# Patient Record
Sex: Male | Born: 1955
Health system: Southern US, Community
[De-identification: ages and names within clinical notes are randomized; demographics above are authoritative.]

## PROBLEM LIST (undated history)

## (undated) DIAGNOSIS — T792XXA Traumatic secondary and recurrent hemorrhage and seroma, initial encounter: Secondary | ICD-10-CM

## (undated) DIAGNOSIS — I714 Abdominal aortic aneurysm, without rupture, unspecified: Secondary | ICD-10-CM

## (undated) DIAGNOSIS — Z9889 Other specified postprocedural states: Secondary | ICD-10-CM

## (undated) DIAGNOSIS — I10 Essential (primary) hypertension: Secondary | ICD-10-CM

## (undated) HISTORY — DX: Essential (primary) hypertension: I10

## (undated) HISTORY — PX: CORONARY ARTERY BYPASS GRAFT: SHX141

## (undated) HISTORY — DX: Abdominal aortic aneurysm, without rupture: I71.4

## (undated) HISTORY — DX: Abdominal aortic aneurysm, without rupture, unspecified: I71.40

## (undated) HISTORY — PX: ABDOMINAL AORTIC ANEURYSM REPAIR: SUR1152

---

## 2019-06-18 ENCOUNTER — Emergency Department (HOSPITAL_COMMUNITY): Payer: Medicaid Other

## 2019-06-18 ENCOUNTER — Emergency Department (HOSPITAL_COMMUNITY)
Admission: EM | Admit: 2019-06-18 | Discharge: 2019-06-18 | Disposition: A | Discharge status: Home / Self Care | Payer: Medicaid Other | Attending: Emergency Medicine | Admitting: Emergency Medicine

## 2019-06-18 ENCOUNTER — Encounter (HOSPITAL_COMMUNITY): Payer: Self-pay

## 2019-06-18 ENCOUNTER — Other Ambulatory Visit: Payer: Self-pay

## 2019-06-18 DIAGNOSIS — R0789 Other chest pain: Secondary | ICD-10-CM | POA: Diagnosis present

## 2019-06-18 DIAGNOSIS — Z5321 Procedure and treatment not carried out due to patient leaving prior to being seen by health care provider: Secondary | ICD-10-CM | POA: Diagnosis not present

## 2019-06-18 HISTORY — DX: Other specified postprocedural states: Z98.890

## 2019-06-18 HISTORY — DX: Traumatic secondary and recurrent hemorrhage and seroma, initial encounter: T79.2XXA

## 2019-06-18 MED ORDER — SODIUM CHLORIDE 0.9% FLUSH: 3.0000 mL | Freq: Once | INTRAVENOUS | Status: DC

## 2019-06-18 NOTE — ED Triage Notes (Signed)
Pt here for chest pain and SOB that has been happening for the last few weeks. Pt is from [place] and recently moved here. Pt had an open heart surgery 10/10/2018

## 2019-06-18 NOTE — ED Notes (Signed)
Have advised pt that he is advised to stay and get checked out. States he must go take care of his family. Notified him he was next to go back and that we were cleaning his room. States he will come back later

## 2019-06-19 ENCOUNTER — Other Ambulatory Visit: Payer: Self-pay

## 2019-06-19 ENCOUNTER — Encounter (HOSPITAL_COMMUNITY): Payer: Self-pay

## 2019-06-19 ENCOUNTER — Emergency Department (HOSPITAL_COMMUNITY)
Admission: EM | Admit: 2019-06-19 | Discharge: 2019-06-19 | Disposition: A | Discharge status: Home / Self Care | Payer: Medicaid Other | Attending: Emergency Medicine | Admitting: Emergency Medicine

## 2019-06-19 ENCOUNTER — Emergency Department (HOSPITAL_COMMUNITY): Payer: Medicaid Other

## 2019-06-19 DIAGNOSIS — F17210 Nicotine dependence, cigarettes, uncomplicated: Secondary | ICD-10-CM | POA: Diagnosis not present

## 2019-06-19 DIAGNOSIS — R0602 Shortness of breath: Secondary | ICD-10-CM | POA: Insufficient documentation

## 2019-06-19 DIAGNOSIS — E876 Hypokalemia: Secondary | ICD-10-CM

## 2019-06-19 DIAGNOSIS — R079 Chest pain, unspecified: Secondary | ICD-10-CM | POA: Diagnosis not present

## 2019-06-19 LAB — CBC
HCT: 35.7 % — ABNORMAL LOW (ref 39.0–52.0)
Hemoglobin: 10.8 g/dL — ABNORMAL LOW (ref 13.0–17.0)
MCH: 29.3 pg (ref 26.0–34.0)
MCHC: 30.3 g/dL (ref 30.0–36.0)
MCV: 97 fL (ref 80.0–100.0)
Platelets: 202 10*3/uL (ref 150–400)
RBC: 3.68 MIL/uL — ABNORMAL LOW (ref 4.22–5.81)
RDW: 14.5 % (ref 11.5–15.5)
WBC: 6 10*3/uL (ref 4.0–10.5)
nRBC: 0 % (ref 0.0–0.2)

## 2019-06-19 LAB — BASIC METABOLIC PANEL
Anion gap: 9 (ref 5–15)
BUN: 16 mg/dL (ref 8–23)
CO2: 26 mmol/L (ref 22–32)
Calcium: 8.6 mg/dL — ABNORMAL LOW (ref 8.9–10.3)
Chloride: 105 mmol/L (ref 98–111)
Creatinine, Ser: 1.13 mg/dL (ref 0.61–1.24)
GFR calc Af Amer: >60 mL/min (ref >60)
GFR calc non Af Amer: >60 mL/min (ref >60)
Glucose, Bld: 101 mg/dL — ABNORMAL HIGH (ref 70–99)
Potassium: 3.1 mmol/L — ABNORMAL LOW (ref 3.5–5.1)
Sodium: 140 mmol/L (ref 135–145)

## 2019-06-19 LAB — TROPONIN I (HIGH SENSITIVITY)
Troponin I (High Sensitivity): 11 ng/L (ref <18)
Troponin I (High Sensitivity): 11 ng/L (ref <18)

## 2019-06-19 LAB — BRAIN NATRIURETIC PEPTIDE: B Natriuretic Peptide: 93 pg/mL (ref 0.0–100.0)

## 2019-06-19 LAB — MAGNESIUM: Magnesium: 2 mg/dL (ref 1.7–2.4)

## 2019-06-19 MED ORDER — POTASSIUM CHLORIDE CRYS ER 20 MEQ PO TBCR: 20.0000 meq | EXTENDED_RELEASE_TABLET | Freq: Every day | ORAL | 0 refills | Status: DC

## 2019-06-19 MED ORDER — ASPIRIN 81 MG PO CHEW
324.0000 mg | CHEWABLE_TABLET | Freq: Once | ORAL | Status: AC
  Administered 2019-06-19: 324 mg via ORAL
  Filled 2019-06-19: qty 4

## 2019-06-19 MED ORDER — SODIUM CHLORIDE 0.9% FLUSH: 3.0000 mL | Freq: Once | INTRAVENOUS | Status: DC

## 2019-06-19 MED ORDER — IOHEXOL 350 MG/ML SOLN
100.0000 mL | Freq: Once | INTRAVENOUS | Status: AC | PRN
  Administered 2019-06-19: 100 mL via INTRAVENOUS

## 2019-06-19 MED ORDER — POTASSIUM CHLORIDE CRYS ER 20 MEQ PO TBCR
40.0000 meq | EXTENDED_RELEASE_TABLET | Freq: Once | ORAL | Status: AC
  Administered 2019-06-19: 40 meq via ORAL
  Filled 2019-06-19: qty 2

## 2019-06-19 NOTE — ED Notes (Signed)
At  [facility] with someone in the Medical Records at [facility] in [place] an requested records from June/July 2020.

## 2019-06-19 NOTE — Discharge Instructions (Addendum)
Follow-up closely with cardiologist.  Return for worsening or persistent symptoms.  Take your medications as directed including potassium.

## 2019-06-19 NOTE — ED Notes (Signed)
Patient refuses COVID/SARS testing nasal swab.

## 2019-06-19 NOTE — ED Provider Notes (Signed)
[facility] EMERGENCY DEPARTMENT Provider Note   CSN: [id number] Arrival date & time: 06/19/19  8295     History   Chief Complaint Chief Complaint  Patient presents with   Chest Pain    HPI Connor [name] is a 63 y.o. male.     Patient with history of high blood pressure, aortic dissection last surgery in February at [facility], cigarette smoker presents with intermittent chest tightness and shortness of breath past 3 to 4 weeks.  Last episode was yesterday morning lasting approximately 2 hours.  Not specifically exertional.  At times worse with movement.  Not as significant as previous when he had a dissection.  Currently no significant symptoms.  Patient has history of renal disease when he was admitted/surgery.  Patient concerned potassium may be low as he ran out of his potassium medications.  Patient says has been taking his blood pressure medications.  No fever chills or cough.  No blood clot history no recent surgery, no unilateral leg swelling.  Patient has mild swelling both feet when he stands prolonged time.     Past Medical History:  Diagnosis Date   History of open heart surgery    Seroma due to trauma [facility])     There are no active problems to display for this patient.   History reviewed. No pertinent surgical history.      Home Medications    Prior to Admission medications   Medication Sig Start Date End Date Taking? Authorizing Provider  potassium chloride SA (KLOR-CON) 20 MEQ tablet Take 1 tablet (20 mEq total) by mouth daily. 06/19/19   Blane Ohara, MD    Family History No family history on file.  Social History Social History   Tobacco Use   Smoking status: Current Some Day Smoker   Smokeless tobacco: Never Used  Substance Use Topics   Alcohol use: Never    Frequency: Never   Drug use: Never     Allergies   Patient has no known allergies.   Review of Systems Review of Systems  Constitutional:  Negative for chills and fever.  HENT: Negative for congestion.   Eyes: Negative for visual disturbance.  Respiratory: Positive for shortness of breath. Negative for cough.   Cardiovascular: Positive for chest pain.  Gastrointestinal: Negative for abdominal pain and vomiting.  Genitourinary: Negative for dysuria and flank pain.  Musculoskeletal: Negative for back pain, neck pain and neck stiffness.  Skin: Negative for rash.  Neurological: Negative for light-headedness and headaches.     Physical Exam Updated Vital Signs BP (!) 152/91    Pulse 70    Temp 98.1 F (36.7 C) (Oral)    Resp 18    Ht  (1.803 m)    Wt 106 kg    SpO2 95%    BMI 32.59 kg/m   Physical Exam Vitals signs and nursing note reviewed.  Constitutional:      Appearance: He is well-developed.  HENT:     Head: Normocephalic and atraumatic.  Eyes:     General:        Right eye: No discharge.        Left eye: No discharge.     Conjunctiva/sclera: Conjunctivae normal.  Neck:     Musculoskeletal: Normal range of motion and neck supple.     Trachea: No tracheal deviation.  Cardiovascular:     Rate and Rhythm: Normal rate and regular rhythm.     Pulses:  Radial pulses are 2+ on the right side and 2+ on the left side.     Heart sounds: Murmur present. Systolic (aortic region) murmur present with a grade of 2/6.  Pulmonary:     Effort: Pulmonary effort is normal. No tachypnea.     Breath sounds: Normal breath sounds. No decreased breath sounds.  Abdominal:     General: There is no distension.     Palpations: Abdomen is soft.     Tenderness: There is no abdominal tenderness. There is no guarding.  Musculoskeletal:     Right lower leg: No edema.     Left lower leg: No edema.  Skin:    General: Skin is warm.     Findings: No rash.  Neurological:     Mental Status: He is alert and oriented to person, place, and time.      ED Treatments / Results  Labs (all labs ordered are listed, but only  abnormal results are displayed) Labs Reviewed  BASIC METABOLIC PANEL - Abnormal; Notable for the following components:      Result Value   Potassium 3.1 (*)    Glucose, Bld 101 (*)    Calcium 8.6 (*)    All other components within normal limits  CBC - Abnormal; Notable for the following components:   RBC 3.68 (*)    Hemoglobin 10.8 (*)    HCT 35.7 (*)    All other components within normal limits  SARS CORONAVIRUS 2 (TAT 6-24 HRS)  MAGNESIUM  BRAIN NATRIURETIC PEPTIDE  TROPONIN I (HIGH SENSITIVITY)  TROPONIN I (HIGH SENSITIVITY)    EKG None EKG reviewed heart rate 80, incomplete right bundle branch block, normal QT, sinus.  No acute ST elevation. Radiology Dg Chest 2 View  Result Date: 06/18/2019 CLINICAL DATA:  Chest pain EXAM: CHEST - 2 VIEW COMPARISON:  None. FINDINGS: No prior studies are available for comparison. Post sternotomy changes. Vascular stent in the region of aortic arch. 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 Clips in the right axillary region. Mild cardiomegaly. No pleural effusion, pneumothorax or focal airspace disease IMPRESSION: 1. Postsurgical changes of the mediastinum. 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 Given history of chest pain and absence of prior exams for comparison, recommend CT angiography for further evaluation. 2. Cardiomegaly with vascular congestion. Electronically Signed   By: Jasmine PangKim  Fujinaga M.D.   On: 06/18/2019 17:26   Dg Chest Portable 1 View  Result Date: 06/19/2019 CLINICAL DATA:  Chest pain. EXAM: PORTABLE CHEST 1 VIEW COMPARISON:  June 18, 2019. FINDINGS: Stable cardiomegaly. Sternotomy wires are noted. Vascular stent is again noted in region of aortic arch. No pneumothorax or pleural effusion is noted. No acute pulmonary disease is noted. Bony thorax is unremarkable. IMPRESSION: Postsurgical changes as described above. Stable cardiomegaly. No acute cardiopulmonary  abnormality seen. Electronically Signed   By: Lupita RaiderJames  Green Jr M.D.   On: 06/19/2019 08:13   Ct Angio Chest/abd/pel For Dissection W And/or Wo Contrast  Result Date: 06/19/2019 CLINICAL DATA:  History of aortic dissection surgical repair. Chest pain and shortness of breath intermittently last month. EXAM: CT ANGIOGRAPHY CHEST, ABDOMEN AND PELVIS TECHNIQUE: Multidetector CT imaging through the chest, abdomen and pelvis was performed using the standard protocol during bolus administration of intravenous contrast. Multiplanar reconstructed images and MIPs were obtained and reviewed to evaluate the vascular anatomy. CONTRAST:  100mL OMNIPAQUE IOHEXOL 350 MG/ML SOLN COMPARISON:  None. FINDINGS: CTA CHEST FINDINGS Cardiovascular:  Patient is status post complex aortic dissection repair. Specifically, the patient has undergone straight graft repair of the ascending aorta. Postop changes noted about the graft along the ascending aorta without evidence of acute mediastinal hemorrhage, hematoma, or pericardial effusion. Normal heart size. Three-vessel arch anatomy is tortuous but appears patent. Proximal descending thoracic aorta distal to the left subclavian artery has a stent graft extending over a short segment. The stent graft appears patent with a trace amount linear hypodense filling defect along the proximal stent tines, images 27 through 31. This is indeterminate for a small residual intimal dissection versus minor amount of strandy linear thrombus. The adjacent false lumen along the stent graft is largely thrombosed. Inferior to the stent graft segment, the residual dissection extends from the mid descending thoracic aorta into the abdomen. Both the true and false lumens have patency. The true lumen is smaller and medial. This extends through the diaphragmatic hiatus into the abdominal aorta. Mediastinum/Nodes: No supraclavicular or axillary adenopathy. Mildly enlarged pretracheal lymph nodes, nonspecific. No  subcarinal or bulky hilar adenopathy. Thyroid, trachea and esophagus unremarkable. Esophagus is nondilated. No hiatal hernia. Lungs/Pleura: Minor dependent basilar atelectasis. No focal pneumonia, collapse or consolidation. No interstitial process or edema. No other pleural abnormality, pleural effusion or pneumothorax. Trachea central airways remain patent. Musculoskeletal: Postop changes of the right anterior chest with asymmetry/atrophy of the right pectoralis musculature. Median sternotomy changes noted. Symmetric gynecomastia evident. Minor degenerative changes of the spine. No acute thoracic spine finding. Review of the MIP images confirms the above findings. CTA ABDOMEN AND PELVIS FINDINGS VASCULAR Aorta: Residual type B dissection involves the entire abdominal aorta to the bifurcation. True and false lumens remain patent. Smaller lumen is the true lumen. No acute occlusive process, thrombus, retroperitoneal hemorrhage or hematoma. No evidence of rupture. Celiac: Dissection extends into the celiac origin. Celiac remains patent including its branches. SMA: Dissection extends into the SMA origin. SMA remains patent including its branches. Renals: Patent right renal artery off of the true lumen. Left renal artery elongated stent extends from the smaller true lumen through the false lumen into the left renal artery origin and appears patent. No perfusion abnormality or renal hypoperfusion demonstrated. IMA: IMA origin is patent off of the false lumen. IMA branches are not well visualized distally. Inflow: Patent stents of the left common iliac artery extending off the true lumen of the aorta. There is a patent left external iliac stent as well. Dissection extends into the left internal iliac artery which remains patent. No iliac inflow disease, occlusion, thrombus or acute vascular finding. Veins: Dedicated venous imaging not performed. Review of the MIP images confirms the above findings. NON-VASCULAR  Hepatobiliary: No large focal hepatic abnormality. Gallbladder is collapsed but contains small calcified stones. No biliary dilatation or obstruction. Common bile duct nondilated. Pancreas: Unremarkable. No pancreatic ductal dilatation or surrounding inflammatory changes. Spleen: Normal in size without focal abnormality. Adrenals/Urinary Tract: Symmetric adrenal glands. No hydronephrosis or acute obstructive uropathy. No hydroureter or ureteral calculus. Bladder unremarkable. Stomach/Bowel: Negative for bowel obstruction, significant dilatation, ileus, or free air. Scattered minor colonic diverticulosis. Appendix. No free fluid, fluid collection, hemorrhage, hematoma, or abscess. Lymphatic: No bulky adenopathy. Reproductive: Mild prostate enlargement. Other: Intact abdominal wall. Postop changes of the inguinal regions bilaterally extensive scarring left inguinal region and prominent mildly enlarged lymph nodes. Musculoskeletal: Minor degenerative changes. No acute osseous finding. No compression fracture. Review of the MIP images confirms the above findings. IMPRESSION: Complex operative aortic dissection repair with a patent straight  graft of the ascending aortic component. Patient also has a patent stent graft of the proximal descending thoracic aorta with a small linear intra stent graft lumen strandy soft tissue density compatible with minor residual intimal flap versus strandy nonocclusive thrombus. There is thrombosis of the proximal descending thoracic aortic dissection false lumen along the stent graft. There is a patent residual type B aortic dissection of the mid descending thoracic aorta extending to the abdominal aortic bifurcation. No definitive evidence of acute vascular process or injury by CTA. Preserved patency of the mesenteric and renal vasculature despite being involved by the residual type B dissection. No evidence of aortoiliac acute vascular occlusive process. No other acute intrathoracic or  abdominopelvic finding by CTA. These results were called by telephone at the time of interpretation on 06/19/2019 at 11 a.m. to provider Elnora Morrison , who verbally acknowledged these results. Electronically Signed   By: Jerilynn Mages.  Shick M.D.   On: 06/19/2019 12:07    Procedures Procedures (including critical care time)  Medications Ordered in ED Medications  sodium chloride flush (NS) 0.9 % injection 3 mL (has no administration in time range)  iohexol (OMNIPAQUE) 350 MG/ML injection 100 mL (100 mLs Intravenous Contrast Given 06/19/19 1030)  potassium chloride SA (KLOR-CON) CR tablet 40 mEq (40 mEq Oral Given 06/19/19 1139)  aspirin chewable tablet 324 mg (324 mg Oral Given 06/19/19 1139)     Initial Impression / Assessment and Plan / ED Course  I have reviewed the triage vital signs and the nursing notes.  Pertinent labs & imaging results that were available during my care of the patient were reviewed by me and considered in my medical decision making (see chart for details).       Patient presents with intermittent chest tightness and shortness of breath with aortic dissection history.  Patient does have risk for triggers for ACS and also known dissection.  Plan for blood work to check potassium and general blood work especially with patient being on multiple blood pressure medications and missing potassium recently.  Plan check troponin with intermittent chest tightness given his age, high blood pressure history and smoking history.  Currently no significant symptoms.  Shortness of breath corona test added however patient refused.  Patient has history of renal disease we will see what his kidney function is to determine if we can do a CT angiogram today.  Troponin test negative.  Potassium mild low 3.1, oral replacement given.  Patient well-appearing on reassessment no chest pain or shortness of breath.  Blood pressure mild elevated.  Discussed CT results with radiology and no acute findings of  his old dissection repair.  Discussed with cardiology who will see the patient on Wednesday for appointment.  Patient comfortable this plan and reasons to return given.  Patient is on aspirin daily.   Final Clinical Impressions(s) / ED Diagnoses   Final diagnoses:  Acute chest pain  Hypokalemia    ED Discharge Orders         Ordered    potassium chloride SA (KLOR-CON) 20 MEQ tablet  Daily     06/19/19 1230           Elnora Morrison, MD 06/19/19 1233

## 2019-06-19 NOTE — ED Notes (Signed)
Jarrett Soho with Cardiology called with appt. For Wed. Oct. 28th at 0940 with Dr. Harl Bowie. Info given to Dr. Reather Converse.

## 2019-06-19 NOTE — ED Triage Notes (Signed)
Pt reports intermittent chest pain and sob x 1 month.  Denies symptoms at this time.

## 2019-06-24 ENCOUNTER — Ambulatory Visit: Payer: Medicaid Other | Admitting: Cardiology

## 2019-06-24 NOTE — Progress Notes (Deleted)
Clinical Summary Mr. Dieujuste is a 63 y.o.male seen as new patient today for chest pain  1. Chest pain - ER visit 05/2019 with chest pain - trop neg x 2 with no delta. EKG SR no specific ischemic changes - CTA shows prior complex repair as reported below but no acute pathology. - 10/2018 echo LVEF 60-65%   2. History of aortic dissection - records indicated he had a type A dissection wit repair in 2016, admitted 07/2018 with recurrent dissection at the anastamosis suture line later requiring total arch repair.  - prior surgery at [facility] in [facility]  3. PAD - history of left common and external iliac stents   4. HTN Past Medical History:  Diagnosis Date   History of open heart surgery    Seroma due to trauma ([place])      No Known Allergies   Current Outpatient Medications  Medication Sig Dispense Refill   potassium chloride SA (KLOR-CON) 20 MEQ tablet Take 1 tablet (20 mEq total) by mouth daily. 7 tablet 0   No current facility-administered medications for this visit.      No past surgical history on file.   No Known Allergies    No family history on file.   Social History Mr. Iglesia reports that he has been smoking. He has never used smokeless tobacco. Mr. Troung reports no history of alcohol use.   Review of Systems CONSTITUTIONAL: No weight loss, fever, chills, weakness or fatigue.  HEENT: Eyes: No visual loss, blurred vision, double vision or yellow sclerae.No hearing loss, sneezing, congestion, runny nose or sore throat.  SKIN: No rash or itching.  CARDIOVASCULAR:  RESPIRATORY: No shortness of breath, cough or sputum.  GASTROINTESTINAL: No anorexia, nausea, vomiting or diarrhea. No abdominal pain or blood.  GENITOURINARY: No burning on urination, no polyuria NEUROLOGICAL: No headache, dizziness, syncope, paralysis, ataxia, numbness or tingling in the extremities. No change in bowel or bladder control.  MUSCULOSKELETAL: No muscle,  back pain, joint pain or stiffness.  LYMPHATICS: No enlarged nodes. No history of splenectomy.  PSYCHIATRIC: No history of depression or anxiety.  ENDOCRINOLOGIC: No reports of sweating, cold or heat intolerance. No polyuria or polydipsia.  Marland Kitchen   Physical Examination There were no vitals filed for this visit. There were no vitals filed for this visit.  Gen: resting comfortably, no acute distress HEENT: no scleral icterus, pupils equal round and reactive, no palptable cervical adenopathy,  CV Resp: Clear to auscultation bilaterally GI: abdomen is soft, non-tender, non-distended, normal bowel sounds, no hepatosplenomegaly MSK: extremities are warm, no edema.  Skin: warm, no rash Neuro:  no focal deficits Psych: appropriate affect   Diagnostic Studies   05/2019 CTA chest  IMPRESSION: Complex operative aortic dissection repair with a patent straight graft of the ascending aortic component. Patient also has a patent stent graft of the proximal descending thoracic aorta with a small linear intra stent graft lumen strandy soft tissue density compatible with minor residual intimal flap versus strandy nonocclusive thrombus. There is thrombosis of the proximal descending thoracic aortic dissection false lumen along the stent graft. There is a patent residual type B aortic dissection of the mid descending thoracic aorta extending to the abdominal aortic bifurcation.  No definitive evidence of acute vascular process or injury by CTA.  Preserved patency of the mesenteric and renal vasculature despite being involved by the residual type B dissection. No evidence of aortoiliac acute vascular occlusive process.  No other acute intrathoracic or abdominopelvic  finding by CTA.  These results were called by telephone at the time of interpretation on 06/19/2019 at 11 a.m. to provider Blane Ohara , who verbally  Assessment and Plan        Antoine Poche, M.D., F.A.C.C.

## 2019-07-10 ENCOUNTER — Encounter: Payer: Self-pay | Admitting: Cardiology

## 2019-07-10 NOTE — Telephone Encounter (Signed)
error 

## 2019-07-27 ENCOUNTER — Ambulatory Visit: Payer: Medicaid Other | Admitting: Cardiology

## 2019-08-07 ENCOUNTER — Encounter (INDEPENDENT_AMBULATORY_CARE_PROVIDER_SITE_OTHER): Payer: Self-pay

## 2019-08-07 ENCOUNTER — Other Ambulatory Visit: Payer: Self-pay

## 2019-08-07 ENCOUNTER — Ambulatory Visit: Payer: Medicaid Other | Admitting: Cardiology

## 2019-08-07 VITALS — BP 124/74 | HR 63 | Ht 71.0 in | Wt 236.2 lb

## 2019-08-07 DIAGNOSIS — I480 Paroxysmal atrial fibrillation: Secondary | ICD-10-CM | POA: Diagnosis not present

## 2019-08-07 DIAGNOSIS — I82622 Acute embolism and thrombosis of deep veins of left upper extremity: Secondary | ICD-10-CM

## 2019-08-07 DIAGNOSIS — I10 Essential (primary) hypertension: Secondary | ICD-10-CM

## 2019-08-07 DIAGNOSIS — K0889 Other specified disorders of teeth and supporting structures: Secondary | ICD-10-CM

## 2019-08-07 DIAGNOSIS — I7100 Dissection of unspecified site of aorta: Secondary | ICD-10-CM

## 2019-08-07 MED ORDER — LABETALOL HCL 300 MG PO TABS: 300.0000 mg | ORAL_TABLET | Freq: Two times a day (BID) | ORAL | 3 refills | Status: DC

## 2019-08-07 NOTE — Progress Notes (Signed)
Cardiology Office Note:    Date:  08/07/2019   ID:  Connor [name], DOB [birth year], MRN [id number]  PCP:  Patient, No Pcp Per  Cardiologist:  No primary care provider on file.  Electrophysiologist:  None   Referring MD: No ref. provider found   Chief Complaint  Patient presents with  . Thoracic Aortic Dissection    History of Present Illness:    Connor SequinRobert Sircy is a 63 y.o. 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  He underwent left common and external iliac artery and left renal vein stents on 09/19/2018 in preparation for total aortic arch repair.  Course was complicated by cardiogenic shock (LVEF down to 15%, which has since recovered), AKI requiring CVVH (with subsequent recovery of renal function), extensive left upper extremity DVT.  On 10/03/2018, he underwent total arch replacement, frozen elephant trunk, and head vessel reconstruction with Dr.Takayama and Dr. Allena KatzPatel.  Course was complicated by sternal wound infection, with cultures growing Klebsiella.  He completed a 14-day course of cefadroxil.  He was following closely with cardiology and nephrology in [place] for blood pressure control.  Moved to [place] in July to help with a family issue.   He was in the ED on 06/19/2019 with chest tightness and shortness of breath.  Troponins negative x2.  CTA chest showed patent graft of the ascending aortic component, patent graft of the proximal descending aorta with, thrombosis of the proximal descending thoracic aortic dissection false lumen along the stent graft, patent type B aortic dissection of the mid descending thoracic aorta extending to the abdominal aortic bifurcation.  Reports that he feels chest pain has been related to muscle cramps from low potassium.  Now taking potassium supplements.  States that he also has chest pain across the incision in the right upper chest when he  does stretches.  He reports that he saw his dentist about having a tooth extraction done, but was canceled because he was on Eliquis.     Past Medical History:  Diagnosis Date  . History of open heart surgery   . Seroma due to trauma [facility](HCC)     No past surgical history on file.  Current Medications: Current Meds  Medication Sig  . ASPIRIN LOW DOSE 81 MG EC tablet Take 81 mg by mouth daily.  . cloNIDine (CATAPRES - DOSED IN MG/24 HR) 0.2 mg/24hr patch Place 0.2 mg onto the skin once a week.  . doxazosin (CARDURA) 2 MG tablet Take 2 mg by mouth daily.  Marland Kitchen. ELIQUIS 5 MG TABS tablet Take 5 mg by mouth daily.   Marland Kitchen. NIFEdipine (ADALAT CC) 90 MG 24 hr tablet Take 90 mg by mouth daily.  . potassium chloride SA (KLOR-CON) 20 MEQ tablet Take 40 mEq by mouth daily.  Marland Kitchen. spironolactone (ALDACTONE) 25 MG tablet Take 25 mg by mouth daily.     Allergies:   Patient has no known allergies.   Social History   Socioeconomic History  . Marital status: Single    Spouse name: Not on file  . Number of children: Not on file  . Years of education: Not on file  . Highest education level: Not on file  Occupational History  . Not on file  Tobacco Use  . Smoking status: Current Some Day Smoker  . Smokeless tobacco: Never Used  Substance and Sexual Activity  . Alcohol use: Never  .  Drug use: Never  . Sexual activity: Not on file  Other Topics Concern  . Not on file  Social History Narrative  . Not on file   Social Determinants of Health   Financial Resource Strain:   . Difficulty of Paying Living Expenses: Not on file  Food Insecurity:   . Worried About Charity fundraiser in the Last Year: Not on file  . Ran Out of Food in the Last Year: Not on file  Transportation Needs:   . Lack of Transportation (Medical): Not on file  . Lack of Transportation (Non-Medical): Not on file  Physical Activity:   . Days of Exercise per Week: Not on file  . Minutes of Exercise per Session: Not on file  Stress:    . Feeling of Stress : Not on file  Social Connections:   . Frequency of Communication with Friends and Family: Not on file  . Frequency of Social Gatherings with Friends and Family: Not on file  . Attends Religious Services: Not on file  . Active Member of Clubs or Organizations: Not on file  . Attends Archivist Meetings: Not on file  . Marital Status: Not on file     Family History: Mother died of MI at age 26.  Father had stroke at age 43  ROS:   Please see the history of present illness.     All other systems reviewed and are negative.  EKGs/Labs/Other Studies Reviewed:    The following studies were reviewed today:   EKG:  EKG is  ordered today.  The ekg ordered today demonstrates normal sinus rhythm, first-degree AV block, rate 73, left axis deviation, no ST/T abnormalities  Recent Labs: 06/19/2019: B Natriuretic Peptide 93.0; BUN 16; Creatinine, Ser 1.13; Hemoglobin 10.8; Magnesium 2.0; Platelets 202; Potassium 3.1; Sodium 140  Recent Lipid Panel No results found for: CHOL, TRIG, HDL, CHOLHDL, VLDL, LDLCALC, LDLDIRECT  Physical Exam:    VS:  BP 124/74   Pulse 63   Ht 5\' 11"  (1.803 m)   Wt 236 lb 3.2 oz (107.1 kg)   SpO2 99%   BMI 32.94 kg/m     Wt Readings from Last 3 Encounters:  08/07/19 236 lb 3.2 oz (107.1 kg)  06/19/19 233 lb 11 oz (106 kg)     GEN: Well nourished, well developed in no acute distress HEENT: Normal NECK: No JVD; No carotid bruits LYMPHATICS: No lymphadenopathy CARDIAC: RRR, no murmurs, rubs, gallops.  Chest incisions appear clean, dry, and intact RESPIRATORY:  Clear to auscultation without rales, wheezing or rhonchi  ABDOMEN: Soft, non-tender, non-distended MUSCULOSKELETAL:  No edema; No deformity  SKIN: Warm and dry NEUROLOGIC:  Alert and oriented x 3 PSYCHIATRIC:  Normal affect   ASSESSMENT:    1. Dissection of aorta, unspecified portion of aorta (HCC)   2. Pain, dental   3. PAF (paroxysmal atrial fibrillation) ([place])    4. Deep vein thrombosis (DVT) of left upper extremity, unspecified chronicity, unspecified vein (HCC)   5. Essential hypertension    PLAN:    Aortic dissection: Type A aortic arch dissection status post repair in 2016 with dissection at anastomosis suture lines in 07/2018 status post left common and external iliac artery and left renal vein stents on 09/19/2018 and then total arch replacement, frozen elephant trunk, head vessel reconstruction on 10/03/2018 in [place] multi-staged reconstruction of entire aorta in [place].  Patient requesting a second opinion on surgery, will refer to cardiothoracic surgery -Increase labetalol  to 300 mg twice daily.  Goal SBP less than 120, heart rate less than 60.  Asked patient to monitor his blood pressure and heart rate daily for next 2 weeks and call with results  Hypertension: Currently on doxazosin 2 mg daily, nifedipine 90 mg daily, spironolactone 25 mg daily, and labetalol 200 mg twice daily.  BP above goal less than SBP 120.  Will increase labetalol to 300 mg twice daily as above  PAF: Currently in sinus rhythm.  CHADS-VASc score 2 given HTN, vascular disease.  Continue Eliquis.  He has only been taking 5 mg once a day, advised dosing should be twice daily  LUE DVT: Occurred following surgery in 08/2018.  Has been on Eliquis 5 mg, advised to take twice daily as above  Dental pain: Planning tooth extraction with his dentist, requesting Eliquis to be held.  Tooth extraction can usually be done on anticoagulation, but can be held if needed given has been nearly 1 year since DVT and low CHADS-VASc score for PAF.  Would hold for 24 hours prior to procedure and resume as soon as OK from bleeding standpoint post procedure.  RTC in 1 month  Medication Adjustments/Labs and Tests Ordered: Current medicines are reviewed at length with the patient today.  Concerns regarding medicines are outlined above.  Orders Placed This Encounter  Procedures  .  Ambulatory referral to Cardiothoracic Surgery  . EKG 12-Lead   Meds ordered this encounter  Medications  . labetalol (NORMODYNE) 300 MG tablet    Sig: Take 1 tablet (300 mg total) by mouth 2 (two) times daily.    Dispense:  180 tablet    Refill:  3    Patient Instructions  Medication Instructions:  Your physician has recommended you make the following change in your medication:  1. Increase labetalol one tablet (300 mg ) twice daily, sent in # 90 to requested pharmacy.   *If you need a refill on your cardiac medications before your next appointment, please call your pharmacy*  Lab Work: -None If you have labs (blood work) drawn today and your tests are completely normal, you will receive your results only by: Marland Kitchen MyChart Message (if you have MyChart) OR . A paper copy in the mail If you have any lab test that is abnormal or we need to change your treatment, we will call you to review the results.  Testing/Procedures: -None    Follow-Up:  You have been referred to [facility].   At [facility], you and your health needs are our priority.  As part of our continuing mission to provide you with exceptional heart care, we have created designated Provider Care Teams.  These Care Teams include your primary Cardiologist (physician) and Advanced Practice Providers (APPs -  Physician Assistants and Nurse Practitioners) who all work together to provide you with the care you need, when you need it.  Your next appointment:   1 month(s)  The format for your next appointment:   In Person  Provider:   Epifanio Lesches, MD  Other Instructions Requested medical records from Dr. Dianna Rossetti and [facility].       Signed, Little Ishikawa, MD  08/07/2019 5:24 PM    [facility]

## 2019-08-07 NOTE — Patient Instructions (Signed)
Medication Instructions:  Your physician has recommended you make the following change in your medication:  1. Increase labetalol one tablet (300 mg ) twice daily, sent in # 90 to requested pharmacy.   *If you need a refill on your cardiac medications before your next appointment, please call your pharmacy*  Lab Work: -None If you have labs (blood work) drawn today and your tests are completely normal, you will receive your results only by: Marland Kitchen MyChart Message (if you have MyChart) OR . A paper copy in the mail If you have any lab test that is abnormal or we need to change your treatment, we will call you to review the results.  Testing/Procedures: -None    Follow-Up:  You have been referred to [facility].   At [facility], you and your health needs are our priority.  As part of our continuing mission to provide you with exceptional heart care, we have created designated Provider Care Teams.  These Care Teams include your primary Cardiologist (physician) and Advanced Practice Providers (APPs -  Physician Assistants and Nurse Practitioners) who all work together to provide you with the care you need, when you need it.  Your next appointment:   1 month(s)  The format for your next appointment:   In Person  Provider:   Oswaldo Milian, MD  Other Instructions Requested medical records from Dr. Meryl Crutch and [facility].

## 2019-08-10 ENCOUNTER — Telehealth: Payer: Self-pay

## 2019-08-10 NOTE — Telephone Encounter (Signed)
Attempted to contact pt to inform of MD's recommendations. Left message to call back.

## 2019-08-10 NOTE — Telephone Encounter (Signed)
Opened in error

## 2019-08-10 NOTE — Telephone Encounter (Signed)
-----   Message from Donato Heinz, MD sent at 08/07/2019  5:26 PM EST ----- We received records after patient left.  I tried calling him but unable to reach.  Could we let him know that his Eliquis dose should be 5 mg twice daily (he is only taking once daily).  Also, he is OK to hold his Eliquis for teeth extraction (can hold for 24 hours prior to procedure and resume as soon as OK from bleeding standpoint post-procedure)

## 2019-08-11 MED ORDER — ELIQUIS 5 MG PO TABS: 5.0000 mg | ORAL_TABLET | Freq: Two times a day (BID) | ORAL | 3 refills | Status: DC

## 2019-08-11 NOTE — Telephone Encounter (Signed)
Pt updated and verbalized understanding.  

## 2019-08-17 ENCOUNTER — Other Ambulatory Visit: Payer: Self-pay

## 2019-08-17 ENCOUNTER — Emergency Department (HOSPITAL_COMMUNITY)
Admission: EM | Admit: 2019-08-17 | Discharge: 2019-08-17 | Discharge status: Against Medical Advice | Payer: Medicaid Other

## 2019-08-18 ENCOUNTER — Telehealth: Payer: Self-pay | Admitting: Cardiology

## 2019-08-18 NOTE — Telephone Encounter (Signed)
*  STAT* If patient is at the pharmacy, call can be transferred to refill team.   1. Which medications need to be refilled? (please list name of each medication and dose if known) ASPIRIN LOW DOSE 81 MG EC tablet  cloNIDine (CATAPRES - DOSED IN MG/24 HR) 0.2 mg/24hr patch doxazosin (CARDURA) 2 MG tablet  NIFEdipine (ADALAT CC) 90 MG 24 hr tablet  spironolactone (ALDACTONE) 25 MG tablet  2. Which pharmacy/location (including street and city if local pharmacy) is medication to be sent to? [id number] - [place], [address]  3. Do they need a 30 day or 90 day supply? 90 day

## 2019-08-24 ENCOUNTER — Other Ambulatory Visit: Payer: Self-pay

## 2019-08-24 MED ORDER — DOXAZOSIN MESYLATE 2 MG PO TABS: 2.0000 mg | ORAL_TABLET | Freq: Every day | ORAL | 2 refills | Status: DC

## 2019-08-24 MED ORDER — NIFEDIPINE ER 90 MG PO TB24: 90.0000 mg | ORAL_TABLET | Freq: Every day | ORAL | 2 refills | Status: DC

## 2019-09-07 ENCOUNTER — Other Ambulatory Visit: Payer: Self-pay | Admitting: *Deleted

## 2019-09-07 MED ORDER — POTASSIUM CHLORIDE CRYS ER 20 MEQ PO TBCR: 40.0000 meq | EXTENDED_RELEASE_TABLET | Freq: Every day | ORAL | 2 refills | Status: DC

## 2019-09-07 NOTE — Progress Notes (Signed)
Cardiology Office Note:    Date:  09/09/2019   ID:  [name], DOB 1956-07-05, MRN [id number]  PCP:  Patient, No Pcp Per  Cardiologist:  No primary care provider on file.  Electrophysiologist:  None   Referring MD: No ref. provider found   Chief Complaint  Patient presents with  . Hypertension    History of Present Illness:    Connor [name] is a 64 y.o. male with a hx of aortic dissection who presents for follow-up.  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  He underwent left common and external iliac artery and left renal vein stents on 09/19/2018 in preparation for total aortic arch repair.  Course was complicated by cardiogenic shock (LVEF down to 15%, which has since recovered), AKI requiring CVVH (with subsequent recovery of renal function), extensive left upper extremity DVT.  On 10/03/2018, he underwent total arch replacement, frozen elephant trunk, and head vessel reconstruction with Dr.Takayama and Dr. Allena Katz.  Course was complicated by sternal wound infection, with cultures growing Klebsiella.  He completed a 14-day course of cefadroxil.  He was following closely with cardiology and nephrology in [place] for blood pressure control.  Moved to [place] in July to help with a family issue.   He was in the ED on 06/19/2019 with chest tightness and shortness of breath.  Troponins negative x2.  CTA chest showed patent graft of the ascending aortic component, patent graft of the proximal descending aorta with, thrombosis of the proximal descending thoracic aortic dissection false lumen along the stent graft, patent type B aortic dissection of the mid descending thoracic aorta extending to the abdominal aortic bifurcation.  Reports that he feels chest pain has been related to muscle cramps from low potassium.  Now taking potassium supplements.  States that he also has chest pain across the  incision in the right upper chest when he does stretches.  He reports that he saw his dentist about having a tooth extraction done, but was canceled because he was on Eliquis.    Since his last clinic visit, he has not had his tooth extraction done yet.  Reports he intermittently checks blood pressure at home but has not brought his log with him.  Reports BP has been 130s to 140s at home.  Reports he has been off his potassium for 2 days.   Past Medical History:  Diagnosis Date  . History of open heart surgery   . Seroma due to trauma [facility])     No past surgical history on file.  Current Medications: Current Meds  Medication Sig  . ASPIRIN LOW DOSE 81 MG EC tablet Take 81 mg by mouth daily.  Marland Kitchen doxazosin (CARDURA) 2 MG tablet Take 1 tablet (2 mg total) by mouth daily.  Marland Kitchen ELIQUIS 5 MG TABS tablet Take 1 tablet (5 mg total) by mouth 2 (two) times daily.  Marland Kitchen labetalol (NORMODYNE) 200 MG tablet Take 2 tablets (400 mg total) by mouth 2 (two) times daily.  Marland Kitchen NIFEdipine (ADALAT CC) 90 MG 24 hr tablet Take 1 tablet (90 mg total) by mouth daily.  . potassium chloride SA (KLOR-CON) 20 MEQ tablet Take 2 tablets (40 mEq total) by mouth daily.  Marland Kitchen spironolactone (ALDACTONE) 25 MG tablet Take 25 mg by mouth daily.  . [DISCONTINUED] cloNIDine (CATAPRES - DOSED IN MG/24 HR) 0.2 mg/24hr patch Place 0.2 mg onto the skin once a  week.  . [DISCONTINUED] cloNIDine (CATAPRES - DOSED IN MG/24 HR) 0.3 mg/24hr patch Place 1 patch onto the skin once a week.  . [DISCONTINUED] labetalol (NORMODYNE) 300 MG tablet Take 1 tablet (300 mg total) by mouth 2 (two) times daily.     Allergies:   Patient has no known allergies.   Social History   Socioeconomic History  . Marital status: Single    Spouse name: Not on file  . Number of children: Not on file  . Years of education: Not on file  . Highest education level: Not on file  Occupational History  . Not on file  Tobacco Use  . Smoking status: Current Some Day  Smoker  . Smokeless tobacco: Never Used  Substance and Sexual Activity  . Alcohol use: Never  . Drug use: Never  . Sexual activity: Not on file  Other Topics Concern  . Not on file  Social History Narrative  . Not on file   Social Determinants of Health   Financial Resource Strain:   . Difficulty of Paying Living Expenses: Not on file  Food Insecurity:   . Worried About Charity fundraiser in the Last Year: Not on file  . Ran Out of Food in the Last Year: Not on file  Transportation Needs:   . Lack of Transportation (Medical): Not on file  . Lack of Transportation (Non-Medical): Not on file  Physical Activity:   . Days of Exercise per Week: Not on file  . Minutes of Exercise per Session: Not on file  Stress:   . Feeling of Stress : Not on file  Social Connections:   . Frequency of Communication with Friends and Family: Not on file  . Frequency of Social Gatherings with Friends and Family: Not on file  . Attends Religious Services: Not on file  . Active Member of Clubs or Organizations: Not on file  . Attends Archivist Meetings: Not on file  . Marital Status: Not on file     Family History: Mother died of MI at age 2.  Father had stroke at age 74  ROS:   Please see the history of present illness.     All other systems reviewed and are negative.  EKGs/Labs/Other Studies Reviewed:    The following studies were reviewed today:   EKG:  EKG is  ordered today.  The ekg ordered today demonstrates normal sinus rhythm, first-degree AV block, rate 73, left axis deviation, no ST/T abnormalities  Recent Labs: 06/19/2019: B Natriuretic Peptide 93.0; Hemoglobin 10.8; Magnesium 2.0; Platelets 202 09/08/2019: ALT 8; BUN 11; Creatinine, Ser 1.29; NT-Pro BNP 719; Potassium 3.7; Sodium 144  Recent Lipid Panel No results found for: CHOL, TRIG, HDL, CHOLHDL, VLDL, LDLCALC, LDLDIRECT  Physical Exam:    VS:  BP (!) 145/82   Pulse 82   Ht 5\' 11"  (1.803 m)   Wt 243 lb 3.2  oz (110.3 kg)   SpO2 99%   BMI 33.92 kg/m     Wt Readings from Last 3 Encounters:  09/08/19 243 lb 3.2 oz (110.3 kg)  08/07/19 236 lb 3.2 oz (107.1 kg)  06/19/19 233 lb 11 oz (106 kg)     GEN: Well nourished, well developed in no acute distress HEENT: Normal NECK: No JVD; No carotid bruits LYMPHATICS: No lymphadenopathy CARDIAC: RRR, 3/6 systolic murmur RESPIRATORY:  Clear to auscultation without rales, wheezing or rhonchi  ABDOMEN: Soft, non-tender, non-distended MUSCULOSKELETAL:  No edema; No deformity  SKIN: Warm and  dry NEUROLOGIC:  Alert and oriented x 3 PSYCHIATRIC:  Normal affect   ASSESSMENT:    1. Resistant hypertension   2. Edema, unspecified type   3. Medication management   4. PAF (paroxysmal atrial fibrillation) (HCC)   5. Pain, dental   6. Dissection of aorta, unspecified portion of aorta (HCC)    PLAN:    Aortic dissection: Type A aortic arch dissection status post repair in 2016 with dissection at anastomosis suture lines in 07/2018 status post left common and external iliac artery and left renal vein stents on 09/19/2018 and then total arch replacement, frozen elephant trunk, head vessel reconstruction on 10/03/2018 in [place] -Planning multi-staged reconstruction of entire aorta in [place].  Patient requesting a second opinion on surgery, have referred to surgery here -Increase labetalol to 400 mg twice daily.  Goal SBP less than 120, heart rate less than 60.  Asked patient to monitor his blood pressure and heart rate daily for next 2 weeks and call with results  Hypertension: Currently on doxazosin 2 mg daily, nifedipine 90 mg daily, spironolactone 25 mg daily, and labetalol 300 mg twice daily.  BP above goal less than SBP 120.   -Will increase labetalol to 400 mg twice daily as above -Follow-up in pharmacy clinic for further titration of hypertension meds -Given resistant hypertension and also issues with persistent hypokalemia, concern for  hyperaldosteronism.  Will check renin/aldosterone, though may be affected by spironolactone use  Lower extremity edema:  will check CMET, BNP  PAF: Currently in sinus rhythm.  CHADS-VASc score 2 given HTN, vascular disease.  Continue Eliquis 5 mg twice daily  LUE DVT: Occurred following surgery in 08/2018.  Has been on Eliquis 5 mg, advised to take twice daily as above  Dental pain: Planning tooth extraction with his dentist, requesting Eliquis to be held.  Tooth extraction can usually be done on anticoagulation, but can be held if needed given has been 1 year since DVT and low CHADS-VASc score for PAF.  Would hold for 24 hours prior to procedure and resume as soon as OK from bleeding standpoint post procedure.  RTC in 2 months  Medication Adjustments/Labs and Tests Ordered: Current medicines are reviewed at length with the patient today.  Concerns regarding medicines are outlined above.  Orders Placed This Encounter  Procedures  . Comprehensive Metabolic Panel (CMET)  . Pro b natriuretic peptide (BNP)[facility])  . Aldosterone + renin activity w/ ratio   Meds ordered this encounter  Medications  . labetalol (NORMODYNE) 200 MG tablet    Sig: Take 2 tablets (400 mg total) by mouth 2 (two) times daily.    Dispense:  360 tablet    Refill:  3    Patient Instructions  Medication Instructions:  INCREASE- Labetalol 400 mg by mouth twice a day  *If you need a refill on your cardiac medications before your next appointment, please call your pharmacy*  Lab Work: BNP, CMP and Aldosterone- Renin  If you have labs (blood work) drawn today and your tests are completely normal, you will receive your results only by: Marland Kitchen MyChart Message (if you have MyChart) OR . A paper copy in the mail If you have any lab test that is abnormal or we need to change your treatment, we will call you to review the results.  Testing/Procedures: None Ordered  Follow-Up: At [facility], you and your health needs are our priority.  As part of our continuing mission to provide you  with exceptional heart care, we have created designated Provider Care Teams.  These Care Teams include your primary Cardiologist (physician) and Advanced Practice Providers (APPs -  Physician Assistants and Nurse Practitioners) who all work together to provide you with the care you need, when you need it.  Your next appointment:   Next Available in HTN Clinic with Lawanna Kobus, Little Ishikawa, MD  09/09/2019 6:58 AM    [facility]

## 2019-09-07 NOTE — Telephone Encounter (Signed)
Rx has been sent to the pharmacy electronically. ° °

## 2019-09-08 ENCOUNTER — Ambulatory Visit: Payer: Medicaid Other | Admitting: Cardiology

## 2019-09-08 ENCOUNTER — Other Ambulatory Visit: Payer: Self-pay

## 2019-09-08 ENCOUNTER — Encounter: Payer: Self-pay | Admitting: Cardiology

## 2019-09-08 ENCOUNTER — Telehealth: Payer: Self-pay | Admitting: Cardiology

## 2019-09-08 VITALS — BP 145/82 | HR 82 | Ht 71.0 in | Wt 243.2 lb

## 2019-09-08 DIAGNOSIS — Z79899 Other long term (current) drug therapy: Secondary | ICD-10-CM

## 2019-09-08 DIAGNOSIS — R609 Edema, unspecified: Secondary | ICD-10-CM

## 2019-09-08 DIAGNOSIS — I480 Paroxysmal atrial fibrillation: Secondary | ICD-10-CM | POA: Diagnosis not present

## 2019-09-08 DIAGNOSIS — I10 Essential (primary) hypertension: Secondary | ICD-10-CM

## 2019-09-08 DIAGNOSIS — K0889 Other specified disorders of teeth and supporting structures: Secondary | ICD-10-CM

## 2019-09-08 DIAGNOSIS — I7100 Dissection of unspecified site of aorta: Secondary | ICD-10-CM

## 2019-09-08 MED ORDER — LABETALOL HCL 200 MG PO TABS: 400.0000 mg | ORAL_TABLET | Freq: Two times a day (BID) | ORAL | 3 refills | Status: DC

## 2019-09-08 NOTE — Patient Instructions (Signed)
Medication Instructions:  INCREASE- Labetalol 400 mg by mouth twice a day  *If you need a refill on your cardiac medications before your next appointment, please call your pharmacy*  Lab Work: BNP, CMP and Aldosterone- Renin  If you have labs (blood work) drawn today and your tests are completely normal, you will receive your results only by: Marland Kitchen MyChart Message (if you have MyChart) OR . A paper copy in the mail If you have any lab test that is abnormal or we need to change your treatment, we will call you to review the results.  Testing/Procedures: None Ordered  Follow-Up: At [facility], you and your health needs are our priority.  As part of our continuing mission to provide you with exceptional heart care, we have created designated Provider Care Teams.  These Care Teams include your primary Cardiologist (physician) and Advanced Practice Providers (APPs -  Physician Assistants and Nurse Practitioners) who all work together to provide you with the care you need, when you need it.  Your next appointment:   Next Available in [facility] with Belenda Cruise

## 2019-09-08 NOTE — Telephone Encounter (Signed)
New message   Patient needs a new prescription for potassium chloride SA (KLOR-CON) 20 MEQ tablet sent to [id number] - [place], [place] - [address] 90 day supply.

## 2019-09-09 ENCOUNTER — Other Ambulatory Visit: Payer: Self-pay

## 2019-09-09 DIAGNOSIS — Z79899 Other long term (current) drug therapy: Secondary | ICD-10-CM

## 2019-09-09 DIAGNOSIS — R7989 Other specified abnormal findings of blood chemistry: Secondary | ICD-10-CM

## 2019-09-09 MED ORDER — POTASSIUM CHLORIDE CRYS ER 20 MEQ PO TBCR: 40.0000 meq | EXTENDED_RELEASE_TABLET | Freq: Every day | ORAL | 3 refills | Status: DC

## 2019-09-09 NOTE — Telephone Encounter (Signed)
New Rx sent to requested pharmacy. Pt made aware.

## 2019-09-09 NOTE — Telephone Encounter (Signed)
Yes we can refill 

## 2019-09-10 ENCOUNTER — Other Ambulatory Visit: Payer: Self-pay

## 2019-09-10 ENCOUNTER — Encounter: Payer: Medicaid Other | Admitting: Vascular Surgery

## 2019-09-16 LAB — COMPREHENSIVE METABOLIC PANEL
ALT: 8 IU/L (ref 0–44)
AST: 21 IU/L (ref 0–40)
Albumin/Globulin Ratio: 1.3 (ref 1.2–2.2)
Albumin: 3.9 g/dL (ref 3.8–4.8)
Alkaline Phosphatase: 86 IU/L (ref 39–117)
BUN/Creatinine Ratio: 9 — ABNORMAL LOW (ref 10–24)
BUN: 11 mg/dL (ref 8–27)
Bilirubin Total: 0.3 mg/dL (ref 0.0–1.2)
CO2: 24 mmol/L (ref 20–29)
Calcium: 8.8 mg/dL (ref 8.6–10.2)
Chloride: 105 mmol/L (ref 96–106)
Creatinine, Ser: 1.29 mg/dL — ABNORMAL HIGH (ref 0.76–1.27)
GFR calc Af Amer: 68 mL/min/{1.73_m2} (ref >59)
GFR calc non Af Amer: 59 mL/min/{1.73_m2} — ABNORMAL LOW (ref >59)
Globulin, Total: 3.1 g/dL (ref 1.5–4.5)
Glucose: 116 mg/dL — ABNORMAL HIGH (ref 65–99)
Potassium: 3.7 mmol/L (ref 3.5–5.2)
Sodium: 144 mmol/L (ref 134–144)
Total Protein: 7 g/dL (ref 6.0–8.5)

## 2019-09-16 LAB — PRO B NATRIURETIC PEPTIDE: NT-Pro BNP: 719 pg/mL — ABNORMAL HIGH (ref 0–210)

## 2019-09-16 LAB — ALDOSTERONE + RENIN ACTIVITY W/ RATIO
ALDOS/RENIN RATIO: >60.5 — ABNORMAL HIGH (ref 0.0–30.0)
ALDOSTERONE: 10.1 ng/dL (ref 0.0–30.0)
Renin: <0.167 ng/mL/hr — ABNORMAL LOW (ref 0.167–5.380)

## 2019-09-18 ENCOUNTER — Other Ambulatory Visit: Payer: Self-pay

## 2019-09-18 DIAGNOSIS — I10 Essential (primary) hypertension: Secondary | ICD-10-CM

## 2019-09-21 ENCOUNTER — Other Ambulatory Visit (HOSPITAL_COMMUNITY): Payer: Medicaid Other

## 2019-09-22 ENCOUNTER — Ambulatory Visit: Payer: Medicaid Other

## 2019-09-22 NOTE — Progress Notes (Deleted)
     09/22/2019 Connor [name] 1955-09-29 [id number]   HPI:  Connor [name] is a 64 y.o. male patient of Dr Bjorn Pippin, with a PMH below who presents today for hypertension clinic evaluation.  Aldosterone/renin ratio 60.5  Past Medical History:                   Blood Pressure Goal:  130/80  Current Medications: doxazosin 2 mg, labetolol 400 mg bid, nifedipine CC 90 mg qd, spironolactone 25 mg qd  Family Hx:  Social Hx:  Diet:  Exercise:  Home BP readings:  Intolerances:   Labs: 1/21:  Na 144, K 3.7, Glu 116, BUN 11, SCr 1.29   Aldosterone/Renin ratio: 60.5 (normal range 0-30)   NT pro BNP: 719  Wt Readings from Last 3 Encounters:  09/08/19 243 lb 3.2 oz (110.3 kg)  08/07/19 236 lb 3.2 oz (107.1 kg)  06/19/19 233 lb 11 oz (106 kg)   BP Readings from Last 3 Encounters:  09/08/19 (!) 145/82  08/07/19 124/74  06/19/19 (!) 153/92   Pulse Readings from Last 3 Encounters:  09/08/19 82  08/07/19 63  06/19/19 66    Current Outpatient Medications  Medication Sig Dispense Refill  . ASPIRIN LOW DOSE 81 MG EC tablet Take 81 mg by mouth daily.    Marland Kitchen doxazosin (CARDURA) 2 MG tablet Take 1 tablet (2 mg total) by mouth daily. 90 tablet 2  . ELIQUIS 5 MG TABS tablet Take 1 tablet (5 mg total) by mouth 2 (two) times daily. 180 tablet 3  . labetalol (NORMODYNE) 200 MG tablet Take 2 tablets (400 mg total) by mouth 2 (two) times daily. 360 tablet 3  . NIFEdipine (ADALAT CC) 90 MG 24 hr tablet Take 1 tablet (90 mg total) by mouth daily. 90 tablet 2  . potassium chloride SA (KLOR-CON) 20 MEQ tablet Take 2 tablets (40 mEq total) by mouth daily. 180 tablet 3  . spironolactone (ALDACTONE) 25 MG tablet Take 25 mg by mouth daily.     No current facility-administered medications for this visit.    No Known Allergies  Past Medical History:  Diagnosis Date  . History of open heart surgery   . Seroma due to trauma [facility])     There were no vitals taken for this visit.  No  problem-specific Assessment & Plan notes found for this encounter.   Phillips Hay PharmD CPP [facility] [address] [place], [place] [address] [phone]

## 2019-09-28 ENCOUNTER — Encounter: Payer: Medicaid Other | Admitting: Surgery

## 2019-10-02 ENCOUNTER — Other Ambulatory Visit: Payer: Self-pay

## 2019-10-02 ENCOUNTER — Ambulatory Visit (HOSPITAL_COMMUNITY): Payer: Medicaid Other | Attending: Cardiovascular Disease

## 2019-10-02 DIAGNOSIS — R7989 Other specified abnormal findings of blood chemistry: Secondary | ICD-10-CM | POA: Insufficient documentation

## 2019-10-14 ENCOUNTER — Encounter: Payer: Medicaid Other | Admitting: Vascular Surgery

## 2019-10-20 ENCOUNTER — Other Ambulatory Visit: Payer: Self-pay

## 2019-10-20 DIAGNOSIS — Z79899 Other long term (current) drug therapy: Secondary | ICD-10-CM

## 2019-10-21 ENCOUNTER — Other Ambulatory Visit: Payer: Self-pay | Admitting: *Deleted

## 2019-10-21 DIAGNOSIS — I10 Essential (primary) hypertension: Secondary | ICD-10-CM

## 2019-10-21 DIAGNOSIS — R7989 Other specified abnormal findings of blood chemistry: Secondary | ICD-10-CM

## 2019-10-21 DIAGNOSIS — Z79899 Other long term (current) drug therapy: Secondary | ICD-10-CM

## 2019-10-21 LAB — BASIC METABOLIC PANEL
BUN/Creatinine Ratio: 11 (ref 10–24)
BUN: 16 mg/dL (ref 8–27)
CO2: 25 mmol/L (ref 20–29)
Calcium: 9 mg/dL (ref 8.6–10.2)
Chloride: 105 mmol/L (ref 96–106)
Creatinine, Ser: 1.45 mg/dL — ABNORMAL HIGH (ref 0.76–1.27)
GFR calc Af Amer: 59 mL/min/{1.73_m2} — ABNORMAL LOW (ref >59)
GFR calc non Af Amer: 51 mL/min/{1.73_m2} — ABNORMAL LOW (ref >59)
Glucose: 101 mg/dL — ABNORMAL HIGH (ref 65–99)
Potassium: 5.1 mmol/L (ref 3.5–5.2)
Sodium: 143 mmol/L (ref 134–144)

## 2019-10-27 ENCOUNTER — Other Ambulatory Visit: Payer: Self-pay

## 2019-10-27 DIAGNOSIS — Z79899 Other long term (current) drug therapy: Secondary | ICD-10-CM

## 2019-10-27 DIAGNOSIS — R7989 Other specified abnormal findings of blood chemistry: Secondary | ICD-10-CM

## 2019-10-27 DIAGNOSIS — I10 Essential (primary) hypertension: Secondary | ICD-10-CM

## 2019-10-27 LAB — BASIC METABOLIC PANEL
BUN/Creatinine Ratio: 10 (ref 10–24)
BUN: 11 mg/dL (ref 8–27)
CO2: 25 mmol/L (ref 20–29)
Calcium: 8.9 mg/dL (ref 8.6–10.2)
Chloride: 102 mmol/L (ref 96–106)
Creatinine, Ser: 1.14 mg/dL (ref 0.76–1.27)
GFR calc Af Amer: 79 mL/min/{1.73_m2} (ref >59)
GFR calc non Af Amer: 69 mL/min/{1.73_m2} (ref >59)
Glucose: 81 mg/dL (ref 65–99)
Potassium: 3.7 mmol/L (ref 3.5–5.2)
Sodium: 142 mmol/L (ref 134–144)

## 2019-10-28 ENCOUNTER — Other Ambulatory Visit: Payer: Self-pay

## 2019-10-28 MED ORDER — SPIRONOLACTONE 25 MG PO TABS: 12.5000 mg | ORAL_TABLET | Freq: Every day | ORAL | 3 refills | Status: DC

## 2019-11-07 NOTE — Progress Notes (Deleted)
Cardiology Office Note:    Date:  11/07/2019   ID:  Connor [name], DOB July 22, 1956, MRN [id number]  PCP:  Patient, No Pcp Per  Cardiologist:  No primary care provider on file.  Electrophysiologist:  None   Referring MD: No ref. provider found   No chief complaint on file.   History of Present Illness:    Connor [name] is a 64 y.o. male with a hx of aortic dissection who presents for follow-up.  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  He underwent left common and external iliac artery and left renal vein stents on 09/19/2018 in preparation for total aortic arch repair.  Course was complicated by cardiogenic shock (LVEF down to 15%, which has since recovered), AKI requiring CVVH (with subsequent recovery of renal function), extensive left upper extremity DVT.  On 10/03/2018, he underwent total arch replacement, frozen elephant trunk, and head vessel reconstruction with Dr.Takayama and Dr. Allena Katz.  Course was complicated by sternal wound infection, with cultures growing Klebsiella.  He completed a 14-day course of cefadroxil.  He was following closely with cardiology and nephrology in [place] for blood pressure control.  Moved to [place] in July to help with a family issue.   He was in the ED on 06/19/2019 with chest tightness and shortness of breath.  Troponins negative x2.  CTA chest showed patent graft of the ascending aortic component, patent graft of the proximal descending aorta with, thrombosis of the proximal descending thoracic aortic dissection false lumen along the stent graft, patent type B aortic dissection of the mid descending thoracic aorta extending to the abdominal aortic bifurcation.  Reports that he feels chest pain has been related to muscle cramps from low potassium.  Now taking potassium supplements.  States that he also has chest pain across the incision in the right upper chest  when he does stretches.  He reports that he saw his dentist about having a tooth extraction done, but was canceled because he was on Eliquis.  TTE on 10/02/2019 showed EF 6065%, severe LVH, normal RV function, no significant valvular disease.  Since his last clinic visit, he has not had his tooth extraction done yet.  Reports he intermittently checks blood pressure at home but has not brought his log with him.  Reports BP has been 130s to 140s at home.  Reports he has been off his potassium for 2 days.   Past Medical History:  Diagnosis Date  . History of open heart surgery   . Seroma due to trauma [facility])     No past surgical history on file.  Current Medications: No outpatient medications have been marked as taking for the 11/09/19 encounter (Appointment) with Little Ishikawa, MD.     Allergies:   Patient has no known allergies.   Social History   Socioeconomic History  . Marital status: Single    Spouse name: Not on file  . Number of children: Not on file  . Years of education: Not on file  . Highest education level: Not on file  Occupational History  . Not on file  Tobacco Use  . Smoking status: Current Some Day Smoker  . Smokeless tobacco: Never Used  Substance and Sexual Activity  . Alcohol use: Never  . Drug use: Never  . Sexual activity: Not on file  Other Topics Concern  . Not on file  Social History Narrative  .  Not on file   Social Determinants of Health   Financial Resource Strain:   . Difficulty of Paying Living Expenses:   Food Insecurity:   . Worried About Charity fundraiser in the Last Year:   . Arboriculturist in the Last Year:   Transportation Needs:   . Film/video editor (Medical):   Marland Kitchen Lack of Transportation (Non-Medical):   Physical Activity:   . Days of Exercise per Week:   . Minutes of Exercise per Session:   Stress:   . Feeling of Stress :   Social Connections:   . Frequency of Communication with Friends and Family:   .  Frequency of Social Gatherings with Friends and Family:   . Attends Religious Services:   . Active Member of Clubs or Organizations:   . Attends Archivist Meetings:   Marland Kitchen Marital Status:      Family History: Mother died of MI at age 32.  Father had stroke at age 8  ROS:   Please see the history of present illness.     All other systems reviewed and are negative.  EKGs/Labs/Other Studies Reviewed:    The following studies were reviewed today:   EKG:  EKG is  ordered today.  The ekg ordered today demonstrates normal sinus rhythm, first-degree AV block, rate 73, left axis deviation, no ST/T abnormalities  TTE 10/02/19: 1. Left ventricular ejection fraction, by visual estimation, is 60 to  65%. The left ventricle has normal function. Left ventricular septal wall  thickness was severely increased. Severely increased left ventricular  posterior wall thickness. There is  severely increased left ventricular hypertrophy.  2. Left ventricular diastolic function could not be evaluated.  3. The left ventricle has no regional wall motion abnormalities.  4. Global right ventricle has normal systolic function.The right  ventricular size is normal. No increase in right ventricular wall  thickness.  5. Left atrial size was moderately dilated.  6. Right atrial size was normal.  7. The mitral valve is normal in structure. Trivial mitral valve  regurgitation. No evidence of mitral stenosis.  8. The tricuspid valve is normal in structure.  9. The tricuspid valve is normal in structure. Tricuspid valve  regurgitation is not demonstrated.  10. The aortic valve is tricuspid. Aortic valve regurgitation is not  visualized. No evidence of aortic valve sclerosis or stenosis.  11. The pulmonic valve was normal in structure. Pulmonic valve  regurgitation is not visualized.  12. The inferior vena cava is normal in size with greater than 50%  respiratory variability, suggesting right  atrial pressure of 3 mmHg.   Recent Labs: 06/19/2019: B Natriuretic Peptide 93.0; Hemoglobin 10.8; Magnesium 2.0; Platelets 202 09/08/2019: ALT 8; NT-Pro BNP 719 10/27/2019: BUN 11; Creatinine, Ser 1.14; Potassium 3.7; Sodium 142  Recent Lipid Panel No results found for: CHOL, TRIG, HDL, CHOLHDL, VLDL, LDLCALC, LDLDIRECT  Physical Exam:    VS:  There were no vitals taken for this visit.    Wt Readings from Last 3 Encounters:  09/08/19 243 lb 3.2 oz (110.3 kg)  08/07/19 236 lb 3.2 oz (107.1 kg)  06/19/19 233 lb 11 oz (106 kg)     GEN: Well nourished, well developed in no acute distress HEENT: Normal NECK: No JVD; No carotid bruits LYMPHATICS: No lymphadenopathy CARDIAC: RRR, 3/6 systolic murmur RESPIRATORY:  Clear to auscultation without rales, wheezing or rhonchi  ABDOMEN: Soft, non-tender, non-distended MUSCULOSKELETAL:  No edema; No deformity  SKIN: Warm and dry  NEUROLOGIC:  Alert and oriented x 3 PSYCHIATRIC:  Normal affect   ASSESSMENT:    No diagnosis found. 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  Patient requesting a second opinion on surgery, have referred to surgery here -Increase labetalol to 400 mg twice daily.  Goal SBP less than 120, heart rate less than 60.  Asked patient to monitor his blood pressure and heart rate daily for next 2 weeks and call with results  Hypertension: Currently on doxazosin 2 mg daily, nifedipine 90 mg daily, spironolactone 12.5 mg daily, and labetalol 400 mg twice daily.  BP above goal less than SBP 120.   -Recently decreased Aldactone to 12.5 mg twice daily due to AKI -Follow-up in  pharmacy clinic for further titration of hypertension meds -Given resistant hypertension and also issues with persistent hypokalemia, concern for hyperaldosteronism.  Renin/aldosterone ratio >60, have referred to endocrinology for evaluation of hyperaldosteronism  Lower extremity edema: TTE shows normal LVEF, normal RV function, no significant valvular disease  PAF: Currently in sinus rhythm.  CHADS-VASc score 2 given HTN, vascular disease.  Continue Eliquis 5 mg twice daily  LUE DVT: Occurred following surgery in 08/2018.  Has been on Eliquis 5 mg, advised to take twice daily as above  Dental pain: Planning tooth extraction with his dentist, requesting Eliquis to be held.  Tooth extraction can usually be done on anticoagulation, but can be held if needed given has been 1 year since DVT and low CHADS-VASc score for PAF.  Would hold for 24 hours prior to procedure and resume as soon as OK from bleeding standpoint post procedure.  RTC in ***  Medication Adjustments/Labs and Tests Ordered: Current medicines are reviewed at length with the patient today.  Concerns regarding medicines are outlined above.  No orders of the defined types were placed in this encounter.  No orders of the defined types were placed in this encounter.   There are no Patient Instructions on file for this visit.   Signed, Little Ishikawa, MD  11/07/2019 2:13 PM    [facility]

## 2019-11-09 ENCOUNTER — Ambulatory Visit: Payer: Medicaid Other | Admitting: Cardiology

## 2019-11-16 ENCOUNTER — Encounter: Payer: Medicaid Other | Admitting: Surgery

## 2019-11-16 NOTE — Progress Notes (Signed)
Cardiology Office Note:    Date:  11/26/2019   ID:  Connor [name], DOB [birth year], MRN [id number]  PCP:  Patient, No Pcp Per  Cardiologist:  No primary care provider on file.  Electrophysiologist:  None   Referring MD: No ref. provider found   Chief Complaint  Patient presents with  . Hypertension    History of Present Illness:    Connor [name] is a 64 y.o. male with a hx of aortic dissection who presents for follow-up.  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  He underwent left common and external iliac artery and left renal vein stents on 09/19/2018 in preparation for total aortic arch repair.  Course was complicated by cardiogenic shock (LVEF down to 15%, which has since recovered), AKI requiring CVVH (with subsequent recovery of renal function), extensive left upper extremity DVT.  On 10/03/2018, he underwent total arch replacement, frozen elephant trunk, and head vessel reconstruction with Dr.Takayama and Dr. Allena Katz.  Course was complicated by sternal wound infection, with cultures growing Klebsiella.  He completed a 14-day course of cefadroxil.  He was following closely with cardiology and nephrology in [place] for blood pressure control.  Moved to [place] in July to help with a family issue.   He was in the ED on 06/19/2019 with chest tightness and shortness of breath.  Troponins negative x2.  CTA chest showed patent graft of the ascending aortic component, patent graft of the proximal descending aorta with, thrombosis of the proximal descending thoracic aortic dissection false lumen along the stent graft, patent type B aortic dissection of the mid descending thoracic aorta extending to the abdominal aortic bifurcation.  Reports that he feels chest pain has been related to muscle cramps from low potassium.  Now taking potassium supplements.  States that he also has chest pain across the  incision in the right upper chest when he does stretches.  He reports that he saw his dentist about having a tooth extraction done, but was canceled because he was on Eliquis.  TTE on 10/02/2019 showed EF 60-65%, severe LVH, normal RV function, no significant valvular disease.  Since his last clinic visit, he has not had his tooth extraction done yet.  Reports he intermittently checks blood pressure at home but has not brought his log with him.  Reports that he has been having dizzy spells.  States that it is after takes BP meds, has not checked BP during episodes.  Denies any chest pain or dyspnea.   Past Medical History:  Diagnosis Date  . History of open heart surgery   . Seroma due to trauma [facility])     No past surgical history on file.  Current Medications: Current Meds  Medication Sig  . ASPIRIN LOW DOSE 81 MG EC tablet Take 81 mg by mouth daily.  Marland Kitchen ELIQUIS 5 MG TABS tablet Take 1 tablet (5 mg total) by mouth 2 (two) times daily.  Marland Kitchen labetalol (NORMODYNE) 200 MG tablet Take 2 tablets (400 mg total) by mouth 2 (two) times daily.  Marland Kitchen NIFEdipine (ADALAT CC) 90 MG 24 hr tablet Take 1 tablet (90 mg total) by mouth daily.  Marland Kitchen spironolactone (ALDACTONE) 25 MG tablet Take 0.5 tablets (12.5 mg total) by mouth daily.  . [DISCONTINUED] doxazosin (CARDURA) 2 MG tablet Take 1 tablet (2 mg total) by mouth daily.     Allergies:   Patient has no known  allergies.   Social History   Socioeconomic History  . Marital status: Single    Spouse name: Not on file  . Number of children: Not on file  . Years of education: Not on file  . Highest education level: Not on file  Occupational History  . Not on file  Tobacco Use  . Smoking status: Current Some Day Smoker  . Smokeless tobacco: Never Used  Substance and Sexual Activity  . Alcohol use: Never  . Drug use: Never  . Sexual activity: Not on file  Other Topics Concern  . Not on file  Social History Narrative  . Not on file   Social  Determinants of Health   Financial Resource Strain:   . Difficulty of Paying Living Expenses:   Food Insecurity:   . Worried About Charity fundraiser in the Last Year:   . Arboriculturist in the Last Year:   Transportation Needs:   . Film/video editor (Medical):   Marland Kitchen Lack of Transportation (Non-Medical):   Physical Activity:   . Days of Exercise per Week:   . Minutes of Exercise per Session:   Stress:   . Feeling of Stress :   Social Connections:   . Frequency of Communication with Friends and Family:   . Frequency of Social Gatherings with Friends and Family:   . Attends Religious Services:   . Active Member of Clubs or Organizations:   . Attends Archivist Meetings:   Marland Kitchen Marital Status:      Family History: Mother died of MI at age 5.  Father had stroke at age 38  ROS:   Please see the history of present illness.     All other systems reviewed and are negative.  EKGs/Labs/Other Studies Reviewed:    The following studies were reviewed today:   EKG:  EKG is  ordered today.  The ekg ordered today demonstrates normal sinus rhythm, first-degree AV block, rate 73, left axis deviation, no ST/T abnormalities  TTE 10/02/19: 1. Left ventricular ejection fraction, by visual estimation, is 60 to  65%. The left ventricle has normal function. Left ventricular septal wall  thickness was severely increased. Severely increased left ventricular  posterior wall thickness. There is  severely increased left ventricular hypertrophy.  2. Left ventricular diastolic function could not be evaluated.  3. The left ventricle has no regional wall motion abnormalities.  4. Global right ventricle has normal systolic function.The right  ventricular size is normal. No increase in right ventricular wall  thickness.  5. Left atrial size was moderately dilated.  6. Right atrial size was normal.  7. The mitral valve is normal in structure. Trivial mitral valve  regurgitation. No  evidence of mitral stenosis.  8. The tricuspid valve is normal in structure.  9. The tricuspid valve is normal in structure. Tricuspid valve  regurgitation is not demonstrated.  10. The aortic valve is tricuspid. Aortic valve regurgitation is not  visualized. No evidence of aortic valve sclerosis or stenosis.  11. The pulmonic valve was normal in structure. Pulmonic valve  regurgitation is not visualized.  12. The inferior vena cava is normal in size with greater than 50%  respiratory variability, suggesting right atrial pressure of 3 mmHg.   Recent Labs: 06/19/2019: B Natriuretic Peptide 93.0; Hemoglobin 10.8; Magnesium 2.0; Platelets 202 09/08/2019: ALT 8; NT-Pro BNP 719 10/27/2019: BUN 11; Creatinine, Ser 1.14; Potassium 3.7; Sodium 142  Recent Lipid Panel No results found for: CHOL, TRIG, HDL,  CHOLHDL, VLDL, LDLCALC, LDLDIRECT  Physical Exam:    VS:  BP (!) 150/80   Pulse 88   Temp (!) 97.3 F (36.3 C)   Ht 5\' 11"  (1.803 m)   Wt 246 lb (111.6 kg)   SpO2 97%   BMI 34.31 kg/m     Wt Readings from Last 3 Encounters:  11/20/19 246 lb (111.6 kg)  09/08/19 243 lb 3.2 oz (110.3 kg)  08/07/19 236 lb 3.2 oz (107.1 kg)     GEN: Well nourished, well developed in no acute distress HEENT: Normal NECK: No JVD; No carotid bruits LYMPHATICS: No lymphadenopathy CARDIAC: RRR, 3/6 systolic murmur RESPIRATORY:  Clear to auscultation without rales, wheezing or rhonchi  ABDOMEN: Soft, non-tender, non-distended MUSCULOSKELETAL:  No edema; No deformity  SKIN: Warm and dry NEUROLOGIC:  Alert and oriented x 3 PSYCHIATRIC:  Normal affect   ASSESSMENT:    1. Essential hypertension   2. Dissection of aorta, unspecified portion of aorta (HCC)   3. Deep vein thrombosis (DVT) of left upper extremity, unspecified chronicity, unspecified vein (HCC)   4. PAF (paroxysmal atrial fibrillation) (HCC)   5. Edema, unspecified type    PLAN:    Aortic dissection: Type A aortic arch dissection  status post repair in 2016 with dissection at anastomosis suture lines in 07/2018 status post left common and external iliac artery and left renal vein stents on 09/19/2018 and then total arch replacement, frozen elephant trunk, head vessel reconstruction on 10/03/2018 in 12/02/2018 -Planning multi-staged reconstruction of entire aorta in [place].  Patient requesting a second opinion on surgery, have referred to surgery here -Goal SBP less than 120, heart rate less than 60.  Remains above goal, will add valsartan 80 mg daily and check BMP in 1 week.  Will schedule follow-up in pharmacy clinic for further titration of antihypertensives  Hypertension: Currently on doxazosin 2 mg daily, nifedipine 90 mg daily, spironolactone 12.5 mg daily, and labetalol 400 mg twice daily.  BP above goal less than SBP 120.   -Recently decreased Aldactone to 12.5 mg daily due to AKI, which resolved -Add valsartan 80 mg daily.  Check BMP in 1 week -Follow-up in pharmacy clinic for further titration of hypertension meds -Given resistant hypertension and also issues with persistent hypokalemia, concern for hyperaldosteronism.  Renin/aldosterone ratio >60, have referred to endocrinology for evaluation of hyperaldosteronism  Lower extremity edema: TTE shows normal LVEF, normal RV function, no significant valvular disease  PAF: Currently in sinus rhythm.  CHADS-VASc score 2 given HTN, vascular disease.  Continue Eliquis 5 mg twice daily  LUE DVT: Occurred following surgery in 08/2018.  Has been on Eliquis 5 mg, advised to take twice daily as above  Dental pain: Planning tooth extraction with his dentist, requesting Eliquis to be held.  Tooth extraction can usually be done on anticoagulation, but can be held if needed given has been 1 year since DVT and low CHADS-VASc score for PAF.  Would hold for 24 hours prior to procedure and resume as soon as OK from bleeding standpoint post procedure.  RTC in 2 months  Medication  Adjustments/Labs and Tests Ordered: Current medicines are reviewed at length with the patient today.  Concerns regarding medicines are outlined above.  Orders Placed This Encounter  Procedures  . Basic metabolic panel   Meds ordered this encounter  Medications  . valsartan (DIOVAN) 80 MG tablet    Sig: Take 1 tablet (80 mg total) by mouth daily.    Dispense:  90 tablet    Refill:  3    Patient Instructions  Medication Instructions:  Stop Cardura  Start Valsartan 80 mg daily (check BP and call in 1 week!)  *If you need a refill on your cardiac medications before your next appointment, please call your pharmacy*  Labs: BMET in one week.   Follow-Up: At [facility], you and your health needs are our priority.  As part of our continuing mission to provide you with exceptional heart care, we have created designated Provider Care Teams.  These Care Teams include your primary Cardiologist (physician) and Advanced Practice Providers (APPs -  Physician Assistants and Nurse Practitioners) who all work together to provide you with the care you need, when you need it.  We recommend signing up for the patient portal called "MyChart".  Sign up information is provided on this After Visit Summary.  MyChart is used to connect with patients for Virtual Visits (Telemedicine).  Patients are able to view lab/test results, encounter notes, upcoming appointments, etc.  Non-urgent messages can be sent to your provider as well.   To learn more about what you can do with MyChart, go to [email or web address].    Your next appointment:   6 week(s)  The format for your next appointment:   In Person  Provider:   Epifanio Lesches, MD  Needs appointment to see HTN clinic Pharm in 2 weeks.   Other Instructions Number to vascular Dr.Brabham [phone] Number to Endocrinology Dr Everardo All [phone] Number to receive Primary Care [phone]       Signed, Little Ishikawa, MD   11/26/2019 8:25 PM    [facility]

## 2019-11-20 ENCOUNTER — Other Ambulatory Visit: Payer: Self-pay

## 2019-11-20 ENCOUNTER — Ambulatory Visit (INDEPENDENT_AMBULATORY_CARE_PROVIDER_SITE_OTHER): Payer: Medicaid Other | Admitting: Cardiology

## 2019-11-20 ENCOUNTER — Encounter: Payer: Self-pay | Admitting: Cardiology

## 2019-11-20 VITALS — BP 150/80 | HR 88 | Temp 97.3°F | Ht 71.0 in | Wt 246.0 lb

## 2019-11-20 DIAGNOSIS — I480 Paroxysmal atrial fibrillation: Secondary | ICD-10-CM | POA: Diagnosis not present

## 2019-11-20 DIAGNOSIS — R609 Edema, unspecified: Secondary | ICD-10-CM

## 2019-11-20 DIAGNOSIS — I10 Essential (primary) hypertension: Secondary | ICD-10-CM | POA: Diagnosis not present

## 2019-11-20 DIAGNOSIS — I82622 Acute embolism and thrombosis of deep veins of left upper extremity: Secondary | ICD-10-CM

## 2019-11-20 DIAGNOSIS — I7100 Dissection of unspecified site of aorta: Secondary | ICD-10-CM | POA: Diagnosis not present

## 2019-11-20 MED ORDER — VALSARTAN 80 MG PO TABS: 80.0000 mg | ORAL_TABLET | Freq: Every day | ORAL | 3 refills | Status: DC

## 2019-11-20 NOTE — Patient Instructions (Addendum)
Medication Instructions:  Stop Cardura  Start Valsartan 80 mg daily (check BP and call in 1 week!)  *If you need a refill on your cardiac medications before your next appointment, please call your pharmacy*  Labs: BMET in one week.   Follow-Up: At [facility], you and your health needs are our priority.  As part of our continuing mission to provide you with exceptional heart care, we have created designated Provider Care Teams.  These Care Teams include your primary Cardiologist (physician) and Advanced Practice Providers (APPs -  Physician Assistants and Nurse Practitioners) who all work together to provide you with the care you need, when you need it.  We recommend signing up for the patient portal called "MyChart".  Sign up information is provided on this After Visit Summary.  MyChart is used to connect with patients for Virtual Visits (Telemedicine).  Patients are able to view lab/test results, encounter notes, upcoming appointments, etc.  Non-urgent messages can be sent to your provider as well.   To learn more about what you can do with MyChart, go to [email or web address].    Your next appointment:   6 week(s)  The format for your next appointment:   In Person  Provider:   Epifanio Lesches, MD  Needs appointment to see HTN clinic Pharm in 2 weeks.   Other Instructions Number to vascular Dr.Brabham [phone] Number to Endocrinology Dr Everardo All [phone] Number to receive Primary Care [phone]

## 2019-11-23 ENCOUNTER — Encounter: Payer: Self-pay | Admitting: Surgery

## 2019-12-03 ENCOUNTER — Ambulatory Visit: Payer: Medicaid Other

## 2019-12-03 NOTE — Progress Notes (Deleted)
     12/03/2019 Connor [name] 01-20-1956 [id number]   HPI:  Connor [name] is a 64 y.o. male patient of Dr Jerene Pitch, with a PMH below who presents today for hypertension clinic evaluation.  He was first seen by Dr. Bjorn Pippin  Past Medical History: Aortic dissection Total arch replacement, frozen elephant trunk, Feb 2020 in [place]; planning multi-staged reconstruction of entire aorta  Hyperaldosteronism ARR > 60  Atrial fibrillation Paroxysmal - CHADS2-VASc = 2 (htn, vascular disease)  DVT Post surgery in January 2020, on Eliquis         Blood Pressure Goal:  130/80  Current Medications: doxazosin 2 mg qd, nifedipine xl 90 mg qd, labetalol 400 mg bid, spironolactone 12.5 mg qd (recently cut back from 25 mg d/t AKI).  On 3/26 added valsartan 80 mg qd  Family Hx:  Social Hx:  Diet:  Exercise:  Home BP readings:  Intolerances:   Labs: 10/2019:  Na 142, K 3.7, Glu 81, BUN 11, SCr 1.14 (GFR 79)  08/2019:  Aldosterone 10.1, Renin < 0.167, Aldos/Renin ratio >60.5   Wt Readings from Last 3 Encounters:  11/20/19 246 lb (111.6 kg)  09/08/19 243 lb 3.2 oz (110.3 kg)  08/07/19 236 lb 3.2 oz (107.1 kg)   BP Readings from Last 3 Encounters:  11/20/19 (!) 150/80  09/08/19 (!) 145/82  08/07/19 124/74   Pulse Readings from Last 3 Encounters:  11/20/19 88  09/08/19 82  08/07/19 63    Current Outpatient Medications  Medication Sig Dispense Refill  . ASPIRIN LOW DOSE 81 MG EC tablet Take 81 mg by mouth daily.    Marland Kitchen ELIQUIS 5 MG TABS tablet Take 1 tablet (5 mg total) by mouth 2 (two) times daily. 180 tablet 3  . labetalol (NORMODYNE) 200 MG tablet Take 2 tablets (400 mg total) by mouth 2 (two) times daily. 360 tablet 3  . NIFEdipine (ADALAT CC) 90 MG 24 hr tablet Take 1 tablet (90 mg total) by mouth daily. 90 tablet 2  . spironolactone (ALDACTONE) 25 MG tablet Take 0.5 tablets (12.5 mg total) by mouth daily. 30 tablet 3  . valsartan (DIOVAN) 80 MG tablet Take 1 tablet (80 mg total) by mouth  daily. 90 tablet 3   No current facility-administered medications for this visit.    No Known Allergies  Past Medical History:  Diagnosis Date  . History of open heart surgery   . Seroma due to trauma [facility])     There were no vitals taken for this visit.  No problem-specific Assessment & Plan notes found for this encounter.   Phillips Hay PharmD CPP [facility] [address] [place], [place] [address] [phone]

## 2019-12-04 LAB — BASIC METABOLIC PANEL
BUN/Creatinine Ratio: 15 (ref 10–24)
BUN: 21 mg/dL (ref 8–27)
CO2: 23 mmol/L (ref 20–29)
Calcium: 9.3 mg/dL (ref 8.6–10.2)
Chloride: 105 mmol/L (ref 96–106)
Creatinine, Ser: 1.43 mg/dL — ABNORMAL HIGH (ref 0.76–1.27)
GFR calc Af Amer: 60 mL/min/{1.73_m2} (ref >59)
GFR calc non Af Amer: 52 mL/min/{1.73_m2} — ABNORMAL LOW (ref >59)
Glucose: 82 mg/dL (ref 65–99)
Potassium: 4.1 mmol/L (ref 3.5–5.2)
Sodium: 144 mmol/L (ref 134–144)

## 2019-12-08 ENCOUNTER — Other Ambulatory Visit: Payer: Self-pay | Admitting: *Deleted

## 2019-12-08 DIAGNOSIS — I10 Essential (primary) hypertension: Secondary | ICD-10-CM

## 2019-12-08 DIAGNOSIS — Z79899 Other long term (current) drug therapy: Secondary | ICD-10-CM

## 2019-12-08 DIAGNOSIS — R7989 Other specified abnormal findings of blood chemistry: Secondary | ICD-10-CM

## 2019-12-11 ENCOUNTER — Telehealth: Payer: Self-pay | Admitting: Cardiology

## 2019-12-11 DIAGNOSIS — I7100 Dissection of unspecified site of aorta: Secondary | ICD-10-CM

## 2019-12-11 NOTE — Telephone Encounter (Signed)
Patient needs a new referral sent to Vascular Surgery. He states the referral is expired.

## 2019-12-11 NOTE — Telephone Encounter (Signed)
Returned call to patient-patient aware vascular surgery referral placed and their office will call to schedule appointment.     Also made aware of lab results and recommendations to repeat lab work (lmtcb x 2).   States he will be in Tuesday to get this done.

## 2019-12-11 NOTE — Telephone Encounter (Signed)
Please advise if okay to send in for vascular surgery- looks like the last referral was to cardiothoracic, and this was the note attached to that referral?  Dr Laneta Simmers looked at everything and this needs to go to [facility] for Stent  Graph  Will make sure before resending the referral over to [facility].

## 2019-12-25 ENCOUNTER — Other Ambulatory Visit: Payer: Self-pay

## 2019-12-25 DIAGNOSIS — Z79899 Other long term (current) drug therapy: Secondary | ICD-10-CM

## 2019-12-25 DIAGNOSIS — I10 Essential (primary) hypertension: Secondary | ICD-10-CM

## 2019-12-25 DIAGNOSIS — R7989 Other specified abnormal findings of blood chemistry: Secondary | ICD-10-CM

## 2019-12-26 LAB — BASIC METABOLIC PANEL
BUN/Creatinine Ratio: 13 (ref 10–24)
BUN: 13 mg/dL (ref 8–27)
CO2: 23 mmol/L (ref 20–29)
Calcium: 8.7 mg/dL (ref 8.6–10.2)
Chloride: 104 mmol/L (ref 96–106)
Creatinine, Ser: 1.03 mg/dL (ref 0.76–1.27)
GFR calc Af Amer: 90 mL/min/{1.73_m2} (ref >59)
GFR calc non Af Amer: 77 mL/min/{1.73_m2} (ref >59)
Glucose: 78 mg/dL (ref 65–99)
Potassium: 3.6 mmol/L (ref 3.5–5.2)
Sodium: 142 mmol/L (ref 134–144)

## 2020-01-03 NOTE — Progress Notes (Signed)
Cardiology Office Note:    Date:  01/05/2020   ID:  Connor [name], DOB [birth year], MRN [id number]  PCP:  Patient, No Pcp Per  Cardiologist:  No primary care provider on file.  Electrophysiologist:  None   Referring MD: No ref. provider found   Chief Complaint  Patient presents with   Hypertension    History of Present Illness:    Connor [name] is a 64 y.o. male with a hx of aortic dissection who presents for follow-up.  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  He underwent left common and external iliac artery and left renal vein stents on 09/19/2018 in preparation for total aortic arch repair.  Course was complicated by cardiogenic shock (LVEF down to 15%, which has since recovered), AKI requiring CVVH (with subsequent recovery of renal function), extensive left upper extremity DVT.  On 10/03/2018, he underwent total arch replacement, frozen elephant trunk, and head vessel reconstruction with Dr.Takayama and Dr. Posey Pronto.  Course was complicated by sternal wound infection, with cultures growing Klebsiella.  He completed a 14-day course of cefadroxil.  He was following closely with cardiology and nephrology in [place] for blood pressure control.  Moved to [place] in July to help with a family issue.   He was in the ED on 06/19/2019 with chest tightness and shortness of breath.  Troponins negative x2.  CTA chest showed patent graft of the ascending aortic component, patent graft of the proximal descending aorta with, thrombosis of the proximal descending thoracic aortic dissection false lumen along the stent graft, patent type B aortic dissection of the mid descending thoracic aorta extending to the abdominal aortic bifurcation.  Reports that he feels chest pain has been related to muscle cramps from low potassium.  Now taking potassium supplements.  States that he also has chest pain across the  incision in the right upper chest when he does stretches.  He reports that he saw his dentist about having a tooth extraction done, but was canceled because he was on Eliquis.  TTE on 10/02/2019 showed EF 60-65%, severe LVH, normal RV function, no significant valvular disease.  Since his last clinic visit, he reports that he stopped taking labetalol.  States that he misunderstood instructions and stopped his labetalol when he started valsartan at last clinic visit.  He reports that he continues to have some chest pain around his incision site in right side of chest when he moves his right arm in certain ways.  Otherwise denies any chest pain or dyspnea.  He denies any palpitations, lightheadedness, or syncope.  He is taking Eliquis, denies any bleeding issues.   Past Medical History:  Diagnosis Date   History of open heart surgery    Seroma due to trauma [facility])     No past surgical history on file.  Current Medications: Current Meds  Medication Sig   ASPIRIN LOW DOSE 81 MG EC tablet Take 81 mg by mouth daily.   ELIQUIS 5 MG TABS tablet Take 1 tablet (5 mg total) by mouth 2 (two) times daily.   labetalol (NORMODYNE) 200 MG tablet Take 2 tablets (400 mg total) by mouth 2 (two) times daily.   NIFEdipine (ADALAT CC) 90 MG 24 hr tablet Take 1 tablet (90 mg total) by mouth daily.   spironolactone (ALDACTONE) 25 MG tablet Take 0.5 tablets (12.5 mg total) by mouth daily.   valsartan (DIOVAN) 80 MG  tablet Take 1 tablet (80 mg total) by mouth daily.     Allergies:   Patient has no known allergies.   Social History   Socioeconomic History   Marital status: Single    Spouse name: Not on file   Number of children: Not on file   Years of education: Not on file   Highest education level: Not on file  Occupational History   Not on file  Tobacco Use   Smoking status: Current Some Day Smoker   Smokeless tobacco: Never Used  Substance and Sexual Activity   Alcohol use: Never    Drug use: Never   Sexual activity: Not on file  Other Topics Concern   Not on file  Social History Narrative   Not on file   Social Determinants of Health   Financial Resource Strain:    Difficulty of Paying Living Expenses:   Food Insecurity:    Worried About Running Out of Food in the Last Year:    Barista in the Last Year:   Transportation Needs:    Freight forwarder (Medical):    Lack of Transportation (Non-Medical):   Physical Activity:    Days of Exercise per Week:    Minutes of Exercise per Session:   Stress:    Feeling of Stress :   Social Connections:    Frequency of Communication with Friends and Family:    Frequency of Social Gatherings with Friends and Family:    Attends Religious Services:    Active Member of Clubs or Organizations:    Attends Banker Meetings:    Marital Status:      Family History: Mother died of MI at age 58.  Father had stroke at age 55  ROS:   Please see the history of present illness.     All other systems reviewed and are negative.  EKGs/Labs/Other Studies Reviewed:    The following studies were reviewed today:   EKG:  EKG is  ordered today.  The ekg ordered today demonstrates atrial fibrillation, rate 100, incomplete right bundle branch block, Q waves in V1/2  TTE 10/02/19: 1. Left ventricular ejection fraction, by visual estimation, is 60 to  65%. The left ventricle has normal function. Left ventricular septal wall  thickness was severely increased. Severely increased left ventricular  posterior wall thickness. There is  severely increased left ventricular hypertrophy.  2. Left ventricular diastolic function could not be evaluated.  3. The left ventricle has no regional wall motion abnormalities.  4. Global right ventricle has normal systolic function.The right  ventricular size is normal. No increase in right ventricular wall  thickness.  5. Left atrial size was moderately  dilated.  6. Right atrial size was normal.  7. The mitral valve is normal in structure. Trivial mitral valve  regurgitation. No evidence of mitral stenosis.  8. The tricuspid valve is normal in structure.  9. The tricuspid valve is normal in structure. Tricuspid valve  regurgitation is not demonstrated.  10. The aortic valve is tricuspid. Aortic valve regurgitation is not  visualized. No evidence of aortic valve sclerosis or stenosis.  11. The pulmonic valve was normal in structure. Pulmonic valve  regurgitation is not visualized.  12. The inferior vena cava is normal in size with greater than 50%  respiratory variability, suggesting right atrial pressure of 3 mmHg.   Recent Labs: 06/19/2019: B Natriuretic Peptide 93.0; Hemoglobin 10.8; Magnesium 2.0; Platelets 202 09/08/2019: ALT 8; NT-Pro BNP 719 12/25/2019:  BUN 13; Creatinine, Ser 1.03; Potassium 3.6; Sodium 142  Recent Lipid Panel No results found for: CHOL, TRIG, HDL, CHOLHDL, VLDL, LDLCALC, LDLDIRECT  Physical Exam:    VS:  BP (!) 162/94    Pulse (!) 124    Ht 5\' 11"  (1.803 m)    Wt 244 lb 9.6 oz (110.9 kg)    SpO2 97%    BMI 34.11 kg/m     Wt Readings from Last 3 Encounters:  01/05/20 244 lb 9.6 oz (110.9 kg)  11/20/19 246 lb (111.6 kg)  09/08/19 243 lb 3.2 oz (110.3 kg)     GEN: Well nourished, well developed in no acute distress HEENT: Normal NECK: No JVD CARDIAC: irregular, 2/6 systolic murmur RESPIRATORY:  Clear to auscultation without rales, wheezing or rhonchi  ABDOMEN: Soft, non-tender, non-distended MUSCULOSKELETAL:  No edema; No deformity  SKIN: Warm and dry NEUROLOGIC:  Alert and oriented x 3 PSYCHIATRIC:  Normal affect   ASSESSMENT:    1. PAF (paroxysmal atrial fibrillation) (HCC)   2. Dissection of aorta, unspecified portion of aorta (HCC)   3. Essential hypertension   4. Hyperaldosteronism (HCC)   5. 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  Patient requesting a second opinion on surgery, have referred to surgery here, has appointment later this month -Goal SBP less than 120, heart rate less than 60.    PAF: In AF with RVR today, rates 100-120s.  CHADS-VASc score 2 given HTN, vascular disease.  He does not appear symptomatic. -Continue Eliquis 5 mg twice daily -He had stopped taking labetalol, will restart labetalol 400 mg twice daily -Follow-up in 3 weeks.  If remains in atrial fibrillation, will schedule cardioversion  Hypertension: Currently on nifedipine 90 mg daily, spironolactone 12.5 mg daily, valsartan 80 mg daily, and labetalol 400 mg twice daily.  However, he stopped taking his labetalol.  BP above goal less than SBP 120.   -Restart labetalol 400 mg twice daily -Given resistant hypertension and also issues with persistent hypokalemia, concern for hyperaldosteronism.  Renin/aldosterone ratio >60, have referred to endocrinology for evaluation of hyperaldosteronism -Suspect untreated OSA also contributing to resistant hypertension, will order sleep study  Lower extremity edema: TTE shows normal LVEF, normal RV function, no significant valvular disease  Snoring: Evaluate for OSA with sleep study as above  RTC in 3 weeks  Medication Adjustments/Labs and Tests Ordered: Current medicines are reviewed at length with the patient today.  Concerns regarding medicines are outlined above.  Orders Placed This Encounter  Procedures   Ambulatory referral to Endocrinology   EKG 12-Lead   Split night study   No orders of the defined types were placed in this encounter.   Patient Instructions  Medication  Instructions:  RESTART labetolol 400 mg two times daily  DO NOT MISS ANY DOSES OF ELIQUIS  *If you need a refill on your cardiac medications before your next appointment, please call your pharmacy*  Testing/Procedures: Your physician has recommended that you have a sleep study. This test records several body functions during sleep, including: brain activity, eye movement, oxygen and carbon dioxide blood levels, heart rate and rhythm, breathing rate and  rhythm, the flow of air through your mouth and nose, snoring, body muscle movements, and chest and belly movement.  Follow-Up: At [facility], you and your health needs are our priority.  As part of our continuing mission to provide you with exceptional heart care, we have created designated Provider Care Teams.  These Care Teams include your primary Cardiologist (physician) and Advanced Practice Providers (APPs -  Physician Assistants and Nurse Practitioners) who all work together to provide you with the care you need, when you need it.  We recommend signing up for the patient portal called "MyChart".  Sign up information is provided on this After Visit Summary.  MyChart is used to connect with patients for Virtual Visits (Telemedicine).  Patients are able to view lab/test results, encounter notes, upcoming appointments, etc.  Non-urgent messages can be sent to your provider as well.   To learn more about what you can do with MyChart, go to [email or web address].    Your next appointment:   Friday 5/28 at 3 pm  The format for your next appointment:   In Person  Provider:   Epifanio Lesches, MD   Other Instructions Number to Endocrinology Dr Everardo All [phone] Number to receive Primary Care [phone]    Signed, Little Ishikawa, MD  01/05/2020 5:56 PM    [facility]

## 2020-01-05 ENCOUNTER — Encounter: Payer: Self-pay | Admitting: Cardiology

## 2020-01-05 ENCOUNTER — Other Ambulatory Visit: Payer: Self-pay

## 2020-01-05 ENCOUNTER — Ambulatory Visit: Payer: Medicaid Other | Admitting: Cardiology

## 2020-01-05 VITALS — BP 162/94 | HR 124 | Ht 71.0 in | Wt 244.6 lb

## 2020-01-05 DIAGNOSIS — I7100 Dissection of unspecified site of aorta: Secondary | ICD-10-CM | POA: Diagnosis not present

## 2020-01-05 DIAGNOSIS — I480 Paroxysmal atrial fibrillation: Secondary | ICD-10-CM

## 2020-01-05 DIAGNOSIS — E269 Hyperaldosteronism, unspecified: Secondary | ICD-10-CM | POA: Diagnosis not present

## 2020-01-05 DIAGNOSIS — R0683 Snoring: Secondary | ICD-10-CM

## 2020-01-05 DIAGNOSIS — I10 Essential (primary) hypertension: Secondary | ICD-10-CM

## 2020-01-05 NOTE — Patient Instructions (Signed)
Medication Instructions:  RESTART labetolol 400 mg two times daily  DO NOT MISS ANY DOSES OF ELIQUIS  *If you need a refill on your cardiac medications before your next appointment, please call your pharmacy*  Testing/Procedures: Your physician has recommended that you have a sleep study. This test records several body functions during sleep, including: brain activity, eye movement, oxygen and carbon dioxide blood levels, heart rate and rhythm, breathing rate and rhythm, the flow of air through your mouth and nose, snoring, body muscle movements, and chest and belly movement.  Follow-Up: At [facility], you and your health needs are our priority.  As part of our continuing mission to provide you with exceptional heart care, we have created designated Provider Care Teams.  These Care Teams include your primary Cardiologist (physician) and Advanced Practice Providers (APPs -  Physician Assistants and Nurse Practitioners) who all work together to provide you with the care you need, when you need it.  We recommend signing up for the patient portal called "MyChart".  Sign up information is provided on this After Visit Summary.  MyChart is used to connect with patients for Virtual Visits (Telemedicine).  Patients are able to view lab/test results, encounter notes, upcoming appointments, etc.  Non-urgent messages can be sent to your provider as well.   To learn more about what you can do with MyChart, go to [email or web address].    Your next appointment:   Friday 5/28 at 3 pm  The format for your next appointment:   In Person  Provider:   Epifanio Lesches, MD   Other Instructions Number to Endocrinology Dr Everardo All [phone] Number to receive Primary Care [phone]

## 2020-01-18 ENCOUNTER — Telehealth (HOSPITAL_COMMUNITY): Payer: Self-pay

## 2020-01-18 NOTE — Telephone Encounter (Signed)

## 2020-01-19 ENCOUNTER — Other Ambulatory Visit: Payer: Self-pay

## 2020-01-19 ENCOUNTER — Ambulatory Visit (INDEPENDENT_AMBULATORY_CARE_PROVIDER_SITE_OTHER): Payer: Medicaid Other | Admitting: Vascular Surgery

## 2020-01-19 ENCOUNTER — Encounter: Payer: Self-pay | Admitting: Vascular Surgery

## 2020-01-19 VITALS — BP 160/111 | HR 79 | Temp 97.5°F | Resp 20 | Ht 71.0 in | Wt 243.0 lb

## 2020-01-19 DIAGNOSIS — I7101 Dissection of thoracic aorta: Secondary | ICD-10-CM | POA: Diagnosis not present

## 2020-01-19 DIAGNOSIS — I71019 Dissection of thoracic aorta, unspecified: Secondary | ICD-10-CM

## 2020-01-19 NOTE — Progress Notes (Signed)
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 male, who is here today for establishing follow-up of extensive prior thoracic aortic disease.  His information was taken from the chart including care everywhere from [facility].  He initially presented with a a sending arch dissection and underwent open repair in 2016.  He apparently had complication at the proximal suture line in December 2019 underwent repeat surgery.  He also underwent stenting of his left common and left external iliac artery and also a stent through the false lumen into his left renal artery in January 2020 apparently this was in preparation for further day branching and exclusion of his thoracic dissection in his descending aorta.  He had multiple complications in including sternal wound infection and also congestive heart failure and acute kidney injury he required CVVHD but eventually had renal function improvement.  He moved to our area in July 2020.  He presented to the emergency room at [facility] on 06/19/2019.  Had a CT scan at that time showing a continued type B dissection.  He is now seen for establishment of care in this area.  He has no malperfusion symptoms.  Past Medical History:  Diagnosis Date  . AAA (abdominal aortic aneurysm) ([place])   . History of open heart surgery   . Hypertension   . Seroma due to trauma [facility])     History reviewed. No pertinent family history.  SOCIAL HISTORY: Social History   Socioeconomic History  . Marital status: Single    Spouse name: Not on file  . Number of children: Not on file  . Years of education: Not on file  . Highest education level: Not on file  Occupational History  . Not on file  Tobacco Use  . Smoking status: Current Every  Day Smoker    Packs/day: 0.50  . Smokeless tobacco: Never Used  Substance and Sexual Activity  . Alcohol use: Never  . Drug use: Never  . Sexual activity: Not on file  Other Topics Concern  . Not on file  Social History Narrative  . Not on file   Social Determinants of Health   Financial Resource Strain:   . Difficulty of Paying Living Expenses:   Food Insecurity:   . Worried About Charity fundraiser in the Last Year:   . Arboriculturist in the Last Year:   Transportation Needs:   . Film/video editor (Medical):   Marland Kitchen Lack of Transportation (Non-Medical):   Physical Activity:   . Days of Exercise per Week:   . Minutes of Exercise per Session:   Stress:   . Feeling of Stress :   Social Connections:   . Frequency of Communication with Friends and Family:   . Frequency of Social Gatherings with Friends and Family:   . Attends Religious Services:   . Active Member of Clubs or Organizations:   . Attends Archivist Meetings:   Marland Kitchen Marital Status:   Intimate Partner Violence:   . Fear of Current or Ex-Partner:   . Emotionally Abused:   Marland Kitchen Physically Abused:   . Sexually Abused:     No Known Allergies  Current Outpatient Medications  Medication Sig Dispense Refill  . ASPIRIN LOW DOSE 81 MG EC tablet Take 81 mg by mouth daily.    Marland Kitchen  ELIQUIS 5 MG TABS tablet Take 1 tablet (5 mg total) by mouth 2 (two) times daily. 180 tablet 3  . labetalol (NORMODYNE) 200 MG tablet Take 2 tablets (400 mg total) by mouth 2 (two) times daily. (Patient taking differently: Take 200 mg by mouth 4 (four) times daily. ) 360 tablet 3  . NIFEdipine (ADALAT CC) 90 MG 24 hr tablet Take 1 tablet (90 mg total) by mouth daily. 90 tablet 2  . spironolactone (ALDACTONE) 25 MG tablet Take 0.5 tablets (12.5 mg total) by mouth daily. 30 tablet 3  . valsartan (DIOVAN) 80 MG tablet Take 1 tablet (80 mg total) by mouth daily. 90 tablet 3   No current facility-administered medications for this visit.     REVIEW OF SYSTEMS:  [X]  denotes positive finding, [ ]  denotes negative finding Cardiac  Comments:  Chest pain or chest pressure:    Shortness of breath upon exertion: x   Short of breath when lying flat:    Irregular heart rhythm:        Vascular    Pain in calf, thigh, or hip brought on by ambulation:    Pain in feet at night that wakes you up from your sleep:  x   Blood clot in your veins:    Leg swelling:  x       Pulmonary    Oxygen at home:    Productive cough:     Wheezing:         Neurologic    Sudden weakness in arms or legs:  x   Sudden numbness in arms or legs:     Sudden onset of difficulty speaking or slurred speech:    Temporary loss of vision in one eye:     Problems with dizziness:  x       Gastrointestinal    Blood in stool:     Vomited blood:         Genitourinary    Burning when urinating:     Blood in urine:        Psychiatric    Major depression:         Hematologic    Bleeding problems:    Problems with blood clotting too easily:        Skin    Rashes or ulcers:        Constitutional    Fever or chills:      PHYSICAL EXAM: Vitals:   01/19/20 1304  BP: (!) 160/111  Pulse: 79  Resp: 20  Temp: (!) 97.5 F (36.4 C)  SpO2: 95%  Weight: 110.2 kg  Height: 5\' 11"  (1.803 m)    GENERAL: The patient is a well-nourished male, in no acute distress. The vital signs are documented above. CARDIOVASCULAR: Carotid arteries without bruits bilaterally.  2+ radial and 2+ dorsalis pedis pulses PULMONARY: There is good air exchange  ABDOMEN: Soft and non-tender  MUSCULOSKELETAL: There are no major deformities or cyanosis. NEUROLOGIC: No focal weakness or paresthesias are detected. SKIN: There are no ulcers or rashes noted. PSYCHIATRIC: The patient has a normal affect.  DATA:  CT scan from 10/20 reviewed with the patient.  This does show hybrid repair of his a sending arch with the proximal thoracic stent and also stent in his left renal  artery and left iliac arteries.  There is no evidence of malperfusion  MEDICAL ISSUES: Treatment a complex aortic case.  Apparently there had been plan for further covering of his dissection by his  treating physicians in [place].  It is unclear as to whether he has had continued dilatation.  He certainly has enlargement of his dissected area of his thoracic aorta.  It has been over 7 months since his CT scan.  I have recommended repeat CT of his chest abdomen and pelvis to determine how stable his dissection is and potentially plan further treatment.  We will see him back in the office for follow-up discussion following the CT scan   Larina Earthly, MD [facility] Vascular and Vein Specialists of [facility] Tel [phone] Pager [phone]

## 2020-01-21 ENCOUNTER — Other Ambulatory Visit (HOSPITAL_BASED_OUTPATIENT_CLINIC_OR_DEPARTMENT_OTHER): Payer: Self-pay

## 2020-01-21 ENCOUNTER — Telehealth: Payer: Self-pay | Admitting: *Deleted

## 2020-01-21 NOTE — Telephone Encounter (Signed)
Patient notified of sleep study and COVID appointments. He agreed to having the sleep study until I told him he has to have a COVID test. He then states that he will have to think about getting a covid test. I told him that if he does not have the covid test, then he cannot have the sleep study. Patient voiced understanding. He will think about it and call back to cancel if he decides not to do it.

## 2020-01-21 NOTE — Telephone Encounter (Signed)
-----   Message from Harvel Ricks, RN sent at 01/05/2020  4:44 PM EDT ----- Regarding: Split night Split night ordered per Dr. Bjorn Pippin.  Sleep scale completed in chart.   Thanks!

## 2020-01-22 ENCOUNTER — Ambulatory Visit: Payer: Medicaid Other | Admitting: Cardiology

## 2020-01-22 NOTE — Progress Notes (Deleted)
Cardiology Office Note:    Date:  01/22/2020   ID:  [name], DOB [birth year], MRN [id number]  PCP:  Patient, No Pcp Per  Cardiologist:  No primary care provider on file.  Electrophysiologist:  None   Referring MD: No ref. provider found   No chief complaint on file.   History of Present Illness:    Connor [name] is a 64 y.o. male with a hx of aortic dissection who presents for follow-up.  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  He underwent left common and external iliac artery and left renal vein stents on 09/19/2018 in preparation for total aortic arch repair.  Course was complicated by cardiogenic shock (LVEF down to 15%, which has since recovered), AKI requiring CVVH (with subsequent recovery of renal function), extensive left upper extremity DVT.  On 10/03/2018, he underwent total arch replacement, frozen elephant trunk, and head vessel reconstruction with Dr.Takayama and Dr. Allena Katz.  Course was complicated by sternal wound infection, with cultures growing Klebsiella.  He completed a 14-day course of cefadroxil.  He was following closely with cardiology and nephrology in [place] for blood pressure control.  Moved to [place] in July to help with a family issue.   He was in the ED on 06/19/2019 with chest tightness and shortness of breath.  Troponins negative x2.  CTA chest showed patent graft of the ascending aortic component, patent graft of the proximal descending aorta with, thrombosis of the proximal descending thoracic aortic dissection false lumen along the stent graft, patent type B aortic dissection of the mid descending thoracic aorta extending to the abdominal aortic bifurcation.  Reports that he feels chest pain has been related to muscle cramps from low potassium.  Now taking potassium supplements.  States that he also has chest pain across the incision in the right upper chest  when he does stretches.  He reports that he saw his dentist about having a tooth extraction done, but was canceled because he was on Eliquis.  TTE on 10/02/2019 showed EF 60-65%, severe LVH, normal RV function, no significant valvular disease.  Since his last clinic visit, he reports that he stopped taking labetalol.  States that he misunderstood instructions and stopped his labetalol when he started valsartan at last clinic visit.  He reports that he continues to have some chest pain around his incision site in right side of chest when he moves his right arm in certain ways.  Otherwise denies any chest pain or dyspnea.  He denies any palpitations, lightheadedness, or syncope.  He is taking Eliquis, denies any bleeding issues.   Past Medical History:  Diagnosis Date  . AAA (abdominal aortic aneurysm) (HCC)   . History of open heart surgery   . Hypertension   . Seroma due to trauma [facility])     Past Surgical History:  Procedure Laterality Date  . ABDOMINAL AORTIC ANEURYSM REPAIR    . CORONARY ARTERY BYPASS GRAFT      Current Medications: No outpatient medications have been marked as taking for the 01/22/20 encounter (Appointment) with Little Ishikawa, MD.     Allergies:   Patient has no known allergies.   Social History   Socioeconomic History  . Marital status: Single    Spouse name: Not on file  . Number of children: Not on file  . Years of education: Not on file  . Highest education level: Not  on file  Occupational History  . Not on file  Tobacco Use  . Smoking status: Current Every Day Smoker    Packs/day: 0.50  . Smokeless tobacco: Never Used  Substance and Sexual Activity  . Alcohol use: Never  . Drug use: Never  . Sexual activity: Not on file  Other Topics Concern  . Not on file  Social History Narrative  . Not on file   Social Determinants of Health   Financial Resource Strain:   . Difficulty of Paying Living Expenses:   Food Insecurity:   . Worried About  Charity fundraiser in the Last Year:   . Arboriculturist in the Last Year:   Transportation Needs:   . Film/video editor (Medical):   Marland Kitchen Lack of Transportation (Non-Medical):   Physical Activity:   . Days of Exercise per Week:   . Minutes of Exercise per Session:   Stress:   . Feeling of Stress :   Social Connections:   . Frequency of Communication with Friends and Family:   . Frequency of Social Gatherings with Friends and Family:   . Attends Religious Services:   . Active Member of Clubs or Organizations:   . Attends Archivist Meetings:   Marland Kitchen Marital Status:      Family History: Mother died of MI at age 74.  Father had stroke at age 12  ROS:   Please see the history of present illness.     All other systems reviewed and are negative.  EKGs/Labs/Other Studies Reviewed:    The following studies were reviewed today:   EKG:  EKG is  ordered today.  The ekg ordered today demonstrates atrial fibrillation, rate 100, incomplete right bundle branch block, Q waves in V1/2  TTE 10/02/19: 1. Left ventricular ejection fraction, by visual estimation, is 60 to  65%. The left ventricle has normal function. Left ventricular septal wall  thickness was severely increased. Severely increased left ventricular  posterior wall thickness. There is  severely increased left ventricular hypertrophy.  2. Left ventricular diastolic function could not be evaluated.  3. The left ventricle has no regional wall motion abnormalities.  4. Global right ventricle has normal systolic function.The right  ventricular size is normal. No increase in right ventricular wall  thickness.  5. Left atrial size was moderately dilated.  6. Right atrial size was normal.  7. The mitral valve is normal in structure. Trivial mitral valve  regurgitation. No evidence of mitral stenosis.  8. The tricuspid valve is normal in structure.  9. The tricuspid valve is normal in structure. Tricuspid valve    regurgitation is not demonstrated.  10. The aortic valve is tricuspid. Aortic valve regurgitation is not  visualized. No evidence of aortic valve sclerosis or stenosis.  11. The pulmonic valve was normal in structure. Pulmonic valve  regurgitation is not visualized.  12. The inferior vena cava is normal in size with greater than 50%  respiratory variability, suggesting right atrial pressure of 3 mmHg.   Recent Labs: 06/19/2019: B Natriuretic Peptide 93.0; Hemoglobin 10.8; Magnesium 2.0; Platelets 202 09/08/2019: ALT 8; NT-Pro BNP 719 12/25/2019: BUN 13; Creatinine, Ser 1.03; Potassium 3.6; Sodium 142  Recent Lipid Panel No results found for: CHOL, TRIG, HDL, CHOLHDL, VLDL, LDLCALC, LDLDIRECT  Physical Exam:    VS:  There were no vitals taken for this visit.    Wt Readings from Last 3 Encounters:  01/19/20 243 lb (110.2 kg)  01/05/20 244 lb  9.6 oz (110.9 kg)  11/20/19 246 lb (111.6 kg)     GEN: Well nourished, well developed in no acute distress HEENT: Normal NECK: No JVD CARDIAC: irregular, 2/6 systolic murmur RESPIRATORY:  Clear to auscultation without rales, wheezing or rhonchi  ABDOMEN: Soft, non-tender, non-distended MUSCULOSKELETAL:  No edema; No deformity  SKIN: Warm and dry NEUROLOGIC:  Alert and oriented x 3 PSYCHIATRIC:  Normal affect   ASSESSMENT:    No diagnosis found. 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  Patient requesting a second opinion on surgery, have referred to surgery here, has appointment later this month -Goal SBP less than 120, heart rate less than 60.    PAF: In AF with RVR today, rates  100-120s.  CHADS-VASc score 2 given HTN, vascular disease.  He does not appear symptomatic. -Continue Eliquis 5 mg twice daily -He had stopped taking labetalol, will restart labetalol 400 mg twice daily -Follow-up in 3 weeks.  If remains in atrial fibrillation, will schedule cardioversion  Hypertension: Currently on nifedipine 90 mg daily, spironolactone 12.5 mg daily, valsartan 80 mg daily, and labetalol 400 mg twice daily.  However, he stopped taking his labetalol.  BP above goal less than SBP 120.   -Restart labetalol 400 mg twice daily -Given resistant hypertension and also issues with persistent hypokalemia, concern for hyperaldosteronism.  Renin/aldosterone ratio >60, have referred to endocrinology for evaluation of hyperaldosteronism -Suspect untreated OSA also contributing to resistant hypertension, will order sleep study  Lower extremity edema: TTE shows normal LVEF, normal RV function, no significant valvular disease  Snoring: Evaluate for OSA with sleep study as above  RTC in 3 weeks  Medication Adjustments/Labs and Tests Ordered: Current medicines are reviewed at length with the patient today.  Concerns regarding medicines are outlined above.  No orders of the defined types were placed in this encounter.  No orders of the defined types were placed in this encounter.   There are no Patient Instructions on file for this visit.   Signed, Little Ishikawa, MD  01/22/2020 3:05 PM    [facility]

## 2020-01-29 ENCOUNTER — Other Ambulatory Visit (HOSPITAL_COMMUNITY)
Admission: RE | Admit: 2020-01-29 | Discharge: 2020-01-29 | Disposition: A | Discharge status: Home / Self Care | Payer: Medicaid Other | Source: Ambulatory Visit | Attending: Cardiology | Admitting: Cardiology

## 2020-02-04 ENCOUNTER — Ambulatory Visit (INDEPENDENT_AMBULATORY_CARE_PROVIDER_SITE_OTHER): Payer: Medicaid Other | Admitting: Cardiology

## 2020-02-04 ENCOUNTER — Encounter: Payer: Self-pay | Admitting: Cardiology

## 2020-02-04 ENCOUNTER — Other Ambulatory Visit: Payer: Self-pay

## 2020-02-04 VITALS — BP 130/70 | HR 78 | Temp 97.0°F | Ht 71.0 in | Wt 242.6 lb

## 2020-02-04 DIAGNOSIS — I10 Essential (primary) hypertension: Secondary | ICD-10-CM | POA: Diagnosis not present

## 2020-02-04 DIAGNOSIS — R0683 Snoring: Secondary | ICD-10-CM

## 2020-02-04 DIAGNOSIS — I7100 Dissection of unspecified site of aorta: Secondary | ICD-10-CM | POA: Diagnosis not present

## 2020-02-04 DIAGNOSIS — I4892 Unspecified atrial flutter: Secondary | ICD-10-CM | POA: Diagnosis not present

## 2020-02-04 NOTE — Patient Instructions (Addendum)
You are scheduled for a Cardioversion on Thursday 6/17 with Dr. Bjorn Pippin.  Please arrive at the [facility] (Main Entrance A) at [facility]: [address] [place], [place] [address] at 10 AM.  DIET: Nothing to eat or drink after midnight except a sip of water with medications (see medication instructions below)  Medication Instructions: Hold spironolactone the morning of procedure  Continue your anticoagulant: Eliquis  You will need to continue your anticoagulant after your procedure until you are told by your  Provider that it is safe to stop   Labs: today in office   COVID TEST:  Monday 6/14 at 2:30 pm [address]   Bonita Quin must have a responsible person to drive you home and stay in the waiting area during your procedure. Failure to do so could result in cancellation.  Bring your insurance cards.  *Special Note: Every effort is made to have your procedure done on time. Occasionally there are emergencies that occur at the hospital that may cause delays. Please be patient if a delay does occur.    Follow up in 2 weeks with pharmacist (htn clinic) 1 month with Dr. Bjorn Pippin

## 2020-02-04 NOTE — H&P (View-Only) (Signed)
Cardiology Office Note:    Date:  02/05/2020   ID:  [name], DOB 1956-08-18, MRN [id number]  PCP:  Patient, No Pcp Per  Cardiologist:  No primary care provider on file.  Electrophysiologist:  None   Referring MD: No ref. provider found   Chief Complaint  Patient presents with  . Hypertension    History of Present Illness:    Connor [name] is a 64 y.o. male with a hx of aortic dissection who presents for follow-up.  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  He underwent left common and external iliac artery and left renal vein stents on 09/19/2018 in preparation for total aortic arch repair.  Course was complicated by cardiogenic shock (LVEF down to 15%, which has since recovered), AKI requiring CVVH (with subsequent recovery of renal function), extensive left upper extremity DVT.  On 10/03/2018, he underwent total arch replacement, frozen elephant trunk, and head vessel reconstruction with Dr.Takayama and Dr. Allena Katz.  Course was complicated by sternal wound infection, with cultures growing Klebsiella.  He completed a 14-day course of cefadroxil.  He was following closely with cardiology and nephrology in [place] for blood pressure control.  Moved to [place] in July to help with a family issue.   He was in the ED on 06/19/2019 with chest tightness and shortness of breath.  Troponins negative x2.  CTA chest showed patent graft of the ascending aortic component, patent graft of the proximal descending aorta with, thrombosis of the proximal descending thoracic aortic dissection false lumen along the stent graft, patent type B aortic dissection of the mid descending thoracic aorta extending to the abdominal aortic bifurcation.  Reports that he feels chest pain has been related to muscle cramps from low potassium.  Now taking potassium supplements.  States that he also has chest pain across the  incision in the right upper chest when he does stretches.  TTE on 10/02/2019 showed EF 60-65%, severe LVH, normal RV function, no significant valvular disease.  A clinic visit on 01/05/2020, he was found to be in atrial fibrillation.  Since his last clinic visit, he reports that he has been doing well.  Continues to have his chronic pain around his incision site in right upper chest but otherwise denies any chest pain.  Denies any dyspnea.  Reports BP at home has been 120-130s.  States that when he first restarted his labetalol his BP was down to 70s and felt lightheaded.  States that he is now tolerating well.  He denies any missed doses of Eliquis in the last 3 weeks.    Past Medical History:  Diagnosis Date  . AAA (abdominal aortic aneurysm) (HCC)   . History of open heart surgery   . Hypertension   . Seroma due to trauma [facility])     Past Surgical History:  Procedure Laterality Date  . ABDOMINAL AORTIC ANEURYSM REPAIR    . CORONARY ARTERY BYPASS GRAFT      Current Medications: Current Meds  Medication Sig  . ASPIRIN LOW DOSE 81 MG EC tablet Take 81 mg by mouth daily.  Marland Kitchen ELIQUIS 5 MG TABS tablet Take 1 tablet (5 mg total) by mouth 2 (two) times daily.  Marland Kitchen labetalol (NORMODYNE) 200 MG tablet Take 2 tablets (400 mg total) by mouth 2 (two) times daily. (Patient taking differently: Take 200 mg by mouth 4 (four) times daily. )  . NIFEdipine (  ADALAT CC) 90 MG 24 hr tablet Take 1 tablet (90 mg total) by mouth daily.  . spironolactone (ALDACTONE) 25 MG tablet Take 0.5 tablets (12.5 mg total) by mouth daily.  . valsartan (DIOVAN) 80 MG tablet Take 1 tablet (80 mg total) by mouth daily.     Allergies:   Patient has no known allergies.   Social History   Socioeconomic History  . Marital status: Single    Spouse name: Not on file  . Number of children: Not on file  . Years of education: Not on file  . Highest education level: Not on file  Occupational History  . Not on file  Tobacco Use   . Smoking status: Current Every Day Smoker    Packs/day: 0.50  . Smokeless tobacco: Never Used  Vaping Use  . Vaping Use: Never used  Substance and Sexual Activity  . Alcohol use: Never  . Drug use: Never  . Sexual activity: Not on file  Other Topics Concern  . Not on file  Social History Narrative  . Not on file   Social Determinants of Health   Financial Resource Strain:   . Difficulty of Paying Living Expenses:   Food Insecurity:   . Worried About Running Out of Food in the Last Year:   . Ran Out of Food in the Last Year:   Transportation Needs:   . Lack of Transportation (Medical):   . Lack of Transportation (Non-Medical):   Physical Activity:   . Days of Exercise per Week:   . Minutes of Exercise per Session:   Stress:   . Feeling of Stress :   Social Connections:   . Frequency of Communication with Friends and Family:   . Frequency of Social Gatherings with Friends and Family:   . Attends Religious Services:   . Active Member of Clubs or Organizations:   . Attends Club or Organization Meetings:   . Marital Status:      Family History: Mother died of MI at age 56.  Father had stroke at age 76  ROS:   Please see the history of present illness.     All other systems reviewed and are negative.  EKGs/Labs/Other Studies Reviewed:    The following studies were reviewed today:   EKG:  EKG is  ordered today.  The ekg ordered today demonstrates atrial atrial flutter with variable conduction, rate 78, PVC  TTE 10/02/19: 1. Left ventricular ejection fraction, by visual estimation, is 60 to  65%. The left ventricle has normal function. Left ventricular septal wall  thickness was severely increased. Severely increased left ventricular  posterior wall thickness. There is  severely increased left ventricular hypertrophy.  2. Left ventricular diastolic function could not be evaluated.  3. The left ventricle has no regional wall motion abnormalities.  4. Global  right ventricle has normal systolic function.The right  ventricular size is normal. No increase in right ventricular wall  thickness.  5. Left atrial size was moderately dilated.  6. Right atrial size was normal.  7. The mitral valve is normal in structure. Trivial mitral valve  regurgitation. No evidence of mitral stenosis.  8. The tricuspid valve is normal in structure.  9. The tricuspid valve is normal in structure. Tricuspid valve  regurgitation is not demonstrated.  10. The aortic valve is tricuspid. Aortic valve regurgitation is not  visualized. No evidence of aortic valve sclerosis or stenosis.  11. The pulmonic valve was normal in structure. Pulmonic valve  regurgitation is   not visualized.  12. The inferior vena cava is normal in size with greater than 50%  respiratory variability, suggesting right atrial pressure of 3 mmHg.   Recent Labs: 06/19/2019: B Natriuretic Peptide 93.0; Magnesium 2.0 09/08/2019: ALT 8; NT-Pro BNP 719 02/04/2020: BUN 16; Creatinine, Ser 1.27; Hemoglobin 11.7; Platelets 229; Potassium 4.0; Sodium 146  Recent Lipid Panel No results found for: CHOL, TRIG, HDL, CHOLHDL, VLDL, LDLCALC, LDLDIRECT  Physical Exam:    VS:  BP 130/70   Pulse 78   Temp (!) 97 F (36.1 C)   Ht 5' 11" (1.803 m)   Wt 242 lb 9.6 oz (110 kg)   SpO2 96%   BMI 33.84 kg/m     Wt Readings from Last 3 Encounters:  02/04/20 242 lb 9.6 oz (110 kg)  01/19/20 243 lb (110.2 kg)  01/05/20 244 lb 9.6 oz (110.9 kg)     GEN: Well nourished, well developed in no acute distress HEENT: Normal NECK: No JVD CARDIAC: irregular, 2/6 systolic murmur RESPIRATORY:  Clear to auscultation without rales, wheezing or rhonchi  ABDOMEN: Soft, non-tender, non-distended MUSCULOSKELETAL:  1+ edema SKIN: Warm and dry NEUROLOGIC:  Alert and oriented x 3 PSYCHIATRIC:  Normal affect   ASSESSMENT:    1. Atrial flutter, unspecified type (HCC)   2. Dissection of aorta, unspecified portion of aorta  (HCC)   3. Essential hypertension   4. Snoring    PLAN:    Aortic dissection: Type A aortic arch dissection status post repair in 2016 with dissection at anastomosis suture lines in 07/2018 status post left common and external iliac artery and left renal vein stents on 09/19/2018 and then total arch replacement, frozen elephant trunk, head vessel reconstruction on 10/03/2018 in [place] -Follows with Dr Early in vascular surgery, repeat CT ordered -Goal SBP less than 120, heart rate less than 60.    AF/AFL: In AFL with variable conduction in clinic today, rates appear controlled on labetalol.  CHADS-VASc score 2 given HTN, vascular disease.  He does not appear symptomatic.  TTE 10/02/2019 shows normal LVEF -Continue Eliquis 5 mg twice daily -Continue labetalol 400 mg twice daily -He denies any missed doses of Eliquis over the last 3 weeks.  Will schedule DCCV  Hypertension: Currently on nifedipine 90 mg daily, spironolactone 12.5 mg daily, valsartan 80 mg daily, and labetalol 400 mg twice daily.   -Continue current medications and will schedule him hypertension clinic for further evaluation -Given resistant hypertension and also issues with persistent hypokalemia, concern for hyperaldosteronism.  Renin/aldosterone ratio >60, have referred to endocrinology for evaluation of hyperaldosteronism -Suspect untreated OSA also contributing to resistant hypertension, sleep study ordered  Snoring: Evaluate for OSA with sleep study as above  RTC in 1 month  Medication Adjustments/Labs and Tests Ordered: Current medicines are reviewed at length with the patient today.  Concerns regarding medicines are outlined above.  Orders Placed This Encounter  Procedures  . Basic metabolic panel  . CBC  . EKG 12-Lead   No orders of the defined types were placed in this encounter.   Patient Instructions  You are scheduled for a Cardioversion on Thursday 6/17 with Dr. Kyler Germer.  Please arrive at the [place] [facility]  (Main Entrance A) at [facility]: [address] [place], [place] [address] at 10 AM.  DIET: Nothing to eat or drink after midnight except a sip of water with medications (see medication instructions below)  Medication Instructions: Hold spironolactone the morning of procedure  Continue your anticoagulant:   Eliquis  You will need to continue your anticoagulant after your procedure until you are told by your  Provider that it is safe to stop   Labs: today in office   COVID TEST:  Monday 6/14 at 2:30 pm [address]   You must have a responsible person to drive you home and stay in the waiting area during your procedure. Failure to do so could result in cancellation.  Bring your insurance cards.  *Special Note: Every effort is made to have your procedure done on time. Occasionally there are emergencies that occur at the hospital that may cause delays. Please be patient if a delay does occur.    Follow up in 2 weeks with pharmacist (htn clinic) 1 month with Dr. Jamoni Hewes     Signed, Alynah Schone L Aydee Mcnew, MD  02/05/2020 5:15 PM    [facility] 

## 2020-02-04 NOTE — Progress Notes (Addendum)
Cardiology Office Note:    Date:  02/05/2020   ID:  Connor [name], DOB 1956-08-18, MRN [id number]  PCP:  Patient, No Pcp Per  Cardiologist:  No primary care provider on file.  Electrophysiologist:  None   Referring MD: No ref. provider found   Chief Complaint  Patient presents with  . Hypertension    History of Present Illness:    Paulino Cork is a 64 y.o. male with a hx of aortic dissection who presents for follow-up.  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  He underwent left common and external iliac artery and left renal vein stents on 09/19/2018 in preparation for total aortic arch repair.  Course was complicated by cardiogenic shock (LVEF down to 15%, which has since recovered), AKI requiring CVVH (with subsequent recovery of renal function), extensive left upper extremity DVT.  On 10/03/2018, he underwent total arch replacement, frozen elephant trunk, and head vessel reconstruction with Dr.Takayama and Dr. Allena Katz.  Course was complicated by sternal wound infection, with cultures growing Klebsiella.  He completed a 14-day course of cefadroxil.  He was following closely with cardiology and nephrology in [place] for blood pressure control.  Moved to [place] in July to help with a family issue.   He was in the ED on 06/19/2019 with chest tightness and shortness of breath.  Troponins negative x2.  CTA chest showed patent graft of the ascending aortic component, patent graft of the proximal descending aorta with, thrombosis of the proximal descending thoracic aortic dissection false lumen along the stent graft, patent type B aortic dissection of the mid descending thoracic aorta extending to the abdominal aortic bifurcation.  Reports that he feels chest pain has been related to muscle cramps from low potassium.  Now taking potassium supplements.  States that he also has chest pain across the  incision in the right upper chest when he does stretches.  TTE on 10/02/2019 showed EF 60-65%, severe LVH, normal RV function, no significant valvular disease.  A clinic visit on 01/05/2020, he was found to be in atrial fibrillation.  Since his last clinic visit, he reports that he has been doing well.  Continues to have his chronic pain around his incision site in right upper chest but otherwise denies any chest pain.  Denies any dyspnea.  Reports BP at home has been 120-130s.  States that when he first restarted his labetalol his BP was down to 70s and felt lightheaded.  States that he is now tolerating well.  He denies any missed doses of Eliquis in the last 3 weeks.    Past Medical History:  Diagnosis Date  . AAA (abdominal aortic aneurysm) (HCC)   . History of open heart surgery   . Hypertension   . Seroma due to trauma [facility])     Past Surgical History:  Procedure Laterality Date  . ABDOMINAL AORTIC ANEURYSM REPAIR    . CORONARY ARTERY BYPASS GRAFT      Current Medications: Current Meds  Medication Sig  . ASPIRIN LOW DOSE 81 MG EC tablet Take 81 mg by mouth daily.  Marland Kitchen ELIQUIS 5 MG TABS tablet Take 1 tablet (5 mg total) by mouth 2 (two) times daily.  Marland Kitchen labetalol (NORMODYNE) 200 MG tablet Take 2 tablets (400 mg total) by mouth 2 (two) times daily. (Patient taking differently: Take 200 mg by mouth 4 (four) times daily. )  . NIFEdipine (  ADALAT CC) 90 MG 24 hr tablet Take 1 tablet (90 mg total) by mouth daily.  Marland Kitchen spironolactone (ALDACTONE) 25 MG tablet Take 0.5 tablets (12.5 mg total) by mouth daily.  . valsartan (DIOVAN) 80 MG tablet Take 1 tablet (80 mg total) by mouth daily.     Allergies:   Patient has no known allergies.   Social History   Socioeconomic History  . Marital status: Single    Spouse name: Not on file  . Number of children: Not on file  . Years of education: Not on file  . Highest education level: Not on file  Occupational History  . Not on file  Tobacco Use   . Smoking status: Current Every Day Smoker    Packs/day: 0.50  . Smokeless tobacco: Never Used  Vaping Use  . Vaping Use: Never used  Substance and Sexual Activity  . Alcohol use: Never  . Drug use: Never  . Sexual activity: Not on file  Other Topics Concern  . Not on file  Social History Narrative  . Not on file   Social Determinants of Health   Financial Resource Strain:   . Difficulty of Paying Living Expenses:   Food Insecurity:   . Worried About Programme researcher, broadcasting/film/video in the Last Year:   . Barista in the Last Year:   Transportation Needs:   . Freight forwarder (Medical):   Marland Kitchen Lack of Transportation (Non-Medical):   Physical Activity:   . Days of Exercise per Week:   . Minutes of Exercise per Session:   Stress:   . Feeling of Stress :   Social Connections:   . Frequency of Communication with Friends and Family:   . Frequency of Social Gatherings with Friends and Family:   . Attends Religious Services:   . Active Member of Clubs or Organizations:   . Attends Banker Meetings:   Marland Kitchen Marital Status:      Family History: Mother died of MI at age 10.  Father had stroke at age 27  ROS:   Please see the history of present illness.     All other systems reviewed and are negative.  EKGs/Labs/Other Studies Reviewed:    The following studies were reviewed today:   EKG:  EKG is  ordered today.  The ekg ordered today demonstrates atrial atrial flutter with variable conduction, rate 78, PVC  TTE 10/02/19: 1. Left ventricular ejection fraction, by visual estimation, is 60 to  65%. The left ventricle has normal function. Left ventricular septal wall  thickness was severely increased. Severely increased left ventricular  posterior wall thickness. There is  severely increased left ventricular hypertrophy.  2. Left ventricular diastolic function could not be evaluated.  3. The left ventricle has no regional wall motion abnormalities.  4. Global  right ventricle has normal systolic function.The right  ventricular size is normal. No increase in right ventricular wall  thickness.  5. Left atrial size was moderately dilated.  6. Right atrial size was normal.  7. The mitral valve is normal in structure. Trivial mitral valve  regurgitation. No evidence of mitral stenosis.  8. The tricuspid valve is normal in structure.  9. The tricuspid valve is normal in structure. Tricuspid valve  regurgitation is not demonstrated.  10. The aortic valve is tricuspid. Aortic valve regurgitation is not  visualized. No evidence of aortic valve sclerosis or stenosis.  11. The pulmonic valve was normal in structure. Pulmonic valve  regurgitation is  not visualized.  12. The inferior vena cava is normal in size with greater than 50%  respiratory variability, suggesting right atrial pressure of 3 mmHg.   Recent Labs: 06/19/2019: B Natriuretic Peptide 93.0; Magnesium 2.0 09/08/2019: ALT 8; NT-Pro BNP 719 02/04/2020: BUN 16; Creatinine, Ser 1.27; Hemoglobin 11.7; Platelets 229; Potassium 4.0; Sodium 146  Recent Lipid Panel No results found for: CHOL, TRIG, HDL, CHOLHDL, VLDL, LDLCALC, LDLDIRECT  Physical Exam:    VS:  BP 130/70   Pulse 78   Temp (!) 97 F (36.1 C)   Ht 5\' 11"  (1.803 m)   Wt 242 lb 9.6 oz (110 kg)   SpO2 96%   BMI 33.84 kg/m     Wt Readings from Last 3 Encounters:  02/04/20 242 lb 9.6 oz (110 kg)  01/19/20 243 lb (110.2 kg)  01/05/20 244 lb 9.6 oz (110.9 kg)     GEN: Well nourished, well developed in no acute distress HEENT: Normal NECK: No JVD CARDIAC: irregular, 2/6 systolic murmur RESPIRATORY:  Clear to auscultation without rales, wheezing or rhonchi  ABDOMEN: Soft, non-tender, non-distended MUSCULOSKELETAL:  1+ edema SKIN: Warm and dry NEUROLOGIC:  Alert and oriented x 3 PSYCHIATRIC:  Normal affect   ASSESSMENT:    1. Atrial flutter, unspecified type ([place])   2. Dissection of aorta, unspecified portion of aorta  ([place])   3. Essential hypertension   4. Snoring    PLAN:    Aortic dissection: Type A aortic arch dissection status post repair in 2016 with dissection at anastomosis suture lines in 07/2018 status post left common and external iliac artery and left renal vein stents on 09/19/2018 and then total arch replacement, frozen elephant trunk, head vessel reconstruction on 10/03/2018 in [place] with Dr Early in vascular surgery, repeat CT ordered -Goal SBP less than 120, heart rate less than 60.    AF/AFL: In AFL with variable conduction in clinic today, rates appear controlled on labetalol.  CHADS-VASc score 2 given HTN, vascular disease.  He does not appear symptomatic.  TTE 10/02/2019 shows normal LVEF -Continue Eliquis 5 mg twice daily -Continue labetalol 400 mg twice daily -He denies any missed doses of Eliquis over the last 3 weeks.  Will schedule DCCV  Hypertension: Currently on nifedipine 90 mg daily, spironolactone 12.5 mg daily, valsartan 80 mg daily, and labetalol 400 mg twice daily.   -Continue current medications and will schedule him hypertension clinic for further evaluation -Given resistant hypertension and also issues with persistent hypokalemia, concern for hyperaldosteronism.  Renin/aldosterone ratio >60, have referred to endocrinology for evaluation of hyperaldosteronism -Suspect untreated OSA also contributing to resistant hypertension, sleep study ordered  Snoring: Evaluate for OSA with sleep study as above  RTC in 1 month  Medication Adjustments/Labs and Tests Ordered: Current medicines are reviewed at length with the patient today.  Concerns regarding medicines are outlined above.  Orders Placed This Encounter  Procedures  . Basic metabolic panel  . CBC  . EKG 12-Lead   No orders of the defined types were placed in this encounter.   Patient Instructions  You are scheduled for a Cardioversion on Thursday 6/17 with Dr. Gardiner Rhyme.  Please arrive at the [facility]  (Main Entrance A) at [facility]: [address] [place], [place] [address] at 10 AM.  DIET: Nothing to eat or drink after midnight except a sip of water with medications (see medication instructions below)  Medication Instructions: Hold spironolactone the morning of procedure  Continue your anticoagulant:  Eliquis  You will need to continue your anticoagulant after your procedure until you are told by your  Provider that it is safe to stop   Labs: today in office   COVID TEST:  Monday 6/14 at 2:30 pm [address]   Bonita Quin must have a responsible person to drive you home and stay in the waiting area during your procedure. Failure to do so could result in cancellation.  Bring your insurance cards.  *Special Note: Every effort is made to have your procedure done on time. Occasionally there are emergencies that occur at the hospital that may cause delays. Please be patient if a delay does occur.    Follow up in 2 weeks with pharmacist (htn clinic) 1 month with Dr. Bjorn Pippin     Signed, Little Ishikawa, MD  02/05/2020 5:15 PM    [facility]

## 2020-02-05 LAB — BASIC METABOLIC PANEL
BUN/Creatinine Ratio: 13 (ref 10–24)
BUN: 16 mg/dL (ref 8–27)
CO2: 24 mmol/L (ref 20–29)
Calcium: 8.8 mg/dL (ref 8.6–10.2)
Chloride: 108 mmol/L — ABNORMAL HIGH (ref 96–106)
Creatinine, Ser: 1.27 mg/dL (ref 0.76–1.27)
GFR calc Af Amer: 70 mL/min/{1.73_m2} (ref >59)
GFR calc non Af Amer: 60 mL/min/{1.73_m2} (ref >59)
Glucose: 80 mg/dL (ref 65–99)
Potassium: 4 mmol/L (ref 3.5–5.2)
Sodium: 146 mmol/L — ABNORMAL HIGH (ref 134–144)

## 2020-02-05 LAB — CBC
Hematocrit: 36.5 % — ABNORMAL LOW (ref 37.5–51.0)
Hemoglobin: 11.7 g/dL — ABNORMAL LOW (ref 13.0–17.7)
MCH: 29.2 pg (ref 26.6–33.0)
MCHC: 32.1 g/dL (ref 31.5–35.7)
MCV: 91 fL (ref 79–97)
Platelets: 229 10*3/uL (ref 150–450)
RBC: 4.01 x10E6/uL — ABNORMAL LOW (ref 4.14–5.80)
RDW: 13.4 % (ref 11.6–15.4)
WBC: 6.4 10*3/uL (ref 3.4–10.8)

## 2020-02-08 ENCOUNTER — Other Ambulatory Visit (HOSPITAL_COMMUNITY)
Admission: RE | Admit: 2020-02-08 | Discharge: 2020-02-08 | Disposition: A | Discharge status: Home / Self Care | Payer: Medicaid Other | Source: Ambulatory Visit | Attending: Cardiology | Admitting: Cardiology

## 2020-02-08 ENCOUNTER — Other Ambulatory Visit: Payer: Self-pay | Admitting: *Deleted

## 2020-02-08 DIAGNOSIS — Z01812 Encounter for preprocedural laboratory examination: Secondary | ICD-10-CM | POA: Insufficient documentation

## 2020-02-08 DIAGNOSIS — Z20822 Contact with and (suspected) exposure to covid-19: Secondary | ICD-10-CM | POA: Insufficient documentation

## 2020-02-08 LAB — SARS CORONAVIRUS 2 (TAT 6-24 HRS): SARS Coronavirus 2: NEGATIVE

## 2020-02-09 ENCOUNTER — Ambulatory Visit (HOSPITAL_COMMUNITY): Admission: RE | Admit: 2020-02-09 | Payer: Medicaid Other | Source: Ambulatory Visit

## 2020-02-09 ENCOUNTER — Ambulatory Visit (HOSPITAL_COMMUNITY): Payer: Medicaid Other

## 2020-02-10 NOTE — Progress Notes (Signed)
Pre call done. Patient will be at hospital tomorrow at 10am for 11am cardioversion. Patient still taking blood thinner. Instructed patient to continue taking blood thinner.

## 2020-02-11 ENCOUNTER — Ambulatory Visit (HOSPITAL_COMMUNITY): Payer: Medicaid Other | Admitting: Certified Registered Nurse Anesthetist

## 2020-02-11 ENCOUNTER — Encounter (HOSPITAL_COMMUNITY)
Admission: RE | Disposition: A | Discharge status: Home / Self Care | Payer: Medicaid Other | Source: Home / Self Care | Attending: Cardiology

## 2020-02-11 ENCOUNTER — Ambulatory Visit (HOSPITAL_COMMUNITY)
Admission: RE | Admit: 2020-02-11 | Discharge: 2020-02-11 | Disposition: A | Discharge status: Home / Self Care | Payer: Medicaid Other | Attending: Cardiology | Admitting: Cardiology

## 2020-02-11 ENCOUNTER — Other Ambulatory Visit: Payer: Self-pay

## 2020-02-11 ENCOUNTER — Encounter (HOSPITAL_COMMUNITY): Payer: Self-pay | Admitting: Cardiology

## 2020-02-11 DIAGNOSIS — F17210 Nicotine dependence, cigarettes, uncomplicated: Secondary | ICD-10-CM | POA: Diagnosis not present

## 2020-02-11 DIAGNOSIS — I4891 Unspecified atrial fibrillation: Secondary | ICD-10-CM | POA: Insufficient documentation

## 2020-02-11 DIAGNOSIS — I10 Essential (primary) hypertension: Secondary | ICD-10-CM | POA: Insufficient documentation

## 2020-02-11 DIAGNOSIS — Z7901 Long term (current) use of anticoagulants: Secondary | ICD-10-CM | POA: Diagnosis not present

## 2020-02-11 DIAGNOSIS — Z951 Presence of aortocoronary bypass graft: Secondary | ICD-10-CM | POA: Diagnosis not present

## 2020-02-11 DIAGNOSIS — I4819 Other persistent atrial fibrillation: Secondary | ICD-10-CM | POA: Diagnosis not present

## 2020-02-11 DIAGNOSIS — Z79899 Other long term (current) drug therapy: Secondary | ICD-10-CM | POA: Insufficient documentation

## 2020-02-11 DIAGNOSIS — R0683 Snoring: Secondary | ICD-10-CM | POA: Diagnosis not present

## 2020-02-11 DIAGNOSIS — Z7982 Long term (current) use of aspirin: Secondary | ICD-10-CM | POA: Insufficient documentation

## 2020-02-11 HISTORY — PX: CARDIOVERSION: SHX1299

## 2020-02-11 SURGERY — CARDIOVERSION
Anesthesia: General

## 2020-02-11 MED ORDER — SODIUM CHLORIDE 0.9 % IV SOLN: INTRAVENOUS | Status: DC | PRN

## 2020-02-11 MED ORDER — LIDOCAINE 2% (20 MG/ML) 5 ML SYRINGE
INTRAMUSCULAR | Status: DC | PRN
  Administered 2020-02-11: 40 mg via INTRAVENOUS

## 2020-02-11 MED ORDER — PROPOFOL 10 MG/ML IV BOLUS
INTRAVENOUS | Status: DC | PRN
  Administered 2020-02-11: 90 mg via INTRAVENOUS

## 2020-02-11 NOTE — Interval H&P Note (Signed)
History and Physical Interval Note:  02/11/2020 10:37 AM  Connor [name]  has presented today for surgery, with the diagnosis of AFLUTTER.  The various methods of treatment have been discussed with the patient and family. After consideration of risks, benefits and other options for treatment, the patient has consented to  Procedure(s): CARDIOVERSION (N/A) as a surgical intervention.  The patient's history has been reviewed, patient examined, no change in status, stable for surgery.  I have reviewed the patient's chart and labs.  Questions were answered to the patient's satisfaction.     Little Ishikawa

## 2020-02-11 NOTE — Anesthesia Postprocedure Evaluation (Signed)
Anesthesia Post Note  Patient: Connor [name]  Procedure(s) Performed: CARDIOVERSION (N/A )     Patient location during evaluation: Endoscopy Anesthesia Type: General Level of consciousness: awake Pain management: pain level controlled Vital Signs Assessment: post-procedure vital signs reviewed and stable Respiratory status: spontaneous breathing Cardiovascular status: stable Anesthetic complications: no   No complications documented.  Last Vitals:  Vitals:   02/11/20 1130 02/11/20 1131  BP: (!) 162/92   Pulse: 64 68  Resp: 19 20  Temp:    SpO2: 95% 96%    Last Pain:  Vitals:   02/11/20 1131  TempSrc:   PainSc: 0-No pain                 [name]

## 2020-02-11 NOTE — Anesthesia Procedure Notes (Signed)
Procedure Name: MAC Date/Time: 02/11/2020 10:48 AM Performed by: Inda Coke, CRNA Pre-anesthesia Checklist: Patient identified, Emergency Drugs available, Suction available, Timeout performed and Patient being monitored Patient Re-evaluated:Patient Re-evaluated prior to induction Oxygen Delivery Method: Ambu bag Preoxygenation: Pre-oxygenation with 100% oxygen Induction Type: IV induction Dental Injury: Teeth and Oropharynx as per pre-operative assessment

## 2020-02-11 NOTE — Transfer of Care (Signed)
Immediate Anesthesia Transfer of Care Note  Patient: Connor [name]  Procedure(s) Performed: CARDIOVERSION (N/A )  Patient Location: PACU and Endoscopy Unit  Anesthesia Type:General  Level of Consciousness: awake and drowsy  Airway & Oxygen Therapy: Patient Spontanous Breathing  Post-op Assessment: Report given to RN and Post -op Vital signs reviewed and stable  Post vital signs: Reviewed and stable  Last Vitals:  Vitals Value Taken Time  BP 165/93 02/11/20 1102  Temp    Pulse 63 02/11/20 1103  Resp 20 02/11/20 1103  SpO2 93 % 02/11/20 1103    Last Pain:  Vitals:   02/11/20 0920  TempSrc: Oral  PainSc: 0-No pain         Complications: No complications documented.

## 2020-02-11 NOTE — Discharge Instructions (Signed)
Electrical Cardioversion Electrical cardioversion is the delivery of a jolt of electricity to restore a normal rhythm to the heart. A rhythm that is too fast or is not regular keeps the heart from pumping well. In this procedure, sticky patches or metal paddles are placed on the chest to deliver electricity to the heart from a device. This procedure may be done in an emergency if:  There is low or no blood pressure as a result of the heart rhythm.  Normal rhythm must be restored as fast as possible to protect the brain and heart from further damage.  It may save a life. This may also be a scheduled procedure for irregular or fast heart rhythms that are not immediately life-threatening. Tell a health care provider about:  Any allergies you have.  All medicines you are taking, including vitamins, herbs, eye drops, creams, and over-the-counter medicines.  Any problems you or family members have had with anesthetic medicines.  Any blood disorders you have.  Any surgeries you have had.  Any medical conditions you have.  Whether you are pregnant or may be pregnant. What are the risks? Generally, this is a safe procedure. However, problems may occur, including:  Allergic reactions to medicines.  A blood clot that breaks free and travels to other parts of your body.  The possible return of an abnormal heart rhythm within hours or days after the procedure.  Your heart stopping (cardiac arrest). This is rare. What happens before the procedure? Medicines  Your health care provider may have you start taking: ? Blood-thinning medicines (anticoagulants) so your blood does not clot as easily. ? Medicines to help stabilize your heart rate and rhythm.  Ask your health care provider about: ? Changing or stopping your regular medicines. This is especially important if you are taking diabetes medicines or blood thinners. ? Taking medicines such as aspirin and ibuprofen. These medicines can  thin your blood. Do not take these medicines unless your health care provider tells you to take them. ? Taking over-the-counter medicines, vitamins, herbs, and supplements. General instructions  Follow instructions from your health care provider about eating or drinking restrictions.  Plan to have someone take you home from the hospital or clinic.  If you will be going home right after the procedure, plan to have someone with you for 24 hours.  Ask your health care provider what steps will be taken to help prevent infection. These may include washing your skin with a germ-killing soap. What happens during the procedure?   An IV will be inserted into one of your veins.  Sticky patches (electrodes) or metal paddles may be placed on your chest.  You will be given a medicine to help you relax (sedative).  An electrical shock will be delivered. The procedure may vary among health care providers and hospitals. What can I expect after the procedure?  Your blood pressure, heart rate, breathing rate, and blood oxygen level will be monitored until you leave the hospital or clinic.  Your heart rhythm will be watched to make sure it does not change.  You may have some redness on the skin where the shocks were given. Follow these instructions at home:  Do not drive for 24 hours if you were given a sedative during your procedure.  Take over-the-counter and prescription medicines only as told by your health care provider.  Ask your health care provider how to check your pulse. Check it often.  Rest for 48 hours after the procedure or   as told by your health care provider.  Avoid or limit your caffeine use as told by your health care provider.  Keep all follow-up visits as told by your health care provider. This is important. Contact a health care provider if:  You feel like your heart is beating too quickly or your pulse is not regular.  You have a serious muscle cramp that does not go  away. Get help right away if:  You have discomfort in your chest.  You are dizzy or you feel faint.  You have trouble breathing or you are short of breath.  Your speech is slurred.  You have trouble moving an arm or leg on one side of your body.  Your fingers or toes turn cold or blue. Summary  Electrical cardioversion is the delivery of a jolt of electricity to restore a normal rhythm to the heart.  This procedure may be done right away in an emergency or may be a scheduled procedure if the condition is not an emergency.  Generally, this is a safe procedure.  After the procedure, check your pulse often as told by your health care provider. This information is not intended to replace advice given to you by your health care provider. Make sure you discuss any questions you have with your health care provider. Document Revised: 03/16/2019 Document Reviewed: 03/16/2019 Elsevier Patient Education  2020 Elsevier Inc.  

## 2020-02-11 NOTE — CV Procedure (Signed)
Procedure:   DCCV  Indication:  Symptomatic atrial fibrillation  Procedure Note:  The patient signed informed consent.  They have had had therapeutic anticoagulation with Eliquis greater than 3 weeks.  Anesthesia was administered by Dr. Chilton Si.  Adequate airway was maintained throughout and vital followed per protocol.  They were cardioverted x 1 with 200J of biphasic synchronized energy.  They converted to NSR with rate 60s.  There were no apparent complications.  The patient had normal neuro status and respiratory status post procedure with vitals stable as recorded elsewhere.    Follow up:  They will continue on current medical therapy and follow up with cardiology as scheduled.  Epifanio Lesches, MD 02/11/2020 11:05 AM

## 2020-02-11 NOTE — Anesthesia Preprocedure Evaluation (Addendum)
Anesthesia Evaluation  Patient identified by MRN, date of birth, ID band Patient awake    Reviewed: Allergy & Precautions, NPO status , Patient's Chart, lab work & pertinent test results, reviewed documented beta blocker date and time   Airway Mallampati: III  TM Distance: >3 FB Neck ROM: Full    Dental   Pulmonary Current Smoker and Patient abstained from smoking.,    breath sounds clear to auscultation       Cardiovascular hypertension, Pt. on home beta blockers and Pt. on medications + CABG   Rhythm:Irregular Rate:Normal  AAA repair   Neuro/Psych    GI/Hepatic   Endo/Other    Renal/GU      Musculoskeletal   Abdominal   Peds  Hematology   Anesthesia Other Findings   Reproductive/Obstetrics                           Anesthesia Physical Anesthesia Plan  ASA: III  Anesthesia Plan: General   Post-op Pain Management:    Induction:   PONV Risk Score and Plan: Propofol infusion  Airway Management Planned: Nasal Cannula and Simple Face Mask  Additional Equipment:   Intra-op Plan:   Post-operative Plan:   Informed Consent: I have reviewed the patients History and Physical, chart, labs and discussed the procedure including the risks, benefits and alternatives for the proposed anesthesia with the patient or authorized representative who has indicated his/her understanding and acceptance.     Dental advisory given  Plan Discussed with:   Anesthesia Plan Comments:         Anesthesia Quick Evaluation

## 2020-02-15 ENCOUNTER — Telehealth (HOSPITAL_COMMUNITY): Payer: Self-pay

## 2020-02-15 NOTE — Telephone Encounter (Signed)

## 2020-02-16 ENCOUNTER — Ambulatory Visit: Payer: Medicaid Other | Admitting: Vascular Surgery

## 2020-02-18 ENCOUNTER — Ambulatory Visit: Payer: Medicaid Other

## 2020-02-18 NOTE — Progress Notes (Deleted)
Patient ID: [name]                 DOB: 18-May-1956                      MRN: [id number]     HPI: Connor [name] is a 64 y.o. male referred by Dr. Bjorn Pippin to HTN clinic. PMH is significant for atrial flutter, atrial fibrillation aortic dissection, HTN, and suspected sleep apnea.  Sleep study pending.  Current HTN meds: nifedipine ER 90 mg daily, spironolactone 12.5mg  daily, valsartan 80 mg daily, labetalol 400 mg BID Previously tried: clonidine 0.2 mg patch, 0.3mg  patch BP goal: <120/80  Family History:   Social History:   Diet:   Exercise:   Home BP readings:   Wt Readings from Last 3 Encounters:  02/11/20 242 lb 9.6 oz (110 kg)  02/04/20 242 lb 9.6 oz (110 kg)  01/19/20 243 lb (110.2 kg)   BP Readings from Last 3 Encounters:  02/11/20 (!) 162/92  02/04/20 130/70  01/19/20 (!) 160/111   Pulse Readings from Last 3 Encounters:  02/11/20 68  02/04/20 78  01/19/20 79    Renal function: Estimated Creatinine Clearance: 76.1 mL/min (by C-G formula based on SCr of 1.27 mg/dL).  Past Medical History:  Diagnosis Date  . AAA (abdominal aortic aneurysm) (HCC)   . History of open heart surgery   . Hypertension   . Seroma due to trauma [facility])     Current Outpatient Medications on File Prior to Visit  Medication Sig Dispense Refill  . ELIQUIS 5 MG TABS tablet Take 1 tablet (5 mg total) by mouth 2 (two) times daily. 180 tablet 3  . labetalol (NORMODYNE) 200 MG tablet Take 2 tablets (400 mg total) by mouth 2 (two) times daily. (Patient taking differently: Take 200 mg by mouth 2 (two) times daily. ) 360 tablet 3  . Multiple Vitamins-Minerals (MULTIVITAMIN WITH MINERALS) tablet Take 1 tablet by mouth daily. Men    . NIFEdipine (ADALAT CC) 90 MG 24 hr tablet Take 1 tablet (90 mg total) by mouth daily. 90 tablet 2  . potassium chloride SA (KLOR-CON) 20 MEQ tablet Take 20 mEq by mouth daily as needed (Chest pain).     Marland Kitchen spironolactone (ALDACTONE) 25 MG tablet Take 0.5 tablets  (12.5 mg total) by mouth daily. 30 tablet 3  . valsartan (DIOVAN) 80 MG tablet Take 1 tablet (80 mg total) by mouth daily. 90 tablet 3   No current facility-administered medications on file prior to visit.    No Known Allergies   Assessment/Plan:  1. Hypertension -

## 2020-03-01 ENCOUNTER — Other Ambulatory Visit: Payer: Self-pay

## 2020-03-01 ENCOUNTER — Ambulatory Visit (HOSPITAL_COMMUNITY): Admission: RE | Admit: 2020-03-01 | Payer: Medicaid Other | Source: Ambulatory Visit

## 2020-03-01 ENCOUNTER — Ambulatory Visit (HOSPITAL_COMMUNITY)
Admission: RE | Admit: 2020-03-01 | Discharge: 2020-03-01 | Disposition: A | Discharge status: Home / Self Care | Payer: Medicaid Other | Source: Ambulatory Visit | Attending: Vascular Surgery | Admitting: Vascular Surgery

## 2020-03-01 DIAGNOSIS — I71019 Dissection of thoracic aorta, unspecified: Secondary | ICD-10-CM

## 2020-03-01 DIAGNOSIS — I7101 Dissection of thoracic aorta: Secondary | ICD-10-CM | POA: Diagnosis present

## 2020-03-01 MED ORDER — IOHEXOL 350 MG/ML SOLN
100.0000 mL | Freq: Once | INTRAVENOUS | Status: AC | PRN
  Administered 2020-03-01: 100 mL via INTRAVENOUS

## 2020-03-08 ENCOUNTER — Ambulatory Visit: Payer: Medicaid Other | Admitting: Cardiology

## 2020-03-08 ENCOUNTER — Ambulatory Visit: Payer: Medicaid Other | Admitting: Vascular Surgery

## 2020-04-05 ENCOUNTER — Ambulatory Visit (INDEPENDENT_AMBULATORY_CARE_PROVIDER_SITE_OTHER): Payer: Medicaid Other | Admitting: Vascular Surgery

## 2020-04-05 ENCOUNTER — Ambulatory Visit: Payer: Medicaid Other | Admitting: Vascular Surgery

## 2020-04-05 ENCOUNTER — Encounter: Payer: Self-pay | Admitting: Vascular Surgery

## 2020-04-05 ENCOUNTER — Other Ambulatory Visit: Payer: Self-pay

## 2020-04-05 VITALS — BP 168/94 | HR 85 | Temp 97.7°F | Resp 18 | Ht 71.0 in | Wt 247.9 lb

## 2020-04-05 DIAGNOSIS — I71019 Dissection of thoracic aorta, unspecified: Secondary | ICD-10-CM

## 2020-04-05 DIAGNOSIS — I7101 Dissection of thoracic aorta: Secondary | ICD-10-CM

## 2020-04-05 NOTE — Progress Notes (Signed)
Vascular and Vein Specialist of [facility]  Patient name: Connor [name] MRN: [id number] DOB: March 07, 1956 Sex: male  REASON FOR VISIT: Follow-up thoracic dissection  HPI: Connor [name] is a 64 y.o. male here today for follow-up.  I saw the patient initially in May of this year.  He did moved from [place].  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  Had developed renal failure which resolved.  Also had a sternal wound infection which resolved.  Apparently there had been plan for staged surgery.  When I saw him I did not see any indication for any other intervention.  He appeared to have a stable dissection and no malperfusion.  His most recent CT has been in October 2020 and I recommended repeat CT scan to determine if there have been any progression.  He has had no evidence of malperfusion and has no new medical problems.  Past Medical History:  Diagnosis Date  . AAA (abdominal aortic aneurysm) (HCC)   . History of open heart surgery   . Hypertension   . Seroma due to trauma [facility])     History reviewed. No pertinent family history.  SOCIAL HISTORY: Social History   Tobacco Use  . Smoking status: Current Every Day Smoker    Packs/day: 0.50  . Smokeless tobacco: Never Used  Substance Use Topics  . Alcohol use: Never    No Known Allergies  Current Outpatient Medications  Medication Sig Dispense Refill  . ELIQUIS 5 MG TABS tablet Take 1 tablet (5 mg total) by mouth 2 (two) times daily. 180 tablet 3  . labetalol (NORMODYNE) 200 MG tablet Take 2 tablets (400 mg total) by mouth 2 (two) times daily. (Patient taking differently: Take 200 mg by mouth 2 (two) times daily. ) 360 tablet 3  . Multiple Vitamins-Minerals (MULTIVITAMIN WITH MINERALS) tablet Take 1 tablet by mouth daily. Men    . NIFEdipine (ADALAT CC) 90 MG 24 hr tablet Take 1 tablet (90 mg total)  by mouth daily. 90 tablet 2  . potassium chloride SA (KLOR-CON) 20 MEQ tablet Take 20 mEq by mouth daily as needed (Chest pain).     Marland Kitchen spironolactone (ALDACTONE) 25 MG tablet Take 0.5 tablets (12.5 mg total) by mouth daily. 30 tablet 3  . valsartan (DIOVAN) 80 MG tablet Take 1 tablet (80 mg total) by mouth daily. 90 tablet 3   No current facility-administered medications for this visit.    REVIEW OF SYSTEMS:  [X]  denotes positive finding, [ ]  denotes negative finding Cardiac  Comments:  Chest pain or chest pressure:    Shortness of breath upon exertion:    Short of breath when lying flat:    Irregular heart rhythm:        Vascular    Pain in calf, thigh, or hip brought on by ambulation:    Pain in feet at night that wakes you up from your sleep:     Blood clot in your veins:    Leg swelling:           PHYSICAL EXAM: Vitals:   04/05/20 1150  BP: (!) 168/94  Pulse: 85  Resp: 18  Temp: 97.7 F (36.5 C)  TempSrc: Temporal  SpO2: 96%  Weight: 247 lb 14.4 oz (112.4 kg)  Height: 5\' 11"  (1.803 m)    GENERAL: The patient is a well-nourished male, in no acute distress. The vital signs are documented above. CARDIOVASCULAR:  2+ radial pulses bilaterally PULMONARY: There is good air exchange  MUSCULOSKELETAL: There are no major deformities or cyanosis. NEUROLOGIC: No focal weakness or paresthesias are detected. SKIN: There are no ulcers or rashes noted. PSYCHIATRIC: The patient has a normal affect.  DATA:  CT scan of his chest abdomen and pelvis was reviewed with the patient.  This was from 03/01/2020.  This shows stable dissection.  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  Maximal diameter decreased from 6.0-5.4 in the chest.  Also decreased from 4.9-4.5 at the level of the celiac artery in the abdomen  MEDICAL ISSUES: Stable overall.  No evidence of malperfusion and actual diminished size of his native  aortic wall with the dissection.  Has flow to his renal artery which is stented and iliac vessels.  Recommend continued nonoperative follow-up.  He will be seen again in 1 year with repeat CT scan of his chest abdomen and pelvis    Larina Earthly, MD [facility] Vascular and Vein Specialists of [facility] Tel [phone] Pager [phone]

## 2020-04-28 NOTE — Progress Notes (Signed)
Cardiology Office Note:    Date:  04/29/2020   ID:  Connor [name], DOB Mar 11, 1956, MRN [id number]  PCP:  Patient, No Pcp Per  Cardiologist:  No primary care provider on file.  Electrophysiologist:  None   Referring MD: No ref. provider found   Chief Complaint  Patient presents with  . Hypertension    History of Present Illness:    Fernado [name] is a 64 y.o. male with a hx of aortic dissection who presents for follow-up.  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  He underwent left common and external iliac artery and left renal vein stents on 09/19/2018 in preparation for total aortic arch repair.  Course was complicated by cardiogenic shock (LVEF down to 15%, which has since recovered), AKI requiring CVVH (with subsequent recovery of renal function), extensive left upper extremity DVT.  On 10/03/2018, he underwent total arch replacement, frozen elephant trunk, and head vessel reconstruction with Dr.Takayama and Dr. Allena Katz.  Course was complicated by sternal wound infection, with cultures growing Klebsiella.  He completed a 14-day course of cefadroxil.  He was following closely with cardiology and nephrology in [place] for blood pressure control.  Moved to [place] in July to help with a family issue.   He was in the ED on 06/19/2019 with chest tightness and shortness of breath.  Troponins negative x2.  CTA chest showed patent graft of the ascending aortic component, patent graft of the proximal descending aorta with, thrombosis of the proximal descending thoracic aortic dissection false lumen along the stent graft, patent type B aortic dissection of the mid descending thoracic aorta extending to the abdominal aortic bifurcation.  Reports that he feels chest pain has been related to muscle cramps from low potassium.  Now taking potassium supplements.  States that he also has chest pain across the  incision in the right upper chest when he does stretches.  TTE on 10/02/2019 showed EF 60-65%, severe LVH, normal RV function, no significant valvular disease.  At clinic visit on 01/05/2020, he was found to be in atrial fibrillation.  Underwent successful DCCV on 02/11/2020.  Since his last clinic visit, he reports that he has been doing okay.  Main complaint has been feeling fatigued.  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  Reports intermittent edema.   Denies any chest pain or dyspnea.  Reports BP has been up to 150s when he checks it at home but did not bring log in today.  Has been taking Eliquis, denies any bleeding issues.   Past Medical History:  Diagnosis Date  . AAA (abdominal aortic aneurysm) (HCC)   . History of open heart surgery   . Hypertension   . Seroma due to trauma [facility])     Past Surgical History:  Procedure Laterality Date  . ABDOMINAL AORTIC ANEURYSM REPAIR    . CARDIOVERSION N/A 02/11/2020   Procedure: CARDIOVERSION;  Surgeon: Little Ishikawa, MD;  Location: [facility] ENDOSCOPY;  Service: Cardiovascular;  Laterality: N/A;  . CORONARY ARTERY BYPASS GRAFT      Current Medications: Current Meds  Medication Sig  . ELIQUIS 5 MG TABS tablet Take 1 tablet (5 mg total) by mouth 2 (two) times daily.  Marland Kitchen labetalol (NORMODYNE) 200 MG tablet Take 2 tablets (400 mg total) by mouth 2 (two) times daily. (Patient taking differently: Take 200 mg  by mouth 2 (two) times daily. )  . Multiple Vitamins-Minerals (MULTIVITAMIN WITH MINERALS) tablet Take 1 tablet by mouth daily. Men  . NIFEdipine (ADALAT CC) 90 MG 24 hr tablet Take 1 tablet (90 mg total) by mouth daily.  . potassium chloride SA (KLOR-CON) 20 MEQ tablet Take 20 mEq by mouth daily as needed (Chest pain).   Marland Kitchen. spironolactone (ALDACTONE) 25 MG tablet Take 0.5 tablets (12.5 mg total) by mouth daily.  . valsartan (DIOVAN) 80 MG tablet Take 1 tablet (80 mg total) by mouth daily.      Allergies:   Patient has no known allergies.   Social History   Socioeconomic History  . Marital status: Single    Spouse name: Not on file  . Number of children: Not on file  . Years of education: Not on file  . Highest education level: Not on file  Occupational History  . Not on file  Tobacco Use  . Smoking status: Current Every Day Smoker    Packs/day: 0.50  . Smokeless tobacco: Never Used  Vaping Use  . Vaping Use: Never used  Substance and Sexual Activity  . Alcohol use: Never  . Drug use: Never  . Sexual activity: Not on file  Other Topics Concern  . Not on file  Social History Narrative  . Not on file   Social Determinants of Health   Financial Resource Strain:   . Difficulty of Paying Living Expenses: Not on file  Food Insecurity:   . Worried About Programme researcher, broadcasting/film/videounning Out of Food in the Last Year: Not on file  . Ran Out of Food in the Last Year: Not on file  Transportation Needs:   . Lack of Transportation (Medical): Not on file  . Lack of Transportation (Non-Medical): Not on file  Physical Activity:   . Days of Exercise per Week: Not on file  . Minutes of Exercise per Session: Not on file  Stress:   . Feeling of Stress : Not on file  Social Connections:   . Frequency of Communication with Friends and Family: Not on file  . Frequency of Social Gatherings with Friends and Family: Not on file  . Attends Religious Services: Not on file  . Active Member of Clubs or Organizations: Not on file  . Attends BankerClub or Organization Meetings: Not on file  . Marital Status: Not on file     Family History: Mother died of MI at age 63.  Father had stroke at age 63  ROS:   Please see the history of present illness.     All other systems reviewed and are negative.  EKGs/Labs/Other Studies Reviewed:    The following studies were reviewed today:   EKG:  EKG is  ordered today.  The ekg ordered today demonstrates atrial fibrillation, rate 74, incomplete right bundle branch  block  TTE 10/02/19: 1. Left ventricular ejection fraction, by visual estimation, is 60 to  65%. The left ventricle has normal function. Left ventricular septal wall  thickness was severely increased. Severely increased left ventricular  posterior wall thickness. There is  severely increased left ventricular hypertrophy.  2. Left ventricular diastolic function could not be evaluated.  3. The left ventricle has no regional wall motion abnormalities.  4. Global right ventricle has normal systolic function.The right  ventricular size is normal. No increase in right ventricular wall  thickness.  5. Left atrial size was moderately dilated.  6. Right atrial size was normal.  7. The mitral valve is normal  in structure. Trivial mitral valve  regurgitation. No evidence of mitral stenosis.  8. The tricuspid valve is normal in structure.  9. The tricuspid valve is normal in structure. Tricuspid valve  regurgitation is not demonstrated.  10. The aortic valve is tricuspid. Aortic valve regurgitation is not  visualized. No evidence of aortic valve sclerosis or stenosis.  11. The pulmonic valve was normal in structure. Pulmonic valve  regurgitation is not visualized.  12. The inferior vena cava is normal in size with greater than 50%  respiratory variability, suggesting right atrial pressure of 3 mmHg.   Recent Labs: 06/19/2019: B Natriuretic Peptide 93.0; Magnesium 2.0 09/08/2019: ALT 8; NT-Pro BNP 719 02/04/2020: BUN 16; Creatinine, Ser 1.27; Hemoglobin 11.7; Platelets 229; Potassium 4.0; Sodium 146  Recent Lipid Panel No results found for: CHOL, TRIG, HDL, CHOLHDL, VLDL, LDLCALC, LDLDIRECT  Physical Exam:    VS:  BP 134/75   Pulse 74   Ht 5\' 11"  (1.803 m)   Wt 244 lb 12.8 oz (111 kg)   SpO2 98%   BMI 34.14 kg/m     Wt Readings from Last 3 Encounters:  04/29/20 244 lb 12.8 oz (111 kg)  04/05/20 247 lb 14.4 oz (112.4 kg)  02/11/20 242 lb 9.6 oz (110 kg)     GEN: Well  nourished, well developed in no acute distress HEENT: Normal NECK: No JVD CARDIAC: irregular, 2/6 systolic murmur RESPIRATORY:  Clear to auscultation without rales, wheezing or rhonchi  ABDOMEN: Soft, non-tender, non-distended MUSCULOSKELETAL:  1+ edema SKIN: Warm and dry NEUROLOGIC:  Alert and oriented x 3 PSYCHIATRIC:  Normal affect   ASSESSMENT:    1. PAF (paroxysmal atrial fibrillation) (HCC)   2. Dissection of aorta, unspecified portion of aorta (HCC)   3. Essential hypertension   4. Snoring   5. 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  No indication for further surgical intervention, planning repeat CT in 1 year -Goal SBP less than 120, heart rate less than 60.    AF/AFL: At clinic visit on 01/05/2020, he was found to be in atrial fibrillation.  Underwent successful DCCV on 02/11/2020, but now back in AF.  Rates appear well controlled.  CHADS-VASc score 2 given HTN, vascular disease. TTE 10/02/2019 shows normal LVEF -Continue Eliquis 5 mg twice daily -Continue labetalol 400 mg twice daily -Refer to the atrial fibrillation clinic  Hypertension: Currently on nifedipine 90 mg daily, spironolactone 12.5 mg daily, valsartan 80 mg daily, and labetalol 400 mg twice daily.   -BP above goal, will check BMP and if stable renal function/creatinine likely increase spironolactone. -Referred to hypertension clinic for evaluation -Given resistant hypertension and also issues with persistent hypokalemia, concern for hyperaldosteronism.  Renin/aldosterone ratio >60, have referred to endocrinology for  evaluation of hyperaldosteronism -Suspect untreated OSA also contributing to resistant hypertension, sleep study ordered  Snoring: Evaluate for OSA with sleep study as above  RTC in 3 months  Medication Adjustments/Labs and Tests Ordered: Current medicines are reviewed at length with the patient today.  Concerns regarding medicines are outlined above.  Orders Placed This Encounter  Procedures  . Basic metabolic panel  .  Amb Referral to AFIB Clinic  . EKG 12-Lead  . Home sleep test   No orders of the defined types were placed in this encounter.   Patient Instructions  Medication Instructions:  Your physician recommends that you continue on your current medications as directed. Please refer to the Current Medication list given to you today.  *If you need a refill on your cardiac medications before your next appointment, please call your pharmacy*   Lab Work: BMET today  If you have labs (blood work) drawn today and your tests are completely normal, you will receive your results only by: Marland Kitchen MyChart Message (if you have MyChart) OR . A paper copy in the mail If you have any lab test that is abnormal or we need to change your treatment, we will call you to review the results.  Follow-Up: At [facility], you and your health needs are our priority.  As part of our continuing mission to provide you with exceptional heart care, we have created designated Provider Care Teams.  These Care Teams include your primary Cardiologist (physician) and Advanced Practice Providers (APPs -  Physician Assistants and Nurse Practitioners) who all work together to provide you with the care you need, when you need it.  We recommend signing up for the patient portal called "MyChart".  Sign up information is provided on this After Visit Summary.  MyChart is used to connect with patients for Virtual Visits (Telemedicine).  Patients are able to view lab/test results, encounter notes, upcoming appointments,  etc.  Non-urgent messages can be sent to your provider as well.   To learn more about what you can do with MyChart, go to [email or web address].    Your next appointment:   3 month(s)  The format for your next appointment:   In Person  Provider:   Epifanio Lesches, MD   Reschedule appointment with pharmacist (HTN clinic)  You have been referred to: Atrial fibrillation clinic at [facility]     Signed, Little Ishikawa, MD  04/29/2020 4:42 PM    [facility]

## 2020-04-29 ENCOUNTER — Other Ambulatory Visit: Payer: Self-pay

## 2020-04-29 ENCOUNTER — Encounter: Payer: Self-pay | Admitting: Cardiology

## 2020-04-29 ENCOUNTER — Ambulatory Visit (INDEPENDENT_AMBULATORY_CARE_PROVIDER_SITE_OTHER): Payer: Medicaid Other | Admitting: Cardiology

## 2020-04-29 VITALS — BP 134/75 | HR 74 | Ht 71.0 in | Wt 244.8 lb

## 2020-04-29 DIAGNOSIS — I480 Paroxysmal atrial fibrillation: Secondary | ICD-10-CM

## 2020-04-29 DIAGNOSIS — R0683 Snoring: Secondary | ICD-10-CM

## 2020-04-29 DIAGNOSIS — E269 Hyperaldosteronism, unspecified: Secondary | ICD-10-CM

## 2020-04-29 DIAGNOSIS — I10 Essential (primary) hypertension: Secondary | ICD-10-CM

## 2020-04-29 DIAGNOSIS — I7100 Dissection of unspecified site of aorta: Secondary | ICD-10-CM

## 2020-04-29 NOTE — Patient Instructions (Addendum)
Medication Instructions:  Your physician recommends that you continue on your current medications as directed. Please refer to the Current Medication list given to you today.  *If you need a refill on your cardiac medications before your next appointment, please call your pharmacy*   Lab Work: BMET today  If you have labs (blood work) drawn today and your tests are completely normal, you will receive your results only by: Marland Kitchen MyChart Message (if you have MyChart) OR . A paper copy in the mail If you have any lab test that is abnormal or we need to change your treatment, we will call you to review the results.  Follow-Up: At [facility], you and your health needs are our priority.  As part of our continuing mission to provide you with exceptional heart care, we have created designated Provider Care Teams.  These Care Teams include your primary Cardiologist (physician) and Advanced Practice Providers (APPs -  Physician Assistants and Nurse Practitioners) who all work together to provide you with the care you need, when you need it.  We recommend signing up for the patient portal called "MyChart".  Sign up information is provided on this After Visit Summary.  MyChart is used to connect with patients for Virtual Visits (Telemedicine).  Patients are able to view lab/test results, encounter notes, upcoming appointments, etc.  Non-urgent messages can be sent to your provider as well.   To learn more about what you can do with MyChart, go to [email or web address].    Your next appointment:   3 month(s)  The format for your next appointment:   In Person  Provider:   Epifanio Lesches, MD   Reschedule appointment with pharmacist (HTN clinic)  You have been referred to: Atrial fibrillation clinic at [facility]

## 2020-04-30 LAB — BASIC METABOLIC PANEL
BUN/Creatinine Ratio: 12 (ref 10–24)
BUN: 15 mg/dL (ref 8–27)
CO2: 29 mmol/L (ref 20–29)
Calcium: 9.2 mg/dL (ref 8.6–10.2)
Chloride: 107 mmol/L — ABNORMAL HIGH (ref 96–106)
Creatinine, Ser: 1.25 mg/dL (ref 0.76–1.27)
GFR calc Af Amer: 71 mL/min/{1.73_m2} (ref >59)
GFR calc non Af Amer: 61 mL/min/{1.73_m2} (ref >59)
Glucose: 90 mg/dL (ref 65–99)
Potassium: 4 mmol/L (ref 3.5–5.2)
Sodium: 146 mmol/L — ABNORMAL HIGH (ref 134–144)

## 2020-05-03 ENCOUNTER — Telehealth: Payer: Self-pay | Admitting: *Deleted

## 2020-05-03 NOTE — Telephone Encounter (Signed)
Patient notified of appointment at [facility] to pick up HST. Patient voiced understanding that he is to go at 7:30 P.M.

## 2020-05-05 ENCOUNTER — Ambulatory Visit: Payer: Medicaid Other | Attending: Cardiology | Admitting: Cardiovascular Disease

## 2020-05-05 ENCOUNTER — Other Ambulatory Visit: Payer: Self-pay

## 2020-05-05 DIAGNOSIS — G4733 Obstructive sleep apnea (adult) (pediatric): Secondary | ICD-10-CM | POA: Diagnosis not present

## 2020-05-05 DIAGNOSIS — I10 Essential (primary) hypertension: Secondary | ICD-10-CM | POA: Insufficient documentation

## 2020-05-05 DIAGNOSIS — I7100 Dissection of unspecified site of aorta: Secondary | ICD-10-CM | POA: Insufficient documentation

## 2020-05-05 DIAGNOSIS — G4736 Sleep related hypoventilation in conditions classified elsewhere: Secondary | ICD-10-CM | POA: Insufficient documentation

## 2020-05-05 DIAGNOSIS — R0683 Snoring: Secondary | ICD-10-CM | POA: Diagnosis not present

## 2020-05-05 DIAGNOSIS — I480 Paroxysmal atrial fibrillation: Secondary | ICD-10-CM | POA: Diagnosis present

## 2020-05-05 DIAGNOSIS — Z79899 Other long term (current) drug therapy: Secondary | ICD-10-CM

## 2020-05-05 MED ORDER — SPIRONOLACTONE 25 MG PO TABS: 25.0000 mg | ORAL_TABLET | Freq: Every day | ORAL | 3 refills | Status: DC

## 2020-05-09 ENCOUNTER — Ambulatory Visit (HOSPITAL_COMMUNITY): Payer: Medicaid Other | Admitting: Physician Assistant

## 2020-05-11 ENCOUNTER — Telehealth: Payer: Self-pay

## 2020-05-11 ENCOUNTER — Other Ambulatory Visit: Payer: Self-pay | Admitting: *Deleted

## 2020-05-11 DIAGNOSIS — E269 Hyperaldosteronism, unspecified: Secondary | ICD-10-CM

## 2020-05-11 NOTE — Telephone Encounter (Signed)
Dr Fransico Him We received a referral for Hyperaldosteronism on this pt. Do you want me to schedule and if so, are the labs in there what you would need?

## 2020-05-12 NOTE — Telephone Encounter (Signed)
We can see the pt and will decide what to order.

## 2020-05-17 ENCOUNTER — Encounter: Payer: Self-pay | Admitting: Cardiovascular Disease

## 2020-05-17 NOTE — Procedures (Signed)
   [place] HOME SLEEP STUDY    Patient Name: Connor [name], Connor [name] Date: 05/05/2020 Gender: Male D.O.B: 02/20/1956 Age (years): 63 Referring Provider: Epifanio Lesches Height (inches): 71 Interpreting Physician: Nicki Guadalajara MD, ABSM Weight (lbs): 249 RPSGT: Peak, Demaryius BMI: 35 MRN: [id number] Neck Size: <br>  CLINICAL INFORMATION Sleep Study Type: HST  Indication for sleep study: snoring, resistat hypertension, PAF  Epworth Sleepiness Score: 9  SLEEP STUDY TECHNIQUE A multi-channel overnight portable sleep study was performed. The channels recorded were: nasal airflow, thoracic respiratory movement, and oxygen saturation with a pulse oximetry. Snoring was also monitored.  MEDICATIONS ELIQUIS 5 MG TABS tablet labetalol (NORMODYNE) 200 MG tablet Multiple Vitamins-Minerals (MULTIVITAMIN WITH MINERALS) tablet NIFEdipine (ADALAT CC) 90 MG 24 hr tablet potassium chloride SA (KLOR-CON) 20 MEQ tablet spironolactone (ALDACTONE) 25 MG tablet valsartan (DIOVAN) 80 MG tablet  Patient self administered medications include: N/A.  SLEEP ARCHITECTURE Patient was studied for 273 minutes. The sleep efficiency was 47.9 % and the patient was supine for 55.2%. The arousal index was 0.0 per hour.  RESPIRATORY PARAMETERS The overall AHI was 23.5 per hour, with a central apnea index of 2.2 per hour.  The oxygen nadir was 76% during sleep.Time spent < 89% was 19.4 minutes  CARDIAC DATA Mean heart rate during sleep was 78.2 bpm.  IMPRESSIONS - Moderate obstructive sleep apnea occurred during this study (AHI 23.5/h). The severity during REM sleep cannot be assessed on this home study, - No significant central sleep apnea occurred during this study (CAI = 2.2/h). - Severe oxygen desaturation was noted during this study (Min O2 = 76%). - Patient snored 39.7% during the sleep.  DIAGNOSIS - Obstructive Sleep Apnea (G47.33) - Nocturnal Hypoxemia (G47.36)  RECOMMENDATIONS -  In this patient with significant cardiovascular co-morbidities with resistant hypertension, PAF, significant oxygen desaturation recommend an in-lab CPAP titration study for optimal evaluation of his sleep disordered breathing particularly during REM sleep. - Effort should be made to optimize nasala nd oropharyngeal patency. - Avoid alcohol, sedatives and other CNS depressants that may worsen sleep apnea and disrupt normal sleep architecture. - Sleep hygiene should be reviewed to assess factors that may improve sleep quality. - Weight management and regular exercise should be initiated or continued. - Recommend a download after CPAP initiatrion and sleep clinic evaluation after 4 weeks of therapy.   [Electronically signed] 05/17/2020 04:49 PM  Nicki Guadalajara MD, [facility], ABSM Diplomate, American Board of Sleep Medicine   NPI: [id number] [facility] PH: [phone]   FX: [phone] ACCREDITED BY THE AMERICAN ACADEMY OF SLEEP MEDICINE

## 2020-05-20 ENCOUNTER — Other Ambulatory Visit: Payer: Self-pay

## 2020-05-20 ENCOUNTER — Ambulatory Visit: Payer: Medicaid Other | Admitting: Pharmacist

## 2020-05-20 DIAGNOSIS — I10 Essential (primary) hypertension: Secondary | ICD-10-CM | POA: Insufficient documentation

## 2020-05-20 DIAGNOSIS — Z79899 Other long term (current) drug therapy: Secondary | ICD-10-CM

## 2020-05-20 NOTE — Patient Instructions (Addendum)
Return for a  follow up appointment in 4 weeks  Check your blood pressure at home daily (if able) and keep record of the readings.  Take your BP meds as follows: *Decrease labetalol for 200mg  in AM and 400mg  in PM for 1 week, then stop taking labetalol in the morning and continue evening dose ONLY  *Take Eliquis 5mg  twice daily (10-12 hours apart)  Bring all of your meds, your BP cuff and your record of home blood pressures to your next appointment.  Exercise as you're able, try to walk approximately 30 minutes per day.  Keep salt intake to a minimum, especially watch canned and prepared boxed foods.  Eat more fresh fruits and vegetables and fewer canned items.  Avoid eating in fast food restaurants.    HOW TO TAKE YOUR BLOOD PRESSURE: . Rest 5 minutes before taking your blood pressure. .  Don't smoke or drink caffeinated beverages for at least 30 minutes before. . Take your blood pressure before (not after) you eat. . Sit comfortably with your back supported and both feet on the floor (don't cross your legs). . Elevate your arm to heart level on a table or a desk. . Use the proper sized cuff. It should fit smoothly and snugly around your bare upper arm. There should be enough room to slip a fingertip under the cuff. The bottom edge of the cuff should be 1 inch above the crease of the elbow. . Ideally, take 3 measurements at one sitting and record the average.

## 2020-05-20 NOTE — Progress Notes (Signed)
Patient ID: Connor [name]                 DOB: 1956/05/24                      MRN: [id number]     HPI: Connor [name] is a 64 y.o. male referred by Dr. Bjorn [name] to HTN clinic. PMH includes atrial fibrillation, AAA, hypertension, and hx of DVT. He reports blurry vision when BP goes down to 120s, but understands the need to keep BP under 130/80 for cardiovascular prevention. Reports compliance with all medication, and denies any other problems with current therapy. Sleep study results pending.  Current HTN meds:  Valsartan 80mg  in AM Spironolactone 25mg  daily Labetalol 400mg  twice daily. Nifedipine 24hr 90 mg every evening  Previously tried:  HCTZ - lack therapeutic response  BP goal: <130/80  Family History: Mother died of MI at age 75.  Father had stroke at age 70  Social History: trying to quit smoking  Diet: lots of fish and chicken, eat out a lot d/t work schedule  Exercise: heavy lifting at work, no additional exercise  Home BP readings: none provided  Wt Readings from Last 3 Encounters:  05/20/20 245 lb (111.1 kg)  04/29/20 244 lb 12.8 oz (111 kg)  04/05/20 247 lb 14.4 oz (112.4 kg)   BP Readings from Last 3 Encounters:  05/20/20 118/64  04/29/20 134/75  04/05/20 (!) 168/94   Pulse Readings from Last 3 Encounters:  05/20/20 76  04/29/20 74  04/05/20 85    Renal function: Estimated Creatinine Clearance: 70.3 mL/min (A) (by C-G formula based on SCr of 1.38 mg/dL (H)).  Past Medical History:  Diagnosis Date  . AAA (abdominal aortic aneurysm) (HCC)   . History of open heart surgery   . Hypertension   . Seroma due to trauma [facility])     Current Outpatient Medications on File Prior to Visit  Medication Sig Dispense Refill  . ELIQUIS 5 MG TABS tablet Take 1 tablet (5 mg total) by mouth 2 (two) times daily. 180 tablet 3  . labetalol (NORMODYNE) 200 MG tablet Take 2 tablets (400 mg total) by mouth 2 (two) times daily. (Patient taking differently: Take 200 mg by mouth  2 (two) times daily. ) 360 tablet 3  . Multiple Vitamins-Minerals (MULTIVITAMIN WITH MINERALS) tablet Take 1 tablet by mouth daily. Men    . NIFEdipine (ADALAT CC) 90 MG 24 hr tablet Take 1 tablet (90 mg total) by mouth daily. 90 tablet 2  . spironolactone (ALDACTONE) 25 MG tablet Take 1 tablet (25 mg total) by mouth daily. 90 tablet 3  . valsartan (DIOVAN) 80 MG tablet Take 1 tablet (80 mg total) by mouth daily. 90 tablet 3   No current facility-administered medications on file prior to visit.    No Known Allergies  Blood pressure 118/64, pulse 76, weight 245 lb (111.1 kg), SpO2 96 %.  Hypertension Blood pressure is well controlled during OV, but patient reports dizziness in the middle of his work day. Patient reports lighheadedness and dizziness when BP goes under 120s at home. He is not monitoring BP regularly and did not provide records of readings today.   Will stop his daytime labetalol, and continue all other medication as prescribed. He is to repeat BMET today as as ordered by Dr 06/29/20, and decrease take. out meals as much as possible. Should monitor BP daily or at least 2-3 times per week. Plan to follow up in  4 weeks and adjust therapy as needed.   Connor [name] PharmD, BCPS, CPP [facility] [address] [place] [address] 05/30/2020 3:47 PM

## 2020-05-21 LAB — BASIC METABOLIC PANEL
BUN/Creatinine Ratio: 8 — ABNORMAL LOW (ref 10–24)
BUN: 11 mg/dL (ref 8–27)
CO2: 27 mmol/L (ref 20–29)
Calcium: 9 mg/dL (ref 8.6–10.2)
Chloride: 103 mmol/L (ref 96–106)
Creatinine, Ser: 1.38 mg/dL — ABNORMAL HIGH (ref 0.76–1.27)
GFR calc Af Amer: 63 mL/min/{1.73_m2} (ref >59)
GFR calc non Af Amer: 54 mL/min/{1.73_m2} — ABNORMAL LOW (ref >59)
Glucose: 84 mg/dL (ref 65–99)
Potassium: 3.9 mmol/L (ref 3.5–5.2)
Sodium: 143 mmol/L (ref 134–144)

## 2020-05-23 ENCOUNTER — Telehealth: Payer: Self-pay | Admitting: *Deleted

## 2020-05-23 ENCOUNTER — Other Ambulatory Visit: Payer: Self-pay | Admitting: Cardiovascular Disease

## 2020-05-23 DIAGNOSIS — G4733 Obstructive sleep apnea (adult) (pediatric): Secondary | ICD-10-CM

## 2020-05-23 DIAGNOSIS — IMO0002 Reserved for concepts with insufficient information to code with codable children: Secondary | ICD-10-CM

## 2020-05-23 NOTE — Telephone Encounter (Signed)
Patient notified of sleep study results and recommendations. He agrees to proceed with CPAP titration scheduled for 06/25/20.

## 2020-05-30 ENCOUNTER — Encounter: Payer: Self-pay | Admitting: Pharmacist

## 2020-05-30 NOTE — Assessment & Plan Note (Addendum)
Blood pressure is well controlled during OV, but patient reports dizziness in the middle of his work day. Patient reports lighheadedness and dizziness when BP goes under 120s at home. He is not monitoring BP regularly and did not provide records of readings today.   Will stop his daytime labetalol, and continue all other medication as prescribed. He is to repeat BMET today as as ordered by Dr Bjorn Pippin, and decrease take. out meals as much as possible. Should monitor BP daily or at least 2-3 times per week. Plan to follow up in 4 weeks and adjust therapy as needed.

## 2020-06-02 ENCOUNTER — Ambulatory Visit: Payer: Medicaid Other | Admitting: "Endocrinology

## 2020-06-10 ENCOUNTER — Other Ambulatory Visit: Payer: Self-pay | Admitting: Cardiology

## 2020-06-17 ENCOUNTER — Other Ambulatory Visit: Payer: Self-pay | Admitting: Cardiology

## 2020-06-25 ENCOUNTER — Encounter (HOSPITAL_BASED_OUTPATIENT_CLINIC_OR_DEPARTMENT_OTHER): Payer: Medicaid Other | Admitting: Cardiovascular Disease

## 2020-07-04 ENCOUNTER — Ambulatory Visit: Payer: Medicaid Other

## 2020-07-04 NOTE — Progress Notes (Deleted)
Patient ID: Connor [name]                 DOB: 1955-11-02                      MRN: [id number]     HPI: Connor [name] is a 64 y.o. male referred by Dr. Bjorn Pippin to HTN clinic. PMH includes atrial fibrillation, AAA, hypertension, and hx of DVT. He reports blurry vision when BP goes down to 120s, but understands the need to keep BP under 130/80 for cardiovascular prevention. Reports compliance with all medication, and denies any other problems with current therapy. Sleep study revealed OSA and CPAP was recommended.   Current HTN meds:  Valsartan 80mg  in AM Spironolactone 25mg  daily Labetalol 400mg  every evening Nifedipine 24hr 90 mg every evening  Previously tried:  HCTZ - lack therapeutic response  BP goal: <130/80  Family History: Mother died of MI at age 44.  Father had stroke at age 32  Social History: trying to quit smoking  Diet: lots of fish and chicken, eat out a lot d/t work schedule  Exercise: heavy lifting at work, no additional exercise  Home BP readings: none provided  Wt Readings from Last 3 Encounters:  05/20/20 245 lb (111.1 kg)  04/29/20 244 lb 12.8 oz (111 kg)  04/05/20 247 lb 14.4 oz (112.4 kg)   BP Readings from Last 3 Encounters:  05/20/20 118/64  04/29/20 134/75  04/05/20 (!) 168/94   Pulse Readings from Last 3 Encounters:  05/20/20 76  04/29/20 74  04/05/20 85    Renal function: CrCl cannot be calculated (Patient's most recent lab result is older than the maximum 21 days allowed.).  Past Medical History:  Diagnosis Date  . AAA (abdominal aortic aneurysm) (HCC)   . History of open heart surgery   . Hypertension   . Seroma due to trauma [facility])     Current Outpatient Medications on File Prior to Visit  Medication Sig Dispense Refill  . ELIQUIS 5 MG TABS tablet Take 1 tablet (5 mg total) by mouth 2 (two) times daily. 180 tablet 3  . labetalol (NORMODYNE) 200 MG tablet Take 2 tablets (400 mg total) by mouth 2 (two) times daily. (Patient taking  differently: Take 200 mg by mouth 2 (two) times daily. ) 360 tablet 3  . Multiple Vitamins-Minerals (MULTIVITAMIN WITH MINERALS) tablet Take 1 tablet by mouth daily. Men    . NIFEdipine (ADALAT CC) 90 MG 24 hr tablet TAKE 1 TABLET(90 MG) BY MOUTH DAILY 90 tablet 2  . spironolactone (ALDACTONE) 25 MG tablet Take 1 tablet (25 mg total) by mouth daily. 90 tablet 3  . valsartan (DIOVAN) 80 MG tablet Take 1 tablet (80 mg total) by mouth daily. 90 tablet 3   No current facility-administered medications on file prior to visit.    No Known Allergies  There were no vitals taken for this visit.  No problem-specific Assessment & Plan notes found for this encounter.  Eather Chaires Rodriguez-Guzman PharmD, BCPS, CPP [facility] [address] [place] [facility] 07/04/2020 2:09 PM

## 2020-07-07 ENCOUNTER — Other Ambulatory Visit: Payer: Self-pay | Admitting: Cardiology

## 2020-07-07 NOTE — Telephone Encounter (Signed)
This is Dr. Schumann's pt 

## 2020-07-28 NOTE — Progress Notes (Deleted)
Cardiology Office Note:    Date:  07/28/2020   ID:  Connor [name], DOB Nov 16, 1955, MRN [id number]  PCP:  Patient, Connor Pcp Per  Cardiologist:  Connor [name].  Electrophysiologist:  None   Referring MD: Connor [name]   Connor chief complaint on [name].   History of Present Illness:    Connor [name] is a 64 y.o. male with a hx of aortic dissection who presents for follow-up.  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  He underwent left common and external iliac artery and left renal vein stents on 09/19/2018 in preparation for total aortic arch repair.  Course was complicated by cardiogenic shock (LVEF down to 15%, which has since recovered), AKI requiring CVVH (with subsequent recovery of renal function), extensive left upper extremity DVT.  On 10/03/2018, he underwent total arch replacement, frozen elephant trunk, and head vessel reconstruction with Dr.Takayama and Dr. Allena Katz.  Course was complicated by sternal wound infection, with cultures growing Klebsiella.  He completed a 14-day course of cefadroxil.  He was following closely with cardiology and nephrology in [place] for blood pressure control.  Moved to [place] in July to help with a family issue.   He was in the ED on 06/19/2019 with chest tightness and shortness of breath.  Troponins negative x2.  CTA chest showed patent graft of the ascending aortic component, patent graft of the proximal descending aorta with, thrombosis of the proximal descending thoracic aortic dissection false lumen along the stent graft, patent type B aortic dissection of the mid descending thoracic aorta extending to the abdominal aortic bifurcation.  Reports that he feels chest pain has been related to muscle cramps from low potassium.  Now taking potassium supplements.  States that he also has chest pain across the incision in the right upper chest  when he does stretches.  TTE on 10/02/2019 showed EF 60-65%, severe LVH, normal RV function, Connor significant valvular disease.  At clinic visit on 01/05/2020, he was [name] to be in atrial fibrillation.  Underwent successful DCCV on 02/11/2020.  Since his last clinic visit,   he reports that he has been doing okay.  Main complaint has been feeling fatigued.  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  Reports intermittent edema.   Denies any chest pain or dyspnea.  Reports BP has been up to 150s when he checks it at home but did not bring log in today.  Has been taking Eliquis, denies any bleeding issues.   Past Medical History:  Diagnosis Date  . AAA (abdominal aortic aneurysm) (HCC)   . History of open heart surgery   . Hypertension   . Seroma due to trauma [facility])     Past Surgical History:  Procedure Laterality Date  . ABDOMINAL AORTIC ANEURYSM REPAIR    . CARDIOVERSION N/A 02/11/2020   Procedure: CARDIOVERSION;  Surgeon: Connor Ishikawa, MD;  Location: [facility] ENDOSCOPY;  Service: Cardiovascular;  Laterality: N/A;  . CORONARY ARTERY BYPASS GRAFT      Current Medications: Connor [name] (Appointment) with Connor Ishikawa, MD.     Allergies:   Patient has Connor known allergies.   Social History   Socioeconomic History  . Marital status: Single    Spouse name: Not on [name]  .  Number of children: Not on [name]  . Years of education: Not on [name]  . Highest education level: Not on [name]  Occupational History  . Not on [name]  Tobacco Use  . Smoking status: Current Every Day Smoker    Packs/day: 0.50  . Smokeless tobacco: Never Used  Vaping Use  . Vaping Use: Never used  Substance and Sexual Activity  . Alcohol use: Never  . Drug use: Never  . Sexual activity: Not on [name]  Other Topics Concern  . Not on [name]  Social History Narrative  . Not on [name]   Social  Determinants of Health   Financial Resource Strain:   . Difficulty of Paying Living Expenses: Not on [name]  Food Insecurity:   . Worried About Programme researcher, broadcasting/film/video in the Last Year: Not on [name]  . Ran Out of Food in the Last Year: Not on [name]  Transportation Needs:   . Lack of Transportation (Medical): Not on [name]  . Lack of Transportation (Non-Medical): Not on [name]  Physical Activity:   . Days of Exercise per Week: Not on [name]  . Minutes of Exercise per Session: Not on [name]  Stress:   . Feeling of Stress : Not on [name]  Social Connections:   . Frequency of Communication with Friends and Family: Not on [name]  . Frequency of Social Gatherings with Friends and Family: Not on [name]  . Attends Religious Services: Not on [name]  . Active Member of Clubs or Organizations: Not on [name]  . Attends Banker Meetings: Not on [name]  . Marital Status: Not on [name]     Family History: Mother died of MI at age 58.  Father had stroke at age 40  ROS:   Please see the history of present illness.     All other systems reviewed and are negative.  EKGs/Labs/Other Studies Reviewed:    The following studies were reviewed today:   EKG:  EKG is  ordered today.  The ekg ordered today demonstrates atrial fibrillation, rate 74, incomplete right bundle branch block  TTE 10/02/19: 1. Left ventricular ejection fraction, by visual estimation, is 60 to  65%. The left ventricle has normal function. Left ventricular septal wall  thickness was severely increased. Severely increased left ventricular  posterior wall thickness. There is  severely increased left ventricular hypertrophy.  2. Left ventricular diastolic function could not be evaluated.  3. The left ventricle has Connor regional wall motion abnormalities.  4. Global right ventricle has normal systolic function.The right  ventricular size is normal. Connor increase in right ventricular wall  thickness.  5. Left atrial size was moderately dilated.   6. Right atrial size was normal.  7. The mitral valve is normal in structure. Trivial mitral valve  regurgitation. Connor evidence of mitral stenosis.  8. The tricuspid valve is normal in structure.  9. The tricuspid valve is normal in structure. Tricuspid valve  regurgitation is not demonstrated.  10. The aortic valve is tricuspid. Aortic valve regurgitation is not  visualized. Connor evidence of aortic valve sclerosis or stenosis.  11. The pulmonic valve was normal in structure. Pulmonic valve  regurgitation is not visualized.  12. The inferior vena cava is normal in size with greater than 50%  respiratory variability, suggesting right atrial pressure of 3 mmHg.   Recent Labs: 09/08/2019: ALT 8; NT-Pro BNP 719 02/04/2020: Hemoglobin 11.7; Platelets 229 05/20/2020: BUN 11; Creatinine, Ser 1.38; Potassium 3.9; Sodium 143  Recent Lipid Panel  Connor results [name] for: CHOL, TRIG, HDL, CHOLHDL, VLDL, LDLCALC, LDLDIRECT  Physical Exam:    VS:  There were Connor vitals taken for this visit.    Wt Readings from Last 3 Encounters:  05/20/20 245 lb (111.1 kg)  04/29/20 244 lb 12.8 oz (111 kg)  04/05/20 247 lb 14.4 oz (112.4 kg)     GEN: Well nourished, well developed in Connor acute distress HEENT: Normal NECK: Connor JVD CARDIAC: irregular, 2/6 systolic murmur RESPIRATORY:  Clear to auscultation without rales, wheezing or rhonchi  ABDOMEN: Soft, non-tender, non-distended MUSCULOSKELETAL:  1+ edema SKIN: Warm and dry NEUROLOGIC:  Alert and oriented x 3 PSYCHIATRIC:  Normal affect   ASSESSMENT:    Connor diagnosis [name]. 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  Connor indication for further surgical intervention, planning repeat CT in 1 year -Goal SBP less than 120, heart rate less than 60.    AF/AFL: At clinic visit on 01/05/2020, he was [name] to be in atrial fibrillation.  Underwent successful DCCV on 02/11/2020, but now back in AF.  Rates appear well controlled.  CHADS-VASc score 2 given HTN, vascular disease. TTE 10/02/2019 shows normal LVEF -Continue Eliquis 5 mg twice daily -Continue labetalol 400 mg twice daily -Refer to the atrial fibrillation clinic  Hypertension: Currently on nifedipine 90 mg daily, spironolactone 12.5 mg daily, valsartan 80 mg daily, and labetalol 400 mg daily.  Daytime dose of labetalol was discontinued due to dizziness while at work during the day -BP above goal, will check BMP and if stable renal function/creatinine likely increase spironolactone. -Referred to hypertension clinic for evaluation -Given resistant hypertension and also issues with persistent hypokalemia, concern for hyperaldosteronism.  Renin/aldosterone ratio >60, have referred to endocrinology for evaluation of hyperaldosteronism -Suspect untreated OSA also contributing to resistant hypertension, sleep study ordered  Snoring: Evaluate for OSA with sleep study as above  RTC in 3 months  Medication Adjustments/Labs and Tests Ordered: Current medicines are reviewed at length with the patient today.  Concerns regarding medicines are outlined above.  Connor orders of the defined types were placed in this [name].  Connor orders of the defined types were placed in this [name].   There are Connor Patient Instructions on [name] for this visit.   Signed, Connor Ishikawa, MD  07/28/2020 10:54 AM    [facility]

## 2020-07-29 ENCOUNTER — Ambulatory Visit: Payer: Medicaid Other | Admitting: Cardiology

## 2020-08-30 ENCOUNTER — Emergency Department (HOSPITAL_COMMUNITY): Payer: Medicaid Other

## 2020-08-30 ENCOUNTER — Other Ambulatory Visit: Payer: Self-pay

## 2020-08-30 ENCOUNTER — Inpatient Hospital Stay (HOSPITAL_COMMUNITY)
Admission: EM | Admit: 2020-08-30 | Discharge: 2020-09-19 | DRG: 853 | Disposition: A | Discharge status: Rehabilitation Facility | Payer: Medicaid Other | Attending: Internal Medicine | Admitting: Internal Medicine

## 2020-08-30 ENCOUNTER — Encounter (HOSPITAL_COMMUNITY): Payer: Self-pay | Admitting: *Deleted

## 2020-08-30 DIAGNOSIS — I248 Other forms of acute ischemic heart disease: Secondary | ICD-10-CM | POA: Diagnosis present

## 2020-08-30 DIAGNOSIS — I2510 Atherosclerotic heart disease of native coronary artery without angina pectoris: Secondary | ICD-10-CM | POA: Diagnosis present

## 2020-08-30 DIAGNOSIS — A409 Streptococcal sepsis, unspecified: Secondary | ICD-10-CM | POA: Diagnosis not present

## 2020-08-30 DIAGNOSIS — R52 Pain, unspecified: Secondary | ICD-10-CM | POA: Diagnosis not present

## 2020-08-30 DIAGNOSIS — M726 Necrotizing fasciitis: Secondary | ICD-10-CM | POA: Diagnosis present

## 2020-08-30 DIAGNOSIS — R0789 Other chest pain: Secondary | ICD-10-CM | POA: Diagnosis not present

## 2020-08-30 DIAGNOSIS — I130 Hypertensive heart and chronic kidney disease with heart failure and stage 1 through stage 4 chronic kidney disease, or unspecified chronic kidney disease: Secondary | ICD-10-CM | POA: Diagnosis present

## 2020-08-30 DIAGNOSIS — R197 Diarrhea, unspecified: Secondary | ICD-10-CM | POA: Diagnosis present

## 2020-08-30 DIAGNOSIS — D696 Thrombocytopenia, unspecified: Secondary | ICD-10-CM | POA: Diagnosis present

## 2020-08-30 DIAGNOSIS — I4819 Other persistent atrial fibrillation: Secondary | ICD-10-CM | POA: Diagnosis not present

## 2020-08-30 DIAGNOSIS — Z79899 Other long term (current) drug therapy: Secondary | ICD-10-CM

## 2020-08-30 DIAGNOSIS — R6520 Severe sepsis without septic shock: Secondary | ICD-10-CM | POA: Diagnosis present

## 2020-08-30 DIAGNOSIS — Z6837 Body mass index (BMI) 37.0-37.9, adult: Secondary | ICD-10-CM | POA: Diagnosis not present

## 2020-08-30 DIAGNOSIS — E8809 Other disorders of plasma-protein metabolism, not elsewhere classified: Secondary | ICD-10-CM | POA: Diagnosis present

## 2020-08-30 DIAGNOSIS — N179 Acute kidney failure, unspecified: Secondary | ICD-10-CM | POA: Diagnosis present

## 2020-08-30 DIAGNOSIS — E872 Acidosis, unspecified: Secondary | ICD-10-CM | POA: Diagnosis present

## 2020-08-30 DIAGNOSIS — I5032 Chronic diastolic (congestive) heart failure: Secondary | ICD-10-CM | POA: Diagnosis present

## 2020-08-30 DIAGNOSIS — I7410 Embolism and thrombosis of unspecified parts of aorta: Secondary | ICD-10-CM | POA: Diagnosis present

## 2020-08-30 DIAGNOSIS — B954 Other streptococcus as the cause of diseases classified elsewhere: Secondary | ICD-10-CM | POA: Diagnosis not present

## 2020-08-30 DIAGNOSIS — N1831 Chronic kidney disease, stage 3a: Secondary | ICD-10-CM | POA: Diagnosis not present

## 2020-08-30 DIAGNOSIS — R112 Nausea with vomiting, unspecified: Secondary | ICD-10-CM | POA: Diagnosis present

## 2020-08-30 DIAGNOSIS — I10 Essential (primary) hypertension: Secondary | ICD-10-CM | POA: Diagnosis not present

## 2020-08-30 DIAGNOSIS — Z7901 Long term (current) use of anticoagulants: Secondary | ICD-10-CM

## 2020-08-30 DIAGNOSIS — R17 Unspecified jaundice: Secondary | ICD-10-CM

## 2020-08-30 DIAGNOSIS — D6489 Other specified anemias: Secondary | ICD-10-CM | POA: Diagnosis present

## 2020-08-30 DIAGNOSIS — D72829 Elevated white blood cell count, unspecified: Secondary | ICD-10-CM | POA: Diagnosis not present

## 2020-08-30 DIAGNOSIS — L03116 Cellulitis of left lower limb: Secondary | ICD-10-CM | POA: Diagnosis present

## 2020-08-30 DIAGNOSIS — R739 Hyperglycemia, unspecified: Secondary | ICD-10-CM | POA: Diagnosis not present

## 2020-08-30 DIAGNOSIS — Z20822 Contact with and (suspected) exposure to covid-19: Secondary | ICD-10-CM | POA: Diagnosis present

## 2020-08-30 DIAGNOSIS — G546 Phantom limb syndrome with pain: Secondary | ICD-10-CM | POA: Diagnosis not present

## 2020-08-30 DIAGNOSIS — Z89612 Acquired absence of left leg above knee: Secondary | ICD-10-CM | POA: Diagnosis not present

## 2020-08-30 DIAGNOSIS — D631 Anemia in chronic kidney disease: Secondary | ICD-10-CM | POA: Diagnosis not present

## 2020-08-30 DIAGNOSIS — M60004 Infective myositis, unspecified left leg: Secondary | ICD-10-CM | POA: Diagnosis present

## 2020-08-30 DIAGNOSIS — E876 Hypokalemia: Secondary | ICD-10-CM | POA: Diagnosis present

## 2020-08-30 DIAGNOSIS — R778 Other specified abnormalities of plasma proteins: Secondary | ICD-10-CM

## 2020-08-30 DIAGNOSIS — R079 Chest pain, unspecified: Secondary | ICD-10-CM | POA: Diagnosis not present

## 2020-08-30 DIAGNOSIS — N1832 Chronic kidney disease, stage 3b: Secondary | ICD-10-CM | POA: Diagnosis present

## 2020-08-30 DIAGNOSIS — A408 Other streptococcal sepsis: Secondary | ICD-10-CM | POA: Diagnosis present

## 2020-08-30 DIAGNOSIS — I4821 Permanent atrial fibrillation: Secondary | ICD-10-CM | POA: Diagnosis present

## 2020-08-30 DIAGNOSIS — I480 Paroxysmal atrial fibrillation: Secondary | ICD-10-CM

## 2020-08-30 DIAGNOSIS — B950 Streptococcus, group A, as the cause of diseases classified elsewhere: Secondary | ICD-10-CM | POA: Diagnosis not present

## 2020-08-30 DIAGNOSIS — I714 Abdominal aortic aneurysm, without rupture: Secondary | ICD-10-CM | POA: Diagnosis present

## 2020-08-30 DIAGNOSIS — A419 Sepsis, unspecified organism: Secondary | ICD-10-CM

## 2020-08-30 DIAGNOSIS — E440 Moderate protein-calorie malnutrition: Secondary | ICD-10-CM | POA: Diagnosis present

## 2020-08-30 DIAGNOSIS — M79604 Pain in right leg: Secondary | ICD-10-CM | POA: Diagnosis present

## 2020-08-30 DIAGNOSIS — M6282 Rhabdomyolysis: Secondary | ICD-10-CM | POA: Diagnosis present

## 2020-08-30 DIAGNOSIS — R7989 Other specified abnormal findings of blood chemistry: Secondary | ICD-10-CM | POA: Diagnosis not present

## 2020-08-30 DIAGNOSIS — N183 Chronic kidney disease, stage 3 unspecified: Secondary | ICD-10-CM | POA: Diagnosis not present

## 2020-08-30 DIAGNOSIS — E669 Obesity, unspecified: Secondary | ICD-10-CM | POA: Diagnosis present

## 2020-08-30 DIAGNOSIS — Z6838 Body mass index (BMI) 38.0-38.9, adult: Secondary | ICD-10-CM | POA: Diagnosis not present

## 2020-08-30 DIAGNOSIS — F17210 Nicotine dependence, cigarettes, uncomplicated: Secondary | ICD-10-CM | POA: Diagnosis present

## 2020-08-30 DIAGNOSIS — R7401 Elevation of levels of liver transaminase levels: Secondary | ICD-10-CM

## 2020-08-30 DIAGNOSIS — G8918 Other acute postprocedural pain: Secondary | ICD-10-CM | POA: Diagnosis not present

## 2020-08-30 DIAGNOSIS — D649 Anemia, unspecified: Secondary | ICD-10-CM

## 2020-08-30 DIAGNOSIS — N182 Chronic kidney disease, stage 2 (mild): Secondary | ICD-10-CM | POA: Diagnosis not present

## 2020-08-30 DIAGNOSIS — D62 Acute posthemorrhagic anemia: Secondary | ICD-10-CM | POA: Diagnosis not present

## 2020-08-30 DIAGNOSIS — D75839 Thrombocytosis, unspecified: Secondary | ICD-10-CM | POA: Diagnosis present

## 2020-08-30 DIAGNOSIS — I129 Hypertensive chronic kidney disease with stage 1 through stage 4 chronic kidney disease, or unspecified chronic kidney disease: Secondary | ICD-10-CM | POA: Diagnosis not present

## 2020-08-30 DIAGNOSIS — A46 Erysipelas: Secondary | ICD-10-CM | POA: Diagnosis not present

## 2020-08-30 DIAGNOSIS — N390 Urinary tract infection, site not specified: Secondary | ICD-10-CM | POA: Diagnosis present

## 2020-08-30 DIAGNOSIS — Z4781 Encounter for orthopedic aftercare following surgical amputation: Secondary | ICD-10-CM | POA: Diagnosis not present

## 2020-08-30 DIAGNOSIS — Z823 Family history of stroke: Secondary | ICD-10-CM

## 2020-08-30 DIAGNOSIS — E269 Hyperaldosteronism, unspecified: Secondary | ICD-10-CM | POA: Diagnosis present

## 2020-08-30 DIAGNOSIS — Z8249 Family history of ischemic heart disease and other diseases of the circulatory system: Secondary | ICD-10-CM

## 2020-08-30 DIAGNOSIS — Z951 Presence of aortocoronary bypass graft: Secondary | ICD-10-CM

## 2020-08-30 DIAGNOSIS — Z8679 Personal history of other diseases of the circulatory system: Secondary | ICD-10-CM

## 2020-08-30 DIAGNOSIS — R7881 Bacteremia: Secondary | ICD-10-CM | POA: Diagnosis not present

## 2020-08-30 LAB — RESP PANEL BY RT-PCR (FLU A&B, COVID) ARPGX2
Influenza A by PCR: NEGATIVE
Influenza B by PCR: NEGATIVE
SARS Coronavirus 2 by RT PCR: NEGATIVE

## 2020-08-30 LAB — ACETAMINOPHEN LEVEL: Acetaminophen (Tylenol), Serum: 14 ug/mL (ref 10–30)

## 2020-08-30 LAB — CBC WITH DIFFERENTIAL/PLATELET
Abs Immature Granulocytes: 0.54 10*3/uL — ABNORMAL HIGH (ref 0.00–0.07)
Basophils Absolute: 0 10*3/uL (ref 0.0–0.1)
Basophils Relative: 0 %
Eosinophils Absolute: 0 10*3/uL (ref 0.0–0.5)
Eosinophils Relative: 0 %
HCT: 31.7 % — ABNORMAL LOW (ref 39.0–52.0)
Hemoglobin: 10.5 g/dL — ABNORMAL LOW (ref 13.0–17.0)
Immature Granulocytes: 4 %
Lymphocytes Relative: 6 %
Lymphs Abs: 0.9 10*3/uL (ref 0.7–4.0)
MCH: 30.2 pg (ref 26.0–34.0)
MCHC: 33.1 g/dL (ref 30.0–36.0)
MCV: 91.1 fL (ref 80.0–100.0)
Monocytes Absolute: 0.8 10*3/uL (ref 0.1–1.0)
Monocytes Relative: 5 %
Neutro Abs: 13.3 10*3/uL — ABNORMAL HIGH (ref 1.7–7.7)
Neutrophils Relative %: 85 %
Platelets: 136 10*3/uL — ABNORMAL LOW (ref 150–400)
RBC: 3.48 MIL/uL — ABNORMAL LOW (ref 4.22–5.81)
RDW: 14.8 % (ref 11.5–15.5)
WBC: 15.6 10*3/uL — ABNORMAL HIGH (ref 4.0–10.5)
nRBC: 0 % (ref 0.0–0.2)

## 2020-08-30 LAB — ETHANOL: Alcohol, Ethyl (B): <10 mg/dL (ref <10)

## 2020-08-30 LAB — PROTIME-INR
INR: 1.5 — ABNORMAL HIGH (ref 0.8–1.2)
Prothrombin Time: 17.9 seconds — ABNORMAL HIGH (ref 11.4–15.2)

## 2020-08-30 LAB — COMPREHENSIVE METABOLIC PANEL
ALT: 60 U/L — ABNORMAL HIGH (ref 0–44)
AST: 279 U/L — ABNORMAL HIGH (ref 15–41)
Albumin: 2.8 g/dL — ABNORMAL LOW (ref 3.5–5.0)
Alkaline Phosphatase: 69 U/L (ref 38–126)
Anion gap: 12 (ref 5–15)
BUN: 55 mg/dL — ABNORMAL HIGH (ref 8–23)
CO2: 22 mmol/L (ref 22–32)
Calcium: 7.8 mg/dL — ABNORMAL LOW (ref 8.9–10.3)
Chloride: 103 mmol/L (ref 98–111)
Creatinine, Ser: 4.21 mg/dL — ABNORMAL HIGH (ref 0.61–1.24)
GFR, Estimated: 15 mL/min — ABNORMAL LOW (ref >60)
Glucose, Bld: 162 mg/dL — ABNORMAL HIGH (ref 70–99)
Potassium: 2.8 mmol/L — ABNORMAL LOW (ref 3.5–5.1)
Sodium: 137 mmol/L (ref 135–145)
Total Bilirubin: 3 mg/dL — ABNORMAL HIGH (ref 0.3–1.2)
Total Protein: 7.2 g/dL (ref 6.5–8.1)

## 2020-08-30 LAB — POC SARS CORONAVIRUS 2 AG -  ED: SARS Coronavirus 2 Ag: NEGATIVE

## 2020-08-30 LAB — LACTIC ACID, PLASMA
Lactic Acid, Venous: 2.1 mmol/L (ref 0.5–1.9)
Lactic Acid, Venous: 2.4 mmol/L (ref 0.5–1.9)

## 2020-08-30 LAB — CBG MONITORING, ED: Glucose-Capillary: 163 mg/dL — ABNORMAL HIGH (ref 70–99)

## 2020-08-30 LAB — TROPONIN I (HIGH SENSITIVITY): Troponin I (High Sensitivity): 425 ng/L (ref <18)

## 2020-08-30 LAB — APTT: aPTT: 53 seconds — ABNORMAL HIGH (ref 24–36)

## 2020-08-30 MED ORDER — ACETAMINOPHEN 325 MG PO TABS
650.0000 mg | ORAL_TABLET | Freq: Once | ORAL | Status: AC | PRN
  Administered 2020-08-30: 650 mg via ORAL
  Filled 2020-08-30: qty 2

## 2020-08-30 MED ORDER — ACETAMINOPHEN 325 MG PO TABS
650.0000 mg | ORAL_TABLET | Freq: Once | ORAL | Status: AC | PRN
  Administered 2020-08-31: 650 mg via ORAL
  Filled 2020-08-30: qty 2

## 2020-08-30 MED ORDER — LACTATED RINGERS IV BOLUS (SEPSIS)
500.0000 mL | Freq: Once | INTRAVENOUS | Status: AC
  Administered 2020-08-30: 500 mL via INTRAVENOUS

## 2020-08-30 MED ORDER — ONDANSETRON HCL 4 MG/2ML IJ SOLN: 4.0000 mg | Freq: Four times a day (QID) | INTRAMUSCULAR | Status: DC | PRN

## 2020-08-30 MED ORDER — SODIUM CHLORIDE 0.9 % IV SOLN
2.0000 g | INTRAVENOUS | Status: DC
  Administered 2020-08-30: 2 g via INTRAVENOUS
  Filled 2020-08-30: qty 20

## 2020-08-30 MED ORDER — ENSURE ENLIVE PO LIQD
237.0000 mL | Freq: Two times a day (BID) | ORAL | Status: DC
  Administered 2020-08-31 – 2020-09-19 (×24): 237 mL via ORAL
  Filled 2020-08-30 (×6): qty 237

## 2020-08-30 MED ORDER — VANCOMYCIN HCL 1500 MG/300ML IV SOLN
1500.0000 mg | Freq: Once | INTRAVENOUS | Status: AC
  Administered 2020-08-31: 1500 mg via INTRAVENOUS
  Filled 2020-08-30: qty 300

## 2020-08-30 MED ORDER — MORPHINE SULFATE (PF) 4 MG/ML IV SOLN
4.0000 mg | Freq: Once | INTRAVENOUS | Status: AC
  Administered 2020-08-30: 4 mg via INTRAVENOUS
  Filled 2020-08-30: qty 1

## 2020-08-30 MED ORDER — LABETALOL HCL 200 MG PO TABS: 200.0000 mg | ORAL_TABLET | Freq: Two times a day (BID) | ORAL | Status: DC

## 2020-08-30 MED ORDER — AMIODARONE HCL IN DEXTROSE 360-4.14 MG/200ML-% IV SOLN
60.0000 mg/h | INTRAVENOUS | Status: AC
  Administered 2020-08-31: 60 mg/h via INTRAVENOUS
  Filled 2020-08-30: qty 200

## 2020-08-30 MED ORDER — LACTATED RINGERS IV BOLUS (SEPSIS)
1000.0000 mL | Freq: Once | INTRAVENOUS | Status: AC
  Administered 2020-08-30: 1000 mL via INTRAVENOUS

## 2020-08-30 MED ORDER — LACTATED RINGERS IV SOLN: INTRAVENOUS | Status: DC

## 2020-08-30 MED ORDER — APIXABAN 5 MG PO TABS
5.0000 mg | ORAL_TABLET | Freq: Two times a day (BID) | ORAL | Status: DC
  Administered 2020-08-30 – 2020-08-31 (×2): 5 mg via ORAL
  Filled 2020-08-30 (×2): qty 1

## 2020-08-30 MED ORDER — ONDANSETRON HCL 4 MG/2ML IJ SOLN
4.0000 mg | Freq: Once | INTRAMUSCULAR | Status: AC
  Administered 2020-08-30: 4 mg via INTRAVENOUS
  Filled 2020-08-30: qty 2

## 2020-08-30 MED ORDER — POTASSIUM CHLORIDE 10 MEQ/100ML IV SOLN
10.0000 meq | INTRAVENOUS | Status: AC
  Administered 2020-08-30 – 2020-08-31 (×2): 10 meq via INTRAVENOUS
  Filled 2020-08-30 (×2): qty 100

## 2020-08-30 MED ORDER — AMIODARONE HCL IN DEXTROSE 360-4.14 MG/200ML-% IV SOLN
30.0000 mg/h | INTRAVENOUS | Status: DC
  Administered 2020-08-31 – 2020-09-04 (×10): 30 mg/h via INTRAVENOUS
  Filled 2020-08-30 (×12): qty 200

## 2020-08-30 NOTE — ED Notes (Signed)
Date and time results received: 08/30/20 2045 (use smartphrase ".now" to insert current time)  Test: troponin Critical Value: 425  Name of Provider Notified: Dr Charm Barges  Orders Received? Or Actions Taken?: Actions Taken: no orders received

## 2020-08-30 NOTE — ED Provider Notes (Signed)
[facility] EMERGENCY DEPARTMENT Provider Note   CSN: [id number] Arrival date & time: 08/30/20  1702     History Chief Complaint  Patient presents with  . Fever    Connor [name] is a 65 y.o. male.  He has a history of aortic dissection and A. fib on Eliquis.  He states he had " Food poisoning" with frequent vomiting and diarrhea for the last 5 days.  He is also had swelling and pain in his left leg.  He is found to be febrile in triage although did not know he was febrile.  Denies any cough chest pain abdominal pain.  He is not sure if there was any blood in the diarrhea.  He is not Covid vaccinated.  The history is provided by the patient.  Fever Max temp prior to arrival:  102.7 Temp source:  Oral Severity:  Unable to specify Onset quality:  Unable to specify Timing:  Unable to specify Progression:  Unchanged Chronicity:  New Relieved by:  None tried Worsened by:  Nothing Ineffective treatments:  None tried Associated symptoms: diarrhea, nausea and vomiting   Associated symptoms: no chest pain, no cough, no dysuria, no headaches, no rash, no rhinorrhea and no sore throat        Past Medical History:  Diagnosis Date  . AAA (abdominal aortic aneurysm) (HCC)   . History of open heart surgery   . Hypertension   . Seroma due to trauma [facility])     Patient Active Problem List   Diagnosis Date Noted  . Hypertension 05/20/2020  . Persistent atrial fibrillation [facility])     Past Surgical History:  Procedure Laterality Date  . ABDOMINAL AORTIC ANEURYSM REPAIR    . CARDIOVERSION N/A 02/11/2020   Procedure: CARDIOVERSION;  Surgeon: Little Ishikawa, MD;  Location: [facility] ENDOSCOPY;  Service: Cardiovascular;  Laterality: N/A;  . CORONARY ARTERY BYPASS GRAFT         No family history on file.  Social History   Tobacco Use  . Smoking status: Current Every Day Smoker    Packs/day: 0.50  . Smokeless tobacco: Never Used  Vaping Use  . Vaping Use: Never used  Substance  Use Topics  . Alcohol use: Never  . Drug use: Never    Home Medications Prior to Admission medications   Medication Sig Start Date End Date Taking? Authorizing Provider  ELIQUIS 5 MG TABS tablet Take 1 tablet (5 mg total) by mouth 2 (two) times daily. 08/11/19   Little Ishikawa, MD  labetalol (NORMODYNE) 200 MG tablet Take 2 tablets (400 mg total) by mouth 2 (two) times daily. Patient taking differently: Take 200 mg by mouth 2 (two) times daily.  09/08/19   Little Ishikawa, MD  Multiple Vitamins-Minerals (MULTIVITAMIN WITH MINERALS) tablet Take 1 tablet by mouth daily. Men    [provider]  NIFEdipine (ADALAT CC) 90 MG 24 hr tablet TAKE 1 TABLET(90 MG) BY MOUTH DAILY 06/17/20   Little Ishikawa, MD  spironolactone (ALDACTONE) 25 MG tablet TAKE 1/2 TABLET(12.5 MG) BY MOUTH DAILY 07/07/20   Little Ishikawa, MD  valsartan (DIOVAN) 80 MG tablet Take 1 tablet (80 mg total) by mouth daily. 11/20/19   Little Ishikawa, MD    Allergies    Patient has no known allergies.  Review of Systems   Review of Systems  Constitutional: Positive for fever.  HENT: Negative for rhinorrhea and sore throat.   Eyes: Negative for visual disturbance.  Respiratory: Negative for  cough and shortness of breath.   Cardiovascular: Positive for leg swelling. Negative for chest pain.  Gastrointestinal: Positive for diarrhea, nausea and vomiting. Negative for abdominal pain.  Genitourinary: Negative for dysuria.  Musculoskeletal: Positive for gait problem.  Skin: Negative for rash.  Neurological: Negative for headaches.    Physical Exam Updated Vital Signs BP 129/77 (BP Location: Right Arm)   Pulse (!) 112   Temp (!) 102.7 F (39.3 C) (Oral)   Resp 18   Ht 5\' 11"  (1.803 m)   Wt 112.9 kg   SpO2 97%   BMI 34.73 kg/m   Physical Exam Vitals and nursing note reviewed.  Constitutional:      Appearance: Normal appearance. He is well-developed and well-nourished.   HENT:     Head: Normocephalic and atraumatic.  Eyes:     Conjunctiva/sclera: Conjunctivae normal.  Cardiovascular:     Rate and Rhythm: Regular rhythm. Tachycardia present.     Heart sounds: No murmur heard.   Pulmonary:     Effort: Pulmonary effort is normal. No respiratory distress.     Breath sounds: Normal breath sounds.  Abdominal:     Palpations: Abdomen is soft.     Tenderness: There is no abdominal tenderness.  Musculoskeletal:        General: Swelling and tenderness present. No edema.     Cervical back: Neck supple.     Comments: He has warmth swelling and tenderness of his left lower extremity.  No open wounds.  Skin:    General: Skin is warm and dry.  Neurological:     General: No focal deficit present.     Mental Status: He is alert.     Sensory: No sensory deficit.     Motor: No weakness.  Psychiatric:        Mood and Affect: Mood and affect normal.     ED Results / Procedures / Treatments   Labs (all labs ordered are listed, but only abnormal results are displayed) Labs Reviewed  LACTIC ACID, PLASMA - Abnormal; Notable for the following components:      Result Value   Lactic Acid, Venous 2.1 (*)    All other components within normal limits  LACTIC ACID, PLASMA - Abnormal; Notable for the following components:   Lactic Acid, Venous 2.4 (*)    All other components within normal limits  COMPREHENSIVE METABOLIC PANEL - Abnormal; Notable for the following components:   Potassium 2.8 (*)    Glucose, Bld 162 (*)    BUN 55 (*)    Creatinine, Ser 4.21 (*)    Calcium 7.8 (*)    Albumin 2.8 (*)    AST 279 (*)    ALT 60 (*)    Total Bilirubin 3.0 (*)    GFR, Estimated 15 (*)    All other components within normal limits  CBC WITH DIFFERENTIAL/PLATELET - Abnormal; Notable for the following components:   WBC 15.6 (*)    RBC 3.48 (*)    Hemoglobin 10.5 (*)    HCT 31.7 (*)    Platelets 136 (*)    Neutro Abs 13.3 (*)    Abs Immature Granulocytes 0.54 (*)     All other components within normal limits  PROTIME-INR - Abnormal; Notable for the following components:   Prothrombin Time 17.9 (*)    INR 1.5 (*)    All other components within normal limits  APTT - Abnormal; Notable for the following components:   aPTT 53 (*)  All other components within normal limits  URINALYSIS, ROUTINE W REFLEX MICROSCOPIC - Abnormal; Notable for the following components:   Color, Urine AMBER (*)    APPearance CLOUDY (*)    Hgb urine dipstick LARGE (*)    Protein, ur 100 (*)    Bacteria, UA RARE (*)    All other components within normal limits  COMPREHENSIVE METABOLIC PANEL - Abnormal; Notable for the following components:   Sodium 134 (*)    Potassium 3.3 (*)    CO2 20 (*)    Glucose, Bld 143 (*)    BUN 54 (*)    Creatinine, Ser 4.56 (*)    Calcium 7.3 (*)    Albumin 2.6 (*)    AST 274 (*)    ALT 58 (*)    Total Bilirubin 4.1 (*)    GFR, Estimated 14 (*)    All other components within normal limits  CBC - Abnormal; Notable for the following components:   WBC 18.3 (*)    RBC 3.35 (*)    Hemoglobin 10.1 (*)    HCT 31.0 (*)    Platelets 137 (*)    All other components within normal limits  PROTIME-INR - Abnormal; Notable for the following components:   Prothrombin Time 21.7 (*)    INR 2.0 (*)    All other components within normal limits  APTT - Abnormal; Notable for the following components:   aPTT 53 (*)    All other components within normal limits  BILIRUBIN, DIRECT - Abnormal; Notable for the following components:   Bilirubin, Direct 2.7 (*)    All other components within normal limits  CBG MONITORING, ED - Abnormal; Notable for the following components:   Glucose-Capillary 163 (*)    All other components within normal limits  TROPONIN I (HIGH SENSITIVITY) - Abnormal; Notable for the following components:   Troponin I (High Sensitivity) 425 (*)    All other components within normal limits  TROPONIN I (HIGH SENSITIVITY) - Abnormal; Notable  for the following components:   Troponin I (High Sensitivity) 482 (*)    All other components within normal limits  CULTURE, BLOOD (ROUTINE X 2)  CULTURE, BLOOD (ROUTINE X 2)  RESP PANEL BY RT-PCR (FLU A&B, COVID) ARPGX2  URINE CULTURE  GASTROINTESTINAL PANEL BY PCR, STOOL (REPLACES STOOL CULTURE)  C DIFFICILE (CDIFF) QUICK SCRN (NO PCR REFLEX)  HEPATITIS PANEL, ACUTE  ACETAMINOPHEN LEVEL  ETHANOL  HIV ANTIBODY (ROUTINE TESTING W REFLEX)  MAGNESIUM  PHOSPHORUS  LACTIC ACID, PLASMA  LACTIC ACID, PLASMA  POC SARS CORONAVIRUS 2 AG -  ED    EKG EKG Interpretation  Date/Time:  Tuesday August 30 2020 19:13:57 EST Ventricular Rate:  158 PR Interval:    QRS Duration: 92 QT Interval:  298 QTC Calculation: 483 R Axis:   -101 Text Interpretation: Atrial fibrillation with rapid ventricular response Right superior axis deviation Pulmonary disease pattern Incomplete right bundle branch block Right ventricular hypertrophy T wave abnormality, consider lateral ischemia Abnormal ECG atrial fib replacing sinus on prior 6/21 Confirmed by Meridee Score [phone]) on 08/30/2020 7:25:29 PM   Radiology US Venous Img Lower  Left (DVT Study)  Result Date: 08/30/2020 CLINICAL DATA:  Pain and edema EXAM: LEFT LOWER EXTREMITY VENOUS DOPPLER ULTRASOUND TECHNIQUE: Gray-scale sonography with compression, as well as color and duplex ultrasound, were performed to evaluate the deep venous system(s) from the level of the common femoral vein through the popliteal and proximal calf veins. COMPARISON:  None. FINDINGS: VENOUS Normal  compressibility of the common femoral, superficial femoral, and popliteal veins. The calf veins were not well visualized. Visualized portions of profunda femoral vein and great saphenous vein unremarkable. No filling defects to suggest DVT on grayscale or color Doppler imaging. Doppler waveforms show normal direction of venous flow, normal respiratory plasticity and response to augmentation.  Limited views of the contralateral common femoral vein are unremarkable. OTHER There is an abnormally enlarged lymph node in the left inguinal region measuring 3.6 x 1.7 x 3.3 cm (previously measuring approximately 1.8 x 1.9 cm on the patient's CT from March 01, 2020). This lymph node does not appear to demonstrate a normal fatty hilum. Limitations: none IMPRESSION: 1. No DVT, however the calf veins were not well visualized on this study. 2. There is an abnormally enlarged lymph node in the left inguinal region measuring 3.6 x 1.7 x 3.3 cm (previously measuring 1.9 x 1.8 cm on the patient's prior CT). While this may represent a reactive lymph node, a normal fatty hilum is not visualized. As such, underlying malignancy cannot be excluded. Short interval follow-up ultrasound or outpatient percutaneous biopsy is recommended. Electronically Signed   By: Constance Holster M.D.   On: 08/30/2020 18:23    Procedures .Critical Care Performed by: Hayden Rasmussen, MD Authorized by: Hayden Rasmussen, MD   Critical care provider statement:    Critical care time (minutes):  45   Critical care time was exclusive of:  Separately billable procedures and treating other patients   Critical care was necessary to treat or prevent imminent or life-threatening deterioration of the following conditions:  Sepsis   Critical care was time spent personally by me on the following activities:  Discussions with consultants, evaluation of patient's response to treatment, examination of patient, ordering and performing treatments and interventions, ordering and review of laboratory studies, ordering and review of radiographic studies, pulse oximetry, re-evaluation of patient's condition, obtaining history from patient or surrogate, review of old charts and development of treatment plan with patient or surrogate   (including critical care time)  Medications Ordered in ED Medications  lactated ringers infusion ( Intravenous New  Bag/Given 08/31/20 0624)  ondansetron (ZOFRAN) injection 4 mg (has no administration in time range)  feeding supplement (ENSURE ENLIVE / ENSURE PLUS) liquid 237 mL (has no administration in time range)  apixaban (ELIQUIS) tablet 5 mg (5 mg Oral Given 08/30/20 2331)  amiodarone (NEXTERONE PREMIX) 360-4.14 MG/200ML-% (1.8 mg/mL) IV infusion (0 mg/hr Intravenous Stopped 08/31/20 0624)    Followed by  amiodarone (NEXTERONE PREMIX) 360-4.14 MG/200ML-% (1.8 mg/mL) IV infusion (30 mg/hr Intravenous New Bag/Given 08/31/20 0546)  acetaminophen (TYLENOL) tablet 650 mg (has no administration in time range)  ceFEPIme (MAXIPIME) 2 g in sodium chloride 0.9 % 100 mL IVPB (has no administration in time range)  vancomycin (VANCOREADY) IVPB 1500 mg/300 mL (has no administration in time range)  acetaminophen (TYLENOL) tablet 650 mg (650 mg Oral Given 08/30/20 1838)  lactated ringers bolus 1,000 mL (0 mLs Intravenous Stopped 08/30/20 2259)    And  lactated ringers bolus 1,000 mL (0 mLs Intravenous Stopped 08/30/20 2124)    And  lactated ringers bolus 1,000 mL (0 mLs Intravenous Stopped 08/30/20 2330)    And  lactated ringers bolus 500 mL (0 mLs Intravenous Stopped 08/30/20 2330)  ondansetron (ZOFRAN) injection 4 mg (4 mg Intravenous Given 08/30/20 1957)  morphine 4 MG/ML injection 4 mg (4 mg Intravenous Given 08/30/20 1957)  potassium chloride 10 mEq in 100 mL IVPB (0 mEq  Intravenous Stopped 08/31/20 0124)  vancomycin (VANCOREADY) IVPB 1500 mg/300 mL (0 mg Intravenous Stopped 08/31/20 0208)  ceFEPIme (MAXIPIME) 2 g in sodium chloride 0.9 % 100 mL IVPB (0 g Intravenous Stopped 08/31/20 0208)    ED Course  I have reviewed the triage vital signs and the nursing notes.  Pertinent labs & imaging results that were available during my care of the patient were reviewed by me and considered in my medical decision making (see chart for details).  Clinical Course as of 08/30/20 2145  Tue Aug 30, 2020  1933 Chest x-ray interpreted by me as no  acute infiltrates.  Does have postsurgical changes aorta. [MB]  2006 When I first examined the patient his heart rate was probably 110-120.  He was not on the monitor at that time.  They moved him into her room and he is now in rapid A. fib going on 150s.  He denies any chest pain or shortness of breath.  He started on a sepsis fluids and he looks like he is back around 100 again with some P waves. [MB]  2046 Troponin elevated at 425. Not having any chest pain. [MB]  2123 Patient denies any alcohol or IV drugs. [MB]    Clinical Course User Index [MB] Terrilee FilesButler, Harrison Zetina C, MD   MDM Rules/Calculators/A&P                         Theola SequinRobert Wintermute was evaluated in Emergency Department on 08/30/2020 for the symptoms described in the history of present illness. He was evaluated in the context of the global COVID-19 pandemic, which necessitated consideration that the patient might be at risk for infection with the SARS-CoV-2 virus that causes COVID-19. Institutional protocols and algorithms that pertain to the evaluation of patients at risk for COVID-19 are in a state of rapid change based on information released by regulatory bodies including the CDC and federal and state organizations. These policies and algorithms were followed during the patient's care in the ED.  This patient complains of nausea vomiting diarrhea, left leg pain, fever; this involves an extensive number of treatment Options and is a complaint that carries with it a high risk of complications and Morbidity. The differential includes sepsis, Sirs, cellulitis, DVT, PE, infectious enteritis, pneumonia, COVID, rapid A. fib, metabolic derangement  I ordered, reviewed and interpreted labs, which included CBC with elevated white count and stable hemoglobin, platelets mildly low, chemistries with low potassium low bicarb new AKI elevated LFTs consistent with dehydration, possible shock liver.  Added on hepatitis panel.  Lactic acid elevated and will need  to be trended.  Urinalysis with possible sign of infection already covered, troponins elevated and will need to be trended, do not think ischemia at this time but could be related to demand I ordered medication IV antibiotics and IV fluids I ordered imaging studies which included chest x-ray and duplex left lower extremity and I independently    visualized and interpreted imaging which showed no gross infiltrates no DVT Previous records obtained and reviewed in epic, no recent admissions I consulted [facility] hospitalist Dr. Thomes DinningAdefeso and discussed lab and imaging findings  Critical Interventions: Work-up and management of patient's sepsis with aggressive fluids and early antibiotics.  Patient also went into A. fib with RVR which improved with fluids.  After the interventions stated above, I reevaluated the patient and found patient to be hemodynamically improving.  Will need admission to the hospital for further management.  Patient  is agreeable to plan.  CHA2DS2/VAS Stroke Risk Points  Current as of 12 minutes ago     1 >= 2 Points: High Risk  1 - 1.99 Points: Medium Risk  0 Points: Low Risk    Last Change: N/A      Details    This score determines the patient's risk of having a stroke if the  patient has atrial fibrillation.       Points Metrics  0 Has Congestive Heart Failure:  No    Current as of 12 minutes ago  0 Has Vascular Disease:  No    Current as of 12 minutes ago  1 Has Hypertension:  Yes    Current as of 12 minutes ago  0 Age:  89    Current as of 12 minutes ago  0 Has Diabetes:  No    Current as of 12 minutes ago  0 Had Stroke:  No  Had TIA:  No  Had Thromboembolism:  No    Current as of 12 minutes ago  0 Male:  No    Current as of 12 minutes ago           Final Clinical Impression(s) / ED Diagnoses Final diagnoses:  Sepsis with acute renal failure without septic shock, due to unspecified organism, unspecified acute renal failure type (HCC)  Cellulitis of left  leg  AKI (acute kidney injury) (HCC)  Elevated troponin  PAF (paroxysmal atrial fibrillation) (HCC)  Elevated LFTs    Rx / DC Orders ED Discharge Orders    None       Terrilee Files, MD 08/31/20 1002

## 2020-08-30 NOTE — H&P (Signed)
History and Physical  Connor [name] DOB: 1956/08/14 DOA: 08/30/2020  Referring physician: Hayden Rasmussen, MD PCP: Patient, No Pcp Per  Patient coming from: Home  Chief Complaint: Fever  HPI: Connor [name] is a 65 y.o. male with medical history significant for aortic dissection, A. fib on Eliquis hypertension and obesity who presents to the emergency department due to 5-day onset of nausea, vomiting and diarrhea.  Patient states that he had a "food poisoning" after eating meatloaf on Thursday (08/25/2020), he endorsed several episodes of nonbloody, nonbilious vomiting which stopped on Saturday (1/2), diarrhea was several episodes daily and watery in nature with last episode being this morning she complained of fever that started today and he also complained of pain and swelling in his left leg.  Patient lives at home with his cousin, he ambulates with a cane at baseline and he has not had Covid vaccine.  ED Course: In the emergency department, temperature was 102.59F, he was tachycardic.  Work-up in the ED showed leukocytosis, thrombocytopenia, hypokalemia, BUN to creatinine 55/4.21 (baseline creatinine at 1.0-1.3) and hyperglycemia.  Lactic acid 2.1, troponin I-425, T bili 3.0, elevated transaminitis.  Respiratory panel for influenza A, B and SARS coronavirus was negative. Chest x-ray showed no acute intrathoracic process. Left lower extremity venous Doppler ultrasound showed no DVT, abnormally enlarged lymph node in the left inguinal region was noted. IV hydration per sepsis protocol was provided, Tylenol was given due to fever.  Patient was started on IV ceftriaxone due to sepsis secondary to left lower extremity cellulitis.  IV Zofran due to nausea and vomiting was given.  Hospitalist was asked to admit patient for further evaluation and management.  Review of Systems: Constitutional: Positive for chills and fever.  HENT: Negative for ear pain and sore throat.   Eyes: Negative  for pain and visual disturbance.  Respiratory: Negative for cough, chest tightness and shortness of breath.   Cardiovascular: Negative for chest pain and palpitations.  Gastrointestinal: Positive for nausea, vomiting and diarrhea.  Negative for abdominal pain Endocrine: Negative for polyphagia and polyuria.  Genitourinary: Negative for decreased urine volume, dysuria, enuresis Musculoskeletal: Positive for left leg pain and swelling. Skin: Negative for color change and rash.  Allergic/Immunologic: Negative for immunocompromised state.  Neurological: Negative for tremors, syncope, speech difficulty, weakness, light-headedness and headaches.  Hematological: Does not bruise/bleed easily.  All other systems reviewed and are negative   Past Medical History:  Diagnosis Date  . AAA (abdominal aortic aneurysm) ([place])   . History of open heart surgery   . Hypertension   . Seroma due to trauma [facility])    Past Surgical History:  Procedure Laterality Date  . ABDOMINAL AORTIC ANEURYSM REPAIR    . CARDIOVERSION N/A 02/11/2020   Procedure: CARDIOVERSION;  Surgeon: Donato Heinz, MD;  Location: [facility] ENDOSCOPY;  Service: Cardiovascular;  Laterality: N/A;  . CORONARY ARTERY BYPASS GRAFT      Social History:  reports that he has been smoking. He has been smoking about 0.50 packs per day. He has never used smokeless tobacco. He reports that he does not drink alcohol and does not use drugs.   No Known Allergies  No family history on file.   Prior to Admission medications   Medication Sig Start Date End Date Taking? Authorizing Provider  ELIQUIS 5 MG TABS tablet Take 1 tablet (5 mg total) by mouth 2 (two) times daily. 08/11/19   Donato Heinz, MD  labetalol (NORMODYNE) 200 MG tablet Take 2 tablets (400  mg total) by mouth 2 (two) times daily. Patient taking differently: Take 200 mg by mouth 2 (two) times daily.  09/08/19   Donato Heinz, MD  Multiple Vitamins-Minerals  (MULTIVITAMIN WITH MINERALS) tablet Take 1 tablet by mouth daily. Men    [provider]  NIFEdipine (ADALAT CC) 90 MG 24 hr tablet TAKE 1 TABLET(90 MG) BY MOUTH DAILY 06/17/20   Donato Heinz, MD  spironolactone (ALDACTONE) 25 MG tablet TAKE 1/2 TABLET(12.5 MG) BY MOUTH DAILY 07/07/20   Donato Heinz, MD  valsartan (DIOVAN) 80 MG tablet Take 1 tablet (80 mg total) by mouth daily. 11/20/19   Donato Heinz, MD    Physical Exam: BP 131/74   Pulse (!) 101   Temp 99.7 F (37.6 C) (Oral)   Resp (!) 25   Ht 5' 11"  (1.803 m)   Wt 112.9 kg   SpO2 92%   BMI 34.73 kg/m   . General: 65 y.o. year-old male well developed well nourished in no acute distress.  Alert and oriented x3. Marland Kitchen HEENT: NCAT, EOMI . Neck: Supple, trachea medial . Cardiovascular: Tachycardia.  Irregular rate and rhythm with no rubs or gallops.  No thyromegaly or JVD noted.  2/4 pulses in all 4 extremities. Marland Kitchen Respiratory: Clear to auscultation with no wheezes or rales. Good inspiratory effort. . Abdomen: Soft nontender nondistended with normal bowel sounds x4 quadrants. . Muskuloskeletal: Left leg tenderness, swelling, warm to touch.   . Neuro: CN II-XII intact, strength, sensation, reflexes . Skin: No ulcerative lesions noted or rashes . Psychiatry: Judgement and insight appear normal. Mood is appropriate for condition and setting          Labs on Admission:  Basic Metabolic Panel: Recent Labs  Lab 08/30/20 1947  NA 137  K 2.8*  CL 103  CO2 22  GLUCOSE 162*  BUN 55*  CREATININE 4.21*  CALCIUM 7.8*   Liver Function Tests: Recent Labs  Lab 08/30/20 1947  AST 279*  ALT 60*  ALKPHOS 69  BILITOT 3.0*  PROT 7.2  ALBUMIN 2.8*   No results for input(s): LIPASE, AMYLASE in the last 168 hours. No results for input(s): AMMONIA in the last 168 hours. CBC: Recent Labs  Lab 08/30/20 1947  WBC 15.6*  NEUTROABS 13.3*  HGB 10.5*  HCT 31.7*  MCV 91.1  PLT 136*   Cardiac  Enzymes: No results for input(s): CKTOTAL, CKMB, CKMBINDEX, TROPONINI in the last 168 hours.  BNP (last 3 results) No results for input(s): BNP in the last 8760 hours.  ProBNP (last 3 results) Recent Labs    09/08/19 1645  PROBNP 719*    CBG: Recent Labs  Lab 08/30/20 1940  GLUCAP 163*    Radiological Exams on Admission: US Venous Img Lower  Left (DVT Study)  Result Date: 08/30/2020 CLINICAL DATA:  Pain and edema EXAM: LEFT LOWER EXTREMITY VENOUS DOPPLER ULTRASOUND TECHNIQUE: Gray-scale sonography with compression, as well as color and duplex ultrasound, were performed to evaluate the deep venous system(s) from the level of the common femoral vein through the popliteal and proximal calf veins. COMPARISON:  None. FINDINGS: VENOUS Normal compressibility of the common femoral, superficial femoral, and popliteal veins. The calf veins were not well visualized. Visualized portions of profunda femoral vein and great saphenous vein unremarkable. No filling defects to suggest DVT on grayscale or color Doppler imaging. Doppler waveforms show normal direction of venous flow, normal respiratory plasticity and response to augmentation. Limited views of the contralateral common femoral  vein are unremarkable. OTHER There is an abnormally enlarged lymph node in the left inguinal region measuring 3.6 x 1.7 x 3.3 cm (previously measuring approximately 1.8 x 1.9 cm on the patient's CT from March 01, 2020). This lymph node does not appear to demonstrate a normal fatty hilum. Limitations: none IMPRESSION: 1. No DVT, however the calf veins were not well visualized on this study. 2. There is an abnormally enlarged lymph node in the left inguinal region measuring 3.6 x 1.7 x 3.3 cm (previously measuring 1.9 x 1.8 cm on the patient's prior CT). While this may represent a reactive lymph node, a normal fatty hilum is not visualized. As such, underlying malignancy cannot be excluded. Short interval follow-up ultrasound or  outpatient percutaneous biopsy is recommended. Electronically Signed   By: Constance Holster M.D.   On: 08/30/2020 18:23   DG Chest Port 1 View  Result Date: 08/30/2020 CLINICAL DATA:  Fever, recent food poisoning EXAM: PORTABLE CHEST 1 VIEW COMPARISON:  03/01/2020, 06/19/2019 FINDINGS: Single frontal view of the chest demonstrates stable enlarged cardiac silhouette. Stable enlargement of the aortic arch, with underlying stent, compatible with repair of prior thoracic aortic dissection. Progressive decrease in size of the aortic arch consistent with thrombosis of the false lumen. No acute airspace disease, effusion, or pneumothorax. No acute bony abnormalities. IMPRESSION: 1. 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 2. No acute intrathoracic process. Electronically Signed   By: Randa Ngo M.D.   On: 08/30/2020 19:29    EKG: I independently viewed the EKG done and my findings are as followed: A. fib with RVR  Assessment/Plan Present on Admission: . Sepsis ([place]) . Persistent atrial fibrillation ([place]) . Hypertension  Principal Problem:   Sepsis ([place]) Active Problems:   Persistent atrial fibrillation ([facility])   Hypertension   Left leg cellulitis   Lactic acidosis   Elevated troponin   Hypoalbuminemia   Leukocytosis   Thrombocytopenia ([facility])   Hypokalemia   Hyperglycemia   Transaminitis   Total bilirubin, elevated   AKI (acute kidney injury) (H[facility])   Nausea vomiting and diarrhea   Sepsis secondary to left leg cellulitis and/or infectious diarrhea Lactic acidosis in the setting of above Patient was febrile, tachypneic, tachycardic and presents with leukocytosis (met SIRS criteria), source of infection was left lower extremity Lactic acid 2.1> 2.4 Patient was started on IV vancomycin and ceftriaxone, we shall continue with vancomycin and cefepime Continue IV  hydration Continue to trend lactic acid Blood culture pending  Nausea, vomiting and diarrhea Last episode of vomiting was Saturday (1/2) per patient Last bowel movement was this morning per patient IV Zofran 4 mg every 6 hours as needed C. difficile and GI panel pending  Transaminitis and elevated total bilirubin possibly secondary to shock liver AST 279, ALT 60, total bilirubin 3.0 Patient denies use of alcohol and denies abdominal pain Direct bilirubin will be checked Hepatitis panel pending  Hypokalemia K+ 2.8; this will be replenished Continue to monitor potassium levels  Elevated troponin Troponin I- 425 > 482, this is possibly secondary to type II demand ischemia Patient denies chest pain Patient already had home Eliquis Chest x-ray showed A. fib with RVR (patient has history of A. fib with RVR) Continue to trend troponin level Continue telemetry  Hyperglycemia with no known history of type II DM Blood glucose = 162; possibly reactive Consider checking A1c if blood glucose level continues to stay  elevated  Acute kidney injury BUN to creatinine 55/4.21 (baseline creatinine at 1.0-1.3) Continue IV hydration Renally adjust medications, avoid nephrotoxic agents/dehydration/hypotension  Thrombocytopenia Platelets 136; stable; continue to monitor platelet level with morning labs  Hypoalbuminemia probably secondary to moderate protein calorie malnutrition Protein supplement will be provided  Enlarged lymph node LLE venous Doppler US ruled out DVT, but showed an abnormally enlarged lymph node in the left inguinal region measuring 3.6 x 1.7 x 3.3 cm (previously measuring 1.9 x 1.8 cm on the patient's prior CT). Short interval follow-up ultrasound or outpatient percutaneous biopsy is recommended.  Acute on chronic A. fib with RVR Continue Eliquis  Continue IV amiodarone  Essential pretension BP meds will be held at this time due to soft BP  Obesity (BMI  34.73) Patient will be counseled on diet and lifestyle modification when more stable   DVT prophylaxis: Eliquis  Code Status: Full code  Family Communication: None at bedside  Disposition Plan:  Patient is from:                        home Anticipated DC to:                   SNF or family members home Anticipated DC date:               2-3 days Anticipated DC barriers:           Patient is unstable to be discharged at this time due to sepsis secondary to left lower extremity cellulitis  Consults called: None  Admission status: Inpatient    Bernadette Hoit MD [facility]  08/30/2020, 10:30 PM

## 2020-08-30 NOTE — ED Triage Notes (Signed)
States he got sick on the 26th with symptoms of food poisoning, today has fever and is restless

## 2020-08-30 NOTE — ED Notes (Signed)
Date and time results received: 08/30/20 2030  Test: Lactic Acid Critical Value: 2.1  Name of Provider Notified: Dr. Charm Barges  Orders Received? Or Actions Taken?: n/a

## 2020-08-31 ENCOUNTER — Inpatient Hospital Stay (HOSPITAL_COMMUNITY): Payer: Medicaid Other

## 2020-08-31 DIAGNOSIS — L03116 Cellulitis of left lower limb: Secondary | ICD-10-CM

## 2020-08-31 DIAGNOSIS — A409 Streptococcal sepsis, unspecified: Secondary | ICD-10-CM | POA: Diagnosis not present

## 2020-08-31 DIAGNOSIS — R6520 Severe sepsis without septic shock: Secondary | ICD-10-CM

## 2020-08-31 LAB — COMPREHENSIVE METABOLIC PANEL
ALT: 58 U/L — ABNORMAL HIGH (ref 0–44)
AST: 274 U/L — ABNORMAL HIGH (ref 15–41)
Albumin: 2.6 g/dL — ABNORMAL LOW (ref 3.5–5.0)
Alkaline Phosphatase: 59 U/L (ref 38–126)
Anion gap: 12 (ref 5–15)
BUN: 54 mg/dL — ABNORMAL HIGH (ref 8–23)
CO2: 20 mmol/L — ABNORMAL LOW (ref 22–32)
Calcium: 7.3 mg/dL — ABNORMAL LOW (ref 8.9–10.3)
Chloride: 102 mmol/L (ref 98–111)
Creatinine, Ser: 4.56 mg/dL — ABNORMAL HIGH (ref 0.61–1.24)
GFR, Estimated: 14 mL/min — ABNORMAL LOW (ref >60)
Glucose, Bld: 143 mg/dL — ABNORMAL HIGH (ref 70–99)
Potassium: 3.3 mmol/L — ABNORMAL LOW (ref 3.5–5.1)
Sodium: 134 mmol/L — ABNORMAL LOW (ref 135–145)
Total Bilirubin: 4.1 mg/dL — ABNORMAL HIGH (ref 0.3–1.2)
Total Protein: 6.5 g/dL (ref 6.5–8.1)

## 2020-08-31 LAB — URINALYSIS, ROUTINE W REFLEX MICROSCOPIC
Bilirubin Urine: NEGATIVE
Glucose, UA: NEGATIVE mg/dL
Ketones, ur: NEGATIVE mg/dL
Leukocytes,Ua: NEGATIVE
Nitrite: NEGATIVE
Protein, ur: 100 mg/dL — AB
Specific Gravity, Urine: 1.017 (ref 1.005–1.030)
pH: 5 (ref 5.0–8.0)

## 2020-08-31 LAB — CBC
HCT: 31 % — ABNORMAL LOW (ref 39.0–52.0)
Hemoglobin: 10.1 g/dL — ABNORMAL LOW (ref 13.0–17.0)
MCH: 30.1 pg (ref 26.0–34.0)
MCHC: 32.6 g/dL (ref 30.0–36.0)
MCV: 92.5 fL (ref 80.0–100.0)
Platelets: 137 10*3/uL — ABNORMAL LOW (ref 150–400)
RBC: 3.35 MIL/uL — ABNORMAL LOW (ref 4.22–5.81)
RDW: 15 % (ref 11.5–15.5)
WBC: 18.3 10*3/uL — ABNORMAL HIGH (ref 4.0–10.5)
nRBC: 0 % (ref 0.0–0.2)

## 2020-08-31 LAB — MAGNESIUM: Magnesium: 2 mg/dL (ref 1.7–2.4)

## 2020-08-31 LAB — BLOOD CULTURE ID PANEL (REFLEXED) - BCID2

## 2020-08-31 LAB — HEPATITIS PANEL, ACUTE
HCV Ab: NONREACTIVE
Hep A IgM: NONREACTIVE
Hep B C IgM: NONREACTIVE
Hepatitis B Surface Ag: NONREACTIVE

## 2020-08-31 LAB — CREATININE, SERUM
Creatinine, Ser: 5.77 mg/dL — ABNORMAL HIGH (ref 0.61–1.24)
GFR, Estimated: 10 mL/min — ABNORMAL LOW (ref >60)

## 2020-08-31 LAB — LACTIC ACID, PLASMA
Lactic Acid, Venous: 1.5 mmol/L (ref 0.5–1.9)
Lactic Acid, Venous: 1.4 mmol/L (ref 0.5–1.9)

## 2020-08-31 LAB — PROTIME-INR
INR: 2 — ABNORMAL HIGH (ref 0.8–1.2)
Prothrombin Time: 21.7 seconds — ABNORMAL HIGH (ref 11.4–15.2)

## 2020-08-31 LAB — MRSA PCR SCREENING: MRSA by PCR: NEGATIVE

## 2020-08-31 LAB — VANCOMYCIN, RANDOM: Vancomycin Rm: 15

## 2020-08-31 LAB — CK: Total CK: 28083 U/L — ABNORMAL HIGH (ref 49–397)

## 2020-08-31 LAB — PHOSPHORUS: Phosphorus: 3.5 mg/dL (ref 2.5–4.6)

## 2020-08-31 LAB — APTT: aPTT: 53 seconds — ABNORMAL HIGH (ref 24–36)

## 2020-08-31 LAB — HIV ANTIBODY (ROUTINE TESTING W REFLEX): HIV Screen 4th Generation wRfx: NONREACTIVE

## 2020-08-31 LAB — BILIRUBIN, DIRECT: Bilirubin, Direct: 2.7 mg/dL — ABNORMAL HIGH (ref 0.0–0.2)

## 2020-08-31 LAB — TROPONIN I (HIGH SENSITIVITY): Troponin I (High Sensitivity): 482 ng/L (ref <18)

## 2020-08-31 MED ORDER — VANCOMYCIN HCL 1500 MG/300ML IV SOLN: 1500.0000 mg | INTRAVENOUS | Status: DC

## 2020-08-31 MED ORDER — HYDROMORPHONE HCL 1 MG/ML IJ SOLN
0.5000 mg | INTRAMUSCULAR | Status: DC | PRN
  Administered 2020-08-31 – 2020-09-07 (×14): 0.5 mg via INTRAVENOUS
  Filled 2020-08-31: qty 0.5
  Filled 2020-08-31 (×2): qty 1
  Filled 2020-08-31: qty 0.5
  Filled 2020-08-31: qty 1
  Filled 2020-08-31 (×2): qty 0.5
  Filled 2020-08-31: qty 1
  Filled 2020-08-31: qty 0.5
  Filled 2020-08-31: qty 1
  Filled 2020-08-31 (×2): qty 0.5
  Filled 2020-08-31 (×3): qty 1

## 2020-08-31 MED ORDER — SODIUM CHLORIDE 0.9 % IV SOLN
2.0000 g | INTRAVENOUS | Status: DC
  Administered 2020-09-01 – 2020-09-02 (×2): 2 g via INTRAVENOUS
  Filled 2020-08-31 (×2): qty 2

## 2020-08-31 MED ORDER — METRONIDAZOLE IN NACL 5-0.79 MG/ML-% IV SOLN: 500.0000 mg | Freq: Three times a day (TID) | INTRAVENOUS | Status: DC

## 2020-08-31 MED ORDER — VANCOMYCIN VARIABLE DOSE PER UNSTABLE RENAL FUNCTION (PHARMACIST DOSING): Status: DC

## 2020-08-31 MED ORDER — ASPIRIN EC 81 MG PO TBEC: 81.0000 mg | DELAYED_RELEASE_TABLET | Freq: Every day | ORAL | Status: DC

## 2020-08-31 MED ORDER — ASPIRIN EC 81 MG PO TBEC
81.0000 mg | DELAYED_RELEASE_TABLET | Freq: Every day | ORAL | Status: DC
  Administered 2020-09-01 – 2020-09-18 (×17): 81 mg via ORAL
  Filled 2020-08-31 (×18): qty 1

## 2020-08-31 MED ORDER — OXYCODONE HCL 5 MG PO TABS
5.0000 mg | ORAL_TABLET | ORAL | Status: DC | PRN
  Administered 2020-08-31 – 2020-09-08 (×13): 5 mg via ORAL
  Filled 2020-08-31 (×13): qty 1

## 2020-08-31 MED ORDER — METRONIDAZOLE IN NACL 5-0.79 MG/ML-% IV SOLN
500.0000 mg | Freq: Three times a day (TID) | INTRAVENOUS | Status: DC
  Administered 2020-08-31 (×2): 500 mg via INTRAVENOUS
  Filled 2020-08-31 (×2): qty 100

## 2020-08-31 MED ORDER — CLINDAMYCIN PHOSPHATE 900 MG/50ML IV SOLN
900.0000 mg | Freq: Three times a day (TID) | INTRAVENOUS | Status: DC
  Administered 2020-08-31 – 2020-09-01 (×2): 900 mg via INTRAVENOUS
  Filled 2020-08-31 (×2): qty 50

## 2020-08-31 MED ORDER — SODIUM CHLORIDE 0.9 % IV SOLN
2.0000 g | Freq: Once | INTRAVENOUS | Status: AC
  Administered 2020-08-31: 2 g via INTRAVENOUS
  Filled 2020-08-31: qty 2

## 2020-08-31 MED ORDER — CHLORHEXIDINE GLUCONATE CLOTH 2 % EX PADS
6.0000 | MEDICATED_PAD | Freq: Every day | CUTANEOUS | Status: DC
  Administered 2020-09-01 – 2020-09-07 (×8): 6 via TOPICAL

## 2020-08-31 MED ORDER — SODIUM CHLORIDE 0.9 % IV SOLN
2.0000 g | INTRAVENOUS | Status: DC
  Administered 2020-08-31: 2 g via INTRAVENOUS
  Filled 2020-08-31: qty 20

## 2020-08-31 MED ORDER — VANCOMYCIN HCL 1500 MG/300ML IV SOLN
1500.0000 mg | Freq: Once | INTRAVENOUS | Status: AC
  Administered 2020-08-31: 1500 mg via INTRAVENOUS
  Filled 2020-08-31: qty 300

## 2020-08-31 MED ORDER — SODIUM CHLORIDE 0.9 % IV SOLN: 2.0000 g | INTRAVENOUS | Status: DC

## 2020-08-31 NOTE — Progress Notes (Signed)
Pt is not on any oxygen at home. Pt is on 2L acute here, and when it is taken off, he desat's into the low 80's rather quickly. Pt educated to keep oxygen on at all times unless instructed by a healthcare personnel to take it off. Will continue to monitor.

## 2020-08-31 NOTE — Consult Note (Signed)
Reason for Consult: swelling left leg Referring Physician: Dr Katharine Look Connor [name] is an 65 y.o. male.  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  He did have a bout of what he calls food poisoning and he did a lot of activity but he never was lying in bed or lying on his left leg.  As the swelling increased his functional activity decreased and presented with a initial diagnosis of cellulitis and was found to have positive blood cultures and was started on appropriate antibiotics  The swelling was concerning to the medical staff and they wanted him evaluated for compartment syndrome  Past Medical History:  Diagnosis Date  . AAA (abdominal aortic aneurysm) (HCC)   . History of open heart surgery   . Hypertension   . Seroma due to trauma [facility])     Past Surgical History:  Procedure Laterality Date  . ABDOMINAL AORTIC ANEURYSM REPAIR    . CARDIOVERSION N/A 02/11/2020   Procedure: CARDIOVERSION;  Surgeon: Little Ishikawa, MD;  Location: [facility] ENDOSCOPY;  Service: Cardiovascular;  Laterality: N/A;  . CORONARY ARTERY BYPASS GRAFT      No family history on file.  Social History:  reports that he has been smoking. He has been smoking about 0.50 packs per day. He has never used smokeless tobacco. He reports that he does not drink alcohol and does not use drugs.  Allergies: No Known Allergies  Medications: I have reviewed the patient's current medications.  Results for orders placed or performed during the hospital encounter of 08/30/20 (from the past 48 hour(s))  POC SARS Coronavirus 2 Ag-ED -     Status: None   Collection Time: 08/30/20  6:47 PM  Result Value Ref Range   SARS Coronavirus 2 Ag NEGATIVE NEGATIVE    Comment: (NOTE) SARS-CoV-2 antigen NOT DETECTED.   Negative  results are presumptive.  Negative results do not preclude SARS-CoV-2 infection and should not be used as the sole basis for treatment or other patient management decisions, including infection  control decisions, particularly in the presence of clinical signs and  symptoms consistent with COVID-19, or in those who have been in contact with the virus.  Negative results must be combined with clinical observations, patient history, and epidemiological information. The expected result is Negative.  Fact Sheet for Patients: https://sanders-williams.net/  Fact Sheet for Healthcare Providers: https://martinez.com/   This test is not yet approved or cleared by the [place] FDA and  has been authorized for detection and/or diagnosis of SARS-CoV-2 by FDA under an Emergency Use Authorization (EUA).  This EUA will remain in effect (meaning this test can be used) for the duration of  the C OVID-19 declaration under Section 564(b)(1) of the Act, 21 U.S.C. section 360bbb-3(b)(1), unless the authorization is terminated or revoked sooner.    Troponin I (High Sensitivity)     Status: Abnormal   Collection Time: 08/30/20  7:26 PM  Result Value Ref Range   Troponin I (High Sensitivity) 425 (HH) <18 ng/L    Comment: CRITICAL RESULT CALLED TO, READ BACK BY AND VERIFIED WITH: BELTON,C ON 08/30/20 AT 2045 BY LOY.C (NOTE) Elevated high sensitivity troponin I (hsTnI) values and significant  changes across serial measurements may suggest ACS but many other  chronic and acute conditions are known to elevate hsTnI  results.  Refer to the Links section for chest pain algorithms and additional  guidance. Performed at [facility], [address] [place], [place] [address]   Blood Culture (routine x 2)     Status: None (Preliminary result)   Collection Time: 08/30/20  7:34 PM   Specimen: BLOOD  Result Value Ref Range   Specimen Description      BLOOD BLOOD LEFT  ARM Performed at [facility], [address] [place], [place] [address]    Special Requests      BOTTLES DRAWN AEROBIC AND ANAEROBIC Blood Culture adequate volume Performed at [facility], [address] [place], [place] [address]    Culture  Setup Time      GRAM POSITIVE COCCI IN CHAINS ANAEROBIC BOTTLE ONLY Gram Stain Report Called to,Read Back By and Verified With: MICHAEL DOSS @0905  08/31/20 BY JONES,T [facility] CORRECTED RESULTS PREVIOUSLY REPORTED AS: GRAM POSITIVE RODS CORRECTED RESULTS CALLED TO: S HURTH PHARMD @1409  08/31/20 EB Performed at [facility], [address] [place], [place] [address]    Culture GRAM POSITIVE COCCI IN CHAINS    Report Status PENDING   Blood Culture (routine x 2)     Status: None (Preliminary result)   Collection Time: 08/30/20  7:35 PM   Specimen: BLOOD RIGHT ARM  Result Value Ref Range   Specimen Description      BLOOD RIGHT ARM Performed at [facility], [address] [place], [place] [address]    Special Requests      BOTTLES DRAWN AEROBIC AND ANAEROBIC Blood Culture adequate volume Performed at [facility], [address] [place], [place] [address]    Culture  Setup Time      GRAM POSITIVE COCCI IN CHAINS IN BOTH AEROBIC AND ANAEROBIC BOTTLES Gram Stain Report Called to,Read Back By and Verified With: MICHAEL DOSS @0905  08/31/20 BY JONES,T [facility] Organism ID to follow CORRECTED RESULTS PREVIOUSLY REPORTED AS: GRAM POSITIVE RODS CORRECTED RESULTS CALLED TO: S HURTH PHARMD @1409  08/31/20 EB Performed at [facility], [address] [place], [place] [address]    Culture GRAM POSITIVE COCCI IN CHAINS    Report Status PENDING   Resp Panel by RT-PCR (Flu A&B, Covid) Nasopharyngeal Swab     Status: None   Collection Time: 08/30/20  7:35 PM   Specimen: Nasopharyngeal Swab; Nasopharyngeal(NP) swabs in vial transport medium  Result Value Ref Range   SARS Coronavirus 2 by RT PCR NEGATIVE NEGATIVE    Comment: (NOTE) SARS-CoV-2 target  nucleic acids are NOT DETECTED.  The SARS-CoV-2 RNA is generally detectable in upper respiratory specimens during the acute phase of infection. The lowest concentration of SARS-CoV-2 viral copies this assay can detect is 138 copies/mL. A negative result does not preclude SARS-Cov-2 infection and should not be used as the sole basis for treatment or other patient management decisions. A negative result may occur with  improper specimen collection/handling, submission of specimen other than nasopharyngeal swab, presence of viral mutation(s) within the areas targeted by this assay, and inadequate number of viral copies(<138 copies/mL). A negative result must be combined with clinical observations, patient history, and epidemiological information. The expected result is Negative.  Fact Sheet for Patients:  BloggerCourse.comhttps://www.fda.gov/media/152166/download  Fact Sheet for Healthcare Providers:  SeriousBroker.ithttps://www.fda.gov/media/152162/download  This test is no t yet approved or cleared by the [place] FDA and  has been authorized for detection and/or diagnosis of SARS-CoV-2 by FDA under an Emergency Use Authorization (EUA). This EUA will remain  in effect (meaning this test can be used) for the duration of the COVID-19 declaration under Section 564(b)(1) of the Act, 21 U.S.C.section 360bbb-3(b)(1), unless the authorization is terminated  or revoked sooner.       Influenza A by PCR NEGATIVE NEGATIVE   Influenza B by PCR NEGATIVE NEGATIVE    Comment: (NOTE) The Xpert Xpress SARS-CoV-2/FLU/RSV plus assay is intended as an aid in the diagnosis of influenza from Nasopharyngeal swab specimens and should not be used as a sole basis for treatment. Nasal washings and aspirates are unacceptable for Xpert Xpress SARS-CoV-2/FLU/RSV testing.  Fact Sheet for Patients: BloggerCourse.comhttps://www.fda.gov/media/152166/download  Fact Sheet for Healthcare Providers: SeriousBroker.ithttps://www.fda.gov/media/152162/download  This test  is not yet approved or cleared by the [place] FDA and has been authorized for detection and/or diagnosis of SARS-CoV-2 by FDA under an Emergency Use Authorization (EUA). This EUA will remain in effect (meaning this test can be used) for the duration of the COVID-19 declaration under Section 564(b)(1) of the Act, 21 U.S.C. section 360bbb-3(b)(1), unless the authorization is terminated or revoked.  Performed at [facility], [address] [place], [place] [address]   Blood Culture ID Panel (Reflexed)     Status: Abnormal   Collection Time: 08/30/20  7:35 PM  Result Value Ref Range   Enterococcus faecalis NOT DETECTED NOT DETECTED   Enterococcus Faecium NOT DETECTED NOT DETECTED   Listeria monocytogenes NOT DETECTED NOT DETECTED   Staphylococcus species NOT DETECTED NOT DETECTED   Staphylococcus aureus (BCID) NOT DETECTED NOT DETECTED   Staphylococcus epidermidis NOT DETECTED NOT DETECTED   Staphylococcus lugdunensis NOT DETECTED NOT DETECTED   Streptococcus species DETECTED (A) NOT DETECTED    Comment: Not Enterococcus species, Streptococcus agalactiae, Streptococcus pyogenes, or Streptococcus pneumoniae. CRITICAL RESULT CALLED TO, READ BACK BY AND VERIFIED WITH: G. Coffee PharmD 16:30 08/31/20 ([email or web address])    Streptococcus agalactiae NOT DETECTED NOT DETECTED   Streptococcus pneumoniae NOT DETECTED NOT DETECTED   Streptococcus pyogenes NOT DETECTED NOT DETECTED   A.calcoaceticus-baumannii NOT DETECTED NOT DETECTED   Bacteroides fragilis NOT DETECTED NOT DETECTED   Enterobacterales NOT DETECTED NOT DETECTED   Enterobacter cloacae complex NOT DETECTED NOT DETECTED   Escherichia coli NOT DETECTED NOT DETECTED   Klebsiella aerogenes NOT DETECTED NOT DETECTED   Klebsiella oxytoca NOT DETECTED NOT DETECTED   Klebsiella pneumoniae NOT DETECTED NOT DETECTED   Proteus species NOT DETECTED NOT DETECTED   Salmonella species NOT DETECTED NOT DETECTED   Serratia marcescens NOT DETECTED NOT  DETECTED   Haemophilus influenzae NOT DETECTED NOT DETECTED   Neisseria meningitidis NOT DETECTED NOT DETECTED   Pseudomonas aeruginosa NOT DETECTED NOT DETECTED   Stenotrophomonas maltophilia NOT DETECTED NOT DETECTED   Candida albicans NOT DETECTED NOT DETECTED   Candida auris NOT DETECTED NOT DETECTED   Candida glabrata NOT DETECTED NOT DETECTED   Candida krusei NOT DETECTED NOT DETECTED   Candida parapsilosis NOT DETECTED NOT DETECTED   Candida tropicalis NOT DETECTED NOT DETECTED   Cryptococcus neoformans/gattii NOT DETECTED NOT DETECTED    Comment: Performed at [facility], [address] [place], [place] [address]  CBG monitoring, ED     Status: Abnormal   Collection Time: 08/30/20  7:40 PM  Result Value Ref Range   Glucose-Capillary 163 (H) 70 - 99 mg/dL    Comment: Glucose reference range applies only to samples taken after fasting for at least 8 hours.   Comment 1 Notify RN    Comment 2 Document in Chart  Lactic acid, plasma     Status: Abnormal   Collection Time: 08/30/20  7:47 PM  Result Value Ref Range   Lactic Acid, Venous 2.1 (HH) 0.5 - 1.9 mmol/L    Comment: CRITICAL RESULT CALLED TO, READ BACK BY AND VERIFIED WITH: [place],K ON 08/30/20 AT 2030 BY LOY,C Performed at [facility], [address] [place], [place] [address]   Comprehensive metabolic panel     Status: Abnormal   Collection Time: 08/30/20  7:47 PM  Result Value Ref Range   Sodium 137 135 - 145 mmol/L   Potassium 2.8 (L) 3.5 - 5.1 mmol/L   Chloride 103 98 - 111 mmol/L   CO2 22 22 - 32 mmol/L   Glucose, Bld 162 (H) 70 - 99 mg/dL    Comment: Glucose reference range applies only to samples taken after fasting for at least 8 hours.   BUN 55 (H) 8 - 23 mg/dL   Creatinine, Ser 8.24 (H) 0.61 - 1.24 mg/dL   Calcium 7.8 (L) 8.9 - 10.3 mg/dL   Total Protein 7.2 6.5 - 8.1 g/dL   Albumin 2.8 (L) 3.5 - 5.0 g/dL   AST 235 (H) 15 - 41 U/L   ALT 60 (H) 0 - 44 U/L   Alkaline Phosphatase 69 38 - 126 U/L    Total Bilirubin 3.0 (H) 0.3 - 1.2 mg/dL   GFR, Estimated 15 (L) >60 mL/min    Comment: (NOTE) Calculated using the CKD-EPI Creatinine Equation (2021)    Anion gap 12 5 - 15    Comment: Performed at [facility], [address], [place], [place] [address]  CBC WITH DIFFERENTIAL     Status: Abnormal   Collection Time: 08/30/20  7:47 PM  Result Value Ref Range   WBC 15.6 (H) 4.0 - 10.5 K/uL   RBC 3.48 (L) 4.22 - 5.81 MIL/uL   Hemoglobin 10.5 (L) 13.0 - 17.0 g/dL   HCT 31.5 (L) 40.0 - 86.7 %   MCV 91.1 80.0 - 100.0 fL   MCH 30.2 26.0 - 34.0 pg   MCHC 33.1 30.0 - 36.0 g/dL   RDW 61.9 50.9 - 32.6 %   Platelets 136 (L) 150 - 400 K/uL   nRBC 0.0 0.0 - 0.2 %   Neutrophils Relative % 85 %   Neutro Abs 13.3 (H) 1.7 - 7.7 K/uL   Lymphocytes Relative 6 %   Lymphs Abs 0.9 0.7 - 4.0 K/uL   Monocytes Relative 5 %   Monocytes Absolute 0.8 0.1 - 1.0 K/uL   Eosinophils Relative 0 %   Eosinophils Absolute 0.0 0.0 - 0.5 K/uL   Basophils Relative 0 %   Basophils Absolute 0.0 0.0 - 0.1 K/uL   Immature Granulocytes 4 %   Abs Immature Granulocytes 0.54 (H) 0.00 - 0.07 K/uL    Comment: Performed at [facility], [address] [place], [place] [address]  Protime-INR     Status: Abnormal   Collection Time: 08/30/20  7:47 PM  Result Value Ref Range   Prothrombin Time 17.9 (H) 11.4 - 15.2 seconds   INR 1.5 (H) 0.8 - 1.2    Comment: (NOTE) INR goal varies based on device and disease states. Performed at [facility], [address], [place], [place] [address]   APTT     Status: Abnormal   Collection Time: 08/30/20  7:47 PM  Result Value Ref Range   aPTT 53 (H) 24 - 36 seconds    Comment:  IF BASELINE aPTT IS ELEVATED, SUGGEST PATIENT RISK ASSESSMENT BE USED TO DETERMINE APPROPRIATE ANTICOAGULANT THERAPY. Performed at [facility], [address] [place], [place] [address]   Hepatitis panel, acute     Status: None   Collection Time: 08/30/20  7:47 PM  Result Value Ref Range    Hepatitis B Surface Ag NON REACTIVE NON REACTIVE   HCV Ab NON REACTIVE NON REACTIVE    Comment: (NOTE) Nonreactive HCV antibody screen is consistent with no HCV infections,  unless recent infection is suspected or other evidence exists to indicate HCV infection.     Hep A IgM NON REACTIVE NON REACTIVE   Hep B C IgM NON REACTIVE NON REACTIVE    Comment: Performed at [facility], [address], [place], [place] [address]  Acetaminophen level     Status: None   Collection Time: 08/30/20  7:47 PM  Result Value Ref Range   Acetaminophen (Tylenol), Serum 14 10 - 30 ug/mL    Comment: (NOTE) Therapeutic concentrations vary significantly. A range of 10-30 ug/mL  may be an effective concentration for many patients. However, some  are best treated at concentrations outside of this range. Acetaminophen concentrations >150 ug/mL at 4 hours after ingestion  and >50 ug/mL at 12 hours after ingestion are often associated with  toxic reactions.  Performed at [facility], [address], [place], [place] [address]   Lactic acid, plasma     Status: Abnormal   Collection Time: 08/30/20 10:15 PM  Result Value Ref Range   Lactic Acid, Venous 2.4 (HH) 0.5 - 1.9 mmol/L    Comment: CRITICAL VALUE NOTED.  VALUE IS CONSISTENT WITH PREVIOUSLY REPORTED AND CALLED VALUE. Performed at [facility], [address], [place], [place] [address]   Troponin I (High Sensitivity)     Status: Abnormal   Collection Time: 08/30/20 10:15 PM  Result Value Ref Range   Troponin I (High Sensitivity) 482 (HH) <18 ng/L    Comment: CRITICAL VALUE NOTED.  VALUE IS CONSISTENT WITH PREVIOUSLY REPORTED AND CALLED VALUE. (NOTE) Elevated high sensitivity troponin I (hsTnI) values and significant  changes across serial measurements may suggest ACS but many other  chronic and acute conditions are known to elevate hsTnI results.  Refer to the Links section for chest pain algorithms and additional  guidance. Performed at  [facility], [address], [place], [place] [address]   Ethanol     Status: None   Collection Time: 08/30/20 10:15 PM  Result Value Ref Range   Alcohol, Ethyl (B) <10 <10 mg/dL    Comment: (NOTE) Lowest detectable limit for serum alcohol is 10 mg/dL.  For medical purposes only. Performed at [facility], [address], [place], [place] [address]   HIV Antibody (routine testing w rflx)     Status: None   Collection Time: 08/30/20 10:15 PM  Result Value Ref Range   HIV Screen 4th Generation wRfx Non Reactive Non Reactive    Comment: Performed at [facility], [address], [place], [place] [address]  Urinalysis, Routine w reflex microscopic Urine, Clean Catch     Status: Abnormal   Collection Time: 08/31/20  1:30 AM  Result Value Ref Range   Color, Urine AMBER (A) YELLOW    Comment: BIOCHEMICALS MAY BE AFFECTED BY COLOR   APPearance CLOUDY (A) CLEAR   Specific Gravity, Urine 1.017 1.005 - 1.030   pH 5.0 5.0 - 8.0   Glucose, UA NEGATIVE NEGATIVE mg/dL  Hgb urine dipstick LARGE (A) NEGATIVE   Bilirubin Urine NEGATIVE NEGATIVE   Ketones, ur NEGATIVE NEGATIVE mg/dL   Protein, ur 161 (A) NEGATIVE mg/dL   Nitrite NEGATIVE NEGATIVE   Leukocytes,Ua NEGATIVE NEGATIVE   RBC / HPF 6-10 0 - 5 RBC/hpf   WBC, UA 11-20 0 - 5 WBC/hpf   Bacteria, UA RARE (A) NONE SEEN   Squamous Epithelial / LPF 0-5 0 - 5   WBC Clumps PRESENT    Mucus PRESENT    Amorphous Crystal PRESENT     Comment: Performed at [facility], [address], [place], [place] [address]  Lactic acid, plasma     Status: None   Collection Time: 08/31/20  1:54 AM  Result Value Ref Range   Lactic Acid, Venous 1.5 0.5 - 1.9 mmol/L    Comment: Performed at [facility], [address] [place], [place] [address]  Comprehensive metabolic panel     Status: Abnormal   Collection Time: 08/31/20  4:14 AM  Result Value Ref Range   Sodium 134 (L) 135 - 145 mmol/L   Potassium 3.3 (L) 3.5 - 5.1 mmol/L   Chloride 102 98 - 111  mmol/L   CO2 20 (L) 22 - 32 mmol/L   Glucose, Bld 143 (H) 70 - 99 mg/dL    Comment: Glucose reference range applies only to samples taken after fasting for at least 8 hours.   BUN 54 (H) 8 - 23 mg/dL   Creatinine, Ser 1.19 (H) 0.61 - 1.24 mg/dL   Calcium 7.3 (L) 8.9 - 10.3 mg/dL   Total Protein 6.5 6.5 - 8.1 g/dL   Albumin 2.6 (L) 3.5 - 5.0 g/dL   AST 147 (H) 15 - 41 U/L   ALT 58 (H) 0 - 44 U/L   Alkaline Phosphatase 59 38 - 126 U/L   Total Bilirubin 4.1 (H) 0.3 - 1.2 mg/dL   GFR, Estimated 14 (L) >60 mL/min    Comment: (NOTE) Calculated using the CKD-EPI Creatinine Equation (2021)    Anion gap 12 5 - 15    Comment: Performed at [facility], [address], [place], [place] [address]  CBC     Status: Abnormal   Collection Time: 08/31/20  4:14 AM  Result Value Ref Range   WBC 18.3 (H) 4.0 - 10.5 K/uL   RBC 3.35 (L) 4.22 - 5.81 MIL/uL   Hemoglobin 10.1 (L) 13.0 - 17.0 g/dL   HCT 21.3 (L) 08.6 - 57.8 %   MCV 92.5 80.0 - 100.0 fL   MCH 30.1 26.0 - 34.0 pg   MCHC 32.6 30.0 - 36.0 g/dL   RDW 46.9 62.9 - 52.8 %   Platelets 137 (L) 150 - 400 K/uL   nRBC 0.0 0.0 - 0.2 %    Comment: Performed at [facility], [address] [place], [place] [address]  Protime-INR     Status: Abnormal   Collection Time: 08/31/20  4:14 AM  Result Value Ref Range   Prothrombin Time 21.7 (H) 11.4 - 15.2 seconds   INR 2.0 (H) 0.8 - 1.2    Comment: (NOTE) INR goal varies based on device and disease states. Performed at [facility], [address], [place], [place] [address]   APTT     Status: Abnormal   Collection Time: 08/31/20  4:14 AM  Result Value Ref Range   aPTT 53 (H) 24 - 36 seconds    Comment:        IF BASELINE aPTT IS  ELEVATED, SUGGEST PATIENT RISK ASSESSMENT BE USED TO DETERMINE APPROPRIATE ANTICOAGULANT THERAPY. Performed at [facility], [address], [place], [place] [address]   Magnesium     Status: None   Collection Time: 08/31/20  4:14 AM  Result Value Ref Range   Magnesium  2.0 1.7 - 2.4 mg/dL    Comment: Performed at [facility], [address] [place], [place] [address]  Phosphorus     Status: None   Collection Time: 08/31/20  4:14 AM  Result Value Ref Range   Phosphorus 3.5 2.5 - 4.6 mg/dL    Comment: Performed at [facility], [address], [place], [place] [address]  Bilirubin, direct     Status: Abnormal   Collection Time: 08/31/20  4:14 AM  Result Value Ref Range   Bilirubin, Direct 2.7 (H) 0.0 - 0.2 mg/dL    Comment: Performed at [facility], [address], [place], [place] [address]  Lactic acid, plasma     Status: None   Collection Time: 08/31/20  4:14 AM  Result Value Ref Range   Lactic Acid, Venous 1.4 0.5 - 1.9 mmol/L    Comment: Performed at [facility], [address] [place], [place] [address]  MRSA PCR Screening     Status: None   Collection Time: 08/31/20 11:36 AM   Specimen: Nasal Mucosa; Nasopharyngeal  Result Value Ref Range   MRSA by PCR NEGATIVE NEGATIVE    Comment:        The GeneXpert MRSA Assay (FDA approved for NASAL specimens only), is one component of a comprehensive MRSA colonization surveillance program. It is not intended to diagnose MRSA infection nor to guide or monitor treatment for MRSA infections. Performed at [facility], [address], [place], [place] [address]   CK     Status: Abnormal   Collection Time: 08/31/20  2:18 PM  Result Value Ref Range   Total CK 28,083 (H) 49 - 397 U/L    Comment: RESULTS CONFIRMED BY MANUAL DILUTION Performed at [facility], [address], [place], [place] [address]   Vancomycin, random     Status: None   Collection Time: 08/31/20  7:12 PM  Result Value Ref Range   Vancomycin Rm 15     Comment:        Random Vancomycin therapeutic range is dependent on dosage and time of specimen collection. A peak range is 20.0-40.0 ug/mL A trough range is 5.0-15.0 ug/mL        Performed at [facility], [address] [place], [place] [address]   Creatinine, serum      Status: Abnormal   Collection Time: 08/31/20  7:12 PM  Result Value Ref Range   Creatinine, Ser 5.77 (H) 0.61 - 1.24 mg/dL   GFR, Estimated 10 (L) >60 mL/min    Comment: (NOTE) Calculated using the CKD-EPI Creatinine Equation (2021) Performed at [facility], [address] [place], [place] [address]     US Venous Img Lower  Left (DVT Study)  Result Date: 08/30/2020 CLINICAL DATA:  Pain and edema EXAM: LEFT LOWER EXTREMITY VENOUS DOPPLER ULTRASOUND TECHNIQUE: Gray-scale sonography with compression, as well as color and duplex ultrasound, were performed to evaluate the deep venous system(s) from the level of the common femoral vein through the popliteal and proximal calf veins. COMPARISON:  None. FINDINGS: VENOUS Normal compressibility of the common femoral, superficial femoral, and popliteal veins. The calf veins were not well visualized. Visualized portions of profunda femoral vein and great saphenous vein unremarkable.  No filling defects to suggest DVT on grayscale or color Doppler imaging. Doppler waveforms show normal direction of venous flow, normal respiratory plasticity and response to augmentation. Limited views of the contralateral common femoral vein are unremarkable. OTHER There is an abnormally enlarged lymph node in the left inguinal region measuring 3.6 x 1.7 x 3.3 cm (previously measuring approximately 1.8 x 1.9 cm on the patient's CT from March 01, 2020). This lymph node does not appear to demonstrate a normal fatty hilum. Limitations: none IMPRESSION: 1. No DVT, however the calf veins were not well visualized on this study. 2. There is an abnormally enlarged lymph node in the left inguinal region measuring 3.6 x 1.7 x 3.3 cm (previously measuring 1.9 x 1.8 cm on the patient's prior CT). While this may represent a reactive lymph node, a normal fatty hilum is not visualized. As such, underlying malignancy cannot be excluded. Short interval follow-up ultrasound or outpatient percutaneous  biopsy is recommended. Electronically Signed   By: Constance Holster M.D.   On: 08/30/2020 18:23   DG Chest Port 1 View  Result Date: 08/30/2020 CLINICAL DATA:  Fever, recent food poisoning EXAM: PORTABLE CHEST 1 VIEW COMPARISON:  03/01/2020, 06/19/2019 FINDINGS: Single frontal view of the chest demonstrates stable enlarged cardiac silhouette. Stable enlargement of the aortic arch, with underlying stent, compatible with repair of prior thoracic aortic dissection. Progressive decrease in size of the aortic arch consistent with thrombosis of the false lumen. No acute airspace disease, effusion, or pneumothorax. No acute bony abnormalities. IMPRESSION: 1. 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 2. No acute intrathoracic process. Electronically Signed   By: Randa Ngo M.D.   On: 08/30/2020 19:29   CT EXTREMITY LOWER LEFT WO CONTRAST  Result Date: 08/31/2020 CLINICAL DATA:  Edema and swelling over left lower extremity EXAM: CT OF THE LOWER LEFT EXTREMITY WITHOUT CONTRAST TECHNIQUE: Multidetector CT imaging of the lower left extremity was performed according to the standard protocol. COMPARISON:  Ultrasound 08/30/2020, CT angiography 03/01/2020, 06/19/2019 FINDINGS: Bones/Joint/Cartilage No fracture or malalignment. No periostitis or bony destructive change. No significant hip or knee effusion is visualized. Ligaments Suboptimally assessed by CT. Muscles and Tendons No significant atrophy.  No intramuscular fluid collections. Soft tissues Mild vascular calcifications. Focal distortion within the left groin with surgical clips, unchanged and presumably due to surgical scarring. Multiple enlarged left inguinal lymph nodes, the largest measures 3.01 2.7 by 2.5 cm and corresponds to the ultrasound demonstrated abnormal lymph node. Moderate subcutaneous edema and fluid within the left lower extremity  extending from the hip to the imaged proximal lower leg. Edema most heavily concentrated at the distal thigh and knee. Skin thickening and edematous infiltration of subcutaneous fat of the anterior, medial, and lateral thigh with small amount of fluid superficial to the lateral quadriceps muscles. No gas containing fluid collections.   IMPRESSION: 1. Moderate subcutaneous edema within the left lower extremity extending from the hip to the imaged proximal lower leg, possible cellulitis or nonspecific lower extremity edema. No gas containing fluid collections are seen to suggest soft tissue abscess. Note that assessment for abscess is limited without contrast. There is no acute osseous abnormality. 2. Multiple enlarged left inguinal lymph nodes, the largest measuring up to 3 cm, corresponding to the ultrasound demonstrated abnormal lymph node on recent ultrasound. Findings are indeterminate for reactive adenopathy or malignant adenopathy. Tissue sampling was previously suggested. 3. No acute osseous  abnormality. Electronically Signed   By: Jasmine Pang M.D.   On: 08/31/2020 21:18    Review of Systems  Constitutional: Positive for fever.  Gastrointestinal: Positive for diarrhea.  Neurological: Negative for numbness.   Blood pressure 113/62, pulse 84, temperature 100 F (37.8 C), temperature source Axillary, resp. rate (!) 25, height 5\' 11"  (1.803 m), weight 114.8 kg, SpO2 96 %. Physical Exam   Patient is 114 kg otherwise normal appearance and development with no active developmental abnormalities He was alert and gave a good history he had had some pain medication which she says does control his pain Affect normal  His right leg is not swollen or tender hip and knee and ankle range of motion is normal all joints are stable muscle tone is normal  His left leg is tender and swollen swelling really starts in his foot and goes all the way up to his thigh it is warm to touch is erythematous and most  importantly all compartments are soft. Color capillary refill is normal compartment syndrome physical findings are negative he is able to move his right foot up and down he can wiggle his toes even bend his knee a little bit as well as his hip.  No joints are unstable muscle tone is normal    Assessment/Plan:  Outside records: aug 2021 HPI: Ahmet Schank is a 65 y.o. male here today for follow-up.  I saw the patient initially in May of this year.  He did moved from [place].  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  Had developed renal failure which resolved.  Also had a sternal wound infection which resolved.  Apparently there had been plan for staged surgery.  When I saw him I did not see any indication for any other intervention.  He appeared to have a stable dissection and no malperfusion.  His most recent CT has been in October 2020 and I recommended repeat CT scan to determine if there have been any progression.  He has had no evidence of malperfusion and has no new medical problems.  CT scan of his chest abdomen and pelvis was reviewed with the patient.  This was from 03/01/2020.  This shows stable dissection.  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  Maximal diameter decreased from 6.0-5.4 in the chest.  Also decreased from 4.9-4.5 at the level of the celiac artery in the abdomen    No evidence of compartment syndrome.  CAT scan was done showed soft tissue swelling in the subcutaneous fat no muscle damage.  CT scan without contrast was done secondary to elevated CK levels although no evidence of muscle damage has been found  Recommend continue blood culture specific IV antibiotics.  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  He is not a  surgical candidate at [facility].  [facility] 08/31/2020, 10:53 PM

## 2020-08-31 NOTE — Evaluation (Signed)
Physical Therapy Evaluation Patient Details Name: Connor [name] MRN: [id number] DOB: 1956/07/26 Today's Date: 08/31/2020   History of Present Illness  Connor [name] is a 65 y.o. male with medical history significant for aortic dissection, A. fib on Eliquis hypertension and obesity who presents to the emergency department due to 5-day onset of nausea, vomiting and diarrhea.  Patient states that he had a "food poisoning" after eating meatloaf on Thursday (08/25/2020), he endorsed several episodes of nonbloody, nonbilious vomiting which stopped on Saturday (1/2), diarrhea was several episodes daily and watery in nature with last episode being this morning she complained of fever that started today and he also complained of pain and swelling in his left leg.  Patient lives at home with his cousin, he ambulates with a cane at baseline and he has not had Covid vaccine.    Clinical Impression  Patient demonstrates slow labored movement for sitting up at bedside with c/o severe pain left upper thigh area requiring Mod assist to move LLE, unable to attempt sit to stands without AD, required use of RW and limited to a few side steps at bedside due to c/o fatigue, weakness and increased left upper thigh pain.  Patient put back to bed after therapy.  Patient will benefit from continued physical therapy in hospital and recommended venue below to increase strength, balance, endurance for safe ADLs and gait.     Follow Up Recommendations SNF    Equipment Recommendations  Rolling walker with 5" wheels    Recommendations for Other Services       Precautions / Restrictions Precautions Precautions: Fall Restrictions Weight Bearing Restrictions: No      Mobility  Bed Mobility Overal bed mobility: Needs Assistance Bed Mobility: Supine to Sit;Sit to Supine     Supine to sit: Min assist;Mod assist Sit to supine: Min assist;Mod assist   General bed mobility comments: requires assistance to move LLE due  to c/o severe pain with movement    Transfers Overall transfer level: Needs assistance Equipment used: Rolling walker (2 wheeled) Transfers: Sit to/from Stand Sit to Stand: Min assist;Mod assist         General transfer comment: limited weightbearing on LLE due to increased pain  Ambulation/Gait Ambulation/Gait assistance: Mod assist Gait Distance (Feet): 4 Feet Assistive device: Rolling walker (2 wheeled) Gait Pattern/deviations: Decreased step length - right;Decreased step length - left;Decreased stance time - left;Decreased stride length;Antalgic Gait velocity: decreased   General Gait Details: limited to 4-5 slow labored unsteady side steps due to increased LLE pain  Stairs            Wheelchair Mobility    Modified Rankin (Stroke Patients Only)       Balance Overall balance assessment: Needs assistance Sitting-balance support: Feet supported;No upper extremity supported Sitting balance-Leahy Scale: Fair Sitting balance - Comments: fair/good seated at EOB   Standing balance support: During functional activity;Bilateral upper extremity supported Standing balance-Leahy Scale: Fair Standing balance comment: using RW                             Pertinent Vitals/Pain Pain Assessment: Faces Faces Pain Scale: Hurts worst Pain Location: LLE upper thigh area with movement or pressure Pain Descriptors / Indicators: Grimacing;Guarding;Sore Pain Intervention(s): Limited activity within patient's tolerance;Monitored during session;Repositioned    Home Living Family/patient expects to be discharged to:: Private residence Living Arrangements: Other relatives Available Help at Discharge: Family;Available PRN/intermittently Type of Home: Mobile home Home Access:  Ramped entrance     Home Layout: One level Home Equipment: Cane - single point      Prior Function Level of Independence: Independent with assistive device(s)         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[employer], drives, 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     Hand Dominance        Extremity/Trunk Assessment   Upper Extremity Assessment Upper Extremity Assessment: Generalized weakness    Lower Extremity Assessment Lower Extremity Assessment: Generalized weakness;LLE deficits/detail LLE Deficits / Details: grossly 3+/5 LLE: Unable to fully assess due to pain LLE Sensation: WNL LLE Coordination: WNL    Cervical / Trunk Assessment Cervical / Trunk Assessment: Normal  Communication   Communication: No difficulties  Cognition Arousal/Alertness: Awake/alert Behavior During Therapy: WFL for tasks assessed/performed Overall Cognitive Status: Within Functional Limits for tasks assessed                                        General Comments      Exercises     Assessment/Plan    PT Assessment Patient needs continued PT services  PT Problem List Decreased strength;Decreased activity tolerance;Decreased balance;Decreased mobility       PT Treatment Interventions Balance training;DME instruction;Gait training;Stair training;Functional mobility training;Therapeutic activities;Therapeutic exercise;Patient/family education    PT Goals (Current goals can be found in the Care Plan section)  Acute Rehab PT Goals Patient Stated Goal: return home able to take care of self PT Goal Formulation: With patient Time For Goal Achievement: 09/14/20 Potential to Achieve Goals: Good    Frequency Min 3X/week   Barriers to discharge        Co-evaluation               AM-PAC PT "6 Clicks" Mobility  Outcome Measure Help needed turning from your back to your side while in a flat bed without using bedrails?: A Lot Help needed moving from lying on your back to sitting on the side of a flat bed without using bedrails?: A Lot Help needed moving to and from a bed to a chair (including a wheelchair)?: A Lot Help needed standing up from a chair using your  arms (e.g., wheelchair or bedside chair)?: A Lot Help needed to walk in hospital room?: A Lot Help needed climbing 3-5 steps with a railing? : Total 6 Click Score: 11    End of Session   Activity Tolerance: Patient tolerated treatment well;Patient limited by fatigue Patient left: in bed;with call bell/phone within reach Nurse Communication: Mobility status PT Visit Diagnosis: Unsteadiness on feet (R26.81);Other abnormalities of gait and mobility (R26.89);Muscle weakness (generalized) (M62.81)    Time: 1607-3710 PT Time Calculation (min) (ACUTE ONLY): 24 min   Charges:   PT Evaluation $PT Eval Moderate Complexity: 1 Mod PT Treatments $Therapeutic Activity: 23-37 mins        2:44 PM, 08/31/20 Ocie Bob, MPT Physical Therapist with [facility] 336 [phone] office [phone] mobile phone

## 2020-08-31 NOTE — Plan of Care (Signed)
  Problem: Acute Rehab PT Goals(only PT should resolve) Goal: Pt Will Go Supine/Side To Sit Outcome: Progressing Flowsheets (Taken 08/31/2020 1446) Pt will go Supine/Side to Sit:  with min guard assist  with minimal assist Goal: Patient Will Transfer Sit To/From Stand Outcome: Progressing Flowsheets (Taken 08/31/2020 1446) Patient will transfer sit to/from stand: with minimal assist Goal: Pt Will Transfer Bed To Chair/Chair To Bed Outcome: Progressing Flowsheets (Taken 08/31/2020 1446) Pt will Transfer Bed to Chair/Chair to Bed: with min assist Goal: Pt Will Ambulate Outcome: Progressing Flowsheets (Taken 08/31/2020 1446) Pt will Ambulate:  50 feet  with minimal assist  with rolling walker   2:47 PM, 08/31/20 Ocie Bob, MPT Physical Therapist with [facility] 336 [phone] office [phone] mobile phone

## 2020-08-31 NOTE — TOC Initial Note (Signed)
Transition of Care [facility]) - Initial/Assessment Note    Patient Details  Name: Connor [name] MRN: [id number] Date of Birth: 1956-06-09  [facility]) CM/SW Contact:    Karn Cassis, LCSW Phone Number: 08/31/2020, 3:03 PM  Clinical Narrative:  Pt admitted due to sepsis secondary to left leg cellulitis and/or infectious diarrhea. Pt reports he lives with his cousin. Pt is independent with ADLs at baseline and usually assists his cousin as needed. PT evaluated pt and recommend SNF. LCSW discussed placement process, including that pt would have to stay 30 days at [facility] and potentially turn check over to facility for month. He states that this is all a lot to take in at one time and he needs to process it. However, he admits he can't manage at home the way he is right now. LCSW agreed to follow up tomorrow to discuss further.                  Expected Discharge Plan: Skilled Nursing Facility Barriers to Discharge: Continued Medical Work up   Patient Goals and CMS Choice Patient states their goals for this hospitalization and ongoing recovery are:: unsure at this time   Choice offered to / list presented to : Patient  Expected Discharge Plan and Services Expected Discharge Plan: Skilled Nursing Facility In-house Referral: Clinical Social Work     Living arrangements for the past 2 months: Single Family Home                                      Prior Living Arrangements/Services Living arrangements for the past 2 months: Single Family Home Lives with:: Relatives Patient language and need for interpreter reviewed:: Yes        Need for Family Participation in Patient Care: No (Comment)     Criminal Activity/Legal Involvement Pertinent to Current Situation/Hospitalization: No - Comment as needed  Activities of Daily Living      Permission Sought/Granted                  Emotional Assessment     Affect (typically observed):  Appropriate Orientation: : Oriented to Self,Oriented to Place,Oriented to  Time,Oriented to Situation Alcohol / Substance Use: Not Applicable Psych Involvement: No (comment)  Admission diagnosis:  PAF (paroxysmal atrial fibrillation) (HCC) [I48.0] Elevated troponin [R77.8] Elevated LFTs [R79.89] Cellulitis of left leg [L03.116] AKI (acute kidney injury) (HCC) [N17.9] Sepsis (HCC) [A41.9] Sepsis with acute renal failure without septic shock, due to unspecified organism, unspecified acute renal failure type (HCC) [A41.9, R65.20, N17.9] Patient Active Problem List   Diagnosis Date Noted  . Sepsis (HCC) 08/30/2020  . Left leg cellulitis 08/30/2020  . Lactic acidosis 08/30/2020  . Elevated troponin 08/30/2020  . Hypoalbuminemia 08/30/2020  . Leukocytosis 08/30/2020  . Thrombocytopenia (HCC) 08/30/2020  . Hypokalemia 08/30/2020  . Hyperglycemia 08/30/2020  . Transaminitis 08/30/2020  . Total bilirubin, elevated 08/30/2020  . AKI (acute kidney injury) (HCC) 08/30/2020  . Nausea vomiting and diarrhea 08/30/2020  . Hypertension 05/20/2020  . Persistent atrial fibrillation (HCC)    PCP:  Patient, No Pcp Per Pharmacy:   [id number] - [place], [place] - [address] [place] [address] Phone: [phone] Fax: [phone]     Social Determinants of Health (SDOH) Interventions    Readmission Risk Interventions No  flowsheet data found.

## 2020-08-31 NOTE — Progress Notes (Signed)
Pharmacy Antibiotic Note  Connor [name] is a 65 y.o. male admitted on 08/30/2020 with sepsis.  Pharmacy has been consulted for Vancomycin/Cefepime dosing. ?source as cellulitis or intra-abdominal. WBC elevated. AKI present.   Plan: Vancomycin 1500 mg IV q48h Cefepime 2g IV q24h Trend WBC, temp, renal function  F/U infectious work-up Drug levels as indicated   Height: 5\' 11"  (180.3 cm) Weight: 112.9 kg (249 lb) IBW/kg (Calculated) : 75.3  Temp (24hrs), Avg:101 F (38.3 C), Min:99.7 F (37.6 C), Max:102.7 F (39.3 C)  Recent Labs  Lab 08/30/20 1947 08/30/20 2215 08/31/20 0154  WBC 15.6*  --   --   CREATININE 4.21*  --   --   LATICACIDVEN 2.1* 2.4* 1.5    Estimated Creatinine Clearance: 22.9 mL/min (A) (by C-G formula based on SCr of 4.21 mg/dL (H)).    No Known Allergies  10/29/20, PharmD, BCPS Clinical Pharmacist Phone: [phone]

## 2020-08-31 NOTE — Progress Notes (Signed)
PHARMACY - PHYSICIAN COMMUNICATION CRITICAL VALUE ALERT - BLOOD CULTURE IDENTIFICATION (BCID)  Connor [name] is an 65 y.o. male who presented to [facility] on 08/30/2020 with a chief complaint of NVD  Assessment:  Blood culture positive for strep species in 3/4 bottles, two sets (include suspected source if known)  Name of physician (or Provider) Contacted: Dr Maryfrances Bunnell  Current antibiotics: vancomycin and cefepime  Changes to prescribed antibiotics recommended: Ceftriaxone 2gm iv q24h Recommendations accepted by provider  Results for orders placed or performed during the hospital encounter of 08/30/20  Blood Culture ID Panel (Reflexed) (Collected: 08/30/2020  7:35 PM)  Result Value Ref Range   Enterococcus faecalis NOT DETECTED NOT DETECTED   Enterococcus Faecium NOT DETECTED NOT DETECTED   Listeria monocytogenes NOT DETECTED NOT DETECTED   Staphylococcus species NOT DETECTED NOT DETECTED   Staphylococcus aureus (BCID) NOT DETECTED NOT DETECTED   Staphylococcus epidermidis NOT DETECTED NOT DETECTED   Staphylococcus lugdunensis NOT DETECTED NOT DETECTED   Streptococcus species DETECTED (A) NOT DETECTED   Streptococcus agalactiae NOT DETECTED NOT DETECTED   Streptococcus pneumoniae NOT DETECTED NOT DETECTED   Streptococcus pyogenes NOT DETECTED NOT DETECTED   A.calcoaceticus-baumannii NOT DETECTED NOT DETECTED   Bacteroides fragilis NOT DETECTED NOT DETECTED   Enterobacterales NOT DETECTED NOT DETECTED   Enterobacter cloacae complex NOT DETECTED NOT DETECTED   Escherichia coli NOT DETECTED NOT DETECTED   Klebsiella aerogenes NOT DETECTED NOT DETECTED   Klebsiella oxytoca NOT DETECTED NOT DETECTED   Klebsiella pneumoniae NOT DETECTED NOT DETECTED   Proteus species NOT DETECTED NOT DETECTED   Salmonella species NOT DETECTED NOT DETECTED   Serratia marcescens NOT DETECTED NOT DETECTED   Haemophilus influenzae NOT DETECTED NOT DETECTED   Neisseria meningitidis NOT DETECTED NOT DETECTED    Pseudomonas aeruginosa NOT DETECTED NOT DETECTED   Stenotrophomonas maltophilia NOT DETECTED NOT DETECTED   Candida albicans NOT DETECTED NOT DETECTED   Candida auris NOT DETECTED NOT DETECTED   Candida glabrata NOT DETECTED NOT DETECTED   Candida krusei NOT DETECTED NOT DETECTED   Candida parapsilosis NOT DETECTED NOT DETECTED   Candida tropicalis NOT DETECTED NOT DETECTED   Cryptococcus neoformans/gattii NOT DETECTED NOT DETECTED    [name] 08/31/2020  5:21 PM

## 2020-08-31 NOTE — Progress Notes (Signed)
[facility] PROGRESS NOTE    Connor [name]  JKD:[id number] DOB: Dec 06, 1955 DOA: 08/30/2020 PCP: Patient, No Pcp Per      Brief Narrative:  Connor [name] is a 65 y.o. 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  Patient first thought he got a "stomach bug" 1 week ago, with malaise, chills, nausea, vomiting and diarrhea.  After a few days, he started to have left leg pain and swelling.  This pain and swelling progressed until it was severe and he came to the ER.  In the ER, patient febrile to 102F, tachycardic to 150.  WBC 15K.  Lactate 2.1.  Total bilirubin elevated.  Cr 4.2 (from recent baseline 1.3).  K 2.8.   Blood cultures obtained, patient started on antibiotics for cellulitis and admitted.           Assessment & Plan:  Severe sepsis Patient presents with elevated lactate, tachycardia, fever and liver injury and renal failure.  Blood cultures growing streptococci.  Given leg and that necrotizing fasciitis is within the differential, will keep spectrum broad for now  -Continue vancomycin -Continue cefepime -Add clindamycin  -Consult Orthopedics, apppreciate cares -Obtain CT leg LEFT without contrast to eval gas, nec fasc        Left leg swelling, concern for early compartment syndrome or necrotizing fasciitis  The left LLE [place] is negative for DVT.  The foot is warm and has good dopplerable pulses and there is sensation, do not suspect ischemia (despite advanced vascular disease, including left iliac stent)  Diagnostic considerations for his swelling include cellulitis, also hematoma resulting in compartment syndrome.  Nec fasc seems unlikely but is considered.  -Consult orthopedics,  appreciate cares  -Obtain CT left leg -Arterial duplex if stable tomorrow     Rhabdomyolysis UA showed strong heme, few RBCs.  CK measured and >28,000.  Patient denies trauma, prolonged down time on this leg.  Not on medications related to it.  Not exertional.    Strongly favor this is infectious, less likely compartment syndrome, even less likely inflammatory. -Trend CK     Acute renal failure Due to rhabdo  At this point has had 4L fluids, little urine output.  Has basline Cr 1.3, also hx of ARF requiring CVVH in 2019.    UA also notable for WBC.  -Strict I/Os -Daily BMP -Obtain US renal  -Hold ARB    Elevated bilirubin Probably just septic injury, but will image liver/GB -Obtain US abdomen   Permanent atrial fibrillation Follows with Dr. Bjorn Pippin.  Admitted and started on amiodarone -Hold Eliquis until hematoma ruled out, then restart -Continue amiodarone  Hypertension BP soft due to sepsis -Hold valsartan, spironolactone, nifedipine, labetalol  History ascending aortic dissection Patient has a complicated history of aortic dissection in 2016.  See Dr. Arbie Cookey and Dr. Campbell Lerner notes.  Evidently repaired in [place], developed recurrent dissection at the anastomosis in 2019, went for repair, had left iliac and left common femoral stenting at that time as well as renal artery stenting.    This second episode was complicated by sternal Klebsiella infection, ARF requiring CVVH, and cardiogenic shock EF 15%, now recovered. -Resume labetalol, nifedipine, spironolactone, valsartan as soon as able -Continue aspirin  Left groin nodule  Left inguinal lymph node enlarged on [place], incidental finding. -Repeat US as outpatient or send for inguinal lymph node biopsy   Hypokalemia Repleted -Trend BMP       Disposition: Status is: Inpatient  Remains inpatient appropriate because:IV treatments appropriate due to intensity of illness or inability to take  PO   Dispo: The patient is from: Home              Anticipated d/c is to: SNF              Anticipated d/c date is: > 3 days              Patient currently is not medically stable to d/c.              MDM: The patient is critically ill with multi-organ failure.  Critical care was necessary to treat or prevent imminent or life-threatening deterioration of sepsis, renal failure and was exclusive of separately billable procedures and treating other patients. Total critical care time spent by me: 70 minutes Time spent personally by me on obtaining history from patient or surrogate, evaluation of the patient, evaluation of patient's response to treatment, ordering and review of laboratory studies, ordering and review of radiographic studies, ordering and performing treatments and interventions, and re-evaluation of the patient's condition.     DVT prophylaxis: SCDs Start: 08/30/20 2133  Code Status: FULL Family Communication:            Subjective: Patient has severe left leg pain.  He has had some fever in the last 12 hours.  He has still some nausea and chills.  He can feel in the feet, and move the dose.  No confusion.  Objective: Vitals:   08/31/20 1700 08/31/20 1730 08/31/20 1800 08/31/20 1830  BP: (!) 117/59 112/60 (!) 101/54 (!) 93/47  Pulse: 80 85 83 84  Resp: (!) 26 (!) 24 (!) 23 (!) 21  Temp:      TempSrc:      SpO2: 99% 98% 96% 98%  Weight:      Height:        Intake/Output Summary (Last 24 hours) at 08/31/2020 1924 Last data filed at 08/31/2020 1827 Gross per 24 hour  Intake 3029.41 ml  Output -  Net 3029.41 ml   Filed Weights   08/30/20 1814 08/31/20 1156  Weight: 112.9 kg 114.8 kg    Examination: General appearance:  adult male, awake but subdued and in moderate distress from pain.   HEENT: Anicteric, conjunctiva pink, lids and lashes normal. No nasal deformity, discharge, epistaxis.  Lips moist, dentition normal, oropharynx moist, no oral  lesions, hearing normal.   Skin: No suspicious rashes or lesions on the face, neck, chest, abdomen, or legs with the exception of edema of the left leg, and bruising of the left thigh on the lateral side Cardiac: Irregularly irregular, nl S1-S2, no murmurs appreciated.  Pulses palpable in the left foot, dopplerable as well.  Toes warm with cap refill.  JVP not visible.  Right leg normal, left leg with nonpitting edema throughout, very tender.   Respiratory: Normal respiratory rate and rhythm.  CTAB without rales or wheezes. Abdomen: Abdomen soft.  No TTP or guarding. No ascites, distension, hepatosplenomegaly.   MSK: The left leg is swollen throughout, primarily in the calf and the thigh with some bruising on the left, edema, and tenderness, without firmness. Neuro: Awake and alert.  EOMI, moves upper extremities with normal strength and coordination. Speech fluent.  Psych: Sensorium intact and responding to questions, attention blunted by pain. Affect blunted by pain.  Judgment and insight appear normal.    Data Reviewed: I have personally reviewed following labs and imaging studies:  CBC: Recent Labs  Lab 08/30/20 1947 08/31/20 0414  WBC 15.6* 18.3*  NEUTROABS 13.3*  --   HGB 10.5* 10.1*  HCT 31.7* 31.0*  MCV 91.1 92.5  PLT 136* 137*   Basic Metabolic Panel: Recent Labs  Lab 08/30/20 1947 08/31/20 0414  NA 137 134*  K 2.8* 3.3*  CL 103 102  CO2 22 20*  GLUCOSE 162* 143*  BUN 55* 54*  CREATININE 4.21* 4.56*  CALCIUM 7.8* 7.3*  MG  --  2.0  PHOS  --  3.5   GFR: Estimated Creatinine Clearance: 21.4 mL/min (A) (by C-G formula based on SCr of 4.56 mg/dL (H)). Liver Function Tests: Recent Labs  Lab 08/30/20 1947 08/31/20 0414  AST 279* 274*  ALT 60* 58*  ALKPHOS 69 59  BILITOT 3.0* 4.1*  PROT 7.2 6.5  ALBUMIN 2.8* 2.6*   No results for input(s): LIPASE, AMYLASE in the last 168 hours. No results for input(s): AMMONIA in the last 168 hours. Coagulation  Profile: Recent Labs  Lab 08/30/20 1947 08/31/20 0414  INR 1.5* 2.0*   Cardiac Enzymes: Recent Labs  Lab 08/31/20 1418  CKTOTAL 28,083*   BNP (last 3 results) Recent Labs    09/08/19 1645  PROBNP 719*   HbA1C: No results for input(s): HGBA1C in the last 72 hours. CBG: Recent Labs  Lab 08/30/20 1940  GLUCAP 163*   Lipid Profile: No results for input(s): CHOL, HDL, LDLCALC, TRIG, CHOLHDL, LDLDIRECT in the last 72 hours. Thyroid Function Tests: No results for input(s): TSH, T4TOTAL, FREET4, T3FREE, THYROIDAB in the last 72 hours. Anemia Panel: No results for input(s): VITAMINB12, FOLATE, FERRITIN, TIBC, IRON, RETICCTPCT in the last 72 hours. Urine analysis:    Component Value Date/Time   COLORURINE AMBER (A) 08/31/2020 0130   APPEARANCEUR CLOUDY (A) 08/31/2020 0130   LABSPEC 1.017 08/31/2020 0130   PHURINE 5.0 08/31/2020 0130   GLUCOSEU NEGATIVE 08/31/2020 0130   HGBUR LARGE (A) 08/31/2020 0130   BILIRUBINUR NEGATIVE 08/31/2020 0130   KETONESUR NEGATIVE 08/31/2020 0130   PROTEINUR 100 (A) 08/31/2020 0130   NITRITE NEGATIVE 08/31/2020 0130   LEUKOCYTESUR NEGATIVE 08/31/2020 0130   Sepsis Labs: @LABRCNTIP (procalcitonin:4,lacticacidven:4)  ) Recent Results (from the past 240 hour(s))  Blood Culture (routine x 2)     Status: None (Preliminary result)   Collection Time: 08/30/20  7:34 PM   Specimen: BLOOD  Result Value Ref Range Status   Specimen Description   Final    BLOOD BLOOD LEFT ARM Performed at [name], [address] [place], [place] [address]    Special Requests   Final    BOTTLES DRAWN AEROBIC AND ANAEROBIC Blood Culture adequate volume Performed at [facility], [address] [place], [place] [address]    Culture  Setup Time   Final    GRAM POSITIVE COCCI IN CHAINS ANAEROBIC BOTTLE ONLY Gram Stain Report Called to,Read Back By and Verified With: MICHAEL DOSS @0905  08/31/20 BY JONES,T [facility] CORRECTED RESULTS PREVIOUSLY REPORTED AS: GRAM  POSITIVE RODS CORRECTED RESULTS CALLED TO: S HURTH PHARMD @1409  08/31/20 EB Performed at [facility], [address] [place], [place] [address]    Culture GRAM POSITIVE COCCI IN CHAINS  Final   Report Status PENDING  Incomplete  Blood Culture (routine x 2)  Status: None (Preliminary result)   Collection Time: 08/30/20  7:35 PM   Specimen: BLOOD RIGHT ARM  Result Value Ref Range Status   Specimen Description   Final    BLOOD RIGHT ARM Performed at [facility], [address], [place], [place] [address]    Special Requests   Final    BOTTLES DRAWN AEROBIC AND ANAEROBIC Blood Culture adequate volume Performed at [facility], [address], [place], [place] [address]    Culture  Setup Time   Final    GRAM POSITIVE COCCI IN CHAINS IN BOTH AEROBIC AND ANAEROBIC BOTTLES Gram Stain Report Called to,Read Back By and Verified With: MICHAEL DOSS @0905  08/31/20 BY JONES,T [facility] Organism ID to follow CORRECTED RESULTS PREVIOUSLY REPORTED AS: GRAM POSITIVE RODS CORRECTED RESULTS CALLED TO: S HURTH PHARMD @1409  08/31/20 EB Performed at [facility], [address], [place], [address] [place]    Culture GRAM POSITIVE COCCI IN CHAINS  Final   Report Status PENDING  Incomplete  Resp Panel by RT-PCR (Flu A&B, Covid) Nasopharyngeal Swab     Status: None   Collection Time: 08/30/20  7:35 PM   Specimen: Nasopharyngeal Swab; Nasopharyngeal(NP) swabs in vial transport medium  Result Value Ref Range Status   SARS Coronavirus 2 by RT PCR NEGATIVE NEGATIVE Final    Comment: (NOTE) SARS-CoV-2 target nucleic acids are NOT DETECTED.  The SARS-CoV-2 RNA is generally detectable in upper respiratory specimens during the acute phase of infection. The lowest concentration of SARS-CoV-2 viral copies this assay can detect is 138 copies/mL. A negative result does not preclude SARS-Cov-2 infection and should not be used as the sole basis for treatment or other patient management decisions. A negative  result may occur with  improper specimen collection/handling, submission of specimen other than nasopharyngeal swab, presence of viral mutation(s) within the areas targeted by this assay, and inadequate number of viral copies(<138 copies/mL). A negative result must be combined with clinical observations, patient history, and epidemiological information. The expected result is Negative.  Fact Sheet for Patients:  16073  Fact Sheet for Healthcare Providers:  10/28/20  This test is no t yet approved or cleared by the [employer] FDA and  has been authorized for detection and/or diagnosis of SARS-CoV-2 by FDA under an Emergency Use Authorization (EUA). This EUA will remain  in effect (meaning this test can be used) for the duration of the COVID-19 declaration under Section 564(b)(1) of the Act, 21 U.S.C.section 360bbb-3(b)(1), unless the authorization is terminated  or revoked sooner.       Influenza A by PCR NEGATIVE NEGATIVE Final   Influenza B by PCR NEGATIVE NEGATIVE Final    Comment: (NOTE) The Xpert Xpress SARS-CoV-2/FLU/RSV plus assay is intended as an aid in the diagnosis of influenza from Nasopharyngeal swab specimens and should not be used as a sole basis for treatment. Nasal washings and aspirates are unacceptable for Xpert Xpress SARS-CoV-2/FLU/RSV testing.  Fact Sheet for Patients: SeriousBroker.it  Fact Sheet for Healthcare Providers: [place]  This test is not yet approved or cleared by the [employer] FDA and has been authorized for detection and/or diagnosis of SARS-CoV-2 by FDA under an Emergency Use Authorization (EUA). This EUA will remain in effect (meaning this test can be used) for the duration of the COVID-19 declaration under Section 564(b)(1) of the Act, 21 U.S.C. section 360bbb-3(b)(1), unless the authorization is  terminated or revoked.  Performed at [facility], [address], [place], [address] [place]   Blood  Culture ID Panel (Reflexed)     Status: Abnormal   Collection Time: 08/30/20  7:35 PM  Result Value Ref Range Status   Enterococcus faecalis NOT DETECTED NOT DETECTED Final   Enterococcus Faecium NOT DETECTED NOT DETECTED Final   Listeria monocytogenes NOT DETECTED NOT DETECTED Final   Staphylococcus species NOT DETECTED NOT DETECTED Final   Staphylococcus aureus (BCID) NOT DETECTED NOT DETECTED Final   Staphylococcus epidermidis NOT DETECTED NOT DETECTED Final   Staphylococcus lugdunensis NOT DETECTED NOT DETECTED Final   Streptococcus species DETECTED (A) NOT DETECTED Final    Comment: Not Enterococcus species, Streptococcus agalactiae, Streptococcus pyogenes, or Streptococcus pneumoniae. CRITICAL RESULT CALLED TO, READ BACK BY AND VERIFIED WITH: G. Coffee PharmD 16:30 08/31/20 ([email or web address])    Streptococcus agalactiae NOT DETECTED NOT DETECTED Final   Streptococcus pneumoniae NOT DETECTED NOT DETECTED Final   Streptococcus pyogenes NOT DETECTED NOT DETECTED Final   A.calcoaceticus-baumannii NOT DETECTED NOT DETECTED Final   Bacteroides fragilis NOT DETECTED NOT DETECTED Final   Enterobacterales NOT DETECTED NOT DETECTED Final   Enterobacter cloacae complex NOT DETECTED NOT DETECTED Final   Escherichia coli NOT DETECTED NOT DETECTED Final   Klebsiella aerogenes NOT DETECTED NOT DETECTED Final   Klebsiella oxytoca NOT DETECTED NOT DETECTED Final   Klebsiella pneumoniae NOT DETECTED NOT DETECTED Final   Proteus species NOT DETECTED NOT DETECTED Final   Salmonella species NOT DETECTED NOT DETECTED Final   Serratia marcescens NOT DETECTED NOT DETECTED Final   Haemophilus influenzae NOT DETECTED NOT DETECTED Final   Neisseria meningitidis NOT DETECTED NOT DETECTED Final   Pseudomonas aeruginosa NOT DETECTED NOT DETECTED Final   Stenotrophomonas maltophilia NOT DETECTED NOT DETECTED Final    Candida albicans NOT DETECTED NOT DETECTED Final   Candida auris NOT DETECTED NOT DETECTED Final   Candida glabrata NOT DETECTED NOT DETECTED Final   Candida krusei NOT DETECTED NOT DETECTED Final   Candida parapsilosis NOT DETECTED NOT DETECTED Final   Candida tropicalis NOT DETECTED NOT DETECTED Final   Cryptococcus neoformans/gattii NOT DETECTED NOT DETECTED Final    Comment: Performed at [facility], [address], [place], [place] [address]         Radiology Studies: US Venous Img Lower  Left (DVT Study)  Result Date: 08/30/2020 CLINICAL DATA:  Pain and edema EXAM: LEFT LOWER EXTREMITY VENOUS DOPPLER ULTRASOUND TECHNIQUE: Gray-scale sonography with compression, as well as color and duplex ultrasound, were performed to evaluate the deep venous system(s) from the level of the common femoral vein through the popliteal and proximal calf veins. COMPARISON:  None. FINDINGS: VENOUS Normal compressibility of the common femoral, superficial femoral, and popliteal veins. The calf veins were not well visualized. Visualized portions of profunda femoral vein and great saphenous vein unremarkable. No filling defects to suggest DVT on grayscale or color Doppler imaging. Doppler waveforms show normal direction of venous flow, normal respiratory plasticity and response to augmentation. Limited views of the contralateral common femoral vein are unremarkable. OTHER There is an abnormally enlarged lymph node in the left inguinal region measuring 3.6 x 1.7 x 3.3 cm (previously measuring approximately 1.8 x 1.9 cm on the patient's CT from March 01, 2020). This lymph node does not appear to demonstrate a normal fatty hilum. Limitations: none IMPRESSION: 1. No DVT, however the calf veins were not well visualized on this study. 2. There is an abnormally enlarged lymph node in the left inguinal region measuring 3.6 x 1.7 x 3.3 cm (previously measuring 1.9 x 1.8  cm on the patient's prior CT). While this may  represent a reactive lymph node, a normal fatty hilum is not visualized. As such, underlying malignancy cannot be excluded. Short interval follow-up ultrasound or outpatient percutaneous biopsy is recommended. Electronically Signed   By: Katherine Mantle M.D.   On: 08/30/2020 18:23   DG Chest Port 1 View  Result Date: 08/30/2020 CLINICAL DATA:  Fever, recent food poisoning EXAM: PORTABLE CHEST 1 VIEW COMPARISON:  03/01/2020, 06/19/2019 FINDINGS: Single frontal view of the chest demonstrates stable enlarged cardiac silhouette. Stable enlargement of the aortic arch, with underlying stent, compatible with repair of prior thoracic aortic dissection. Progressive decrease in size of the aortic arch consistent with thrombosis of the false lumen. No acute airspace disease, effusion, or pneumothorax. No acute bony abnormalities. IMPRESSION: 1. 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 2. No acute intrathoracic process. Electronically Signed   By: Sharlet Salina M.D.   On: 08/30/2020 19:29        Scheduled Meds: . [START ON 09/01/2020] Chlorhexidine Gluconate Cloth  6 each Topical Q0600  . feeding supplement  237 mL Oral BID BM  . vancomycin variable dose per unstable renal function (pharmacist dosing)   Does not apply See admin instructions   Continuous Infusions: . amiodarone 30 mg/hr (08/31/20 1848)  . [START ON 09/01/2020] ceFEPime (MAXIPIME) IV    . [START ON 09/01/2020] metronidazole       LOS: 1 day    Time spent: 70 minutes    Alberteen Sam, MD [facility] 08/31/2020, 7:24 PM     Please page though AMION or Epic secure chat:  For [employer], Higher education careers adviser

## 2020-08-31 NOTE — Progress Notes (Addendum)
Pharmacy Antibiotic Note  Connor [name] is a 65 y.o. male admitted on 08/30/2020 with sepsis.  Pharmacy was consulted for vancomycin/cefepime dosing overnight last night (?source as cellulitis or intra-abdominal). Bld cx (3/4 btls) growing GPC in chains; BCID: strept species (not enterococcus, agalactiae, pyogenes or pneumoniae), so pt's antibiotics were transitioned to ceftriaxone this afternoon. Pharmacy is now consulted for vancomycin/cefepime dosing for possible necrotizing fasciitis.  WBC up to 18.3, Tmax 102.7 F; Scr 4.56, CrCl 21.4 ml/Connor (AKI, baseline appears to be 1.0-1.3 range); Scr has increased since admission (4.21>4.56), renal function unstable  Plan: Cefepime 2 gm IV Q 24 hrs (rec'd last dose at 0130 on 1/5, next dose to be scheduled at 0130 1/6) Pt rec'd vancomycin 1500 mg IV X 1 at 0008 on 1/5; renal function unstable and poor, so will dose vancomycin by levels until renal function more stable; check vancomycin random level now to evaluate vancomycin clearance in light of rising Scr; will also check Scr now to evaluate renal function trend Monitor WBC, temp, clinical improvement, cultures, renal function, vancomycin levels  Height: 5\' 11"  (180.3 cm) Weight: 114.8 kg (253 lb 1.4 oz) IBW/kg (Calculated) : 75.3  Temp (24hrs), Avg:100.5 F (38.1 C), Connor:98.7 F (37.1 C), Max:102.7 F (39.3 C)  Recent Labs  Lab 08/30/20 1947 08/30/20 2215 08/31/20 0154 08/31/20 0414 08/31/20 1912  WBC 15.6*  --   --  18.3*  --   CREATININE 4.21*  --   --  4.56* 5.77*  LATICACIDVEN 2.1* 2.4* 1.5 1.4  --   VANCORANDOM  --   --   --   --  15    Estimated Creatinine Clearance: 21.4 mL/Connor (A) (by C-G formula based on SCr of 4.56 mg/dL (H)).    No Known Allergies   Microbiology Results 1/4 Bld cx: 3/4 btls with strept species (not enterococcus, agalactiae, pyogenes or pneumoniae): ID pending 1/5 Urine cx: pending  Antimicrobial Therapy This Admission Ceftriaxone: 1/4, 1/5 Cefepime: 1/5  - Metronidazole IV: 1/5  Vancomycin: 1/5 -  Clindamycin: 1/5 -   10/29/20, PharmD, BCPS, [facility] Clinical Pharmacist 08/31/20, 18:10 PM  ADDENDUM: Vancomycin random level ~19 hrs after vancomycin 1500 mg IV X 1 dose was 15 mg/L. Scr has increased further to 5.77, CrCl is down to 16.9 ml/Connor; will dose vancomycin by levels, due to unstable/worsening renal function; give vancomycin 1500 mg IV X 1 now; check Scr with AM labs to trend renal function and determine when to check next vancomycin level (vancomycin half life with current level of renal dysfunction is estimated to be >35 hrs) Continue cefepime 2 gm IV Q 24 hrs Metronidazole has been changed to clindamycin for anaerobic and anti-toxin effect, due to suspected necrotizing fasciitis  10/29/20, PharmD, BCPS, FCCP 08/31/20, 20:06 PM

## 2020-09-01 ENCOUNTER — Inpatient Hospital Stay (HOSPITAL_COMMUNITY): Payer: Medicaid Other

## 2020-09-01 DIAGNOSIS — A409 Streptococcal sepsis, unspecified: Secondary | ICD-10-CM | POA: Diagnosis not present

## 2020-09-01 DIAGNOSIS — I480 Paroxysmal atrial fibrillation: Secondary | ICD-10-CM

## 2020-09-01 DIAGNOSIS — N183 Chronic kidney disease, stage 3 unspecified: Secondary | ICD-10-CM | POA: Diagnosis present

## 2020-09-01 DIAGNOSIS — L03116 Cellulitis of left lower limb: Secondary | ICD-10-CM | POA: Diagnosis not present

## 2020-09-01 DIAGNOSIS — R7881 Bacteremia: Secondary | ICD-10-CM | POA: Diagnosis not present

## 2020-09-01 DIAGNOSIS — N179 Acute kidney failure, unspecified: Secondary | ICD-10-CM | POA: Diagnosis not present

## 2020-09-01 LAB — COMPREHENSIVE METABOLIC PANEL
ALT: 54 U/L — ABNORMAL HIGH (ref 0–44)
AST: 193 U/L — ABNORMAL HIGH (ref 15–41)
Albumin: 2.2 g/dL — ABNORMAL LOW (ref 3.5–5.0)
Alkaline Phosphatase: 77 U/L (ref 38–126)
Anion gap: 15 (ref 5–15)
BUN: 84 mg/dL — ABNORMAL HIGH (ref 8–23)
CO2: 20 mmol/L — ABNORMAL LOW (ref 22–32)
Calcium: 7.1 mg/dL — ABNORMAL LOW (ref 8.9–10.3)
Chloride: 102 mmol/L (ref 98–111)
Creatinine, Ser: 7.17 mg/dL — ABNORMAL HIGH (ref 0.61–1.24)
GFR, Estimated: 8 mL/min — ABNORMAL LOW (ref >60)
Glucose, Bld: 101 mg/dL — ABNORMAL HIGH (ref 70–99)
Potassium: 3.3 mmol/L — ABNORMAL LOW (ref 3.5–5.1)
Sodium: 137 mmol/L (ref 135–145)
Total Bilirubin: 2.8 mg/dL — ABNORMAL HIGH (ref 0.3–1.2)
Total Protein: 6.1 g/dL — ABNORMAL LOW (ref 6.5–8.1)

## 2020-09-01 LAB — CBC
HCT: 29.2 % — ABNORMAL LOW (ref 39.0–52.0)
HCT: 25.3 % — ABNORMAL LOW (ref 39.0–52.0)
Hemoglobin: 9.1 g/dL — ABNORMAL LOW (ref 13.0–17.0)
Hemoglobin: 8.3 g/dL — ABNORMAL LOW (ref 13.0–17.0)
MCH: 30.7 pg (ref 26.0–34.0)
MCH: 30.1 pg (ref 26.0–34.0)
MCHC: 31.2 g/dL (ref 30.0–36.0)
MCHC: 32.8 g/dL (ref 30.0–36.0)
MCV: 98.6 fL (ref 80.0–100.0)
MCV: 91.7 fL (ref 80.0–100.0)
Platelets: 187 10*3/uL (ref 150–400)
Platelets: 206 10*3/uL (ref 150–400)
RBC: 2.96 MIL/uL — ABNORMAL LOW (ref 4.22–5.81)
RBC: 2.76 MIL/uL — ABNORMAL LOW (ref 4.22–5.81)
RDW: 15.3 % (ref 11.5–15.5)
RDW: 14.9 % (ref 11.5–15.5)
WBC: 21.4 10*3/uL — ABNORMAL HIGH (ref 4.0–10.5)
WBC: 22.2 10*3/uL — ABNORMAL HIGH (ref 4.0–10.5)
nRBC: 0.2 % (ref 0.0–0.2)
nRBC: 0 % (ref 0.0–0.2)

## 2020-09-01 LAB — BASIC METABOLIC PANEL
Anion gap: 17 — ABNORMAL HIGH (ref 5–15)
BUN: 78 mg/dL — ABNORMAL HIGH (ref 8–23)
CO2: 16 mmol/L — ABNORMAL LOW (ref 22–32)
Calcium: 7 mg/dL — ABNORMAL LOW (ref 8.9–10.3)
Chloride: 102 mmol/L (ref 98–111)
Creatinine, Ser: 6.51 mg/dL — ABNORMAL HIGH (ref 0.61–1.24)
GFR, Estimated: 9 mL/min — ABNORMAL LOW (ref >60)
Glucose, Bld: 104 mg/dL — ABNORMAL HIGH (ref 70–99)
Potassium: 3.9 mmol/L (ref 3.5–5.1)
Sodium: 135 mmol/L (ref 135–145)

## 2020-09-01 LAB — HEPATIC FUNCTION PANEL
ALT: 56 U/L — ABNORMAL HIGH (ref 0–44)
AST: 220 U/L — ABNORMAL HIGH (ref 15–41)
Albumin: 2.5 g/dL — ABNORMAL LOW (ref 3.5–5.0)
Alkaline Phosphatase: 75 U/L (ref 38–126)
Bilirubin, Direct: 2 mg/dL — ABNORMAL HIGH (ref 0.0–0.2)
Indirect Bilirubin: 1.2 mg/dL — ABNORMAL HIGH (ref 0.3–0.9)
Total Bilirubin: 3.2 mg/dL — ABNORMAL HIGH (ref 0.3–1.2)
Total Protein: 6.3 g/dL — ABNORMAL LOW (ref 6.5–8.1)

## 2020-09-01 LAB — ECHOCARDIOGRAM COMPLETE
Area-P 1/2: 3.05 cm2
Height: 71 in
S' Lateral: 2.9 cm
Weight: 4049.41 oz

## 2020-09-01 LAB — CK: Total CK: 24138 U/L — ABNORMAL HIGH (ref 49–397)

## 2020-09-01 LAB — LACTIC ACID, PLASMA: Lactic Acid, Venous: 0.9 mmol/L (ref 0.5–1.9)

## 2020-09-01 LAB — PROCALCITONIN: Procalcitonin: 43.41 ng/mL

## 2020-09-01 MED ORDER — LACTATED RINGERS IV SOLN
INTRAVENOUS | Status: DC
  Administered 2020-09-04: 50 mL via INTRAVENOUS

## 2020-09-01 MED ORDER — SODIUM CHLORIDE 0.9 % IV SOLN: INTRAVENOUS | Status: DC

## 2020-09-01 MED ORDER — ORAL CARE MOUTH RINSE
15.0000 mL | Freq: Two times a day (BID) | OROMUCOSAL | Status: DC
  Administered 2020-09-01 – 2020-09-19 (×27): 15 mL via OROMUCOSAL

## 2020-09-01 MED ORDER — SODIUM CHLORIDE 0.9 % IV BOLUS
500.0000 mL | Freq: Once | INTRAVENOUS | Status: AC
  Administered 2020-09-01: 500 mL via INTRAVENOUS

## 2020-09-01 NOTE — Consult Note (Addendum)
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 Will switch fluids from NS to LR -abdominal ultrasound without obstruction -would recommend having a low threshold to transfer to [facility] for possible CRRT. Fortunately, no indications of renal replacement therapy at this time -Continue to monitor daily Cr, Dose meds for GFR<15 -Monitor Daily I/Os, Daily weight  -Maintain MAP>65 for optimal renal perfusion.  -Avoid nephrotoxic medications including NSAIDs and iodinated intravenous contrast exposure unless the latter is absolutely indicated.  Preferred narcotic agents for pain control are hydromorphone, fentanyl, and methadone. Morphine should not be used. Avoid Baclofen and avoid oral sodium phosphate and magnesium citrate based laxatives / bowel preps. Continue strict Input and Output monitoring. Will monitor the patient closely with you and intervene or adjust therapy as indicated by changes in clinical status/labs   Severe sepsis -positive bcx for gpc x 2 (streptococcus) -positive ucx for e faecalis -likely related to LLE cellulitis and UTI, abx per primary service. Recommend watching vanc troughs closely  Rhabdomyolysis -no evidence of compartment synd -continue with fluids as above, trend CPK  Hypotension -likely related to sepsis -home anti-hypertensives held -keep MAP>65  Metabolic acidosis, anion gap -lactate improved, now more so from AKI -fluids changed to LR -if bicarb worsens, please check blood gas. If  pH<7.2 would recommend starting bicarb infusion  Chronic hypokalemia. 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 Was supposed to see endocrinology for this. Would be cautious with repletion if needed.  Normocytic Anemia -Transfuse for Hgb<7 g/dL. Avoid iron in the setting of bacteremia/acute infection  Recommendations conveyed to primary service.    [name] [facility] 09/01/2020 12:12 PM   _____________________________________________________________________________________   History of Present Illness: Connor [name] is a/an 65 y.o. 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 He also developed fevers, pain/swelling of his legs. Found to be septic likely from left leg cellulitis. Has relative hypotension (of note, has a history of hard to control HTN despite being on numerous agents) with systolics in the 90's. Typically has a baseline Cr around 1-1.3, however Cr on presentation was 4.2 along with hypokalemia, leukocytosis, lactic acidosis, rhabdo (elevated CPK: 28,083). Was hospitalized from 2/27-3/16/2020 for recurrent aortic root repair, his hospital course was complicated by cardiogenic shock and AKI requiring temporary CRRT, but fortunately had good renal recovery. Was seen by [facility]) back in 01/2019. Prior coming to the hospital, patient does report taking NSAIDs about every other day given his pain. Other than pain in his LLE, patient denies chest pain, worsening SOB, dizziness, n/v, dysgeusia, brain fog, hiccups, pruritis, tremors/jerks He does have diminished appetite, but he reports this is ongoing since he felt ill.  Medications:  Current Facility-Administered Medications  Medication Dose Route  Frequency Provider Last Rate Last Admin  . 0.9 %  sodium chloride infusion   Intravenous Continuous Hollice Espy, MD  125 mL/hr at 09/01/20 1130 New Bag at 09/01/20 1130  . amiodarone (NEXTERONE PREMIX) 360-4.14 MG/200ML-% (1.8 mg/mL) IV infusion  30 mg/hr Intravenous Continuous Adefeso, Oladapo, DO 16.67 mL/hr at 09/01/20 0612 30 mg/hr at 09/01/20 0612  . aspirin EC tablet 81 mg  81 mg Oral Daily Edwin Dada, MD   81 mg at 09/01/20 0930  . ceFEPIme (MAXIPIME) 2 g in sodium chloride 0.9 % 100 mL IVPB  2 g Intravenous Q24H Edwin Dada, MD   Stopped at 09/01/20 0247  . Chlorhexidine Gluconate Cloth 2 % PADS 6 each  6 each Topical Q0600 Edwin Dada, MD   6 each at 09/01/20 0547  . feeding supplement (ENSURE ENLIVE / ENSURE PLUS) liquid 237 mL  237 mL Oral BID BM Adefeso, Oladapo, DO   237 mL at 08/31/20 1400  . HYDROmorphone (DILAUDID) injection 0.5 mg  0.5 mg Intravenous Q4H PRN Edwin Dada, MD   0.5 mg at 09/01/20 0826  . MEDLINE mouth rinse  15 mL Mouth Rinse BID Danford, Suann Larry, MD   15 mL at 09/01/20 0930  . ondansetron (ZOFRAN) injection 4 mg  4 mg Intravenous Q6H PRN Adefeso, Oladapo, DO      . oxyCODONE (Oxy IR/ROXICODONE) immediate release tablet 5 mg  5 mg Oral Q4H PRN Danford, Suann Larry, MD   5 mg at 09/01/20 0930     ALLERGIES Patient has no known allergies.  MEDICAL HISTORY Past Medical History:  Diagnosis Date  . AAA (abdominal aortic aneurysm) ([place])   . History of open heart surgery   . Hypertension   . Seroma due to trauma [facility])      SOCIAL HISTORY Social History   Socioeconomic History  . Marital status: Single    Spouse name: Not on file  . Number of children: Not on file  . Years of education: Not on file  . Highest education level: Not on file  Occupational History  . Not on file  Tobacco Use  . Smoking status: Current Every Day Smoker    Packs/day: 0.50  . Smokeless tobacco: Never Used  Vaping Use   . Vaping Use: Never used  Substance and Sexual Activity  . Alcohol use: Never  . Drug use: Never  . Sexual activity: Not on file  Other Topics Concern  . Not on file  Social History Narrative  . Not on file   Social Determinants of Health   Financial Resource Strain: Not on file  Food Insecurity: Not on file  Transportation Needs: Not on file  Physical Activity: Not on file  Stress: Not on file  Social Connections: Not on file  Intimate Partner Violence: Not on file     FAMILY HISTORY No family history on file.   Review of Systems: 12 systems reviewed Otherwise as per HPI, all other systems reviewed and negative  Physical Exam: Vitals:   09/01/20 1000 09/01/20 1129  BP: 93/60   Pulse: 81   Resp: 20   Temp:  98.3 F (36.8 C)  SpO2: 97%    No intake/output data recorded.  Intake/Output Summary (Last 24 hours) at 09/01/2020 1212 Last data filed at 09/01/2020 4431 Gross per 24 hour  Intake 3402.63 ml  Output 200 ml  Net 3202.63 ml   General: well-appearing, in minimal distress due to pain HEENT: anicteric sclera, oropharynx clear without lesions CV: irreg irreg, normal rhythm, no murmurs, no gallops, no rubs Lungs: clear to auscultation bilaterally, normal work of breathing  Abd: soft, non-tender, non-distended Skin: no visible lesions or rashes Psych: alert, engaged, appropriate mood and affect Musculoskeletal: left leg nonpitting edema throughout, no significant edema RLE Neuro: normal speech, no gross focal deficits  Test Results Reviewed Lab Results  Component Value Date   NA 135 09/01/2020   K 3.9 09/01/2020   CL 102 09/01/2020   CO2 16 (L) 09/01/2020   BUN 78 (H) 09/01/2020   CREATININE 6.51 (H) 09/01/2020   CALCIUM 7.0 (L) 09/01/2020   ALBUMIN 2.5 (L) 09/01/2020   PHOS 3.5 08/31/2020     I have reviewed all relevant outside healthcare records related to the patient's kidney injury.

## 2020-09-01 NOTE — Progress Notes (Signed)
*  PRELIMINARY RESULTS* Echocardiogram 2D Echocardiogram has been performed.  Stacey Drain 09/01/2020, 1:48 PM

## 2020-09-01 NOTE — Progress Notes (Signed)
Pt returned from MRI. Amio gtt re-started. Pt c/o 9/10 leg pain. Medication given. Will continue to monitor.

## 2020-09-01 NOTE — Progress Notes (Signed)
PROGRESS NOTE  Connor SequinRobert Greeno WUJ:[id number] DOB: [birth year] DOA: 08/30/2020 PCP: Patient, No Pcp Per  HPI/Recap of past 7024 hours: 65 year old male with past medical history of hypertension, morbid obesity, stage II-3 chronic kidney disease, atrial fibrillation on Eliquis and history of ascending aortic dissection status post open repair in 2016 with redo in 2019 as well as stenting to left common and external iliac in 2020 with course complicated by cardiogenic shock and acute kidney injury requiring short-term dialysis presented to the emergency room on 1/4 with 1 week of nausea/vomiting/chills.  Initially he attributed this to a gastroenteritis.  After few days, started having left leg pain and swelling to the point where he came to the emergency room.  In the emergency room, he was found to be in severe sepsis with a white count of 15, lactate of 2 and creatinine of 4.2 with a baseline at 1.3.  His potassium was 2.8.  Patient started on IV fluids and antibiotics and admitted for cellulitis.  Ultrasound ruled out DVT.  Seen by orthopedic surgery and CT scan noted soft tissue swelling but no muscle damage and no signs of abscess although limited due to lack of contrast and CT due to renal function.  Patient also noted to have rhabdomyolysis after urinalysis noting strong heme but few RBCs and CK greater than 28,000.  Rhabdo felt to be infectious.  Patient was given 4 L of fluid with little urine output and so fluids were stopped on 1/5.  Today, patient looks worse.  White count up to 21.4 and procalcitonin level at 43.41 although lactic acid level now normalized 0.9.  CK slightly down to 24,000 but creatinine up to 6.5.  Nephrology consulted who felt that acute kidney injury likely secondary to sepsis and rhabdomyolysis.  Fluids were restarted after fluid bolus.  Afternoon labs are pending.  Urinalysis grew out only 8000 colonies of possible Enterococcus, but multiple blood cultures grew out strep G,  consistent with his cellulitis.Patient still feels weak and tired complaining of right leg pain.    Assessment/Plan: Principal Problem:   Severe Streptococcus sepsis with acute organ dysfunction due to left lower extremity cellulitis: Patient meets criteria for severe sepsis on admission secondary to lower extremity cellulitis given tachycardia, tachypnea, leukocytosis with cellulitis source and lactic acidosis.  With lactic acidosis better, his sepsis may be more stabilizing.  White count may be worsening due to declining renal function which may be more of a byproduct of rhabdomyolysis.  Nevertheless, continue aggressive IV fluids and antibiotics.  Urine culture only growing 1000 colonies seems irrelevant.  Continue cefepime and waiting sensitivities.  No signs of compartment syndrome or DVT.  MRI done 1/6 notes mild diffuse subcutaneous soft tissue swelling and edema suggesting cellulitis and changes of myofascitis involving medial compartment muscles but no evidence of pyomyositis, septic arthritis, osteomyelitis and some stable and enlarged lymph nodes. Active Problems:   Persistent atrial fibrillation (HCC): Stable and rate controlled.  On amiodarone    Hypertension: Borderline hypotension although better with fluids.    Elevated troponin: Mildly elevated, likely more in the setting of renal disease.  No signs of ACS.    Thrombocytopenia (HCC) and elevated total bilirubin: Signs of sepsis.  Transaminitis may be more from hypotension and shock liver   Hypokalemia: Replace as needed    Hyperglycemia: Mildly elevated.  No previous history of diabetes, secondary covering with sliding scale.  Will check an A1c    Nausea vomiting and diarrhea Acute kidney injury in the  setting of CKD (chronic kidney disease), stage III (HCC) secondary to rhabdomyolysis: Worsening.  Restarted IV fluids and continue to monitor.  Hopefully will improve nephrology following.    Morbid obesity (HCC): Meets criteria  in the context of BMI greater than 35+ hypertension, CAD   Code Status: Full code  Family Communication: Left message for family  Disposition Plan: Continue in stepdown.  If renal function continues to decline, [facility] or potential dialysis   Consultants:  Orthopedic surgery  Nephrology  Procedures:  Echocardiogram done 1/6: Results pending  Lower extremity Dopplers done 1/5, no evidence of DVT  Antimicrobials:  IV vancomycin 1/4-1/6  IV cefepime 1/4-present  IV clindamycin 1/4-1/6  DVT prophylaxis: SCDs  I have spent 50 minutes in the care of this critically ill patient including discussion with specialist, bedside examination, interpretation of patient's labs and radiology studies and medical decision making.  Objective: Vitals:   09/01/20 1200 09/01/20 1230  BP:    Pulse: 80 83  Resp: 18 (!) 22  Temp:    SpO2: 97% 96%    Intake/Output Summary (Last 24 hours) at 09/01/2020 1436 Last data filed at 09/01/2020 0612 Gross per 24 hour  Intake 3402.63 ml  Output 200 ml  Net 3202.63 ml   Filed Weights   08/30/20 1814 08/31/20 1156 09/01/20 0540  Weight: 112.9 kg 114.8 kg 114.8 kg   Body mass index is 35.3 kg/m.  Exam:   General: Somnolent, fatigued, oriented x2  HEENT: Normocephalic and atraumatic, neck is thick, narrow airway, mucous membranes are dry  Cardiovascular: Regular rate, and rhythm, occasional ectopic beat  Respiratory: Clear to auscultation bilaterally  Abdomen: Soft, nontender, nondistended, positive bowel sounds  Musculoskeletal: No clubbing or cyanosis.  Left lower extremity is edematous, 2+ and warm  Skin: Mild erythema of the left lower extremity below the knee  Psychiatry: Appropriate, no evidence of psychoses   Data Reviewed: CBC: Recent Labs  Lab 08/30/20 1947 08/31/20 0414 09/01/20 0532  WBC 15.6* 18.3* 21.4*  NEUTROABS 13.3*  --   --   HGB 10.5* 10.1* 9.1*  HCT 31.7* 31.0* 29.2*  MCV  91.1 92.5 98.6  PLT 136* 137* 187   Basic Metabolic Panel: Recent Labs  Lab 08/30/20 1947 08/31/20 0414 08/31/20 1912 09/01/20 0532  NA 137 134*  --  135  K 2.8* 3.3*  --  3.9  CL 103 102  --  102  CO2 22 20*  --  16*  GLUCOSE 162* 143*  --  104*  BUN 55* 54*  --  78*  CREATININE 4.21* 4.56* 5.77* 6.51*  CALCIUM 7.8* 7.3*  --  7.0*  MG  --  2.0  --   --   PHOS  --  3.5  --   --    GFR: Estimated Creatinine Clearance: 15 mL/min (A) (by C-G formula based on SCr of 6.51 mg/dL (H)). Liver Function Tests: Recent Labs  Lab 08/30/20 1947 08/31/20 0414 09/01/20 0532  AST 279* 274* 220*  ALT 60* 58* 56*  ALKPHOS 69 59 75  BILITOT 3.0* 4.1* 3.2*  PROT 7.2 6.5 6.3*  ALBUMIN 2.8* 2.6* 2.5*   No results for input(s): LIPASE, AMYLASE in the last 168 hours. No results for input(s): AMMONIA in the last 168 hours. Coagulation Profile: Recent Labs  Lab 08/30/20 1947 08/31/20 0414  INR 1.5* 2.0*   Cardiac Enzymes: Recent Labs  Lab 08/31/20 1418 09/01/20 0532  CKTOTAL 28,083* 24,138*   BNP (last 3  results) Recent Labs    09/08/19 1645  PROBNP 719*   HbA1C: No results for input(s): HGBA1C in the last 72 hours. CBG: Recent Labs  Lab 08/30/20 1940  GLUCAP 163*   Lipid Profile: No results for input(s): CHOL, HDL, LDLCALC, TRIG, CHOLHDL, LDLDIRECT in the last 72 hours. Thyroid Function Tests: No results for input(s): TSH, T4TOTAL, FREET4, T3FREE, THYROIDAB in the last 72 hours. Anemia Panel: No results for input(s): VITAMINB12, FOLATE, FERRITIN, TIBC, IRON, RETICCTPCT in the last 72 hours. Urine analysis:    Component Value Date/Time   COLORURINE AMBER (A) 08/31/2020 0130   APPEARANCEUR CLOUDY (A) 08/31/2020 0130   LABSPEC 1.017 08/31/2020 0130   PHURINE 5.0 08/31/2020 0130   GLUCOSEU NEGATIVE 08/31/2020 0130   HGBUR LARGE (A) 08/31/2020 0130   BILIRUBINUR NEGATIVE 08/31/2020 0130   KETONESUR NEGATIVE 08/31/2020 0130   PROTEINUR 100 (A) 08/31/2020 0130    NITRITE NEGATIVE 08/31/2020 0130   LEUKOCYTESUR NEGATIVE 08/31/2020 0130   Sepsis Labs: @LABRCNTIP (procalcitonin:4,lacticidven:4)  ) Recent Results (from the past 240 hour(s))  Blood Culture (routine x 2)     Status: Abnormal (Preliminary result)   Collection Time: 08/30/20  7:34 PM   Specimen: BLOOD  Result Value Ref Range Status   Specimen Description   Final    BLOOD BLOOD LEFT ARM Performed at [facility], [address] [place], [place]    Special Requests   Final    BOTTLES DRAWN AEROBIC AND ANAEROBIC Blood Culture adequate volume Performed at [facility], [address], [place], [place]    Culture  Setup Time   Final    GRAM POSITIVE COCCI IN CHAINS ANAEROBIC BOTTLE ONLY Gram Stain Report Called to,Read Back By and Verified With: MICHAEL DOSS @0905  08/31/20 BY JONES,T [facility] CORRECTED RESULTS PREVIOUSLY REPORTED AS: GRAM POSITIVE RODS CORRECTED RESULTS CALLED TO: S HURTH PHARMD @1409  08/31/20 EB    Culture (A)  Final    STREPTOCOCCUS GROUP G SUSCEPTIBILITIES TO FOLLOW Performed at [facility], [address] [place], [facility] [address]    Report Status PENDING  Incomplete  Blood Culture (routine x 2)     Status: Abnormal (Preliminary result)   Collection Time: 08/30/20  7:35 PM   Specimen: BLOOD RIGHT ARM  Result Value Ref Range Status   Specimen Description   Final    BLOOD RIGHT ARM Performed at [facility], [address] [place], [facility] [address]    Special Requests   Final    BOTTLES DRAWN AEROBIC AND ANAEROBIC Blood Culture adequate volume Performed at [facility], [address] [place], [facility] [address]    Culture  Setup Time   Final    GRAM POSITIVE COCCI IN CHAINS IN BOTH AEROBIC AND ANAEROBIC BOTTLES Gram Stain Report Called to,Read Back By and Verified With: MICHAEL DOSS @0905  08/31/20 BY JONES,T [facility] Organism ID to follow CORRECTED RESULTS PREVIOUSLY REPORTED AS: GRAM POSITIVE RODS CORRECTED RESULTS CALLED TO: S HURTH  PHARMD @1409  08/31/20 EB Performed at [facility], [address] [place], 10/29/20    Culture STREPTOCOCCUS GROUP G (A)  Final   Report Status PENDING  Incomplete  Resp Panel by RT-PCR (Flu A&B, Covid) Nasopharyngeal Swab     Status: None   Collection Time: 08/30/20  7:35 PM   Specimen: Nasopharyngeal Swab; Nasopharyngeal(NP) swabs in vial transport medium  Result Value Ref Range Status   SARS Coronavirus 2 by RT PCR NEGATIVE NEGATIVE Final    Comment: (  NOTE) SARS-CoV-2 target nucleic acids are NOT DETECTED.  The SARS-CoV-2 RNA is generally detectable in upper respiratory specimens during the acute phase of infection. The lowest concentration of SARS-CoV-2 viral copies this assay can detect is 138 copies/mL. A negative result does not preclude SARS-Cov-2 infection and should not be used as the sole basis for treatment or other patient management decisions. A negative result may occur with  improper specimen collection/handling, submission of specimen other than nasopharyngeal swab, presence of viral mutation(s) within the areas targeted by this assay, and inadequate number of viral copies(<138 copies/mL). A negative result must be combined with clinical observations, patient history, and epidemiological information. The expected result is Negative.  Fact Sheet for Patients:  BloggerCourse.com  Fact Sheet for Healthcare Providers:  SeriousBroker.it  This test is no t yet approved or cleared by the [place] FDA and  has been authorized for detection and/or diagnosis of SARS-CoV-2 by FDA under an Emergency Use Authorization (EUA). This EUA will remain  in effect (meaning this test can be used) for the duration of the COVID-19 declaration under Section 564(b)(1) of the Act, 21 U.S.C.section 360bbb-3(b)(1), unless the authorization is terminated  or revoked sooner.       Influenza A by PCR NEGATIVE NEGATIVE  Final   Influenza B by PCR NEGATIVE NEGATIVE Final    Comment: (NOTE) The Xpert Xpress SARS-CoV-2/FLU/RSV plus assay is intended as an aid in the diagnosis of influenza from Nasopharyngeal swab specimens and should not be used as a sole basis for treatment. Nasal washings and aspirates are unacceptable for Xpert Xpress SARS-CoV-2/FLU/RSV testing.  Fact Sheet for Patients: BloggerCourse.com  Fact Sheet for Healthcare Providers: SeriousBroker.it  This test is not yet approved or cleared by the [place] FDA and has been authorized for detection and/or diagnosis of SARS-CoV-2 by FDA under an Emergency Use Authorization (EUA). This EUA will remain in effect (meaning this test can be used) for the duration of the COVID-19 declaration under Section 564(b)(1) of the Act, 21 U.S.C. section 360bbb-3(b)(1), unless the authorization is terminated or revoked.  Performed at [facility], [address] [place], [place] [address]   Blood Culture ID Panel (Reflexed)     Status: Abnormal   Collection Time: 08/30/20  7:35 PM  Result Value Ref Range Status   Enterococcus faecalis NOT DETECTED NOT DETECTED Final   Enterococcus Faecium NOT DETECTED NOT DETECTED Final   Listeria monocytogenes NOT DETECTED NOT DETECTED Final   Staphylococcus species NOT DETECTED NOT DETECTED Final   Staphylococcus aureus (BCID) NOT DETECTED NOT DETECTED Final   Staphylococcus epidermidis NOT DETECTED NOT DETECTED Final   Staphylococcus lugdunensis NOT DETECTED NOT DETECTED Final   Streptococcus species DETECTED (A) NOT DETECTED Final    Comment: Not Enterococcus species, Streptococcus agalactiae, Streptococcus pyogenes, or Streptococcus pneumoniae. CRITICAL RESULT CALLED TO, READ BACK BY AND VERIFIED WITH: G. Coffee PharmD 16:30 08/31/20 ([email or web address])    Streptococcus agalactiae NOT DETECTED NOT DETECTED Final   Streptococcus pneumoniae NOT DETECTED NOT DETECTED Final    Streptococcus pyogenes NOT DETECTED NOT DETECTED Final   A.calcoaceticus-baumannii NOT DETECTED NOT DETECTED Final   Bacteroides fragilis NOT DETECTED NOT DETECTED Final   Enterobacterales NOT DETECTED NOT DETECTED Final   Enterobacter cloacae complex NOT DETECTED NOT DETECTED Final   Escherichia coli NOT DETECTED NOT DETECTED Final   Klebsiella aerogenes NOT DETECTED NOT DETECTED Final   Klebsiella oxytoca NOT DETECTED NOT DETECTED Final   Klebsiella pneumoniae NOT DETECTED NOT DETECTED Final   Proteus  species NOT DETECTED NOT DETECTED Final   Salmonella species NOT DETECTED NOT DETECTED Final   Serratia marcescens NOT DETECTED NOT DETECTED Final   Haemophilus influenzae NOT DETECTED NOT DETECTED Final   Neisseria meningitidis NOT DETECTED NOT DETECTED Final   Pseudomonas aeruginosa NOT DETECTED NOT DETECTED Final   Stenotrophomonas maltophilia NOT DETECTED NOT DETECTED Final   Candida albicans NOT DETECTED NOT DETECTED Final   Candida auris NOT DETECTED NOT DETECTED Final   Candida glabrata NOT DETECTED NOT DETECTED Final   Candida krusei NOT DETECTED NOT DETECTED Final   Candida parapsilosis NOT DETECTED NOT DETECTED Final   Candida tropicalis NOT DETECTED NOT DETECTED Final   Cryptococcus neoformans/gattii NOT DETECTED NOT DETECTED Final    Comment: Performed at [facility], [place] [address] [place], [place] [address]  Urine culture     Status: Abnormal (Preliminary result)   Collection Time: 08/31/20  1:30 AM   Specimen: In/Out Cath Urine  Result Value Ref Range Status   Specimen Description   Final    IN/OUT CATH URINE Performed at [facility], [address], [place], [place] [address]    Special Requests   Final    NONE Performed at [facility], [address] [place], [place] [address]    Culture (A)  Final    1,000 COLONIES/mL ENTEROCOCCUS FAECALIS SUSCEPTIBILITIES TO FOLLOW Performed at [facility], [place] [address], [place], [place] [address]     Report Status PENDING  Incomplete  MRSA PCR Screening     Status: None   Collection Time: 08/31/20 11:36 AM   Specimen: Nasal Mucosa; Nasopharyngeal  Result Value Ref Range Status   MRSA by PCR NEGATIVE NEGATIVE Final    Comment:        The GeneXpert MRSA Assay (FDA approved for NASAL specimens only), is one component of a comprehensive MRSA colonization surveillance program. It is not intended to diagnose MRSA infection nor to guide or monitor treatment for MRSA infections. Performed at [facility], [address] [place], [place] [address]       Studies: US Abdomen Complete  Result Date: 09/01/2020 CLINICAL DATA:  Elevated liver enzymes. History of aortic aneurysm and dissection. EXAM: ABDOMEN ULTRASOUND COMPLETE COMPARISON:  None. FINDINGS: Gallbladder: Within the gallbladder, there are echogenic foci which move and shadow consistent with cholelithiasis. Largest gallstone measures 5 mm in length. There is no appreciable gallbladder wall thickening or pericholecystic fluid. No sonographic Murphy sign noted by sonographer. Common bile duct: Diameter: 8 mm proximally, prominent. There is slight intrahepatic biliary duct dilatation. No biliary duct obstructing lesion is evident. Note that portions of the mid to distal common bile duct are obscured by gas. Liver: No focal lesion identified. Liver echogenicity overall is increased. Portal vein is patent on color Doppler imaging with normal direction of blood flow towards the liver. IVC: No abnormality visualized in portions that can be interrogated. Note that portions of the inferior vena cava obscured by gas. Pancreas: Most of the pancreas is obscured by gas. Spleen: Size and appearance within normal limits. Right Kidney: Length: 12.1 cm. Echogenicity within normal limits. No mass or hydronephrosis visualized. Left Kidney: Length: 12.8 cm. Echogenicity within normal limits. No mass or hydronephrosis visualized. Abdominal aorta: Proximal  abdominal aorta measures 5.0 cm in diameter, stable from prior CT examination. Most of the remainder of the aorta is obscured by gas. The known dissection in this area is not appreciable on current sonographic evaluation. Other findings: No demonstrable ascites.  IMPRESSION: 1. Cholelithiasis. No gallbladder wall thickening or pericholecystic fluid. 2. Prominence of the proximal common bile duct as well as mild intrahepatic biliary duct dilatation. No obstructing focus seen in the biliary ductal system. Note that portions of the mid to distal common bile duct obscured by gas. From an imaging standpoint, MRCP would be the optimum study of choice to further evaluate the biliary ductal system. 3. Proximal abdominal aortic aneurysm measuring approximately 5 cm in diameter, stable from prior CT examination. Most of the aorta is obscured by gas. The known dissection in this area of the aorta not appreciable by ultrasound. 4.  Most of pancreas and much of inferior vena cava obscured by gas. 5. Increase in liver echogenicity which may indicate hepatic steatosis with potential underlying parenchymal liver disease. No focal liver lesions are demonstrable on this study. Electronically Signed   By: Bretta Bang III M.D.   On: 09/01/2020 10:49   MR FEMUR LEFT WO CONTRAST  Result Date: 09/01/2020 CLINICAL DATA:  Left lower extremity pain and swelling. EXAM: MR OF THE LEFT FEMUR WITHOUT CONTRAST TECHNIQUE: Multiplanar, multisequence MR imaging of the left lower extremity was performed. No intravenous contrast was administered. COMPARISON:  CT scan 08/31/2020 FINDINGS: Both hips are normally located. Mild degenerative changes but no findings suspicious for septic arthritis or osteomyelitis. The left femur is intact. No evidence of osteomyelitis. Mild diffuse subcutaneous soft tissue swelling/edema/fluid suggesting cellulitis. Again demonstrated are enlarged and inflamed appearing left inguinal lymph nodes, likely a lymph  adenitis. No discrete drainable subcutaneous soft tissue abscess is identified although study somewhat limited without IV contrast. Changes of myofasciitis mainly involving the medial compartment/adductor compartment muscles. No findings suspicious for pyomyositis. Similar findings involving the right upper thigh. Stable postoperative changes involving the left inguinal area with scar tissue. The major vascular structures demonstrate patent flow voids. Stable borderline enlarged left external iliac lymph node measuring 15.5 mm. No significant intrapelvic abnormalities are identified. IMPRESSION: 1. Mild diffuse subcutaneous soft tissue swelling/edema/fluid suggesting cellulitis. 2. Changes of myofasciitis mainly involving the medial compartment/adductor compartment muscles. No evidence of pyomyositis. 3. No findings suspicious for septic arthritis or osteomyelitis. 4. Stable enlarged and inflamed appearing left inguinal and left external iliac lymph nodes. Electronically Signed   By: Rudie Meyer M.D.   On: 09/01/2020 09:38   CT EXTREMITY LOWER LEFT WO CONTRAST  Result Date: 08/31/2020 CLINICAL DATA:  Edema and swelling over left lower extremity EXAM: CT OF THE LOWER LEFT EXTREMITY WITHOUT CONTRAST TECHNIQUE: Multidetector CT imaging of the lower left extremity was performed according to the standard protocol. COMPARISON:  Ultrasound 08/30/2020, CT angiography 03/01/2020, 06/19/2019 FINDINGS: Bones/Joint/Cartilage No fracture or malalignment. No periostitis or bony destructive change. No significant hip or knee effusion is visualized. Ligaments Suboptimally assessed by CT. Muscles and Tendons No significant atrophy.  No intramuscular fluid collections. Soft tissues Mild vascular calcifications. Focal distortion within the left groin with surgical clips, unchanged and presumably due to surgical scarring. Multiple enlarged left inguinal lymph nodes, the largest measures 3.01 2.7 by 2.5 cm and corresponds to the  ultrasound demonstrated abnormal lymph node. Moderate subcutaneous edema and fluid within the left lower extremity extending from the hip to the imaged proximal lower leg. Edema most heavily concentrated at the distal thigh and knee. Skin thickening and edematous infiltration of subcutaneous fat of the anterior, medial, and lateral thigh with small amount of fluid superficial to the lateral quadriceps muscles. No gas containing fluid collections. IMPRESSION: 1. Moderate subcutaneous edema within the  left lower extremity extending from the hip to the imaged proximal lower leg, possible cellulitis or nonspecific lower extremity edema. No gas containing fluid collections are seen to suggest soft tissue abscess. Note that assessment for abscess is limited without contrast. There is no acute osseous abnormality. 2. Multiple enlarged left inguinal lymph nodes, the largest measuring up to 3 cm, corresponding to the ultrasound demonstrated abnormal lymph node on recent ultrasound. Findings are indeterminate for reactive adenopathy or malignant adenopathy. Tissue sampling was previously suggested. 3. No acute osseous abnormality. Electronically Signed   By: Jasmine Pang M.D.   On: 08/31/2020 21:18    Scheduled Meds: . aspirin EC  81 mg Oral Daily  . Chlorhexidine Gluconate Cloth  6 each Topical Q0600  . feeding supplement  237 mL Oral BID BM  . mouth rinse  15 mL Mouth Rinse BID    Continuous Infusions: . amiodarone 30 mg/hr (09/01/20 0612)  . ceFEPime (MAXIPIME) IV Stopped (09/01/20 0247)  . lactated ringers 125 mL/hr at 09/01/20 1311     LOS: 2 days     Hollice Espy, MD [facility]   09/01/2020, 2:36 PM

## 2020-09-01 NOTE — Progress Notes (Signed)
Amio paused while patient goes down for MRI of left leg. MRI staff present to transport patient. Will continue to monitor and re-start medication as soon as patient returns.

## 2020-09-02 DIAGNOSIS — N1831 Chronic kidney disease, stage 3a: Secondary | ICD-10-CM

## 2020-09-02 DIAGNOSIS — L03116 Cellulitis of left lower limb: Secondary | ICD-10-CM | POA: Diagnosis not present

## 2020-09-02 DIAGNOSIS — A409 Streptococcal sepsis, unspecified: Secondary | ICD-10-CM | POA: Diagnosis not present

## 2020-09-02 DIAGNOSIS — N179 Acute kidney failure, unspecified: Secondary | ICD-10-CM | POA: Diagnosis not present

## 2020-09-02 LAB — CULTURE, BLOOD (ROUTINE X 2)
Special Requests: ADEQUATE
Special Requests: ADEQUATE

## 2020-09-02 LAB — COMPREHENSIVE METABOLIC PANEL
ALT: 54 U/L — ABNORMAL HIGH (ref 0–44)
AST: 173 U/L — ABNORMAL HIGH (ref 15–41)
Albumin: 2.3 g/dL — ABNORMAL LOW (ref 3.5–5.0)
Alkaline Phosphatase: 84 U/L (ref 38–126)
Anion gap: 17 — ABNORMAL HIGH (ref 5–15)
BUN: 91 mg/dL — ABNORMAL HIGH (ref 8–23)
CO2: 18 mmol/L — ABNORMAL LOW (ref 22–32)
Calcium: 7 mg/dL — ABNORMAL LOW (ref 8.9–10.3)
Chloride: 103 mmol/L (ref 98–111)
Creatinine, Ser: 7.61 mg/dL — ABNORMAL HIGH (ref 0.61–1.24)
GFR, Estimated: 7 mL/min — ABNORMAL LOW (ref >60)
Glucose, Bld: 106 mg/dL — ABNORMAL HIGH (ref 70–99)
Potassium: 3.4 mmol/L — ABNORMAL LOW (ref 3.5–5.1)
Sodium: 138 mmol/L (ref 135–145)
Total Bilirubin: 2.3 mg/dL — ABNORMAL HIGH (ref 0.3–1.2)
Total Protein: 6.4 g/dL — ABNORMAL LOW (ref 6.5–8.1)

## 2020-09-02 LAB — CK: Total CK: 16946 U/L — ABNORMAL HIGH (ref 49–397)

## 2020-09-02 LAB — CBC
HCT: 24.7 % — ABNORMAL LOW (ref 39.0–52.0)
Hemoglobin: 8.2 g/dL — ABNORMAL LOW (ref 13.0–17.0)
MCH: 29.4 pg (ref 26.0–34.0)
MCHC: 33.2 g/dL (ref 30.0–36.0)
MCV: 88.5 fL (ref 80.0–100.0)
Platelets: 269 10*3/uL (ref 150–400)
RBC: 2.79 MIL/uL — ABNORMAL LOW (ref 4.22–5.81)
RDW: 14.6 % (ref 11.5–15.5)
WBC: 22.8 10*3/uL — ABNORMAL HIGH (ref 4.0–10.5)
nRBC: 0 % (ref 0.0–0.2)

## 2020-09-02 LAB — URINE CULTURE: Culture: 1000 — AB

## 2020-09-02 LAB — PROCALCITONIN: Procalcitonin: 34.97 ng/mL

## 2020-09-02 LAB — HEMOGLOBIN A1C
Hgb A1c MFr Bld: 4.6 % — ABNORMAL LOW (ref 4.8–5.6)
Mean Plasma Glucose: 85.32 mg/dL

## 2020-09-02 MED ORDER — SODIUM CHLORIDE 0.9 % IV SOLN
2.0000 g | INTRAVENOUS | Status: AC
  Administered 2020-09-02 – 2020-09-16 (×15): 2 g via INTRAVENOUS
  Filled 2020-09-02 (×3): qty 20
  Filled 2020-09-02: qty 2
  Filled 2020-09-02 (×2): qty 20
  Filled 2020-09-02: qty 0.08
  Filled 2020-09-02: qty 2
  Filled 2020-09-02: qty 20
  Filled 2020-09-02 (×2): qty 2
  Filled 2020-09-02 (×3): qty 20
  Filled 2020-09-02: qty 2
  Filled 2020-09-02: qty 0.08
  Filled 2020-09-02: qty 20
  Filled 2020-09-02: qty 2

## 2020-09-02 NOTE — Progress Notes (Signed)
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 It seems that his rate of rise is slowing down -continue with fluids -abdominal ultrasound without obstruction -would recommend transfer to [facility] for possible CRRT (or a facility with CRRT capabilities). Fortunately, no indications of renal replacement therapy at this time -Continue to monitor daily Cr, Dose meds for GFR<15 -Monitor Daily I/Os, Daily weight   -Maintain MAP>65 for optimal renal perfusion.  -Avoid nephrotoxic medications including NSAIDs and iodinated intravenous contrast exposure unless the latter is absolutely indicated.  Preferred narcotic agents for pain control are hydromorphone, fentanyl, and methadone. Morphine should not be used. Avoid Baclofen and avoid oral sodium phosphate and magnesium citrate based laxatives / bowel preps. Continue strict Input and Output monitoring. Will monitor the patient closely with you and intervene or adjust therapy as indicated by changes in clinical status/labs   Severe sepsis -positive bcx for gpc x 2 (streptococcus) -positive ucx for e faecalis -likely related to LLE cellulitis and UTI, abx per primary service  Rhabdomyolysis -no evidence of compartment synd. Nec fasc? -continue with fluids as above, trend CPK (improving slowly)  Hypotension -likely related to sepsis -home anti-hypertensives held -keep OIZ>12  Metabolic acidosis, anion gap. Stable -lactate improved, now more so from AKI -fluids changed to LR -if bicarb worsens, please check blood gas. If pH<7.2 would recommend starting bicarb infusion  Chronic hypokalemia. 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 Was supposed to see endocrinology for this. Would be cautious with repletion if needed.  Normocytic Anemia -Transfuse for Hgb<7 g/dL. Avoid iron in the setting of bacteremia/acute infection  Elevated LFT's -likely related to rhabdo, LFT's improving  Recommendations conveyed to primary service.   Subjective:   Hypotensive episodes overnight (systolic in the 45'Y w/ MAPS 39-44) but has now improved. Still having pain in his LLE. Denies any fevers, chills, chest pain, SOB, dizziness, n/v, dysgeusia, tremors, brain fog. He does report that he is now urinating less as compared to before.   Objective:   BP 126/72   Pulse 82   Temp 98.4 F (36.9 C) (Oral)   Resp 15   Ht 5\' 11"  (1.803 m)   Wt 114.8 kg   SpO2 97%   BMI 35.30 kg/m   Intake/Output Summary (Last 24 hours) at 09/02/2020 1216 Last data filed at 09/02/2020 1106 Gross per 24 hour  Intake 1102.85 ml  Output --  Net 1102.85 ml   Weight change:   Physical Exam: Gen:in slight distress 2/2 pain CVS:reg rate Resp:cta bl, no w/r/r/c heard KDX:IPJAS, nt Ext:LLE edema (painful to touch), trace RLE edema Neuro: no asterixis, speech clear and coherent, moves all ext spontaneously  Imaging: US Abdomen Complete  Result Date: 09/01/2020 CLINICAL DATA:  Elevated liver enzymes. History of aortic aneurysm and dissection. EXAM: ABDOMEN ULTRASOUND COMPLETE COMPARISON:  None. FINDINGS: Gallbladder: Within the gallbladder, there are echogenic foci which move and shadow consistent with cholelithiasis. Largest gallstone measures 5 mm in length. There is no appreciable gallbladder wall thickening or pericholecystic fluid. No sonographic Murphy sign noted by sonographer. Common bile duct: Diameter: 8 mm proximally, prominent. There is slight intrahepatic biliary duct dilatation. No biliary duct obstructing lesion is evident. Note that portions of the  mid to distal common bile duct are obscured by gas. Liver: No focal lesion  identified. Liver echogenicity overall is increased. Portal vein is patent on color Doppler imaging with normal direction of blood flow towards the liver. IVC: No abnormality visualized in portions that can be interrogated. Note that portions of the inferior vena cava obscured by gas. Pancreas: Most of the pancreas is obscured by gas. Spleen: Size and appearance within normal limits. Right Kidney: Length: 12.1 cm. Echogenicity within normal limits. No mass or hydronephrosis visualized. Left Kidney: Length: 12.8 cm. Echogenicity within normal limits. No mass or hydronephrosis visualized. Abdominal aorta: Proximal abdominal aorta measures 5.0 cm in diameter, stable from prior CT examination. Most of the remainder of the aorta is obscured by gas. The known dissection in this area is not appreciable on current sonographic evaluation. Other findings: No demonstrable ascites. IMPRESSION: 1. Cholelithiasis. No gallbladder wall thickening or pericholecystic fluid. 2. Prominence of the proximal common bile duct as well as mild intrahepatic biliary duct dilatation. No obstructing focus seen in the biliary ductal system. Note that portions of the mid to distal common bile duct obscured by gas. From an imaging standpoint, MRCP would be the optimum study of choice to further evaluate the biliary ductal system. 3. Proximal abdominal aortic aneurysm measuring approximately 5 cm in diameter, stable from prior CT examination. Most of the aorta is obscured by gas. The known dissection in this area of the aorta not appreciable by ultrasound. 4.  Most of pancreas and much of inferior vena cava obscured by gas. 5. Increase in liver echogenicity which may indicate hepatic steatosis with potential underlying parenchymal liver disease. No focal liver lesions are demonstrable on this study. Electronically Signed   By: Bretta Bang III M.D.   On: 09/01/2020 10:49   MR FEMUR LEFT WO CONTRAST  Result Date: 09/01/2020 CLINICAL DATA:   Left lower extremity pain and swelling. EXAM: MR OF THE LEFT FEMUR WITHOUT CONTRAST TECHNIQUE: Multiplanar, multisequence MR imaging of the left lower extremity was performed. No intravenous contrast was administered. COMPARISON:  CT scan 08/31/2020 FINDINGS: Both hips are normally located. Mild degenerative changes but no findings suspicious for septic arthritis or osteomyelitis. The left femur is intact. No evidence of osteomyelitis. Mild diffuse subcutaneous soft tissue swelling/edema/fluid suggesting cellulitis. Again demonstrated are enlarged and inflamed appearing left inguinal lymph nodes, likely a lymph adenitis. No discrete drainable subcutaneous soft tissue abscess is identified although study somewhat limited without IV contrast. Changes of myofasciitis mainly involving the medial compartment/adductor compartment muscles. No findings suspicious for pyomyositis. Similar findings involving the right upper thigh. Stable postoperative changes involving the left inguinal area with scar tissue. The major vascular structures demonstrate patent flow voids. Stable borderline enlarged left external iliac lymph node measuring 15.5 mm. No significant intrapelvic abnormalities are identified. IMPRESSION: 1. Mild diffuse subcutaneous soft tissue swelling/edema/fluid suggesting cellulitis. 2. Changes of myofasciitis mainly involving the medial compartment/adductor compartment muscles. No evidence of pyomyositis. 3. No findings suspicious for septic arthritis or osteomyelitis. 4. Stable enlarged and inflamed appearing left inguinal and left external iliac lymph nodes. Electronically Signed   By: Rudie Meyer M.D.   On: 09/01/2020 09:38   ECHOCARDIOGRAM COMPLETE  Result Date: 09/01/2020    ECHOCARDIOGRAM REPORT   Patient Name:   Connor [name] Date of Exam: 09/01/2020 Medical Rec #:  [id number]     Height:       71.0 in Accession #:    [id number]    Weight:  253.1 lb Date of Birth:  October 12, 1955    BSA:          2.330  m Patient Age:    64 years      BP:           93/60 mmHg Patient Gender: M             HR:           83 bpm. [facility] Procedure: 2D Echo, Cardiac Doppler and Color Doppler Indications:    Bacteremia R78.81  History:        Patient has prior history of Echocardiogram examinations, most                 recent 10/02/2019. Arrythmias:Atrial Fibrillation; Risk                 Factors:Hypertension. History of open heart surgery (From Hx),                 History of aortic dissection s/p repair in 2016.  Sonographer:    Celesta Gentile RCS Referring Phys: [id number] CHRISTOPHER P DANFORD IMPRESSIONS  1. Left ventricular ejection fraction, by estimation, is 60 to 65%. The left ventricle has normal function. The left ventricle has no regional wall motion abnormalities. There is severe left ventricular hypertrophy. Left ventricular diastolic parameters  are consistent with Grade II diastolic dysfunction (pseudonormalization).  2. Right ventricular systolic function is normal. The right ventricular size is normal. There is normal pulmonary artery systolic pressure. The estimated right ventricular systolic pressure is 29.6 mmHg.  3. Left atrial size was severely dilated.  4. Right atrial size was moderately dilated.  5. The mitral valve is grossly normal. Trivial mitral valve regurgitation.  6. The aortic valve is tricuspid. Aortic valve regurgitation is not visualized.  7. The inferior vena cava is normal in size with greater than 50% respiratory variability, suggesting right atrial pressure of 3 mmHg. FINDINGS  Left Ventricle: Left ventricular ejection fraction, by estimation, is 60 to 65%. The left ventricle has normal function. The left ventricle has no regional wall motion abnormalities. The left ventricular internal cavity size was normal in size. There is  severe left ventricular hypertrophy. Left ventricular diastolic parameters are consistent with Grade II diastolic dysfunction (pseudonormalization). Right  Ventricle: The right ventricular size is normal. No increase in right ventricular wall thickness. Right ventricular systolic function is normal. There is normal pulmonary artery systolic pressure. The tricuspid regurgitant velocity is 2.58 m/s, and  with an assumed right atrial pressure of 3 mmHg, the estimated right ventricular systolic pressure is 29.6 mmHg. Left Atrium: Left atrial size was severely dilated. Right Atrium: Right atrial size was moderately dilated. Pericardium: There is no evidence of pericardial effusion. Mitral Valve: The mitral valve is grossly normal. Trivial mitral valve regurgitation. Tricuspid Valve: The tricuspid valve is grossly normal. Tricuspid valve regurgitation is trivial. Aortic Valve: The aortic valve is tricuspid. Aortic valve regurgitation is not visualized. Pulmonic Valve: The pulmonic valve was grossly normal. Pulmonic valve regurgitation is trivial. Aorta: The aortic root is normal in size and structure. Venous: The inferior vena cava is normal in size with greater than 50% respiratory variability, suggesting right atrial pressure of 3 mmHg. IAS/Shunts: No atrial level shunt detected by color flow Doppler.  LEFT VENTRICLE PLAX 2D LVIDd:         4.60 cm  Diastology LVIDs:         2.90 cm  LV e' medial:  7.94 cm/s LV PW:         1.70 cm  LV E/e' medial:  17.9 LV IVS:        1.80 cm  LV e' lateral:   12.20 cm/s LVOT diam:     2.00 cm  LV E/e' lateral: 11.6 LV SV:         95 LV SV Index:   41 LVOT Area:     3.14 cm  RIGHT VENTRICLE RV S prime:     15.00 cm/s TAPSE (M-mode): 2.2 cm LEFT ATRIUM              Index       RIGHT ATRIUM           Index LA diam:        4.70 cm  2.02 cm/m  RA Area:     28.20 cm LA Vol (A2C):   100.0 ml 42.91 ml/m RA Volume:   95.10 ml  40.81 ml/m LA Vol (A4C):   127.0 ml 54.50 ml/m LA Biplane Vol: 115.0 ml 49.35 ml/m  AORTIC VALVE LVOT Vmax:   144.00 cm/s LVOT Vmean:  106.000 cm/s LVOT VTI:    0.301 m  AORTA Ao Root diam: 3.30 cm MITRAL VALVE                 TRICUSPID VALVE MV Area (PHT): 3.05 cm     TR Peak grad:   26.6 mmHg MV Decel Time: 249 msec     TR Vmax:        258.00 cm/s MV E velocity: 142.00 cm/s MV A velocity: 50.90 cm/s   SHUNTS MV E/A ratio:  2.79         Systemic VTI:  0.30 m                             Systemic Diam: 2.00 cm Nona Dell MD Electronically signed by Nona Dell MD Signature Date/Time: 09/01/2020/4:23:11 PM    Final    CT EXTREMITY LOWER LEFT WO CONTRAST  Result Date: 08/31/2020 CLINICAL DATA:  Edema and swelling over left lower extremity EXAM: CT OF THE LOWER LEFT EXTREMITY WITHOUT CONTRAST TECHNIQUE: Multidetector CT imaging of the lower left extremity was performed according to the standard protocol. COMPARISON:  Ultrasound 08/30/2020, CT angiography 03/01/2020, 06/19/2019 FINDINGS: Bones/Joint/Cartilage No fracture or malalignment. No periostitis or bony destructive change. No significant hip or knee effusion is visualized. Ligaments Suboptimally assessed by CT. Muscles and Tendons No significant atrophy.  No intramuscular fluid collections. Soft tissues Mild vascular calcifications. Focal distortion within the left groin with surgical clips, unchanged and presumably due to surgical scarring. Multiple enlarged left inguinal lymph nodes, the largest measures 3.01 2.7 by 2.5 cm and corresponds to the ultrasound demonstrated abnormal lymph node. Moderate subcutaneous edema and fluid within the left lower extremity extending from the hip to the imaged proximal lower leg. Edema most heavily concentrated at the distal thigh and knee. Skin thickening and edematous infiltration of subcutaneous fat of the anterior, medial, and lateral thigh with small amount of fluid superficial to the lateral quadriceps muscles. No gas containing fluid collections. IMPRESSION: 1. Moderate subcutaneous edema within the left lower extremity extending from the hip to the imaged proximal lower leg, possible cellulitis or nonspecific lower  extremity edema. No gas containing fluid collections are seen to suggest soft tissue abscess. Note that assessment for abscess is limited without contrast. There is no acute osseous  abnormality. 2. Multiple enlarged left inguinal lymph nodes, the largest measuring up to 3 cm, corresponding to the ultrasound demonstrated abnormal lymph node on recent ultrasound. Findings are indeterminate for reactive adenopathy or malignant adenopathy. Tissue sampling was previously suggested. 3. No acute osseous abnormality. Electronically Signed   By: Jasmine Pang M.D.   On: 08/31/2020 21:18    Labs: BMET Recent Labs  Lab 08/30/20 1947 08/31/20 0414 08/31/20 1912 09/01/20 0532 09/01/20 1538 09/02/20 0416  NA 137 134*  --  135 137 138  K 2.8* 3.3*  --  3.9 3.3* 3.4*  CL 103 102  --  102 102 103  CO2 22 20*  --  16* 20* 18*  GLUCOSE 162* 143*  --  104* 101* 106*  BUN 55* 54*  --  78* 84* 91*  CREATININE 4.21* 4.56* 5.77* 6.51* 7.17* 7.61*  CALCIUM 7.8* 7.3*  --  7.0* 7.1* 7.0*  PHOS  --  3.5  --   --   --   --    CBC Recent Labs  Lab 08/30/20 1947 08/31/20 0414 09/01/20 0532 09/01/20 1538 09/02/20 0416  WBC 15.6* 18.3* 21.4* 22.2* 22.8*  NEUTROABS 13.3*  --   --   --   --   HGB 10.5* 10.1* 9.1* 8.3* 8.2*  HCT 31.7* 31.0* 29.2* 25.3* 24.7*  MCV 91.1 92.5 98.6 91.7 88.5  PLT 136* 137* 187 206 269    Medications:    . aspirin EC  81 mg Oral Daily  . Chlorhexidine Gluconate Cloth  6 each Topical Q0600  . feeding supplement  237 mL Oral BID BM  . mouth rinse  15 mL Mouth Rinse BID      [name], MD [facility] 09/02/2020, 12:16 PM

## 2020-09-02 NOTE — TOC Progression Note (Signed)
Transition of Care [facility]) - Progression Note   Patient Details  Name: Connor [name] MRN: [id number] Date of Birth: 10-08-1955  [facility]) CM/SW Contact  Ewing Schlein, LCSW Phone Number: 09/02/2020, 12:41 PM  Clinical Narrative: CSW followed up with patient regarding SNF. Patient reported he is still unsure if he is willing to sign over his check to go to SNF. TOC to follow.  Expected Discharge Plan: Skilled Nursing Facility Barriers to Discharge: Continued Medical Work up  Expected Discharge Plan and Services Expected Discharge Plan: Skilled Nursing Facility In-house Referral: Clinical Social Work Living arrangements for the past 2 months: Single Family Home  Readmission Risk Interventions No flowsheet data found.

## 2020-09-02 NOTE — Progress Notes (Signed)
Patient ID: Connor [name], male   DOB: 1956-05-02, 65 y.o.   MRN: [id number]  Peripherally following this patient after consultation Wednesday  The MRI findings of myo- fasciitis often requires debridement if antibiotics do not clear his infection.  This patient has a clotted ascending thoracic aorta and has had aortic repair in the past and is not a good surgical candidate.    CT scan of his chest abdomen and pelvis was reviewed with the patient.  This was from 03/01/2020.  This shows stable dissection.  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  Maximal diameter decreased from 6.0-5.4 in the chest.  Also decreased from 4.9-4.5 at the level of the celiac artery in the abdomen   Strongly consider transferring this patient to another facility in case he needs surgery

## 2020-09-02 NOTE — Progress Notes (Signed)
Physical Therapy Treatment Patient Details Name: Connor [name] MRN: [id number] DOB: 23-Sep-1955 Today's Date: 09/02/2020    History of Present Illness Connor [name] is a 65 y.o. male with medical history significant for aortic dissection, A. fib on Eliquis hypertension and obesity who presents to the emergency department due to 5-day onset of nausea, vomiting and diarrhea.  Patient states that he had a "food poisoning" after eating meatloaf on Thursday (08/25/2020), he endorsed several episodes of nonbloody, nonbilious vomiting which stopped on Saturday (1/2), diarrhea was several episodes daily and watery in nature with last episode being this morning she complained of fever that started today and he also complained of pain and swelling in his left leg.  Patient lives at home with his cousin, he ambulates with a cane at baseline and he has not had Covid vaccine.    PT Comments    Patient demonstrates increased endurance/distance for taking steps in room using SPC with mostly 3 point gait pattern and slow labored movement, able to stand over commode to urinate and tolerated sitting up on [facility] after therapy with his daughter present in room - RN aware.  Patient had episode of bleeding from IV to left wrist during therapy session - RN notified and dressed IV insertion site.  Patient will benefit from continued physical therapy in hospital and recommended venue below to increase strength, balance, endurance for safe ADLs and gait.   Follow Up Recommendations  SNF;Supervision for mobility/OOB;Supervision - Intermittent     Equipment Recommendations  Rolling walker with 5" wheels    Recommendations for Other Services       Precautions / Restrictions Precautions Precautions: Fall Restrictions Weight Bearing Restrictions: No    Mobility  Bed Mobility Overal bed mobility: Needs Assistance Bed Mobility: Supine to Sit     Supine to sit: Min assist;Mod assist     General bed mobility  comments: demonstrates slightl improvement for moving LLE, but limited due to increased pain  Transfers Overall transfer level: Needs assistance Equipment used: Straight cane;1 person hand held assist Transfers: Sit to/from Stand;Stand Pivot Transfers Sit to Stand: Min guard;Min assist Stand pivot transfers: Min guard       General transfer comment: demonstrates increased tolerance for weightbearing on LLE during sit to stand and transferring to [facility]  Ambulation/Gait Ambulation/Gait assistance: Min guard;Min assist Gait Distance (Feet): 15 Feet Assistive device: Straight cane Gait Pattern/deviations: Step-to pattern;Decreased step length - left;Decreased stride length;Decreased stance time - left Gait velocity: decreased   General Gait Details: demonstrates increased endurance/distance for taking steps using SPC, sometimes having to support self with both hands on cane, limited secondary to fatigue   Stairs             Wheelchair Mobility    Modified Rankin (Stroke Patients Only)       Balance Overall balance assessment: Needs assistance Sitting-balance support: Feet supported;No upper extremity supported Sitting balance-Leahy Scale: Good Sitting balance - Comments: seated at EOB   Standing balance support: During functional activity;Single extremity supported Standing balance-Leahy Scale: Fair Standing balance comment: using SPC                            Cognition Arousal/Alertness: Awake/alert Behavior During Therapy: WFL for tasks assessed/performed Overall Cognitive Status: Within Functional Limits for tasks assessed  Exercises      General Comments        Pertinent Vitals/Pain Pain Assessment: Faces Faces Pain Scale: Hurts even more Pain Location: LLE with movement Pain Descriptors / Indicators: Grimacing;Guarding;Sore Pain Intervention(s): Limited activity within patient's  tolerance;Monitored during session;Premedicated before session    Home Living                      Prior Function            PT Goals (current goals can now be found in the care plan section) Acute Rehab PT Goals Patient Stated Goal: return home able to take care of self PT Goal Formulation: With patient Time For Goal Achievement: 09/14/20 Potential to Achieve Goals: Good Progress towards PT goals: Progressing toward goals    Frequency    Min 3X/week      PT Plan Current plan remains appropriate    Co-evaluation              AM-PAC PT "6 Clicks" Mobility   Outcome Measure  Help needed turning from your back to your side while in a flat bed without using bedrails?: A Lot Help needed moving from lying on your back to sitting on the side of a flat bed without using bedrails?: A Lot Help needed moving to and from a bed to a chair (including a wheelchair)?: A Little Help needed standing up from a chair using your arms (e.g., wheelchair or bedside chair)?: A Little Help needed to walk in hospital room?: A Lot Help needed climbing 3-5 steps with a railing? : A Lot 6 Click Score: 14    End of Session   Activity Tolerance: Patient tolerated treatment well;Patient limited by fatigue;Patient limited by pain Patient left: with call bell/phone within reach;with family/visitor present;Other (comment) (seated on [facility]) Nurse Communication: Mobility status PT Visit Diagnosis: Unsteadiness on feet (R26.81);Other abnormalities of gait and mobility (R26.89);Muscle weakness (generalized) (M62.81)     Time: 3235-5732 PT Time Calculation (min) (ACUTE ONLY): 23 min  Charges:  $Therapeutic Exercise: 8-22 mins $Therapeutic Activity: 8-22 mins                     2:36 PM, 09/02/20 Ocie Bob, MPT Physical Therapist with [facility] [phone] office [phone] mobile phone

## 2020-09-02 NOTE — Progress Notes (Signed)
PROGRESS NOTE  Connor [name] JXB:[id number] DOB: 1956/01/23 DOA: 08/30/2020 PCP: Patient, No Pcp Per  HPI/Recap of past 80 hours: 65 year old male with past medical history of hypertension, morbid obesity, stage II-3 chronic kidney disease, atrial fibrillation on Eliquis and history of ascending aortic dissection status post open repair in 2016 with redo in 2019 as well as stenting to left common and external iliac in 2020 with course complicated by cardiogenic shock and acute kidney injury requiring short-term dialysis presented to the emergency room on 1/4 with 1 week of nausea/vomiting/chills.  Initially he attributed this to a gastroenteritis.  After few days, started having left leg pain and swelling to the point where he came to the emergency room.  In the emergency room, he was found to be in severe sepsis with a white count of 15, lactate of 2 and creatinine of 4.2 with a baseline at 1.3.  His potassium was 2.8.  Patient started on IV fluids and antibiotics and admitted for cellulitis.  Ultrasound ruled out DVT.  Seen by orthopedic surgery and CT scan noted soft tissue swelling but no muscle damage and no signs of abscess although limited due to lack of contrast and CT due to renal function.  MRI noted signs of myofascitis.  Patient also noted to have rhabdomyolysis after urinalysis noting strong heme but few RBCs and CK greater than 28,000.  Rhabdo felt to be infectious.  Patient was given 4 L of fluid with little urine output and so fluids were stopped on 1/5, but then restarted 1/6.  Nephrology consulted who felt that acute kidney injury likely secondary to sepsis and rhabdomyolysis.  Urinalysis grew out only 8000 colonies of possible Enterococcus, but multiple blood cultures grew out strep G, consistent with his cellulitis.  From an infection standpoint, patient showing signs of improvement with procalcitonin level trending downward and clinically he feels better.  Rhabdomyolysis notes downward  trending CPK, today at 17,000.  Renal function continues to worsen although patient had large volume of urine out today (not recorded) and is not currently showing any signs of uremia including mental status changes or loss of appetite.  Patient is doing okay, feeling better with less leg pain and swelling.  Assessment/Plan: Principal Problem:   Severe Streptococcus sepsis with acute organ dysfunction due to left lower extremity cellulitis: Patient meets criteria for severe sepsis on admission secondary to lower extremity cellulitis given tachycardia, tachypnea, leukocytosis with cellulitis source and lactic acidosis.  With lactic acidosis better, his sepsis may be more stabilizing.  White count may be worsening due to declining renal function which may be more of a byproduct of rhabdomyolysis.  Nevertheless, continue aggressive IV fluids and antibiotics.  Urine culture only growing 1000 colonies seems irrelevant.  Continue cefepime and waiting sensitivities.  No signs of compartment syndrome or DVT.  MRI done 1/6 notes mild diffuse subcutaneous soft tissue swelling and edema suggesting cellulitis and changes of myofascitis involving medial compartment muscles but no evidence of pyomyositis, septic arthritis, osteomyelitis and some stable and enlarged lymph nodes.  From patient's clinical status and procalcitonin, infection looks to be improving.  I suspect his white blood cell count is worsening in regards to his renal function.  Active Problems:   Persistent atrial fibrillation (HCC): Stable and rate controlled.  On amiodarone    Hypertension: Borderline hypotension although this has since improved with fluids    Elevated troponin: Mildly elevated, likely more in the setting of renal disease.  No signs of ACS.  Thrombocytopenia (HCC) and elevated total bilirubin: Signs of sepsis.  Both have improved over the course of the past few days.  Transaminitis may be more from hypotension and shock liver,  numbers are improving as well.    Hypokalemia: Replace as needed    Hyperglycemia: Mildly elevated.  No previous history of diabetes, secondary covering with sliding scale.  A1c at 4.6.    Nausea vomiting and diarrhea: Resolved.  Likely secondary to sepsis.  Acute kidney injury in the setting of CKD (chronic kidney disease), stage III (HCC) secondary to rhabdomyolysis and hypotension: Worsening.  Restarted IV fluids and continue to monitor.  Still trending upward although no signs of uremia.  Patient is having some urine output (although not recorded in input/output).  Have placed on transfer list to [facility] when bed available.  If renal function continues to decline and no bed is available, will discuss with vascular surgery about transfer down to come in for placement of dialysis catheter and then returning back here.    Morbid obesity (HCC): Meets criteria in the context of BMI greater than 35+ hypertension, CAD   Code Status: Full code  Family Communication: Updated multiple family members by video chat and his daughter in person  Disposition Plan: Continue in stepdown.  If renal function continues to decline, will request transfer to [facility] for potential dialysis   Consultants:  Orthopedic surgery  Nephrology  Procedures:  Echocardiogram done 1/6: Results pending  Lower extremity Dopplers done 1/5, no evidence of DVT  Antimicrobials:  IV vancomycin 1/4-1/6  IV cefepime 1/4-1/7  IV Rocephin 1/7-present  IV clindamycin 1/4-1/6  DVT prophylaxis: SCDs  I have spent 45 minutes in the care of this critically ill patient including discussion with specialist, bedside examination, interpretation of patient's labs and radiology studies and medical decision making.  Objective: Vitals:   09/02/20 1000 09/02/20 1154  BP: 126/72   Pulse: 82   Resp: 15   Temp:  98.4 F (36.9 C)  SpO2: 97%     Intake/Output Summary (Last 24 hours) at 09/02/2020 1542 Last data  filed at 09/02/2020 1106 Gross per 24 hour  Intake 523.52 ml  Output --  Net 523.52 ml   Filed Weights   08/30/20 1814 08/31/20 1156 09/01/20 0540  Weight: 112.9 kg 114.8 kg 114.8 kg   Body mass index is 35.3 kg/m.  Exam:   General: Alert and oriented x3, no acute distress  HEENT: Normocephalic and atraumatic, neck is thick, narrow airway, mucous membranes are dry  Cardiovascular: Regular rate and rhythm, occasional ectopic beat  Respiratory: Clear to auscultation bilaterally  Abdomen: Soft, nontender, nondistended, positive bowel sounds  Musculoskeletal: No clubbing or cyanosis.  Left lower extremity is mildly edematous, 1+ edema and warmer.  Improved from previous day.  Skin: Mild erythema of the left lower extremity below the knee  Psychiatry: Appropriate, no evidence of psychoses   Data Reviewed: CBC: Recent Labs  Lab 08/30/20 1947 08/31/20 0414 09/01/20 0532 09/01/20 1538 09/02/20 0416  WBC 15.6* 18.3* 21.4* 22.2* 22.8*  NEUTROABS 13.3*  --   --   --   --   HGB 10.5* 10.1* 9.1* 8.3* 8.2*  HCT 31.7* 31.0* 29.2* 25.3* 24.7*  MCV 91.1 92.5 98.6 91.7 88.5  PLT 136* 137* 187 206 269   Basic Metabolic Panel: Recent Labs  Lab 08/30/20 1947 08/31/20 0414 08/31/20 1912 09/01/20 0532 09/01/20 1538 09/02/20 0416  NA 137 134*  --  135 137 138  K 2.8* 3.3*  --  3.9 3.3* 3.4*  CL 103 102  --  102 102 103  CO2 22 20*  --  16* 20* 18*  GLUCOSE 162* 143*  --  104* 101* 106*  BUN 55* 54*  --  78* 84* 91*  CREATININE 4.21* 4.56* 5.77* 6.51* 7.17* 7.61*  CALCIUM 7.8* 7.3*  --  7.0* 7.1* 7.0*  MG  --  2.0  --   --   --   --   PHOS  --  3.5  --   --   --   --    GFR: Estimated Creatinine Clearance: 12.8 mL/min (A) (by C-G formula based on SCr of 7.61 mg/dL (H)). Liver Function Tests: Recent Labs  Lab 08/30/20 1947 08/31/20 0414 09/01/20 0532 09/01/20 1538 09/02/20 0416  AST 279* 274* 220* 193* 173*  ALT 60* 58* 56* 54* 54*  ALKPHOS 69 59 75 77 84   BILITOT 3.0* 4.1* 3.2* 2.8* 2.3*  PROT 7.2 6.5 6.3* 6.1* 6.4*  ALBUMIN 2.8* 2.6* 2.5* 2.2* 2.3*   No results for input(s): LIPASE, AMYLASE in the last 168 hours. No results for input(s): AMMONIA in the last 168 hours. Coagulation Profile: Recent Labs  Lab 08/30/20 1947 08/31/20 0414  INR 1.5* 2.0*   Cardiac Enzymes: Recent Labs  Lab 08/31/20 1418 09/01/20 0532 09/02/20 0416  CKTOTAL 28,083* 24,138* 16,946*   BNP (last 3 results) Recent Labs    09/08/19 1645  PROBNP 719*   HbA1C: Recent Labs    09/02/20 0416  HGBA1C 4.6*   CBG: Recent Labs  Lab 08/30/20 1940  GLUCAP 163*   Lipid Profile: No results for input(s): CHOL, HDL, LDLCALC, TRIG, CHOLHDL, LDLDIRECT in the last 72 hours. Thyroid Function Tests: No results for input(s): TSH, T4TOTAL, FREET4, T3FREE, THYROIDAB in the last 72 hours. Anemia Panel: No results for input(s): VITAMINB12, FOLATE, FERRITIN, TIBC, IRON, RETICCTPCT in the last 72 hours. Urine analysis:    Component Value Date/Time   COLORURINE AMBER (A) 08/31/2020 0130   APPEARANCEUR CLOUDY (A) 08/31/2020 0130   LABSPEC 1.017 08/31/2020 0130   PHURINE 5.0 08/31/2020 0130   GLUCOSEU NEGATIVE 08/31/2020 0130   HGBUR LARGE (A) 08/31/2020 0130   BILIRUBINUR NEGATIVE 08/31/2020 0130   KETONESUR NEGATIVE 08/31/2020 0130   PROTEINUR 100 (A) 08/31/2020 0130   NITRITE NEGATIVE 08/31/2020 0130   LEUKOCYTESUR NEGATIVE 08/31/2020 0130   Sepsis Labs: @LABRCNTIP (procalcitonin:4,lacticidven:4)  ) Recent Results (from the past 240 hour(s))  Blood Culture (routine x 2)     Status: Abnormal   Collection Time: 08/30/20  7:34 PM   Specimen: BLOOD  Result Value Ref Range Status   Specimen Description   Final    BLOOD BLOOD LEFT ARM Performed at [facility], [address] [place], [place] [address]    Special Requests   Final    BOTTLES DRAWN AEROBIC AND ANAEROBIC Blood Culture adequate volume Performed at [facility], [address],  [place], [place] [address]    Culture  Setup Time   Final    GRAM POSITIVE COCCI IN CHAINS ANAEROBIC BOTTLE ONLY Gram Stain Report Called to,Read Back By and Verified With: MICHAEL DOSS @0905  08/31/20 BY JONES,T [facility] CORRECTED RESULTS PREVIOUSLY REPORTED AS: GRAM POSITIVE RODS CORRECTED RESULTS CALLED TO: S HURTH PHARMD @1409  08/31/20 EB Performed at [facility], [place] [address], [place],  [address]    Culture STREPTOCOCCUS GROUP G (A)  Final   Report Status 09/02/2020 FINAL  Final   Organism ID, Bacteria STREPTOCOCCUS GROUP G  Final      Susceptibility   Streptococcus group g - MIC*    CLINDAMYCIN <=0.25 SENSITIVE Sensitive     AMPICILLIN <=0.25 SENSITIVE Sensitive     ERYTHROMYCIN <=0.12 SENSITIVE Sensitive     VANCOMYCIN <=0.12 SENSITIVE Sensitive     CEFTRIAXONE <=0.12 SENSITIVE Sensitive     LEVOFLOXACIN 0.5 SENSITIVE Sensitive     * STREPTOCOCCUS GROUP G  Blood Culture (routine x 2)     Status: Abnormal   Collection Time: 08/30/20  7:35 PM   Specimen: BLOOD RIGHT ARM  Result Value Ref Range Status   Specimen Description   Final    BLOOD RIGHT ARM Performed at [facility], [address], [place], [place] [address]    Special Requests   Final    BOTTLES DRAWN AEROBIC AND ANAEROBIC Blood Culture adequate volume Performed at [facility], [address] [place], [place] [address]    Culture  Setup Time   Final    GRAM POSITIVE COCCI IN CHAINS IN BOTH AEROBIC AND ANAEROBIC BOTTLES Gram Stain Report Called to,Read Back By and Verified With: MICHAEL DOSS @0905  08/31/20 BY JONES,T [facility] Organism ID to follow CORRECTED RESULTS PREVIOUSLY REPORTED AS: GRAM POSITIVE RODS CORRECTED RESULTS CALLED TO: S HURTH PHARMD @1409  08/31/20 EB    Culture (A)  Final    STREPTOCOCCUS GROUP G SUSCEPTIBILITIES PERFORMED ON PREVIOUS CULTURE WITHIN THE LAST 5 DAYS. Performed at [facility], [address], [place], [address] [place]    Report Status 09/02/2020 FINAL  Final  Resp  Panel by RT-PCR (Flu A&B, Covid) Nasopharyngeal Swab     Status: None   Collection Time: 08/30/20  7:35 PM   Specimen: Nasopharyngeal Swab; Nasopharyngeal(NP) swabs in vial transport medium  Result Value Ref Range Status   SARS Coronavirus 2 by RT PCR NEGATIVE NEGATIVE Final    Comment: (NOTE) SARS-CoV-2 target nucleic acids are NOT DETECTED.  The SARS-CoV-2 RNA is generally detectable in upper respiratory specimens during the acute phase of infection. The lowest concentration of SARS-CoV-2 viral copies this assay can detect is 138 copies/mL. A negative result does not preclude SARS-Cov-2 infection and should not be used as the sole basis for treatment or other patient management decisions. A negative result may occur with  improper specimen collection/handling, submission of specimen other than nasopharyngeal swab, presence of viral mutation(s) within the areas targeted by this assay, and inadequate number of viral copies(<138 copies/mL). A negative result must be combined with clinical observations, patient history, and epidemiological information. The expected result is Negative.  Fact Sheet for Patients:  10/31/2020  Fact Sheet for Healthcare Providers:  10/28/20  This test is no t yet approved or cleared by the [employer] FDA and  has been authorized for detection and/or diagnosis of SARS-CoV-2 by FDA under an Emergency Use Authorization (EUA). This EUA will remain  in effect (meaning this test can be used) for the duration of the COVID-19 declaration under Section 564(b)(1) of the Act, 21 U.S.C.section 360bbb-3(b)(1), unless the authorization is terminated  or revoked sooner.       Influenza A by PCR NEGATIVE NEGATIVE Final   Influenza B by PCR NEGATIVE NEGATIVE Final    Comment: (NOTE) The Xpert Xpress SARS-CoV-2/FLU/RSV plus assay is intended as an aid in the diagnosis of influenza from Nasopharyngeal  swab specimens and should not be used as a sole basis for treatment. Nasal washings and aspirates are unacceptable for Xpert Xpress SARS-CoV-2/FLU/RSV testing.  Fact Sheet for Patients:  [employer]loggerCourse.comhttps://www.fda.gov/media/152166/download  Fact Sheet for Healthcare Providers: SeriousBroker.ithttps://www.fda.gov/media/152162/download  This test is not yet approved or cleared by the [place] FDA and has been authorized for detection and/or diagnosis of SARS-CoV-2 by FDA under an Emergency Use Authorization (EUA). This EUA will remain in effect (meaning this test can be used) for the duration of the COVID-19 declaration under Section 564(b)(1) of the Act, 21 U.S.C. section 360bbb-3(b)(1), unless the authorization is terminated or revoked.  Performed at [facility], [address] [place], [place] [address]   Blood Culture ID Panel (Reflexed)     Status: Abnormal   Collection Time: 08/30/20  7:35 PM  Result Value Ref Range Status   Enterococcus faecalis NOT DETECTED NOT DETECTED Final   Enterococcus Faecium NOT DETECTED NOT DETECTED Final   Listeria monocytogenes NOT DETECTED NOT DETECTED Final   Staphylococcus species NOT DETECTED NOT DETECTED Final   Staphylococcus aureus (BCID) NOT DETECTED NOT DETECTED Final   Staphylococcus epidermidis NOT DETECTED NOT DETECTED Final   Staphylococcus lugdunensis NOT DETECTED NOT DETECTED Final   Streptococcus species DETECTED (A) NOT DETECTED Final    Comment: Not Enterococcus species, Streptococcus agalactiae, Streptococcus pyogenes, or Streptococcus pneumoniae. CRITICAL RESULT CALLED TO, READ BACK BY AND VERIFIED WITH: G. Coffee PharmD 16:30 08/31/20 ([email or web address])    Streptococcus agalactiae NOT DETECTED NOT DETECTED Final   Streptococcus pneumoniae NOT DETECTED NOT DETECTED Final   Streptococcus pyogenes NOT DETECTED NOT DETECTED Final   A.calcoaceticus-baumannii NOT DETECTED NOT DETECTED Final   Bacteroides fragilis NOT DETECTED NOT DETECTED Final    Enterobacterales NOT DETECTED NOT DETECTED Final   Enterobacter cloacae complex NOT DETECTED NOT DETECTED Final   Escherichia coli NOT DETECTED NOT DETECTED Final   Klebsiella aerogenes NOT DETECTED NOT DETECTED Final   Klebsiella oxytoca NOT DETECTED NOT DETECTED Final   Klebsiella pneumoniae NOT DETECTED NOT DETECTED Final   Proteus species NOT DETECTED NOT DETECTED Final   Salmonella species NOT DETECTED NOT DETECTED Final   Serratia marcescens NOT DETECTED NOT DETECTED Final   Haemophilus influenzae NOT DETECTED NOT DETECTED Final   Neisseria meningitidis NOT DETECTED NOT DETECTED Final   Pseudomonas aeruginosa NOT DETECTED NOT DETECTED Final   Stenotrophomonas maltophilia NOT DETECTED NOT DETECTED Final   Candida albicans NOT DETECTED NOT DETECTED Final   Candida auris NOT DETECTED NOT DETECTED Final   Candida glabrata NOT DETECTED NOT DETECTED Final   Candida krusei NOT DETECTED NOT DETECTED Final   Candida parapsilosis NOT DETECTED NOT DETECTED Final   Candida tropicalis NOT DETECTED NOT DETECTED Final   Cryptococcus neoformans/gattii NOT DETECTED NOT DETECTED Final    Comment: Performed at [facility], [address] [place], [place] [address]  Urine culture     Status: Abnormal   Collection Time: 08/31/20  1:30 AM   Specimen: In/Out Cath Urine  Result Value Ref Range Status   Specimen Description   Final    IN/OUT CATH URINE Performed at [facility], [address] [place], [place] [address]    Special Requests   Final    NONE Performed at [facility], [address] [place], [place] [address]    Culture 1,000 COLONIES/mL ENTEROCOCCUS FAECALIS (A)  Final   Report Status 09/02/2020 FINAL  Final   Organism ID, Bacteria ENTEROCOCCUS FAECALIS (A)  Final      Susceptibility   Enterococcus faecalis - MIC*    AMPICILLIN <=2 SENSITIVE Sensitive     NITROFURANTOIN <=16 SENSITIVE Sensitive     VANCOMYCIN 1 SENSITIVE  Sensitive     * 1,000 COLONIES/mL ENTEROCOCCUS  FAECALIS  MRSA PCR Screening     Status: None   Collection Time: 08/31/20 11:36 AM   Specimen: Nasal Mucosa; Nasopharyngeal  Result Value Ref Range Status   MRSA by PCR NEGATIVE NEGATIVE Final    Comment:        The GeneXpert MRSA Assay (FDA approved for NASAL specimens only), is one component of a comprehensive MRSA colonization surveillance program. It is not intended to diagnose MRSA infection nor to guide or monitor treatment for MRSA infections. Performed at [facility], [address], [place], [place] [address]       Studies: No results found.  Scheduled Meds: . aspirin EC  81 mg Oral Daily  . Chlorhexidine Gluconate Cloth  6 each Topical Q0600  . feeding supplement  237 mL Oral BID BM  . mouth rinse  15 mL Mouth Rinse BID    Continuous Infusions: . amiodarone 30 mg/hr (09/02/20 0947)  . cefTRIAXone (ROCEPHIN)  IV    . lactated ringers 125 mL/hr at 09/02/20 1007     LOS: 3 days     Hollice Espy, MD [facility]   09/02/2020, 3:42 PM

## 2020-09-03 DIAGNOSIS — R6520 Severe sepsis without septic shock: Secondary | ICD-10-CM | POA: Diagnosis not present

## 2020-09-03 DIAGNOSIS — A409 Streptococcal sepsis, unspecified: Secondary | ICD-10-CM | POA: Diagnosis not present

## 2020-09-03 LAB — BASIC METABOLIC PANEL
Anion gap: 15 (ref 5–15)
BUN: 99 mg/dL — ABNORMAL HIGH (ref 8–23)
CO2: 20 mmol/L — ABNORMAL LOW (ref 22–32)
Calcium: 7.6 mg/dL — ABNORMAL LOW (ref 8.9–10.3)
Chloride: 103 mmol/L (ref 98–111)
Creatinine, Ser: 7.46 mg/dL — ABNORMAL HIGH (ref 0.61–1.24)
GFR, Estimated: 8 mL/min — ABNORMAL LOW (ref >60)
Glucose, Bld: 107 mg/dL — ABNORMAL HIGH (ref 70–99)
Potassium: 3.4 mmol/L — ABNORMAL LOW (ref 3.5–5.1)
Sodium: 138 mmol/L (ref 135–145)

## 2020-09-03 LAB — CK: Total CK: 8688 U/L — ABNORMAL HIGH (ref 49–397)

## 2020-09-03 LAB — PROCALCITONIN: Procalcitonin: 20.62 ng/mL

## 2020-09-03 LAB — HEPARIN LEVEL (UNFRACTIONATED): Heparin Unfractionated: 0.37 IU/mL (ref 0.30–0.70)

## 2020-09-03 LAB — GLUCOSE, CAPILLARY: Glucose-Capillary: 100 mg/dL — ABNORMAL HIGH (ref 70–99)

## 2020-09-03 LAB — CBC
HCT: 23.7 % — ABNORMAL LOW (ref 39.0–52.0)
Hemoglobin: 8.2 g/dL — ABNORMAL LOW (ref 13.0–17.0)
MCH: 29.8 pg (ref 26.0–34.0)
MCHC: 34.6 g/dL (ref 30.0–36.0)
MCV: 86.2 fL (ref 80.0–100.0)
Platelets: 406 10*3/uL — ABNORMAL HIGH (ref 150–400)
RBC: 2.75 MIL/uL — ABNORMAL LOW (ref 4.22–5.81)
RDW: 15 % (ref 11.5–15.5)
WBC: 25.8 10*3/uL — ABNORMAL HIGH (ref 4.0–10.5)
nRBC: 0.1 % (ref 0.0–0.2)

## 2020-09-03 MED ORDER — HEPARIN (PORCINE) 25000 UT/250ML-% IV SOLN
1450.0000 [IU]/h | INTRAVENOUS | Status: DC
  Administered 2020-09-03: 1400 [IU]/h via INTRAVENOUS
  Administered 2020-09-04 – 2020-09-05 (×2): 1500 [IU]/h via INTRAVENOUS
  Filled 2020-09-03 (×3): qty 250

## 2020-09-03 MED ORDER — LABETALOL HCL 200 MG PO TABS
200.0000 mg | ORAL_TABLET | Freq: Two times a day (BID) | ORAL | Status: DC
  Administered 2020-09-03 – 2020-09-12 (×17): 200 mg via ORAL
  Filled 2020-09-03 (×18): qty 1

## 2020-09-03 NOTE — Progress Notes (Signed)
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 Might be recovering GFR - No reason for RRT at this time, further will not req CRRT if develops as BPs have improved -abdominal ultrasound without obstruction - TFer to [facility] based on his ID/ortho issues  Severe sepsis -positive bcx for gpc x 2 (streptococcus) -positive ucx for e faecalis -likely related to LLE cellulitis and UTI, abx per primary service  Rhabdomyolysis -continue with fluids as above, trend CPK (improving slowly)  Hypotension, resolved -likely related to sepsis -home anti-hypertensives held -keep MAP>65  Metabolic acidosis, anion gap. Stable Stable HCO3 at 20  Chronic hypokalemia. 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 Was supposed to see endocrinology for this. Would be cautious with repletion if needed.  Normocytic Anemia -Transfuse for Hgb<7 g/dL. Avoid iron in the setting of bacteremia/acute infection   Subjective:     BPs improved  UOP picking up 1.0L  SCR 7.46, improved a tad, K 3.4, HCO3 20  +streptococcal bacteremia  CK improving  Plan is tfer to [facility]    Objective:   BP (!) 175/102   Pulse 88   Temp 97.7 F (36.5 C) (Oral)   Resp (!) 22   Ht 5\' 11"  (1.803 m)   Wt 118.1 kg   SpO2 100%   BMI 36.31 kg/m   Intake/Output Summary (Last 24 hours) at 09/03/2020 1251 Last data filed at 09/03/2020 1100 Gross per 24 hour  Intake 931.52 ml  Output 1575 ml  Net -643.48 ml   Weight change:   Physical Exam: Gen:NAD, pleasant, conversant CVS:reg rate Resp:cta bl, no w/r/r/c heard 11/01/2020, nt Ext:LLE edema (painful to touch), trace RLE edema Neuro: no  asterixis, speech clear and coherent, moves all ext spontaneously  Imaging: ECHOCARDIOGRAM COMPLETE  Result Date: 09/01/2020    ECHOCARDIOGRAM REPORT   Patient Name:   Connor [name] Date of Exam: 09/01/2020 Medical Rec #:  10/30/2020     Height:       71.0 in Accession #:    [id number]    Weight:       253.1 lb Date of Birth:  1956/05/01    BSA:          2.330 m Patient Age:    65 years      BP:           93/60 mmHg Patient Gender: M             HR:           83 bpm. Exam Location:  08/06/1957 Procedure: 2D Echo, Cardiac Doppler and Color Doppler Indications:    Bacteremia R78.81  History:        Patient has prior history of Echocardiogram examinations, most                 recent 10/02/2019. Arrythmias:Atrial Fibrillation; Risk                 Factors:Hypertension. History of open heart surgery (From Hx),                 History of aortic dissection s/p repair in 2016.  Sonographer:    2017 RCS Referring Phys: Celesta Gentile CHRISTOPHER P  DANFORD IMPRESSIONS  1. Left ventricular ejection fraction, by estimation, is 60 to 65%. The left ventricle has normal function. The left ventricle has no regional wall motion abnormalities. There is severe left ventricular hypertrophy. Left ventricular diastolic parameters  are consistent with Grade II diastolic dysfunction (pseudonormalization).  2. Right ventricular systolic function is normal. The right ventricular size is normal. There is normal pulmonary artery systolic pressure. The estimated right ventricular systolic pressure is 29.6 mmHg.  3. Left atrial size was severely dilated.  4. Right atrial size was moderately dilated.  5. The mitral valve is grossly normal. Trivial mitral valve regurgitation.  6. The aortic valve is tricuspid. Aortic valve regurgitation is not visualized.  7. The inferior vena cava is normal in size with greater than 50% respiratory variability, suggesting right atrial pressure of 3 mmHg. FINDINGS  Left Ventricle: Left ventricular ejection  fraction, by estimation, is 60 to 65%. The left ventricle has normal function. The left ventricle has no regional wall motion abnormalities. The left ventricular internal cavity size was normal in size. There is  severe left ventricular hypertrophy. Left ventricular diastolic parameters are consistent with Grade II diastolic dysfunction (pseudonormalization). Right Ventricle: The right ventricular size is normal. No increase in right ventricular wall thickness. Right ventricular systolic function is normal. There is normal pulmonary artery systolic pressure. The tricuspid regurgitant velocity is 2.58 m/s, and  with an assumed right atrial pressure of 3 mmHg, the estimated right ventricular systolic pressure is 29.6 mmHg. Left Atrium: Left atrial size was severely dilated. Right Atrium: Right atrial size was moderately dilated. Pericardium: There is no evidence of pericardial effusion. Mitral Valve: The mitral valve is grossly normal. Trivial mitral valve regurgitation. Tricuspid Valve: The tricuspid valve is grossly normal. Tricuspid valve regurgitation is trivial. Aortic Valve: The aortic valve is tricuspid. Aortic valve regurgitation is not visualized. Pulmonic Valve: The pulmonic valve was grossly normal. Pulmonic valve regurgitation is trivial. Aorta: The aortic root is normal in size and structure. Venous: The inferior vena cava is normal in size with greater than 50% respiratory variability, suggesting right atrial pressure of 3 mmHg. IAS/Shunts: No atrial level shunt detected by color flow Doppler.  LEFT VENTRICLE PLAX 2D LVIDd:         4.60 cm  Diastology LVIDs:         2.90 cm  LV e' medial:    7.94 cm/s LV PW:         1.70 cm  LV E/e' medial:  17.9 LV IVS:        1.80 cm  LV e' lateral:   12.20 cm/s LVOT diam:     2.00 cm  LV E/e' lateral: 11.6 LV SV:         95 LV SV Index:   41 LVOT Area:     3.14 cm  RIGHT VENTRICLE RV S prime:     15.00 cm/s TAPSE (M-mode): 2.2 cm LEFT ATRIUM              Index        RIGHT ATRIUM           Index LA diam:        4.70 cm  2.02 cm/m  RA Area:     28.20 cm LA Vol (A2C):   100.0 ml 42.91 ml/m RA Volume:   95.10 ml  40.81 ml/m LA Vol (A4C):   127.0 ml 54.50 ml/m LA Biplane Vol: 115.0 ml 49.35 ml/m  AORTIC VALVE LVOT Vmax:  144.00 cm/s LVOT Vmean:  106.000 cm/s LVOT VTI:    0.301 m  AORTA Ao Root diam: 3.30 cm MITRAL VALVE                TRICUSPID VALVE MV Area (PHT): 3.05 cm     TR Peak grad:   26.6 mmHg MV Decel Time: 249 msec     TR Vmax:        258.00 cm/s MV E velocity: 142.00 cm/s MV A velocity: 50.90 cm/s   SHUNTS MV E/A ratio:  2.79         Systemic VTI:  0.30 m                             Systemic Diam: 2.00 cm Nona Dell MD Electronically signed by Nona Dell MD Signature Date/Time: 09/01/2020/4:23:11 PM    Final     Labs: BMET Recent Labs  Lab 08/30/20 1947 08/31/20 1638 08/31/20 1912 09/01/20 0532 09/01/20 1538 09/02/20 0416 09/03/20 0419  NA 137 134*  --  135 137 138 138  K 2.8* 3.3*  --  3.9 3.3* 3.4* 3.4*  CL 103 102  --  102 102 103 103  CO2 22 20*  --  16* 20* 18* 20*  GLUCOSE 162* 143*  --  104* 101* 106* 107*  BUN 55* 54*  --  78* 84* 91* 99*  CREATININE 4.21* 4.56* 5.77* 6.51* 7.17* 7.61* 7.46*  CALCIUM 7.8* 7.3*  --  7.0* 7.1* 7.0* 7.6*  PHOS  --  3.5  --   --   --   --   --    CBC Recent Labs  Lab 08/30/20 1947 08/31/20 0414 09/01/20 0532 09/01/20 1538 09/02/20 0416 09/03/20 0419  WBC 15.6*   < > 21.4* 22.2* 22.8* 25.8*  NEUTROABS 13.3*  --   --   --   --   --   HGB 10.5*   < > 9.1* 8.3* 8.2* 8.2*  HCT 31.7*   < > 29.2* 25.3* 24.7* 23.7*  MCV 91.1   < > 98.6 91.7 88.5 86.2  PLT 136*   < > 187 206 269 406*   < > = values in this interval not displayed.    Medications:    . aspirin EC  81 mg Oral Daily  . Chlorhexidine Gluconate Cloth  6 each Topical Q0600  . feeding supplement  237 mL Oral BID BM  . mouth rinse  15 mL Mouth Rinse BID      Arita Miss, MD  [facility] 09/03/2020,  12:51 PM

## 2020-09-03 NOTE — Progress Notes (Signed)
Confusion arose about the need for transport to [facility]. Confusion cleared and transport was performed by [facility]. Patient was able to eat and visit with out of town family before transport. Called report [facility] at [facility] who will be taking Patient in room 4n- 12,

## 2020-09-03 NOTE — Progress Notes (Signed)
ANTICOAGULATION CONSULT NOTE - Initial Consult  Pharmacy Consult for heparin Indication: atrial fibrillation  No Known Allergies  Patient Measurements: Height: 5\' 11"  (180.3 cm) Weight: 118.1 kg (260 lb 5.8 oz) IBW/kg (Calculated) : 75.3 Heparin Dosing Weight: 100kg  Vital Signs: Temp: 98.2 F (36.8 C) (01/08 1500) Temp Source: Axillary (01/08 1500) BP: 142/85 (01/08 1500) Pulse Rate: 107 (01/08 1600)  Labs: Recent Labs    09/01/20 0532 09/01/20 1538 09/02/20 0416 09/03/20 0419  HGB 9.1* 8.3* 8.2* 8.2*  HCT 29.2* 25.3* 24.7* 23.7*  PLT 187 206 269 406*  CREATININE 6.51* 7.17* 7.61* 7.46*  CKTOTAL 24,138*  --  16,946* 8,688*    Estimated Creatinine Clearance: 13.2 mL/min (A) (by C-G formula based on SCr of 7.46 mg/dL (H)).   Medical History: Past Medical History:  Diagnosis Date  . AAA (abdominal aortic aneurysm) (HCC)   . History of open heart surgery   . Hypertension   . Seroma due to trauma [facility])       Assessment: 64yom with Hx Afib on apixaban 5mg  bid pta.  Dose is correct for age, wt, renal function.  He is admitted and being treat for bacteremia.  Apixaban on hold for any possible needed procedures during acute illness.  Afib RVR being managed with amiodarone.  Will begin heparin drip.  Last apixabn dose 1/5am.  Apixaban may still be falsely elevating the heparin level.  Will check aptt and HL to determine appropriate labs for monitoring and doing heparin.    Goal of Therapy:  Heparin level 0.3-0.7 units/ml Monitor platelets by anticoagulation protocol: Yes   Plan:  No bolus Heparin drip 1400 uts/hr Aptt / HL in 6hr from start of heaprin Daily HL/appt, CBC Monitor s/s bleeding   [facility] Pharm.D. CPP, BCPS Clinical Pharmacist [phone] 09/03/2020 4:47 PM

## 2020-09-03 NOTE — Progress Notes (Addendum)
[facility] Note  Patient seen and evaluated this afternoon after arrival from [facility].  Patient reports no significant complaints.  He reports his leg pain is improving.  Vitals:   09/03/20 1500 09/03/20 1600  BP: (!) 142/85   Pulse: (!) 114 (!) 107  Resp: 15 16  Temp: 98.2 F (36.8 C)   SpO2: 100% 98%    Cardiac: Irregular rate and rhythm systolic murmur best appreciated at the left upper sternal border with radiation throughout the precordium.  Lungs: Clear bilaterally to ascultation.  Abdomen: Normoactive bowel sounds. No tenderness to deep or light palpation. No rebound or guarding.   Lower extremities examined.  He has significant lower extremity edema of the left leg and trace of the right.  There is overlying blisters and significant erythema and hyperpigmentation of the left lower extremity he has some mild pain with palpation no significant pain it is slightly warm to touch. Psych: Pleasant and appropriate   Microbiology reviewed.  Covid negative.  Strep species in blood, group G strep.  Urine culture growing 1000 colony-forming units IV faecalis.  Is currently on ceftriaxone  Labs reviewed notable for marked leukocytosis of white blood cell count of 25, downtrending procalcitonin, downtrending CK, hemoglobin relatively stable at 8.2.  Thrombocytosis.  BMP notable for mild hypokalemia and a creatinine of 7.46.Marland Kitchen  Severe sepsis due to left lower extremity mild fasciitis with group B strep bacteremia in the setting of an aortic graft status post multiple revisions. -Repeat blood cultures this afternoon -Continue ceftriaxone -Discussed with pharmacy will discuss with ID pharmacist as well.  Plan to consult infectious disease on January 9. -Discussed case with cardiology this afternoon they will plan for TEE potentially on Monday.  Atrial fibrillation, currently rate controlled he is on an amiodarone drip.  Documentation suggested he was on apixaban, last  dose 1/5.  Blood count is stable no evidence of bleeding will start heparin drip.  Continue amiodarone IV infusion, start home labetolol given BP (elevated).   Acute kidney injury secondary to likely rhabdomyolysis and severe sepsis, monitor strict I's and O's, bladder scan.  Patient reports he is voiding well.  Nephrology was consulted and any pending.  Avoid nephrotoxic agents.  History of aortic dissection, patient had a type B aortic dissection in 2016 and underwent repair.  He then had dissection along anastomosis suture lines in 2019 and underwent a total arch repair in 2020. He is at high risk for infection (previously had Klebsiella infection of sternal wound?- all in [place]) - Pending clinical course, may need Vascular surgery consultation (follows with Dr. Arbie Cookey)  - Restart labetalol - Heparin drip for atrial fibrillation

## 2020-09-03 NOTE — Progress Notes (Signed)
PROGRESS NOTE  Connor [name]  DOB: [birth year]  PCP: Patient, No Pcp Per JME:[id number]  DOA: 08/30/2020  LOS: 4 days   Chief Complaint  Patient presents with  . Fever    Brief narrative: Connor [name] is a 65 y.o. male with PMH significant for HTN, CKD 3, A. fib on Eliquis, CAD s/p CABG, AAA, history of ascending aortic dissection status post open repair in 2016 with redo in 2019 as well as stenting to left common and external iliac in 2020 with course complicated by cardiogenic shock and acute kidney injury requiring short-term dialysis.  Patient presented to the ED on 1/4 with 1 week history of nausea, vomiting, chills followed by worsening left leg pain and swelling.  In the ED, he was noted to be in severe sepsis with WBC count elevated to 15, lactic acid 2, creatinine 4.2 with a baseline of 1.3, potassium low at 2.8.  CK level was elevated more than 28,000. Ultrasound rule out DVT.   CT scan of left leg showed soft tissue swelling but no muscle damage and no signs of abscess although limited due to lack of contrast and CT due to renal function.  MRI noted signs of myofascitis.   Patient was admitted to hospitalist service for cellulitis, rhabdomyolysis, AKI. Orthopedic and nephrology consultation were obtained. Blood cultures sent on admission grew Streptococcus group G, cellulitis likely being the source.    Over the course of hospitalization, sepsis, rhabdomyolysis are improving but renal function is not yet improving. Per nephrology recommendation, patient is being transferred to [facility].  Subjective: Patient was seen and examined this morning.  Pleasant middle-aged African-American male, lying on bed.  Able to wake up on calling his name, fluent.  Says he feels a little disoriented and giving the details of history. Not in distress.  Assessment/Plan: Severe sepsis - POA Left leg cellulitis/myofascitis Bacteremia with Streptococcus group G -Gradually improving  left leg cellulitis, lactic acid normalized, procalcitonin improving.  But WBC count tends to be rising up.  Temperature spikes low, 1 episode of 101 in last 24 hours.  Currently on IV Rocephin.  Continue the same. -Echocardiogram 1/6 did not comment on any evidence of vegetation.  If continues to have fever, leukocytosis or positive blood culture report, can consider TEE.  Low threshold of obtaining a TEE because of history of aortic graft placement after an aortic dissection. -I ordered for repeat blood culture for tomorrow morning lab. Recent Labs  Lab 08/30/20 1947 08/30/20 2215 08/31/20 0154 08/31/20 0414 09/01/20 0532 09/01/20 1026 09/01/20 1538 09/02/20 0416 09/03/20 0419  WBC 15.6*  --   --  18.3* 21.4*  --  22.2* 22.8* 25.8*  LATICACIDVEN 2.1* 2.4* 1.5 1.4  --  0.9  --   --   --   PROCALCITON  --   --   --   --   --  43.41  --  34.97 20.62   Acute kidney injury -Baseline creatinine less than 1.4.  Patient with elevated creatinine of 4.2, peaked at 7.6 on 1/7.  Seems better today at 7.4. -Continue to monitor.  Nephrology following. Recent Labs    12/25/19 1233 02/04/20 1415 04/29/20 1619 05/20/20 1615 08/30/20 1947 08/31/20 0414 08/31/20 1912 09/01/20 0532 09/01/20 1538 09/02/20 0416 09/03/20 0419  BUN 13 16 15 11  55* 54*  --  78* 84* 91* 99*  CREATININE 1.03 1.27 1.25 1.38* 4.21* 4.56* 5.77* 6.51* 7.17* 7.61* 7.46*   Rhabdomyolysis  -Secondary to left leg myofascitis -CK  level trending down as below with IV fluid. -Continue LR at 125 mill per hour. Recent Labs  Lab 08/31/20 1418 09/01/20 0532 09/02/20 0416 09/03/20 0419  CKTOTAL 73,220* 25,427* 06,237* 8,688*   Essential hypertension Chronic diastolic dysfunction -Home meds include labetalol 200 mg twice daily, nifedipine 90 mg daily, Aldactone 25 mg daily, valsartan 80 mg daily -Meds initially held because of sepsis.  Blood pressure is now rising, mostly over 170 overnight.  In light of elevated  creatinine, I would resume labetalol and nifedipine but keep Aldactone and valsartan on hold. -Echo from 1/6 with EF 60 to 65% and grade 2 diastolic dysfunction  Persistent A. Fib -Patient has history of A. fib and DC cardioversion. -On admission, he was noted to be in A. fib and was started on amiodarone drip. -Eliquis for anticoagulation.  History of cardiovascular disease -CAD s/p CABG, AAA, history of ascending aortic dissection status post open repair in 2016 with redo in 2019 as well as stenting to left common and external iliac in 2020 -Continue aspirin and Eliquis. ?  Not on a statin. -Obtain lipid panel  Transaminitis -Probably due to sepsis.  Improving. Recent Labs  Lab 08/30/20 1947 08/31/20 0414 09/01/20 0532 09/01/20 1538 09/02/20 0416  AST 279* 274* 220* 193* 173*  ALT 60* 58* 56* 54* 54*  ALKPHOS 69 59 75 77 84  BILITOT 3.0* 4.1* 3.2* 2.8* 2.3*  PROT 7.2 6.5 6.3* 6.1* 6.4*  ALBUMIN 2.8* 2.6* 2.5* 2.2* 2.3*   Hypokalemia -Level slightly low at 3.4 today.  Defer to nephrology because of coexisting AKI Recent Labs  Lab 08/31/20 0414 09/01/20 0532 09/01/20 1538 09/02/20 0416 09/03/20 0419  K 3.3* 3.9 3.3* 3.4* 3.4*  MG 2.0  --   --   --   --   PHOS 3.5  --   --   --   --    Hyperglycemia -No history of diabetes.  A1c 4.6 on 1/7. -Fingersticks consistently less than 150.  Mobility: Encourage ambulation Code Status:   Code Status: Full Code  Nutritional status: Body mass index is 36.31 kg/m.     Diet Order            Diet heart healthy/carb modified Room service appropriate? Yes; Fluid consistency: Thin  Diet effective now                 DVT prophylaxis: SCDs Start: 08/30/20 2133   Antimicrobials:  IV Rocephin Fluid: LR at 125 mL/h Consultants: Nephrology Family Communication:  None at bedside  Status is: Inpatient  Remains inpatient appropriate because: Ongoing work-up for sepsis, AKI Dispo: The patient is from: Home               Anticipated d/c is to: Home likely              Anticipated d/c date is: > 3 days              Patient currently is not medically stable to d/c.       Infusions:  . amiodarone 30 mg/hr (09/03/20 0337)  . cefTRIAXone (ROCEPHIN)  IV Stopped (09/02/20 2057)  . lactated ringers 125 mL/hr at 09/03/20 0601    Scheduled Meds: . aspirin EC  81 mg Oral Daily  . Chlorhexidine Gluconate Cloth  6 each Topical Q0600  . feeding supplement  237 mL Oral BID BM  . mouth rinse  15 mL Mouth Rinse BID    Antimicrobials: Anti-infectives (From admission, onward)   Start  Dose/Rate Route Frequency Ordered Stop   09/02/20 2200  cefTRIAXone (ROCEPHIN) 2 g in sodium chloride 0.9 % 100 mL IVPB        2 g 200 mL/hr over 30 Minutes Intravenous Every 24 hours 09/02/20 1044     09/01/20 2200  vancomycin (VANCOREADY) IVPB 1500 mg/300 mL  Status:  Discontinued        1,500 mg 150 mL/hr over 120 Minutes Intravenous Every 48 hours 08/31/20 0350 08/31/20 1719   09/01/20 0130  ceFEPIme (MAXIPIME) 2 g in sodium chloride 0.9 % 100 mL IVPB  Status:  Discontinued        2 g 200 mL/hr over 30 Minutes Intravenous Every 24 hours 08/31/20 1812 09/02/20 1043   09/01/20 0100  metroNIDAZOLE (FLAGYL) IVPB 500 mg  Status:  Discontinued        500 mg 100 mL/hr over 60 Minutes Intravenous Every 8 hours 08/31/20 1751 08/31/20 1926   08/31/20 2200  ceFEPIme (MAXIPIME) 2 g in sodium chloride 0.9 % 100 mL IVPB  Status:  Discontinued        2 g 200 mL/hr over 30 Minutes Intravenous Every 24 hours 08/31/20 0350 08/31/20 1719   08/31/20 2030  vancomycin (VANCOREADY) IVPB 1500 mg/300 mL        1,500 mg 150 mL/hr over 120 Minutes Intravenous  Once 08/31/20 2016 08/31/20 2338   08/31/20 2015  clindamycin (CLEOCIN) IVPB 900 mg  Status:  Discontinued        900 mg 100 mL/hr over 30 Minutes Intravenous Every 8 hours 08/31/20 1926 09/01/20 1033   08/31/20 1812  vancomycin variable dose per unstable renal function (pharmacist dosing)   Status:  Discontinued         Does not apply See admin instructions 08/31/20 1812 09/01/20 1033   08/31/20 1730  cefTRIAXone (ROCEPHIN) 2 g in sodium chloride 0.9 % 100 mL IVPB  Status:  Discontinued        2 g 200 mL/hr over 30 Minutes Intravenous Every 24 hours 08/31/20 1720 08/31/20 1751   08/31/20 1015  metroNIDAZOLE (FLAGYL) IVPB 500 mg  Status:  Discontinued        500 mg 100 mL/hr over 60 Minutes Intravenous Every 8 hours 08/31/20 1012 08/31/20 1719   08/31/20 0045  ceFEPIme (MAXIPIME) 2 g in sodium chloride 0.9 % 100 mL IVPB        2 g 200 mL/hr over 30 Minutes Intravenous  Once 08/31/20 0030 08/31/20 0208   08/31/20 0000  vancomycin (VANCOREADY) IVPB 1500 mg/300 mL        1,500 mg 150 mL/hr over 120 Minutes Intravenous  Once 08/30/20 2351 08/31/20 0208   08/30/20 1900  cefTRIAXone (ROCEPHIN) 2 g in sodium chloride 0.9 % 100 mL IVPB  Status:  Discontinued        2 g 200 mL/hr over 30 Minutes Intravenous Every 24 hours 08/30/20 1855 08/31/20 0030      PRN meds: HYDROmorphone (DILAUDID) injection, ondansetron (ZOFRAN) IV, oxyCODONE   Objective: Vitals:   09/03/20 0530 09/03/20 0808  BP: (!) 175/102   Pulse:    Resp: (!) 22   Temp:  98.3 F (36.8 C)  SpO2:      Intake/Output Summary (Last 24 hours) at 09/03/2020 0942 Last data filed at 09/03/2020 0808 Gross per 24 hour  Intake 1335.04 ml  Output 950 ml  Net 385.04 ml   Filed Weights   08/31/20 1156 09/01/20 0540 09/03/20 0403  Weight: 114.8 kg 114.8 kg  118.1 kg   Weight change:  Body mass index is 36.31 kg/m.   Physical Exam: General exam: Pleasant middle-aged African-American male. Skin: No rashes, lesions or ulcers. HEENT: Atraumatic, normocephalic, no obvious bleeding Lungs: Clear to auscultation bilaterally CVS: Regular rate and rhythm, no murmur GI/Abd soft, nontender, nondistended, bowel sound present CNS: Alert, awake, knows he is in the hospital, confused and some details of the history.  Not restless  or agitated Psychiatry: Mood appropriate Extremities: Left leg swelling remains with, probably improving as I see skin wrinkling  Data Review: I have personally reviewed the laboratory data and studies available.  Recent Labs  Lab 08/30/20 1947 08/31/20 0414 09/01/20 0532 09/01/20 1538 09/02/20 0416 09/03/20 0419  WBC 15.6* 18.3* 21.4* 22.2* 22.8* 25.8*  NEUTROABS 13.3*  --   --   --   --   --   HGB 10.5* 10.1* 9.1* 8.3* 8.2* 8.2*  HCT 31.7* 31.0* 29.2* 25.3* 24.7* 23.7*  MCV 91.1 92.5 98.6 91.7 88.5 86.2  PLT 136* 137* 187 206 269 406*   Recent Labs  Lab 08/31/20 0414 08/31/20 1912 09/01/20 0532 09/01/20 1538 09/02/20 0416 09/03/20 0419  NA 134*  --  135 137 138 138  K 3.3*  --  3.9 3.3* 3.4* 3.4*  CL 102  --  102 102 103 103  CO2 20*  --  16* 20* 18* 20*  GLUCOSE 143*  --  104* 101* 106* 107*  BUN 54*  --  78* 84* 91* 99*  CREATININE 4.56* 5.77* 6.51* 7.17* 7.61* 7.46*  CALCIUM 7.3*  --  7.0* 7.1* 7.0* 7.6*  MG 2.0  --   --   --   --   --   PHOS 3.5  --   --   --   --   --     F/u labs ordered  Signed, Lorin GlassBinaya Alex Leahy, MD [facility] 09/03/2020

## 2020-09-04 ENCOUNTER — Encounter (HOSPITAL_COMMUNITY): Payer: Self-pay | Admitting: Internal Medicine

## 2020-09-04 DIAGNOSIS — A409 Streptococcal sepsis, unspecified: Secondary | ICD-10-CM | POA: Diagnosis not present

## 2020-09-04 DIAGNOSIS — I4819 Other persistent atrial fibrillation: Secondary | ICD-10-CM | POA: Diagnosis not present

## 2020-09-04 DIAGNOSIS — R6520 Severe sepsis without septic shock: Secondary | ICD-10-CM | POA: Diagnosis not present

## 2020-09-04 LAB — CBC
HCT: 19.6 % — ABNORMAL LOW (ref 39.0–52.0)
Hemoglobin: 7.1 g/dL — ABNORMAL LOW (ref 13.0–17.0)
MCH: 30.1 pg (ref 26.0–34.0)
MCHC: 36.2 g/dL — ABNORMAL HIGH (ref 30.0–36.0)
MCV: 83.1 fL (ref 80.0–100.0)
Platelets: 510 10*3/uL — ABNORMAL HIGH (ref 150–400)
RBC: 2.36 MIL/uL — ABNORMAL LOW (ref 4.22–5.81)
RDW: 15.2 % (ref 11.5–15.5)
WBC: 20.4 10*3/uL — ABNORMAL HIGH (ref 4.0–10.5)
nRBC: 0 % (ref 0.0–0.2)

## 2020-09-04 LAB — LIPID PANEL
Cholesterol: 110 mg/dL (ref 0–200)
HDL: <10 mg/dL — ABNORMAL LOW (ref >40)
Triglycerides: 173 mg/dL — ABNORMAL HIGH (ref <150)
VLDL: 35 mg/dL (ref 0–40)

## 2020-09-04 LAB — HEPATIC FUNCTION PANEL
ALT: 45 U/L — ABNORMAL HIGH (ref 0–44)
AST: 75 U/L — ABNORMAL HIGH (ref 15–41)
Albumin: 1.9 g/dL — ABNORMAL LOW (ref 3.5–5.0)
Alkaline Phosphatase: 80 U/L (ref 38–126)
Bilirubin, Direct: 0.5 mg/dL — ABNORMAL HIGH (ref 0.0–0.2)
Indirect Bilirubin: 0.8 mg/dL (ref 0.3–0.9)
Total Bilirubin: 1.3 mg/dL — ABNORMAL HIGH (ref 0.3–1.2)
Total Protein: 5.7 g/dL — ABNORMAL LOW (ref 6.5–8.1)

## 2020-09-04 LAB — BASIC METABOLIC PANEL
Anion gap: 14 (ref 5–15)
BUN: 87 mg/dL — ABNORMAL HIGH (ref 8–23)
CO2: 21 mmol/L — ABNORMAL LOW (ref 22–32)
Calcium: 7.6 mg/dL — ABNORMAL LOW (ref 8.9–10.3)
Chloride: 107 mmol/L (ref 98–111)
Creatinine, Ser: 5.79 mg/dL — ABNORMAL HIGH (ref 0.61–1.24)
GFR, Estimated: 10 mL/min — ABNORMAL LOW (ref >60)
Glucose, Bld: 124 mg/dL — ABNORMAL HIGH (ref 70–99)
Potassium: 3 mmol/L — ABNORMAL LOW (ref 3.5–5.1)
Sodium: 142 mmol/L (ref 135–145)

## 2020-09-04 LAB — HEPARIN LEVEL (UNFRACTIONATED): Heparin Unfractionated: 0.3 IU/mL (ref 0.30–0.70)

## 2020-09-04 LAB — PROCALCITONIN: Procalcitonin: 7.47 ng/mL

## 2020-09-04 LAB — GLUCOSE, CAPILLARY: Glucose-Capillary: 104 mg/dL — ABNORMAL HIGH (ref 70–99)

## 2020-09-04 LAB — CK: Total CK: 3244 U/L — ABNORMAL HIGH (ref 49–397)

## 2020-09-04 LAB — APTT
aPTT: 54 seconds — ABNORMAL HIGH (ref 24–36)
aPTT: 77 seconds — ABNORMAL HIGH (ref 24–36)

## 2020-09-04 MED ORDER — SODIUM CHLORIDE 0.9% FLUSH: 10.0000 mL | INTRAVENOUS | Status: DC | PRN

## 2020-09-04 MED ORDER — SODIUM CHLORIDE 0.9% FLUSH
10.0000 mL | Freq: Two times a day (BID) | INTRAVENOUS | Status: DC
  Administered 2020-09-04 – 2020-09-19 (×22): 10 mL

## 2020-09-04 NOTE — Consult Note (Addendum)
Cardiology Consult    Patient ID: [name]; [id number]; 01-31-1956   Admit date: 08/30/2020 Date of Consult: 09/04/2020  Primary Care Provider: Patient, No Pcp Per Primary Cardiologist: Little Ishikawa, MD   Patient Profile    Connor [name] is a 65 y.o. 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 and had dissection at anastomosis suture lines in 07/2018 and course was complicated by cardiogenic shock with EF at 15% and ultimately underwent total arch replacement in 09/2018 which was performed in [place]), history of cardiomyopathy (EF 15% in 2019 in the setting of cardiogenic shock, normalized since by repeat imaging), HTN, paroxysmal atrial fibrillation (on Eliquis and underwent DCCV in 01/2020 with return to NSR but was back in atrial fibrillation at follow-up in 04/2020) and history of DVT who is being seen today for the evaluation of atrial fibrillation and possible TEE at the request of Dr. Pola Corn.   History of Present Illness    [name] presented to [facility] ED on 08/30/2020 for evaluation of vomiting and diarrhea for the past 5 days leading to admission and he initially thought this was secondary to food poisoning. He was found to be febrile to 102.7 while in the ED and Lactic Acid was elevated to 2.4. Was admitted for sepsis secondary to possibly acute infectious GI illness versus due to left leg cellulitis. Also had an AKI on admission with creatinine at 4.21 on 1/4 (previousoly 1.38 in 04/2020) and elevated LFT's (AST 279 and ALT 60 on admission). HS Troponin values were elevated to 425 and 482 but felt to be most consistent with demand ischemia due to his acute illness and not represent ACS. Initial EKG did show atrial fibrillation with RVR, HR 158 with nonspecific ST abnormalities along the lateral leads. Was started on IV Amiodarone for rate-control along with being continued on PTA Labetalol 200mg  BID. A repeat echo was obtained and  showed a preserved EF of 60-65% with Grade 2 DD, normal RV function, biatrial dilation, and trivial MR.   Blood cultures did result positive for streptococcus and urine culture for Enterococcus faecalis. He was initially responding well to IV antibiotics but given his left leg pain/swelling and CK > 28,000, Orthopedics was consulted for evaluation of compartment syndrome but was not felt to be consistent with this. MRI of his left femur on 1/6 showed mild diffuse soft tissue swelling but was felt to have changes of myofasciitis mainly involving the medial compartment /adductor compartment muscles. He has continued to be followed by Nephrology as well given his AKI felt to be secondary to acute tubular injury and decreased renal perfusion in the setting of rhabdomyolysis. Creatinine peaked at 7.61 on 1/7, was at 7.46 on 1/8 with repeat BMET pending for today (lab confirmed they were unsuccessful with sticks earlier today). He was transferred to [facility] on 1/8 for ID evaluation and in case he required any surgical intervention for his myofascitis. Cardiology was consulted for atrial fibrillation and a possible TEE for bacteriemia.   In talking with the patient today, he continues to have pain along his left leg but says this is improving. Denies any chest pain or palpitations. Does report he could feel his heart racing at the time of admission. No recent orthopnea or PND. Did not have issues with edema until a few days prior to admission.    Past Medical History:  Diagnosis Date  . AAA (abdominal aortic aneurysm) (HCC)   .  History of open heart surgery   . Hypertension   . Seroma due to trauma [facility](HCC)     Past Surgical History:  Procedure Laterality Date  . ABDOMINAL AORTIC ANEURYSM REPAIR    . CARDIOVERSION N/A 02/11/2020   Procedure: CARDIOVERSION;  Surgeon: Little IshikawaSchumann, Christopher L, MD;  [facility];  Service: Cardiovascular;  Laterality: N/A;  . CORONARY ARTERY BYPASS GRAFT        Home Medications:  Prior to Admission medications   Medication Sig Start Date End Date Taking? Authorizing Provider  aspirin EC 81 MG tablet Take 81 mg by mouth daily. Swallow whole.   Yes [provider]  ELIQUIS 5 MG TABS tablet Take 1 tablet (5 mg total) by mouth 2 (two) times daily. 08/11/19  Yes Little IshikawaSchumann, Christopher L, MD  labetalol (NORMODYNE) 200 MG tablet Take 2 tablets (400 mg total) by mouth 2 (two) times daily. Patient taking differently: Take 200 mg by mouth 2 (two) times daily. 09/08/19  Yes Little IshikawaSchumann, Christopher L, MD  Multiple Vitamins-Minerals (MULTIVITAMIN WITH MINERALS) tablet Take 1 tablet by mouth daily. Men   Yes [provider]  NIFEdipine (ADALAT CC) 90 MG 24 hr tablet TAKE 1 TABLET(90 MG) BY MOUTH DAILY Patient taking differently: Take 90 mg by mouth daily. 06/17/20  Yes Little IshikawaSchumann, Christopher L, MD  spironolactone (ALDACTONE) 25 MG tablet TAKE 1/2 TABLET(12.5 MG) BY MOUTH DAILY Patient taking differently: Take 25 mg by mouth daily. 07/07/20  Yes Little IshikawaSchumann, Christopher L, MD  valsartan (DIOVAN) 80 MG tablet Take 1 tablet (80 mg total) by mouth daily. 11/20/19  Yes Little IshikawaSchumann, Christopher L, MD    Inpatient Medications: Scheduled Meds: . aspirin EC  81 mg Oral Daily  . Chlorhexidine Gluconate Cloth  6 each Topical Q0600  . feeding supplement  237 mL Oral BID BM  . labetalol  200 mg Oral BID  . mouth rinse  15 mL Mouth Rinse BID   Continuous Infusions: . amiodarone 30 mg/hr (09/04/20 0600)  . cefTRIAXone (ROCEPHIN)  IV 200 mL/hr at 09/04/20 0600  . heparin 1,500 Units/hr (09/04/20 0600)  . lactated ringers 125 mL/hr at 09/04/20 0600   PRN Meds: HYDROmorphone (DILAUDID) injection, ondansetron (ZOFRAN) IV, oxyCODONE  Allergies:   No Known Allergies  Social History:   Social History   Socioeconomic History  . Marital status: Single    Spouse name: Not on file  . Number of children: Not on file  . Years of education: Not on file  . Highest  education level: Not on file  Occupational History  . Not on file  Tobacco Use  . Smoking status: Current Every Day Smoker    Packs/day: 0.50  . Smokeless tobacco: Never Used  Vaping Use  . Vaping Use: Never used  Substance and Sexual Activity  . Alcohol use: Never  . Drug use: Never  . Sexual activity: Not on file  Other Topics Concern  . Not on file  Social History Narrative  . Not on file   Social Determinants of Health   Financial Resource Strain: Not on file  Food Insecurity: Not on file  Transportation Needs: Not on file  Physical Activity: Not on file  Stress: Not on file  Social Connections: Not on file  Intimate Partner Violence: Not on file     Family History:    Family History  Problem Relation Age of Onset  . CAD Mother   . CVA Father       Review of Systems  General:  No chills, fever, night sweats or weight changes.  Cardiovascular:  No chest pain, dyspnea on exertion, orthopnea,  paroxysmal nocturnal dyspnea. Positive for edema and palpitations.  Dermatological: No rash, lesions/masses Respiratory: No cough, dyspnea Urologic: No hematuria, dysuria Abdominal:   No nausea, bright red blood per rectum, melena, or hematemesis. Positive for vomiting and diarrhea.  Neurologic:  No visual changes, wkns, changes in mental status. All other systems reviewed and are otherwise negative except as noted above.  Physical Exam/Data    Vitals:   09/04/20 0200 09/04/20 0300 09/04/20 0400 09/04/20 0740  BP: 110/66 (!) 99/57 (!) 143/68 (!) 174/77  Pulse: 84 87 86 84  Resp: (!) 21 18 (!) 21 20  Temp:   98.4 F (36.9 C) 98.4 F (36.9 C)  TempSrc:   Oral   SpO2: 98% (!) 64% 98% 98%  Weight:      Height:        Intake/Output Summary (Last 24 hours) at 09/04/2020 0858 Last data filed at 09/04/2020 0600 Gross per 24 hour  Intake 4082.71 ml  Output 3355 ml  Net 727.71 ml   Filed Weights   09/01/20 0540 09/03/20 0403 09/03/20 1500  Weight: 114.8 kg 118.1 kg  112.4 kg   Body mass index is 34.56 kg/m.   General: Pleasant male appearing in NAD Psych: Normal affect. Neuro: Alert and oriented X 3. Moves all extremities spontaneously. HEENT: Normal  Neck: Supple without bruits or JVD. Lungs:  Resp regular and unlabored, CTA without wheezing or rales. Heart: Irregularly irregular. No s3, s4, or murmurs. Abdomen: Soft, non-tender, non-distended, BS + x 4.  Extremities: No clubbing. Significant swelling along LLE with dressing in place.  DP/PT/Radials 2+ and equal bilaterally.   EKG:  The EKG was personally reviewed and demonstrates: Atrial fibrillation with RVR, HR 158 with nonspecific ST abnormalities along the lateral leads  Telemetry:  Telemetry was personally reviewed and demonstrates: Atrial fibrillation, HR in 70's to 80's with occasional PVC's.    Labs/Studies     Relevant CV Studies:  Echocardiogram: 09/01/2020 IMPRESSIONS    1. Left ventricular ejection fraction, by estimation, is 60 to 65%. The  left ventricle has normal function. The left ventricle has no regional  wall motion abnormalities. There is severe left ventricular hypertrophy.  Left ventricular diastolic parameters  are consistent with Grade II diastolic dysfunction (pseudonormalization).  2. Right ventricular systolic function is normal. The right ventricular  size is normal. There is normal pulmonary artery systolic pressure. The  estimated right ventricular systolic pressure is 29.6 mmHg.  3. Left atrial size was severely dilated.  4. Right atrial size was moderately dilated.  5. The mitral valve is grossly normal. Trivial mitral valve  regurgitation.  6. The aortic valve is tricuspid. Aortic valve regurgitation is not  visualized.  7. The inferior vena cava is normal in size with greater than 50%  respiratory variability, suggesting right atrial pressure of 3 mmHg.   Laboratory Data:  Chemistry Recent Labs  Lab 09/01/20 1538 09/02/20 0416  09/03/20 0419  NA 137 138 138  K 3.3* 3.4* 3.4*  CL 102 103 103  CO2 20* 18* 20*  GLUCOSE 101* 106* 107*  BUN 84* 91* 99*  CREATININE 7.17* 7.61* 7.46*  CALCIUM 7.1* 7.0* 7.6*  GFRNONAA 8* 7* 8*  ANIONGAP 15 17* 15    Recent Labs  Lab 09/01/20 0532 09/01/20 1538 09/02/20 0416  PROT 6.3* 6.1* 6.4*  ALBUMIN 2.5* 2.2* 2.3*  AST 220* 193*  173*  ALT 56* 54* 54*  ALKPHOS 75 77 84  BILITOT 3.2* 2.8* 2.3*   Hematology Recent Labs  Lab 09/01/20 1538 09/02/20 0416 09/03/20 0419  WBC 22.2* 22.8* 25.8*  RBC 2.76* 2.79* 2.75*  HGB 8.3* 8.2* 8.2*  HCT 25.3* 24.7* 23.7*  MCV 91.7 88.5 86.2  MCH 30.1 29.4 29.8  MCHC 32.8 33.2 34.6  RDW 14.9 14.6 15.0  PLT 206 269 406*   Cardiac EnzymesNo results for input(s): TROPONINI in the last 168 hours. No results for input(s): TROPIPOC in the last 168 hours.  BNPNo results for input(s): BNP, PROBNP in the last 168 hours.  DDimer No results for input(s): DDIMER in the last 168 hours.  Radiology/Studies:  US Abdomen Complete  Result Date: 09/01/2020 CLINICAL DATA:  Elevated liver enzymes. History of aortic aneurysm and dissection. EXAM: ABDOMEN ULTRASOUND COMPLETE COMPARISON:  None. FINDINGS: Gallbladder: Within the gallbladder, there are echogenic foci which move and shadow consistent with cholelithiasis. Largest gallstone measures 5 mm in length. There is no appreciable gallbladder wall thickening or pericholecystic fluid. No sonographic Murphy sign noted by sonographer. Common bile duct: Diameter: 8 mm proximally, prominent. There is slight intrahepatic biliary duct dilatation. No biliary duct obstructing lesion is evident. Note that portions of the mid to distal common bile duct are obscured by gas. Liver: No focal lesion identified. Liver echogenicity overall is increased. Portal vein is patent on color Doppler imaging with normal direction of blood flow towards the liver. IVC: No abnormality visualized in portions that can be interrogated.  Note that portions of the inferior vena cava obscured by gas. Pancreas: Most of the pancreas is obscured by gas. Spleen: Size and appearance within normal limits. Right Kidney: Length: 12.1 cm. Echogenicity within normal limits. No mass or hydronephrosis visualized. Left Kidney: Length: 12.8 cm. Echogenicity within normal limits. No mass or hydronephrosis visualized. Abdominal aorta: Proximal abdominal aorta measures 5.0 cm in diameter, stable from prior CT examination. Most of the remainder of the aorta is obscured by gas. The known dissection in this area is not appreciable on current sonographic evaluation. Other findings: No demonstrable ascites. IMPRESSION: 1. Cholelithiasis. No gallbladder wall thickening or pericholecystic fluid. 2. Prominence of the proximal common bile duct as well as mild intrahepatic biliary duct dilatation. No obstructing focus seen in the biliary ductal system. Note that portions of the mid to distal common bile duct obscured by gas. From an imaging standpoint, MRCP would be the optimum study of choice to further evaluate the biliary ductal system. 3. Proximal abdominal aortic aneurysm measuring approximately 5 cm in diameter, stable from prior CT examination. Most of the aorta is obscured by gas. The known dissection in this area of the aorta not appreciable by ultrasound. 4.  Most of pancreas and much of inferior vena cava obscured by gas. 5. Increase in liver echogenicity which may indicate hepatic steatosis with potential underlying parenchymal liver disease. No focal liver lesions are demonstrable on this study. Electronically Signed   By: Bretta Bang III M.D.   On: 09/01/2020 10:49   MR FEMUR LEFT WO CONTRAST  Result Date: 09/01/2020 CLINICAL DATA:  Left lower extremity pain and swelling. EXAM: MR OF THE LEFT FEMUR WITHOUT CONTRAST TECHNIQUE: Multiplanar, multisequence MR imaging of the left lower extremity was performed. No intravenous contrast was administered.  COMPARISON:  CT scan 08/31/2020 FINDINGS: Both hips are normally located. Mild degenerative changes but no findings suspicious for septic arthritis or osteomyelitis. The left femur is intact. No evidence of  osteomyelitis. Mild diffuse subcutaneous soft tissue swelling/edema/fluid suggesting cellulitis. Again demonstrated are enlarged and inflamed appearing left inguinal lymph nodes, likely a lymph adenitis. No discrete drainable subcutaneous soft tissue abscess is identified although study somewhat limited without IV contrast. Changes of myofasciitis mainly involving the medial compartment/adductor compartment muscles. No findings suspicious for pyomyositis. Similar findings involving the right upper thigh. Stable postoperative changes involving the left inguinal area with scar tissue. The major vascular structures demonstrate patent flow voids. Stable borderline enlarged left external iliac lymph node measuring 15.5 mm. No significant intrapelvic abnormalities are identified. IMPRESSION: 1. Mild diffuse subcutaneous soft tissue swelling/edema/fluid suggesting cellulitis. 2. Changes of myofasciitis mainly involving the medial compartment/adductor compartment muscles. No evidence of pyomyositis. 3. No findings suspicious for septic arthritis or osteomyelitis. 4. Stable enlarged and inflamed appearing left inguinal and left external iliac lymph nodes. Electronically Signed   By: Rudie Meyer M.D.   On: 09/01/2020 09:38    CT EXTREMITY LOWER LEFT WO CONTRAST  Result Date: 08/31/2020 CLINICAL DATA:  Edema and swelling over left lower extremity EXAM: CT OF THE LOWER LEFT EXTREMITY WITHOUT CONTRAST TECHNIQUE: Multidetector CT imaging of the lower left extremity was performed according to the standard protocol. COMPARISON:  Ultrasound 08/30/2020, CT angiography 03/01/2020, 06/19/2019 FINDINGS: Bones/Joint/Cartilage No fracture or malalignment. No periostitis or bony destructive change. No significant hip or knee  effusion is visualized. Ligaments Suboptimally assessed by CT. Muscles and Tendons No significant atrophy.  No intramuscular fluid collections. Soft tissues Mild vascular calcifications. Focal distortion within the left groin with surgical clips, unchanged and presumably due to surgical scarring. Multiple enlarged left inguinal lymph nodes, the largest measures 3.01 2.7 by 2.5 cm and corresponds to the ultrasound demonstrated abnormal lymph node. Moderate subcutaneous edema and fluid within the left lower extremity extending from the hip to the imaged proximal lower leg. Edema most heavily concentrated at the distal thigh and knee. Skin thickening and edematous infiltration of subcutaneous fat of the anterior, medial, and lateral thigh with small amount of fluid superficial to the lateral quadriceps muscles. No gas containing fluid collections. IMPRESSION: 1. Moderate subcutaneous edema within the left lower extremity extending from the hip to the imaged proximal lower leg, possible cellulitis or nonspecific lower extremity edema. No gas containing fluid collections are seen to suggest soft tissue abscess. Note that assessment for abscess is limited without contrast. There is no acute osseous abnormality. 2. Multiple enlarged left inguinal lymph nodes, the largest measuring up to 3 cm, corresponding to the ultrasound demonstrated abnormal lymph node on recent ultrasound. Findings are indeterminate for reactive adenopathy or malignant adenopathy. Tissue sampling was previously suggested. 3. No acute osseous abnormality. Electronically Signed   By: Jasmine Pang M.D.   On: 08/31/2020 21:18     Assessment & Plan    1. Persistent Atrial Fibrillation - Presented in atrial fibrillation with RVR but suspect this was secondary to his acute illness. Previously underwent DCCV in 01/2020 and initially successful but recurrence at follow-up in 04/2020. Was referred to the Atrial Fibrillation Clinic but did not follow-up  as he reports he temporarily lost insurance coverage.  - Given his severe LA dilation by most recent echocardiogram, would anticipate a rate-control strategy will be pursued.  - Rates are currently well-controlled in the 70's to 80's. He is currently receiving IV Amiodarone and Labetalol 200mg  BID. Suspect IV Amiodarone can be discontinued and Labetalol can be titrated. Will review further with MD.  - This patients CHA2DS2-VASc Score and unadjusted Ischemic Stroke  Rate (% per year) is equal to 2.2 % stroke rate/year from a score of 2 (HTN, PAD). He was on Eliquis prior to admission which is currently held in case he requires any surgical intervention. Currently on IV Heparin.    2. Sepsis in the setting of Streptococcus bacteremia and Left leg myofascitis - Blood cultures are positive for streptococcus. Remains on Rocephin. Hospitalist notes mentioned possible TEE but it does not appear ID has consulted on the patient yet. We did review a TEE but unsure about the timing of this given his multiple medical issues. After careful review of history and examination, the risks and benefits of transesophageal echocardiogram have been explained including risks of esophageal damage, perforation (1:10,000 risk), bleeding, pharyngeal hematoma as well as other potential complications associated with conscious sedation including aspiration, arrhythmia, respiratory failure and death. Alternatives to treatment were discussed, questions were answered. Patient is willing to proceed.   3. Rhabdomyolysis - CK peaked at > 28,000 and was improved to 8688 by repeat labs on 09/03/2020. - Receiving IVF with LR at 125 mL/hr. Further management per admitting team.   4. History of Type A Aortic Arch Dissection - She is s/p repair in 2016 and had dissection at anastomosis suture lines in 07/2018 and course was complicated by cardiogenic shock with EF at 15% and ultimately underwent total arch replacement in 09/2018 which was  performed in [place]. - His BP is currently above goal and given several of his PTA medications are held in the setting of his AKI, would recommend titration of Labetalol or adding Hydralazine.   4. History of Cardiomyopathy - His EF was previously 15% in 2019 in the setting of cardiogenic shock, normalized since by repeat imaging. Repeat echo this admission shows a preserved EF with Grade 2 DD and no regional WMA as outlined above.  - Remains on IVF at this time in the setting of Rhabdomyolysis.   5. Elevated LFT's - Improving. Most recent CMET on 09/02/2020 showed AST 173 and ALT 54.  6. AKI - Felt to be secondary to acute tubular injury and decreased renal perfusion in the setting of rhabdomyolysis. PTA Spironolactone and Valsartan currently held.  - Creatinine peaked at 7.61 on 1/7, was at 7.46 on 1/8 with repeat BMET pending for today. Further management per Neurology.    For questions or updates, please contact [facility] Please consult [email or web address] for contact info under Cardiology/STEMI.  Signed, Ellsworth LennoxBrittany M Strader, PA-C 09/04/2020, 8:58 AM Pager: [phone]  Cardiology attending  Patient seen and examined.  Agree with the findings as noted above.  The patient has an extensive past medical history including aortic dissection status post repair twice.  He has a history of left ventricular dysfunction which normalized.  The patient presented to the hospital several days ago with diarrhea and fever and leg pain and was subsequently found to have mild fasciitis, as well as bacteremia and sepsis secondary to Streptococcus.  He has been treated with intravenous antibiotics.  He has had atrial fibrillation which is not new and with a controlled ventricular rate.  He has been treated with intravenous amiodarone and beta-blockers for rate control.  There is been a concern of possible endocarditis and cardiology is referred for evaluation.  A 2D echo was obtained 3 days ago demonstrating  preserved left ventricular systolic function as well as right ventricular systolic function and dilation of both atria, left more than right.  His valves were not well visualized. His exam is as noted above,  notable for fairly severe left lower leg swelling all the way up to the knee with draining wound.  His legs are both warm.  Assessment and plan 1.  Sepsis with strep bacteremia -I think it is reasonable to pursue transesophageal echo to rule out endocarditis.  There is no evidence of endocarditis on transthoracic echo but his valves were not well visualized.  A transesophageal echo will be scheduled. 2.  Atrial fibrillation -his ventricular rates are well controlled on medical therapy.  No change.  Once he is adequately treated, he will need to be transition from intravenous heparin to an oral anticoagulant. 3.  Acute renal failure -his creatinine peaked at 7.6.  It is slowly falling.  No evidence for emergent dialysis at this time.  Hopefully his kidney function will improve.  Sharrell Ku, MD

## 2020-09-04 NOTE — Progress Notes (Signed)
ANTICOAGULATION CONSULT NOTE  Pharmacy Consult for heparin Indication: atrial fibrillation  No Known Allergies  Patient Measurements: Height: 5\' 11"  (180.3 cm) Weight: 112.4 kg (247 lb 12.8 oz) IBW/kg (Calculated) : 75.3 Heparin Dosing Weight: 100kg  Vital Signs: Temp: 97.7 F (36.5 C) (01/09 1205) Temp Source: Oral (01/09 1205) BP: 153/82 (01/09 1205) Pulse Rate: 84 (01/09 0740)  Labs: Recent Labs    09/01/20 1538 09/02/20 0416 09/03/20 0419 09/03/20 2305 09/04/20 1446 09/04/20 1447  HGB 8.3* 8.2* 8.2*  --  7.1*  --   HCT 25.3* 24.7* 23.7*  --  19.6*  --   PLT 206 269 406*  --  510*  --   APTT  --   --   --  54*  --  77*  HEPARINUNFRC  --   --   --  0.37  --  0.30  CREATININE 7.17* 7.61* 7.46*  --   --   --   CKTOTAL  --  11/02/20* 8,688*  --   --   --     Estimated Creatinine Clearance: 12.9 mL/min (A) (by C-G formula based on SCr of 7.46 mg/dL (H)).   Medical History: Past Medical History:  Diagnosis Date  . AAA (abdominal aortic aneurysm) (HCC)   . History of open heart surgery   . Hypertension   . Seroma due to trauma [facility])     Assessment: 64yom with hx Afib on apixaban 5mg  bid pta admitted with bacteremia. Apixaban on hold for any possible needed procedures during acute illness and continuing on heparin drip. Last apixaban dose 1/5 AM. Will monitor using aPTT while apixaban may still be influencing heparin level. Hg 8.2 stable, plt high 406. No current active bleed issues reported.  Heparin rate was increased overnight for low level. However, labs have been unable to be drawn today despite multiple attempts, new line placed and lab able to be successfully drawn. Heparin level at goal (0.3) on 1500 units/hr. Aptt also at goal (77s). Hgb is down from 8.2 to 7.1 this afternoon. No bleeding issues noted, will follow closely.   Goal of Therapy:  Heparin level 0.3-0.7 units/ml aPTT 66-102 seconds Monitor platelets by anticoagulation protocol: Yes   Plan:   Continue heparin drip at 1500 units/hr for now Monitor daily CBC, s/sx bleeding  [facility] PharmD., BCPS Clinical Pharmacist 09/04/2020 3:16 PM

## 2020-09-04 NOTE — Progress Notes (Signed)
PROGRESS NOTE  Connor [name]  DOB: October 22, 1955  PCP: Patient, No Pcp Per VPX:[id number]  DOA: 08/30/2020  LOS: 5 days   Chief Complaint  Patient presents with  . Fever    Brief narrative: Connor [name] is a 65 y.o. male with PMH significant for HTN, CKD 3, A. 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  Patient presented to the ED on 1/4 with 1 week history of nausea, vomiting, chills followed by worsening left leg pain and swelling.  In the ED, he was noted to be in severe sepsis with WBC count elevated to 15, lactic acid 2, creatinine 4.2 with a baseline of 1.3, potassium low at 2.8.  CK level was elevated more than 28,000. Ultrasound rule out DVT.   CT scan of left leg showed soft tissue swelling but no muscle damage and no signs of abscess although limited due to lack of contrast and CT due to renal function.  MRI noted signs of myofascitis.   Patient was admitted to hospitalist service for cellulitis, rhabdomyolysis, AKI. Orthopedic and nephrology consultation were obtained. Blood cultures sent on admission grew Streptococcus group G, cellulitis likely being the source.    Over the course of hospitalization, sepsis, rhabdomyolysis improved.  His creatinine is still elevated but he is making adequate urine output.   Because of bacteremia in the setting of aortic graft, he needs TEE and hence transferred management to [facility] on 1/8.  Subjective: Patient was seen and examined this morning.   Lying on bed.  Not in distress.  Remains on heparin drip, admitted and drip.  Difficult stick.  Unable to get labs this morning.  Repeat blood culture sent yesterday.  Assessment/Plan: Severe sepsis - POA Left leg cellulitis/myofascitis Bacteremia with Streptococcus  group G -Gradually improving left leg cellulitis, lactic acid normalized, procalcitonin improving.  But WBC count tends to be rising up.  No fever last 24 hours.  No labs today.  Currently on IV Rocephin.   -Echocardiogram 1/6 did not comment on any evidence of vegetation.  Because of bacteremia in the setting of aortic graft, he needs a TEE.  Cardiology consulted.  -Repeat blood culture sent on 1/8. Recent Labs  Lab 08/30/20 1947 08/30/20 2215 08/31/20 0154 08/31/20 0414 09/01/20 0532 09/01/20 1026 09/01/20 1538 09/02/20 0416 09/03/20 0419  WBC 15.6*  --   --  18.3* 21.4*  --  22.2* 22.8* 25.8*  LATICACIDVEN 2.1* 2.4* 1.5 1.4  --  0.9  --   --   --   PROCALCITON  --   --   --   --   --  43.41  --  34.97 20.62   Acute kidney injury -Baseline creatinine less than 1.4.  Patient with elevated creatinine of 4.2, peaked at 7.6 on 1/7.  Felt to be due to acute tubular injury and decreased renal perfusion in the setting of rhabdomyolysis.  Labs pending today.  Patient is making adequate urine output but creatinine remains elevated. -Continue to monitor.  Nephrology following. Recent Labs    12/25/19 1233 02/04/20 1415 04/29/20 1619 05/20/20 1615 08/30/20 1947 08/31/20 0414 08/31/20 1912 09/01/20 0532 09/01/20 1538 09/02/20 0416 09/03/20 0419  BUN 13 16 15 11  55* 54*  --  78* 84* 91* 99*  CREATININE 1.03 1.27 1.25 1.38*  4.21* 4.56* 5.77* 6.51* 7.17* 7.61* 7.46*   Rhabdomyolysis  -Secondary to left leg myofascitis -CK level and transaminases level are trending down as below with IV fluid. -Continue LR at 125 mill per hour. Recent Labs  Lab 08/31/20 1418 09/01/20 0532 09/02/20 0416 09/03/20 0419  CKTOTAL 28,083* 24,138* 16,946* 8,688*   Recent Labs  Lab 08/30/20 1947 08/31/20 0414 09/01/20 0532 09/01/20 1538 09/02/20 0416  AST 279* 274* 220* 193* 173*  ALT 60* 58* 56* 54* 54*  ALKPHOS 69 59 75 77 84  BILITOT 3.0* 4.1* 3.2* 2.8* 2.3*  PROT 7.2 6.5 6.3* 6.1* 6.4*   ALBUMIN 2.8* 2.6* 2.5* 2.2* 2.3*   Essential hypertension Chronic diastolic dysfunction -Home meds include labetalol 200 mg twice daily, nifedipine 90 mg daily, Aldactone 25 mg daily, valsartan 80 mg daily -Meds initially held because of sepsis.  -Currently on labetalol 200 mg twice daily and nifedipine 90 mg daily.  Aldactone valsartan on hold. -Echo from 1/6 with EF 60 to 65% and grade 2 diastolic dysfunction  Persistent A. Fib -Patient has history of A. fib and DC cardioversion. -On admission, he was noted to be in A. fib and was started on amiodarone drip.  Prior to admission, patient was on Eliquis for anticoagulation.  Currently on heparin drip in case requires any surgical intervention. -Cardiology following.  History of type aortic arch dissection  -He is status post repair in 2016 and had dissection at anastomosis suture line seen December 2019.  Course was complicated by cardiogenic shock with EF of 15%.  Ultimately underwent total arch replacement in February 2020 while at [place].  -Because of bacteremia in the setting of aortic graft, he would need a TEE. -Continue aspirin and Eliquis. ?Not on a statin. -Obtain lipid panel No results found for: CHOL, TRIG, HDL, CHOLHDL, VLDL, LDLCALC, LDLDIRECT, LABVLDL  Transaminitis -Probably due to sepsis.  Improving. Recent Labs  Lab 08/30/20 1947 08/31/20 0414 09/01/20 0532 09/01/20 1538 09/02/20 0416  AST 279* 274* 220* 193* 173*  ALT 60* 58* 56* 54* 54*  ALKPHOS 69 59 75 77 84  BILITOT 3.0* 4.1* 3.2* 2.8* 2.3*  PROT 7.2 6.5 6.3* 6.1* 6.4*  ALBUMIN 2.8* 2.6* 2.5* 2.2* 2.3*   Hypokalemia -Potassium level running low.  Defer to nephrology for replacement because of coexisting AKI Recent Labs  Lab 08/31/20 0414 09/01/20 0532 09/01/20 1538 09/02/20 0416 09/03/20 0419  K 3.3* 3.9 3.3* 3.4* 3.4*  MG 2.0  --   --   --   --   PHOS 3.5  --   --   --   --    Hyperglycemia -No history of diabetes.  A1c 4.6 on  1/7. -Fingersticks consistently less than 150. Recent Labs  Lab 08/30/20 1940 09/03/20 0745 09/04/20 0741  GLUCAP 163* 100* 104*   Mobility: Encourage ambulation Code Status:   Code Status: Full Code  Nutritional status: Body mass index is 34.56 kg/m.     Diet Order            Diet heart healthy/carb modified Room service appropriate? Yes; Fluid consistency: Thin  Diet effective now                 DVT prophylaxis: SCDs Start: 08/30/20 2133   Antimicrobials:  IV Rocephin Fluid: LR at 125 mL/h Consultants: Nephrology, cardiology Family Communication:  None at bedside  Status is: Inpatient  Remains inpatient appropriate because: Ongoing work-up for sepsis, AKI Dispo: The patient is from: Home  Anticipated d/c is to: Home likely              Anticipated d/c date is: > 3 days              Patient currently is not medically stable to d/c.    Infusions:  . amiodarone 30 mg/hr (09/04/20 0600)  . cefTRIAXone (ROCEPHIN)  IV 200 mL/hr at 09/04/20 0600  . heparin 1,500 Units/hr (09/04/20 0600)  . lactated ringers 125 mL/hr at 09/04/20 0600    Scheduled Meds: . aspirin EC  81 mg Oral Daily  . Chlorhexidine Gluconate Cloth  6 each Topical Q0600  . feeding supplement  237 mL Oral BID BM  . labetalol  200 mg Oral BID  . mouth rinse  15 mL Mouth Rinse BID    Antimicrobials: Anti-infectives (From admission, onward)   Start     Dose/Rate Route Frequency Ordered Stop   09/02/20 2200  cefTRIAXone (ROCEPHIN) 2 g in sodium chloride 0.9 % 100 mL IVPB        2 g 200 mL/hr over 30 Minutes Intravenous Every 24 hours 09/02/20 1044     09/01/20 2200  vancomycin (VANCOREADY) IVPB 1500 mg/300 mL  Status:  Discontinued        1,500 mg 150 mL/hr over 120 Minutes Intravenous Every 48 hours 08/31/20 0350 08/31/20 1719   09/01/20 0130  ceFEPIme (MAXIPIME) 2 g in sodium chloride 0.9 % 100 mL IVPB  Status:  Discontinued        2 g 200 mL/hr over 30 Minutes Intravenous Every  24 hours 08/31/20 1812 09/02/20 1043   09/01/20 0100  metroNIDAZOLE (FLAGYL) IVPB 500 mg  Status:  Discontinued        500 mg 100 mL/hr over 60 Minutes Intravenous Every 8 hours 08/31/20 1751 08/31/20 1926   08/31/20 2200  ceFEPIme (MAXIPIME) 2 g in sodium chloride 0.9 % 100 mL IVPB  Status:  Discontinued        2 g 200 mL/hr over 30 Minutes Intravenous Every 24 hours 08/31/20 0350 08/31/20 1719   08/31/20 2030  vancomycin (VANCOREADY) IVPB 1500 mg/300 mL        1,500 mg 150 mL/hr over 120 Minutes Intravenous  Once 08/31/20 2016 08/31/20 2338   08/31/20 2015  clindamycin (CLEOCIN) IVPB 900 mg  Status:  Discontinued        900 mg 100 mL/hr over 30 Minutes Intravenous Every 8 hours 08/31/20 1926 09/01/20 1033   08/31/20 1812  vancomycin variable dose per unstable renal function (pharmacist dosing)  Status:  Discontinued         Does not apply See admin instructions 08/31/20 1812 09/01/20 1033   08/31/20 1730  cefTRIAXone (ROCEPHIN) 2 g in sodium chloride 0.9 % 100 mL IVPB  Status:  Discontinued        2 g 200 mL/hr over 30 Minutes Intravenous Every 24 hours 08/31/20 1720 08/31/20 1751   08/31/20 1015  metroNIDAZOLE (FLAGYL) IVPB 500 mg  Status:  Discontinued        500 mg 100 mL/hr over 60 Minutes Intravenous Every 8 hours 08/31/20 1012 08/31/20 1719   08/31/20 0045  ceFEPIme (MAXIPIME) 2 g in sodium chloride 0.9 % 100 mL IVPB        2 g 200 mL/hr over 30 Minutes Intravenous  Once 08/31/20 0030 08/31/20 0208   08/31/20 0000  vancomycin (VANCOREADY) IVPB 1500 mg/300 mL        1,500 mg 150 mL/hr over 120  Minutes Intravenous  Once 08/30/20 2351 08/31/20 0208   08/30/20 1900  cefTRIAXone (ROCEPHIN) 2 g in sodium chloride 0.9 % 100 mL IVPB  Status:  Discontinued        2 g 200 mL/hr over 30 Minutes Intravenous Every 24 hours 08/30/20 1855 08/31/20 0030      PRN meds: HYDROmorphone (DILAUDID) injection, ondansetron (ZOFRAN) IV, oxyCODONE   Objective: Vitals:   09/04/20 0400 09/04/20  0740  BP: (!) 143/68 (!) 174/77  Pulse: 86 84  Resp: (!) 21 20  Temp: 98.4 F (36.9 C) 98.4 F (36.9 C)  SpO2: 98% 98%    Intake/Output Summary (Last 24 hours) at 09/04/2020 0941 Last data filed at 09/04/2020 3382 Gross per 24 hour  Intake 3962.71 ml  Output 3655 ml  Net 307.71 ml   Filed Weights   09/01/20 0540 09/03/20 0403 09/03/20 1500  Weight: 114.8 kg 118.1 kg 112.4 kg   Weight change: -5.7 kg Body mass index is 34.56 kg/m.   Physical Exam: General exam: Pleasant middle-aged African-American male.  Not in physical distress Skin: No rashes, lesions or ulcers. HEENT: Atraumatic, normocephalic, no obvious bleeding Lungs: Clear to auscultation bilaterally CVS: Regular rate and rhythm, no murmur GI/Abd soft, nontender, nondistended, bowel sound present CNS: Alert, awake, knows he is in the hospital, confused and some details of the history.  Not restless or agitated Psychiatry: Mood appropriate Extremities: Left leg swelling gradually improving.  Data Review: I have personally reviewed the laboratory data and studies available.  Recent Labs  Lab 08/30/20 1947 08/31/20 0414 09/01/20 0532 09/01/20 1538 09/02/20 0416 09/03/20 0419  WBC 15.6* 18.3* 21.4* 22.2* 22.8* 25.8*  NEUTROABS 13.3*  --   --   --   --   --   HGB 10.5* 10.1* 9.1* 8.3* 8.2* 8.2*  HCT 31.7* 31.0* 29.2* 25.3* 24.7* 23.7*  MCV 91.1 92.5 98.6 91.7 88.5 86.2  PLT 136* 137* 187 206 269 406*   Recent Labs  Lab 08/31/20 0414 08/31/20 1912 09/01/20 0532 09/01/20 1538 09/02/20 0416 09/03/20 0419  NA 134*  --  135 137 138 138  K 3.3*  --  3.9 3.3* 3.4* 3.4*  CL 102  --  102 102 103 103  CO2 20*  --  16* 20* 18* 20*  GLUCOSE 143*  --  104* 101* 106* 107*  BUN 54*  --  78* 84* 91* 99*  CREATININE 4.56* 5.77* 6.51* 7.17* 7.61* 7.46*  CALCIUM 7.3*  --  7.0* 7.1* 7.0* 7.6*  MG 2.0  --   --   --   --   --   PHOS 3.5  --   --   --   --   --     F/u labs ordered  Signed, Lorin Glass, MD [facility] 09/04/2020

## 2020-09-04 NOTE — Progress Notes (Signed)
Pharmacy notified lab was unsuccessful in drawing ordered labs. PMD also notified patient would need a line placed for blood draws since he's on a heparin drip he will need frequent labs.  Jerimah Witucki, Kae Heller, RN

## 2020-09-04 NOTE — Progress Notes (Signed)
ANTICOAGULATION CONSULT NOTE  Pharmacy Consult for heparin Indication: atrial fibrillation  No Known Allergies  Patient Measurements: Height: 5\' 11"  (180.3 cm) Weight: 112.4 kg (247 lb 12.8 oz) IBW/kg (Calculated) : 75.3 Heparin Dosing Weight: 100kg  Vital Signs: Temp: 97.7 F (36.5 C) (01/09 1205) Temp Source: Oral (01/09 1205) BP: 153/82 (01/09 1205) Pulse Rate: 84 (01/09 0740)  Labs: Recent Labs    09/01/20 1538 09/02/20 0416 09/03/20 0419 09/03/20 2305  HGB 8.3* 8.2* 8.2*  --   HCT 25.3* 24.7* 23.7*  --   PLT 206 269 406*  --   APTT  --   --   --  54*  HEPARINUNFRC  --   --   --  0.37  CREATININE 7.17* 7.61* 7.46*  --   CKTOTAL  --  11/01/20* 8,688*  --     Estimated Creatinine Clearance: 12.9 mL/min (A) (by C-G formula based on SCr of 7.46 mg/dL (H)).   Medical History: Past Medical History:  Diagnosis Date  . AAA (abdominal aortic aneurysm) (HCC)   . History of open heart surgery   . Hypertension   . Seroma due to trauma [facility])     Assessment: 64yom with hx Afib on apixaban 5mg  bid pta admitted with bacteremia. Apixaban on hold for any possible needed procedures during acute illness and continuing on heparin drip. Last apixaban dose 1/5 AM. Will monitor using aPTT while apixaban may still be influencing heparin level. Hg 8.2 stable, plt high 406. No current active bleed issues reported.  Heparin rate was increased overnight for low level. However, labs have been unable to be drawn today despite multiple attempts. Patient just had midline placed this afternoon for further attempts to draw labs. Per discussion with Dr. [facility], if unable to draw labs from midline, will have to hold heparin until able to do so. RN aware.  Goal of Therapy:  Heparin level 0.3-0.7 units/ml aPTT 66-102 seconds Monitor platelets by anticoagulation protocol: Yes   Plan:  Continue heparin drip at 1500 units/hr for now F/u stat aPTT/heparin level as able - will have to hold heparin  if unable to draw per MD Monitor daily CBC, s/sx bleeding   , PharmD, BCPS Please check AMION for all [facility] contact numbers Clinical Pharmacist 09/04/2020 2:41 PM

## 2020-09-04 NOTE — Progress Notes (Signed)
ANTICOAGULATION CONSULT NOTE - Follow Up Consult  Pharmacy Consult for heparin Indication: atrial fibrillation  Labs: Recent Labs    09/01/20 0532 09/01/20 1538 09/02/20 0416 09/03/20 0419 09/03/20 2305  HGB 9.1* 8.3* 8.2* 8.2*  --   HCT 29.2* 25.3* 24.7* 23.7*  --   PLT 187 206 269 406*  --   APTT  --   --   --   --  54*  HEPARINUNFRC  --   --   --   --  0.37  CREATININE 6.51* 7.17* 7.61* 7.46*  --   CKTOTAL 24,138*  --  17,616* 8,688*  --     Assessment: 65yo male subtherapeutic on heparin with initial dosing while Eliquis on hold; no signs of bleeding per RN but she does note that heparin was paused x74min just prior to lab draw when pt lost IV site.  Goal of Therapy:  Heparin level 0.3-0.7 units/ml aPTT 66-102 seconds   Plan:  Will increase heparin gtt by ~1 unit/kg/hr to 1500 units/hr and check labs in 8 hours.    Vernard Gambles, PharmD, BCPS  09/04/2020,12:24 AM

## 2020-09-04 NOTE — Progress Notes (Signed)
18g x 8cm Midline placed in the dominant arm. RUA/Brachial vein.

## 2020-09-04 NOTE — Progress Notes (Signed)
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: AKI likely secondary to acute tubular injury decreased renal perfusion from relative hypotension, rhabdomyolysis in the setting of NSAID, spironolactone, and valsartan use - no new labs 2/2 access (I think PICC is unwise, high ESRD potential in future) but UOP way up and encouraging of GFR recovery; no RRT indications -abdominal ultrasound without obstruction  Severe sepsis with bacteremia and UTI -positive bcx for gpc x 2 (streptococcus) -positive ucx for e faecalis -likely related to LLE cellulitis and UTI, abx per primary service -TEE planned  Rhabdomyolysis -continue with fluids as above, trend CPK (improving slowly); reduce LR to 50/h today  Hypotension, resolved -likely related to sepsis -home anti-hypertensives held -keep MAP>65  Metabolic acidosis, anion gap. Stable Stable HCO3  Chronic hypokalemia. 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 Was supposed to see endocrinology for this. Would be cautious with repletion if needed.  Normocytic Anemia -Transfuse for Hgb<7 g/dL. Avoid iron in the setting of bacteremia/acute infection   Subjective:     Now at [facility]  >3L UOP; Trouble obtaining AM Labs  Cardioology planning TEE to eval for IE; streptococcal bacteremia    Objective:   BP (!) 153/82 (BP Location: Right Arm)   Pulse 84   Temp 97.7 F (36.5 C) (Oral)   Resp 20   Ht 5\' 11"  (1.803 m)   Wt 112.4 kg   SpO2 98%   BMI 34.56 kg/m   Intake/Output Summary (Last 24 hours) at 09/04/2020 1318 Last data filed at 09/04/2020 11/02/2020 Gross per 24 hour  Intake 3962.71 ml  Output 3030 ml  Net 932.71 ml   Weight change: -5.7 kg  Physical Exam: Gen:NAD, pleasant, conversant CVS:reg rate Resp:cta bl, no w/r/r/c heard 9476, nt Ext:LLE edema (painful to touch), trace RLE edema Neuro: no asterixis, speech clear and coherent, moves all ext  spontaneously  Imaging: No results found.  Labs: BMET Recent Labs  Lab 08/30/20 1947 08/31/20 0414 08/31/20 1912 09/01/20 0532 09/01/20 1538 09/02/20 0416 09/03/20 0419  NA 137 134*  --  135 137 138 138  K 2.8* 3.3*  --  3.9 3.3* 3.4* 3.4*  CL 103 102  --  102 102 103 103  CO2 22 20*  --  16* 20* 18* 20*  GLUCOSE 162* 143*  --  104* 101* 106* 107*  BUN 55* 54*  --  78* 84* 91* 99*  CREATININE 4.21* 4.56* 5.77* 6.51* 7.17* 7.61* 7.46*  CALCIUM 7.8* 7.3*  --  7.0* 7.1* 7.0* 7.6*  PHOS  --  3.5  --   --   --   --   --    CBC Recent Labs  Lab 08/30/20 1947 08/31/20 0414 09/01/20 0532 09/01/20 1538 09/02/20 0416 09/03/20 0419  WBC 15.6*   < > 21.4* 22.2* 22.8* 25.8*  NEUTROABS 13.3*  --   --   --   --   --   HGB 10.5*   < > 9.1* 8.3* 8.2* 8.2*  HCT 31.7*   < > 29.2* 25.3* 24.7* 23.7*  MCV 91.1   < > 98.6 91.7 88.5 86.2  PLT 136*   < > 187 206 269 406*   < > = values in this interval not displayed.    Medications:    . aspirin EC  81 mg Oral Daily  . Chlorhexidine Gluconate Cloth  6 each Topical Q0600  . feeding supplement  237 mL Oral BID  BM  . labetalol  200 mg Oral BID  . mouth rinse  15 mL Mouth Rinse BID      Arita Miss, MD  [facility] 09/04/2020, 1:18 PM

## 2020-09-05 ENCOUNTER — Inpatient Hospital Stay (HOSPITAL_COMMUNITY): Payer: Medicaid Other

## 2020-09-05 ENCOUNTER — Inpatient Hospital Stay (HOSPITAL_COMMUNITY): Payer: Medicaid Other | Admitting: Certified Registered"

## 2020-09-05 ENCOUNTER — Encounter (HOSPITAL_COMMUNITY)
Admission: EM | Disposition: A | Discharge status: Rehabilitation Facility | Payer: Self-pay | Source: Home / Self Care | Attending: Internal Medicine

## 2020-09-05 ENCOUNTER — Encounter (HOSPITAL_COMMUNITY): Payer: Self-pay | Admitting: Internal Medicine

## 2020-09-05 DIAGNOSIS — R7881 Bacteremia: Secondary | ICD-10-CM

## 2020-09-05 DIAGNOSIS — R6520 Severe sepsis without septic shock: Secondary | ICD-10-CM | POA: Diagnosis not present

## 2020-09-05 DIAGNOSIS — I4819 Other persistent atrial fibrillation: Secondary | ICD-10-CM | POA: Diagnosis not present

## 2020-09-05 DIAGNOSIS — A409 Streptococcal sepsis, unspecified: Secondary | ICD-10-CM | POA: Diagnosis not present

## 2020-09-05 HISTORY — PX: BUBBLE STUDY: SHX6837

## 2020-09-05 HISTORY — PX: TEE WITHOUT CARDIOVERSION: SHX5443

## 2020-09-05 LAB — ABO/RH: ABO/RH(D): O POS

## 2020-09-05 LAB — GLUCOSE, CAPILLARY: Glucose-Capillary: 116 mg/dL — ABNORMAL HIGH (ref 70–99)

## 2020-09-05 LAB — BASIC METABOLIC PANEL
Anion gap: 11 (ref 5–15)
BUN: 79 mg/dL — ABNORMAL HIGH (ref 8–23)
CO2: 23 mmol/L (ref 22–32)
Calcium: 7.7 mg/dL — ABNORMAL LOW (ref 8.9–10.3)
Chloride: 110 mmol/L (ref 98–111)
Creatinine, Ser: 4.96 mg/dL — ABNORMAL HIGH (ref 0.61–1.24)
GFR, Estimated: 12 mL/min — ABNORMAL LOW (ref >60)
Glucose, Bld: 114 mg/dL — ABNORMAL HIGH (ref 70–99)
Potassium: 3.1 mmol/L — ABNORMAL LOW (ref 3.5–5.1)
Sodium: 144 mmol/L (ref 135–145)

## 2020-09-05 LAB — CBC
HCT: 19 % — ABNORMAL LOW (ref 39.0–52.0)
Hemoglobin: 6.8 g/dL — CL (ref 13.0–17.0)
MCH: 29.7 pg (ref 26.0–34.0)
MCHC: 35.8 g/dL (ref 30.0–36.0)
MCV: 83 fL (ref 80.0–100.0)
Platelets: 530 10*3/uL — ABNORMAL HIGH (ref 150–400)
RBC: 2.29 MIL/uL — ABNORMAL LOW (ref 4.22–5.81)
RDW: 15.2 % (ref 11.5–15.5)
WBC: 18.9 10*3/uL — ABNORMAL HIGH (ref 4.0–10.5)
nRBC: 0.1 % (ref 0.0–0.2)

## 2020-09-05 LAB — APTT: aPTT: 154 seconds — ABNORMAL HIGH (ref 24–36)

## 2020-09-05 LAB — CK: Total CK: 2084 U/L — ABNORMAL HIGH (ref 49–397)

## 2020-09-05 LAB — HEPARIN LEVEL (UNFRACTIONATED): Heparin Unfractionated: 0.69 IU/mL (ref 0.30–0.70)

## 2020-09-05 LAB — PROCALCITONIN: Procalcitonin: 5.24 ng/mL

## 2020-09-05 LAB — PREPARE RBC (CROSSMATCH)

## 2020-09-05 SURGERY — ECHOCARDIOGRAM, TRANSESOPHAGEAL
Anesthesia: Monitor Anesthesia Care

## 2020-09-05 MED ORDER — DEXMEDETOMIDINE (PRECEDEX) IN NS 20 MCG/5ML (4 MCG/ML) IV SYRINGE
PREFILLED_SYRINGE | INTRAVENOUS | Status: DC | PRN
  Administered 2020-09-05 (×5): 4 ug via INTRAVENOUS

## 2020-09-05 MED ORDER — PROPOFOL 500 MG/50ML IV EMUL
INTRAVENOUS | Status: DC | PRN
  Administered 2020-09-05: 125 ug/kg/min via INTRAVENOUS

## 2020-09-05 MED ORDER — LIDOCAINE HCL (CARDIAC) PF 100 MG/5ML IV SOSY
PREFILLED_SYRINGE | INTRAVENOUS | Status: DC | PRN
  Administered 2020-09-05: 50 mg via INTRAVENOUS

## 2020-09-05 MED ORDER — FENTANYL CITRATE (PF) 100 MCG/2ML IJ SOLN
INTRAMUSCULAR | Status: DC | PRN
  Administered 2020-09-05: 50 ug via INTRAVENOUS

## 2020-09-05 MED ORDER — PROPOFOL 10 MG/ML IV BOLUS
INTRAVENOUS | Status: DC | PRN
  Administered 2020-09-05: 30 mg via INTRAVENOUS
  Administered 2020-09-05 (×3): 10 mg via INTRAVENOUS
  Administered 2020-09-05: 20 mg via INTRAVENOUS

## 2020-09-05 MED ORDER — LIDOCAINE HCL URETHRAL/MUCOSAL 2 % EX GEL
CUTANEOUS | Status: DC | PRN
  Administered 2020-09-05: 1 via TOPICAL

## 2020-09-05 MED ORDER — LIDOCAINE VISCOUS HCL 2 % MT SOLN
OROMUCOSAL | Status: AC
  Filled 2020-09-05: qty 15

## 2020-09-05 MED ORDER — GLYCOPYRROLATE 0.2 MG/ML IJ SOLN
INTRAMUSCULAR | Status: DC | PRN
  Administered 2020-09-05 (×2): .1 mg via INTRAVENOUS

## 2020-09-05 MED ORDER — SODIUM CHLORIDE 0.9% IV SOLUTION: Freq: Once | INTRAVENOUS | Status: AC

## 2020-09-05 NOTE — Progress Notes (Signed)
CRITICAL VALUE ALERT  Critical Value:  HGB 6.8  Date & Time Notied:  09/05/2020, 0600  Provider Notified: [place]  Orders Received/Actions taken: Orders given for 1 unit of blood

## 2020-09-05 NOTE — Progress Notes (Signed)
PROGRESS NOTE  Connor [name]  DOB: September 26, 1955  PCP: Patient, No Pcp Per [facility]:[id number]  DOA: 08/30/2020  LOS: 6 days   Chief Complaint  Patient presents with  . Fever    Brief narrative: Connor [name] is a 65 y.o. male with PMH significant for HTN, CKD 3, A. 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  Patient presented to the ED on 1/4 with 1 week history of nausea, vomiting, chills followed by worsening left leg pain and swelling.  In the ED, he was noted to be in severe sepsis with WBC count elevated to 15, lactic acid 2, creatinine 4.2 with a baseline of 1.3, potassium low at 2.8.  CK level was elevated more than 28,000. Ultrasound rule out DVT.   CT scan of left leg showed soft tissue swelling but no muscle damage and no signs of abscess although limited due to lack of contrast and CT due to renal function.  MRI noted signs of myofascitis.   Patient was admitted to hospitalist service for cellulitis, rhabdomyolysis, AKI. Orthopedic and nephrology consultation were obtained. Blood cultures sent on admission grew Streptococcus group G, cellulitis likely being the source.    Over the course of hospitalization, sepsis, rhabdomyolysis improved.  His creatinine is still elevated but he is making adequate urine output.   Because of bacteremia in the setting of aortic graft, he needs TEE and hence transferred management to [facility] on 1/8.  Subjective: Patient was seen and examined this morning. Not in distress. No new symptoms. He was waiting for TEE. TEE was done later in the day.  Assessment/Plan: Severe sepsis - POA Left leg cellulitis/myofascitis Bacteremia with Streptococcus group G -Gradually improving left leg cellulitis, lactic acid normalized,  procalcitonin improving. White count trending down finally. No fever. Currently on IV Rocephin. -Echocardiogram 1/6 did not comment on any evidence of vegetation.  Because of bacteremia in the setting of aortic graft, TEE was obtained. No evidence of vegetation. -Repeat blood culture sent on 1/8. No growth so far. Recent Labs  Lab 08/30/20 1947 08/30/20 2215 08/31/20 0154 08/31/20 0414 09/01/20 0532 09/01/20 1026 09/01/20 1538 09/02/20 0416 09/03/20 0419 09/04/20 1446 09/05/20 0420  WBC 15.6*  --   --  18.3*   < >  --  22.2* 22.8* 25.8* 20.4* 18.9*  LATICACIDVEN 2.1* 2.4* 1.5 1.4  --  0.9  --   --   --   --   --   PROCALCITON  --   --   --   --   --  43.41  --  34.97 20.62 7.47 5.24   < > = values in this interval not displayed.   Acute kidney injury -Baseline creatinine less than 1.4.  Patient with elevated creatinine of 4.2, peaked at 7.6 on 1/7.  Felt to be due to acute tubular injury and decreased renal perfusion in the setting of rhabdomyolysis. Creatinine improving gradually. Patient is making adequate urine output. -Continue to monitor.  Nephrology following. Recent Labs    04/29/20 1619 05/20/20 1615 08/30/20 1947 08/31/20 0414 08/31/20 1912 09/01/20 0532 09/01/20 1538 09/02/20 0416 09/03/20 0419 09/04/20 1446 09/05/20 0420  BUN 15 11 55* 54*  --  78* 84* 91* 99* 87* 79*  CREATININE 1.25 1.38* 4.21* 4.56* 5.77* 6.51* 7.17* 7.61* 7.46*  5.79* 4.96*   Rhabdomyolysis  -Secondary to left leg myofascitis -CK level and transaminases level are trending down as below with IV fluid. -Continue LR at a low rate of 50 mill per hour. Recent Labs  Lab 08/31/20 1418 09/01/20 0532 09/02/20 0416 09/03/20 0419 09/04/20 1446 09/05/20 0420  CKTOTAL 28,083* 24,138* 40,981* 8,688* 3,244* 2,084*   Recent Labs  Lab 08/31/20 0414 09/01/20 0532 09/01/20 1538 09/02/20 0416 09/04/20 1446  AST 274* 220* 193* 173* 75*  ALT 58* 56* 54* 54* 45*  ALKPHOS 59 75 77 84 80  BILITOT  4.1* 3.2* 2.8* 2.3* 1.3*  PROT 6.5 6.3* 6.1* 6.4* 5.7*  ALBUMIN 2.6* 2.5* 2.2* 2.3* 1.9*   Essential hypertension Chronic diastolic dysfunction -Home meds include labetalol 200 mg twice daily, nifedipine 90 mg daily, Aldactone 25 mg daily, valsartan 80 mg daily -Meds initially held because of sepsis.  -Currently on labetalol 200 mg twice daily and nifedipine 90 mg daily.  Aldactone and valsartan are on hold. -Echo from 1/6 with EF 60 to 65% and grade 2 diastolic dysfunction.  Persistent A. Fib -Patient has history of A. fib and DC cardioversion. -On admission, he was noted to be in A. fib and was started on amiodarone drip. Currently off amiodarone drip. On labetalol. Prior to admission, patient was on Eliquis for anticoagulation.  Currently on heparin drip in case requires any surgical intervention. -Cardiology following.  History of type aortic arch dissection  -He is status post repair in 2016 and had dissection at anastomosis suture line seen December 2019.  Course was complicated by cardiogenic shock with EF of 15%.  Ultimately underwent total arch replacement in February 2020 while at [place].  -Continue aspirin and Eliquis. ?Not on a statin. -Lipid panel with HDL less than 10. Will discuss with cardiology about any specific statin/fibrate he will benefit from.  Transaminitis -Probably due to sepsis.  Improving. Recent Labs  Lab 08/31/20 0414 09/01/20 0532 09/01/20 1538 09/02/20 0416 09/04/20 1446  AST 274* 220* 193* 173* 75*  ALT 58* 56* 54* 54* 45*  ALKPHOS 59 75 77 84 80  BILITOT 4.1* 3.2* 2.8* 2.3* 1.3*  PROT 6.5 6.3* 6.1* 6.4* 5.7*  ALBUMIN 2.6* 2.5* 2.2* 2.3* 1.9*   Chronic hypokalemia -Potassium level running low mostly between 3-3.5. Recent Labs  Lab 08/31/20 0414 09/01/20 0532 09/01/20 1538 09/02/20 0416 09/03/20 0419 09/04/20 1446 09/05/20 0420  K 3.3*   < > 3.3* 3.4* 3.4* 3.0* 3.1*  MG 2.0  --   --   --   --   --   --   PHOS 3.5  --   --   --   --    --   --    < > = values in this interval not displayed.   Hyperglycemia -No history of diabetes.  A1c 4.6 on 1/7. -Fingersticks consistently less than 150. Recent Labs  Lab 08/30/20 1940 09/03/20 0745 09/04/20 0741 09/05/20 1509  GLUCAP 163* 100* 104* 116*   Mobility: Encourage ambulation Code Status:   Code Status: Full Code  Nutritional status: Body mass index is 34.56 kg/m.     Diet Order            Diet heart healthy/carb modified Room service appropriate? Yes; Fluid consistency: Thin  Diet effective now                 DVT prophylaxis: SCDs Start: 08/30/20 2133   Antimicrobials:  IV Rocephin Fluid: LR at 50 mill per  hour. Consultants: Nephrology, cardiology Family Communication:  None at bedside  Status is: Inpatient  Remains inpatient appropriate because: Ongoing work-up for sepsis, AKI Dispo: The patient is from: Home              Anticipated d/c is to: Home likely              Anticipated d/c date is: > 3 days              Patient currently is not medically stable to d/c.    Infusions:  . cefTRIAXone (ROCEPHIN)  IV 50 mL/hr at 09/05/20 0600  . lactated ringers 50 mL/hr at 09/05/20 1324    Scheduled Meds: . aspirin EC  81 mg Oral Daily  . Chlorhexidine Gluconate Cloth  6 each Topical Q0600  . feeding supplement  237 mL Oral BID BM  . labetalol  200 mg Oral BID  . mouth rinse  15 mL Mouth Rinse BID  . sodium chloride flush  10-40 mL Intracatheter Q12H    Antimicrobials: Anti-infectives (From admission, onward)   Start     Dose/Rate Route Frequency Ordered Stop   09/02/20 2200  cefTRIAXone (ROCEPHIN) 2 g in sodium chloride 0.9 % 100 mL IVPB        2 g 200 mL/hr over 30 Minutes Intravenous Every 24 hours 09/02/20 1044     09/01/20 2200  vancomycin (VANCOREADY) IVPB 1500 mg/300 mL  Status:  Discontinued        1,500 mg 150 mL/hr over 120 Minutes Intravenous Every 48 hours 08/31/20 0350 08/31/20 1719   09/01/20 0130  ceFEPIme (MAXIPIME) 2 g in  sodium chloride 0.9 % 100 mL IVPB  Status:  Discontinued        2 g 200 mL/hr over 30 Minutes Intravenous Every 24 hours 08/31/20 1812 09/02/20 1043   09/01/20 0100  metroNIDAZOLE (FLAGYL) IVPB 500 mg  Status:  Discontinued        500 mg 100 mL/hr over 60 Minutes Intravenous Every 8 hours 08/31/20 1751 08/31/20 1926   08/31/20 2200  ceFEPIme (MAXIPIME) 2 g in sodium chloride 0.9 % 100 mL IVPB  Status:  Discontinued        2 g 200 mL/hr over 30 Minutes Intravenous Every 24 hours 08/31/20 0350 08/31/20 1719   08/31/20 2030  vancomycin (VANCOREADY) IVPB 1500 mg/300 mL        1,500 mg 150 mL/hr over 120 Minutes Intravenous  Once 08/31/20 2016 08/31/20 2338   08/31/20 2015  clindamycin (CLEOCIN) IVPB 900 mg  Status:  Discontinued        900 mg 100 mL/hr over 30 Minutes Intravenous Every 8 hours 08/31/20 1926 09/01/20 1033   08/31/20 1812  vancomycin variable dose per unstable renal function (pharmacist dosing)  Status:  Discontinued         Does not apply See admin instructions 08/31/20 1812 09/01/20 1033   08/31/20 1730  cefTRIAXone (ROCEPHIN) 2 g in sodium chloride 0.9 % 100 mL IVPB  Status:  Discontinued        2 g 200 mL/hr over 30 Minutes Intravenous Every 24 hours 08/31/20 1720 08/31/20 1751   08/31/20 1015  metroNIDAZOLE (FLAGYL) IVPB 500 mg  Status:  Discontinued        500 mg 100 mL/hr over 60 Minutes Intravenous Every 8 hours 08/31/20 1012 08/31/20 1719   08/31/20 0045  ceFEPIme (MAXIPIME) 2 g in sodium chloride 0.9 % 100 mL IVPB  2 g 200 mL/hr over 30 Minutes Intravenous  Once 08/31/20 0030 08/31/20 0208   08/31/20 0000  vancomycin (VANCOREADY) IVPB 1500 mg/300 mL        1,500 mg 150 mL/hr over 120 Minutes Intravenous  Once 08/30/20 2351 08/31/20 0208   08/30/20 1900  cefTRIAXone (ROCEPHIN) 2 g in sodium chloride 0.9 % 100 mL IVPB  Status:  Discontinued        2 g 200 mL/hr over 30 Minutes Intravenous Every 24 hours 08/30/20 1855 08/31/20 0030      PRN  meds: HYDROmorphone (DILAUDID) injection, ondansetron (ZOFRAN) IV, oxyCODONE, sodium chloride flush   Objective: Vitals:   09/05/20 1420 09/05/20 1437  BP: (!) 155/60 (!) 161/85  Pulse: 78 80  Resp: 17 17  Temp:  97.8 F (36.6 C)  SpO2: 100% 98%    Intake/Output Summary (Last 24 hours) at 09/05/2020 1703 Last data filed at 09/05/2020 1400 Gross per 24 hour  Intake 2356.89 ml  Output 2000 ml  Net 356.89 ml   Filed Weights   09/01/20 0540 09/03/20 0403 09/03/20 1500  Weight: 114.8 kg 118.1 kg 112.4 kg   Weight change:  Body mass index is 34.56 kg/m.   Physical Exam: General exam: Pleasant middle-aged African-American male.  Not in physical distress Skin: No rashes, lesions or ulcers. HEENT: Atraumatic, normocephalic, no obvious bleeding Lungs: Clear to auscultation bilaterally CVS: Regular rate and rhythm, no murmur GI/Abd soft, nontender, nondistended, bowel sound present CNS: Alert, awake, oriented x3. Psychiatry: Mood appropriate Extremities: Left leg swelling gradually improving.  Data Review: I have personally reviewed the laboratory data and studies available.  Recent Labs  Lab 08/30/20 1947 08/31/20 0414 09/01/20 1538 09/02/20 0416 09/03/20 0419 09/04/20 1446 09/05/20 0420  WBC 15.6*   < > 22.2* 22.8* 25.8* 20.4* 18.9*  NEUTROABS 13.3*  --   --   --   --   --   --   HGB 10.5*   < > 8.3* 8.2* 8.2* 7.1* 6.8*  HCT 31.7*   < > 25.3* 24.7* 23.7* 19.6* 19.0*  MCV 91.1   < > 91.7 88.5 86.2 83.1 83.0  PLT 136*   < > 206 269 406* 510* 530*   < > = values in this interval not displayed.   Recent Labs  Lab 08/31/20 0414 08/31/20 1912 09/01/20 1538 09/02/20 0416 09/03/20 0419 09/04/20 1446 09/05/20 0420  NA 134*   < > 137 138 138 142 144  K 3.3*   < > 3.3* 3.4* 3.4* 3.0* 3.1*  CL 102   < > 102 103 103 107 110  CO2 20*   < > 20* 18* 20* 21* 23  GLUCOSE 143*   < > 101* 106* 107* 124* 114*  BUN 54*   < > 84* 91* 99* 87* 79*  CREATININE 4.56*   < > 7.17*  7.61* 7.46* 5.79* 4.96*  CALCIUM 7.3*   < > 7.1* 7.0* 7.6* 7.6* 7.7*  MG 2.0  --   --   --   --   --   --   PHOS 3.5  --   --   --   --   --   --    < > = values in this interval not displayed.    F/u labs ordered  Signed, Lorin GlassBinaya Mayleigh Tetrault, MD [facility] 09/05/2020

## 2020-09-05 NOTE — Progress Notes (Signed)
PT Cancellation Note  Patient Details Name: Connor [name] MRN: [id number] DOB: 1956-01-22   Cancelled Treatment:    Reason Eval/Treat Not Completed: Medical issues which prohibited therapy - pt getting blood for low hgb, plan for procedure at 12:30. PT to check back later per RN request.   Marye Round, PT Acute Rehabilitation Services Pager [phone]  Office [phone]    Markasia Carrol E Christain [place] 09/05/2020, 11:12 AM

## 2020-09-05 NOTE — Progress Notes (Signed)
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 CPK improving. Hopeful he will continue to have renal recovery -no indications for renal replacement therapy as of right now. Monitor for auto-diuresis -abdominal ultrasound without obstruction -Avoid nephrotoxic medications including NSAIDs and iodinated intravenous contrast exposure unless the latter is absolutely indicated.  Preferred narcotic agents for pain control are hydromorphone, fentanyl, and methadone. Morphine should not be used. Avoid Baclofen and avoid oral sodium phosphate and magnesium citrate based laxatives / bowel preps. Continue strict Input and Output monitoring. Will monitor the patient closely with you and intervene or adjust therapy as indicated by changes in clinical status/labs   Severe sepsis with bacteremia and UTI -positive bcx for gpc x 2 (streptococcus) -positive ucx for e faecalis -likely related to LLE cellulitis and UTI, abx per primary service -TEE planned  Rhabdomyolysis -continue with fluids as above, trend CPK (improving slowly)  Hypotension, resolved -likely related to sepsis -home anti-hypertensives held -keep MAP>65  Metabolic acidosis, anion gap. Stable Stable HCO3  Chronic hypokalemia. 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 Was supposed to see endocrinology for this. Would be cautious with repletion if needed.  Normocytic Anemia -Transfuse for Hgb<7 g/dL. Avoid iron in the setting of bacteremia/acute infection   Subjective:     No complaints other than pain in LLE.    Objective:   BP (!) 173/66   Pulse 72   Temp 97.7 F (36.5 C) (Oral)   Resp 20   Ht 5\' 11"  (1.803 m)   Wt 112.4 kg   SpO2 93%    BMI 34.56 kg/m   Intake/Output Summary (Last 24 hours) at 09/05/2020 1342 Last data filed at 09/05/2020 1228 Gross per 24 hour  Intake 3139.73 ml  Output 1725 ml  Net 1414.73 ml   Weight change:   Physical Exam: Gen:NAD, pleasant, conversant CVS:reg rate Resp:cta bl, no w/r/r/c heard 1229, nt Ext:LLE edema (painful to touch), trace RLE edema Neuro: no asterixis, speech clear and coherent, moves all ext spontaneously  Imaging: No results found.  Labs: BMET Recent Labs  Lab 08/31/20 0414 08/31/20 1912 09/01/20 0532 09/01/20 1538 09/02/20 0416 09/03/20 0419 09/04/20 1446 09/05/20 0420  NA 134*  --  135 137 138 138 142 144  K 3.3*  --  3.9 3.3* 3.4* 3.4* 3.0* 3.1*  CL 102  --  102 102 103 103 107 110  CO2 20*  --  16* 20* 18* 20* 21* 23  GLUCOSE 143*  --  104* 101* 106* 107* 124* 114*  BUN 54*  --  78* 84* 91* 99* 87* 79*  CREATININE 4.56* 5.77* 6.51* 7.17* 7.61* 7.46* 5.79* 4.96*  CALCIUM 7.3*  --  7.0* 7.1* 7.0* 7.6* 7.6* 7.7*  PHOS 3.5  --   --   --   --   --   --   --    CBC Recent Labs  Lab 08/30/20 1947 08/31/20 0414 09/02/20 0416 09/03/20 0419 09/04/20 1446 09/05/20 0420  WBC 15.6*   < > 22.8* 25.8* 20.4* 18.9*  NEUTROABS 13.3*  --   --   --   --   --   HGB 10.5*   < > 8.2* 8.2* 7.1* 6.8*  HCT 31.7*   < > 24.7* 23.7*  19.6* 19.0*  MCV 91.1   < > 88.5 86.2 83.1 83.0  PLT 136*   < > 269 406* 510* 530*   < > = values in this interval not displayed.    Medications:    . [MAR Hold] aspirin EC  81 mg Oral Daily  . [MAR Hold] Chlorhexidine Gluconate Cloth  6 each Topical Q0600  . [MAR Hold] feeding supplement  237 mL Oral BID BM  . [MAR Hold] labetalol  200 mg Oral BID  . [MAR Hold] mouth rinse  15 mL Mouth Rinse BID  . [MAR Hold] sodium chloride flush  10-40 mL Intracatheter Q12H      [name], MD  [facility] 09/05/2020, 1:42 PM

## 2020-09-05 NOTE — Progress Notes (Signed)
 Progress Note  Patient Name: Krist Meritt Date of Encounter: 09/05/2020  [facility] Cardiologist: Christopher L Schumann, MD   Subjective   No CP or Dyspnea  Inpatient Medications    Scheduled Meds: . sodium chloride   Intravenous Once  . aspirin EC  81 mg Oral Daily  . Chlorhexidine Gluconate Cloth  6 each Topical Q0600  . feeding supplement  237 mL Oral BID BM  . labetalol  200 mg Oral BID  . mouth rinse  15 mL Mouth Rinse BID  . sodium chloride flush  10-40 mL Intracatheter Q12H   Continuous Infusions: . amiodarone 30 mg/hr (09/05/20 0600)  . cefTRIAXone (ROCEPHIN)  IV 50 mL/hr at 09/05/20 0600  . lactated ringers 50 mL/hr at 09/04/20 2000   PRN Meds: HYDROmorphone (DILAUDID) injection, ondansetron (ZOFRAN) IV, oxyCODONE, sodium chloride flush   Vital Signs    Vitals:   09/05/20 0400 09/05/20 0800 09/05/20 0901 09/05/20 0924  BP: (!) 163/56 (!) 148/78 134/76 139/87  Pulse: 70 77    Resp:  18 18 18  Temp: 98.6 F (37 C) 97.7 F (36.5 C) (!) 97.5 F (36.4 C) 97.7 F (36.5 C)  TempSrc: Oral Oral Oral Oral  SpO2: 96% 96%    Weight:      Height:        Intake/Output Summary (Last 24 hours) at 09/05/2020 0954 Last data filed at 09/05/2020 0901 Gross per 24 hour  Intake 3205.73 ml  Output 1875 ml  Net 1330.73 ml   Last 3 Weights 09/03/2020 09/03/2020 09/01/2020  Weight (lbs) 247 lb 12.8 oz 260 lb 5.8 oz 253 lb 1.4 oz  Weight (kg) 112.4 kg 118.1 kg 114.8 kg      Telemetry    Atrial fibrillation rate controlled - Personally Reviewed  Physical Exam   GEN: No acute distress.   Neck: No JVD Cardiac: irregular Respiratory: Clear to auscultation bilaterally. GI: Soft, nontender, non-distended  MS: LLE with 2+ edema Neuro:  Nonfocal  Psych: Normal affect   Labs    High Sensitivity Troponin:   Recent Labs  Lab 08/30/20 1926 08/30/20 2215  TROPONINIHS 425* 482*      Chemistry Recent Labs  Lab 09/01/20 1538 09/02/20 0416 09/03/20 0419  09/04/20 1446 09/05/20 0420  NA 137 138 138 142 144  K 3.3* 3.4* 3.4* 3.0* 3.1*  CL 102 103 103 107 110  CO2 20* 18* 20* 21* 23  GLUCOSE 101* 106* 107* 124* 114*  BUN 84* 91* 99* 87* 79*  CREATININE 7.17* 7.61* 7.46* 5.79* 4.96*  CALCIUM 7.1* 7.0* 7.6* 7.6* 7.7*  PROT 6.1* 6.4*  --  5.7*  --   ALBUMIN 2.2* 2.3*  --  1.9*  --   AST 193* 173*  --  75*  --   ALT 54* 54*  --  45*  --   ALKPHOS 77 84  --  80  --   BILITOT 2.8* 2.3*  --  1.3*  --   GFRNONAA 8* 7* 8* 10* 12*  ANIONGAP 15 17* 15 14 11     Hematology Recent Labs  Lab 09/03/20 0419 09/04/20 1446 09/05/20 0420  WBC 25.8* 20.4* 18.9*  RBC 2.75* 2.36* 2.29*  HGB 8.2* 7.1* 6.8*  HCT 23.7* 19.6* 19.0*  MCV 86.2 83.1 83.0  MCH 29.8 30.1 29.7  MCHC 34.6 36.2* 35.8  RDW 15.0 15.2 15.2  PLT 406* 510* 530*    Patient Profile     64 y.o. male with past medical   history aortic dissection repair, previous cardiomyopathy improved, persistent atrial fibrillation, hypertension admitted with diarrhea, leg pain with fasciitis, fever.  Found to be bacteremic and also in atrial fibrillation with elevated rate.  Cardiology asked to evaluate.  Echocardiogram shows ejection fraction 60 to 65%, severe left ventricular hypertrophy, grade 2 diastolic dysfunction, biatrial enlargement.  Assessment & Plan    1 bacteremia-patient is scheduled for transesophageal echocardiogram later today to exclude vegetation.  2 persistent atrial fibrillation-heart rate is controlled.  Best option at this point would likely be rate control and anticoagulation.  Discontinue IV amiodarone and continue labetalol.  Can increase AV nodal blocking agents as needed based on follow-up telemetry.  Continue IV heparin.  Transition back to apixaban once all procedures complete.  3 acute kidney injury-felt secondary to rhabdomyolysis.  Nephrology following.  Follow renal function closely.  4 left leg cellulitis/fasciitis-continue antibiotics.  5 history of aortic  arch dissection repair-needs good blood pressure control.  For questions or updates, please contact [facility] Please consult [email or web address] for contact info under        Signed, Olga Millers, MD  09/05/2020, 9:54 AM

## 2020-09-05 NOTE — Progress Notes (Signed)
  Echocardiogram Echocardiogram Transesophageal has been performed.  Connor [name] 09/05/2020, 2:20 PM

## 2020-09-05 NOTE — H&P (View-Only) (Signed)
Progress Note  Patient Name: Connor [name] Date of Encounter: 09/05/2020  [facility] Cardiologist: Little Ishikawa, MD   Subjective   No CP or Dyspnea  Inpatient Medications    Scheduled Meds: . sodium chloride   Intravenous Once  . aspirin EC  81 mg Oral Daily  . Chlorhexidine Gluconate Cloth  6 each Topical Q0600  . feeding supplement  237 mL Oral BID BM  . labetalol  200 mg Oral BID  . mouth rinse  15 mL Mouth Rinse BID  . sodium chloride flush  10-40 mL Intracatheter Q12H   Continuous Infusions: . amiodarone 30 mg/hr (09/05/20 0600)  . cefTRIAXone (ROCEPHIN)  IV 50 mL/hr at 09/05/20 0600  . lactated ringers 50 mL/hr at 09/04/20 2000   PRN Meds: HYDROmorphone (DILAUDID) injection, ondansetron (ZOFRAN) IV, oxyCODONE, sodium chloride flush   Vital Signs    Vitals:   09/05/20 0400 09/05/20 0800 09/05/20 0901 09/05/20 0924  BP: (!) 163/56 (!) 148/78 134/76 139/87  Pulse: 70 77    Resp:  18 18 18   Temp: 98.6 F (37 C) 97.7 F (36.5 C) (!) 97.5 F (36.4 C) 97.7 F (36.5 C)  TempSrc: Oral Oral Oral Oral  SpO2: 96% 96%    Weight:      Height:        Intake/Output Summary (Last 24 hours) at 09/05/2020 0954 Last data filed at 09/05/2020 0901 Gross per 24 hour  Intake 3205.73 ml  Output 1875 ml  Net 1330.73 ml   Last 3 Weights 09/03/2020 09/03/2020 09/01/2020  Weight (lbs) 247 lb 12.8 oz 260 lb 5.8 oz 253 lb 1.4 oz  Weight (kg) 112.4 kg 118.1 kg 114.8 kg      Telemetry    Atrial fibrillation rate controlled - Personally Reviewed  Physical Exam   GEN: No acute distress.   Neck: No JVD Cardiac: irregular Respiratory: Clear to auscultation bilaterally. GI: Soft, nontender, non-distended  MS: LLE with 2+ edema Neuro:  Nonfocal  Psych: Normal affect   Labs    High Sensitivity Troponin:   Recent Labs  Lab 08/30/20 1926 08/30/20 2215  TROPONINIHS 425* 482*      Chemistry Recent Labs  Lab 09/01/20 1538 09/02/20 0416 09/03/20 0419  09/04/20 1446 09/05/20 0420  NA 137 138 138 142 144  K 3.3* 3.4* 3.4* 3.0* 3.1*  CL 102 103 103 107 110  CO2 20* 18* 20* 21* 23  GLUCOSE 101* 106* 107* 124* 114*  BUN 84* 91* 99* 87* 79*  CREATININE 7.17* 7.61* 7.46* 5.79* 4.96*  CALCIUM 7.1* 7.0* 7.6* 7.6* 7.7*  PROT 6.1* 6.4*  --  5.7*  --   ALBUMIN 2.2* 2.3*  --  1.9*  --   AST 193* 173*  --  75*  --   ALT 54* 54*  --  45*  --   ALKPHOS 77 84  --  80  --   BILITOT 2.8* 2.3*  --  1.3*  --   GFRNONAA 8* 7* 8* 10* 12*  ANIONGAP 15 17* 15 14 11      Hematology Recent Labs  Lab 09/03/20 0419 09/04/20 1446 09/05/20 0420  WBC 25.8* 20.4* 18.9*  RBC 2.75* 2.36* 2.29*  HGB 8.2* 7.1* 6.8*  HCT 23.7* 19.6* 19.0*  MCV 86.2 83.1 83.0  MCH 29.8 30.1 29.7  MCHC 34.6 36.2* 35.8  RDW 15.0 15.2 15.2  PLT 406* 510* 530*    Patient Profile     65 y.o. male with past medical  history aortic dissection repair, previous cardiomyopathy improved, persistent atrial fibrillation, hypertension admitted with diarrhea, leg pain with fasciitis, fever.  Found to be bacteremic and also in atrial fibrillation with elevated rate.  Cardiology asked to evaluate.  Echocardiogram shows ejection fraction 60 to 65%, severe left ventricular hypertrophy, grade 2 diastolic dysfunction, biatrial enlargement.  Assessment & Plan    1 bacteremia-patient is scheduled for transesophageal echocardiogram later today to exclude vegetation.  2 persistent atrial fibrillation-heart rate is controlled.  Best option at this point would likely be rate control and anticoagulation.  Discontinue IV amiodarone and continue labetalol.  Can increase AV nodal blocking agents as needed based on follow-up telemetry.  Continue IV heparin.  Transition back to apixaban once all procedures complete.  3 acute kidney injury-felt secondary to rhabdomyolysis.  Nephrology following.  Follow renal function closely.  4 left leg cellulitis/fasciitis-continue antibiotics.  5 history of aortic  arch dissection repair-needs good blood pressure control.  For questions or updates, please contact [facility] Please consult [email or web address] for contact info under        Signed, Olga Millers, MD  09/05/2020, 9:54 AM

## 2020-09-05 NOTE — Anesthesia Preprocedure Evaluation (Signed)
Anesthesia Evaluation  Patient identified by MRN, date of birth, ID band Patient awake    Reviewed: Allergy & Precautions, H&P , NPO status , Patient's Chart, lab work & pertinent test results  Airway Mallampati: II  TM Distance: >3 FB Neck ROM: Full    Dental no notable dental hx. (+) Dental Advisory Given   Pulmonary Current Smoker and Patient abstained from smoking.,    Pulmonary exam normal breath sounds clear to auscultation       Cardiovascular hypertension, Normal cardiovascular exam Rhythm:Regular Rate:Normal     Neuro/Psych negative neurological ROS     GI/Hepatic negative GI ROS, Neg liver ROS,   Endo/Other  negative endocrine ROS  Renal/GU Renal InsufficiencyRenal disease     Musculoskeletal negative musculoskeletal ROS (+)   Abdominal   Peds  Hematology  (+) anemia ,   Anesthesia Other Findings   Reproductive/Obstetrics                             Anesthesia Physical Anesthesia Plan  ASA: III  Anesthesia Plan: MAC   Post-op Pain Management:    Induction: Intravenous  PONV Risk Score and Plan: 0 and Propofol infusion and Treatment may vary due to age or medical condition  Airway Management Planned: Nasal Cannula  Additional Equipment:   Intra-op Plan:   Post-operative Plan:   Informed Consent: I have reviewed the patients History and Physical, chart, labs and discussed the procedure including the risks, benefits and alternatives for the proposed anesthesia with the patient or authorized representative who has indicated his/her understanding and acceptance.     Dental advisory given  Plan Discussed with: CRNA, Anesthesiologist and Surgeon  Anesthesia Plan Comments:         Anesthesia Quick Evaluation

## 2020-09-05 NOTE — Interval H&P Note (Signed)
History and Physical Interval Note:  09/05/2020 12:58 PM  Connor [name]  has presented today for surgery, with the diagnosis of bacteremia.  The various methods of treatment have been discussed with the patient and family. After consideration of risks, benefits and other options for treatment, the patient has consented to  Procedure(s): TRANSESOPHAGEAL ECHOCARDIOGRAM (TEE) (N/A) as a surgical intervention.  The patient's history has been reviewed, patient examined, no change in status, stable for surgery.  I have reviewed the patient's chart and labs.  Questions were answered to the patient's satisfaction.     Meriam Sprague

## 2020-09-05 NOTE — Transfer of Care (Signed)
Immediate Anesthesia Transfer of Care Note  Patient: Connor [name]  Procedure(s) Performed: TRANSESOPHAGEAL ECHOCARDIOGRAM (TEE) (N/A ) BUBBLE STUDY  Patient Location: PACU  Anesthesia Type:MAC  Level of Consciousness: awake, alert  and oriented  Airway & Oxygen Therapy: Patient Spontanous Breathing  Post-op Assessment: Report given to RN and Post -op Vital signs reviewed and stable  Post vital signs: Reviewed and stable  Last Vitals:  Vitals Value Taken Time  BP 146/101 09/05/20 1358  Temp    Pulse 82 09/05/20 1400  Resp    SpO2 99 % 09/05/20 1400  Vitals shown include unvalidated device data.  Last Pain:  Vitals:   09/05/20 1242  TempSrc:   PainSc: 8       Patients Stated Pain Goal: 0 (33/38/32 9191)  Complications: No complications documented.

## 2020-09-05 NOTE — Procedures (Signed)
     Transesophageal Echocardiogram Note  Quindarrius Joplin [id number] 13-May-1956  Procedure: Transesophageal Echocardiogram Indications: Strep bacteremia  Procedure Details Consent: Obtained Time Out: Verified patient identification, verified procedure, site/side was marked, verified correct patient position, special equipment/implants available, Radiology Safety Procedures followed,  medications/allergies/relevent history reviewed, required imaging and test results available.  Performed  Medications: Propofol: 350mg  Lidocaine: 50mg  Precedex:20mg  Fentanyl  Administered by anesthesia  Left Ventrical:  Normal LVEF  Mitral Valve: Mildly thickened. No vegetations visualized  Aortic Valve: Mildly thickened. Trace to mild AI. No vegetations visualized  Tricuspid Valve: Mild TR. No vegetations visualized  Pulmonic Valve: Normal structure. No vegetations visualized  Left Atrium/ Left atrial appendage: No evidence of LAA thrombus  Atrial septum: Agitated saline study notable for intrapulmonary shunting. No PFO seen.  Aorta: Patient is s/p aortic arch repair from prior aortic dissection. 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   Complications: No apparent complications Patient did tolerate procedure well.  , MD 09/05/2020, 1:49 PM

## 2020-09-05 NOTE — Progress Notes (Signed)
ANTICOAGULATION CONSULT NOTE  Pharmacy Consult for heparin Indication: atrial fibrillation  No Known Allergies  Patient Measurements: Height: 5\' 11"  (180.3 cm) Weight: 112.4 kg (247 lb 12.8 oz) IBW/kg (Calculated) : 75.3 Heparin Dosing Weight: 100kg  Vital Signs: Temp: 97.7 F (36.5 C) (01/10 1228) Temp Source: Oral (01/10 1228) BP: 173/66 (01/10 1242) Pulse Rate: 72 (01/10 1228)  Labs: Recent Labs    09/03/20 0419 09/03/20 2305 09/04/20 1446 09/04/20 1447 09/05/20 0420  HGB 8.2*  --  7.1*  --  6.8*  HCT 23.7*  --  19.6*  --  19.0*  PLT 406*  --  510*  --  530*  APTT  --  54*  --  77* 154*  HEPARINUNFRC  --  0.37  --  0.30 0.69  CREATININE 7.46*  --  5.79*  --  4.96*  CKTOTAL 8,688*  --  3,244*  --  2,084*    Estimated Creatinine Clearance: 19.4 mL/min (A) (by C-G formula based on SCr of 4.96 mg/dL (H)).   Medical History: Past Medical History:  Diagnosis Date  . AAA (abdominal aortic aneurysm) (HCC)   . History of open heart surgery   . Hypertension   . Seroma due to trauma [facility])     Assessment: 64yom with hx Afib on apixaban 5mg  bid pta admitted with bacteremia. Apixaban on hold for any possible needed procedures during acute illness and continuing on heparin drip. Last apixaban dose 1/5 AM.   Heparin level this AM is high therapeutic at 0.69 with aPTT showing above normal range. Heparin level is likely more reliable at this point and will stop monitoring aPTT. Hgb dropped to 6.8 this AM but no overt s/s of bleeding noted per RN and patient is being transfused 1 unit of PRBC today. Plt remains high.   Goal of Therapy:  Heparin level 0.3-0.7 units/ml Monitor platelets by anticoagulation protocol: Yes   Plan:  Decrease heparin drip slightly to 1450 units/hr Monitor daily HL, CBC, s/sx bleeding  [facility], PharmD., BCPS, BCCCP Clinical Pharmacist Please refer to [facility] for unit-specific pharmacist

## 2020-09-05 NOTE — Consult Note (Signed)
WOC Nurse Consult Note: Patient receiving care in [facility] 4N12. Reason for Consult: LLE blisters Wound type: Infectious, related to Streptococcus group G sepsis Pressure Injury POA: Yes/No/NA Measurement: na Wound bed: pink Drainage (amount, consistency, odor) heavy serous Periwound: peeling Dressing procedure/placement/frequency: Place as many Xeroform gauzes [place] [phone]) as needed over intact and ruptured blisters on LLE. Top with ABD pads, secure with kerlex. When the dressings are changed, moisten with saline, if needed, to remove without ripping skin off. Monitor the wound area(s) for worsening of condition such as: Signs/symptoms of infection,  Increase in size,  Development of or worsening of odor, Development of pain, or increased pain at the affected locations.  Notify the medical team if any of these develop.  Thank you for the consult.  Discussed plan of care with the patient and bedside nurse.  WOC nurse will not follow at this time.  Please re-consult the WOC team if needed.  Helmut Muster, RN, MSN, CWOCN, CNS-BC, pager [phone]

## 2020-09-06 ENCOUNTER — Other Ambulatory Visit: Payer: Self-pay | Admitting: Physician Assistant

## 2020-09-06 DIAGNOSIS — N179 Acute kidney failure, unspecified: Secondary | ICD-10-CM

## 2020-09-06 DIAGNOSIS — A409 Streptococcal sepsis, unspecified: Secondary | ICD-10-CM | POA: Diagnosis not present

## 2020-09-06 DIAGNOSIS — A419 Sepsis, unspecified organism: Secondary | ICD-10-CM

## 2020-09-06 DIAGNOSIS — A46 Erysipelas: Secondary | ICD-10-CM

## 2020-09-06 DIAGNOSIS — I4819 Other persistent atrial fibrillation: Secondary | ICD-10-CM | POA: Diagnosis not present

## 2020-09-06 DIAGNOSIS — R6520 Severe sepsis without septic shock: Secondary | ICD-10-CM | POA: Diagnosis not present

## 2020-09-06 DIAGNOSIS — L03116 Cellulitis of left lower limb: Secondary | ICD-10-CM | POA: Diagnosis not present

## 2020-09-06 LAB — RETICULOCYTES
Immature Retic Fract: 28 % — ABNORMAL HIGH (ref 2.3–15.9)
RBC.: 2.56 MIL/uL — ABNORMAL LOW (ref 4.22–5.81)
Retic Count, Absolute: 80.9 10*3/uL (ref 19.0–186.0)
Retic Ct Pct: 3.2 % — ABNORMAL HIGH (ref 0.4–3.1)

## 2020-09-06 LAB — BASIC METABOLIC PANEL
Anion gap: 12 (ref 5–15)
BUN: 67 mg/dL — ABNORMAL HIGH (ref 8–23)
CO2: 23 mmol/L (ref 22–32)
Calcium: 7.8 mg/dL — ABNORMAL LOW (ref 8.9–10.3)
Chloride: 109 mmol/L (ref 98–111)
Creatinine, Ser: 3.73 mg/dL — ABNORMAL HIGH (ref 0.61–1.24)
GFR, Estimated: 17 mL/min — ABNORMAL LOW (ref >60)
Glucose, Bld: 99 mg/dL (ref 70–99)
Potassium: 3.3 mmol/L — ABNORMAL LOW (ref 3.5–5.1)
Sodium: 144 mmol/L (ref 135–145)

## 2020-09-06 LAB — CK: Total CK: 1066 U/L — ABNORMAL HIGH (ref 49–397)

## 2020-09-06 LAB — CBC
HCT: 22.1 % — ABNORMAL LOW (ref 39.0–52.0)
Hemoglobin: 7.5 g/dL — ABNORMAL LOW (ref 13.0–17.0)
MCH: 29.4 pg (ref 26.0–34.0)
MCHC: 33.9 g/dL (ref 30.0–36.0)
MCV: 86.7 fL (ref 80.0–100.0)
Platelets: 538 10*3/uL — ABNORMAL HIGH (ref 150–400)
RBC: 2.55 MIL/uL — ABNORMAL LOW (ref 4.22–5.81)
RDW: 16 % — ABNORMAL HIGH (ref 11.5–15.5)
WBC: 14.9 10*3/uL — ABNORMAL HIGH (ref 4.0–10.5)
nRBC: 0.3 % — ABNORMAL HIGH (ref 0.0–0.2)

## 2020-09-06 LAB — HEMOGLOBIN AND HEMATOCRIT, BLOOD
HCT: 24 % — ABNORMAL LOW (ref 39.0–52.0)
Hemoglobin: 8.3 g/dL — ABNORMAL LOW (ref 13.0–17.0)

## 2020-09-06 LAB — PREPARE RBC (CROSSMATCH)

## 2020-09-06 LAB — PROCALCITONIN: Procalcitonin: 2.99 ng/mL

## 2020-09-06 LAB — VITAMIN B12: Vitamin B-12: 837 pg/mL (ref 180–914)

## 2020-09-06 LAB — TROPONIN I (HIGH SENSITIVITY): Troponin I (High Sensitivity): 32 ng/L — ABNORMAL HIGH (ref <18)

## 2020-09-06 LAB — FOLATE: Folate: 8.6 ng/mL (ref >5.9)

## 2020-09-06 LAB — IRON AND TIBC
Iron: 79 ug/dL (ref 45–182)
Saturation Ratios: 39 % (ref 17.9–39.5)
TIBC: 202 ug/dL — ABNORMAL LOW (ref 250–450)
UIBC: 123 ug/dL

## 2020-09-06 LAB — HEPARIN LEVEL (UNFRACTIONATED)
Heparin Unfractionated: 0.11 IU/mL — ABNORMAL LOW (ref 0.30–0.70)
Heparin Unfractionated: 0.12 IU/mL — ABNORMAL LOW (ref 0.30–0.70)

## 2020-09-06 LAB — FERRITIN: Ferritin: 1090 ng/mL — ABNORMAL HIGH (ref 24–336)

## 2020-09-06 MED ORDER — SODIUM CHLORIDE 0.9% IV SOLUTION: Freq: Once | INTRAVENOUS | Status: AC

## 2020-09-06 MED ORDER — APIXABAN 5 MG PO TABS: 5.0000 mg | ORAL_TABLET | Freq: Two times a day (BID) | ORAL | Status: DC

## 2020-09-06 MED ORDER — HEPARIN (PORCINE) 25000 UT/250ML-% IV SOLN
2350.0000 [IU]/h | INTRAVENOUS | Status: DC
  Administered 2020-09-06 (×2): 1450 [IU]/h via INTRAVENOUS
  Administered 2020-09-07: 1550 [IU]/h via INTRAVENOUS
  Administered 2020-09-07: 1750 [IU]/h via INTRAVENOUS
  Administered 2020-09-08: 1900 [IU]/h via INTRAVENOUS
  Administered 2020-09-08: 2100 [IU]/h via INTRAVENOUS
  Filled 2020-09-06 (×6): qty 250

## 2020-09-06 MED ORDER — CEFAZOLIN SODIUM-DEXTROSE 2-4 GM/100ML-% IV SOLN
2.0000 g | INTRAVENOUS | Status: AC
  Administered 2020-09-07: 2 g via INTRAVENOUS

## 2020-09-06 NOTE — Progress Notes (Signed)
Progress Note  Patient Name: Connor [name] Date of Encounter: 09/06/2020  [facility] Cardiologist: Little Ishikawa, MD   Subjective   Pt denies CP or Dyspnea  Inpatient Medications    Scheduled Meds: . aspirin EC  81 mg Oral Daily  . Chlorhexidine Gluconate Cloth  6 each Topical Q0600  . feeding supplement  237 mL Oral BID BM  . labetalol  200 mg Oral BID  . mouth rinse  15 mL Mouth Rinse BID  . sodium chloride flush  10-40 mL Intracatheter Q12H   Continuous Infusions: . cefTRIAXone (ROCEPHIN)  IV 2 g (09/05/20 2137)   PRN Meds: HYDROmorphone (DILAUDID) injection, ondansetron (ZOFRAN) IV, oxyCODONE, sodium chloride flush   Vital Signs    Vitals:   09/05/20 2123 09/05/20 2326 09/06/20 0400 09/06/20 0743  BP:  (!) 154/90 (!) 142/80 (!) 147/81  Pulse: (!) 101 83 83 92  Resp:  20 19 20   Temp:  97.6 F (36.4 C) 98.3 F (36.8 C) 97.9 F (36.6 C)  TempSrc:  Oral Oral Oral  SpO2:  97% 96% 95%  Weight:      Height:        Intake/Output Summary (Last 24 hours) at 09/06/2020 0934 Last data filed at 09/06/2020 0743 Gross per 24 hour  Intake 2094 ml  Output 2425 ml  Net -331 ml   Last 3 Weights 09/03/2020 09/03/2020 09/01/2020  Weight (lbs) 247 lb 12.8 oz 260 lb 5.8 oz 253 lb 1.4 oz  Weight (kg) 112.4 kg 118.1 kg 114.8 kg      Telemetry    Atrial fibrillation rate controlled - Personally Reviewed  Physical Exam   GEN: NAD Neck: No JVD, supple Cardiac: irregular, no murmur Respiratory: CTA GI: Soft, NT/ND MS: LLE with 2+ edema Neuro:  Grossly  intact Psych: Normal affect   Labs    High Sensitivity Troponin:   Recent Labs  Lab 08/30/20 1926 08/30/20 2215  TROPONINIHS 425* 482*      Chemistry Recent Labs  Lab 09/01/20 1538 09/02/20 0416 09/03/20 0419 09/04/20 1446 09/05/20 0420 09/06/20 0441  NA 137 138   < > 142 144 144  K 3.3* 3.4*   < > 3.0* 3.1* 3.3*  CL 102 103   < > 107 110 109  CO2 20* 18*   < > 21* 23 23  GLUCOSE 101* 106*    < > 124* 114* 99  BUN 84* 91*   < > 87* 79* 67*  CREATININE 7.17* 7.61*   < > 5.79* 4.96* 3.73*  CALCIUM 7.1* 7.0*   < > 7.6* 7.7* 7.8*  PROT 6.1* 6.4*  --  5.7*  --   --   ALBUMIN 2.2* 2.3*  --  1.9*  --   --   AST 193* 173*  --  75*  --   --   ALT 54* 54*  --  45*  --   --   ALKPHOS 77 84  --  80  --   --   BILITOT 2.8* 2.3*  --  1.3*  --   --   GFRNONAA 8* 7*   < > 10* 12* 17*  ANIONGAP 15 17*   < > 14 11 12    < > = values in this interval not displayed.     Hematology Recent Labs  Lab 09/04/20 1446 09/05/20 0420 09/06/20 0441  WBC 20.4* 18.9* 14.9*  RBC 2.36* 2.29* 2.55*  2.56*  HGB 7.1* 6.8* 7.5*  HCT 19.6*  19.0* 22.1*  MCV 83.1 83.0 86.7  MCH 30.1 29.7 29.4  MCHC 36.2* 35.8 33.9  RDW 15.2 15.2 16.0*  PLT 510* 530* 538*    Patient Profile     65 y.o. male with past medical history aortic dissection repair, previous cardiomyopathy improved, persistent atrial fibrillation, hypertension admitted with diarrhea, leg pain with fasciitis, fever.  Found to be bacteremic and also in atrial fibrillation with elevated rate.  Cardiology asked to evaluate.  Echocardiogram shows ejection fraction 60 to 65%, severe left ventricular hypertrophy, grade 2 diastolic dysfunction, biatrial enlargement.  Assessment & Plan    1 bacteremia-TEE showed no vegetation.  2 persistent atrial fibrillation-patient's heart rate remains controlled.  Continue labetalol at present dose.  He is essentially asymptomatic and rate control and anticoagulation will likely be best option long-term.  Continue heparin.  Can transition back to apixaban once all procedures complete.    3 acute kidney injury-felt secondary to rhabdomyolysis.  Nephrology following.  Creatinine improving this morning.  4 left leg cellulitis/fasciitis-continue antibiotics.  5 history of aortic arch dissection repair-needs good blood pressure control.  6 hypertension-blood pressure has been borderline.  Continue labetalol.  Can  resume Procardia as needed.  Would avoid ARB given renal insufficiency.  For questions or updates, please contact [facility] Please consult [email or web address] for contact info under        Signed, Olga Millers, MD  09/06/2020, 9:34 AM

## 2020-09-06 NOTE — Progress Notes (Signed)
ANTICOAGULATION CONSULT NOTE  Pharmacy Consult for heparin Indication: atrial fibrillation  No Known Allergies  Patient Measurements: Height: 5\' 11"  (180.3 cm) Weight: 112.4 kg (247 lb 12.8 oz) IBW/kg (Calculated) : 75.3 Heparin Dosing Weight: 100kg  Vital Signs: Temp: 98.2 F (36.8 C) (01/11 2155) Temp Source: Oral (01/11 2155) BP: 146/85 (01/11 2155) Pulse Rate: 98 (01/11 2155)  Labs: Recent Labs    09/03/20 2305 09/03/20 2305 09/04/20 1446 09/04/20 1447 09/05/20 0420 09/06/20 0441 09/06/20 2107  HGB  --    < > 7.1*  --  6.8* 7.5* 8.3*  HCT  --    < > 19.6*  --  19.0* 22.1* 24.0*  PLT  --   --  510*  --  530* 538*  --   APTT 54*  --   --  77* 154*  --   --   HEPARINUNFRC 0.37  --   --  0.30 0.69 0.11* 0.12*  CREATININE  --   --  5.79*  --  4.96* 3.73*  --   CKTOTAL  --   --  2108*  --  2,084* 1,066*  --    < > = values in this interval not displayed.    Estimated Creatinine Clearance: 25.8 mL/min (A) (by C-G formula based on SCr of 3.73 mg/dL (H)).   Medical History: Past Medical History:  Diagnosis Date  . AAA (abdominal aortic aneurysm) (HCC)   . History of open heart surgery   . Hypertension   . Seroma due to trauma [facility])     Assessment: 64yom with hx Afib on apixaban 5mg  bid pta admitted with bacteremia. Apixaban was on hold for any possible needed procedures during acute illness and started on IV heparin. IV heparin was briefly stopped on 1/10 with plans to transition back to apixaban today but now resuming IV heparin as patient will need orthopedic intervention. Plan for OR tomorrow  Hgb is 7.5 this AM after a PRBC transfusion yesterday. Plt wnl. SCr up but trending down Heparin drip 1450 uts/hr HL 0.12 < goal   Goal of Therapy:  Heparin level 0.3-0.7 units/ml Monitor platelets by anticoagulation protocol: Yes   Plan:  Increase heparin drip at 1550 units/hr Follow up plans for OR  Monitor daily HL, CBC, s/sx bleeding     Pharm.D. CPP, BCPS Clinical Pharmacist [phone] 09/06/2020 10:13 PM    Please refer to [facility] for unit-specific pharmacist

## 2020-09-06 NOTE — Progress Notes (Signed)
ANTICOAGULATION CONSULT NOTE  Pharmacy Consult for heparin Indication: atrial fibrillation  No Known Allergies  Patient Measurements: Height: 5\' 11"  (180.3 cm) Weight: 112.4 kg (247 lb 12.8 oz) IBW/kg (Calculated) : 75.3 Heparin Dosing Weight: 100kg  Vital Signs: Temp: 98.1 F (36.7 C) (01/11 1143) Temp Source: Oral (01/11 1143) BP: 140/87 (01/11 1143) Pulse Rate: 73 (01/11 1143)  Labs: Recent Labs    09/03/20 2305 09/03/20 2305 09/04/20 1446 09/04/20 1447 09/05/20 0420 09/06/20 0441  HGB  --    < > 7.1*  --  6.8* 7.5*  HCT  --   --  19.6*  --  19.0* 22.1*  PLT  --   --  510*  --  530* 538*  APTT 54*  --   --  77* 154*  --   HEPARINUNFRC 0.37  --   --  0.30 0.69 0.11*  CREATININE  --   --  5.79*  --  4.96* 3.73*  CKTOTAL  --   --  3,244*  --  2,084* 1,066*   < > = values in this interval not displayed.    Estimated Creatinine Clearance: 25.8 mL/min (A) (by C-G formula based on SCr of 3.73 mg/dL (H)).   Medical History: Past Medical History:  Diagnosis Date  . AAA (abdominal aortic aneurysm) (HCC)   . History of open heart surgery   . Hypertension   . Seroma due to trauma [facility])     Assessment: 64yom with hx Afib on apixaban 5mg  bid pta admitted with bacteremia. Apixaban was on hold for any possible needed procedures during acute illness and started on IV heparin. IV heparin was briefly stopped on 1/10 with plans to transition back to apixaban today but now resuming IV heparin as patient may need orthopedic intervention.   Hgb is 7.5 this AM after a PRBC transfusion yesterday. Plt wnl. SCr up but trending down  Goal of Therapy:  Heparin level 0.3-0.7 units/ml Monitor platelets by anticoagulation protocol: Yes   Plan:  Restart heparin drip at 1450 units/hr F/u 8 hr HL  Monitor daily HL, CBC, s/sx bleeding  , PharmD., BCPS, BCCCP Clinical Pharmacist Please refer to [facility] for unit-specific pharmacist

## 2020-09-06 NOTE — Progress Notes (Signed)
Pt new orders to transfuse 2 units PRBCs for surgery scheduled tomorrow AM. Pt type and screen done, O pos. Blood bank called to notify RN that only 1 unit would be available to prepare. They stated they are not preparing multiple of O pos at this time. RN called to notify PA whole placed order for transfusion and left message. Tr 6E RN aware.

## 2020-09-06 NOTE — Evaluation (Addendum)
Physical Therapy Re-Evaluation Patient Details Name: Connor [name] MRN: [id number] DOB: Jul 25, 1956 Today's Date: 09/06/2020   History of Present Illness  The pt is a 65 yo male presenting originally for nausea/vomiting, fever, and LLE pain and swelling. Pt found to have sepsis and rhabdomyolysis stemming from LLE cellulitis/myofascitis. PMH includes: AAA, CABG, and HTN.    Clinical Impression  The pt was in bed, agreeable to PT session with focus on progression of OOB tolerance and mobility. The pt was able to demo good initial bed mobility with minG and use of bed rail, but needs minA and assist with LLE to complete movements and manage LLE due to significant pain. The pt was then unable to tolerate sitting EOB >5 min (x2 attempts for 3-4 min each time), and was unable to attempt standing EOB due to LLE pain. Due to current limitations in strength, stability, power, ROM in LLE, and activity tolerance, the pt will require continued therapy acutely as well as continued 24/7 supervision/assist, and rehab prior to return home due to lack of family assist.     Follow Up Recommendations SNF;Supervision for mobility/OOB;Supervision - Intermittent    Equipment Recommendations  Rolling walker with 5" wheels    Recommendations for Other Services OT consult     Precautions / Restrictions Precautions Precautions: Fall Precaution Comments: LLE wound Restrictions Weight Bearing Restrictions: No      Mobility  Bed Mobility Overal bed mobility: Needs Assistance Bed Mobility: Rolling;Sidelying to Sit;Sit to Sidelying Rolling: Min guard Sidelying to sit: Min assist     Sit to sidelying: Min assist General bed mobility comments: minA to complete movement, arrange patient and reposition. he is able to complete movements with use of bed rails when given verbal cues    Transfers                 General transfer comment: pt declined OOB mobility at this time due to pain in LLE, unable to  tolerate LLE on ground, must have it supported at this time.          Pertinent Vitals/Pain Pain Assessment: Faces Faces Pain Scale: Hurts whole lot Pain Location: LLE with movement Pain Descriptors / Indicators: Grimacing;Guarding;Sore Pain Intervention(s): Limited activity within patient's tolerance;Monitored during session;Repositioned    Home Living Family/patient expects to be discharged to:: Private residence Living Arrangements: Other relatives (cousin (also disabled)) Available Help at Discharge: Family;Available PRN/intermittently Type of Home: Mobile home Home Access: Ramped entrance     Home Layout: One level Home Equipment: Cane - single point Additional Comments: pt states his cousin is disabled and could not provide any physical assist    Prior Function Level of Independence: Independent with assistive device(s)         Comments: 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[employer], drives, 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     Hand Dominance        Extremity/Trunk Assessment   Upper Extremity Assessment Upper Extremity Assessment: Overall WFL for tasks assessed    Lower Extremity Assessment Lower Extremity Assessment: Generalized weakness;LLE deficits/detail LLE Deficits / Details: grossly 3+/5, significant swelling throughout, weeping, pt unable to tolerate any ROM at knee due to swelling LLE: Unable to fully assess due to pain LLE Sensation: WNL LLE Coordination: WNL    Cervical / Trunk Assessment Cervical / Trunk Assessment: Normal  Communication   Communication: No difficulties  Cognition Arousal/Alertness: Awake/alert Behavior During Therapy: Flat affect Overall Cognitive Status: Impaired/Different from baseline Area of Impairment: Safety/judgement;Problem solving  Safety/Judgement: Decreased awareness of safety   Problem Solving: Requires verbal cues General Comments: benefits from verbal cues for safety and mobility.  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      General Comments General comments (skin integrity, edema, etc.): significant swelling, open sores, and weeping in LLE. RN states wound care has been consulted    Exercises     Assessment/Plan    PT Assessment Patient needs continued PT services  PT Problem List Decreased strength;Decreased activity tolerance;Decreased balance;Decreased mobility;Decreased safety awareness;Pain       PT Treatment Interventions Balance training;DME instruction;Gait training;Stair training;Functional mobility training;Therapeutic activities;Therapeutic exercise;Patient/family education    PT Goals (Current goals can be found in the Care Plan section)  Acute Rehab PT Goals Patient Stated Goal: return home able to take care of self and cousin PT Goal Formulation: With patient Time For Goal Achievement: 09/20/20 Potential to Achieve Goals: Good    Frequency Min 3X/week   Barriers to discharge Decreased caregiver support pt is primary caretaker for his cousin, states he has no assist at home       AM-PAC PT "6 Clicks" Mobility  Outcome Measure Help needed turning from your back to your side while in a flat bed without using bedrails?: A Little Help needed moving from lying on your back to sitting on the side of a flat bed without using bedrails?: A Little Help needed moving to and from a bed to a chair (including a wheelchair)?: A Lot Help needed standing up from a chair using your arms (e.g., wheelchair or bedside chair)?: A Lot Help needed to walk in hospital room?: Total Help needed climbing 3-5 steps with a railing? : Total 6 Click Score: 12    End of Session   Activity Tolerance: Patient limited by pain Patient left: in bed;with call bell/phone within reach;with nursing/sitter in room (on bed pan) Nurse Communication: Mobility  status PT Visit Diagnosis: Unsteadiness on feet (R26.81);Other abnormalities of gait and mobility (R26.89);Muscle weakness (generalized) (M62.81)    Time: 8250-5397 PT Time Calculation (min) (ACUTE ONLY): 16 min   Charges:   PT Evaluation $PT Re-evaluation: 1 Re-eval          Rolm Baptise, PT, DPT   Acute Rehabilitation Department Pager #: [phone]  Gaetana Michaelis 09/06/2020, 10:48 AM

## 2020-09-06 NOTE — Anesthesia Postprocedure Evaluation (Signed)
Anesthesia Post Note  Patient: Connor [name]  Procedure(s) Performed: TRANSESOPHAGEAL ECHOCARDIOGRAM (TEE) (N/A ) BUBBLE STUDY     Patient location during evaluation: Endoscopy Anesthesia Type: MAC Level of consciousness: awake and alert Pain management: pain level controlled Vital Signs Assessment: post-procedure vital signs reviewed and stable Respiratory status: spontaneous breathing, nonlabored ventilation, respiratory function stable and patient connected to nasal cannula oxygen Cardiovascular status: stable and blood pressure returned to baseline Postop Assessment: no apparent nausea or vomiting Anesthetic complications: no   No complications documented.  Last Vitals:  Vitals:   09/06/20 0400 09/06/20 0743  BP: (!) 142/80 (!) 147/81  Pulse: 83 92  Resp: 19 20  Temp: 36.8 C 36.6 C  SpO2: 96% 95%    Last Pain:  Vitals:   09/06/20 0743  TempSrc: Oral  PainSc:                  [place]

## 2020-09-06 NOTE — Progress Notes (Signed)
PROGRESS NOTE  Connor [name]  DOB: [birth year]  PCP: Patient, No Pcp Per RFF:[id number]  DOA: 08/30/2020  LOS: 7 days   Chief Complaint  Patient presents with  . Fever    Brief narrative: Connor [name] is a 65 y.o. male with PMH significant for HTN, CKD 3, A. 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  Patient presented to the ED on 1/4 with 1 week history of nausea, vomiting, chills followed by worsening left leg pain and swelling.  In the ED, he was noted to be in severe sepsis with WBC count elevated to 15, lactic acid 2, creatinine 4.2 with a baseline of 1.3, potassium low at 2.8.  CK level was elevated more than 28,000. Ultrasound rule out DVT.   CT scan of left leg showed soft tissue swelling but no muscle damage and no signs of abscess although limited due to lack of contrast and CT due to renal function.  MRI noted signs of myofascitis.   Patient was admitted to hospitalist service for cellulitis, rhabdomyolysis, AKI. Orthopedic and nephrology consultation were obtained. Blood cultures sent on admission grew Streptococcus group G, cellulitis likely being the source.    Over the course of hospitalization, sepsis, rhabdomyolysis improved.  His creatinine is still elevated but he is making adequate urine output.   Because of bacteremia in the setting of aortic graft, he needs TEE and hence transferred management to [facility] on 1/8.  Subjective: Patient was seen and examined this morning.  Lying in bed.  Not in distress.  Feels better.  Wants to go home because he has an autistic brother to take care of.  But understands the need to continue the care in the hospital.  Assessment/Plan: Left leg cellulitis/myofascitis Bacteremia with Streptococcus group  G -Presented with left leg cellulitis.  Blood culture obtained on admission grew Streptococcus group G.  Currently on IV Rocephin.  Repeat blood culture from 1/8 did not show any growth.  TEE 1/10 did not show any evidence of vegetation.  -However, left leg still looks grossly swollen with areas of weeping and slough.  Will get ID and Ortho consultation.    Severe sepsis - POA -Secondary to platelets cellulitis.  Sepsis resolved.  Parameters improving as below. Recent Labs  Lab 08/30/20 1947 08/30/20 2215 08/31/20 0154 08/31/20 0414 09/01/20 0532 09/01/20 1026 09/01/20 1538 09/02/20 0416 09/03/20 0419 09/04/20 1446 09/05/20 0420 09/06/20 0441  WBC 15.6*  --   --  18.3*   < >  --    < > 22.8* 25.8* 20.4* 18.9* 14.9*  LATICACIDVEN 2.1* 2.4* 1.5 1.4  --  0.9  --   --   --   --   --   --   PROCALCITON  --   --   --   --    < > 43.41  --  34.97 20.62 7.47 5.24 2.99   < > = values in this interval not displayed.   Acute kidney injury -Baseline creatinine less than 1.4.  Patient with elevated creatinine of 4.2, peaked at 7.6 on 1/7.  AKI to be due to acute tubular injury and decreased renal perfusion in the setting of rhabdomyolysis. Creatinine improving gradually.  Discussed with nephrology this morning.  Patient has been adequately hydrated.  We will  stop IV fluid today. -Continue to monitor.  Nephrology following. Recent Labs    05/20/20 1615 08/30/20 1947 08/31/20 0414 08/31/20 1912 09/01/20 0532 09/01/20 1538 09/02/20 0416 09/03/20 0419 09/04/20 1446 09/05/20 0420 09/06/20 0441  BUN 11 55* 54*  --  78* 84* 91* 99* 87* 79* 67*  CREATININE 1.38* 4.21* 4.56* 5.77* 6.51* 7.17* 7.61* 7.46* 5.79* 4.96* 3.73*   Rhabdomyolysis  Elevated transaminases -Secondary to left leg myofascitis -CK level and transaminases level are trending down as below with IV fluid. -IV fluid is stopped today. Recent Labs  Lab 08/31/20 1418 09/01/20 0532 09/02/20 0416 09/03/20 0419 09/04/20 1446  09/05/20 0420 09/06/20 0441  CKTOTAL 28,083* 24,138* 37,858* 8,688* 3,244* 2,084* 1,066*   Recent Labs  Lab 08/31/20 0414 09/01/20 0532 09/01/20 1538 09/02/20 0416 09/04/20 1446  AST 274* 220* 193* 173* 75*  ALT 58* 56* 54* 54* 45*  ALKPHOS 59 75 77 84 80  BILITOT 4.1* 3.2* 2.8* 2.3* 1.3*  PROT 6.5 6.3* 6.1* 6.4* 5.7*  ALBUMIN 2.6* 2.5* 2.2* 2.3* 1.9*   Essential hypertension Chronic diastolic dysfunction -Home meds include labetalol 200 mg twice daily, nifedipine 90 mg daily, Aldactone 25 mg daily, valsartan 80 mg daily -Meds initially held because of sepsis.  -Currently on labetalol 200 mg twice daily and nifedipine 90 mg daily.  Aldactone and valsartan are on hold.  Blood pressure mostly in normal range. -Echo from 1/6 with EF 60 to 65% and grade 2 diastolic dysfunction.  Persistent A. Fib -Patient has history of A. fib and DC cardioversion. -On admission, he was noted to be in A. fib and was started on amiodarone drip. Currently off amiodarone drip. On labetalol. Prior to admission, patient was on Eliquis for anticoagulation.  It was switched to heparin drip interventional procedure.  I wanted to start him on Eliquis this morning but I am not sure if orthopedics need to do any procedures.  So I will hold off on reinitiating Eliquis.    History of type aortic arch dissection  -He is status post repair in 2016 and had dissection at anastomosis suture line seen December 2019.  Course was complicated by cardiogenic shock with EF of 15%.  Ultimately underwent total arch replacement in February 2020 while at [place].  -Continue aspirin and Eliquis. ?Not on a statin. -Lipid panel with HDL less than 10. Will discuss with cardiology about any specific statin/fibrate he will benefit from.  Chronic hypokalemia -Potassium level running low mostly between 3-3.5.  Replacement ordered. Recent Labs  Lab 08/31/20 0414 09/01/20 0532 09/02/20 0416 09/03/20 0419 09/04/20 1446  09/05/20 0420 09/06/20 0441  K 3.3*   < > 3.4* 3.4* 3.0* 3.1* 3.3*  MG 2.0  --   --   --   --   --   --   PHOS 3.5  --   --   --   --   --   --    < > = values in this interval not displayed.   Hyperglycemia -No history of diabetes.  A1c 4.6 on 1/7. -Fingersticks consistently less than 150. Recent Labs  Lab 08/30/20 1940 09/03/20 0745 09/04/20 0741 09/05/20 1509  GLUCAP 163* 100* 104* 116*   Mobility: Encourage ambulation Code Status:   Code Status: Full Code  Nutritional status: Body mass index is 34.56 kg/m.     Diet Order            Diet heart healthy/carb modified Room service appropriate? Yes; Fluid consistency: Thin  Diet  effective now                 DVT prophylaxis: SCDs Start: 08/30/20 2133   Antimicrobials:  IV Rocephin Fluid: Stop IV fluid today Consultants: Nephrology, cardiology, ID, orthopedics Family Communication:  None at bedside  Status is: Inpatient  Remains inpatient appropriate because: Ongoing work-up for sepsis, AKI, cellulitis care Dispo: The patient is from: Home              Anticipated d/c is to: Home likely              Anticipated d/c date is: > 3 days              Patient currently is not medically stable to d/c.    Infusions:  . cefTRIAXone (ROCEPHIN)  IV 2 g (09/05/20 2137)    Scheduled Meds: . aspirin EC  81 mg Oral Daily  . Chlorhexidine Gluconate Cloth  6 each Topical Q0600  . feeding supplement  237 mL Oral BID BM  . labetalol  200 mg Oral BID  . mouth rinse  15 mL Mouth Rinse BID  . sodium chloride flush  10-40 mL Intracatheter Q12H    Antimicrobials: Anti-infectives (From admission, onward)   Start     Dose/Rate Route Frequency Ordered Stop   09/02/20 2200  cefTRIAXone (ROCEPHIN) 2 g in sodium chloride 0.9 % 100 mL IVPB        2 g 200 mL/hr over 30 Minutes Intravenous Every 24 hours 09/02/20 1044     09/01/20 2200  vancomycin (VANCOREADY) IVPB 1500 mg/300 mL  Status:  Discontinued        1,500 mg 150 mL/hr  over 120 Minutes Intravenous Every 48 hours 08/31/20 0350 08/31/20 1719   09/01/20 0130  ceFEPIme (MAXIPIME) 2 g in sodium chloride 0.9 % 100 mL IVPB  Status:  Discontinued        2 g 200 mL/hr over 30 Minutes Intravenous Every 24 hours 08/31/20 1812 09/02/20 1043   09/01/20 0100  metroNIDAZOLE (FLAGYL) IVPB 500 mg  Status:  Discontinued        500 mg 100 mL/hr over 60 Minutes Intravenous Every 8 hours 08/31/20 1751 08/31/20 1926   08/31/20 2200  ceFEPIme (MAXIPIME) 2 g in sodium chloride 0.9 % 100 mL IVPB  Status:  Discontinued        2 g 200 mL/hr over 30 Minutes Intravenous Every 24 hours 08/31/20 0350 08/31/20 1719   08/31/20 2030  vancomycin (VANCOREADY) IVPB 1500 mg/300 mL        1,500 mg 150 mL/hr over 120 Minutes Intravenous  Once 08/31/20 2016 08/31/20 2338   08/31/20 2015  clindamycin (CLEOCIN) IVPB 900 mg  Status:  Discontinued        900 mg 100 mL/hr over 30 Minutes Intravenous Every 8 hours 08/31/20 1926 09/01/20 1033   08/31/20 1812  vancomycin variable dose per unstable renal function (pharmacist dosing)  Status:  Discontinued         Does not apply See admin instructions 08/31/20 1812 09/01/20 1033   08/31/20 1730  cefTRIAXone (ROCEPHIN) 2 g in sodium chloride 0.9 % 100 mL IVPB  Status:  Discontinued        2 g 200 mL/hr over 30 Minutes Intravenous Every 24 hours 08/31/20 1720 08/31/20 1751   08/31/20 1015  metroNIDAZOLE (FLAGYL) IVPB 500 mg  Status:  Discontinued        500 mg 100 mL/hr over 60 Minutes Intravenous Every 8  hours 08/31/20 1012 08/31/20 1719   08/31/20 0045  ceFEPIme (MAXIPIME) 2 g in sodium chloride 0.9 % 100 mL IVPB        2 g 200 mL/hr over 30 Minutes Intravenous  Once 08/31/20 0030 08/31/20 0208   08/31/20 0000  vancomycin (VANCOREADY) IVPB 1500 mg/300 mL        1,500 mg 150 mL/hr over 120 Minutes Intravenous  Once 08/30/20 2351 08/31/20 0208   08/30/20 1900  cefTRIAXone (ROCEPHIN) 2 g in sodium chloride 0.9 % 100 mL IVPB  Status:  Discontinued         2 g 200 mL/hr over 30 Minutes Intravenous Every 24 hours 08/30/20 1855 08/31/20 0030      PRN meds: HYDROmorphone (DILAUDID) injection, ondansetron (ZOFRAN) IV, oxyCODONE, sodium chloride flush   Objective: Vitals:   09/06/20 0400 09/06/20 0743  BP: (!) 142/80 (!) 147/81  Pulse: 83 92  Resp: 19 20  Temp: 98.3 F (36.8 C) 97.9 F (36.6 C)  SpO2: 96% 95%    Intake/Output Summary (Last 24 hours) at 09/06/2020 0925 Last data filed at 09/06/2020 0743 Gross per 24 hour  Intake 2094 ml  Output 2425 ml  Net -331 ml   Filed Weights   09/01/20 0540 09/03/20 0403 09/03/20 1500  Weight: 114.8 kg 118.1 kg 112.4 kg   Weight change:  Body mass index is 34.56 kg/m.   Physical Exam: General exam: Pleasant middle-aged African-American male.  Not in physical distress Skin: No rashes, lesions or ulcers. HEENT: Atraumatic, normocephalic, no obvious bleeding Lungs: Clear to auscultation bilaterally CVS: Regular rate and rhythm, no murmur GI/Abd soft, nontender, nondistended, bowel sound present CNS: Alert, awake, oriented x3. Psychiatry: Mood appropriate Extremities: Left foot and leg still is grossly swollen up to the knee.  There is area of exposed dermis with slough, oozing.  Data Review: I have personally reviewed the laboratory data and studies available.  Recent Labs  Lab 08/30/20 1947 08/31/20 0414 09/02/20 0416 09/03/20 0419 09/04/20 1446 09/05/20 0420 09/06/20 0441  WBC 15.6*   < > 22.8* 25.8* 20.4* 18.9* 14.9*  NEUTROABS 13.3*  --   --   --   --   --   --   HGB 10.5*   < > 8.2* 8.2* 7.1* 6.8* 7.5*  HCT 31.7*   < > 24.7* 23.7* 19.6* 19.0* 22.1*  MCV 91.1   < > 88.5 86.2 83.1 83.0 86.7  PLT 136*   < > 269 406* 510* 530* 538*   < > = values in this interval not displayed.   Recent Labs  Lab 08/31/20 0414 08/31/20 1912 09/02/20 0416 09/03/20 0419 09/04/20 1446 09/05/20 0420 09/06/20 0441  NA 134*   < > 138 138 142 144 144  K 3.3*   < > 3.4* 3.4* 3.0* 3.1* 3.3*   CL 102   < > 103 103 107 110 109  CO2 20*   < > 18* 20* 21* 23 23  GLUCOSE 143*   < > 106* 107* 124* 114* 99  BUN 54*   < > 91* 99* 87* 79* 67*  CREATININE 4.56*   < > 7.61* 7.46* 5.79* 4.96* 3.73*  CALCIUM 7.3*   < > 7.0* 7.6* 7.6* 7.7* 7.8*  MG 2.0  --   --   --   --   --   --   PHOS 3.5  --   --   --   --   --   --    < > =  values in this interval not displayed.    F/u labs ordered  Signed, Lorin GlassBinaya Kayshaun Polanco, MD [facility] 09/06/2020

## 2020-09-06 NOTE — Progress Notes (Signed)
EKG AND LABS COMPLETED PER ORDER. AFIBB ON EKG RATE98.  VOICES RELIEF OF PAIN SINCE PAIN MED GIVEN PER ORDER. SEE EMAR. VS WNL. CONTINUING TO MONITOR.

## 2020-09-06 NOTE — Progress Notes (Signed)
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 CPK improving. Hopeful he will continue to have renal recovery -no indications for renal replacement therapy as of right now. Monitor for auto-diuresis -can stop mIVF, encourage po hydration -abdominal ultrasound without obstruction -Avoid nephrotoxic medications including NSAIDs and iodinated intravenous contrast exposure unless the latter is absolutely indicated.  Preferred narcotic agents for pain control are hydromorphone, fentanyl, and methadone. Morphine should not be used. Avoid Baclofen and avoid oral sodium phosphate and magnesium citrate based laxatives / bowel preps. Continue strict Input and Output monitoring. 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 Aldactone and valsartan held currently -keep MAP>65  Metabolic acidosis, anion gap. resolved Stable HCO3  Chronic hypokalemia. 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 Was supposed to see endocrinology for this. Would be cautious with repletion if needed.  Normocytic Anemia -Transfuse for Hgb<7 g/dL. Avoid iron in the setting of bacteremia/acute infection   Subjective:     No complaints other  than pain in LLE which is better. Patient reports that he is starting to get his appetite back and can now drink more fluids.    Objective:   BP 140/87 (BP Location: Left Arm)   Pulse 73   Temp 98.1 F (36.7 C) (Oral)   Resp 16   Ht 5\' 11"  (1.803 m)   Wt 112.4 kg   SpO2 97%   BMI 34.56 kg/m   Intake/Output Summary (Last 24 hours) at 09/06/2020 1329 Last data filed at 09/06/2020 11/04/2020 Gross per 24 hour  Intake 1680 ml  Output 1950 ml  Net -270 ml   Weight change:   Physical Exam: Gen:NAD, pleasant, conversant CVS:reg rate Resp:cta bl, no w/r/r/c heard 8676, nt Ext:LLE edema (painful to touch), trace RLE edema Neuro: no asterixis, speech clear and coherent, moves all ext spontaneously  Imaging: ECHO TEE  Result Date: 09/05/2020    TRANSESOPHOGEAL ECHO REPORT   Patient Name:   Connor [name] Date of Exam: 09/05/2020 Medical Rec #:  11/03/2020     Height:       71.0 in Accession #:    [id number]    Weight:       247.8 lb Date of Birth:  07/21/1956    BSA:          2.310 m Patient Age:    64 years      BP:           134/61 mmHg Patient Gender: M             HR:           82 bpm. Exam Location:  Inpatient Procedure: Transesophageal Echo, Cardiac Doppler and Color Doppler Indications:     Bacteremia  History:         Patient has prior history of Echocardiogram examinations,  most                  recent 09/01/2020. Cardiomyopathy, Arrythmias:Atrial                  Fibrillation, Signs/Symptoms:Fever and Bacteremia; Risk                  Factors:Hypertension. Aortic dissection repair.  Sonographer:     Lavenia Atlas Referring Phys:  [id number] Ellsworth Lennox Diagnosing Phys: Laurance Flatten MD PROCEDURE: The transesophogeal probe was passed without difficulty through the esophogus of the patient. Sedation performed by different physician. The patient was monitored while under deep sedation. Anesthestetic sedation was provided intravenously by Anesthesiology: 346.95mg  of Propofol, 50mg   of Lidocaine. The patient developed no complications during the procedure. IMPRESSIONS  1. Left ventricular ejection fraction, by estimation, is 60 to 65%. The left ventricle has normal function.  2. Right ventricular systolic function is normal. The right ventricular size is normal. Pulmonary artery systolic pressure is 34mmHg+ RAP.  3. Left atrial size was severely dilated. No left atrial/left atrial appendage thrombus was detected.  4. The mitral valve is normal in structure. Trivial mitral valve regurgitation.  5. The aortic valve is tricuspid. Aortic valve regurgitation is trivial. No aortic stenosis is present.  6. Agitated saline contrast bubble study was positive with shunting observed after >6 cardiac cycles suggestive of intrapulmonary shunting.  7. Right atrial size was severely dilated.  8. No valvular vegetations visualized. FINDINGS  Left Ventricle: Left ventricular ejection fraction, by estimation, is 60 to 65%. The left ventricle has normal function. The left ventricular internal cavity size was normal in size. Right Ventricle: The right ventricular size is normal. No increase in right ventricular wall thickness. Right ventricular systolic function is normal. Pulmonary artery systolic pressure is 19mmHg+RAP. Left Atrium: Left atrial size was severely dilated. No left atrial/left atrial appendage thrombus was detected. Right Atrium: Right atrial size was severely dilated. Pericardium: There is no evidence of pericardial effusion. Mitral Valve: The mitral valve is normal in structure. There is mild thickening of the mitral valve leaflet(s). Trivial mitral valve regurgitation. There is no evidence of mitral valve vegetation. Tricuspid Valve: The tricuspid valve is normal in structure. Tricuspid valve regurgitation is mild. There is no evidence of tricuspid valve vegetation. Aortic Valve: The aortic valve is tricuspid. Aortic valve regurgitation is trivial. No aortic stenosis is present. There is no  evidence of aortic valve vegetation. Pulmonic Valve: The pulmonic valve was normal in structure. Pulmonic valve regurgitation is trivial. There is no evidence of pulmonic valve vegetation. Aorta: The aortic root and ascending aorta are structurally normal, with no evidence of dilitation. IAS/Shunts: No atrial level shunt detected by color flow Doppler. Agitated saline contrast bubble study was positive with shunting observed after >6 cardiac cycles suggestive of intrapulmonary shunting.  TRICUSPID VALVE TR Peak grad:   34.1 mmHg TR Vmax:        292.00 cm/s 36m MD Electronically signed by Laurance Flatten MD Signature Date/Time: 09/05/2020/3:50:11 PM    Final     Labs: BMET Recent Labs  Lab 08/31/20 10/29/20 08/31/20 1912 09/01/20 0532 09/01/20 1538 09/02/20 0416 09/03/20 0419 09/04/20 1446 09/05/20 0420 09/06/20 0441  NA 134*  --  135 137 138 138 142 144 144  K 3.3*  --  3.9 3.3* 3.4* 3.4* 3.0* 3.1* 3.3*  CL 102  --  102 102 103 103 107 110 109  CO2 20*  --  16* 20* 18*  20* 21* 23 23  GLUCOSE 143*  --  104* 101* 106* 107* 124* 114* 99  BUN 54*  --  78* 84* 91* 99* 87* 79* 67*  CREATININE 4.56*   < > 6.51* 7.17* 7.61* 7.46* 5.79* 4.96* 3.73*  CALCIUM 7.3*  --  7.0* 7.1* 7.0* 7.6* 7.6* 7.7* 7.8*  PHOS 3.5  --   --   --   --   --   --   --   --    < > = values in this interval not displayed.   CBC Recent Labs  Lab 08/30/20 1947 08/31/20 0414 09/03/20 0419 09/04/20 1446 09/05/20 0420 09/06/20 0441  WBC 15.6*   < > 25.8* 20.4* 18.9* 14.9*  NEUTROABS 13.3*  --   --   --   --   --   HGB 10.5*   < > 8.2* 7.1* 6.8* 7.5*  HCT 31.7*   < > 23.7* 19.6* 19.0* 22.1*  MCV 91.1   < > 86.2 83.1 83.0 86.7  PLT 136*   < > 406* 510* 530* 538*   < > = values in this interval not displayed.    Medications:    . aspirin EC  81 mg Oral Daily  . Chlorhexidine Gluconate Cloth  6 each Topical Q0600  . feeding supplement  237 mL Oral BID BM  . labetalol  200 mg Oral BID  . mouth rinse   15 mL Mouth Rinse BID  . sodium chloride flush  10-40 mL Intracatheter Q12H      [name], MD  [facility] 09/06/2020, 1:29 PM

## 2020-09-06 NOTE — Anesthesia Preprocedure Evaluation (Addendum)
Anesthesia Evaluation  Patient identified by MRN, date of birth, ID band Patient awake    Reviewed: Allergy & Precautions, NPO status , Patient's Chart, lab work & pertinent test results  History of Anesthesia Complications Negative for: history of anesthetic complications  Airway Mallampati: III  TM Distance: >3 FB Neck ROM: Full    Dental no notable dental hx. (+) Dental Advisory Given   Pulmonary Current Smoker and Patient abstained from smoking.,    + rhonchi        Cardiovascular hypertension, Pt. on home beta blockers and Pt. on medications + CABG  + dysrhythmias Atrial Fibrillation  Rhythm:Irregular Rate:Tachycardia  IMPRESSIONS    1. Left ventricular ejection fraction, by estimation, is 60 to 65%. The  left ventricle has normal function.  2. Right ventricular systolic function is normal. The right ventricular  size is normal. Pulmonary artery systolic pressure is 30mmHg+ RAP.  3. Left atrial size was severely dilated. No left atrial/left atrial  appendage thrombus was detected.  4. The mitral valve is normal in structure. Trivial mitral valve  regurgitation.  5. The aortic valve is tricuspid. Aortic valve regurgitation is trivial.  No aortic stenosis is present.  6. Agitated saline contrast bubble study was positive with shunting  observed after >6 cardiac cycles suggestive of intrapulmonary shunting.  7. Right atrial size was severely dilated.  8. No valvular vegetations visualized.    Neuro/Psych negative neurological ROS     GI/Hepatic negative GI ROS, Neg liver ROS,   Endo/Other  negative endocrine ROS  Renal/GU negative Renal ROS     Musculoskeletal negative musculoskeletal ROS (+)   Abdominal   Peds  Hematology negative hematology ROS (+) anemia ,   Anesthesia Other Findings   Reproductive/Obstetrics                            Anesthesia Physical Anesthesia  Plan  ASA: III  Anesthesia Plan: General   Post-op Pain Management:    Induction: Intravenous  PONV Risk Score and Plan: 2 and Ondansetron and Midazolam  Airway Management Planned: Oral ETT  Additional Equipment:   Intra-op Plan:   Post-operative Plan: Post-operative intubation/ventilation  Informed Consent: I have reviewed the patients History and Physical, chart, labs and discussed the procedure including the risks, benefits and alternatives for the proposed anesthesia with the patient or authorized representative who has indicated his/her understanding and acceptance.     Dental advisory given  Plan Discussed with: Anesthesiologist, CRNA and Surgeon  Anesthesia Plan Comments:        Anesthesia Quick Evaluation

## 2020-09-06 NOTE — Consult Note (Signed)
Reason for Consult:Left lower leg infection Referring Physician: B Dahal Time called: 1610 Time at bedside: 0942   Connor [name] is an 65 y.o. male.  HPI: Connor [name] was admitted about a week ago with sepsis, likely stemming from cellulitis of the left lower leg. He notes that it had been hurting for 4-5 days before presentation to the hospital. He denies fevers, chills, or sweats but was having nausea, vomiting, and diarrhea that may or may not have been related. He denies prior hx/o similar. An MRI was obtained that showed some myositis and orthopedic surgery was consulted. He states the leg is in about the same condition as it was on admission.  Past Medical History:  Diagnosis Date  . AAA (abdominal aortic aneurysm) (HCC)   . History of open heart surgery   . Hypertension   . Seroma due to trauma [facility])     Past Surgical History:  Procedure Laterality Date  . ABDOMINAL AORTIC ANEURYSM REPAIR    . BUBBLE STUDY  09/05/2020   Procedure: BUBBLE STUDY;  Surgeon: Meriam Sprague, MD;  Location: [facility] ENDOSCOPY;  Service: Cardiovascular;;  . CARDIOVERSION N/A 02/11/2020   Procedure: CARDIOVERSION;  Surgeon: Little Ishikawa, MD;  Location: [facility] ENDOSCOPY;  Service: Cardiovascular;  Laterality: N/A;  . CORONARY ARTERY BYPASS GRAFT    . TEE WITHOUT CARDIOVERSION N/A 09/05/2020   Procedure: TRANSESOPHAGEAL ECHOCARDIOGRAM (TEE);  Surgeon: Meriam Sprague, MD;  Location: [facility] ENDOSCOPY;  Service: Cardiovascular;  Laterality: N/A;    Family History  Problem Relation Age of Onset  . CAD Mother   . CVA Father     Social History:  reports that he has been smoking. He has been smoking about 0.50 packs per day. He has never used smokeless tobacco. He reports that he does not drink alcohol and does not use drugs.  Allergies: No Known Allergies  Medications: I have reviewed the patient's current medications.  Results for orders placed or performed during the hospital encounter of 08/30/20  (from the past 48 hour(s))  CK     Status: Abnormal   Collection Time: 09/04/20  2:46 PM  Result Value Ref Range   Total CK 3,244 (H) 49 - 397 U/L    Comment: Performed at [facility], [address], [place], [place] [address]  Basic metabolic panel     Status: Abnormal   Collection Time: 09/04/20  2:46 PM  Result Value Ref Range   Sodium 142 135 - 145 mmol/L   Potassium 3.0 (L) 3.5 - 5.1 mmol/L   Chloride 107 98 - 111 mmol/L   CO2 21 (L) 22 - 32 mmol/L   Glucose, Bld 124 (H) 70 - 99 mg/dL    Comment: Glucose reference range applies only to samples taken after fasting for at least 8 hours.   BUN 87 (H) 8 - 23 mg/dL   Creatinine, Ser 4.09 (H) 0.61 - 1.24 mg/dL   Calcium 7.6 (L) 8.9 - 10.3 mg/dL   GFR, Estimated 10 (L) >60 mL/min    Comment: (NOTE) Calculated using the CKD-EPI Creatinine Equation (2021)    Anion gap 14 5 - 15    Comment: Performed at [facility], [address], [place], [place] [address]  CBC     Status: Abnormal   Collection Time: 09/04/20  2:46 PM  Result Value Ref Range   WBC 20.4 (H) 4.0 - 10.5 K/uL   RBC 2.36 (L) 4.22 - 5.81 MIL/uL   Hemoglobin 7.1 (L)  13.0 - 17.0 g/dL   HCT 16.1 (L) 09.6 - 04.5 %   MCV 83.1 80.0 - 100.0 fL   MCH 30.1 26.0 - 34.0 pg   MCHC 36.2 (H) 30.0 - 36.0 g/dL   RDW 40.9 81.1 - 91.4 %   Platelets 510 (H) 150 - 400 K/uL   nRBC 0.0 0.0 - 0.2 %    Comment: Performed at [facility], [address] [place], [place] [address]  Procalcitonin     Status: None   Collection Time: 09/04/20  2:46 PM  Result Value Ref Range   Procalcitonin 7.47 ng/mL    Comment:        Interpretation: PCT > 2 ng/mL: Systemic infection (sepsis) is likely, unless other causes are known. (NOTE)       Sepsis PCT Algorithm           Lower Respiratory Tract                                      Infection PCT Algorithm    ----------------------------     ----------------------------         PCT < 0.25 ng/mL                PCT < 0.10  ng/mL          Strongly encourage             Strongly discourage   discontinuation of antibiotics    initiation of antibiotics    ----------------------------     -----------------------------       PCT 0.25 - 0.50 ng/mL            PCT 0.10 - 0.25 ng/mL               OR       >80% decrease in PCT            Discourage initiation of                                            antibiotics      Encourage discontinuation           of antibiotics    ----------------------------     -----------------------------         PCT >= 0.50 ng/mL              PCT 0.26 - 0.50 ng/mL               AND       <80% decrease in PCT              Encourage initiation of                                             antibiotics       Encourage continuation           of antibiotics    ----------------------------     -----------------------------        PCT >= 0.50 ng/mL                  PCT > 0.50 ng/mL  AND         increase in PCT                  Strongly encourage                                      initiation of antibiotics    Strongly encourage escalation           of antibiotics                                     -----------------------------                                           PCT <= 0.25 ng/mL                                                 OR                                        > 80% decrease in PCT                                      Discontinue / Do not initiate                                             antibiotics  Performed at [facility], [address] [place], [place] [address]   Lipid panel     Status: Abnormal   Collection Time: 09/04/20  2:46 PM  Result Value Ref Range   Cholesterol 110 0 - 200 mg/dL   Triglycerides 604173 (H) <150 mg/dL   HDL <54<10 (L) >09>40 mg/dL    Comment: REPEATED TO VERIFY   Total CHOL/HDL Ratio NOT CALCULATED RATIO   VLDL 35 0 - 40 mg/dL   LDL Cholesterol NOT CALCULATED 0 - 99 mg/dL    Comment: Performed at [facility], [address] [place], [place] [address]  Hepatic function panel     Status: Abnormal   Collection Time: 09/04/20  2:46 PM  Result Value Ref Range   Total Protein 5.7 (L) 6.5 - 8.1 g/dL   Albumin 1.9 (L) 3.5 - 5.0 g/dL   AST 75 (H) 15 - 41 U/L   ALT 45 (H) 0 - 44 U/L   Alkaline Phosphatase 80 38 - 126 U/L   Total Bilirubin 1.3 (H) 0.3 - 1.2 mg/dL   Bilirubin, Direct 0.5 (H) 0.0 - 0.2 mg/dL   Indirect Bilirubin 0.8 0.3 - 0.9 mg/dL    Comment: Performed at [facility], [address] [place], [place] [address]  APTT     Status: Abnormal   Collection Time: 09/04/20  2:47 PM  Result Value Ref Range  aPTT 77 (H) 24 - 36 seconds    Comment:        IF BASELINE aPTT IS ELEVATED, SUGGEST PATIENT RISK ASSESSMENT BE USED TO DETERMINE APPROPRIATE ANTICOAGULANT THERAPY. Performed at [facility], [address], [place], [place] [address]   Heparin level (unfractionated)     Status: None   Collection Time: 09/04/20  2:47 PM  Result Value Ref Range   Heparin Unfractionated 0.30 0.30 - 0.70 IU/mL    Comment: (NOTE) If heparin results are below expected values, and patient dosage has  been confirmed, suggest follow up testing of antithrombin III levels. Performed at [facility], [address] [place], [place] [address]   CK     Status: Abnormal   Collection Time: 09/05/20  4:20 AM  Result Value Ref Range   Total CK 2,084 (H) 49 - 397 U/L    Comment: Performed at [facility], [address] [place], [place] [address]  Basic metabolic panel     Status: Abnormal   Collection Time: 09/05/20  4:20 AM  Result Value Ref Range   Sodium 144 135 - 145 mmol/L   Potassium 3.1 (L) 3.5 - 5.1 mmol/L   Chloride 110 98 - 111 mmol/L   CO2 23 22 - 32 mmol/L   Glucose, Bld 114 (H) 70 - 99 mg/dL    Comment: Glucose reference range applies only to samples taken after fasting for at least 8 hours.   BUN 79 (H) 8 - 23 mg/dL   Creatinine, Ser 1.30 (H) 0.61 - 1.24 mg/dL    Calcium 7.7 (L) 8.9 - 10.3 mg/dL   GFR, Estimated 12 (L) >60 mL/min    Comment: (NOTE) Calculated using the CKD-EPI Creatinine Equation (2021)    Anion gap 11 5 - 15    Comment: Performed at [facility], [address], [place], [place] [address]  CBC     Status: Abnormal   Collection Time: 09/05/20  4:20 AM  Result Value Ref Range   WBC 18.9 (H) 4.0 - 10.5 K/uL   RBC 2.29 (L) 4.22 - 5.81 MIL/uL   Hemoglobin 6.8 (LL) 13.0 - 17.0 g/dL    Comment: REPEATED TO VERIFY THIS CRITICAL RESULT HAS VERIFIED AND BEEN CALLED TO LISA WILLIAMS RN BY MARSHA GARRETT ON 01 10 2022 AT 0555, AND HAS BEEN READ BACK.     HCT 19.0 (L) 39.0 - 52.0 %   MCV 83.0 80.0 - 100.0 fL   MCH 29.7 26.0 - 34.0 pg   MCHC 35.8 30.0 - 36.0 g/dL   RDW 46.9 62.9 - 52.8 %   Platelets 530 (H) 150 - 400 K/uL   nRBC 0.1 0.0 - 0.2 %    Comment: Performed at [facility], [address] [place], [place], [place] [address]  Procalcitonin     Status: None   Collection Time: 09/05/20  4:20 AM  Result Value Ref Range   Procalcitonin 5.24 ng/mL    Comment:        Interpretation: PCT > 2 ng/mL: Systemic infection (sepsis) is likely, unless other causes are known. (NOTE)       Sepsis PCT Algorithm           Lower Respiratory Tract                                      Infection  PCT Algorithm    ----------------------------     ----------------------------         PCT < 0.25 ng/mL                PCT < 0.10 ng/mL          Strongly encourage             Strongly discourage   discontinuation of antibiotics    initiation of antibiotics    ----------------------------     -----------------------------       PCT 0.25 - 0.50 ng/mL            PCT 0.10 - 0.25 ng/mL               OR       >80% decrease in PCT            Discourage initiation of                                            antibiotics      Encourage discontinuation           of antibiotics    ----------------------------     -----------------------------          PCT >= 0.50 ng/mL              PCT 0.26 - 0.50 ng/mL               AND       <80% decrease in PCT              Encourage initiation of                                             antibiotics       Encourage continuation           of antibiotics    ----------------------------     -----------------------------        PCT >= 0.50 ng/mL                  PCT > 0.50 ng/mL               AND         increase in PCT                  Strongly encourage                                      initiation of antibiotics    Strongly encourage escalation           of antibiotics                                     -----------------------------                                           PCT <= 0.25 ng/mL  OR                                        > 80% decrease in PCT                                      Discontinue / Do not initiate                                             antibiotics  Performed at [facility], [address] [place], [place] [address]   Heparin level (unfractionated)     Status: None   Collection Time: 09/05/20  4:20 AM  Result Value Ref Range   Heparin Unfractionated 0.69 0.30 - 0.70 IU/mL    Comment: (NOTE) If heparin results are below expected values, and patient dosage has  been confirmed, suggest follow up testing of antithrombin III levels. Performed at [facility], [address], [place], [place] [address]   APTT     Status: Abnormal   Collection Time: 09/05/20  4:20 AM  Result Value Ref Range   aPTT 154 (H) 24 - 36 seconds    Comment:        IF BASELINE aPTT IS ELEVATED, SUGGEST PATIENT RISK ASSESSMENT BE USED TO DETERMINE APPROPRIATE ANTICOAGULANT THERAPY. Performed at [facility], [address] [place], [place] [address]   ABO/Rh     Status: None   Collection Time: 09/05/20  4:20 AM  Result Value Ref Range   ABO/RH(D)      O POS Performed at [facility], [address],  [place], [place] [address]   Type and screen [facility]     Status: None   Collection Time: 09/05/20  6:30 AM  Result Value Ref Range   ABO/RH(D) O POS    Antibody Screen NEG    Sample Expiration 09/08/2020,2359    Unit Number [id number]    Blood Component Type RED CELLS,LR    Unit division 00    Status of Unit ISSUED,FINAL    Transfusion Status OK TO TRANSFUSE    Crossmatch Result      Compatible Performed at [facility], [address] [place], [place] [address]   Prepare RBC (crossmatch)     Status: None   Collection Time: 09/05/20  7:14 AM  Result Value Ref Range   Order Confirmation      ORDER PROCESSED BY BLOOD BANK Performed at [facility], [address] [place], [place] [address]   Glucose, capillary     Status: Abnormal   Collection Time: 09/05/20  3:09 PM  Result Value Ref Range   Glucose-Capillary 116 (H) 70 - 99 mg/dL    Comment: Glucose reference range applies only to samples taken after fasting for at least 8 hours.   Comment 1 Notify RN    Comment 2 Document in Chart   CK     Status: Abnormal   Collection Time: 09/06/20  4:41 AM  Result Value Ref Range   Total CK 1,066 (H) 49 - 397 U/L    Comment: Performed at [facility], [address], [place], [place] [address]  Basic metabolic panel     Status: Abnormal   Collection Time: 09/06/20  4:41 AM  Result Value Ref Range   Sodium 144 135 - 145 mmol/L   Potassium 3.3 (L) 3.5 - 5.1 mmol/L   Chloride 109 98 - 111 mmol/L   CO2 23 22 - 32 mmol/L   Glucose, Bld 99 70 - 99 mg/dL    Comment: Glucose reference range applies only to samples taken after fasting for at least 8 hours.   BUN 67 (H) 8 - 23 mg/dL   Creatinine, Ser 6.04 (H) 0.61 - 1.24 mg/dL   Calcium 7.8 (L) 8.9 - 10.3 mg/dL   GFR, Estimated 17 (L) >60 mL/min    Comment: (NOTE) Calculated using the CKD-EPI Creatinine Equation (2021)    Anion gap 12 5 - 15    Comment: Performed at [facility] [place] [facility], [address], [place], [place] [address]  CBC     Status: Abnormal   Collection Time: 09/06/20  4:41 AM  Result Value Ref Range   WBC 14.9 (H) 4.0 - 10.5 K/uL   RBC 2.55 (L) 4.22 - 5.81 MIL/uL   Hemoglobin 7.5 (L) 13.0 - 17.0 g/dL   HCT 11.9 (L) 14.7 - 82.9 %   MCV 86.7 80.0 - 100.0 fL   MCH 29.4 26.0 - 34.0 pg   MCHC 33.9 30.0 - 36.0 g/dL   RDW 56.2 (H) 13.0 - 86.5 %   Platelets 538 (H) 150 - 400 K/uL   nRBC 0.3 (H) 0.0 - 0.2 %    Comment: Performed at [facility], [address] [place], [place] [address]  Procalcitonin     Status: None   Collection Time: 09/06/20  4:41 AM  Result Value Ref Range   Procalcitonin 2.99 ng/mL    Comment:        Interpretation: PCT > 2 ng/mL: Systemic infection (sepsis) is likely, unless other causes are known. (NOTE)       Sepsis PCT Algorithm           Lower Respiratory Tract                                      Infection PCT Algorithm    ----------------------------     ----------------------------         PCT < 0.25 ng/mL                PCT < 0.10 ng/mL          Strongly encourage             Strongly discourage   discontinuation of antibiotics    initiation of antibiotics    ----------------------------     -----------------------------       PCT 0.25 - 0.50 ng/mL            PCT 0.10 - 0.25 ng/mL               OR       >80% decrease in PCT            Discourage initiation of                                            antibiotics  Encourage discontinuation           of antibiotics    ----------------------------     -----------------------------         PCT >= 0.50 ng/mL              PCT 0.26 - 0.50 ng/mL               AND       <80% decrease in PCT              Encourage initiation of                                             antibiotics       Encourage continuation           of antibiotics    ----------------------------     -----------------------------        PCT >= 0.50 ng/mL                  PCT > 0.50 ng/mL                AND         increase in PCT                  Strongly encourage                                      initiation of antibiotics    Strongly encourage escalation           of antibiotics                                     -----------------------------                                           PCT <= 0.25 ng/mL                                                 OR                                        > 80% decrease in PCT                                      Discontinue / Do not initiate                                             antibiotics  Performed at [facility], [address] [place], [place] [address]   Heparin level (unfractionated)     Status: Abnormal   Collection Time: 09/06/20  4:41 AM  Result Value Ref Range   Heparin Unfractionated 0.11 (L) 0.30 - 0.70 IU/mL    Comment: (NOTE) If heparin results are below expected values, and patient dosage has  been confirmed, suggest follow up testing of antithrombin III levels. Performed at [facility], [address], [place], [place] [address]   Vitamin B12     Status: None   Collection Time: 09/06/20  4:41 AM  Result Value Ref Range   Vitamin B-12 837 180 - 914 pg/mL    Comment: (NOTE) This assay is not validated for testing neonatal or myeloproliferative syndrome specimens for Vitamin B12 levels. Performed at [facility], [address], [place], [place] [address]   Folate     Status: None   Collection Time: 09/06/20  4:41 AM  Result Value Ref Range   Folate 8.6 >5.9 ng/mL    Comment: Performed at [facility], [address], [place], [place] [address]  Iron and TIBC     Status: Abnormal   Collection Time: 09/06/20  4:41 AM  Result Value Ref Range   Iron 79 45 - 182 ug/dL   TIBC 914 (L) 782 - 956 ug/dL   Saturation Ratios 39 17.9 - 39.5 %   UIBC 123 ug/dL    Comment: Performed at [facility], [address] [place], [place] [address]  Ferritin     Status: Abnormal   Collection  Time: 09/06/20  4:41 AM  Result Value Ref Range   Ferritin 1,090 (H) 24 - 336 ng/mL    Comment: Performed at [facility], [address] [place], [place] [address]  Reticulocytes     Status: Abnormal   Collection Time: 09/06/20  4:41 AM  Result Value Ref Range   Retic Ct Pct 3.2 (H) 0.4 - 3.1 %   RBC. 2.56 (L) 4.22 - 5.81 MIL/uL   Retic Count, Absolute 80.9 19.0 - 186.0 K/uL   Immature Retic Fract 28.0 (H) 2.3 - 15.9 %    Comment: Performed at [facility], [address] [place], [place] [address]    ECHO TEE  Result Date: 09/05/2020    TRANSESOPHOGEAL ECHO REPORT   Patient Name:   LARSON LIMONES Date of Exam: 09/05/2020 Medical Rec #:  [id number]     Height:       71.0 in Accession #:    [id number]    Weight:       247.8 lb Date of Birth:  03-Feb-1956    BSA:          2.310 m Patient Age:    64 years      BP:           134/61 mmHg Patient Gender: M             HR:           82 bpm. Exam Location:  Inpatient Procedure: Transesophageal Echo, Cardiac Doppler and Color Doppler Indications:     Bacteremia  History:         Patient has prior history of Echocardiogram examinations, most                  recent 09/01/2020. Cardiomyopathy, Arrythmias:Atrial                  Fibrillation, Signs/Symptoms:Fever and Bacteremia; Risk                  Factors:Hypertension. Aortic dissection repair.  Sonographer:     Lavenia Atlas Referring Phys:  [id number] Ellsworth Lennox Diagnosing Phys: Laurance Flatten MD PROCEDURE: The transesophogeal probe was passed without difficulty through the esophogus of the patient. Sedation performed by different physician. The patient was monitored while under deep sedation. Anesthestetic sedation was provided intravenously by Anesthesiology: 346.95mg  of Propofol, 50mg  of Lidocaine. The patient developed no complications during the procedure. IMPRESSIONS  1. Left ventricular ejection fraction, by estimation, is 60 to 65%. The left ventricle has normal function.  2.  Right ventricular systolic function is normal. The right ventricular size is normal. Pulmonary artery systolic pressure is 41mmHg+ RAP.  3. Left atrial size was severely dilated. No left atrial/left atrial appendage thrombus was detected.  4. The mitral valve is normal in structure. Trivial mitral valve regurgitation.  5. The aortic valve is tricuspid. Aortic valve regurgitation is trivial. No aortic stenosis is present.  6. Agitated saline contrast bubble study was positive with shunting observed after >6 cardiac cycles suggestive of intrapulmonary shunting.  7. Right atrial size was severely dilated.  8. No valvular vegetations visualized. FINDINGS  Left Ventricle: Left ventricular ejection fraction, by estimation, is 60 to 65%. The left ventricle has normal function. The left ventricular internal cavity size was normal in size. Right Ventricle: The right ventricular size is normal. No increase in right ventricular wall thickness. Right ventricular systolic function is normal. Pulmonary artery systolic pressure is 76mmHg+RAP. Left Atrium: Left atrial size was severely dilated. No left atrial/left atrial appendage thrombus was detected. Right Atrium: Right atrial size was severely dilated. Pericardium: There is no evidence of pericardial effusion. Mitral Valve: The mitral valve is normal in structure. There is mild thickening of the mitral valve leaflet(s). Trivial mitral valve regurgitation. There is no evidence of mitral valve vegetation. Tricuspid Valve: The tricuspid valve is normal in structure. Tricuspid valve regurgitation is mild. There is no evidence of tricuspid valve vegetation. Aortic Valve: The aortic valve is tricuspid. Aortic valve regurgitation is trivial. No aortic stenosis is present. There is no evidence of aortic valve vegetation. Pulmonic Valve: The pulmonic valve was normal in structure. Pulmonic valve regurgitation is trivial. There is no evidence of pulmonic valve vegetation. Aorta: The  aortic root and ascending aorta are structurally normal, with no evidence of dilitation. IAS/Shunts: No atrial level shunt detected by color flow Doppler. Agitated saline contrast bubble study was positive with shunting observed after >6 cardiac cycles suggestive of intrapulmonary shunting.  TRICUSPID VALVE TR Peak grad:   34.1 mmHg TR Vmax:        292.00 cm/s 36m MD Electronically signed by Laurance Flatten MD Signature Date/Time: 09/05/2020/3:50:11 PM    Final     Review of Systems  Constitutional: Negative for chills, diaphoresis and fever.  HENT: Negative for ear discharge, ear pain, hearing loss and tinnitus.   Eyes: Negative for photophobia and pain.  Respiratory: Negative for cough and shortness of breath.   Cardiovascular: Negative for chest pain.  Gastrointestinal: Negative for abdominal pain, nausea and vomiting.  Genitourinary: Negative for dysuria, flank pain, frequency and urgency.  Musculoskeletal: Positive for arthralgias (Left leg). Negative for back pain, myalgias and neck pain.  Neurological: Negative for dizziness and headaches.  Hematological: Does not bruise/bleed easily.  Psychiatric/Behavioral: The patient is not nervous/anxious.    Blood pressure (!) 147/81, pulse 92, temperature 97.9 F (36.6 C), temperature source Oral, resp. rate 20, height 5\' 11"  (1.803 m), weight 112.4 kg, SpO2 95 %. Physical Exam Constitutional:  General: He is not in acute distress.    Appearance: He is well-developed and well-nourished. He is not diaphoretic.  HENT:     Head: Normocephalic and atraumatic.  Eyes:     General: No scleral icterus.       Right eye: No discharge.        Left eye: No discharge.     Conjunctiva/sclera: Conjunctivae normal.  Cardiovascular:     Rate and Rhythm: Normal rate and regular rhythm.  Pulmonary:     Effort: Pulmonary effort is normal. No respiratory distress.  Musculoskeletal:     Cervical back: Normal range of motion.      Comments: LLE No traumatic wounds, ecchymosis, or rash, lower leg with 3+ edema, patchy induration, weeping, diffuse tenderness but compartments compressible  No knee or ankle effusion  Knee stable to varus/ valgus and anterior/posterior stress  Sens DPN, SPN, TN intact  Motor EHL, ext, flex, evers 5/5  DP 1+, PT 0, 3+ edema  Skin:    General: Skin is warm and dry.  Neurological:     Mental Status: He is alert.  Psychiatric:        Mood and Affect: Mood and affect normal.        Behavior: Behavior normal.     Assessment/Plan: Left lower leg infection -- Dr. Lajoyce Cornersuda to evaluate later today or in AM. Doubt surgical remedy at this time, will need to see how it evolves. Continue present antibiotics. Multiple medical problems including aortic dissection, A. fib on Eliquis hypertension and obesity -- per primary service    Freeman CaldronMichael J. Blanchard Willhite, PA-C Orthopedic Surgery [phone] 09/06/2020, 10:12 AM

## 2020-09-06 NOTE — H&P (View-Only) (Signed)
ORTHOPAEDIC CONSULTATION  REQUESTING PHYSICIAN: Lorin Glass, MD  Chief Complaint: Swelling ulceration and drainage and extreme pain in left leg  HPI: Connor [name] is a 65 y.o. male who presents with acute swelling ulceration and pain with drainage left leg.  Patient does have a cardiac history with history of abdominal aortic aneurysm repair and coronary artery bypass surgery.  Past Medical History:  Diagnosis Date  . AAA (abdominal aortic aneurysm) (HCC)   . History of open heart surgery   . Hypertension   . Seroma due to trauma [facility])    Past Surgical History:  Procedure Laterality Date  . ABDOMINAL AORTIC ANEURYSM REPAIR    . BUBBLE STUDY  09/05/2020   Procedure: BUBBLE STUDY;  Surgeon: Meriam Sprague, MD;  Location: [facility] ENDOSCOPY;  Service: Cardiovascular;;  . CARDIOVERSION N/A 02/11/2020   Procedure: CARDIOVERSION;  Surgeon: Little Ishikawa, MD;  Location: [facility] ENDOSCOPY;  Service: Cardiovascular;  Laterality: N/A;  . CORONARY ARTERY BYPASS GRAFT    . TEE WITHOUT CARDIOVERSION N/A 09/05/2020   Procedure: TRANSESOPHAGEAL ECHOCARDIOGRAM (TEE);  Surgeon: Meriam Sprague, MD;  Location: [facility] ENDOSCOPY;  Service: Cardiovascular;  Laterality: N/A;   Social History   Socioeconomic History  . Marital status: Single    Spouse name: Not on file  . Number of children: Not on file  . Years of education: Not on file  . Highest education level: Not on file  Occupational History  . Not on file  Tobacco Use  . Smoking status: Current Every Day Smoker    Packs/day: 0.50  . Smokeless tobacco: Never Used  Vaping Use  . Vaping Use: Never used  Substance and Sexual Activity  . Alcohol use: Never  . Drug use: Never  . Sexual activity: Not on file  Other Topics Concern  . Not on file  Social History Narrative  . Not on file   Social Determinants of Health   Financial Resource Strain: Not on file  Food Insecurity: Not on file  Transportation Needs: Not on file   Physical Activity: Not on file  Stress: Not on file  Social Connections: Not on file   Family History  Problem Relation Age of Onset  . CAD Mother   . CVA Father    - negative except otherwise stated in the family history section No Known Allergies Prior to Admission medications   Medication Sig Start Date End Date Taking? Authorizing Provider  aspirin EC 81 MG tablet Take 81 mg by mouth daily. Swallow whole.   Yes [provider]  ELIQUIS 5 MG TABS tablet Take 1 tablet (5 mg total) by mouth 2 (two) times daily. 08/11/19  Yes Little Ishikawa, MD  labetalol (NORMODYNE) 200 MG tablet Take 2 tablets (400 mg total) by mouth 2 (two) times daily. Patient taking differently: Take 200 mg by mouth 2 (two) times daily. 09/08/19  Yes Little Ishikawa, MD  Multiple Vitamins-Minerals (MULTIVITAMIN WITH MINERALS) tablet Take 1 tablet by mouth daily. Men   Yes [provider]  NIFEdipine (ADALAT CC) 90 MG 24 hr tablet TAKE 1 TABLET(90 MG) BY MOUTH DAILY Patient taking differently: Take 90 mg by mouth daily. 06/17/20  Yes Little Ishikawa, MD  spironolactone (ALDACTONE) 25 MG tablet TAKE 1/2 TABLET(12.5 MG) BY MOUTH DAILY Patient taking differently: Take 25 mg by mouth daily. 07/07/20  Yes Little Ishikawa, MD  valsartan (DIOVAN) 80 MG tablet Take 1 tablet (80 mg total) by mouth daily. 11/20/19  Yes  Little Ishikawa, MD   ECHO TEE  Result Date: 09/05/2020    TRANSESOPHOGEAL ECHO REPORT   Patient Name:   Connor [name] Date of Exam: 09/05/2020 Medical Rec #:  [id number]     Height:       71.0 in Accession #:    [id number]    Weight:       247.8 lb Date of Birth:  1956-06-25    BSA:          2.310 m Patient Age:    64 years      BP:           134/61 mmHg Patient Gender: M             HR:           82 bpm. Exam Location:  Inpatient Procedure: Transesophageal Echo, Cardiac Doppler and Color Doppler Indications:     Bacteremia  History:         Patient has  prior history of Echocardiogram examinations, most                  recent 09/01/2020. Cardiomyopathy, Arrythmias:Atrial                  Fibrillation, Signs/Symptoms:Fever and Bacteremia; Risk                  Factors:Hypertension. Aortic dissection repair.  Sonographer:     Lavenia Atlas Referring Phys:  [id number] Ellsworth Lennox Diagnosing Phys: Laurance Flatten MD PROCEDURE: The transesophogeal probe was passed without difficulty through the esophogus of the patient. Sedation performed by different physician. The patient was monitored while under deep sedation. Anesthestetic sedation was provided intravenously by Anesthesiology: 346.95mg  of Propofol, 50mg  of Lidocaine. The patient developed no complications during the procedure. IMPRESSIONS  1. Left ventricular ejection fraction, by estimation, is 60 to 65%. The left ventricle has normal function.  2. Right ventricular systolic function is normal. The right ventricular size is normal. Pulmonary artery systolic pressure is 92mmHg+ RAP.  3. Left atrial size was severely dilated. No left atrial/left atrial appendage thrombus was detected.  4. The mitral valve is normal in structure. Trivial mitral valve regurgitation.  5. The aortic valve is tricuspid. Aortic valve regurgitation is trivial. No aortic stenosis is present.  6. Agitated saline contrast bubble study was positive with shunting observed after >6 cardiac cycles suggestive of intrapulmonary shunting.  7. Right atrial size was severely dilated.  8. No valvular vegetations visualized. FINDINGS  Left Ventricle: Left ventricular ejection fraction, by estimation, is 60 to 65%. The left ventricle has normal function. The left ventricular internal cavity size was normal in size. Right Ventricle: The right ventricular size is normal. No increase in right ventricular wall thickness. Right ventricular systolic function is normal. Pulmonary artery systolic pressure is 68mmHg+RAP. Left Atrium: Left atrial size was  severely dilated. No left atrial/left atrial appendage thrombus was detected. Right Atrium: Right atrial size was severely dilated. Pericardium: There is no evidence of pericardial effusion. Mitral Valve: The mitral valve is normal in structure. There is mild thickening of the mitral valve leaflet(s). Trivial mitral valve regurgitation. There is no evidence of mitral valve vegetation. Tricuspid Valve: The tricuspid valve is normal in structure. Tricuspid valve regurgitation is mild. There is no evidence of tricuspid valve vegetation. Aortic Valve: The aortic valve is tricuspid. Aortic valve regurgitation is trivial. No aortic stenosis is present. There is no evidence of aortic valve vegetation. Pulmonic Valve: The pulmonic valve  was normal in structure. Pulmonic valve regurgitation is trivial. There is no evidence of pulmonic valve vegetation. Aorta: The aortic root and ascending aorta are structurally normal, with no evidence of dilitation. IAS/Shunts: No atrial level shunt detected by color flow Doppler. Agitated saline contrast bubble study was positive with shunting observed after >6 cardiac cycles suggestive of intrapulmonary shunting.  TRICUSPID VALVE TR Peak grad:   34.1 mmHg TR Vmax:        292.00 cm/s Laurance Flatten MD Electronically signed by Laurance Flatten MD Signature Date/Time: 09/05/2020/3:50:11 PM    Final    - pertinent xrays, CT, MRI studies were reviewed and independently interpreted  Positive ROS: All other systems have been reviewed and were otherwise negative with the exception of those mentioned in the HPI and as above.  Physical Exam: General: Alert, no acute distress Psychiatric: Patient is competent for consent with normal mood and affect Lymphatic: No axillary or cervical lymphadenopathy Cardiovascular: No pedal edema Respiratory: No cyanosis, no use of accessory musculature GI: No organomegaly, abdomen is soft and non-tender    Images:  @ENCIMAGES @  Labs:  Lab  Results  Component Value Date   HGBA1C 4.6 (L) 09/02/2020   REPTSTATUS PENDING 09/03/2020   CULT  09/03/2020    NO GROWTH 3 DAYS Performed at [facility], [address] [place], [place]    [id number] ENTEROCOCCUS FAECALIS (A) 08/31/2020    Lab Results  Component Value Date   ALBUMIN 1.9 (L) 09/04/2020   ALBUMIN 2.3 (L) 09/02/2020   ALBUMIN 2.2 (L) 09/01/2020    Neurologic: Patient does not have protective sensation bilateral lower extremities.   MUSCULOSKELETAL:   Skin: Examination patient has significant swelling of the entire left lower extremity there are massive ulcers medially and laterally to the left leg with weeping edema.  Patient has extreme pain to palpation of the entire leg the thigh is not tender to palpation.  Patient's albumin is 1.9 hemoglobin A1c 4.6.  Patient has a good dorsalis pedis and posterior tibial pulse bilaterally.  Review of the MRI scan of the thigh and CT scan of the leg does not show air in the soft tissue does show cellulitis involving both the thigh and leg.  Review of patient's blood work his current hemoglobin is 7.5 his white cell count has decreased from 18.9-14.9.  His initial CK was 2084 currently 1066.  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  Plan: Plan: We will plan for urgent surgical intervention tomorrow morning anticipate medial and lateral fasciotomies to the left leg possible fasciotomies to the left thigh patient may require an amputation at time of surgery or placement of installation wound vacs for instillation therapy.  Anticipate returning to the operating room on Friday.  Thank you for the consult and the opportunity to see Mr. Connor Tanney, MD [facility] [phone] 1:21 PM

## 2020-09-06 NOTE — Progress Notes (Signed)
C/O chest pain 5/10 w/ movement on rt side of chest only. Denies nausea, dizziness, shortness of breath or any other symptoms. Pain 5/10 rt chest is sharp w/ movement only. LLE sharp pain 10/10. Will give pain med per order on emar and notified MD on call. Continuing to monitor.

## 2020-09-06 NOTE — TOC Initial Note (Addendum)
Transition of Care [facility]) - Initial/Assessment Note    Patient Details  Name: Connor [name] MRN: [id number] Date of Birth: [birth year]  Transition of Care [facility]) CM/SW Contact:    Terrial Rhodes, LCSWA Phone Number: 09/06/2020, 5:06 PM  Clinical Narrative:                  CSW received consult for possible SNF placement at time of discharge. CSW spoke with patient regarding PT recommendation of SNF placement at time of discharge. Patient comes from home with cousin. Patient expressed understanding of PT recommendation and is agreeable to SNF placement at time of discharge. Patient gave CSW permission to fax out initial referral near [place] area. Patient has NOT received the COVID vaccines.  No further questions reported at this time. CSW to continue to follow and assist with discharge planning needs.   Expected Discharge Plan: Skilled Nursing Facility Barriers to Discharge: Continued Medical Work up   Patient Goals and CMS Choice Patient states their goals for this hospitalization and ongoing recovery are:: to go to SNF CMS Medicare.gov Compare Post Acute Care list provided to:: Patient Choice offered to / list presented to : Patient  Expected Discharge Plan and Services Expected Discharge Plan: Skilled Nursing Facility In-house Referral: Clinical Social Work     Living arrangements for the past 2 months: Single Family Home                                      Prior Living Arrangements/Services Living arrangements for the past 2 months: Single Family Home Lives with:: Self,Relatives (lives with cousin) Patient language and need for interpreter reviewed:: Yes Do you feel safe going back to the place where you live?: No   SNF  Need for Family Participation in Patient Care: Yes (Comment) Care giver support system in place?: Yes (comment)   Criminal Activity/Legal Involvement Pertinent to Current Situation/Hospitalization: No - Comment as needed  Activities of  Daily Living Home Assistive Devices/Equipment: Cane (specify quad or straight) (uses his uncle's) ADL Screening (condition at time of admission) Patient's cognitive ability adequate to safely complete daily activities?: Yes Is the patient deaf or have difficulty hearing?: No Does the patient have difficulty seeing, even when wearing glasses/contacts?: No (reading glasses only) Does the patient have difficulty concentrating, remembering, or making decisions?: No Patient able to express need for assistance with ADLs?: Yes (at this time) Does the patient have difficulty dressing or bathing?: No Independently performs ADLs?: Yes (appropriate for developmental age) Does the patient have difficulty walking or climbing stairs?: Yes (at this time) Weakness of Legs: Both Weakness of Arms/Hands: None  Permission Sought/Granted Permission sought to share information with : Case Manager,Family Electrical engineer Permission granted to share information with : Yes, Verbal Permission Granted  Share Information with NAME: Joni Reining  Permission granted to share info w AGENCY: SNF  Permission granted to share info w Relationship: Niece  Permission granted to share info w Contact Information: [place] [phone]  Emotional Assessment Appearance:: Appears stated age Attitude/Demeanor/Rapport: Gracious Affect (typically observed): Calm Orientation: : Oriented to Self,Oriented to Place,Oriented to  Time,Oriented to Situation Alcohol / Substance Use: Not Applicable Psych Involvement: No (comment)  Admission diagnosis:  PAF (paroxysmal atrial fibrillation) (HCC) [I48.0] Elevated troponin [R77.8] Elevated LFTs [R79.89] Cellulitis of left leg [L03.116] AKI (acute kidney injury) (HCC) [N17.9] Sepsis (HCC) [A41.9] Sepsis with acute renal failure without septic shock, due to  unspecified organism, unspecified acute renal failure type (HCC) [A41.9, R65.20, N17.9] Patient Active Problem List    Diagnosis Date Noted  . Sepsis with acute renal failure without septic shock (HCC)   . CKD (chronic kidney disease), stage III (HCC) 09/01/2020  . Morbid obesity (HCC) 09/01/2020  . Severe sepsis with acute organ dysfunction due to Streptococcus species (HCC) 08/30/2020  . Cellulitis of left leg 08/30/2020  . Lactic acidosis 08/30/2020  . Elevated troponin 08/30/2020  . Hypoalbuminemia 08/30/2020  . Leukocytosis 08/30/2020  . Thrombocytopenia (HCC) 08/30/2020  . Hypokalemia 08/30/2020  . Hyperglycemia 08/30/2020  . Transaminitis 08/30/2020  . Total bilirubin, elevated 08/30/2020  . AKI (acute kidney injury) (HCC) 08/30/2020  . Nausea vomiting and diarrhea 08/30/2020  . Hypertension 05/20/2020  . Persistent atrial fibrillation (HCC)    PCP:  Patient, No Pcp Per Pharmacy:   [id number] - [place], [place] - [address] [place] [address] Phone: [phone] Fax: [phone]     Social Determinants of Health (SDOH) Interventions    Readmission Risk Interventions No flowsheet data found.

## 2020-09-06 NOTE — Progress Notes (Signed)
   09/06/20 1046  Clinical Encounter Type  Visited With Patient  Visit Type Initial  Referral From Nurse  Consult/Referral To Chaplain  Spiritual Encounters  Spiritual Needs Prayer;Emotional   Chaplain responded to consult request for prayer. Chaplain provided emotional support and prayed with the patient. Pt's medications were making him sleepy. Chaplain remains available as needed.  This note was prepared by Chaplain Resident, Tacy Learn, MDiv. For questions, please contact by phone at [phone].

## 2020-09-06 NOTE — Consult Note (Signed)
ORTHOPAEDIC CONSULTATION  REQUESTING PHYSICIAN: Lorin Glass, MD  Chief Complaint: Swelling ulceration and drainage and extreme pain in left leg  HPI: Connor [name] is a 65 y.o. male who presents with acute swelling ulceration and pain with drainage left leg.  Patient does have a cardiac history with history of abdominal aortic aneurysm repair and coronary artery bypass surgery.  Past Medical History:  Diagnosis Date  . AAA (abdominal aortic aneurysm) (HCC)   . History of open heart surgery   . Hypertension   . Seroma due to trauma [facility])    Past Surgical History:  Procedure Laterality Date  . ABDOMINAL AORTIC ANEURYSM REPAIR    . BUBBLE STUDY  09/05/2020   Procedure: BUBBLE STUDY;  Surgeon: Meriam Sprague, MD;  Location: [facility] ENDOSCOPY;  Service: Cardiovascular;;  . CARDIOVERSION N/A 02/11/2020   Procedure: CARDIOVERSION;  Surgeon: Little Ishikawa, MD;  Location: [facility] ENDOSCOPY;  Service: Cardiovascular;  Laterality: N/A;  . CORONARY ARTERY BYPASS GRAFT    . TEE WITHOUT CARDIOVERSION N/A 09/05/2020   Procedure: TRANSESOPHAGEAL ECHOCARDIOGRAM (TEE);  Surgeon: Meriam Sprague, MD;  Location: [facility] ENDOSCOPY;  Service: Cardiovascular;  Laterality: N/A;   Social History   Socioeconomic History  . Marital status: Single    Spouse name: Not on file  . Number of children: Not on file  . Years of education: Not on file  . Highest education level: Not on file  Occupational History  . Not on file  Tobacco Use  . Smoking status: Current Every Day Smoker    Packs/day: 0.50  . Smokeless tobacco: Never Used  Vaping Use  . Vaping Use: Never used  Substance and Sexual Activity  . Alcohol use: Never  . Drug use: Never  . Sexual activity: Not on file  Other Topics Concern  . Not on file  Social History Narrative  . Not on file   Social Determinants of Health   Financial Resource Strain: Not on file  Food Insecurity: Not on file  Transportation Needs: Not on file   Physical Activity: Not on file  Stress: Not on file  Social Connections: Not on file   Family History  Problem Relation Age of Onset  . CAD Mother   . CVA Father    - negative except otherwise stated in the family history section No Known Allergies Prior to Admission medications   Medication Sig Start Date End Date Taking? Authorizing Provider  aspirin EC 81 MG tablet Take 81 mg by mouth daily. Swallow whole.   Yes [provider]  ELIQUIS 5 MG TABS tablet Take 1 tablet (5 mg total) by mouth 2 (two) times daily. 08/11/19  Yes Little Ishikawa, MD  labetalol (NORMODYNE) 200 MG tablet Take 2 tablets (400 mg total) by mouth 2 (two) times daily. Patient taking differently: Take 200 mg by mouth 2 (two) times daily. 09/08/19  Yes Little Ishikawa, MD  Multiple Vitamins-Minerals (MULTIVITAMIN WITH MINERALS) tablet Take 1 tablet by mouth daily. Men   Yes [provider]  NIFEdipine (ADALAT CC) 90 MG 24 hr tablet TAKE 1 TABLET(90 MG) BY MOUTH DAILY Patient taking differently: Take 90 mg by mouth daily. 06/17/20  Yes Little Ishikawa, MD  spironolactone (ALDACTONE) 25 MG tablet TAKE 1/2 TABLET(12.5 MG) BY MOUTH DAILY Patient taking differently: Take 25 mg by mouth daily. 07/07/20  Yes Little Ishikawa, MD  valsartan (DIOVAN) 80 MG tablet Take 1 tablet (80 mg total) by mouth daily. 11/20/19  Yes  Schumann, Christopher L, MD   ECHO TEE  Result Date: 09/05/2020    TRANSESOPHOGEAL ECHO REPORT   Patient Name:   Chastin Dozier Date of Exam: 09/05/2020 Medical Rec #:  [id number]     Height:       71.0 in Accession #:    [id number]    Weight:       247.8 lb Date of Birth:  12/11/1955    BSA:          2.310 m Patient Age:    64 years      BP:           134/61 mmHg Patient Gender: M             HR:           82 bpm. Exam Location:  Inpatient Procedure: Transesophageal Echo, Cardiac Doppler and Color Doppler Indications:     Bacteremia  History:         Patient has  prior history of Echocardiogram examinations, most                  recent 09/01/2020. Cardiomyopathy, Arrythmias:Atrial                  Fibrillation, Signs/Symptoms:Fever and Bacteremia; Risk                  Factors:Hypertension. Aortic dissection repair.  Sonographer:     Brooke Strickland Referring Phys:  [id number] BRITTANY M STRADER Diagnosing Phys: Heather Pemberton MD PROCEDURE: The transesophogeal probe was passed without difficulty through the esophogus of the patient. Sedation performed by different physician. The patient was monitored while under deep sedation. Anesthestetic sedation was provided intravenously by Anesthesiology: 346.95mg of Propofol, 50mg of Lidocaine. The patient developed no complications during the procedure. IMPRESSIONS  1. Left ventricular ejection fraction, by estimation, is 60 to 65%. The left ventricle has normal function.  2. Right ventricular systolic function is normal. The right ventricular size is normal. Pulmonary artery systolic pressure is 34mmHg+ RAP.  3. Left atrial size was severely dilated. No left atrial/left atrial appendage thrombus was detected.  4. The mitral valve is normal in structure. Trivial mitral valve regurgitation.  5. The aortic valve is tricuspid. Aortic valve regurgitation is trivial. No aortic stenosis is present.  6. Agitated saline contrast bubble study was positive with shunting observed after >6 cardiac cycles suggestive of intrapulmonary shunting.  7. Right atrial size was severely dilated.  8. No valvular vegetations visualized. FINDINGS  Left Ventricle: Left ventricular ejection fraction, by estimation, is 60 to 65%. The left ventricle has normal function. The left ventricular internal cavity size was normal in size. Right Ventricle: The right ventricular size is normal. No increase in right ventricular wall thickness. Right ventricular systolic function is normal. Pulmonary artery systolic pressure is 34mmHg+RAP. Left Atrium: Left atrial size was  severely dilated. No left atrial/left atrial appendage thrombus was detected. Right Atrium: Right atrial size was severely dilated. Pericardium: There is no evidence of pericardial effusion. Mitral Valve: The mitral valve is normal in structure. There is mild thickening of the mitral valve leaflet(s). Trivial mitral valve regurgitation. There is no evidence of mitral valve vegetation. Tricuspid Valve: The tricuspid valve is normal in structure. Tricuspid valve regurgitation is mild. There is no evidence of tricuspid valve vegetation. Aortic Valve: The aortic valve is tricuspid. Aortic valve regurgitation is trivial. No aortic stenosis is present. There is no evidence of aortic valve vegetation. Pulmonic Valve: The pulmonic valve   was normal in structure. Pulmonic valve regurgitation is trivial. There is no evidence of pulmonic valve vegetation. Aorta: The aortic root and ascending aorta are structurally normal, with no evidence of dilitation. IAS/Shunts: No atrial level shunt detected by color flow Doppler. Agitated saline contrast bubble study was positive with shunting observed after >6 cardiac cycles suggestive of intrapulmonary shunting.  TRICUSPID VALVE TR Peak grad:   34.1 mmHg TR Vmax:        292.00 cm/s Laurance Flatten MD Electronically signed by Laurance Flatten MD Signature Date/Time: 09/05/2020/3:50:11 PM    Final    - pertinent xrays, CT, MRI studies were reviewed and independently interpreted  Positive ROS: All other systems have been reviewed and were otherwise negative with the exception of those mentioned in the HPI and as above.  Physical Exam: General: Alert, no acute distress Psychiatric: Patient is competent for consent with normal mood and affect Lymphatic: No axillary or cervical lymphadenopathy Cardiovascular: No pedal edema Respiratory: No cyanosis, no use of accessory musculature GI: No organomegaly, abdomen is soft and non-tender    Images:  @ENCIMAGES @  Labs:  Lab  Results  Component Value Date   HGBA1C 4.6 (L) 09/02/2020   REPTSTATUS PENDING 09/03/2020   CULT  09/03/2020    NO GROWTH 3 DAYS Performed at [facility], [address] [place], [place]    [id number] ENTEROCOCCUS FAECALIS (A) 08/31/2020    Lab Results  Component Value Date   ALBUMIN 1.9 (L) 09/04/2020   ALBUMIN 2.3 (L) 09/02/2020   ALBUMIN 2.2 (L) 09/01/2020    Neurologic: Patient does not have protective sensation bilateral lower extremities.   MUSCULOSKELETAL:   Skin: Examination patient has significant swelling of the entire left lower extremity there are massive ulcers medially and laterally to the left leg with weeping edema.  Patient has extreme pain to palpation of the entire leg the thigh is not tender to palpation.  Patient's albumin is 1.9 hemoglobin A1c 4.6.  Patient has a good dorsalis pedis and posterior tibial pulse bilaterally.  Review of the MRI scan of the thigh and CT scan of the leg does not show air in the soft tissue does show cellulitis involving both the thigh and leg.  Review of patient's blood work his current hemoglobin is 7.5 his white cell count has decreased from 18.9-14.9.  His initial CK was 2084 currently 1066.  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  Plan: Plan: We will plan for urgent surgical intervention tomorrow morning anticipate medial and lateral fasciotomies to the left leg possible fasciotomies to the left thigh patient may require an amputation at time of surgery or placement of installation wound vacs for instillation therapy.  Anticipate returning to the operating room on Friday.  Thank you for the consult and the opportunity to see Mr. Shyheim Tanney, MD [facility] [phone] 1:21 PM

## 2020-09-06 NOTE — NC FL2 (Signed)
[place] MEDICAID FL2 LEVEL OF CARE SCREENING TOOL     IDENTIFICATION  Patient Name: Connor [name] Birthdate: July 04, 1956 Sex: male Admission Date (Current Location): 08/30/2020  [place] and [place] Number:  [facility] [id number] R Facility and Address:  The [facility], [address] [place], [place] [address]      Provider Number: [id number]  Attending Physician Name and Address:  Lorin Glass, MD  Relative Name and Phone Number:  Joni Reining [phone]    Current Level of Care: Hospital Recommended Level of Care: Skilled Nursing Facility Prior Approval Number:    Date Approved/Denied:   PASRR Number: [id number]  Discharge Plan: SNF    Current Diagnoses: Patient Active Problem List   Diagnosis Date Noted  . Sepsis with acute renal failure without septic shock (HCC)   . CKD (chronic kidney disease), stage III (HCC) 09/01/2020  . Morbid obesity (HCC) 09/01/2020  . Severe sepsis with acute organ dysfunction due to Streptococcus species (HCC) 08/30/2020  . Cellulitis of left leg 08/30/2020  . Lactic acidosis 08/30/2020  . Elevated troponin 08/30/2020  . Hypoalbuminemia 08/30/2020  . Leukocytosis 08/30/2020  . Thrombocytopenia (HCC) 08/30/2020  . Hypokalemia 08/30/2020  . Hyperglycemia 08/30/2020  . Transaminitis 08/30/2020  . Total bilirubin, elevated 08/30/2020  . AKI (acute kidney injury) (HCC) 08/30/2020  . Nausea vomiting and diarrhea 08/30/2020  . Hypertension 05/20/2020  . Persistent atrial fibrillation (HCC)     Orientation RESPIRATION BLADDER Height & Weight     Self,Time,Situation,Place  O2 (2L) External catheter Weight: 247 lb 12.8 oz (112.4 kg) Height:  5\' 11"  (180.3 cm)  BEHAVIORAL SYMPTOMS/MOOD NEUROLOGICAL BOWEL NUTRITION STATUS      Continent Diet (See Discharge Summary)  AMBULATORY STATUS COMMUNICATION OF NEEDS Skin   Extensive Assist Verbally Other (Comment) (Cellulitis- left lower leg.)                        Personal Care Assistance Level of Assistance  Bathing,Dressing,Feeding Bathing Assistance: Maximum assistance Feeding assistance: Limited assistance Dressing Assistance: Maximum assistance     Functional Limitations Info  Sight,Speech,Hearing Sight Info: Adequate Hearing Info: Adequate Speech Info: Adequate    SPECIAL CARE FACTORS FREQUENCY  PT (By licensed PT),OT (By licensed OT)     PT Frequency: 5x weekly OT Frequency: 5x min weekly            Contractures Contractures Info: Not present    Additional Factors Info  Code Status,Allergies Code Status Info: Full code Allergies Info: No known allergies           Current Medications (09/06/2020):  This is the current hospital active medication list Current Facility-Administered Medications  Medication Dose Route Frequency Provider Last Rate Last Admin  . aspirin EC tablet 81 mg  81 mg Oral Daily 11/04/2020, MD   81 mg at 09/06/20 1031  . [START ON 09/07/2020] ceFAZolin (ANCEF) IVPB 2g/100 mL premix  2 g Intravenous On Call to OR Persons, 11/05/2020, PA      . cefTRIAXone (ROCEPHIN) 2 g in sodium chloride 0.9 % 100 mL IVPB  2 g Intravenous Q24H [place] Bali, MD 200 mL/hr at 09/05/20 2137 2 g at 09/05/20 2137  . Chlorhexidine Gluconate Cloth 2 % PADS 6 each  6 each Topical Q0600 2138, MD   6 each at 09/06/20 0411  . feeding supplement (ENSURE ENLIVE / ENSURE PLUS) liquid 237 mL  237 mL Oral BID BM Adefeso, Oladapo, DO  237 mL at 09/03/20 1047  . heparin ADULT infusion 100 units/mL (25000 units/226mL)  1,450 Units/hr Intravenous Continuous Dahal, Binaya, MD 14.5 mL/hr at 09/06/20 1342 1,450 Units/hr at 09/06/20 1342  . HYDROmorphone (DILAUDID) injection 0.5 mg  0.5 mg Intravenous Q4H PRN Alberteen Sam, MD   0.5 mg at 09/06/20 1620  . labetalol (NORMODYNE) tablet 200 mg  200 mg Oral BID Westley Chandler, MD   200 mg at 09/06/20 1031  . MEDLINE mouth rinse  15 mL Mouth Rinse BID  Alberteen Sam, MD   15 mL at 09/05/20 1033  . ondansetron (ZOFRAN) injection 4 mg  4 mg Intravenous Q6H PRN Adefeso, Oladapo, DO      . oxyCODONE (Oxy IR/ROXICODONE) immediate release tablet 5 mg  5 mg Oral Q4H PRN Alberteen Sam, MD   5 mg at 09/05/20 2122  . sodium chloride flush (NS) 0.9 % injection 10-40 mL  10-40 mL Intracatheter Q12H Dahal, Melina Schools, MD   10 mL at 09/05/20 2124  . sodium chloride flush (NS) 0.9 % injection 10-40 mL  10-40 mL Intracatheter PRN Lorin Glass, MD         Discharge Medications: Please see discharge summary for a list of discharge medications.  Relevant Imaging Results:  Relevant Lab Results:   Additional Information SSN: [id number]. Marland KitchenPatient has not received Covid Vaccines  Terrial Rhodes, LCSWA

## 2020-09-06 NOTE — Consult Note (Signed)
[facility]       Reason for Consult: cellulitis with Strep bacteremia    Referring Physician: Dr. Pola Cornahal  Principal Problem:   Severe sepsis with acute organ dysfunction due to Streptococcus species [facility](HCC) Active Problems:   Persistent atrial fibrillation (HCC)   Hypertension   Cellulitis of left leg   Lactic acidosis   Elevated troponin   Hypoalbuminemia   Leukocytosis   Thrombocytopenia (HCC)   Hypokalemia   Hyperglycemia   Transaminitis   Total bilirubin, elevated   AKI (acute kidney injury) (HCC)   Nausea vomiting and diarrhea   CKD (chronic kidney disease), stage III (HCC)   Morbid obesity (HCC)   . aspirin EC  81 mg Oral Daily  . Chlorhexidine Gluconate Cloth  6 each Topical Q0600  . feeding supplement  237 mL Oral BID BM  . labetalol  200 mg Oral BID  . mouth rinse  15 mL Mouth Rinse BID  . sodium chloride flush  10-40 mL Intracatheter Q12H    Recommendations: Left leg elevation above the level of the heart Continue ceftriaxone while inpatient Amoxicillin 500 mg TID at discharge, likely will need 10 days at discharge  Assessment: He has erysipelas with left leg erythema with a well circumscribed area, raised, beta-hemolytic Strep organism in blood culture.  His WBC is improving, no further fever.   He is on appropriate antibiotics and main additional treatment he needs is leg elevation to reduce the swelling.  No muscle involvement on MRI, some fascial involvement only.  There is no indication for prolonged IV therapy and can go out on oral therapy once ready for discharge or transition to rehab.    Antibiotics: Ceftriaxone day 8 (minus one day of cefepime only)  HPI: Connor [name] is a 65 y.o. male obese with BMI of 34, aortic dissection, A fib who came in with N/VD.  Noted to have left leg cellulitis with fever and leukocytosis.  WBC has trended down with a high of 25.8 and now down to 14.9.  Some thrombocytosis with platelets  up to 538.  Blood culture positive for Group G Strep.  He does not feel like his leg has improved.  He has slough of the skin, swelling of the left lower extremity.  Still feels chills.     Review of Systems:  Constitutional: positive for chills or negative for fevers, malaise and anorexia Respiratory: negative for cough Gastrointestinal: positive for nausea, negative for diarrhea All other systems reviewed and are negative    Past Medical History:  Diagnosis Date  . AAA (abdominal aortic aneurysm) (HCC)   . History of open heart surgery   . Hypertension   . Seroma due to trauma [facility](HCC)     Social History   Tobacco Use  . Smoking status: Current Every Day Smoker    Packs/day: 0.50  . Smokeless tobacco: Never Used  Vaping Use  . Vaping Use: Never used  Substance Use Topics  . Alcohol use: Never  . Drug use: Never    Family History  Problem Relation Age of Onset  . CAD Mother   . CVA Father     No Known Allergies  Physical Exam: Constitutional: in no apparent distress  Vitals:   09/06/20 0743 09/06/20 1031  BP: (!) 147/81 (!) 160/95  Pulse: 92 87  Resp: 20   Temp: 97.9 F (36.6 C)   SpO2: 95%    EYES: anicteric Cardiovascular: Cor RRR Respiratory: clear; GI: obese  Musculoskeletal: left leg with 3+ pitting edema, + warmth, sloughing of anterior skin layer Skin: negatives: no rash Neuro: non-focal  Lab Results  Component Value Date   WBC 14.9 (H) 09/06/2020   HGB 7.5 (L) 09/06/2020   HCT 22.1 (L) 09/06/2020   MCV 86.7 09/06/2020   PLT 538 (H) 09/06/2020    Lab Results  Component Value Date   CREATININE 3.73 (H) 09/06/2020   BUN 67 (H) 09/06/2020   NA 144 09/06/2020   K 3.3 (L) 09/06/2020   CL 109 09/06/2020   CO2 23 09/06/2020    Lab Results  Component Value Date   ALT 45 (H) 09/04/2020   AST 75 (H) 09/04/2020   ALKPHOS 80 09/04/2020     Microbiology: Recent Results (from the past 240 hour(s))  Blood Culture (routine x 2)     Status:  Abnormal   Collection Time: 08/30/20  7:34 PM   Specimen: BLOOD  Result Value Ref Range Status   Specimen Description   Final    BLOOD BLOOD LEFT ARM Performed at [facility], [address] [place], [place] [address]    Special Requests   Final    BOTTLES DRAWN AEROBIC AND ANAEROBIC Blood Culture adequate volume Performed at [facility], [address], [place], [place] [address]    Culture  Setup Time   Final    GRAM POSITIVE COCCI IN CHAINS ANAEROBIC BOTTLE ONLY Gram Stain Report Called to,Read Back By and Verified With: MICHAEL DOSS @0905  08/31/20 BY JONES,T [facility] CORRECTED RESULTS PREVIOUSLY REPORTED AS: GRAM POSITIVE RODS CORRECTED RESULTS CALLED TO: S HURTH PHARMD @1409  08/31/20 EB Performed at [facility], [address] [place], [address] [place]    Culture STREPTOCOCCUS GROUP G (A)  Final   Report Status 09/02/2020 FINAL  Final   Organism ID, Bacteria STREPTOCOCCUS GROUP G  Final      Susceptibility   Streptococcus group g - MIC*    CLINDAMYCIN <=0.25 SENSITIVE Sensitive     AMPICILLIN <=0.25 SENSITIVE Sensitive     ERYTHROMYCIN <=0.12 SENSITIVE Sensitive     VANCOMYCIN <=0.12 SENSITIVE Sensitive     CEFTRIAXONE <=0.12 SENSITIVE Sensitive     LEVOFLOXACIN 0.5 SENSITIVE Sensitive     * STREPTOCOCCUS GROUP G  Blood Culture (routine x 2)     Status: Abnormal   Collection Time: 08/30/20  7:35 PM   Specimen: BLOOD RIGHT ARM  Result Value Ref Range Status   Specimen Description   Final    BLOOD RIGHT ARM Performed at [facility], [address] [place] [address] [place], [address] [place]    Special Requests   Final    BOTTLES DRAWN AEROBIC AND ANAEROBIC Blood Culture adequate volume Performed at [facility], [address], [place], [address] [place]    Culture  Setup Time   Final    GRAM POSITIVE COCCI IN CHAINS IN BOTH AEROBIC AND ANAEROBIC BOTTLES Gram Stain Report Called to,Read Back By and Verified With: MICHAEL DOSS @0905  08/31/20 BY JONES,T [facility] Organism ID to  follow CORRECTED RESULTS PREVIOUSLY REPORTED AS: GRAM POSITIVE RODS CORRECTED RESULTS CALLED TO: S HURTH PHARMD @1409  08/31/20 EB    Culture (A)  Final    STREPTOCOCCUS GROUP G SUSCEPTIBILITIES PERFORMED ON PREVIOUS CULTURE WITHIN THE LAST 5 DAYS. Performed at [facility], [address], [place], 10/29/20 [facility]    Report Status 09/02/2020 FINAL  Final  Resp Panel by RT-PCR (Flu A&B, Covid) Nasopharyngeal Swab     Status: None  Collection Time: 08/30/20  7:35 PM   Specimen: Nasopharyngeal Swab; Nasopharyngeal(NP) swabs in vial transport medium  Result Value Ref Range Status   SARS Coronavirus 2 by RT PCR NEGATIVE NEGATIVE Final    Comment: (NOTE) SARS-CoV-2 target nucleic acids are NOT DETECTED.  The SARS-CoV-2 RNA is generally detectable in upper respiratory specimens during the acute phase of infection. The lowest concentration of SARS-CoV-2 viral copies this assay can detect is 138 copies/mL. A negative result does not preclude SARS-Cov-2 infection and should not be used as the sole basis for treatment or other patient management decisions. A negative result may occur with  improper specimen collection/handling, submission of specimen other than nasopharyngeal swab, presence of viral mutation(s) within the areas targeted by this assay, and inadequate number of viral copies(<138 copies/mL). A negative result must be combined with clinical observations, patient history, and epidemiological information. The expected result is Negative.  Fact Sheet for Patients:  BloggerCourse.com  Fact Sheet for Healthcare Providers:  SeriousBroker.it  This test is no t yet approved or cleared by the [place] FDA and  has been authorized for detection and/or diagnosis of SARS-CoV-2 by FDA under an Emergency Use Authorization (EUA). This EUA will remain  in effect (meaning this test can be used) for the duration of the COVID-19  declaration under Section 564(b)(1) of the Act, 21 U.S.C.section 360bbb-3(b)(1), unless the authorization is terminated  or revoked sooner.       Influenza A by PCR NEGATIVE NEGATIVE Final   Influenza B by PCR NEGATIVE NEGATIVE Final    Comment: (NOTE) The Xpert Xpress SARS-CoV-2/FLU/RSV plus assay is intended as an aid in the diagnosis of influenza from Nasopharyngeal swab specimens and should not be used as a sole basis for treatment. Nasal washings and aspirates are unacceptable for Xpert Xpress SARS-CoV-2/FLU/RSV testing.  Fact Sheet for Patients: BloggerCourse.com  Fact Sheet for Healthcare Providers: SeriousBroker.it  This test is not yet approved or cleared by the [place] FDA and has been authorized for detection and/or diagnosis of SARS-CoV-2 by FDA under an Emergency Use Authorization (EUA). This EUA will remain in effect (meaning this test can be used) for the duration of the COVID-19 declaration under Section 564(b)(1) of the Act, 21 U.S.C. section 360bbb-3(b)(1), unless the authorization is terminated or revoked.  Performed at [facility], [address], [place], [place] [address]   Blood Culture ID Panel (Reflexed)     Status: Abnormal   Collection Time: 08/30/20  7:35 PM  Result Value Ref Range Status   Enterococcus faecalis NOT DETECTED NOT DETECTED Final   Enterococcus Faecium NOT DETECTED NOT DETECTED Final   Listeria monocytogenes NOT DETECTED NOT DETECTED Final   Staphylococcus species NOT DETECTED NOT DETECTED Final   Staphylococcus aureus (BCID) NOT DETECTED NOT DETECTED Final   Staphylococcus epidermidis NOT DETECTED NOT DETECTED Final   Staphylococcus lugdunensis NOT DETECTED NOT DETECTED Final   Streptococcus species DETECTED (A) NOT DETECTED Final    Comment: Not Enterococcus species, Streptococcus agalactiae, Streptococcus pyogenes, or Streptococcus pneumoniae. CRITICAL RESULT CALLED TO, READ  BACK BY AND VERIFIED WITH: G. Coffee PharmD 16:30 08/31/20 ([email or web address])    Streptococcus agalactiae NOT DETECTED NOT DETECTED Final   Streptococcus pneumoniae NOT DETECTED NOT DETECTED Final   Streptococcus pyogenes NOT DETECTED NOT DETECTED Final   A.calcoaceticus-baumannii NOT DETECTED NOT DETECTED Final   Bacteroides fragilis NOT DETECTED NOT DETECTED Final   Enterobacterales NOT DETECTED NOT DETECTED Final   Enterobacter cloacae complex NOT DETECTED NOT DETECTED Final  Escherichia coli NOT DETECTED NOT DETECTED Final   Klebsiella aerogenes NOT DETECTED NOT DETECTED Final   Klebsiella oxytoca NOT DETECTED NOT DETECTED Final   Klebsiella pneumoniae NOT DETECTED NOT DETECTED Final   Proteus species NOT DETECTED NOT DETECTED Final   Salmonella species NOT DETECTED NOT DETECTED Final   Serratia marcescens NOT DETECTED NOT DETECTED Final   Haemophilus influenzae NOT DETECTED NOT DETECTED Final   Neisseria meningitidis NOT DETECTED NOT DETECTED Final   Pseudomonas aeruginosa NOT DETECTED NOT DETECTED Final   Stenotrophomonas maltophilia NOT DETECTED NOT DETECTED Final   Candida albicans NOT DETECTED NOT DETECTED Final   Candida auris NOT DETECTED NOT DETECTED Final   Candida glabrata NOT DETECTED NOT DETECTED Final   Candida krusei NOT DETECTED NOT DETECTED Final   Candida parapsilosis NOT DETECTED NOT DETECTED Final   Candida tropicalis NOT DETECTED NOT DETECTED Final   Cryptococcus neoformans/gattii NOT DETECTED NOT DETECTED Final    Comment: Performed at [facility], [address] [place], [place] [address]  Urine culture     Status: Abnormal   Collection Time: 08/31/20  1:30 AM   Specimen: In/Out Cath Urine  Result Value Ref Range Status   Specimen Description   Final    IN/OUT CATH URINE Performed at [facility], [address], [place], [place] [address]    Special Requests   Final    NONE Performed at [facility], [address] [place], [place] [address]    Culture  1,000 COLONIES/mL ENTEROCOCCUS FAECALIS (A)  Final   Report Status 09/02/2020 FINAL  Final   Organism ID, Bacteria ENTEROCOCCUS FAECALIS (A)  Final      Susceptibility   Enterococcus faecalis - MIC*    AMPICILLIN <=2 SENSITIVE Sensitive     NITROFURANTOIN <=16 SENSITIVE Sensitive     VANCOMYCIN 1 SENSITIVE Sensitive     * 1,000 COLONIES/mL ENTEROCOCCUS FAECALIS  MRSA PCR Screening     Status: None   Collection Time: 08/31/20 11:36 AM   Specimen: Nasal Mucosa; Nasopharyngeal  Result Value Ref Range Status   MRSA by PCR NEGATIVE NEGATIVE Final    Comment:        The GeneXpert MRSA Assay (FDA approved for NASAL specimens only), is one component of a comprehensive MRSA colonization surveillance program. It is not intended to diagnose MRSA infection nor to guide or monitor treatment for MRSA infections. Performed at [facility], [address], [place], [place] [address]   Culture, blood (routine x 2)     Status: None (Preliminary result)   Collection Time: 09/03/20  5:38 PM   Specimen: BLOOD RIGHT HAND  Result Value Ref Range Status   Specimen Description BLOOD RIGHT HAND  Final   Special Requests   Final    AEROBIC BOTTLE ONLY Blood Culture results may not be optimal due to an inadequate volume of blood received in culture bottles   Culture   Final    NO GROWTH 3 DAYS Performed at [facility], [address], [place], [place] [address]    Report Status PENDING  Incomplete  Culture, blood (routine x 2)     Status: None (Preliminary result)   Collection Time: 09/03/20  5:39 PM   Specimen: BLOOD RIGHT HAND  Result Value Ref Range Status   Specimen Description BLOOD RIGHT HAND  Final   Special Requests   Final    AEROBIC BOTTLE ONLY Blood Culture results may not be optimal due to an inadequate volume  of blood received in culture bottles   Culture   Final    NO GROWTH 3 DAYS Performed at [facility], [address] [place], [place] [address]    Report Status  PENDING  Incomplete    Connor Barefootobert W Taysha Majewski, MD [facility] [email or web address] 09/06/2020, 11:24 AM

## 2020-09-07 ENCOUNTER — Inpatient Hospital Stay (HOSPITAL_COMMUNITY): Payer: Medicaid Other | Admitting: Anesthesiology

## 2020-09-07 ENCOUNTER — Encounter (HOSPITAL_COMMUNITY)
Admission: EM | Disposition: A | Discharge status: Rehabilitation Facility | Payer: Self-pay | Source: Home / Self Care | Attending: Internal Medicine

## 2020-09-07 ENCOUNTER — Other Ambulatory Visit: Payer: Self-pay | Admitting: Physician Assistant

## 2020-09-07 DIAGNOSIS — B950 Streptococcus, group A, as the cause of diseases classified elsewhere: Secondary | ICD-10-CM

## 2020-09-07 DIAGNOSIS — B954 Other streptococcus as the cause of diseases classified elsewhere: Secondary | ICD-10-CM

## 2020-09-07 DIAGNOSIS — L03116 Cellulitis of left lower limb: Secondary | ICD-10-CM | POA: Diagnosis not present

## 2020-09-07 DIAGNOSIS — A409 Streptococcal sepsis, unspecified: Secondary | ICD-10-CM | POA: Diagnosis not present

## 2020-09-07 DIAGNOSIS — I4819 Other persistent atrial fibrillation: Secondary | ICD-10-CM | POA: Diagnosis not present

## 2020-09-07 DIAGNOSIS — R6520 Severe sepsis without septic shock: Secondary | ICD-10-CM | POA: Diagnosis not present

## 2020-09-07 DIAGNOSIS — M726 Necrotizing fasciitis: Secondary | ICD-10-CM | POA: Diagnosis not present

## 2020-09-07 HISTORY — PX: AMPUTATION: SHX166

## 2020-09-07 LAB — BASIC METABOLIC PANEL
Anion gap: 12 (ref 5–15)
BUN: 56 mg/dL — ABNORMAL HIGH (ref 8–23)
CO2: 23 mmol/L (ref 22–32)
Calcium: 7.8 mg/dL — ABNORMAL LOW (ref 8.9–10.3)
Chloride: 109 mmol/L (ref 98–111)
Creatinine, Ser: 2.84 mg/dL — ABNORMAL HIGH (ref 0.61–1.24)
GFR, Estimated: 24 mL/min — ABNORMAL LOW (ref >60)
Glucose, Bld: 97 mg/dL (ref 70–99)
Potassium: 3.5 mmol/L (ref 3.5–5.1)
Sodium: 144 mmol/L (ref 135–145)

## 2020-09-07 LAB — CBC
HCT: 23.8 % — ABNORMAL LOW (ref 39.0–52.0)
Hemoglobin: 7.7 g/dL — ABNORMAL LOW (ref 13.0–17.0)
MCH: 29.2 pg (ref 26.0–34.0)
MCHC: 32.4 g/dL (ref 30.0–36.0)
MCV: 90.2 fL (ref 80.0–100.0)
Platelets: 487 10*3/uL — ABNORMAL HIGH (ref 150–400)
RBC: 2.64 MIL/uL — ABNORMAL LOW (ref 4.22–5.81)
RDW: 16.4 % — ABNORMAL HIGH (ref 11.5–15.5)
WBC: 14.5 10*3/uL — ABNORMAL HIGH (ref 4.0–10.5)
nRBC: 0.4 % — ABNORMAL HIGH (ref 0.0–0.2)

## 2020-09-07 LAB — HEMOGLOBIN AND HEMATOCRIT, BLOOD
HCT: 25.1 % — ABNORMAL LOW (ref 39.0–52.0)
Hemoglobin: 8.5 g/dL — ABNORMAL LOW (ref 13.0–17.0)

## 2020-09-07 LAB — HEPATIC FUNCTION PANEL
ALT: 31 U/L (ref 0–44)
AST: 38 U/L (ref 15–41)
Albumin: 2 g/dL — ABNORMAL LOW (ref 3.5–5.0)
Alkaline Phosphatase: 73 U/L (ref 38–126)
Bilirubin, Direct: 0.3 mg/dL — ABNORMAL HIGH (ref 0.0–0.2)
Indirect Bilirubin: 0.4 mg/dL (ref 0.3–0.9)
Total Bilirubin: 0.7 mg/dL (ref 0.3–1.2)
Total Protein: 6.1 g/dL — ABNORMAL LOW (ref 6.5–8.1)

## 2020-09-07 LAB — TROPONIN I (HIGH SENSITIVITY): Troponin I (High Sensitivity): 33 ng/L — ABNORMAL HIGH (ref <18)

## 2020-09-07 LAB — PREPARE RBC (CROSSMATCH)

## 2020-09-07 LAB — CK: Total CK: 803 U/L — ABNORMAL HIGH (ref 49–397)

## 2020-09-07 LAB — HEPARIN LEVEL (UNFRACTIONATED): Heparin Unfractionated: 0.17 IU/mL — ABNORMAL LOW (ref 0.30–0.70)

## 2020-09-07 SURGERY — AMPUTATION, ABOVE KNEE
Anesthesia: General | Laterality: Left

## 2020-09-07 MED ORDER — METOCLOPRAMIDE HCL 5 MG/ML IJ SOLN: 5.0000 mg | Freq: Three times a day (TID) | INTRAMUSCULAR | Status: DC | PRN

## 2020-09-07 MED ORDER — MIDAZOLAM HCL 2 MG/2ML IJ SOLN
INTRAMUSCULAR | Status: AC
  Filled 2020-09-07: qty 2

## 2020-09-07 MED ORDER — ETOMIDATE 2 MG/ML IV SOLN
INTRAVENOUS | Status: AC
  Filled 2020-09-07: qty 10

## 2020-09-07 MED ORDER — ACETAMINOPHEN 500 MG PO TABS: 1000.0000 mg | ORAL_TABLET | Freq: Once | ORAL | Status: DC

## 2020-09-07 MED ORDER — HYDROMORPHONE HCL 1 MG/ML IJ SOLN
1.0000 mg | INTRAMUSCULAR | Status: DC | PRN
  Administered 2020-09-07 – 2020-09-08 (×4): 1 mg via INTRAVENOUS
  Filled 2020-09-07 (×4): qty 1

## 2020-09-07 MED ORDER — SUCCINYLCHOLINE CHLORIDE 200 MG/10ML IV SOSY
PREFILLED_SYRINGE | INTRAVENOUS | Status: AC
  Filled 2020-09-07: qty 10

## 2020-09-07 MED ORDER — LACTATED RINGERS IV SOLN: INTRAVENOUS | Status: DC | PRN

## 2020-09-07 MED ORDER — PROMETHAZINE HCL 25 MG/ML IJ SOLN: 6.2500 mg | INTRAMUSCULAR | Status: DC | PRN

## 2020-09-07 MED ORDER — ONDANSETRON HCL 4 MG/2ML IJ SOLN: 4.0000 mg | Freq: Four times a day (QID) | INTRAMUSCULAR | Status: DC | PRN

## 2020-09-07 MED ORDER — SODIUM CHLORIDE 0.9% IV SOLUTION: Freq: Once | INTRAVENOUS | Status: DC

## 2020-09-07 MED ORDER — FENTANYL CITRATE (PF) 100 MCG/2ML IJ SOLN
INTRAMUSCULAR | Status: AC
  Filled 2020-09-07: qty 2

## 2020-09-07 MED ORDER — DEXTROSE 5 % IV SOLN: 3.0000 g | INTRAVENOUS | Status: DC

## 2020-09-07 MED ORDER — LABETALOL HCL 5 MG/ML IV SOLN
INTRAVENOUS | Status: DC | PRN
  Administered 2020-09-07: 5 mg via INTRAVENOUS

## 2020-09-07 MED ORDER — FENTANYL CITRATE (PF) 100 MCG/2ML IJ SOLN
25.0000 ug | INTRAMUSCULAR | Status: DC | PRN
  Administered 2020-09-07: 50 ug via INTRAVENOUS

## 2020-09-07 MED ORDER — MIDAZOLAM HCL 5 MG/5ML IJ SOLN
INTRAMUSCULAR | Status: DC | PRN
  Administered 2020-09-07 (×2): .5 mg via INTRAVENOUS

## 2020-09-07 MED ORDER — LABETALOL HCL 5 MG/ML IV SOLN
INTRAVENOUS | Status: AC
  Filled 2020-09-07: qty 4

## 2020-09-07 MED ORDER — ETOMIDATE 2 MG/ML IV SOLN
INTRAVENOUS | Status: DC | PRN
  Administered 2020-09-07: 20 mg via INTRAVENOUS

## 2020-09-07 MED ORDER — LIDOCAINE 2% (20 MG/ML) 5 ML SYRINGE
INTRAMUSCULAR | Status: DC | PRN
  Administered 2020-09-07: 100 mg via INTRAVENOUS

## 2020-09-07 MED ORDER — ESMOLOL HCL 100 MG/10ML IV SOLN
INTRAVENOUS | Status: DC | PRN
  Administered 2020-09-07: 10 mg via INTRAVENOUS

## 2020-09-07 MED ORDER — ONDANSETRON HCL 4 MG PO TABS: 4.0000 mg | ORAL_TABLET | Freq: Four times a day (QID) | ORAL | Status: DC | PRN

## 2020-09-07 MED ORDER — METOCLOPRAMIDE HCL 5 MG PO TABS: 5.0000 mg | ORAL_TABLET | Freq: Three times a day (TID) | ORAL | Status: DC | PRN

## 2020-09-07 MED ORDER — SUCCINYLCHOLINE CHLORIDE 200 MG/10ML IV SOSY
PREFILLED_SYRINGE | INTRAVENOUS | Status: DC | PRN
  Administered 2020-09-07: 160 mg via INTRAVENOUS

## 2020-09-07 MED ORDER — 0.9 % SODIUM CHLORIDE (POUR BTL) OPTIME
TOPICAL | Status: DC | PRN
  Administered 2020-09-07: 1000 mL

## 2020-09-07 MED ORDER — LIDOCAINE 2% (20 MG/ML) 5 ML SYRINGE
INTRAMUSCULAR | Status: AC
  Filled 2020-09-07: qty 5

## 2020-09-07 MED ORDER — HYDRALAZINE HCL 20 MG/ML IJ SOLN
10.0000 mg | Freq: Four times a day (QID) | INTRAMUSCULAR | Status: DC | PRN
  Administered 2020-09-07 – 2020-09-09 (×3): 10 mg via INTRAVENOUS
  Filled 2020-09-07 (×4): qty 1

## 2020-09-07 MED ORDER — PROPOFOL 10 MG/ML IV BOLUS
INTRAVENOUS | Status: AC
  Filled 2020-09-07: qty 40

## 2020-09-07 MED ORDER — SODIUM CHLORIDE 0.9 % IV SOLN: INTRAVENOUS | Status: DC

## 2020-09-07 MED ORDER — FENTANYL CITRATE (PF) 250 MCG/5ML IJ SOLN
INTRAMUSCULAR | Status: DC | PRN
  Administered 2020-09-07: 100 ug via INTRAVENOUS
  Administered 2020-09-07 (×2): 25 ug via INTRAVENOUS
  Administered 2020-09-07: 50 ug via INTRAVENOUS

## 2020-09-07 MED ORDER — CEFAZOLIN SODIUM 1 G IJ SOLR
INTRAMUSCULAR | Status: AC
  Filled 2020-09-07: qty 20

## 2020-09-07 MED ORDER — ESMOLOL HCL 100 MG/10ML IV SOLN
INTRAVENOUS | Status: AC
  Filled 2020-09-07: qty 10

## 2020-09-07 MED ORDER — FENTANYL CITRATE (PF) 250 MCG/5ML IJ SOLN
INTRAMUSCULAR | Status: AC
  Filled 2020-09-07: qty 5

## 2020-09-07 MED ORDER — ROCURONIUM BROMIDE 10 MG/ML (PF) SYRINGE
PREFILLED_SYRINGE | INTRAVENOUS | Status: AC
  Filled 2020-09-07: qty 10

## 2020-09-07 MED ORDER — NIFEDIPINE ER OSMOTIC RELEASE 30 MG PO TB24
30.0000 mg | ORAL_TABLET | Freq: Every day | ORAL | Status: DC
  Administered 2020-09-07: 30 mg via ORAL
  Filled 2020-09-07 (×3): qty 1

## 2020-09-07 MED ORDER — HYDROMORPHONE HCL 1 MG/ML IJ SOLN
0.5000 mg | Freq: Once | INTRAMUSCULAR | Status: AC
  Administered 2020-09-07: 0.5 mg via INTRAVENOUS
  Filled 2020-09-07: qty 1

## 2020-09-07 SURGICAL SUPPLY — 36 items
BLADE SAW RECIP 87.9 MT (BLADE) ×1 IMPLANT
BLADE SURG 21 STRL SS (BLADE) ×2 IMPLANT
BNDG GAUZE ELAST 4 BULKY (GAUZE/BANDAGES/DRESSINGS) ×4 IMPLANT
COVER SURGICAL LIGHT HANDLE (MISCELLANEOUS) ×4 IMPLANT
DRAPE DERMATAC (DRAPES) ×3 IMPLANT
DRAPE U-SHAPE 47X51 STRL (DRAPES) ×2 IMPLANT
DRESSING PREVENA PLUS CUSTOM (GAUZE/BANDAGES/DRESSINGS) IMPLANT
DRESSING VERAFLO CLEANSE CC (GAUZE/BANDAGES/DRESSINGS) IMPLANT
DRSG ADAPTIC 3X8 NADH LF (GAUZE/BANDAGES/DRESSINGS) ×2 IMPLANT
DRSG PREVENA PLUS CUSTOM (GAUZE/BANDAGES/DRESSINGS) ×2
DRSG VERAFLO CLEANSE CC (GAUZE/BANDAGES/DRESSINGS) ×2
DURAPREP 26ML APPLICATOR (WOUND CARE) ×2 IMPLANT
ELECT REM PT RETURN 9FT ADLT (ELECTROSURGICAL) ×2
ELECTRODE REM PT RTRN 9FT ADLT (ELECTROSURGICAL) IMPLANT
GAUZE SPONGE 4X4 12PLY STRL (GAUZE/BANDAGES/DRESSINGS) ×2 IMPLANT
GLOVE BIOGEL PI IND STRL 9 (GLOVE) ×1 IMPLANT
GLOVE BIOGEL PI INDICATOR 9 (GLOVE) ×1
GLOVE SURG ORTHO 9.0 STRL STRW (GLOVE) ×2 IMPLANT
GOWN STRL REUS W/ TWL XL LVL3 (GOWN DISPOSABLE) ×2 IMPLANT
GOWN STRL REUS W/TWL XL LVL3 (GOWN DISPOSABLE) ×4
HANDPIECE INTERPULSE COAX TIP (DISPOSABLE) ×2
KIT BASIN OR (CUSTOM PROCEDURE TRAY) ×2 IMPLANT
KIT TURNOVER KIT B (KITS) ×2 IMPLANT
MANIFOLD NEPTUNE II (INSTRUMENTS) ×2 IMPLANT
NS IRRIG 1000ML POUR BTL (IV SOLUTION) ×2 IMPLANT
PACK ORTHO EXTREMITY (CUSTOM PROCEDURE TRAY) ×2 IMPLANT
PAD ARMBOARD 7.5X6 YLW CONV (MISCELLANEOUS) ×4 IMPLANT
SET HNDPC FAN SPRY TIP SCT (DISPOSABLE) IMPLANT
STOCKINETTE IMPERVIOUS 9X36 MD (GAUZE/BANDAGES/DRESSINGS) ×1 IMPLANT
SUT ETHILON 2 0 PSLX (SUTURE) ×4 IMPLANT
SUT SILK 2 0 (SUTURE) ×2
SUT SILK 2-0 18XBRD TIE 12 (SUTURE) IMPLANT
SWAB COLLECTION DEVICE MRSA (MISCELLANEOUS) ×2 IMPLANT
TOWEL GREEN STERILE (TOWEL DISPOSABLE) ×2 IMPLANT
TUBE CONNECTING 12X1/4 (SUCTIONS) ×2 IMPLANT
YANKAUER SUCT BULB TIP NO VENT (SUCTIONS) ×2 IMPLANT

## 2020-09-07 NOTE — Transfer of Care (Signed)
Immediate Anesthesia Transfer of Care Note  Patient: Connor [name]  Procedure(s) Performed: LEFT LEG DEBRIDEMENT FASCIOTOMIES, APPLY INSTILLATION WOUND VAC, ABOVE KNEE AMPUTATION (Left )  Patient Location: PACU  Anesthesia Type:General  Level of Consciousness: drowsy and patient cooperative  Airway & Oxygen Therapy: Patient Spontanous Breathing and Patient connected to face mask oxygen  Post-op Assessment: Report given to RN, Post -op Vital signs reviewed and stable and Patient moving all extremities  Post vital signs: Reviewed and stable  Last Vitals:  Vitals Value Taken Time  BP 180/118 09/07/20 0953  Temp    Pulse 90 09/07/20 0958  Resp 19 09/07/20 0958  SpO2 96 % 09/07/20 0958  Vitals shown include unvalidated device data.  Last Pain:  Vitals:   09/07/20 0700  TempSrc: Oral  PainSc:       Patients Stated Pain Goal: 0 (09/07/20 0345)  Complications: No complications documented.

## 2020-09-07 NOTE — Interval H&P Note (Signed)
History and Physical Interval Note:  09/07/2020 6:56 AM  Connor [name]  has presented today for surgery, with the diagnosis of Left Leg Necrotizing Fasciitis.  The various methods of treatment have been discussed with the patient and family. After consideration of risks, benefits and other options for treatment, the patient has consented to  Procedure(s): LEFT LEG DEBRIDEMENT FASCIOTOMIES, APPLY INSTILLATION WOUND VAC, POSSIBLE AMPUTATION (Left) as a surgical intervention.  The patient's history has been reviewed, patient examined, no change in status, stable for surgery.  I have reviewed the patient's chart and labs.  Questions were answered to the patient's satisfaction.     Nadara Mustard

## 2020-09-07 NOTE — Anesthesia Procedure Notes (Signed)
Procedure Name: Intubation Date/Time: 09/07/2020 8:46 AM Performed by: Lytle Michaels, CRNA Pre-anesthesia Checklist: Patient identified, Emergency Drugs available, Suction available, Patient being monitored and Timeout performed Patient Re-evaluated:Patient Re-evaluated prior to induction Oxygen Delivery Method: Circle system utilized Preoxygenation: Pre-oxygenation with 100% oxygen Induction Type: IV induction Ventilation: Mask ventilation without difficulty Laryngoscope Size: Miller and 2 Grade View: Grade I Tube type: Oral Tube size: 7.5 mm Number of attempts: 1 Airway Equipment and Method: Stylet and Oral airway Placement Confirmation: ETT inserted through vocal cords under direct vision,  positive ETCO2 and breath sounds checked- equal and bilateral Secured at: 22 cm Tube secured with: Tape Dental Injury: Teeth and Oropharynx as per pre-operative assessment

## 2020-09-07 NOTE — TOC Progression Note (Signed)
Transition of Care [facility]) - Progression Note    Patient Details  Name: Connor [name] MRN: [id number] Date of Birth: 08-27-1956  [facility]) CM/SW Contact  Terrial Rhodes, LCSWA Phone Number: 09/07/2020, 2:23 PM  Clinical Narrative:     CSW spoke with patient at bedside. CSW let patient know that there are currently no SNF bed offers. CSW discussed insurance process to patient. CSW asked patient if he would like for her to fax out further. Patient has decided to decline SNF placement at this time. Patient comes from home with cousin. Patient confirmed that he will have supervision from his cousin at home. CSW informed casemanager.  CSW will continue to follow.    Expected Discharge Plan: Skilled Nursing Facility Barriers to Discharge: Continued Medical Work up  Expected Discharge Plan and Services Expected Discharge Plan: Skilled Nursing Facility In-house Referral: Clinical Social Work     Living arrangements for the past 2 months: Single Family Home                                       Social Determinants of Health (SDOH) Interventions    Readmission Risk Interventions No flowsheet data found.

## 2020-09-07 NOTE — Progress Notes (Signed)
ANTICOAGULATION CONSULT NOTE - Follow-Up Consult  Pharmacy Consult for IV Heparin Indication: atrial fibrillation  No Known Allergies  Patient Measurements: Height: 5\' 11"  (180.3 cm) Weight: 120.8 kg (266 lb 5.1 oz) IBW/kg (Calculated) : 75.3 Heparin Dosing Weight: 100 kg  Vital Signs: Temp: 97.8 F (36.6 C) (01/12 1658) Temp Source: Axillary (01/12 1658) BP: 164/94 (01/12 1658) Pulse Rate: 81 (01/12 1658)  Labs: Recent Labs    09/05/20 0420 09/06/20 0441 09/06/20 2107 09/06/20 2147 09/07/20 0019 09/07/20 1119 09/07/20 1744  HGB 6.8* 7.5* 8.3*  --  7.7* 8.5*  --   HCT 19.0* 22.1* 24.0*  --  23.8* 25.1*  --   PLT 530* 538*  --   --  487*  --   --   APTT 154*  --   --   --   --   --   --   HEPARINUNFRC 0.69 0.11* 0.12*  --   --   --  0.17*  CREATININE 4.96* 3.73*  --   --  2.84*  --   --   CKTOTAL 2,084* 1,066*  --   --  803*  --   --   TROPONINIHS  --   --   --  32* 33*  --   --     Estimated Creatinine Clearance: 35.2 mL/min (A) (by C-G formula based on SCr of 2.84 mg/dL (H)).   Medical History: Past Medical History:  Diagnosis Date  . AAA (abdominal aortic aneurysm) (HCC)   . History of open heart surgery   . Hypertension   . Seroma due to trauma [facility])     Assessment: 65 yr old male with hx Afib on apixaban 5 mg bid PTA  was admitted with bacteremia. Apixaban was on hold for possible procedures during acute illness and pt was started on IV heparin. IV heparin was briefly stopped on 1/10, with plans to transition back to apixaban, but IV heparin was resumed, as patient will need orthopedic intervention. Pt is S/P L leg debridement, fasciotomies today. Per RN, heparin was stopped for surgery and resumed at 1550 units/hr at ~1100 AM today.  Heparin level ~6 hrs after heparin was resumed post op at 1550 units/hr was 0.17 units/ml, which is below the goal range for this pt. H/H 8.5/25.1, plt 487. Per RN, no issues with IV or bleeding observed.  Goal of Therapy:   Heparin level 0.3-0.7 units/ml Monitor platelets by anticoagulation protocol: Yes   Plan:  Increase heparin infusion to 1750 units/hr Check heparin level in ~7 hrs Monitor daily HL, CBC, s/sx bleeding  04-18-1996, PharmD, BCPS, [facility] Clinical Pharmacist 09/07/2020 6:49 PM

## 2020-09-07 NOTE — Progress Notes (Signed)
   09/07/20 0800  OT Visit Information  Last OT Received On 09/07/20  Reason Eval/Treat Not Completed Patient at procedure or test/ unavailable (OR)  History of Present Illness The pt is a 65 yo male presenting originally for nausea/vomiting, fever, and LLE pain and swelling. Pt found to have sepsis and rhabdomyolysis stemming from LLE cellulitis/myofascitis. PMH includes: AAA, CABG, and HTN.   Plan to reattempt at a later date/time.  Raynald Kemp, OT Acute Rehabilitation Services Pager: [phone] Office: [phone]

## 2020-09-07 NOTE — Anesthesia Postprocedure Evaluation (Signed)
Anesthesia Post Note  Patient: Connor [name]  Procedure(s) Performed: ATTEMPTED LEFT LEG DEBRIDEMENT FASCIOTOMIES, APPLY INSTILLATION WOUND VAC, ABOVE KNEE AMPUTATION (Left )     Patient location during evaluation: PACU Anesthesia Type: General Level of consciousness: sedated Pain management: pain level controlled Vital Signs Assessment: post-procedure vital signs reviewed and stable Respiratory status: spontaneous breathing and respiratory function stable Cardiovascular status: stable Postop Assessment: no apparent nausea or vomiting Anesthetic complications: no   No complications documented.  Last Vitals:  Vitals:   09/07/20 1050 09/07/20 1058  BP: (!) 181/101 (!) 166/103  Pulse:    Resp:    Temp:    SpO2:                    Jaquille Kau DANIEL

## 2020-09-07 NOTE — Progress Notes (Signed)
Progress Note  Patient Name: Connor [name] Date of Encounter: 09/07/2020  [facility] Cardiologist: Little Ishikawa, MD   Subjective   Lethargic from anesthesia following surgery; no dyspnea or CP  Inpatient Medications    Scheduled Meds: . [MAR Hold] sodium chloride   Intravenous Once  . acetaminophen  1,000 mg Oral Once  . [MAR Hold] aspirin EC  81 mg Oral Daily  . [MAR Hold] Chlorhexidine Gluconate Cloth  6 each Topical Q0600  . [MAR Hold] feeding supplement  237 mL Oral BID BM  . fentaNYL      . [MAR Hold] labetalol  200 mg Oral BID  . [MAR Hold] mouth rinse  15 mL Mouth Rinse BID  . [MAR Hold] sodium chloride flush  10-40 mL Intracatheter Q12H   Continuous Infusions: .  ceFAZolin (ANCEF) IV    . [MAR Hold] cefTRIAXone (ROCEPHIN)  IV 2 g (09/06/20 2158)  . heparin Stopped (09/07/20 0834)   PRN Meds: fentaNYL (SUBLIMAZE) injection, [MAR Hold]  HYDROmorphone (DILAUDID) injection, [MAR Hold] ondansetron (ZOFRAN) IV, [MAR Hold] oxyCODONE, promethazine, [MAR Hold] sodium chloride flush   Vital Signs    Vitals:   09/07/20 0700 09/07/20 0945 09/07/20 1000 09/07/20 1015  BP: (!) 166/78 (!) 164/103 (!) 177/115   Pulse: 84 (!) 103 86 84  Resp: 17 (!) 21 18 18   Temp: 98 F (36.7 C) 98.5 F (36.9 C)    TempSrc: Oral     SpO2: 98% 97% 95% 96%  Weight:      Height:        Intake/Output Summary (Last 24 hours) at 09/07/2020 1027 Last data filed at 09/07/2020 0935 Gross per 24 hour  Intake 1778.35 ml  Output 2350 ml  Net -571.65 ml   Last 3 Weights 09/07/2020 09/03/2020 09/03/2020  Weight (lbs) 266 lb 5.1 oz 247 lb 12.8 oz 260 lb 5.8 oz  Weight (kg) 120.8 kg 112.4 kg 118.1 kg      Telemetry    Atrial fibrillation rate controlled - Personally Reviewed  Physical Exam   GEN: WD NAD Neck: supple Cardiac: irregular, no gallop Respiratory: CTA anteriorly GI: Soft, NT/ND, no masses MS: s/p left AKA Neuro:  Lethargic; no focal findings  Labs    High  Sensitivity Troponin:   Recent Labs  Lab 08/30/20 1926 08/30/20 2215 09/06/20 2147 09/07/20 0019  TROPONINIHS 425* 482* 32* 33*      Chemistry Recent Labs  Lab 09/02/20 0416 09/03/20 0419 09/04/20 1446 09/05/20 0420 09/06/20 0441 09/07/20 0019  NA 138   < > 142 144 144 144  K 3.4*   < > 3.0* 3.1* 3.3* 3.5  CL 103   < > 107 110 109 109  CO2 18*   < > 21* 23 23 23   GLUCOSE 106*   < > 124* 114* 99 97  BUN 91*   < > 87* 79* 67* 56*  CREATININE 7.61*   < > 5.79* 4.96* 3.73* 2.84*  CALCIUM 7.0*   < > 7.6* 7.7* 7.8* 7.8*  PROT 6.4*  --  5.7*  --   --  6.1*  ALBUMIN 2.3*  --  1.9*  --   --  2.0*  AST 173*  --  75*  --   --  38  ALT 54*  --  45*  --   --  31  ALKPHOS 84  --  80  --   --  73  BILITOT 2.3*  --  1.3*  --   --  0.7  GFRNONAA 7*   < > 10* 12* 17* 24*  ANIONGAP 17*   < > 14 11 12 12    < > = values in this interval not displayed.     Hematology Recent Labs  Lab 09/05/20 0420 09/06/20 0441 09/06/20 2107 09/07/20 0019  WBC 18.9* 14.9*  --  14.5*  RBC 2.29* 2.55*  2.56*  --  2.64*  HGB 6.8* 7.5* 8.3* 7.7*  HCT 19.0* 22.1* 24.0* 23.8*  MCV 83.0 86.7  --  90.2  MCH 29.7 29.4  --  29.2  MCHC 35.8 33.9  --  32.4  RDW 15.2 16.0*  --  16.4*  PLT 530* 538*  --  487*    Patient Profile     65 y.o. male with past medical history aortic dissection repair, previous cardiomyopathy improved, persistent atrial fibrillation, hypertension admitted with diarrhea, leg pain with fasciitis, fever.  Found to be bacteremic and also in atrial fibrillation with elevated rate.  Cardiology asked to evaluate.  Echocardiogram shows ejection fraction 60 to 65%, severe left ventricular hypertrophy, grade 2 diastolic dysfunction, biatrial enlargement.  Assessment & Plan    1 bacteremia-previous TEE showed no vegetation. Continue antibiotics for cellulitis per ID.  2 persistent atrial fibrillation-patient's heart rate remains controlled.  Continue labetalol.  He is essentially  asymptomatic and rate control and anticoagulation will likely be best option long-term.  Continue heparin.  Can transition back to apixaban once all procedures complete.    3 acute kidney injury-felt secondary to rhabdomyolysis.  Follow renal function; nephrology following.  4 left leg cellulitis/fasciitis-now s/p left AKA; antibiotics per primary care.  5 history of aortic arch dissection repair-needs good blood pressure control.  6 hypertension-blood pressure mildly elevated; will continue labetalol; resume procardia 30 mg daily and follow.  For questions or updates, please contact [facility] Please consult [email or web address] for contact info under     Signed, 65, MD  09/07/2020, 10:27 AM

## 2020-09-07 NOTE — Progress Notes (Addendum)
PROGRESS NOTE  Connor [name]  DOB: 04-11-1956  PCP: Patient, No Pcp Per ZOX:[id number]  DOA: 08/30/2020  LOS: 8 days   Chief Complaint  Patient presents with  . Fever    Brief narrative: Connor SequinRobert Schellinger is a 65 y.o. male with PMH significant for HTN, CKD 3, A. 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  Patient presented to the ED on 1/4 with 1 week history of nausea, vomiting, chills followed by worsening left leg pain and swelling.  In the ED, he was noted to be in severe sepsis with WBC count elevated to 15, lactic acid 2, creatinine 4.2 with a baseline of 1.3, potassium low at 2.8.  CK level was elevated more than 28,000. Ultrasound rule out DVT.   CT scan of left leg showed soft tissue swelling but no muscle damage and no signs of abscess although limited due to lack of contrast and CT due to renal function.  MRI noted signs of myofascitis.   Patient was admitted to hospitalist service for cellulitis, rhabdomyolysis, AKI. Orthopedic and nephrology consultation were obtained. Blood cultures sent on admission grew Streptococcus group G, cellulitis likely being the source.    Over the course of hospitalization, patient had improvement in parameters for sepsis, rhabdomyolysis, AKI.  However his left leg continued to remain swollen and tender. Orthopedic consultation was obtained with Dr. Lajoyce Cornersuda. 1/12, patient underwent left lower extremity AKA for necrotizing fasciitis  Subjective: Patient was seen and examined this morning.  Patient underwent left lower extremity AKA this morning.  He was under the influence of pain medicine.  Assessment/Plan: Necrotizing fasciitis of left leg -Initially presented as left leg cellulitis.  Also had evidence of myositis on MRI  and rhabdomyolysis. -Sepsis parameters improved with antibiotics but left leg continues to remain swollen and tender. -Orthopedic consultation was obtained with Dr. Lajoyce Cornersuda. 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  Bacteremia with Streptococcus group G -Left leg cellulitis/myositis probably the source.   -Currently on IV Rocephin.  Repeat blood culture from 1/8 did not show any growth.   -TEE 1/10 did not show any evidence of vegetation.  -ID consult appreciated.  Severe sepsis - POA -Secondary to cellulitis.  Sepsis resolved.  Parameters improving as below. Recent Labs  Lab 09/01/20 1026 09/01/20 1538 09/02/20 0416 09/03/20 0419 09/04/20 1446 09/05/20 0420 09/06/20 0441 09/07/20 0019  WBC  --    < > 22.8* 25.8* 20.4* 18.9* 14.9* 14.5*  LATICACIDVEN 0.9  --   --   --   --   --   --   --   PROCALCITON 43.41  --  34.97 20.62 7.47 5.24 2.99  --    < > = values in this interval not displayed.   Acute kidney injury -Baseline creatinine less than 1.4.  Patient with elevated creatinine of 4.2, peaked at 7.6 on 1/7.  AKI to be due to acute tubular injury and decreased renal perfusion in the setting of rhabdomyolysis. Creatinine improving gradually.  Discussed with nephrology this morning.  Patient has been adequately hydrated.  We will stop IV fluid today. -Continue to monitor. Nephrology following. Recent Labs    08/30/20 1947 08/31/20 0414 08/31/20 1912 09/01/20 0532 09/01/20 1538 09/02/20  2563 09/03/20 0419 09/04/20 1446 09/05/20 0420 09/06/20 0441 09/07/20 0019  BUN 55* 54*  --  78* 84* 91* 99* 87* 79* 67* 56*  CREATININE 4.21* 4.56* 5.77* 6.51* 7.17* 7.61* 7.46* 5.79* 4.96* 3.73* 2.84*   Rhabdomyolysis  Elevated transaminases -Secondary to left leg myofascitis -CK level and transaminases level are trending down as below with IV fluid. -Resume IV normal saline at 175ml/hr for next 24  hours. Recent Labs  Lab 08/31/20 1418 09/01/20 0532 09/02/20 0416 09/03/20 0419 09/04/20 1446 09/05/20 0420 09/06/20 0441 09/07/20 0019  CKTOTAL 89,373* 42,876* 81,157* 8,688* 3,244* 2,084* 1,066* 803*   Recent Labs  Lab 09/01/20 0532 09/01/20 1538 09/02/20 0416 09/04/20 1446 09/07/20 0019  AST 220* 193* 173* 75* 38  ALT 56* 54* 54* 45* 31  ALKPHOS 75 77 84 80 73  BILITOT 3.2* 2.8* 2.3* 1.3* 0.7  PROT 6.3* 6.1* 6.4* 5.7* 6.1*  ALBUMIN 2.5* 2.2* 2.3* 1.9* 2.0*   Acute anemia -Unclear cause.  No active bleeding but hemoglobin is running low. -2 units of PRBC this morning.  Total of 3 units of PRBCs this hospital stay. -Repeat hemoglobin tomorrow. Recent Labs    09/04/20 1446 09/05/20 0420 09/06/20 0441 09/06/20 2107 09/07/20 0019  HGB 7.1* 6.8* 7.5* 8.3* 7.7*  MCV 83.1 83.0 86.7  --  90.2  VITAMINB12  --   --  837  --   --   FOLATE  --   --  8.6  --   --   FERRITIN  --   --  1,090*  --   --   TIBC  --   --  202*  --   --   IRON  --   --  79  --   --   RETICCTPCT  --   --  3.2*  --   --    Essential hypertension Chronic diastolic dysfunction -Home meds include labetalol 200 mg twice daily, nifedipine 90 mg daily, Aldactone 25 mg daily, valsartan 80 mg daily -Meds initially held because of sepsis.  -Currently on labetalol 200 mg twice daily and nifedipine 90 mg daily.  Aldactone and valsartan are on hold.  Blood pressure mostly in normal range. -Echo from 1/6 with EF 60 to 65% and grade 2 diastolic dysfunction.  Persistent A. Fib -Patient has history of A. fib and DC cardioversion. -On admission, he was noted to be in A. fib and was started on amiodarone drip. Currently off amiodarone drip. On labetalol.  -Prior to admission, patient was on Eliquis for anticoagulation.  In the hospital, he has remained on heparin drip.  On hold this morning because of surgery.  Okay to resume when okay with orthopedics.    History of type aortic arch dissection  -He is status  post repair in 2016 and had dissection at anastomosis suture line seen December 2019.  Course was complicated by cardiogenic shock with EF of 15%.  Ultimately underwent total arch replacement in February 2020 while at [place].  -Continue aspirin and Eliquis. ?Not on a statin. -Lipid panel with HDL less than 10. Will discuss with cardiology about any specific statin/fibrate he will benefit from.  Chronic hypokalemia -Potassium level running low mostly between 3-3.5.  Replacement ordered. Recent Labs  Lab 09/03/20 0419 09/04/20 1446 09/05/20 0420 09/06/20 0441 09/07/20 0019  K 3.4* 3.0* 3.1* 3.3* 3.5   Hyperglycemia -No history of diabetes.  A1c 4.6 on 1/7. -Fingersticks consistently less than 150. Recent Labs  Lab 09/03/20 0745 09/04/20 2620  09/05/20 1509  GLUCAP 100* 104* 116*   Mobility: PT eval Code Status:   Code Status: Full Code  Nutritional status: Body mass index is 37.14 kg/m.     Diet Order            Diet Carb Modified Fluid consistency: Thin; Room service appropriate? Yes  Diet effective now                 DVT prophylaxis: SCDs Start: 09/07/20 1051 SCDs Start: 08/30/20 2133   Antimicrobials:  IV Rocephin Fluid: Normal saline at 125 mils per hour  Consultants: Nephrology, cardiology, ID, orthopedics Family Communication:  None at bedside  Status is: Inpatient  Remains inpatient appropriate because: Ongoing work-up for sepsis, AKI, cellulitis care Dispo: The patient is from: Home              Anticipated d/c is to: Home likely              Anticipated d/c date is: > 3 days              Patient currently is not medically stable to d/c.    Infusions:  . sodium chloride    . sodium chloride    . cefTRIAXone (ROCEPHIN)  IV 2 g (09/06/20 2158)  . heparin Stopped (09/07/20 16100834)    Scheduled Meds: . sodium chloride   Intravenous Once  . aspirin EC  81 mg Oral Daily  . Chlorhexidine Gluconate Cloth  6 each Topical Q0600  . feeding supplement   237 mL Oral BID BM  . fentaNYL      . labetalol  200 mg Oral BID  . mouth rinse  15 mL Mouth Rinse BID  . NIFEdipine  30 mg Oral Daily  . sodium chloride flush  10-40 mL Intracatheter Q12H    Antimicrobials: Anti-infectives (From admission, onward)   Start     Dose/Rate Route Frequency Ordered Stop   09/07/20 1015  ceFAZolin (ANCEF) 3 g in dextrose 5 % 50 mL IVPB  Status:  Discontinued        3 g 100 mL/hr over 30 Minutes Intravenous On call to O.R. 09/07/20 1009 09/07/20 1043   09/07/20 0600  ceFAZolin (ANCEF) IVPB 2g/100 mL premix        2 g 200 mL/hr over 30 Minutes Intravenous On call to O.R. 09/06/20 1651 09/07/20 0855   09/02/20 2200  cefTRIAXone (ROCEPHIN) 2 g in sodium chloride 0.9 % 100 mL IVPB        2 g 200 mL/hr over 30 Minutes Intravenous Every 24 hours 09/02/20 1044     09/01/20 2200  vancomycin (VANCOREADY) IVPB 1500 mg/300 mL  Status:  Discontinued        1,500 mg 150 mL/hr over 120 Minutes Intravenous Every 48 hours 08/31/20 0350 08/31/20 1719   09/01/20 0130  ceFEPIme (MAXIPIME) 2 g in sodium chloride 0.9 % 100 mL IVPB  Status:  Discontinued        2 g 200 mL/hr over 30 Minutes Intravenous Every 24 hours 08/31/20 1812 09/02/20 1043   09/01/20 0100  metroNIDAZOLE (FLAGYL) IVPB 500 mg  Status:  Discontinued        500 mg 100 mL/hr over 60 Minutes Intravenous Every 8 hours 08/31/20 1751 08/31/20 1926   08/31/20 2200  ceFEPIme (MAXIPIME) 2 g in sodium chloride 0.9 % 100 mL IVPB  Status:  Discontinued        2 g 200 mL/hr over 30 Minutes Intravenous Every  24 hours 08/31/20 0350 08/31/20 1719   08/31/20 2030  vancomycin (VANCOREADY) IVPB 1500 mg/300 mL        1,500 mg 150 mL/hr over 120 Minutes Intravenous  Once 08/31/20 2016 08/31/20 2338   08/31/20 2015  clindamycin (CLEOCIN) IVPB 900 mg  Status:  Discontinued        900 mg 100 mL/hr over 30 Minutes Intravenous Every 8 hours 08/31/20 1926 09/01/20 1033   08/31/20 1812  vancomycin variable dose per unstable renal  function (pharmacist dosing)  Status:  Discontinued         Does not apply See admin instructions 08/31/20 1812 09/01/20 1033   08/31/20 1730  cefTRIAXone (ROCEPHIN) 2 g in sodium chloride 0.9 % 100 mL IVPB  Status:  Discontinued        2 g 200 mL/hr over 30 Minutes Intravenous Every 24 hours 08/31/20 1720 08/31/20 1751   08/31/20 1015  metroNIDAZOLE (FLAGYL) IVPB 500 mg  Status:  Discontinued        500 mg 100 mL/hr over 60 Minutes Intravenous Every 8 hours 08/31/20 1012 08/31/20 1719   08/31/20 0045  ceFEPIme (MAXIPIME) 2 g in sodium chloride 0.9 % 100 mL IVPB        2 g 200 mL/hr over 30 Minutes Intravenous  Once 08/31/20 0030 08/31/20 0208   08/31/20 0000  vancomycin (VANCOREADY) IVPB 1500 mg/300 mL        1,500 mg 150 mL/hr over 120 Minutes Intravenous  Once 08/30/20 2351 08/31/20 0208   08/30/20 1900  cefTRIAXone (ROCEPHIN) 2 g in sodium chloride 0.9 % 100 mL IVPB  Status:  Discontinued        2 g 200 mL/hr over 30 Minutes Intravenous Every 24 hours 08/30/20 1855 08/31/20 0030      PRN meds: HYDROmorphone (DILAUDID) injection, metoCLOPramide **OR** metoCLOPramide (REGLAN) injection, ondansetron **OR** ondansetron (ZOFRAN) IV, oxyCODONE, sodium chloride flush   Objective: Vitals:   09/07/20 1050 09/07/20 1058  BP: (!) 181/101 (!) 166/103  Pulse:    Resp:    Temp:    SpO2:      Intake/Output Summary (Last 24 hours) at 09/07/2020 1133 Last data filed at 09/07/2020 1100 Gross per 24 hour  Intake 2528.35 ml  Output 2600 ml  Net -71.65 ml   Filed Weights   09/03/20 0403 09/03/20 1500 09/07/20 0556  Weight: 118.1 kg 112.4 kg 120.8 kg   Weight change:  Body mass index is 37.14 kg/m.   Physical Exam: General exam: Pleasant middle-aged African-American male.  Currently somnolent Skin: No rashes, lesions or ulcers. HEENT: Atraumatic, normocephalic, no obvious bleeding Lungs: Clear to auscultation bilaterally CVS: Regular rate and rhythm, no murmur GI/Abd soft,  nontender, nondistended, bowel sound present CNS: Somnolent postprocedure.  Opens eyes on verbal command. Psychiatry: Mood appropriate Extremities: Left AKA status.  Wound VAC attached to stump.  Data Review: I have personally reviewed the laboratory data and studies available.  Recent Labs  Lab 09/03/20 0419 09/04/20 1446 09/05/20 0420 09/06/20 0441 09/06/20 2107 09/07/20 0019  WBC 25.8* 20.4* 18.9* 14.9*  --  14.5*  HGB 8.2* 7.1* 6.8* 7.5* 8.3* 7.7*  HCT 23.7* 19.6* 19.0* 22.1* 24.0* 23.8*  MCV 86.2 83.1 83.0 86.7  --  90.2  PLT 406* 510* 530* 538*  --  487*   Recent Labs  Lab 09/03/20 0419 09/04/20 1446 09/05/20 0420 09/06/20 0441 09/07/20 0019  NA 138 142 144 144 144  K 3.4* 3.0* 3.1* 3.3* 3.5  CL 103 107  110 109 109  CO2 20* 21* 23 23 23   GLUCOSE 107* 124* 114* 99 97  BUN 99* 87* 79* 67* 56*  CREATININE 7.46* 5.79* 4.96* 3.73* 2.84*  CALCIUM 7.6* 7.6* 7.7* 7.8* 7.8*    F/u labs ordered  Signed, , MD [facility] 09/07/2020

## 2020-09-07 NOTE — Progress Notes (Signed)
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 CPK improving. Hopeful he will continue to have renal recovery -no indications for renal replacement therapy as of right now. Monitor for auto-diuresis -can stop mIVF, encourage po hydration -abdominal ultrasound without obstruction -Avoid nephrotoxic medications including NSAIDs and iodinated intravenous contrast exposure unless the latter is absolutely indicated.  Preferred narcotic agents for pain control are hydromorphone, fentanyl, and methadone. Morphine should not be used. Avoid Baclofen and avoid oral sodium phosphate and magnesium citrate based laxatives / bowel preps. Continue strict Input and Output monitoring. 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 Aldactone and valsartan held currently -currently BP up secondary to pain, stopping isotonic fluids today -keep MAP>65  Metabolic acidosis, anion gap. resolved Stable HCO3  Chronic hypokalemia. 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 Was supposed to see endocrinology for this. Replete PRN -adrenal glands unremarkable on pan-CTA 03/01/2020  Normocytic Anemia -Transfuse for Hgb<7 g/dL. Avoid iron in the setting of bacteremia/acute infection   Subjective:     S/p  left AKA, drowsy and slightly altered from surgery. No complaints.    Objective:   BP (!) 172/113 (BP Location: Left Arm)   Pulse 99   Temp 97.8 F (36.6 C) (Oral)   Resp 18   Ht 5\' 11"  (1.803 m)   Wt 120.8 kg   SpO2 98%   BMI 37.14 kg/m   Intake/Output Summary (Last 24 hours) at 09/07/2020 1618 Last data filed at 09/07/2020 1600 Gross per 24 hour  Intake 2913.35 ml  Output 2750 ml  Net 163.35 ml   Weight change:   Physical Exam: Gen:NAD CVS:reg rate Resp:cta bl, no w/r/r/c heard 11/05/2020, nt Ext: LLE aka, dressings and drains in place Neuro: speech clear and coherent, moves all ext spontaneously  Imaging: No results found.  Labs: BMET Recent Labs  Lab 09/01/20 1538 09/02/20 0416 09/03/20 0419 09/04/20 1446 09/05/20 0420 09/06/20 0441 09/07/20 0019  NA 137 138 138 142 144 144 144  K 3.3* 3.4* 3.4* 3.0* 3.1* 3.3* 3.5  CL 102 103 103 107 110 109 109  CO2 20* 18* 20* 21* 23 23 23   GLUCOSE 101* 106* 107* 124* 114* 99 97  BUN 84* 91* 99* 87* 79* 67* 56*  CREATININE 7.17* 7.61* 7.46* 5.79* 4.96* 3.73* 2.84*  CALCIUM 7.1* 7.0* 7.6* 7.6* 7.7* 7.8* 7.8*   CBC Recent Labs  Lab 09/04/20 1446 09/05/20 0420 09/06/20 0441 09/06/20 2107 09/07/20 0019 09/07/20 1119  WBC 20.4* 18.9* 14.9*  --  14.5*  --   HGB 7.1* 6.8* 7.5* 8.3* 7.7* 8.5*  HCT 19.6* 19.0* 22.1* 24.0* 23.8* 25.1*  MCV 83.1 83.0 86.7  --  90.2  --   PLT 510* 530* 538*  --  487*  --     Medications:    . sodium chloride   Intravenous Once  . aspirin EC  81 mg Oral Daily  . Chlorhexidine Gluconate Cloth  6 each Topical Q0600  . feeding supplement  237 mL Oral BID BM  . labetalol  200 mg Oral BID  . mouth rinse  15 mL Mouth Rinse BID  . NIFEdipine  30 mg Oral Daily  . sodium chloride flush  10-40 mL Intracatheter Q12H      [name], MD  [facility] 09/07/2020, 4:18 PM

## 2020-09-07 NOTE — Progress Notes (Signed)
PT Cancellation Note  Patient Details Name: Connor [name] MRN: [id number] DOB: 1956-03-14   Cancelled Treatment:    Reason Eval/Treat Not Completed: Patient at procedure or test/unavailable (OR).  Lillia Pauls, PT, DPT Acute Rehabilitation Services Pager [phone] Office [phone]    Norval Morton 09/07/2020, 8:04 AM

## 2020-09-07 NOTE — Op Note (Signed)
09/07/2020  10:01 AM  PATIENT:  Connor [name]    PRE-OPERATIVE DIAGNOSIS:  Left Leg Necrotizing Fasciitis  POST-OPERATIVE DIAGNOSIS:  Same  PROCEDURE:  LEFT LEG DEBRIDEMENT FASCIOTOMIES, APPLY INSTILLATION WOUND VAC, ABOVE KNEE AMPUTATION  SURGEON:  Nadara Mustard, MD  PHYSICIAN ASSISTANT:None ANESTHESIA:   General  PREOPERATIVE INDICATIONS:  Connor [name] is a  65 y.o. male with a diagnosis of Left Leg Necrotizing Fasciitis who failed conservative measures and elected for surgical management.    The risks benefits and alternatives were discussed with the patient preoperatively including but not limited to the risks of infection, bleeding, nerve injury, cardiopulmonary complications, the need for revision surgery, among others, and the patient was willing to proceed.  OPERATIVE IMPLANTS: Installation wound VAC sponges x3  1 unit packed red blood cells transfused during surgery  @ENCIMAGES @  OPERATIVE FINDINGS: Anterior lateral and posterior compartments of the left leg were dead there was no contractility in any of the muscle groups.  Also with electrocautery transection of the sciatic nerve there was no muscle contraction.  OPERATIVE PROCEDURE: Patient was brought to the operating room and underwent a general anesthetic.  After adequate levels anesthesia were obtained patient's left lower extremity was prepped using DuraPrep draped into a sterile field a timeout was called.  Medial and lateral extensile fasciotomies incisions were made over the leg.  This was carried down through the fascia.  The muscle was easily torn with digital retraction.  There was no muscle contractility with electrocautery of the muscle groups of the calf and there was poor color and clear weeping edema with necrotic fascia there is no purulence. The necrotic muscle was sent for cultures.  At this time is elected to proceed with a above-the-knee amputation.  A fishmouth incision was made with an extensile incision  for fasciotomies medially.  There again was clear fluid but the muscle had good color and contractility medially and laterally over the thigh as well as anteriorly over the quads.  A distal above-the-knee amputation was performed reciprocating saw was used to transect the bone and the medial vessels were suture-ligated with 2-0 silk.  Hemostasis was obtained the fascia and fluid from the thigh was sent for a second cultures.  The wound was then packed with 3 cleanse choice sponges the fasciotomy medially was closed over the sponge as well as the distal incision was closed over the spines with 2-0 nylon.  This was covered with derma tack and covered with Ioban to prevent the Dermatex from peeling up.  This had a good suction fit installation was set for 18 cc of saline 10 minutes of dwell time 2 hours of suction.  Patient received 1 unit of packed red blood cells during surgery.    Patient was extubated taken the PACU in stable condition      DISCHARGE PLANNING:  Antibiotic duration: Continue aggressive IV antibiotics  Weightbearing: Nonweightbearing on the left  Pain medication: Opioid pathway  Dressing care/ Wound VAC: Installation wound VAC  Ambulatory devices: Walker  Discharge to: Return to room with plan to return to the operating room on Friday for wound closure.  Tissue cultures obtained x2.  Follow-up: In the office 1 week post operative.

## 2020-09-07 NOTE — Progress Notes (Signed)
Notified MD Dahal of elevated BP 202/88 at resting state despite IV pain meds to r/o being pain related. All other VS WNL.   Order received for 10 MG IV Hydralazine, med given, BP 173/88 at this time, pt asleep, will continue to monitor.

## 2020-09-07 NOTE — Progress Notes (Signed)
    [facility]   Reason for visit: Follow up on necrotizing fasciitis  Interval History: s/p AKA, VAC in place.    Physical Exam: Constitutional:  Vitals:   09/07/20 1325 09/07/20 1338  BP: (!) 152/82 (!) 160/103  Pulse:  99  Resp: 18 18  Temp:    SpO2:  98%   patient appears in NAD  Impression: necrotizing fasciitis.  Culture with Group G Strep.  On ceftriaxone.   Plan: 1.  No changes, continue with antibiotics.

## 2020-09-08 ENCOUNTER — Encounter (HOSPITAL_COMMUNITY): Payer: Self-pay | Admitting: Orthopedic Surgery

## 2020-09-08 ENCOUNTER — Other Ambulatory Visit: Payer: Self-pay | Admitting: Physician Assistant

## 2020-09-08 DIAGNOSIS — B954 Other streptococcus as the cause of diseases classified elsewhere: Secondary | ICD-10-CM | POA: Diagnosis not present

## 2020-09-08 DIAGNOSIS — M6282 Rhabdomyolysis: Secondary | ICD-10-CM

## 2020-09-08 DIAGNOSIS — I4819 Other persistent atrial fibrillation: Secondary | ICD-10-CM | POA: Diagnosis not present

## 2020-09-08 DIAGNOSIS — R7881 Bacteremia: Secondary | ICD-10-CM | POA: Diagnosis not present

## 2020-09-08 DIAGNOSIS — R6520 Severe sepsis without septic shock: Secondary | ICD-10-CM | POA: Diagnosis not present

## 2020-09-08 DIAGNOSIS — A409 Streptococcal sepsis, unspecified: Secondary | ICD-10-CM | POA: Diagnosis not present

## 2020-09-08 DIAGNOSIS — M726 Necrotizing fasciitis: Secondary | ICD-10-CM | POA: Diagnosis not present

## 2020-09-08 LAB — TYPE AND SCREEN
ABO/RH(D): O POS
Antibody Screen: NEGATIVE
Unit division: 0
Unit division: 0
Unit division: 0

## 2020-09-08 LAB — CBC WITH DIFFERENTIAL/PLATELET
Abs Immature Granulocytes: 1.39 10*3/uL — ABNORMAL HIGH (ref 0.00–0.07)
Basophils Absolute: 0 10*3/uL (ref 0.0–0.1)
Basophils Relative: 0 %
Eosinophils Absolute: 0.3 10*3/uL (ref 0.0–0.5)
Eosinophils Relative: 2 %
HCT: 24.7 % — ABNORMAL LOW (ref 39.0–52.0)
Hemoglobin: 8 g/dL — ABNORMAL LOW (ref 13.0–17.0)
Immature Granulocytes: 8 %
Lymphocytes Relative: 11 %
Lymphs Abs: 1.9 10*3/uL (ref 0.7–4.0)
MCH: 29.3 pg (ref 26.0–34.0)
MCHC: 32.4 g/dL (ref 30.0–36.0)
MCV: 90.5 fL (ref 80.0–100.0)
Monocytes Absolute: 1.1 10*3/uL — ABNORMAL HIGH (ref 0.1–1.0)
Monocytes Relative: 7 %
Neutro Abs: 12.1 10*3/uL — ABNORMAL HIGH (ref 1.7–7.7)
Neutrophils Relative %: 72 %
Platelets: 505 10*3/uL — ABNORMAL HIGH (ref 150–400)
RBC: 2.73 MIL/uL — ABNORMAL LOW (ref 4.22–5.81)
RDW: 16.4 % — ABNORMAL HIGH (ref 11.5–15.5)
WBC: 16.9 10*3/uL — ABNORMAL HIGH (ref 4.0–10.5)
nRBC: 0.5 % — ABNORMAL HIGH (ref 0.0–0.2)

## 2020-09-08 LAB — BPAM RBC
Blood Product Expiration Date: 202202102359
Blood Product Expiration Date: 202202122359
Blood Product Expiration Date: 202202132359
ISSUE DATE / TIME: 202201100848
ISSUE DATE / TIME: 202201111502
ISSUE DATE / TIME: 202201120832
Unit Type and Rh: 5100
Unit Type and Rh: 5100
Unit Type and Rh: 5100

## 2020-09-08 LAB — CULTURE, BLOOD (ROUTINE X 2)
Culture: NO GROWTH
Culture: NO GROWTH

## 2020-09-08 LAB — HEPATIC FUNCTION PANEL
ALT: 28 U/L (ref 0–44)
AST: 31 U/L (ref 15–41)
Albumin: 2.1 g/dL — ABNORMAL LOW (ref 3.5–5.0)
Alkaline Phosphatase: 68 U/L (ref 38–126)
Bilirubin, Direct: 0.3 mg/dL — ABNORMAL HIGH (ref 0.0–0.2)
Indirect Bilirubin: 0.8 mg/dL (ref 0.3–0.9)
Total Bilirubin: 1.1 mg/dL (ref 0.3–1.2)
Total Protein: 6.2 g/dL — ABNORMAL LOW (ref 6.5–8.1)

## 2020-09-08 LAB — BASIC METABOLIC PANEL
Anion gap: 11 (ref 5–15)
BUN: 40 mg/dL — ABNORMAL HIGH (ref 8–23)
CO2: 24 mmol/L (ref 22–32)
Calcium: 7.8 mg/dL — ABNORMAL LOW (ref 8.9–10.3)
Chloride: 110 mmol/L (ref 98–111)
Creatinine, Ser: 1.94 mg/dL — ABNORMAL HIGH (ref 0.61–1.24)
GFR, Estimated: 38 mL/min — ABNORMAL LOW (ref >60)
Glucose, Bld: 116 mg/dL — ABNORMAL HIGH (ref 70–99)
Potassium: 3.6 mmol/L (ref 3.5–5.1)
Sodium: 145 mmol/L (ref 135–145)

## 2020-09-08 LAB — PHOSPHORUS: Phosphorus: 3.2 mg/dL (ref 2.5–4.6)

## 2020-09-08 LAB — CK: Total CK: 660 U/L — ABNORMAL HIGH (ref 49–397)

## 2020-09-08 LAB — HEPARIN LEVEL (UNFRACTIONATED)
Heparin Unfractionated: 0.23 IU/mL — ABNORMAL LOW (ref 0.30–0.70)
Heparin Unfractionated: 0.24 IU/mL — ABNORMAL LOW (ref 0.30–0.70)

## 2020-09-08 LAB — MAGNESIUM: Magnesium: 1.4 mg/dL — ABNORMAL LOW (ref 1.7–2.4)

## 2020-09-08 MED ORDER — MAGNESIUM SULFATE 2 GM/50ML IV SOLN
2.0000 g | Freq: Once | INTRAVENOUS | Status: AC
  Administered 2020-09-08: 2 g via INTRAVENOUS
  Filled 2020-09-08: qty 50

## 2020-09-08 MED ORDER — HYDROMORPHONE HCL 1 MG/ML IJ SOLN
1.0000 mg | INTRAMUSCULAR | Status: DC | PRN
  Administered 2020-09-08 – 2020-09-15 (×34): 1 mg via INTRAVENOUS
  Filled 2020-09-08 (×35): qty 1

## 2020-09-08 MED ORDER — OXYCODONE HCL 5 MG PO TABS
10.0000 mg | ORAL_TABLET | ORAL | Status: DC | PRN
  Administered 2020-09-08 – 2020-09-14 (×19): 10 mg via ORAL
  Filled 2020-09-08 (×19): qty 2

## 2020-09-08 MED ORDER — DILTIAZEM HCL 60 MG PO TABS
30.0000 mg | ORAL_TABLET | Freq: Four times a day (QID) | ORAL | Status: DC
Start: 2020-09-08 — End: 2020-09-10
  Administered 2020-09-08 – 2020-09-10 (×7): 30 mg via ORAL
  Filled 2020-09-08 (×8): qty 1

## 2020-09-08 MED ORDER — LINEZOLID 600 MG/300ML IV SOLN
600.0000 mg | Freq: Two times a day (BID) | INTRAVENOUS | Status: DC
  Administered 2020-09-08 – 2020-09-11 (×6): 600 mg via INTRAVENOUS
  Filled 2020-09-08 (×7): qty 300

## 2020-09-08 MED ORDER — GABAPENTIN 300 MG PO CAPS
300.0000 mg | ORAL_CAPSULE | Freq: Three times a day (TID) | ORAL | Status: DC
  Administered 2020-09-08 – 2020-09-14 (×18): 300 mg via ORAL
  Filled 2020-09-08 (×19): qty 1

## 2020-09-08 NOTE — Evaluation (Signed)
Occupational Therapy Evaluation Patient Details Name: Connor [name] MRN: [id number] DOB: [birth year] Today's Date: 09/08/2020    History of Present Illness The pt is a 65 yo male presenting originally for nausea/vomiting, fever, and LLE pain and swelling. Pt found to have sepsis and rhabdomyolysis stemming from LLE cellulitis/myofascitis. 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 PMH includes: AAA, CABG, and HTN.   Clinical Impression   Pt was living with his cousin who has autism and walking with a cane. He was driving and independent in ADL and IADL. Limited evaluation today due to severe L LE pain s/p AKA. Pt presents with generalized weakness and impaired problem solving. He currently requires set up to total assist for ADL. Initiated level 3 theraband exercises for UEs. Will follow acutely.     Follow Up Recommendations  SNF;Supervision/Assistance - 24 hour    Equipment Recommendations  Wheelchair (measurements OT);Wheelchair cushion (measurements OT);3 in 1 bedside commode    Recommendations for Other Services       Precautions / Restrictions Precautions Precautions: Fall Precaution Comments: LLE wound vac Restrictions Weight Bearing Restrictions: Yes LLE Weight Bearing: Non weight bearing      Mobility Bed Mobility Overal bed mobility: Needs Assistance Bed Mobility: Rolling Rolling: Mod assist         General bed mobility comments: pt unable to tolerate attempt to sit EOB due to severe pain    Transfers                 General transfer comment: unable    Balance                                           ADL either performed or assessed with clinical judgement   ADL Overall ADL's : Needs assistance/impaired Eating/Feeding: Set up;Bed level   Grooming: Set up;Bed level;Wash/dry hands;Wash/dry face   Upper Body Bathing: Maximal assistance;Bed level   Lower Body Bathing: Total assistance;Bed level   Upper Body  Dressing : Moderate assistance;Bed level   Lower Body Dressing: Total assistance;Bed level       Toileting- Clothing Manipulation and Hygiene: Total assistance;Bed level               Vision Patient Visual Report: No change from baseline       Perception     Praxis      Pertinent Vitals/Pain Pain Assessment: 0-10 Pain Score: 10-Worst pain ever Pain Location: L LE Pain Descriptors / Indicators: Grimacing;Guarding;Sore Pain Intervention(s): Repositioned;Patient requesting pain meds-RN notified;RN gave pain meds during session     Hand Dominance Right   Extremity/Trunk Assessment Upper Extremity Assessment Upper Extremity Assessment: Generalized weakness   Lower Extremity Assessment Lower Extremity Assessment: LLE deficits/detail LLE Deficits / Details: L LE AKA with wound vac   Cervical / Trunk Assessment Cervical / Trunk Assessment: Other exceptions (increased body habitus)   Communication Communication Communication: No difficulties   Cognition Arousal/Alertness: Awake/alert Behavior During Therapy: WFL for tasks assessed/performed Overall Cognitive Status: Impaired/Different from baseline Area of Impairment: Problem solving                             Problem Solving: Decreased initiation;Requires verbal cues General Comments: pt with multiple alarms sounding upon OT's arrival, but had made no effort to call for assistance   General Comments  Exercises     Shoulder Instructions      Home Living Family/patient expects to be discharged to:: Private residence Living Arrangements: Other relatives (cousin who cannot provide physical assist) Available Help at Discharge: Family;Available PRN/intermittently Type of Home: Mobile home Home Access: Ramped entrance     Home Layout: One level     Bathroom Shower/Tub: Chief Strategy Officer: Standard     Home Equipment: Cane - single point          Prior  Functioning/Environment Level of Independence: Independent with assistive device(s)        Comments: 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[employer], drives, 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        OT Problem List: Decreased strength;Decreased activity tolerance;Impaired balance (sitting and/or standing);Pain;Decreased knowledge of use of DME or AE;Decreased cognition      OT Treatment/Interventions: Self-care/ADL training;DME and/or AE instruction;Patient/family education;Balance training;Therapeutic activities;Therapeutic exercise    OT Goals(Current goals can be found in the care plan section) Acute Rehab OT Goals Patient Stated Goal: pain relief OT Goal Formulation: With patient Time For Goal Achievement: 09/22/20 Potential to Achieve Goals: Fair  OT Frequency: Min 2X/week   Barriers to D/C: Decreased caregiver support          Co-evaluation              AM-PAC OT "6 Clicks" Daily Activity     Outcome Measure Help from another person eating meals?: None Help from another person taking care of personal grooming?: A Little Help from another person toileting, which includes using toliet, bedpan, or urinal?: Total Help from another person bathing (including washing, rinsing, drying)?: A Lot Help from another person to put on and taking off regular upper body clothing?: A Lot Help from another person to put on and taking off regular lower body clothing?: Total 6 Click Score: 13   End of Session Nurse Communication: Patient requests pain meds  Activity Tolerance: Patient limited by pain Patient left: in bed;with bed alarm set;with nursing/sitter in room  OT Visit Diagnosis: Pain;Muscle weakness (generalized) (M62.81);Other symptoms and signs involving cognitive function                Time: 1100-1121 OT Time Calculation (min): 21 min Charges:  OT General Charges $OT Visit: 1 Visit OT Evaluation $OT Eval Moderate Complexity: 1 Mod  Martie Round, OTR/L Acute Rehabilitation  Services Pager: [phone] Office: [phone]  Evern Bio 09/08/2020, 11:24 AM

## 2020-09-08 NOTE — Progress Notes (Signed)
ANTICOAGULATION CONSULT NOTE - Follow-Up Consult  Pharmacy Consult for IV Heparin Indication: atrial fibrillation  No Known Allergies  Patient Measurements: Height: 5\' 11"  (180.3 cm) Weight: 120.8 kg (266 lb 5.1 oz) IBW/kg (Calculated) : 75.3 Heparin Dosing Weight: 100 kg  Vital Signs: Temp: 98.1 F (36.7 C) (01/12 2359) Temp Source: Oral (01/12 2359) BP: 119/74 (01/12 2359) Pulse Rate: 99 (01/12 2359)  Labs: Recent Labs    09/05/20 0420 09/06/20 0441 09/06/20 2107 09/06/20 2147 09/07/20 0019 09/07/20 1119 09/07/20 1744 09/08/20 0159  HGB 6.8* 7.5* 8.3*  --  7.7* 8.5*  --  8.0*  HCT 19.0* 22.1* 24.0*  --  23.8* 25.1*  --  24.7*  PLT 530* 538*  --   --  487*  --   --  505*  APTT 154*  --   --   --   --   --   --   --   HEPARINUNFRC 0.69 0.11* 0.12*  --   --   --  0.17* 0.23*  CREATININE 4.96* 3.73*  --   --  2.84*  --   --  1.94*  CKTOTAL 2,084* 1,066*  --   --  803*  --   --  660*  TROPONINIHS  --   --   --  32* 33*  --   --   --     Estimated Creatinine Clearance: 51.5 mL/min (A) (by C-G formula based on SCr of 1.94 mg/dL (H)).   Medical History: Past Medical History:  Diagnosis Date  . AAA (abdominal aortic aneurysm) (HCC)   . History of open heart surgery   . Hypertension   . Seroma due to trauma [facility])     Assessment: 65 yr old male with hx Afib on apixaban 5 mg bid PTA  was admitted with bacteremia. Apixaban was on hold for possible procedures during acute illness and pt was started on IV heparin. IV heparin was briefly stopped on 1/10, with plans to transition back to apixaban, but IV heparin was resumed, as patient will need orthopedic intervention. Pt is S/P L leg debridement, fasciotomies today. Per RN, heparin was stopped for surgery and resumed at 1550 units/hr at ~1100 AM today.  Heparin level ~6 hrs after heparin was resumed post op at 1550 units/hr was 0.17 units/ml, which is below the goal range for this pt. H/H 8.5/25.1, plt 487. Per RN, no  issues with IV or bleeding observed.  1/13 AM update:  Heparin level below goal  No issues per RN  Goal of Therapy:  Heparin level 0.3-0.7 units/ml Monitor platelets by anticoagulation protocol: Yes   Plan:  Increase heparin infusion to 1900 units/hr Check heparin level in 8 hours Monitor daily HL, CBC, s/sx bleeding  2/13, PharmD, BCPS Clinical Pharmacist Phone: [phone]

## 2020-09-08 NOTE — Anesthesia Preprocedure Evaluation (Addendum)
Anesthesia Evaluation  Patient identified by MRN, date of birth, ID band Patient awake    Reviewed: Allergy & Precautions, NPO status , Patient's Chart, lab work & pertinent test results  Airway Mallampati: III  TM Distance: >3 FB Neck ROM: Full    Dental  (+) Poor Dentition, Dental Advisory Given   Pulmonary neg pulmonary ROS, Current Smoker and Patient abstained from smoking.,    Pulmonary exam normal breath sounds clear to auscultation       Cardiovascular hypertension, Pt. on medications and Pt. on home beta blockers + CABG  Normal cardiovascular exam+ dysrhythmias Atrial Fibrillation  Rhythm:Regular Rate:Normal  Echo 08/2020: 1. Left ventricular ejection fraction, by estimation, is 60 to 65%. The  left ventricle has normal function.  2. Right ventricular systolic function is normal. The right ventricular  size is normal. Pulmonary artery systolic pressure is 32mmHg+ RAP.  3. Left atrial size was severely dilated. No left atrial/left atrial  appendage thrombus was detected.  4. The mitral valve is normal in structure. Trivial mitral valve  regurgitation.  5. The aortic valve is tricuspid. Aortic valve regurgitation is trivial.  No aortic stenosis is present.  6. Agitated saline contrast bubble study was positive with shunting  observed after >6 cardiac cycles suggestive of intrapulmonary shunting.  7. Right atrial size was severely dilated.  8. No valvular vegetations visualized.    PFO   Neuro/Psych negative neurological ROS  negative psych ROS   GI/Hepatic negative GI ROS, Neg liver ROS,   Endo/Other  Obesity BMI 35  Renal/GU CRF and Renal InsufficiencyRenal diseaseCr 1.94- AKI likely secondary to acute tubular injury decreased renal perfusion from relative hypotension, rhabdomyolysis in the setting of NSAID, spironolactone, and valsartan use  negative genitourinary   Musculoskeletal Necrotizing  fasciitis LLE   Abdominal   Peds  Hematology negative hematology ROS (+)   Anesthesia Other Findings Sepsis 2/2 bacteremia, UTI, necrotizing fasciitis LLE s/p fasciotomies, debridement yes    Reproductive/Obstetrics negative OB ROS                           Anesthesia Physical Anesthesia Plan  ASA: IV  Anesthesia Plan: General   Post-op Pain Management:    Induction: Intravenous  PONV Risk Score and Plan: Ondansetron, Treatment may vary due to age or medical condition and Midazolam  Airway Management Planned: Oral ETT  Additional Equipment:   Intra-op Plan:   Post-operative Plan: Extubation in OR  Informed Consent: I have reviewed the patients History and Physical, chart, labs and discussed the procedure including the risks, benefits and alternatives for the proposed anesthesia with the patient or authorized representative who has indicated his/her understanding and acceptance.     Dental advisory given  Plan Discussed with: CRNA  Anesthesia Plan Comments:        Anesthesia Quick Evaluation

## 2020-09-08 NOTE — Progress Notes (Signed)
[facility]   Reason for visit: Follow up on necrotizing infection  Interval History: main complaint is of pain.  WBC stable at 16.9, remains afebrile.  Plan for further surgical debridement by Dr. Lajoyce Corners tomorrow.   Physical Exam: Constitutional:  Vitals:   09/08/20 0850 09/08/20 1000  BP: (!) 147/99 (!) 132/94  Pulse: 99 (!) 105  Resp: 20   Temp: 98.4 F (36.9 C)   SpO2: 96%    patient appears in NAD Respiratory: Normal respiratory effort; CTA B Cardiovascular: RRR GI: soft, nt, nd  Review of Systems: Constitutional: negative for fevers and chills Gastrointestinal: negative for nausea and diarrhea Integument/breast: negative for rash  Lab Results  Component Value Date   WBC 16.9 (H) 09/08/2020   HGB 8.0 (L) 09/08/2020   HCT 24.7 (L) 09/08/2020   MCV 90.5 09/08/2020   PLT 505 (H) 09/08/2020    Lab Results  Component Value Date   CREATININE 1.94 (H) 09/08/2020   BUN 40 (H) 09/08/2020   NA 145 09/08/2020   K 3.6 09/08/2020   CL 110 09/08/2020   CO2 24 09/08/2020    Lab Results  Component Value Date   ALT 28 09/08/2020   AST 31 09/08/2020   ALKPHOS 68 09/08/2020     Microbiology: Recent Results (from the past 240 hour(s))  Blood Culture (routine x 2)     Status: Abnormal   Collection Time: 08/30/20  7:34 PM   Specimen: BLOOD  Result Value Ref Range Status   Specimen Description   Final    BLOOD BLOOD LEFT ARM Performed at [facility], [address], [place], [place] [address]    Special Requests   Final    BOTTLES DRAWN AEROBIC AND ANAEROBIC Blood Culture adequate volume Performed at [facility], [address] [place], [place] [address]    Culture  Setup Time   Final    GRAM POSITIVE COCCI IN CHAINS ANAEROBIC BOTTLE ONLY Gram Stain Report Called to,Read Back By and Verified With: MICHAEL DOSS @0905  08/31/20 BY JONES,T [facility] CORRECTED RESULTS PREVIOUSLY REPORTED AS: GRAM POSITIVE RODS CORRECTED RESULTS CALLED TO: S HURTH  PHARMD @1409  08/31/20 EB Performed at [facility], [address] [place], [address] [place]    Culture STREPTOCOCCUS GROUP G (A)  Final   Report Status 09/02/2020 FINAL  Final   Organism ID, Bacteria STREPTOCOCCUS GROUP G  Final      Susceptibility   Streptococcus group g - MIC*    CLINDAMYCIN <=0.25 SENSITIVE Sensitive     AMPICILLIN <=0.25 SENSITIVE Sensitive     ERYTHROMYCIN <=0.12 SENSITIVE Sensitive     VANCOMYCIN <=0.12 SENSITIVE Sensitive     CEFTRIAXONE <=0.12 SENSITIVE Sensitive     LEVOFLOXACIN 0.5 SENSITIVE Sensitive     * STREPTOCOCCUS GROUP G  Blood Culture (routine x 2)     Status: Abnormal   Collection Time: 08/30/20  7:35 PM   Specimen: BLOOD RIGHT ARM  Result Value Ref Range Status   Specimen Description   Final    BLOOD RIGHT ARM Performed at [facility], [address], [place], [address] [place]    Special Requests   Final    BOTTLES DRAWN AEROBIC AND ANAEROBIC Blood Culture adequate volume Performed at [facility], [address] [place], [address] [place]    Culture  Setup Time   Final    GRAM POSITIVE COCCI IN CHAINS IN BOTH AEROBIC AND ANAEROBIC BOTTLES Gram Stain Report Called to,Read  Back By and Verified With: MICHAEL DOSS @0905  08/31/20 BY JONES,T [facility] Organism ID to follow CORRECTED RESULTS PREVIOUSLY REPORTED AS: GRAM POSITIVE RODS CORRECTED RESULTS CALLED TO: S HURTH PHARMD @1409  08/31/20 EB    Culture (A)  Final    STREPTOCOCCUS GROUP G SUSCEPTIBILITIES PERFORMED ON PREVIOUS CULTURE WITHIN THE LAST 5 DAYS. Performed at [facility], [address] [place], [address] [place]    Report Status 09/02/2020 FINAL  Final  Resp Panel by RT-PCR (Flu A&B, Covid) Nasopharyngeal Swab     Status: None   Collection Time: 08/30/20  7:35 PM   Specimen: Nasopharyngeal Swab; Nasopharyngeal(NP) swabs in vial transport medium  Result Value Ref Range Status   SARS Coronavirus 2 by RT PCR NEGATIVE NEGATIVE Final    Comment: (NOTE) SARS-CoV-2 target  nucleic acids are NOT DETECTED.  The SARS-CoV-2 RNA is generally detectable in upper respiratory specimens during the acute phase of infection. The lowest concentration of SARS-CoV-2 viral copies this assay can detect is 138 copies/mL. A negative result does not preclude SARS-Cov-2 infection and should not be used as the sole basis for treatment or other patient management decisions. A negative result may occur with  improper specimen collection/handling, submission of specimen other than nasopharyngeal swab, presence of viral mutation(s) within the areas targeted by this assay, and inadequate number of viral copies(<138 copies/mL). A negative result must be combined with clinical observations, patient history, and epidemiological information. The expected result is Negative.  Fact Sheet for Patients:  10/31/2020  Fact Sheet for Healthcare Providers:  10/28/20  This test is no t yet approved or cleared by the [employer] FDA and  has been authorized for detection and/or diagnosis of SARS-CoV-2 by FDA under an Emergency Use Authorization (EUA). This EUA will remain  in effect (meaning this test can be used) for the duration of the COVID-19 declaration under Section 564(b)(1) of the Act, 21 U.S.C.section 360bbb-3(b)(1), unless the authorization is terminated  or revoked sooner.       Influenza A by PCR NEGATIVE NEGATIVE Final   Influenza B by PCR NEGATIVE NEGATIVE Final    Comment: (NOTE) The Xpert Xpress SARS-CoV-2/FLU/RSV plus assay is intended as an aid in the diagnosis of influenza from Nasopharyngeal swab specimens and should not be used as a sole basis for treatment. Nasal washings and aspirates are unacceptable for Xpert Xpress SARS-CoV-2/FLU/RSV testing.  Fact Sheet for Patients: [employer]ious[employer]ker.it  Fact Sheet for Healthcare  Providers: [place]  This test is not yet approved or cleared by the [employer] FDA and has been authorized for detection and/or diagnosis of SARS-CoV-2 by FDA under an Emergency Use Authorization (EUA). This EUA will remain in effect (meaning this test can be used) for the duration of the COVID-19 declaration under Section 564(b)(1) of the Act, 21 U.S.C. section 360bbb-3(b)(1), unless the authorization is terminated or revoked.  Performed at [facility], [address] [place], [address] [place]   Blood Culture ID Panel (Reflexed)     Status: Abnormal   Collection Time: 08/30/20  7:35 PM  Result Value Ref Range Status   Enterococcus faecalis NOT DETECTED NOT DETECTED Final   Enterococcus Faecium NOT DETECTED NOT DETECTED Final   Listeria monocytogenes NOT DETECTED NOT DETECTED Final   Staphylococcus species NOT DETECTED NOT DETECTED Final   Staphylococcus aureus (BCID) NOT DETECTED NOT DETECTED Final   Staphylococcus epidermidis NOT DETECTED NOT DETECTED Final   Staphylococcus lugdunensis NOT DETECTED NOT DETECTED Final   Streptococcus species DETECTED (A)  NOT DETECTED Final    Comment: Not Enterococcus species, Streptococcus agalactiae, Streptococcus pyogenes, or Streptococcus pneumoniae. CRITICAL RESULT CALLED TO, READ BACK BY AND VERIFIED WITH: G. Coffee PharmD 16:30 08/31/20 ([email or web address])    Streptococcus agalactiae NOT DETECTED NOT DETECTED Final   Streptococcus pneumoniae NOT DETECTED NOT DETECTED Final   Streptococcus pyogenes NOT DETECTED NOT DETECTED Final   A.calcoaceticus-baumannii NOT DETECTED NOT DETECTED Final   Bacteroides fragilis NOT DETECTED NOT DETECTED Final   Enterobacterales NOT DETECTED NOT DETECTED Final   Enterobacter cloacae complex NOT DETECTED NOT DETECTED Final   Escherichia coli NOT DETECTED NOT DETECTED Final   Klebsiella aerogenes NOT DETECTED NOT DETECTED Final   Klebsiella oxytoca NOT DETECTED NOT DETECTED Final    Klebsiella pneumoniae NOT DETECTED NOT DETECTED Final   Proteus species NOT DETECTED NOT DETECTED Final   Salmonella species NOT DETECTED NOT DETECTED Final   Serratia marcescens NOT DETECTED NOT DETECTED Final   Haemophilus influenzae NOT DETECTED NOT DETECTED Final   Neisseria meningitidis NOT DETECTED NOT DETECTED Final   Pseudomonas aeruginosa NOT DETECTED NOT DETECTED Final   Stenotrophomonas maltophilia NOT DETECTED NOT DETECTED Final   Candida albicans NOT DETECTED NOT DETECTED Final   Candida auris NOT DETECTED NOT DETECTED Final   Candida glabrata NOT DETECTED NOT DETECTED Final   Candida krusei NOT DETECTED NOT DETECTED Final   Candida parapsilosis NOT DETECTED NOT DETECTED Final   Candida tropicalis NOT DETECTED NOT DETECTED Final   Cryptococcus neoformans/gattii NOT DETECTED NOT DETECTED Final    Comment: Performed at [facility], [address], [place], [place] [address]  Urine culture     Status: Abnormal   Collection Time: 08/31/20  1:30 AM   Specimen: In/Out Cath Urine  Result Value Ref Range Status   Specimen Description   Final    IN/OUT CATH URINE Performed at [facility], [address] [place], [place] [address]    Special Requests   Final    NONE Performed at [facility], [address] [place], [place] [address]    Culture 1,000 COLONIES/mL ENTEROCOCCUS FAECALIS (A)  Final   Report Status 09/02/2020 FINAL  Final   Organism ID, Bacteria ENTEROCOCCUS FAECALIS (A)  Final      Susceptibility   Enterococcus faecalis - MIC*    AMPICILLIN <=2 SENSITIVE Sensitive     NITROFURANTOIN <=16 SENSITIVE Sensitive     VANCOMYCIN 1 SENSITIVE Sensitive     * 1,000 COLONIES/mL ENTEROCOCCUS FAECALIS  MRSA PCR Screening     Status: None   Collection Time: 08/31/20 11:36 AM   Specimen: Nasal Mucosa; Nasopharyngeal  Result Value Ref Range Status   MRSA by PCR NEGATIVE NEGATIVE Final    Comment:        The GeneXpert MRSA Assay (FDA approved for NASAL  specimens only), is one component of a comprehensive MRSA colonization surveillance program. It is not intended to diagnose MRSA infection nor to guide or monitor treatment for MRSA infections. Performed at [facility], [address] [place], [place] [address]   Culture, blood (routine x 2)     Status: None   Collection Time: 09/03/20  5:38 PM   Specimen: BLOOD RIGHT HAND  Result Value Ref Range Status   Specimen Description BLOOD RIGHT HAND  Final   Special Requests   Final    AEROBIC BOTTLE ONLY Blood Culture results may not be optimal due to an inadequate volume of blood received in culture bottles   Culture  Final    NO GROWTH 5 DAYS Performed at [facility], [address] [place], [place] [address]    Report Status 09/08/2020 FINAL  Final  Culture, blood (routine x 2)     Status: None   Collection Time: 09/03/20  5:39 PM   Specimen: BLOOD RIGHT HAND  Result Value Ref Range Status   Specimen Description BLOOD RIGHT HAND  Final   Special Requests   Final    AEROBIC BOTTLE ONLY Blood Culture results may not be optimal due to an inadequate volume of blood received in culture bottles   Culture   Final    NO GROWTH 5 DAYS Performed at [facility], [address] [place], [place] [address]    Report Status 09/08/2020 FINAL  Final  Aerobic/Anaerobic Culture (surgical/deep wound)     Status: None (Preliminary result)   Collection Time: 09/07/20  9:02 AM   Specimen: Leg, Left; Tissue  Result Value Ref Range Status   Specimen Description TISSUE  Final   Special Requests LEFT THIGH FACIA SPEC A  Final   Gram Stain NO WBC SEEN NO ORGANISMS SEEN   Final   Culture   Final    NO GROWTH < 24 HOURS Performed at [facility], [address] [place], [place] [address]    Report Status PENDING  Incomplete  Aerobic/Anaerobic Culture (surgical/deep wound)     Status: None (Preliminary result)   Collection Time: 09/07/20  9:05 AM   Specimen: Leg, Left; Tissue   Result Value Ref Range Status   Specimen Description TISSUE  Final   Special Requests LEFT CALF MUSCLE SPEC B  Final   Gram Stain   Final    RARE WBC PRESENT, PREDOMINANTLY MONONUCLEAR NO ORGANISMS SEEN    Culture   Final    NO GROWTH < 24 HOURS Performed at [facility], [address] [place], [place] [address]    Report Status PENDING  Incomplete    Impression/Plan:  1. Necrotizing fasciitis of leg, s/p AKA - previous cultures with Strep group G.  Cultures also sent from the OR and I have added linezolid pending these cultures to cover more broadly and anti-toxin effect.    2.  Rhabdomyolysis - improving CK , creat improved.  Continuing with IVFs and encouraging hydration.    3.  Bacteremia - Group G Strep and on antibiotics as above.

## 2020-09-08 NOTE — H&P (View-Only) (Signed)
Patient ID: Connor [name], male   DOB: 03/09/1956, 64 y.o.   MRN: [id number] Patient is postoperative day 1 left above-the-knee amputation.  The wound was packed open and currently undergoing installation wound VAC therapy.  Plan for return to the operating room tomorrow to further debride the margins and anticipate closing the wound.  Patient states he is having significant pain, I increased his oral and IV pain medications and added Neurontin.  Intraoperative findings showed that all of the muscles in the leg were nonviable. 

## 2020-09-08 NOTE — Progress Notes (Signed)
Physical Therapy Treatment and Re-evaluation Patient Details Name: Connor [name] MRN: [id number] DOB: February 25, 1956 Today's Date: 09/08/2020    History of Present Illness The pt is a 65 yo male presenting originally for nausea/vomiting, fever, and LLE pain and swelling. Pt found to have sepsis and rhabdomyolysis stemming from LLE cellulitis/myofascitis. 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 PMH includes: AAA, CABG, and HTN.    PT Comments    Patient now s/p Lt AKA. Patient with 8/10 pain and after bed mobility for cleaning and changing sheets and exercises, he could not tolerate attempting sit EOB. He rolled and moved LLE very well for post-op day 1 and feel he could get to modified independent at wheelchair level with intensive therapies.     Follow Up Recommendations  CIR     Equipment Recommendations  Rolling walker with 5" wheels    Recommendations for Other Services Rehab consult     Precautions / Restrictions Precautions Precautions: Fall Precaution Comments: LLE wound vac    Mobility  Bed Mobility Overal bed mobility: Needs Assistance Bed Mobility: Rolling Rolling: Min guard         General bed mobility comments: pt unable to tolerate attempt to sit EOB due to severe pain (despite premedication)  Transfers                 General transfer comment: unable  Ambulation/Gait                 Stairs             Wheelchair Mobility    Modified Rankin (Stroke Patients Only)       Balance                                            Cognition Arousal/Alertness: Lethargic;Suspect due to medications Behavior During Therapy: [facility] for tasks assessed/performed Overall Cognitive Status: Impaired/Different from baseline Area of Impairment: Problem solving;Awareness                         Safety/Judgement: Decreased awareness of safety Awareness: Intellectual Problem Solving: Decreased  initiation;Requires verbal cues;Difficulty sequencing General Comments: pt unaware he'd been incontinent of bowels      Exercises Other Exercises Other Exercises: Lying flat in supine during assessment for ~10 minutes for hip flexor stretch Other Exercises: hip abduction/adduction x 5 Other Exercises: LLE hip flexion x 3 reps    General Comments General comments (skin integrity, edema, etc.): Educated on importance of maintaining hip extension and adduction      Pertinent Vitals/Pain Pain Assessment: 0-10 Faces Pain Scale: Hurts whole lot Pain Location: L LE Pain Descriptors / Indicators: Grimacing;Guarding;Sore Pain Intervention(s): Limited activity within patient's tolerance;Monitored during session;Premedicated before session;Repositioned    Home Living                      Prior Function            PT Goals (current goals can now be found in the care plan section) Acute Rehab PT Goals Patient Stated Goal: pain relief PT Goal Formulation: With patient Time For Goal Achievement: 09/22/20 Potential to Achieve Goals: Good Progress towards PT goals: Goals downgraded-see care plan    Frequency    Min 3X/week      PT Plan Current plan remains appropriate  Co-evaluation              AM-PAC PT "6 Clicks" Mobility   Outcome Measure  Help needed turning from your back to your side while in a flat bed without using bedrails?: A Little Help needed moving from lying on your back to sitting on the side of a flat bed without using bedrails?: A Little Help needed moving to and from a bed to a chair (including a wheelchair)?: A Lot Help needed standing up from a chair using your arms (e.g., wheelchair or bedside chair)?: A Lot Help needed to walk in hospital room?: Total Help needed climbing 3-5 steps with a railing? : Total 6 Click Score: 12    End of Session   Activity Tolerance: Patient limited by pain Patient left: in bed;with call bell/phone within  reach;with bed alarm set (on bed pan) Nurse Communication: Mobility status;Other (comment) (pt reporting transient tingling in RUE (no focal signs noted)) PT Visit Diagnosis: Unsteadiness on feet (R26.81);Other abnormalities of gait and mobility (R26.89);Muscle weakness (generalized) (M62.81)     Time: 4627-0350 PT Time Calculation (min) (ACUTE ONLY): 46 min  Charges:  $Therapeutic Exercise: 8-22 mins $Therapeutic Activity: 8-22 mins                      Jerolyn Center, PT Pager [phone]    Zena Amos 09/08/2020, 5:30 PM

## 2020-09-08 NOTE — Progress Notes (Signed)
Patient ID: Connor [name], male   DOB: 1956/06/07, 65 y.o.   MRN: [id number] Patient is postoperative day 1 left above-the-knee amputation.  The wound was packed open and currently undergoing installation wound VAC therapy.  Plan for return to the operating room tomorrow to further debride the margins and anticipate closing the wound.  Patient states he is having significant pain, I increased his oral and IV pain medications and added Neurontin.  Intraoperative findings showed that all of the muscles in the leg were nonviable.

## 2020-09-08 NOTE — Progress Notes (Signed)
Progress Note  Patient Name: Connor [name] Date of Encounter: 09/08/2020  [facility] Cardiologist: Little Ishikawa, MD   Subjective   No CP or dyspnea; complains of pain at surgical site  Inpatient Medications    Scheduled Meds: . sodium chloride   Intravenous Once  . aspirin EC  81 mg Oral Daily  . feeding supplement  237 mL Oral BID BM  . gabapentin  300 mg Oral TID  . labetalol  200 mg Oral BID  . mouth rinse  15 mL Mouth Rinse BID  . NIFEdipine  30 mg Oral Daily  . sodium chloride flush  10-40 mL Intracatheter Q12H   Continuous Infusions: . sodium chloride    . sodium chloride Stopped (09/07/20 1607)  . cefTRIAXone (ROCEPHIN)  IV 2 g (09/07/20 2210)  . heparin 1,900 Units/hr (09/08/20 0439)   PRN Meds: hydrALAZINE, HYDROmorphone (DILAUDID) injection, metoCLOPramide **OR** metoCLOPramide (REGLAN) injection, ondansetron **OR** ondansetron (ZOFRAN) IV, oxyCODONE, sodium chloride flush   Vital Signs    Vitals:   09/07/20 1932 09/07/20 2359 09/08/20 0405 09/08/20 0700  BP: (!) 168/98 119/74 (!) 141/87 130/83  Pulse: 93 99 95   Resp: 20 19 20 20   Temp: 98.1 F (36.7 C) 98.1 F (36.7 C) 98.1 F (36.7 C)   TempSrc: Oral Oral Oral   SpO2: 97% 98% 96%   Weight:   112.3 kg   Height:        Intake/Output Summary (Last 24 hours) at 09/08/2020 0750 Last data filed at 09/08/2020 0700 Gross per 24 hour  Intake 2509.78 ml  Output 3450 ml  Net -940.22 ml   Last 3 Weights 09/08/2020 09/07/2020 09/03/2020  Weight (lbs) 247 lb 9.6 oz 266 lb 5.1 oz 247 lb 12.8 oz  Weight (kg) 112.311 kg 120.8 kg 112.4 kg      Telemetry    Atrial fibrillation rate controlled - Personally Reviewed  Physical Exam   GEN: WD WN NAD Neck: supple, no JVD Cardiac: irregular, mildly tachycardic Respiratory: CTA; no wheeze GI: Soft, NT/ND MS: s/p left AKA Neuro:  Grossly intact  Labs    High Sensitivity Troponin:   Recent Labs  Lab 08/30/20 1926 08/30/20 2215  09/06/20 2147 09/07/20 0019  TROPONINIHS 425* 482* 32* 33*      Chemistry Recent Labs  Lab 09/04/20 1446 09/05/20 0420 09/06/20 0441 09/07/20 0019 09/08/20 0159  NA 142   < > 144 144 145  K 3.0*   < > 3.3* 3.5 3.6  CL 107   < > 109 109 110  CO2 21*   < > 23 23 24   GLUCOSE 124*   < > 99 97 116*  BUN 87*   < > 67* 56* 40*  CREATININE 5.79*   < > 3.73* 2.84* 1.94*  CALCIUM 7.6*   < > 7.8* 7.8* 7.8*  PROT 5.7*  --   --  6.1* 6.2*  ALBUMIN 1.9*  --   --  2.0* 2.1*  AST 75*  --   --  38 31  ALT 45*  --   --  31 28  ALKPHOS 80  --   --  73 68  BILITOT 1.3*  --   --  0.7 1.1  GFRNONAA 10*   < > 17* 24* 38*  ANIONGAP 14   < > 12 12 11    < > = values in this interval not displayed.     Hematology Recent Labs  Lab 09/06/20 0441 09/06/20 2107 09/07/20 0019  09/07/20 1119 09/08/20 0159  WBC 14.9*  --  14.5*  --  16.9*  RBC 2.55*  2.56*  --  2.64*  --  2.73*  HGB 7.5*   < > 7.7* 8.5* 8.0*  HCT 22.1*   < > 23.8* 25.1* 24.7*  MCV 86.7  --  90.2  --  90.5  MCH 29.4  --  29.2  --  29.3  MCHC 33.9  --  32.4  --  32.4  RDW 16.0*  --  16.4*  --  16.4*  PLT 538*  --  487*  --  505*   < > = values in this interval not displayed.    Patient Profile     65 y.o. male with past medical history aortic dissection repair, previous cardiomyopathy improved, persistent atrial fibrillation, hypertension admitted with diarrhea, leg pain with fasciitis, fever.  Found to be bacteremic and also in atrial fibrillation with elevated rate.  Cardiology asked to evaluate.  Echocardiogram shows ejection fraction 60 to 65%, severe left ventricular hypertrophy, grade 2 diastolic dysfunction, biatrial enlargement.  Assessment & Plan    1 bacteremia-previous TEE showed no vegetation. Continue antibiotics for cellulitis/necrotizing fasciitis per ID.  2 persistent atrial fibrillation-heart rate mildly elevated likely driven by recent surgery and anemia.  We will add Cardizem 30 mg every 6 hours and adjust  as needed.  Continue labetalol.  He is essentially asymptomatic and rate control and anticoagulation will likely be best option long-term.  Continue heparin.  Can transition back to apixaban once all procedures complete.    3 acute kidney injury-renal function continues to improve.  Felt secondary to rhabdomyolysis.  4 s/p left AKA-per orthopedics  5 history of aortic arch dissection repair-needs good blood pressure control.  6 hypertension-blood pressure controlled but heart rate elevated.  We will discontinue Procardia and instead treat with Cardizem both for blood pressure and rate control.  Follow blood pressure and adjust regimen as needed.  For questions or updates, please contact [facility] Please consult [email or web address] for contact info under     Signed, Olga Millers, MD  09/08/2020, 7:50 AM

## 2020-09-08 NOTE — Progress Notes (Signed)
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 CPK/rhabdo improved. Fortunately, has turned the corner from an AKI standpoint, time will tell where his kidney function settles out to as he has gone through this process before back in [place] a few years ago (see my consult note). Will follow as an outpatient moving forward -baseline Cr seems to be around 1.2 however that was in 2020 and I do not see labs since then in CareEverywhere -hydrate to thirst -abdominal ultrasound without obstruction -Avoid nephrotoxic medications including NSAIDs and iodinated intravenous contrast exposure unless the latter is absolutely indicated.  Preferred narcotic agents for pain control are hydromorphone, fentanyl, and methadone. Morphine should not be used. Avoid Baclofen and avoid oral sodium phosphate and magnesium citrate based laxatives / bowel preps. Continue strict Input and Output monitoring. 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 Aldactone and valsartan held currently -currently BP up secondary to pain, stopping isotonic fluids today -keep MAP>65 -as kidney function improved, can consider adding back aldactone for BP control  Metabolic acidosis, anion gap. resolved Stable HCO3  Chronic  hypokalemia. 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 Was supposed to see endocrinology for this. Replete PRN -adrenal glands unremarkable on pan-CTA 03/01/2020  Normocytic Anemia -Transfuse for Hgb<7 g/dL. Avoid iron in the setting of bacteremia/acute infection  Will sign off from a nephrology perspective. Will set him up for follow up at our [place] office per his request in 1-2 months. Thank you for allowing [place] to participate in the care of this patient. Please call with any questions/concerns.  [name], MD [name]   Subjective:     No acute events    Objective:   BP 135/83   Pulse (!) 105   Temp 98.4 F (36.9 C) (Oral)   Resp 18   Ht 5\' 11"  (1.803 m)   Wt 112.3 kg   SpO2 96%   BMI 34.53 kg/m   Intake/Output Summary (Last 24 hours) at 09/08/2020 1250 Last data filed at 09/08/2020 1118 Gross per 24 hour  Intake 1090 ml  Output 2900 ml  Net -1810 ml   Weight change: -8.489 kg  Physical Exam: Gen:NAD CVS:reg rate Resp:cta bl, no w/r/r/c heard 09/10/2020, nt Ext: LLE aka, dressings and drains in place Neuro: speech clear and coherent, moves all ext spontaneously  Imaging: No results found.  Labs: BMET Recent Labs  Lab 09/02/20 0416 09/03/20 0419 09/04/20 1446 09/05/20 0420 09/06/20 0441 09/07/20 0019 09/08/20 0159  NA 138 138 142 144 144 144 145  K 3.4* 3.4* 3.0* 3.1* 3.3* 3.5 3.6  CL 103 103 107 110 109 109 110  CO2 18* 20* 21* 23 23 23 24   GLUCOSE 106* 107* 124* 114* 99 97 116*  BUN 91* 99* 87* 79* 67* 56* 40*  CREATININE 7.61* 7.46* 5.79* 4.96* 3.73* 2.84* 1.94*  CALCIUM  7.0* 7.6* 7.6* 7.7* 7.8* 7.8* 7.8*  PHOS  --   --   --   --   --   --  3.2   CBC Recent Labs  Lab 09/05/20 0420 09/06/20 0441 09/06/20 2107 09/07/20 0019 09/07/20 1119 09/08/20 0159  WBC 18.9* 14.9*  --  14.5*  --  16.9*  NEUTROABS  --   --   --   --   --  12.1*  HGB 6.8* 7.5* 8.3* 7.7* 8.5* 8.0*  HCT 19.0* 22.1* 24.0* 23.8*  25.1* 24.7*  MCV 83.0 86.7  --  90.2  --  90.5  PLT 530* 538*  --  487*  --  505*    Medications:    . sodium chloride   Intravenous Once  . aspirin EC  81 mg Oral Daily  . diltiazem  30 mg Oral Q6H  . feeding supplement  237 mL Oral BID BM  . gabapentin  300 mg Oral TID  . labetalol  200 mg Oral BID  . mouth rinse  15 mL Mouth Rinse BID  . sodium chloride flush  10-40 mL Intracatheter Q12H      [name], MD  [name] 09/08/2020, 12:50 PM

## 2020-09-08 NOTE — Progress Notes (Signed)
ANTICOAGULATION CONSULT NOTE - Follow-Up Consult  Pharmacy Consult for IV Heparin Indication: atrial fibrillation  No Known Allergies  Patient Measurements: Height: 5\' 11"  (180.3 cm) Weight: 112.3 kg (247 lb 9.6 oz) IBW/kg (Calculated) : 75.3 Heparin Dosing Weight: 100 kg  Vital Signs: Temp: 98.4 F (36.9 C) (01/13 0850) Temp Source: Oral (01/13 0850) BP: 135/83 (01/13 1100) Pulse Rate: 105 (01/13 1000)  Labs: Recent Labs    09/06/20 0441 09/06/20 2107 09/06/20 2147 09/07/20 0019 09/07/20 1119 09/07/20 1744 09/08/20 0159  HGB 7.5* 8.3*  --  7.7* 8.5*  --  8.0*  HCT 22.1* 24.0*  --  23.8* 25.1*  --  24.7*  PLT 538*  --   --  487*  --   --  505*  HEPARINUNFRC 0.11* 0.12*  --   --   --  0.17* 0.23*  CREATININE 3.73*  --   --  2.84*  --   --  1.94*  CKTOTAL 1,066*  --   --  803*  --   --  660*  TROPONINIHS  --   --  32* 33*  --   --   --     Estimated Creatinine Clearance: 49.7 mL/min (A) (by C-G formula based on SCr of 1.94 mg/dL (H)).   Medical History: Past Medical History:  Diagnosis Date  . AAA (abdominal aortic aneurysm) (HCC)   . History of open heart surgery   . Hypertension   . Seroma due to trauma [facility])     Assessment: 65 yr old male with hx Afib on apixaban 5 mg bid PTA  was admitted with bacteremia. Apixaban was on hold for possible procedures during acute illness and pt was started on IV heparin. IV heparin was briefly stopped on 1/10, with plans to transition back to apixaban, but IV heparin was resumed, as patient will need orthopedic intervention. Pt is S/P L leg debridement, fasciotomies today.   Heparin level continues to be below goal this afternoon. No bleeding issues noted. CBC stable overnight.   Goal of Therapy:  Heparin level 0.3-0.7 units/ml Monitor platelets by anticoagulation protocol: Yes   Plan:  Increase heparin infusion to 2100 units/hr Check heparin level in 8 hours Monitor daily HL, CBC, s/sx bleeding  3/10 PharmD.,  BCPS Clinical Pharmacist 09/08/2020 12:44 PM

## 2020-09-08 NOTE — Progress Notes (Signed)
PROGRESS NOTE  Rodrecus Belsky  DOB: 12-23-1955  PCP: Patient, No Pcp Per [facility]:[id number]  DOA: 08/30/2020  LOS: 9 days   Chief Complaint  Patient presents with  . Fever    Brief narrative: Deano Tomaszewski is a 65 y.o. male with PMH significant for HTN, CKD 3, A. 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  Patient presented to the ED on 1/4 with 1 week history of nausea, vomiting, chills followed by worsening left leg pain and swelling.  In the ED, he was noted to be in severe sepsis with WBC count elevated to 15, lactic acid 2, creatinine 4.2 with a baseline of 1.3, potassium low at 2.8.  CK level was elevated more than 28,000. Ultrasound rule out DVT.   CT scan of left leg showed soft tissue swelling but no muscle damage and no signs of abscess although limited due to lack of contrast and CT due to renal function.  MRI noted signs of myofascitis.   Patient was admitted to hospitalist service for cellulitis, rhabdomyolysis, AKI. Orthopedic and nephrology consultation were obtained. Blood cultures sent on admission grew Streptococcus group G, cellulitis likely being the source.    Over the course of hospitalization, patient had improvement in parameters for sepsis, rhabdomyolysis, AKI.  However his left leg continued to remain swollen and tender. Orthopedic consultation was obtained with Dr. Lajoyce Corners. 1/12, patient underwent left lower extremity AKA for necrotizing fasciitis  Subjective: Patient was seen and examined this morning.  Lying on bed.  Alert, awake, oriented x3.  Pain partially controlled.  Says he had a rough night because of pain. Hemodynamically stable.  Assessment/Plan: Necrotizing fasciitis of left leg -Initially presented as left leg cellulitis.   Also had evidence of myositis on MRI and rhabdomyolysis. -Sepsis parameters improved with antibiotics but left leg continues to remain swollen and tender. -Orthopedic consultation was obtained with Dr. Lajoyce Corners. 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 -Currently on gabapentin and Dilaudid 1 mg IV every 2 hours as needed for pain control..  May need adjustment as he is still due for repeat surgery tomorrow.  Bacteremia with Streptococcus group G -Left leg cellulitis/myositis probably the source.   -Currently on IV Rocephin.  Repeat blood culture from 1/8 did not show any growth.   -TEE 1/10 did not show any evidence of vegetation.  -ID consult appreciated.  Severe sepsis - POA -Secondary to cellulitis.  Sepsis resolved.   -WBC count slightly bumped today in response to surgery yesterday. Recent Labs  Lab 09/01/20 1026 09/01/20 1538 09/02/20 0416 09/03/20 0419 09/04/20 1446 09/05/20 0420 09/06/20 0441 09/07/20 0019 09/08/20 0159  WBC  --    < > 22.8* 25.8* 20.4* 18.9* 14.9* 14.5* 16.9*  LATICACIDVEN 0.9  --   --   --   --   --   --   --   --   PROCALCITON 43.41  --  34.97 20.62 7.47 5.24 2.99  --   --    < > = values in this interval not displayed.   Acute kidney injury -Baseline creatinine less than 1.4.  Patient with elevated creatinine of 4.2, peaked at 7.6 on 1/7.  AKI to be due to acute tubular injury  and decreased renal perfusion in the setting of rhabdomyolysis. Creatinine improving gradually.  Currently being monitored off fluid. Recent Labs    08/31/20 0414 08/31/20 1912 09/01/20 0532 09/01/20 1538 09/02/20 0416 09/03/20 0419 09/04/20 1446 09/05/20 0420 09/06/20 0441 09/07/20 0019 09/08/20 0159  BUN 54*  --  78* 84* 91* 99* 87* 79* 67* 56* 40*  CREATININE 4.56* 5.77* 6.51* 7.17* 7.61* 7.46* 5.79* 4.96* 3.73* 2.84* 1.94*   Rhabdomyolysis  Elevated transaminases -Secondary to  left leg myofascitis -CK level and transaminases level are trending down as below with IV fluid. -Currently off IV fluid Recent Labs  Lab 09/02/20 0416 09/03/20 0419 09/04/20 1446 09/05/20 0420 09/06/20 0441 09/07/20 0019 09/08/20 0159  CKTOTAL 26,712* 8,688* 3,244* 2,084* 1,066* 803* 660*   Recent Labs  Lab 09/01/20 1538 09/02/20 0416 09/04/20 1446 09/07/20 0019 09/08/20 0159  AST 193* 173* 75* 38 31  ALT 54* 54* 45* 31 28  ALKPHOS 77 84 80 73 68  BILITOT 2.8* 2.3* 1.3* 0.7 1.1  PROT 6.1* 6.4* 5.7* 6.1* 6.2*  ALBUMIN 2.2* 2.3* 1.9* 2.0* 2.1*   Acute anemia -Unclear cause. No active bleeding but hemoglobin is running low. -received a total of 3 units of PRBCs this hospital stay. -Hemoglobin further down to 8 this morning, probably due to surgical blood loss.  Continue to monitor. -Iron, ferritin level normal.  Probably not suggestive of chronic GI bleeding. Recent Labs    09/06/20 0441 09/06/20 2107 09/07/20 0019 09/07/20 1119 09/08/20 0159  HGB 7.5* 8.3* 7.7* 8.5* 8.0*  MCV 86.7  --  90.2  --  90.5  VITAMINB12 837  --   --   --   --   FOLATE 8.6  --   --   --   --   FERRITIN 1,090*  --   --   --   --   TIBC 202*  --   --   --   --   IRON 79  --   --   --   --   RETICCTPCT 3.2*  --   --   --   --    Essential hypertension Chronic diastolic dysfunction -Home meds include labetalol 200 mg twice daily, nifedipine 90 mg daily, Aldactone 25 mg daily, valsartan 80 mg daily -Meds initially held because of sepsis.  -Currently on labetalol 200 mg twice daily and nifedipine 90 mg daily.  Because of A. fib, cardiology and switched him from nifedipine to Cardizem this morning.  Continue to monitor -Echo from 1/6 with EF 60 to 65% and grade 2 diastolic dysfunction.  Persistent A. Fib -Patient has history of A. fib and DC cardioversion. -On admission, he was noted to be in A. fib and was started on amiodarone drip. Currently off amiodarone drip.  On labetalol.  Also added  Cardizem this morning. -Prior to admission, patient was on Eliquis for anticoagulation.  In the hospital, he has remained on heparin drip.  Continue the same.  Plan to reinitiate Eliquis prior to discharge.    History of type aortic arch dissection  -He is status post repair in 2016 and had dissection at anastomosis suture line seen December 2019.  Course was complicated by cardiogenic shock with EF of 15%.  Ultimately underwent total arch replacement in February 2020 while at [place].  -Continue aspirin and Eliquis. ?Not on a statin. -Lipid panel with HDL less than 10. Will discuss with cardiology about any specific statin/fibrate he will benefit from.  Chronic hypokalemia Hypomagnesemia -Potassium  level running low mostly between 3-3.5. -Magnesium level low at 1.4 today.  Replacement ordered. Recent Labs  Lab 09/04/20 1446 09/05/20 0420 09/06/20 0441 09/07/20 0019 09/08/20 0159  K 3.0* 3.1* 3.3* 3.5 3.6  MG  --   --   --   --  1.4*  PHOS  --   --   --   --  3.2   Hyperglycemia -No history of diabetes.  A1c 4.6 on 1/7. -Fingersticks consistently less than 150. Recent Labs  Lab 09/03/20 0745 09/04/20 0741 09/05/20 1509  GLUCAP 100* 104* 116*   Mobility: PT eval Code Status:   Code Status: Full Code  Nutritional status: Body mass index is 34.53 kg/m.     Diet Order            Diet Carb Modified Fluid consistency: Thin; Room service appropriate? Yes  Diet effective now                 DVT prophylaxis: SCDs Start: 09/07/20 1051 SCDs Start: 08/30/20 2133   Antimicrobials:  IV Rocephin Fluid: Not on IV fluid  Consultants: Nephrology, cardiology, ID, orthopedics Family Communication:  None at bedside  Status is: Inpatient  Remains inpatient appropriate because: Ongoing care for necrotizing fasciitis Dispo: The patient is from: Home              Anticipated d/c is to: Home likely              Anticipated d/c date is: > 3 days              Patient currently is  not medically stable to d/c.    Infusions:  . sodium chloride    . sodium chloride Stopped (09/07/20 1607)  . cefTRIAXone (ROCEPHIN)  IV Stopped (09/07/20 2240)  . heparin 1,900 Units/hr (09/08/20 0439)  . linezolid (ZYVOX) IV 600 mg (09/08/20 1008)    Scheduled Meds: . sodium chloride   Intravenous Once  . aspirin EC  81 mg Oral Daily  . diltiazem  30 mg Oral Q6H  . feeding supplement  237 mL Oral BID BM  . gabapentin  300 mg Oral TID  . labetalol  200 mg Oral BID  . mouth rinse  15 mL Mouth Rinse BID  . sodium chloride flush  10-40 mL Intracatheter Q12H    Antimicrobials: Anti-infectives (From admission, onward)   Start     Dose/Rate Route Frequency Ordered Stop   09/08/20 1000  linezolid (ZYVOX) IVPB 600 mg        600 mg 300 mL/hr over 60 Minutes Intravenous Every 12 hours 09/08/20 0838     09/07/20 1015  ceFAZolin (ANCEF) 3 g in dextrose 5 % 50 mL IVPB  Status:  Discontinued        3 g 100 mL/hr over 30 Minutes Intravenous On call to O.R. 09/07/20 1009 09/07/20 1043   09/07/20 0600  ceFAZolin (ANCEF) IVPB 2g/100 mL premix        2 g 200 mL/hr over 30 Minutes Intravenous On call to O.R. 09/06/20 1651 09/07/20 0855   09/02/20 2200  cefTRIAXone (ROCEPHIN) 2 g in sodium chloride 0.9 % 100 mL IVPB        2 g 200 mL/hr over 30 Minutes Intravenous Every 24 hours 09/02/20 1044     09/01/20 2200  vancomycin (VANCOREADY) IVPB 1500 mg/300 mL  Status:  Discontinued        1,500 mg 150 mL/hr over 120 Minutes Intravenous Every 48 hours 08/31/20  0350 08/31/20 1719   09/01/20 0130  ceFEPIme (MAXIPIME) 2 g in sodium chloride 0.9 % 100 mL IVPB  Status:  Discontinued        2 g 200 mL/hr over 30 Minutes Intravenous Every 24 hours 08/31/20 1812 09/02/20 1043   09/01/20 0100  metroNIDAZOLE (FLAGYL) IVPB 500 mg  Status:  Discontinued        500 mg 100 mL/hr over 60 Minutes Intravenous Every 8 hours 08/31/20 1751 08/31/20 1926   08/31/20 2200  ceFEPIme (MAXIPIME) 2 g in sodium chloride  0.9 % 100 mL IVPB  Status:  Discontinued        2 g 200 mL/hr over 30 Minutes Intravenous Every 24 hours 08/31/20 0350 08/31/20 1719   08/31/20 2030  vancomycin (VANCOREADY) IVPB 1500 mg/300 mL        1,500 mg 150 mL/hr over 120 Minutes Intravenous  Once 08/31/20 2016 08/31/20 2338   08/31/20 2015  clindamycin (CLEOCIN) IVPB 900 mg  Status:  Discontinued        900 mg 100 mL/hr over 30 Minutes Intravenous Every 8 hours 08/31/20 1926 09/01/20 1033   08/31/20 1812  vancomycin variable dose per unstable renal function (pharmacist dosing)  Status:  Discontinued         Does not apply See admin instructions 08/31/20 1812 09/01/20 1033   08/31/20 1730  cefTRIAXone (ROCEPHIN) 2 g in sodium chloride 0.9 % 100 mL IVPB  Status:  Discontinued        2 g 200 mL/hr over 30 Minutes Intravenous Every 24 hours 08/31/20 1720 08/31/20 1751   08/31/20 1015  metroNIDAZOLE (FLAGYL) IVPB 500 mg  Status:  Discontinued        500 mg 100 mL/hr over 60 Minutes Intravenous Every 8 hours 08/31/20 1012 08/31/20 1719   08/31/20 0045  ceFEPIme (MAXIPIME) 2 g in sodium chloride 0.9 % 100 mL IVPB        2 g 200 mL/hr over 30 Minutes Intravenous  Once 08/31/20 0030 08/31/20 0208   08/31/20 0000  vancomycin (VANCOREADY) IVPB 1500 mg/300 mL        1,500 mg 150 mL/hr over 120 Minutes Intravenous  Once 08/30/20 2351 08/31/20 0208   08/30/20 1900  cefTRIAXone (ROCEPHIN) 2 g in sodium chloride 0.9 % 100 mL IVPB  Status:  Discontinued        2 g 200 mL/hr over 30 Minutes Intravenous Every 24 hours 08/30/20 1855 08/31/20 0030      PRN meds: hydrALAZINE, HYDROmorphone (DILAUDID) injection, metoCLOPramide **OR** metoCLOPramide (REGLAN) injection, ondansetron **OR** ondansetron (ZOFRAN) IV, oxyCODONE, sodium chloride flush   Objective: Vitals:   09/08/20 0850 09/08/20 1000  BP: (!) 147/99 (!) 132/94  Pulse: 99 (!) 105  Resp: 20   Temp: 98.4 F (36.9 C)   SpO2: 96%     Intake/Output Summary (Last 24 hours) at 09/08/2020  1010 Last data filed at 09/08/2020 1010 Gross per 24 hour  Intake 1320 ml  Output 2950 ml  Net -1630 ml   Filed Weights   09/03/20 1500 09/07/20 0556 09/08/20 0405  Weight: 112.4 kg 120.8 kg 112.3 kg   Weight change: -8.489 kg Body mass index is 34.53 kg/m.   Physical Exam: General exam: Pleasant middle-aged African-American male.  Partially controlled pain Skin: No rashes, lesions or ulcers. HEENT: Atraumatic, normocephalic, no obvious bleeding Lungs: Clear to auscultation bilaterally CVS: Regular rate and rhythm, no murmur GI/Abd soft, nontender, nondistended, bowel sound present CNS: Somnolent postprocedure.  Opens eyes  on verbal command. Psychiatry: Depressed look Extremities: Left AKA status.  Wound VAC attached to stump.  Data Review: I have personally reviewed the laboratory data and studies available.  Recent Labs  Lab 09/04/20 1446 09/05/20 0420 09/06/20 0441 09/06/20 2107 09/07/20 0019 09/07/20 1119 09/08/20 0159  WBC 20.4* 18.9* 14.9*  --  14.5*  --  16.9*  NEUTROABS  --   --   --   --   --   --  12.1*  HGB 7.1* 6.8* 7.5* 8.3* 7.7* 8.5* 8.0*  HCT 19.6* 19.0* 22.1* 24.0* 23.8* 25.1* 24.7*  MCV 83.1 83.0 86.7  --  90.2  --  90.5  PLT 510* 530* 538*  --  487*  --  505*   Recent Labs  Lab 09/04/20 1446 09/05/20 0420 09/06/20 0441 09/07/20 0019 09/08/20 0159  NA 142 144 144 144 145  K 3.0* 3.1* 3.3* 3.5 3.6  CL 107 110 109 109 110  CO2 21* 23 23 23 24   GLUCOSE 124* 114* 99 97 116*  BUN 87* 79* 67* 56* 40*  CREATININE 5.79* 4.96* 3.73* 2.84* 1.94*  CALCIUM 7.6* 7.7* 7.8* 7.8* 7.8*  MG  --   --   --   --  1.4*  PHOS  --   --   --   --  3.2    F/u labs ordered  Signed, Lorin GlassBinaya Ezekiah Massie, MD [facility] 09/08/2020

## 2020-09-09 ENCOUNTER — Inpatient Hospital Stay (HOSPITAL_COMMUNITY): Payer: Medicaid Other | Admitting: Anesthesiology

## 2020-09-09 ENCOUNTER — Encounter (HOSPITAL_COMMUNITY): Payer: Self-pay | Admitting: Internal Medicine

## 2020-09-09 ENCOUNTER — Encounter (HOSPITAL_COMMUNITY)
Admission: EM | Disposition: A | Discharge status: Rehabilitation Facility | Payer: Self-pay | Source: Home / Self Care | Attending: Internal Medicine

## 2020-09-09 DIAGNOSIS — N1831 Chronic kidney disease, stage 3a: Secondary | ICD-10-CM | POA: Diagnosis not present

## 2020-09-09 DIAGNOSIS — A409 Streptococcal sepsis, unspecified: Secondary | ICD-10-CM | POA: Diagnosis not present

## 2020-09-09 DIAGNOSIS — L03116 Cellulitis of left lower limb: Secondary | ICD-10-CM | POA: Diagnosis not present

## 2020-09-09 DIAGNOSIS — R6520 Severe sepsis without septic shock: Secondary | ICD-10-CM | POA: Diagnosis not present

## 2020-09-09 DIAGNOSIS — B950 Streptococcus, group A, as the cause of diseases classified elsewhere: Secondary | ICD-10-CM

## 2020-09-09 DIAGNOSIS — N179 Acute kidney failure, unspecified: Secondary | ICD-10-CM | POA: Diagnosis not present

## 2020-09-09 HISTORY — PX: STUMP REVISION: SHX6102

## 2020-09-09 LAB — CBC WITH DIFFERENTIAL/PLATELET
Abs Immature Granulocytes: 0.9 10*3/uL — ABNORMAL HIGH (ref 0.00–0.07)
Basophils Absolute: 0 10*3/uL (ref 0.0–0.1)
Basophils Relative: 0 %
Eosinophils Absolute: 0.4 10*3/uL (ref 0.0–0.5)
Eosinophils Relative: 2 %
HCT: 22.4 % — ABNORMAL LOW (ref 39.0–52.0)
Hemoglobin: 6.9 g/dL — CL (ref 13.0–17.0)
Immature Granulocytes: 6 %
Lymphocytes Relative: 13 %
Lymphs Abs: 2.1 10*3/uL (ref 0.7–4.0)
MCH: 29.2 pg (ref 26.0–34.0)
MCHC: 30.8 g/dL (ref 30.0–36.0)
MCV: 94.9 fL (ref 80.0–100.0)
Monocytes Absolute: 1.3 10*3/uL — ABNORMAL HIGH (ref 0.1–1.0)
Monocytes Relative: 8 %
Neutro Abs: 11.7 10*3/uL — ABNORMAL HIGH (ref 1.7–7.7)
Neutrophils Relative %: 71 %
Platelets: 462 10*3/uL — ABNORMAL HIGH (ref 150–400)
RBC: 2.36 MIL/uL — ABNORMAL LOW (ref 4.22–5.81)
RDW: 17.1 % — ABNORMAL HIGH (ref 11.5–15.5)
WBC: 16.3 10*3/uL — ABNORMAL HIGH (ref 4.0–10.5)
nRBC: 0.6 % — ABNORMAL HIGH (ref 0.0–0.2)

## 2020-09-09 LAB — POCT I-STAT EG7
Acid-Base Excess: 4 mmol/L — ABNORMAL HIGH (ref 0.0–2.0)
Bicarbonate: 30 mmol/L — ABNORMAL HIGH (ref 20.0–28.0)
Calcium, Ion: 1.14 mmol/L — ABNORMAL LOW (ref 1.15–1.40)
HCT: 23 % — ABNORMAL LOW (ref 39.0–52.0)
Hemoglobin: 7.8 g/dL — ABNORMAL LOW (ref 13.0–17.0)
O2 Saturation: 79 %
Patient temperature: 36.7
Potassium: 5.3 mmol/L — ABNORMAL HIGH (ref 3.5–5.1)
Sodium: 145 mmol/L (ref 135–145)
TCO2: 32 mmol/L (ref 22–32)
pCO2, Ven: 50 mmHg (ref 44.0–60.0)
pH, Ven: 7.385 (ref 7.250–7.430)
pO2, Ven: 44 mmHg (ref 32.0–45.0)

## 2020-09-09 LAB — HEPATIC FUNCTION PANEL
ALT: 21 U/L (ref 0–44)
AST: 31 U/L (ref 15–41)
Albumin: 2 g/dL — ABNORMAL LOW (ref 3.5–5.0)
Alkaline Phosphatase: 64 U/L (ref 38–126)
Bilirubin, Direct: 0.3 mg/dL — ABNORMAL HIGH (ref 0.0–0.2)
Indirect Bilirubin: 0.6 mg/dL (ref 0.3–0.9)
Total Bilirubin: 0.9 mg/dL (ref 0.3–1.2)
Total Protein: 5.9 g/dL — ABNORMAL LOW (ref 6.5–8.1)

## 2020-09-09 LAB — BASIC METABOLIC PANEL
Anion gap: 13 (ref 5–15)
BUN: 29 mg/dL — ABNORMAL HIGH (ref 8–23)
CO2: 21 mmol/L — ABNORMAL LOW (ref 22–32)
Calcium: 7.7 mg/dL — ABNORMAL LOW (ref 8.9–10.3)
Chloride: 109 mmol/L (ref 98–111)
Creatinine, Ser: 1.86 mg/dL — ABNORMAL HIGH (ref 0.61–1.24)
GFR, Estimated: 40 mL/min — ABNORMAL LOW (ref >60)
Glucose, Bld: 83 mg/dL (ref 70–99)
Potassium: 4.6 mmol/L (ref 3.5–5.1)
Sodium: 143 mmol/L (ref 135–145)

## 2020-09-09 LAB — SURGICAL PCR SCREEN
MRSA, PCR: NEGATIVE
Staphylococcus aureus: NEGATIVE

## 2020-09-09 LAB — GLUCOSE, CAPILLARY: Glucose-Capillary: 112 mg/dL — ABNORMAL HIGH (ref 70–99)

## 2020-09-09 LAB — CK: Total CK: 428 U/L — ABNORMAL HIGH (ref 49–397)

## 2020-09-09 LAB — PREPARE RBC (CROSSMATCH)

## 2020-09-09 LAB — HEPARIN LEVEL (UNFRACTIONATED): Heparin Unfractionated: 0.21 IU/mL — ABNORMAL LOW (ref 0.30–0.70)

## 2020-09-09 SURGERY — REVISION, AMPUTATION SITE
Anesthesia: General | Site: Leg Upper | Laterality: Left

## 2020-09-09 MED ORDER — ONDANSETRON HCL 4 MG/2ML IJ SOLN
INTRAMUSCULAR | Status: DC | PRN
  Administered 2020-09-09: 4 mg via INTRAVENOUS

## 2020-09-09 MED ORDER — OXYCODONE HCL 5 MG PO TABS: 5.0000 mg | ORAL_TABLET | Freq: Once | ORAL | Status: DC | PRN

## 2020-09-09 MED ORDER — DEXAMETHASONE SODIUM PHOSPHATE 10 MG/ML IJ SOLN
INTRAMUSCULAR | Status: DC | PRN
  Administered 2020-09-09: 5 mg via INTRAVENOUS

## 2020-09-09 MED ORDER — PROPOFOL 10 MG/ML IV BOLUS
INTRAVENOUS | Status: AC
  Filled 2020-09-09: qty 60

## 2020-09-09 MED ORDER — FENTANYL CITRATE (PF) 250 MCG/5ML IJ SOLN
INTRAMUSCULAR | Status: AC
  Filled 2020-09-09: qty 5

## 2020-09-09 MED ORDER — OXYCODONE HCL 5 MG/5ML PO SOLN: 5.0000 mg | Freq: Once | ORAL | Status: DC | PRN

## 2020-09-09 MED ORDER — PROPOFOL 10 MG/ML IV BOLUS
INTRAVENOUS | Status: DC | PRN
  Administered 2020-09-09: 120 mg via INTRAVENOUS

## 2020-09-09 MED ORDER — SUCCINYLCHOLINE CHLORIDE 200 MG/10ML IV SOSY
PREFILLED_SYRINGE | INTRAVENOUS | Status: DC | PRN
  Administered 2020-09-09: 120 mg via INTRAVENOUS

## 2020-09-09 MED ORDER — MIDAZOLAM HCL 2 MG/2ML IJ SOLN
INTRAMUSCULAR | Status: AC
  Filled 2020-09-09: qty 2

## 2020-09-09 MED ORDER — CEFAZOLIN SODIUM-DEXTROSE 2-4 GM/100ML-% IV SOLN
2.0000 g | INTRAVENOUS | Status: AC
  Administered 2020-09-09: 2 g via INTRAVENOUS
  Filled 2020-09-09: qty 100

## 2020-09-09 MED ORDER — LIDOCAINE 2% (20 MG/ML) 5 ML SYRINGE
INTRAMUSCULAR | Status: DC | PRN
  Administered 2020-09-09: 60 mg via INTRAVENOUS

## 2020-09-09 MED ORDER — HYDROMORPHONE HCL 1 MG/ML IJ SOLN
INTRAMUSCULAR | Status: AC
  Administered 2020-09-09: 0.5 mg via INTRAVENOUS
  Filled 2020-09-09: qty 1

## 2020-09-09 MED ORDER — SODIUM CHLORIDE 0.9 % IV SOLN: INTRAVENOUS | Status: DC

## 2020-09-09 MED ORDER — SODIUM CHLORIDE 0.9 % IV SOLN: 10.0000 mL/h | Freq: Once | INTRAVENOUS | Status: DC

## 2020-09-09 MED ORDER — 0.9 % SODIUM CHLORIDE (POUR BTL) OPTIME
TOPICAL | Status: DC | PRN
  Administered 2020-09-09: 1000 mL

## 2020-09-09 MED ORDER — FENTANYL CITRATE (PF) 250 MCG/5ML IJ SOLN
INTRAMUSCULAR | Status: DC | PRN
  Administered 2020-09-09 (×3): 50 ug via INTRAVENOUS
  Administered 2020-09-09: 25 ug via INTRAVENOUS

## 2020-09-09 MED ORDER — PROMETHAZINE HCL 25 MG/ML IJ SOLN: 6.2500 mg | INTRAMUSCULAR | Status: DC | PRN

## 2020-09-09 MED ORDER — HYDROMORPHONE HCL 1 MG/ML IJ SOLN
0.2500 mg | INTRAMUSCULAR | Status: DC | PRN
  Administered 2020-09-09: 0.5 mg via INTRAVENOUS

## 2020-09-09 MED ORDER — ACETAMINOPHEN 500 MG PO TABS
1000.0000 mg | ORAL_TABLET | Freq: Once | ORAL | Status: AC
  Administered 2020-09-09: 1000 mg via ORAL
  Filled 2020-09-09: qty 2

## 2020-09-09 MED ORDER — PROPOFOL 10 MG/ML IV BOLUS
INTRAVENOUS | Status: AC
  Filled 2020-09-09: qty 20

## 2020-09-09 MED ORDER — LACTATED RINGERS IV SOLN: INTRAVENOUS | Status: DC | PRN

## 2020-09-09 SURGICAL SUPPLY — 35 items
BLADE SAW RECIP 87.9 MT (BLADE) ×2 IMPLANT
BLADE SURG 21 STRL SS (BLADE) ×3 IMPLANT
CANISTER WOUND CARE 500ML ATS (WOUND CARE) ×3 IMPLANT
CASSETTE VERAFLO VERALINK (MISCELLANEOUS) IMPLANT
COVER SURGICAL LIGHT HANDLE (MISCELLANEOUS) ×3 IMPLANT
COVER WAND RF STERILE (DRAPES) ×3 IMPLANT
DRAPE DERMATAC (DRAPES) ×4 IMPLANT
DRAPE EXTREMITY T 121X128X90 (DISPOSABLE) ×3 IMPLANT
DRAPE HALF SHEET 40X57 (DRAPES) ×3 IMPLANT
DRAPE INCISE IOBAN 66X45 STRL (DRAPES) ×3 IMPLANT
DRAPE U-SHAPE 47X51 STRL (DRAPES) ×6 IMPLANT
DRESSING PREVENA PLUS CUSTOM (GAUZE/BANDAGES/DRESSINGS) ×1 IMPLANT
DRSG PREVENA PLUS CUSTOM (GAUZE/BANDAGES/DRESSINGS) ×3
DRSG VERAFLO VAC MED (GAUZE/BANDAGES/DRESSINGS) IMPLANT
DURAPREP 26ML APPLICATOR (WOUND CARE) ×3 IMPLANT
ELECT REM PT RETURN 9FT ADLT (ELECTROSURGICAL) ×3
ELECTRODE REM PT RTRN 9FT ADLT (ELECTROSURGICAL) ×1 IMPLANT
GLOVE BIOGEL PI IND STRL 9 (GLOVE) ×1 IMPLANT
GLOVE BIOGEL PI INDICATOR 9 (GLOVE) ×2
GLOVE SURG ORTHO 9.0 STRL STRW (GLOVE) ×3 IMPLANT
GOWN STRL REUS W/ TWL XL LVL3 (GOWN DISPOSABLE) ×2 IMPLANT
GOWN STRL REUS W/TWL XL LVL3 (GOWN DISPOSABLE) ×6
KIT BASIN OR (CUSTOM PROCEDURE TRAY) ×3 IMPLANT
KIT TURNOVER KIT B (KITS) ×3 IMPLANT
MANIFOLD NEPTUNE II (INSTRUMENTS) ×3 IMPLANT
NS IRRIG 1000ML POUR BTL (IV SOLUTION) ×3 IMPLANT
PACK GENERAL/GYN (CUSTOM PROCEDURE TRAY) ×3 IMPLANT
PAD ARMBOARD 7.5X6 YLW CONV (MISCELLANEOUS) ×3 IMPLANT
PREVENA RESTOR ARTHOFORM 46X30 (CANNISTER) ×3 IMPLANT
STAPLER VISISTAT 35W (STAPLE) ×2 IMPLANT
SUT ETHILON 2 0 PSLX (SUTURE) ×6 IMPLANT
SUT SILK 2 0 (SUTURE)
SUT SILK 2-0 18XBRD TIE 12 (SUTURE) IMPLANT
TOWEL GREEN STERILE (TOWEL DISPOSABLE) ×3 IMPLANT
YANKAUER SUCT BULB TIP NO VENT (SUCTIONS) ×2 IMPLANT

## 2020-09-09 NOTE — Op Note (Signed)
09/09/2020  11:25 AM  PATIENT:  Connor [name]    PRE-OPERATIVE DIAGNOSIS:  Necrotizing Fasciitis Left Leg  POST-OPERATIVE DIAGNOSIS:  Same  PROCEDURE:  REVISION LEFT ABOVE KNEE AMPUTATION Application of Prevena customizable and Arthur form wound VAC  SURGEON:  Nadara Mustard, MD  PHYSICIAN ASSISTANT:None ANESTHESIA:   General  PREOPERATIVE INDICATIONS:  Connor [name] is a  65 y.o. male with a diagnosis of Necrotizing Fasciitis Left Leg who failed conservative measures and elected for surgical management.    The risks benefits and alternatives were discussed with the patient preoperatively including but not limited to the risks of infection, bleeding, nerve injury, cardiopulmonary complications, the need for revision surgery, among others, and the patient was willing to proceed.  OPERATIVE IMPLANTS: Praveena customizable and [place] form wound VAC  Patient received 1 unit of packed red blood cells preoperatively.  @ENCIMAGES @  OPERATIVE FINDINGS: All margins were healthy and viable with good contractile muscle.  OPERATIVE PROCEDURE: Patient was brought the operating room and underwent general anesthetic.  After adequate levels anesthesia were obtained patient's left lower extremity was prepped using DuraPrep draped into a sterile field a timeout.  The sponges and wound was opened.  The wound margins were grossly viable.  A 21 blade knife was used to circumferentially excise skin and soft tissue muscle and fascia down to healthy viable contractile muscle with good color and consistency.  Electrocautery was used hemostasis the wound was irrigated with normal saline.  The distal centimeter of the femur was resected to revise the amputation.  The deep and superficial fascia layers and skin was closed using 2-0 nylon staples were used to reinforce the wound.  A Praveena customizable and Ortho form wound VAC was applied this had a good suction fit patient was extubated taken the PACU in stable  condition   DISCHARGE PLANNING:  Antibiotic duration: Continue IV antibiotics as per infectious disease.  Weightbearing: Nonweightbearing on the left  Pain medication: Opioid pathway  Dressing care/ Wound VAC: Continue wound VAC for 1 week  Ambulatory devices: Walker  Discharge to: Anticipate discharge to skilled nursing.  Follow-up: In the office 1 week post operative.

## 2020-09-09 NOTE — Progress Notes (Signed)
ANTICOAGULATION CONSULT NOTE - Follow-Up Consult  Pharmacy Consult for IV Heparin Indication: atrial fibrillation  No Known Allergies  Patient Measurements: Height: 5\' 11"  (180.3 cm) Weight: 112.3 kg (247 lb 9.6 oz) IBW/kg (Calculated) : 75.3 Heparin Dosing Weight: 100 kg  Vital Signs: Temp: 98.2 F (36.8 C) (01/14 0029) Temp Source: Oral (01/14 0029) BP: 134/82 (01/14 0029) Pulse Rate: 82 (01/14 0029)  Labs: Recent Labs    09/06/20 0441 09/06/20 2107 09/06/20 2147 09/07/20 0019 09/07/20 1119 09/07/20 1744 09/08/20 0159 09/08/20 1216 09/09/20 0108  HGB 7.5*   < >  --  7.7* 8.5*  --  8.0*  --   --   HCT 22.1*   < >  --  23.8* 25.1*  --  24.7*  --   --   PLT 538*  --   --  487*  --   --  505*  --   --   HEPARINUNFRC 0.11*   < >  --   --   --    < > 0.23* 0.24* 0.21*  CREATININE 3.73*  --   --  2.84*  --   --  1.94*  --  1.86*  CKTOTAL 1,066*  --   --  803*  --   --  660*  --  428*  TROPONINIHS  --   --  32* 33*  --   --   --   --   --    < > = values in this interval not displayed.    Estimated Creatinine Clearance: 51.8 mL/min (A) (by C-G formula based on SCr of 1.86 mg/dL (H)).   Medical History: Past Medical History:  Diagnosis Date  . AAA (abdominal aortic aneurysm) (HCC)   . History of open heart surgery   . Hypertension   . Seroma due to trauma [facility])     Assessment: 65 yr old male with hx Afib on apixaban 5 mg bid PTA  was admitted with bacteremia. Apixaban was on hold for possible procedures during acute illness and pt was started on IV heparin. IV heparin was briefly stopped on 1/10, with plans to transition back to apixaban, but IV heparin was resumed, as patient will need orthopedic intervention. Pt is S/P L leg debridement, fasciotomies today. Per RN, heparin was stopped for surgery and resumed at 1550 units/hr at ~1100 AM today.  Heparin level ~6 hrs after heparin was resumed post op at 1550 units/hr was 0.17 units/ml, which is below the goal range  for this pt. H/H 8.5/25.1, plt 487. Per RN, no issues with IV or bleeding observed.  1/14 AM update:  Heparin level below goal  No issues per RN  Goal of Therapy:  Heparin level 0.3-0.7 units/ml Monitor platelets by anticoagulation protocol: Yes   Plan:  Increase heparin infusion to 2350 units/hr Check heparin level in 8 hours Monitor daily HL, CBC, s/sx bleeding  2/14, PharmD, BCPS Clinical Pharmacist Phone: [phone]

## 2020-09-09 NOTE — Progress Notes (Signed)
Inpatient Rehab Admissions Coordinator:   PT initial recommendations for CIR.  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  Will follow for post-op therapy recommendations before screening.   Estill Dooms, PT, DPT Admissions Coordinator [phone] 09/09/20  2:18 PM

## 2020-09-09 NOTE — Anesthesia Procedure Notes (Signed)
Procedure Name: Intubation Date/Time: 09/09/2020 10:29 AM Performed by: Colin Benton, CRNA Pre-anesthesia Checklist: Patient identified, Emergency Drugs available, Suction available and Patient being monitored Patient Re-evaluated:Patient Re-evaluated prior to induction Oxygen Delivery Method: Circle System Utilized Preoxygenation: Pre-oxygenation with 100% oxygen Induction Type: IV induction Ventilation: Mask ventilation without difficulty Laryngoscope Size: Mac and 4 Grade View: Grade I Tube type: Oral Tube size: 7.5 mm Number of attempts: 1 Airway Equipment and Method: Stylet and Oral airway Placement Confirmation: ETT inserted through vocal cords under direct vision,  positive ETCO2 and breath sounds checked- equal and bilateral Secured at: 22 cm Tube secured with: Tape Dental Injury: Teeth and Oropharynx as per pre-operative assessment

## 2020-09-09 NOTE — Progress Notes (Signed)
PROGRESS NOTE    Connor [name]  WER:[id number] DOB: [birth year] DOA: 08/30/2020 PCP: Patient, No Pcp Per   Brief Narrative:  Connor [name] is a 65 y.o. male with PMH significant for HTN, CKD 3, A. 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  Patient presented to the ED on 1/4 with 1 week history of nausea, vomiting, chills followed by worsening left leg pain and swelling.  In the ED, he was noted to be in severe sepsis with WBC count elevated to 15, lactic acid 2, creatinine 4.2 with a baseline of 1.3, potassium low at 2.8.  CK level was elevated more than 28,000. Ultrasound rule out DVT.   CT scan of left leg showed soft tissue swelling but no muscle damage and no signs of abscess although limited due to lack of contrast and CT due to renal function.  MRI noted signs of myofascitis.  Patient was admitted to hospitalist service for cellulitis, rhabdomyolysis, AKI. Orthopedic and nephrology consultation were obtained. Blood cultures sent on admission grew Streptococcus group G, cellulitis likely being the source.    Over the course of hospitalization, patient had improvement in parameters for sepsis, rhabdomyolysis, AKI.  However his left leg continued to remain swollen and tender. Orthopedic consultation was obtained with Dr. Sharol Given. 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    Assessment & Plan:   Principal Problem:   Severe sepsis with acute organ dysfunction due to Streptococcus species [facility]) Active Problems:   Persistent atrial fibrillation (HCC)   Hypertension   Cellulitis of left leg   Lactic acidosis   Elevated troponin   Hypoalbuminemia   Leukocytosis   Thrombocytopenia  (HCC)   Hypokalemia   Hyperglycemia   Transaminitis   Total bilirubin, elevated   AKI (acute kidney injury) (HCC)   Nausea vomiting and diarrhea   CKD (chronic kidney disease), stage III (HCC)   Morbid obesity (HCC)   Sepsis with acute renal failure without septic shock ([place])   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)  Necrotizing fasciitis of left leg -Initially presented as left leg cellulitis.  Also had evidence of myositis on MRI and rhabdomyolysis. -Sepsis parameters improved with antibiotics but left leg continues to remain swollen and tender. -Orthopedic consultation was obtained with Dr. Sharol Given. 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 -Currently on gabapentin and Dilaudid 1 mg IV every 2 hours as needed for pain control..  May need adjustment -Underwent Revision of AKA today   Bacteremia with Streptococcus group G -Left leg cellulitis/myositis probably the source.   -Currently on IV Rocephin.  Repeat blood culture from 1/8 did not show any growth.   -TEE 1/10 did not show any evidence of vegetation.  -ID consult appreciated.  Severe sepsis - POA -Secondary to cellulitis.  Sepsis resolved.   -WBC count slightly bumped yesterdayin response to surgery and is now down to 16.3 Last Labs              Recent Labs  Lab 09/01/20 1026 09/01/20 1538 09/02/20 0416 09/03/20 0419 09/04/20 1446 09/05/20 0420 09/06/20 0441 09/07/20 0019 09/08/20 0159  WBC  --    < > 22.8*  25.8* 20.4* 18.9* 14.9* 14.5* 16.9*  LATICACIDVEN 0.9  --   --   --   --   --   --   --   --   PROCALCITON 43.41  --  34.97 20.62 7.47 5.24 2.99  --   --    < > = values in this interval not displayed.     Acute kidney injury -Baseline creatinine less than 1.4.  Patient with elevated creatinine of 4.2, peaked at 7.6 on 1/7.  AKI to be due to acute tubular injury and decreased renal perfusion in the  setting of rhabdomyolysis. Creatinine improving gradually.  Currently being monitored off fluid. -BUN/Cr is improving and is now 29/1.86  Rhabdomyolysis  Elevated transaminases -Secondary to left leg myofascitis -CK level and transaminases level are trending down as below with IV fluid. -Currently off IV fluid Last Labs            Recent Labs  Lab 09/02/20 0416 09/03/20 0419 09/04/20 1446 09/05/20 0420 09/06/20 0441 09/07/20 0019 09/08/20 0159  CKTOTAL 16,946* 8,688* 3,244* 2,084* 1,066* 803* 660*     Hepatic Function Latest Ref Rng & Units 09/09/2020 09/08/2020 09/07/2020  Total Protein 6.5 - 8.1 g/dL 5.9(L) 6.2(L) 6.1(L)  Albumin 3.5 - 5.0 g/dL 2.0(L) 2.1(L) 2.0(L)  AST 15 - 41 U/L 31 31 38  ALT 0 - 44 U/L _0 Alk Phosphatase 38 - 126 U/L 64 68 73  Total Bilirubin 0.3 - 1.2 mg/dL 0.9 1.1 0.7  Bilirubin, Direct 0.0 - 0.2 mg/dL 0.3(H) 0.3(H) 0.3(H)   Acute anemia -Unclear cause. No active bleeding but hemoglobin is running low. -received a total of 4 units of PRBCs this hospital stay will get 1 toady) -Hemoglobin further down to 8 yesterday morning, probably due to surgical blood loss.  Continue to monitor. -Iron, ferritin level normal.  Probably not suggestive of chronic GI bleeding. -Hb/Hct went from 8.0/24.7 -> 6.9/22.4 -> 7.8/23.0 after received another this AM -Continue to Monitor for S/Sx of Bleeding as he has been on Heparin gtt but has been held -Repeat CBC in the MA   Essential hypertension Chronic diastolic dysfunction -Home meds include labetalol 200 mg twice daily, nifedipine 90 mg daily, Aldactone 25 mg daily, valsartan 80 mg daily -Meds initially held because of sepsis.  -Currently on labetalol 200 mg twice daily and nifedipine 90 mg daily.  Because of A. fib, cardiology and switched him from nifedipine to Cardizem this morning.  Continue to monitor -Echo from 1/6 with EF 60 to 65% and grade 2 diastolic dysfunction.  Persistent A. Fib -Patient has  history of A. fib and DC cardioversion. -On admission, he was noted to be in A. fib and was started on amiodarone drip. Currently off amiodarone drip.  On labetalol.  Also added Cardizem and is on 30 mg q6h -Prior to admission, patient was on Eliquis for anticoagulation.  In the hospital, he has remained on heparin drip but held due to anemia. .  Plan to reinitiate Eliquis prior to discharge.   -Continue to Monitor on Telemetry   History of type aortic arch dissection  -He is status post repair in 2016 and had dissection at anastomosis suture line seen December 2019.  Course was complicated by cardiogenic shock with EF of 15%.  Ultimately underwent total arch replacement in February 2020 while at [place].  -Continue aspirin and Eliquis but was on Heparin gtt which has been held given Anemia. ?Not on a statin. -Lipid panel with  HDL less than 10. Will discuss with cardiology about any specific statin/fibrate he will benefit from.  Chronic hypokalemia Hypomagnesemia -Potassium level running low mostly between 3-3.5. -Magnesium level low at 1.4 Yesterday .  Replacement ordered. -K+ is now 4.6 -> 5.3 -Mag not repeated today -Continue to Monitor and Trend  -Repeat CMP and Mag in the AM   Hyperglycemia -No history of diabetes.  A1c 4.6 on 1/7. -Fingersticks consistently less than 150. Recent Labs    09/09/20 1120  GLUCAP 112*   Obesity -Complicates overall prognosis and care -Estimated body mass index is 33.7 kg/m as calculated from the following:   Height as of this encounter: _0  (1.803 m).   Weight as of this encounter: 109.6 kg. -Weight Loss and Dietary Counseling given   DVT prophylaxis: SCDs, Will discuss with Ortho about resuming Heparin gtt  Code Status: FULL CODE  Family Communication: No family present at bedside  Disposition Plan: CIR; Has declined SNF  Status is: Inpatient  Remains inpatient appropriate because:Unsafe d/c plan, IV treatments appropriate due to  intensity of illness or inability to take PO and Inpatient level of care appropriate due to severity of illness   Dispo: The patient is from: Home              Anticipated d/c is to: CIR              Anticipated d/c date is: 3 days              Patient currently is not medically stable to d/c.   Consultants:   Nephrology  Cardiology  Infectious Diseases  Orthopedic Surgery  General Surgery    Procedures:  REVISION LEFT ABOVE KNEE AMPUTATION on 7/62/83 Application of Prevena customizable and Arthur form wound VAC  Antimicrobials:  Anti-infectives (From admission, onward)   Start     Dose/Rate Route Frequency Ordered Stop   09/09/20 0600  ceFAZolin (ANCEF) IVPB 2g/100 mL premix        2 g 200 mL/hr over 30 Minutes Intravenous To Short Stay 09/09/20 0213 09/09/20 1103   09/08/20 1000  linezolid (ZYVOX) IVPB 600 mg        600 mg 300 mL/hr over 60 Minutes Intravenous Every 12 hours 09/08/20 0838     09/07/20 1015  ceFAZolin (ANCEF) 3 g in dextrose 5 % 50 mL IVPB  Status:  Discontinued        3 g 100 mL/hr over 30 Minutes Intravenous On call to O.R. 09/07/20 1009 09/07/20 1043   09/07/20 0600  ceFAZolin (ANCEF) IVPB 2g/100 mL premix        2 g 200 mL/hr over 30 Minutes Intravenous On call to O.R. 09/06/20 1651 09/07/20 0855   09/02/20 2200  cefTRIAXone (ROCEPHIN) 2 g in sodium chloride 0.9 % 100 mL IVPB        2 g 200 mL/hr over 30 Minutes Intravenous Every 24 hours 09/02/20 1044     09/01/20 2200  vancomycin (VANCOREADY) IVPB 1500 mg/300 mL  Status:  Discontinued        1,500 mg 150 mL/hr over 120 Minutes Intravenous Every 48 hours 08/31/20 0350 08/31/20 1719   09/01/20 0130  ceFEPIme (MAXIPIME) 2 g in sodium chloride 0.9 % 100 mL IVPB  Status:  Discontinued        2 g 200 mL/hr over 30 Minutes Intravenous Every 24 hours 08/31/20 1812 09/02/20 1043   09/01/20 0100  metroNIDAZOLE (FLAGYL) IVPB 500 mg  Status:  Discontinued  500 mg 100 mL/hr over 60 Minutes  Intravenous Every 8 hours 08/31/20 1751 08/31/20 1926   08/31/20 2200  ceFEPIme (MAXIPIME) 2 g in sodium chloride 0.9 % 100 mL IVPB  Status:  Discontinued        2 g 200 mL/hr over 30 Minutes Intravenous Every 24 hours 08/31/20 0350 08/31/20 1719   08/31/20 2030  vancomycin (VANCOREADY) IVPB 1500 mg/300 mL        1,500 mg 150 mL/hr over 120 Minutes Intravenous  Once 08/31/20 2016 08/31/20 2338   08/31/20 2015  clindamycin (CLEOCIN) IVPB 900 mg  Status:  Discontinued        900 mg 100 mL/hr over 30 Minutes Intravenous Every 8 hours 08/31/20 1926 09/01/20 1033   08/31/20 1812  vancomycin variable dose per unstable renal function (pharmacist dosing)  Status:  Discontinued         Does not apply See admin instructions 08/31/20 1812 09/01/20 1033   08/31/20 1730  cefTRIAXone (ROCEPHIN) 2 g in sodium chloride 0.9 % 100 mL IVPB  Status:  Discontinued        2 g 200 mL/hr over 30 Minutes Intravenous Every 24 hours 08/31/20 1720 08/31/20 1751   08/31/20 1015  metroNIDAZOLE (FLAGYL) IVPB 500 mg  Status:  Discontinued        500 mg 100 mL/hr over 60 Minutes Intravenous Every 8 hours 08/31/20 1012 08/31/20 1719   08/31/20 0045  ceFEPIme (MAXIPIME) 2 g in sodium chloride 0.9 % 100 mL IVPB        2 g 200 mL/hr over 30 Minutes Intravenous  Once 08/31/20 0030 08/31/20 0208   08/31/20 0000  vancomycin (VANCOREADY) IVPB 1500 mg/300 mL        1,500 mg 150 mL/hr over 120 Minutes Intravenous  Once 08/30/20 2351 08/31/20 0208   08/30/20 1900  cefTRIAXone (ROCEPHIN) 2 g in sodium chloride 0.9 % 100 mL IVPB  Status:  Discontinued        2 g 200 mL/hr over 30 Minutes Intravenous Every 24 hours 08/30/20 1855 08/31/20 0030       Subjective: Seen and examined at bedside and he was in postoperative pain and states that pain was very bad. No chest pain, lightheadedness or dizziness but felt leg pain. No other concerns or complaints at this time.  . Felt okay.Objective: Vitals:   09/09/20 1625 09/09/20 1640  09/09/20 1655 09/09/20 1740  BP: 123/85 126/86 112/78 108/72  Pulse: (!) 103 94 85 79  Resp:      Temp:      TempSrc:      SpO2: 95% 93% 94% 94%  Weight:      Height:        Intake/Output Summary (Last 24 hours) at 09/09/2020 1918 Last data filed at 09/09/2020 1119 Gross per 24 hour  Intake 2950.28 ml  Output 725 ml  Net 2225.28 ml   Filed Weights   09/07/20 0556 09/08/20 0405 09/09/20 0406  Weight: 120.8 kg 112.3 kg 109.6 kg   Examination: Physical Exam:  Constitutional: WN/WD obese African-American male currently in some distress and appears uncomfortable and in pain  Eyes: Lids and conjunctivae normal, sclerae anicteric  ENMT: External Ears, Nose appear normal. Grossly normal hearing. Neck: Appears normal, supple, no cervical masses, normal ROM, no appreciable thyromegaly; no JVD Respiratory: Diminished to auscultation bilaterally, no wheezing, rales, rhonchi or crackles. Normal respiratory effort and patient is not tachypenic. No accessory muscle use. Unlabored breathing Cardiovascular: RRR, no murmurs / rubs /  gallops. S1 and S2 auscultated.  Abdomen: Soft, non-tender, distended secondary body habitus.  Bowel sounds positive.  GU: Deferred. Musculoskeletal: No clubbing / cyanosis of digits/nails. Has a left AKA now Skin: No rashes, lesions, ulcers on limited skin evaluation and his wound is connected to the wound VAC. No induration; Warm and dry.  Neurologic: CN 2-12 grossly intact with no focal deficits. Romberg sign and cerebellar reflexes not assessed.  Psychiatric: Normal judgment and insight. Alert and oriented x 3. Normal mood and appropriate affect.   Data Reviewed: I have personally reviewed following labs and imaging studies  CBC: Recent Labs  Lab 09/05/20 0420 09/06/20 0441 09/06/20 2107 09/07/20 0019 09/07/20 1119 09/08/20 0159 09/09/20 0531 09/09/20 1036  WBC 18.9* 14.9*  --  14.5*  --  16.9* 16.3*  --   NEUTROABS  --   --   --   --   --  12.1* 11.7*   --   HGB 6.8* 7.5*   < > 7.7* 8.5* 8.0* 6.9* 7.8*  HCT 19.0* 22.1*   < > 23.8* 25.1* 24.7* 22.4* 23.0*  MCV 83.0 86.7  --  90.2  --  90.5 94.9  --   PLT 530* 538*  --  487*  --  505* 462*  --    < > = values in this interval not displayed.   Basic Metabolic Panel: Recent Labs  Lab 09/05/20 0420 09/06/20 0441 09/07/20 0019 09/08/20 0159 09/09/20 0108 09/09/20 1036  NA 144 144 144 145 143 145  K 3.1* 3.3* 3.5 3.6 4.6 5.3*  CL 110 109 109 110 109  --   CO2 _0 21*  --   GLUCOSE 114* 99 97 116* 83  --   BUN 79* 67* 56* 40* 29*  --   CREATININE 4.96* 3.73* 2.84* 1.94* 1.86*  --   CALCIUM 7.7* 7.8* 7.8* 7.8* 7.7*  --   MG  --   --   --  1.4*  --   --   PHOS  --   --   --  3.2  --   --    GFR: Estimated Creatinine Clearance: 51.2 mL/min (A) (by C-G formula based on SCr of 1.86 mg/dL (H)). Liver Function Tests: Recent Labs  Lab 09/04/20 1446 09/07/20 0019 09/08/20 0159 09/09/20 0108  AST 75* 38 31 31  ALT 45* _1 ALKPHOS 80 73 68 64  BILITOT 1.3* 0.7 1.1 0.9  PROT 5.7* 6.1* 6.2* 5.9*  ALBUMIN 1.9* 2.0* 2.1* 2.0*   No results for input(s): LIPASE, AMYLASE in the last 168 hours. No results for input(s): AMMONIA in the last 168 hours. Coagulation Profile: No results for input(s): INR, PROTIME in the last 168 hours. Cardiac Enzymes: Recent Labs  Lab 09/05/20 0420 09/06/20 0441 09/07/20 0019 09/08/20 0159 09/09/20 0108  CKTOTAL 2,084* 1,066* 803* 660* 428*   BNP (last 3 results) No results for input(s): PROBNP in the last 8760 hours. HbA1C: No results for input(s): HGBA1C in the last 72 hours. CBG: Recent Labs  Lab 09/03/20 0745 09/04/20 0741 09/05/20 1509 09/09/20 1120  GLUCAP 100* 104* 116* 112*   Lipid Profile: No results for input(s): CHOL, HDL, LDLCALC, TRIG, CHOLHDL, LDLDIRECT in the last 72 hours. Thyroid Function Tests: No results for input(s): TSH, T4TOTAL, FREET4, T3FREE, THYROIDAB in the last 72 hours. Anemia Panel: No results  for input(s): VITAMINB12, FOLATE, FERRITIN, TIBC, IRON, RETICCTPCT in the last 72 hours. Sepsis Labs: Recent Labs  Lab 09/03/20  8413 09/04/20 1446 09/05/20 0420 09/06/20 0441  PROCALCITON 20.62 7.47 5.24 2.99    Recent Results (from the past 240 hour(s))  Blood Culture (routine x 2)     Status: Abnormal   Collection Time: 08/30/20  7:34 PM   Specimen: BLOOD  Result Value Ref Range Status   Specimen Description   Final    BLOOD BLOOD LEFT ARM Performed at [facility], [address], [place], [place] [address]    Special Requests   Final    BOTTLES DRAWN AEROBIC AND ANAEROBIC Blood Culture adequate volume Performed at [place]., [place], [place] [address]    Culture  Setup Time   Final    GRAM POSITIVE COCCI IN CHAINS ANAEROBIC BOTTLE ONLY Gram Stain Report Called to,Read Back By and Verified With: MICHAEL DOSS _0  08/31/20 BY JONES,T [facility] CORRECTED RESULTS PREVIOUSLY REPORTED AS: GRAM POSITIVE RODS CORRECTED RESULTS CALLED TO: S HURTH PHARMD _1  08/31/20 EB Performed at [facility], [place] [address] [place], [place] [address]    Culture STREPTOCOCCUS GROUP G (A)  Final   Report Status 09/02/2020 FINAL  Final   Organism ID, Bacteria STREPTOCOCCUS GROUP G  Final      Susceptibility   Streptococcus group g - MIC*    CLINDAMYCIN <=0.25 SENSITIVE Sensitive     AMPICILLIN <=0.25 SENSITIVE Sensitive     ERYTHROMYCIN <=0.12 SENSITIVE Sensitive     VANCOMYCIN <=0.12 SENSITIVE Sensitive     CEFTRIAXONE <=0.12 SENSITIVE Sensitive     LEVOFLOXACIN 0.5 SENSITIVE Sensitive     * STREPTOCOCCUS GROUP G  Blood Culture (routine x 2)     Status: Abnormal   Collection Time: 08/30/20  7:35 PM   Specimen: BLOOD RIGHT ARM  Result Value Ref Range Status   Specimen Description   Final    BLOOD RIGHT ARM Performed at [facility], [address], [place], [place] [address]    Special Requests   Final    BOTTLES DRAWN AEROBIC AND ANAEROBIC Blood Culture adequate  volume Performed at [facility], [address], [place], [place] [address]    Culture  Setup Time   Final    GRAM POSITIVE COCCI IN CHAINS IN BOTH AEROBIC AND ANAEROBIC BOTTLES Gram Stain Report Called to,Read Back By and Verified With: MICHAEL DOSS _2  08/31/20 BY JONES,T [facility] Organism ID to follow CORRECTED RESULTS PREVIOUSLY REPORTED AS: GRAM POSITIVE RODS CORRECTED RESULTS CALLED TO: S HURTH PHARMD _3  08/31/20 EB    Culture (A)  Final    STREPTOCOCCUS GROUP G SUSCEPTIBILITIES PERFORMED ON PREVIOUS CULTURE WITHIN THE LAST 5 DAYS. Performed at [facility], [place] [address], [place], [place] [address]    Report Status 09/02/2020 FINAL  Final  Resp Panel by RT-PCR (Flu A&B, Covid) Nasopharyngeal Swab     Status: None   Collection Time: 08/30/20  7:35 PM   Specimen: Nasopharyngeal Swab; Nasopharyngeal(NP) swabs in vial transport medium  Result Value Ref Range Status   SARS Coronavirus 2 by RT PCR NEGATIVE NEGATIVE Final    Comment: (NOTE) SARS-CoV-2 target nucleic acids are NOT DETECTED.  The SARS-CoV-2 RNA is generally detectable in upper respiratory specimens during the acute phase of infection. The lowest concentration of SARS-CoV-2 viral copies this assay can detect is 138 copies/mL. A negative result does not preclude SARS-Cov-2 infection and should not be used as the sole basis for treatment or other patient management decisions. A negative result may occur with  improper specimen collection/handling, submission  of specimen other than nasopharyngeal swab, presence of viral mutation(s) within the areas targeted by this assay, and inadequate number of viral copies(<138 copies/mL). A negative result must be combined with clinical observations, patient history, and epidemiological information. The expected result is Negative.  Fact Sheet for Patients:  EntrepreneurPulse.com.au  Fact Sheet for Healthcare Providers:   IncredibleEmployment.be  This test is no t yet approved or cleared by the [place] FDA and  has been authorized for detection and/or diagnosis of SARS-CoV-2 by FDA under an Emergency Use Authorization (EUA). This EUA will remain  in effect (meaning this test can be used) for the duration of the COVID-19 declaration under Section 564(b)(1) of the Act, 21 U.S.C.section 360bbb-3(b)(1), unless the authorization is terminated  or revoked sooner.       Influenza A by PCR NEGATIVE NEGATIVE Final   Influenza B by PCR NEGATIVE NEGATIVE Final    Comment: (NOTE) The Xpert Xpress SARS-CoV-2/FLU/RSV plus assay is intended as an aid in the diagnosis of influenza from Nasopharyngeal swab specimens and should not be used as a sole basis for treatment. Nasal washings and aspirates are unacceptable for Xpert Xpress SARS-CoV-2/FLU/RSV testing.  Fact Sheet for Patients: EntrepreneurPulse.com.au  Fact Sheet for Healthcare Providers: IncredibleEmployment.be  This test is not yet approved or cleared by the [place] FDA and has been authorized for detection and/or diagnosis of SARS-CoV-2 by FDA under an Emergency Use Authorization (EUA). This EUA will remain in effect (meaning this test can be used) for the duration of the COVID-19 declaration under Section 564(b)(1) of the Act, 21 U.S.C. section 360bbb-3(b)(1), unless the authorization is terminated or revoked.  Performed at [facility], [address], [place], [place] [address]   Blood Culture ID Panel (Reflexed)     Status: Abnormal   Collection Time: 08/30/20  7:35 PM  Result Value Ref Range Status   Enterococcus faecalis NOT DETECTED NOT DETECTED Final   Enterococcus Faecium NOT DETECTED NOT DETECTED Final   Listeria monocytogenes NOT DETECTED NOT DETECTED Final   Staphylococcus species NOT DETECTED NOT DETECTED Final   Staphylococcus aureus (BCID) NOT DETECTED NOT DETECTED  Final   Staphylococcus epidermidis NOT DETECTED NOT DETECTED Final   Staphylococcus lugdunensis NOT DETECTED NOT DETECTED Final   Streptococcus species DETECTED (A) NOT DETECTED Final    Comment: Not Enterococcus species, Streptococcus agalactiae, Streptococcus pyogenes, or Streptococcus pneumoniae. CRITICAL RESULT CALLED TO, READ BACK BY AND VERIFIED WITH: G. Coffee PharmD 16:30 08/31/20 ([email or web address])    Streptococcus agalactiae NOT DETECTED NOT DETECTED Final   Streptococcus pneumoniae NOT DETECTED NOT DETECTED Final   Streptococcus pyogenes NOT DETECTED NOT DETECTED Final   A.calcoaceticus-baumannii NOT DETECTED NOT DETECTED Final   Bacteroides fragilis NOT DETECTED NOT DETECTED Final   Enterobacterales NOT DETECTED NOT DETECTED Final   Enterobacter cloacae complex NOT DETECTED NOT DETECTED Final   Escherichia coli NOT DETECTED NOT DETECTED Final   Klebsiella aerogenes NOT DETECTED NOT DETECTED Final   Klebsiella oxytoca NOT DETECTED NOT DETECTED Final   Klebsiella pneumoniae NOT DETECTED NOT DETECTED Final   Proteus species NOT DETECTED NOT DETECTED Final   Salmonella species NOT DETECTED NOT DETECTED Final   Serratia marcescens NOT DETECTED NOT DETECTED Final   Haemophilus influenzae NOT DETECTED NOT DETECTED Final   Neisseria meningitidis NOT DETECTED NOT DETECTED Final   Pseudomonas aeruginosa NOT DETECTED NOT DETECTED Final   Stenotrophomonas maltophilia NOT DETECTED NOT DETECTED Final   Candida albicans NOT DETECTED NOT DETECTED Final   Candida auris NOT  DETECTED NOT DETECTED Final   Candida glabrata NOT DETECTED NOT DETECTED Final   Candida krusei NOT DETECTED NOT DETECTED Final   Candida parapsilosis NOT DETECTED NOT DETECTED Final   Candida tropicalis NOT DETECTED NOT DETECTED Final   Cryptococcus neoformans/gattii NOT DETECTED NOT DETECTED Final    Comment: Performed at [facility], [place] [address] [place], [place] [address]  Urine culture     Status: Abnormal    Collection Time: 08/31/20  1:30 AM   Specimen: In/Out Cath Urine  Result Value Ref Range Status   Specimen Description   Final    IN/OUT CATH URINE Performed at [facility], [address], [place], [place] [address]    Special Requests   Final    NONE Performed at [facility], [address] [place], [place] [address]    Culture 1,000 COLONIES/mL ENTEROCOCCUS FAECALIS (A)  Final   Report Status 09/02/2020 FINAL  Final   Organism ID, Bacteria ENTEROCOCCUS FAECALIS (A)  Final      Susceptibility   Enterococcus faecalis - MIC*    AMPICILLIN <=2 SENSITIVE Sensitive     NITROFURANTOIN <=16 SENSITIVE Sensitive     VANCOMYCIN 1 SENSITIVE Sensitive     * 1,000 COLONIES/mL ENTEROCOCCUS FAECALIS  MRSA PCR Screening     Status: None   Collection Time: 08/31/20 11:36 AM   Specimen: Nasal Mucosa; Nasopharyngeal  Result Value Ref Range Status   MRSA by PCR NEGATIVE NEGATIVE Final    Comment:        The GeneXpert MRSA Assay (FDA approved for NASAL specimens only), is one component of a comprehensive MRSA colonization surveillance program. It is not intended to diagnose MRSA infection nor to guide or monitor treatment for MRSA infections. Performed at [facility], [address] [place], [place] [address]   Culture, blood (routine x 2)     Status: None   Collection Time: 09/03/20  5:38 PM   Specimen: BLOOD RIGHT HAND  Result Value Ref Range Status   Specimen Description BLOOD RIGHT HAND  Final   Special Requests   Final    AEROBIC BOTTLE ONLY Blood Culture results may not be optimal due to an inadequate volume of blood received in culture bottles   Culture   Final    NO GROWTH 5 DAYS Performed at [facility], [place] [address] [place], [place] [address]    Report Status 09/08/2020 FINAL  Final  Culture, blood (routine x 2)     Status: None   Collection Time: 09/03/20  5:39 PM   Specimen: BLOOD RIGHT HAND  Result Value Ref Range Status   Specimen Description BLOOD RIGHT  HAND  Final   Special Requests   Final    AEROBIC BOTTLE ONLY Blood Culture results may not be optimal due to an inadequate volume of blood received in culture bottles   Culture   Final    NO GROWTH 5 DAYS Performed at [facility], [place] [address], [place], [place] [address]    Report Status 09/08/2020 FINAL  Final  Aerobic/Anaerobic Culture (surgical/deep wound)     Status: None (Preliminary result)   Collection Time: 09/07/20  9:02 AM   Specimen: Leg, Left; Tissue  Result Value Ref Range Status   Specimen Description TISSUE  Final   Special Requests LEFT THIGH FACIA SPEC A  Final   Gram Stain NO WBC SEEN NO ORGANISMS SEEN   Final   Culture   Final  NO GROWTH 2 DAYS NO ANAEROBES ISOLATED; CULTURE IN PROGRESS FOR 5 DAYS Performed at [facility] [address], [place], [place] [address]    Report Status PENDING  Incomplete  Aerobic/Anaerobic Culture (surgical/deep wound)     Status: None (Preliminary result)   Collection Time: 09/07/20  9:05 AM   Specimen: Leg, Left; Tissue  Result Value Ref Range Status   Specimen Description TISSUE  Final   Special Requests LEFT CALF MUSCLE SPEC B  Final   Gram Stain   Final    RARE WBC PRESENT, PREDOMINANTLY MONONUCLEAR NO ORGANISMS SEEN    Culture   Final    NO GROWTH 2 DAYS NO ANAEROBES ISOLATED; CULTURE IN PROGRESS FOR 5 DAYS Performed at [facility], [place] [address], [place], [place] [address]    Report Status PENDING  Incomplete  Surgical pcr screen     Status: None   Collection Time: 09/09/20  4:22 AM   Specimen: Nasal Mucosa; Nasal Swab  Result Value Ref Range Status   MRSA, PCR NEGATIVE NEGATIVE Final   Staphylococcus aureus NEGATIVE NEGATIVE Final    Comment: (NOTE) The Xpert SA Assay (FDA approved for NASAL specimens in patients 23 years of age and older), is one component of a comprehensive surveillance program. It is not intended to diagnose infection nor to guide or monitor treatment. Performed at  [facility], [place] [address], [place],  [address]     RN Pressure Injury Documentation:     Estimated body mass index is 33.7 kg/m as calculated from the following:   Height as of this encounter: _0  (1.803 m).   Weight as of this encounter: 109.6 kg.  Malnutrition Type:   Malnutrition Characteristics:   Nutrition Interventions    Radiology Studies: No results found.  Scheduled Meds: . aspirin EC  81 mg Oral Daily  . diltiazem  30 mg Oral Q6H  . feeding supplement  237 mL Oral BID BM  . gabapentin  300 mg Oral TID  . labetalol  200 mg Oral BID  . mouth rinse  15 mL Mouth Rinse BID  . sodium chloride flush  10-40 mL Intracatheter Q12H   Continuous Infusions: . sodium chloride 75 mL/hr at 09/09/20 1235  . cefTRIAXone (ROCEPHIN)  IV 2 g (09/08/20 2120)  . linezolid (ZYVOX) IV 600 mg (09/08/20 2239)    LOS: 10 days   Kerney Elbe, DO [facility] PAGER is on AMION  If 7PM-7AM, please contact night-coverage [email or web address]

## 2020-09-09 NOTE — Progress Notes (Signed)
PT Cancellation Note  Patient Details Name: Connor [name] MRN: [id number] DOB: 1956/03/14   Cancelled Treatment:    Reason Eval/Treat Not Completed: Patient at procedure or test/unavailable   Porsche Noguchi B Dillyn Menna 09/09/2020, 7:15 AM  Merryl Hacker, PT Acute Rehabilitation Services Pager: [phone] Office: [phone]

## 2020-09-09 NOTE — Transfer of Care (Signed)
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  Last Pain:  Vitals:   09/09/20 0905  TempSrc: Oral  PainSc:       Patients Stated Pain Goal: 2 (09/09/20 0549)  Complications: No complications documented.

## 2020-09-09 NOTE — Anesthesia Postprocedure Evaluation (Signed)
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  Last Vitals:  Vitals:   09/09/20 1148 09/09/20 1203  BP: (!) 165/103 (!) 165/94  Pulse: (!) 105 (!) 108  Resp: 19 13  Temp:  (!) 36.1 C  SpO2: 100% 93%    Last Pain:  Vitals:   09/09/20 1203  TempSrc:   PainSc: Asleep                 Lannie Fields

## 2020-09-09 NOTE — Interval H&P Note (Signed)
History and Physical Interval Note:  09/09/2020 6:44 AM  Connor [name]  has presented today for surgery, with the diagnosis of Necrotizing Fasciitis Left Leg.  The various methods of treatment have been discussed with the patient and family. After consideration of risks, benefits and other options for treatment, the patient has consented to  Procedure(s): REVISION LEFT ABOVE KNEE AMPUTATION (Left) as a surgical intervention.  The patient's history has been reviewed, patient examined, no change in status, stable for surgery.  I have reviewed the patient's chart and labs.  Questions were answered to the patient's satisfaction.     Nadara Mustard

## 2020-09-09 NOTE — Plan of Care (Signed)

## 2020-09-09 NOTE — TOC Progression Note (Signed)
Transition of Care [facility]) - Progression Note    Patient Details  Name: Connor [name] MRN: [id number] Date of Birth: [birth year]  [facility] CM/SW Contact  Graves-Bigelow, Lamar Laundry, RN Phone Number: 09/09/2020, 4:39 PM  Clinical Narrative: Patient post revision of left AKA. Per CSW, patient has declined SNF. Inpatient Rehab Admissions Coordinator is following the patient for CIR. Case Manager will continue to follow for additional transition of care needs.   Expected Discharge Plan: IP Rehab Facility Barriers to Discharge: Continued Medical Work up  Expected Discharge Plan and Services Expected Discharge Plan: IP Rehab Facility In-house Referral: Clinical Social Work     Living arrangements for the past 2 months: Single Family Home                  Readmission Risk Interventions No flowsheet data found.

## 2020-09-10 ENCOUNTER — Encounter (HOSPITAL_COMMUNITY): Payer: Self-pay | Admitting: Orthopedic Surgery

## 2020-09-10 DIAGNOSIS — A409 Streptococcal sepsis, unspecified: Secondary | ICD-10-CM | POA: Diagnosis not present

## 2020-09-10 DIAGNOSIS — N1831 Chronic kidney disease, stage 3a: Secondary | ICD-10-CM | POA: Diagnosis not present

## 2020-09-10 DIAGNOSIS — M726 Necrotizing fasciitis: Secondary | ICD-10-CM | POA: Diagnosis not present

## 2020-09-10 DIAGNOSIS — L03116 Cellulitis of left lower limb: Secondary | ICD-10-CM | POA: Diagnosis not present

## 2020-09-10 DIAGNOSIS — R6520 Severe sepsis without septic shock: Secondary | ICD-10-CM | POA: Diagnosis not present

## 2020-09-10 DIAGNOSIS — I4819 Other persistent atrial fibrillation: Secondary | ICD-10-CM | POA: Diagnosis not present

## 2020-09-10 DIAGNOSIS — N179 Acute kidney failure, unspecified: Secondary | ICD-10-CM | POA: Diagnosis not present

## 2020-09-10 LAB — COMPREHENSIVE METABOLIC PANEL
ALT: 17 U/L (ref 0–44)
AST: 24 U/L (ref 15–41)
Albumin: 2 g/dL — ABNORMAL LOW (ref 3.5–5.0)
Alkaline Phosphatase: 60 U/L (ref 38–126)
Anion gap: 9 (ref 5–15)
BUN: 28 mg/dL — ABNORMAL HIGH (ref 8–23)
CO2: 26 mmol/L (ref 22–32)
Calcium: 7.7 mg/dL — ABNORMAL LOW (ref 8.9–10.3)
Chloride: 109 mmol/L (ref 98–111)
Creatinine, Ser: 2.04 mg/dL — ABNORMAL HIGH (ref 0.61–1.24)
GFR, Estimated: 36 mL/min — ABNORMAL LOW (ref >60)
Glucose, Bld: 164 mg/dL — ABNORMAL HIGH (ref 70–99)
Potassium: 4.6 mmol/L (ref 3.5–5.1)
Sodium: 144 mmol/L (ref 135–145)
Total Bilirubin: 0.8 mg/dL (ref 0.3–1.2)
Total Protein: 5.9 g/dL — ABNORMAL LOW (ref 6.5–8.1)

## 2020-09-10 LAB — CBC WITH DIFFERENTIAL/PLATELET
Abs Immature Granulocytes: 0.61 10*3/uL — ABNORMAL HIGH (ref 0.00–0.07)
Basophils Absolute: 0 10*3/uL (ref 0.0–0.1)
Basophils Relative: 0 %
Eosinophils Absolute: 0 10*3/uL (ref 0.0–0.5)
Eosinophils Relative: 0 %
HCT: 21.1 % — ABNORMAL LOW (ref 39.0–52.0)
Hemoglobin: 6.7 g/dL — CL (ref 13.0–17.0)
Immature Granulocytes: 4 %
Lymphocytes Relative: 6 %
Lymphs Abs: 1 10*3/uL (ref 0.7–4.0)
MCH: 29.9 pg (ref 26.0–34.0)
MCHC: 31.8 g/dL (ref 30.0–36.0)
MCV: 94.2 fL (ref 80.0–100.0)
Monocytes Absolute: 0.8 10*3/uL (ref 0.1–1.0)
Monocytes Relative: 5 %
Neutro Abs: 14.7 10*3/uL — ABNORMAL HIGH (ref 1.7–7.7)
Neutrophils Relative %: 85 %
Platelets: 392 10*3/uL (ref 150–400)
RBC: 2.24 MIL/uL — ABNORMAL LOW (ref 4.22–5.81)
RDW: 16.8 % — ABNORMAL HIGH (ref 11.5–15.5)
WBC: 17.2 10*3/uL — ABNORMAL HIGH (ref 4.0–10.5)
nRBC: 0.3 % — ABNORMAL HIGH (ref 0.0–0.2)

## 2020-09-10 LAB — CK: Total CK: 474 U/L — ABNORMAL HIGH (ref 49–397)

## 2020-09-10 LAB — HEMOGLOBIN AND HEMATOCRIT, BLOOD
HCT: 24.1 % — ABNORMAL LOW (ref 39.0–52.0)
Hemoglobin: 7.5 g/dL — ABNORMAL LOW (ref 13.0–17.0)

## 2020-09-10 LAB — MAGNESIUM: Magnesium: 1.6 mg/dL — ABNORMAL LOW (ref 1.7–2.4)

## 2020-09-10 LAB — PREPARE RBC (CROSSMATCH)

## 2020-09-10 LAB — PHOSPHORUS: Phosphorus: 3.1 mg/dL (ref 2.5–4.6)

## 2020-09-10 MED ORDER — SODIUM CHLORIDE 0.9% IV SOLUTION: Freq: Once | INTRAVENOUS | Status: AC

## 2020-09-10 MED ORDER — FUROSEMIDE 10 MG/ML IJ SOLN
20.0000 mg | Freq: Once | INTRAMUSCULAR | Status: AC
  Administered 2020-09-10: 20 mg via INTRAVENOUS
  Filled 2020-09-10: qty 2

## 2020-09-10 MED ORDER — DILTIAZEM HCL ER COATED BEADS 180 MG PO CP24
180.0000 mg | ORAL_CAPSULE | Freq: Every day | ORAL | Status: DC
Start: 2020-09-10 — End: 2020-09-13
  Administered 2020-09-10 – 2020-09-12 (×3): 180 mg via ORAL
  Filled 2020-09-10 (×3): qty 1

## 2020-09-10 NOTE — Progress Notes (Signed)
PROGRESS NOTE    Connor [name]  NIO:[id number] DOB: June 24, 1956 DOA: 08/30/2020 PCP: Patient, No Pcp Per   Brief Narrative:  Connor [name] is a 65 y.o. male with PMH significant for HTN, CKD 3, A. 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  Patient presented to the ED on 1/4 with 1 week history of nausea, vomiting, chills followed by worsening left leg pain and swelling.  In the ED, he was noted to be in severe sepsis with WBC count elevated to 15, lactic acid 2, creatinine 4.2 with a baseline of 1.3, potassium low at 2.8.  CK level was elevated more than 28,000. Ultrasound rule out DVT.   CT scan of left leg showed soft tissue swelling but no muscle damage and no signs of abscess although limited due to lack of contrast and CT due to renal function.  MRI noted signs of myofascitis.  Patient was admitted to hospitalist service for cellulitis, rhabdomyolysis, AKI. Orthopedic and nephrology consultation were obtained. Blood cultures sent on admission grew Streptococcus group G, cellulitis likely being the source.    Over the course of hospitalization, patient had improvement in parameters for sepsis, rhabdomyolysis, AKI.  However his left leg continued to remain swollen and tender. Orthopedic consultation was obtained with Dr. Sharol Given. 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    09/10/2020 his blood count dropped again in 1-6.7 so we will type and screen H&H is 1 unit of PRBCs.  After infusion his hemoglobin appropriate responded to 7.5   Assessment & Plan:   Principal Problem:   Severe sepsis with acute organ dysfunction due to Streptococcus species [facility]) Active Problems:   Persistent  atrial fibrillation (HCC)   Hypertension   Cellulitis of left leg   Lactic acidosis   Elevated troponin   Hypoalbuminemia   Leukocytosis   Thrombocytopenia (HCC)   Hypokalemia   Hyperglycemia   Transaminitis   Total bilirubin, elevated   AKI (acute kidney injury) (HCC)   Nausea vomiting and diarrhea   CKD (chronic kidney disease), stage III (HCC)   Morbid obesity ([place])   Sepsis with acute renal failure without septic shock ([place])   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)  Necrotizing fasciitis of left leg -Initially presented as left leg cellulitis.  Also had evidence of myositis on MRI and rhabdomyolysis. -Sepsis parameters improved with antibiotics but left leg continues to remain swollen and tender. -Orthopedic consultation was obtained with Dr. Sharol Given. 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 -Currently on gabapentin and Dilaudid 1 mg IV every 2 hours as needed for pain control..  May need adjustment -Underwent Revision of AKA 09/09/2020 -Previous culture showed group B strep and he is status post amputation for source control.  ID is now recommending continue IV vancomycin while inpatient and discharged changed to oral amoxicillin 5 mg 3 times daily through 09/16/2020 and stopping  Bacteremia with Streptococcus group G -Left leg cellulitis/myositis probably the source.   -Currently on IV Rocephin.  Repeat blood culture from 1/8 did not show any growth.   -TEE 1/10 did not show any evidence of vegetation.  -ID consult appreciated.  We will continue  IV ceftriaxone as an inpatient and at discharge will transition to p.o. amoxicillin 500 mg 3 times daily through 09/16/2020  Severe sepsis - POA -Secondary to cellulitis.  Sepsis resolved.   -WBC count slightly bumped yesterdayin response to surgery and is now down to 16.3 Last Labs              Recent Labs  Lab 09/01/20 1026  09/01/20 1538 09/02/20 0416 09/03/20 0419 09/04/20 1446 09/05/20 0420 09/06/20 0441 09/07/20 0019 09/08/20 0159  WBC  --    < > 22.8* 25.8* 20.4* 18.9* 14.9* 14.5* 16.9*  LATICACIDVEN 0.9  --   --   --   --   --   --   --   --   PROCALCITON 43.41  --  34.97 20.62 7.47 5.24 2.99  --   --    < > = values in this interval not displayed.     Acute kidney injury -Baseline creatinine less than 1.4.  Patient with elevated creatinine of 4.2, peaked at 7.6 on 1/7.  AKI to be due to acute tubular injury and decreased renal perfusion in the setting of rhabdomyolysis. Creatinine improving gradually.  Currently being monitored off fluid. -BUN/Cr is improving and is now 29/1.86 yesterday but slightly bumped in 1-28/2.04  Rhabdomyolysis  Elevated transaminases -Secondary to left leg myofascitis -CK level and transaminases level are trending down as below with IV fluid. -Currently off IV fluid Last Labs            Recent Labs  Lab 09/02/20 0416 09/03/20 0419 09/04/20 1446 09/05/20 0420 09/06/20 0441 09/07/20 0019 09/08/20 0159  CKTOTAL 16,946* 8,688* 3,244* 2,084* 1,066* 803* 660*     Hepatic Function Latest Ref Rng & Units 09/10/2020 09/09/2020 09/08/2020  Total Protein 6.5 - 8.1 g/dL 5.9(L) 5.9(L) 6.2(L)  Albumin 3.5 - 5.0 g/dL 2.0(L) 2.0(L) 2.1(L)  AST 15 - 41 U/L _0 ALT 0 - 44 U/L _1 Alk Phosphatase 38 - 126 U/L 60 64 68  Total Bilirubin 0.3 - 1.2 mg/dL 0.8 0.9 1.1  Bilirubin, Direct 0.0 - 0.2 mg/dL - 0.3(H) 0.3(H)   Acute anemia/acute blood loss anemia -Unclear cause. No active bleeding but hemoglobin is running low. -received a total of 5 units of PRBCs this hospital stay will get 1 today) -Hemoglobin further down to 8 yesterday morning, probably due to surgical blood loss.  Continue to monitor. -Iron, ferritin level normal.  Probably not suggestive of chronic GI bleeding. -Hb/Hct went from 8.0/24.7 -> 6.9/22.4 -> 7.8/23.0 and dropped to 6.7/21.1 this morning  received another 1 unit today and after his hemoglobin trended up to 7.5/24.1 -Continue to Monitor for S/Sx of Bleeding as he has been on Heparin gtt but has been held -Repeat CBC in the a.m.  Essential hypertension Chronic diastolic dysfunction -Home meds include labetalol 200 mg twice daily, nifedipine 90 mg daily, Aldactone 25 mg daily, valsartan 80 mg daily -Meds initially held because of sepsis.  -Currently on labetalol 200 mg twice daily and nifedipine 90 mg daily.  Because of A. fib, cardiology and switched him from nifedipine to Cardizem this morning.  Continue to monitor -Echo from 1/6 with EF 60 to 65% and grade 2 diastolic dysfunction.  Persistent A. Fib -Patient has history of A. fib and DC cardioversion. -On admission, he was noted to be in A. fib and was started on amiodarone drip. Currently off amiodarone drip.  On labetalol.  Also added Cardizem and is on  30 mg q6h -Prior to admission, patient was on Eliquis for anticoagulation.  In the hospital, he has remained on heparin drip but held due to anemia.  Plan to reinitiate Eliquis prior to discharge once his hemoglobin stabilizes.   -Continue to Monitor on Telemetry ' -Cardiology is following and they have increased his Cardizem and recommended continue labetalol.  He is changed to Cardizem CD 180 mg p.o. daily and they are recommending resuming apixaban when able and his heparin drip is on hold due to his anemia -Dr. Stanford Breed believes that the patient is essentially asymptomatic and the rate control and anticoagulation will be the best long-term option for this patient  History of type aortic arch dissection  -He is status post repair in 2016 and had dissection at anastomosis suture line seen December 2019.  Course was complicated by cardiogenic shock with EF of 15%.  Ultimately underwent total arch replacement in February 2020 while at [place].  -Continue aspirin and Eliquis but was on Heparin gtt which has been held given  Anemia. ?Not on a statin. -Lipid panel with HDL less than 10. Will discuss with cardiology about any specific statin/fibrate he will benefit from.  Chronic hypokalemia Hypomagnesemia -Potassium level running low mostly between 3-3.5. -Magnesium level low at 1.4 Yesterday .  Replacement ordered. -K+ is now 4.6 -> 5.3 and has improved to 4.6 -Mag was 1.6 this morning replete with IV mag sulfate 2 g -Continue to Monitor and Trend  -Repeat CMP and Mag in the AM   Hyperglycemia -No history of diabetes.  A1c 4.6 on 1/7. -Fingersticks consistently less than 150. Recent Labs    09/09/20 1120  GLUCAP 112*   Obesity -Complicates overall prognosis and care -Estimated body mass index is 33.7 kg/m as calculated from the following:   Height as of this encounter: $RemoveBeforeD'5\' 11"'[id number]$  (1.803 m).   Weight as of this encounter: 109.6 kg. -Weight Loss and Dietary Counseling given   DVT prophylaxis: SCDs, Will discuss with Ortho about resuming Heparin gtt  Code Status: FULL CODE  Family Communication: No family present at bedside  Disposition Plan: CIR; Has declined SNF; will reach out to inpatient rehab as PT still recommends CIR after his AKA revision  Status is: Inpatient  Remains inpatient appropriate because:Unsafe d/c plan, IV treatments appropriate due to intensity of illness or inability to take PO and Inpatient level of care appropriate due to severity of illness   Dispo: The patient is from: Home              Anticipated d/c is to: CIR              Anticipated d/c date is: 3 days              Patient currently is not medically stable to d/c.   Consultants:   Nephrology  Cardiology  Infectious Diseases  Orthopedic Surgery  General Surgery    Procedures:  REVISION LEFT ABOVE KNEE AMPUTATION on 08/28/56 Application of Prevena customizable and Arthur form wound VAC  Antimicrobials:  Anti-infectives (From admission, onward)   Start     Dose/Rate Route Frequency Ordered Stop    09/09/20 0600  ceFAZolin (ANCEF) IVPB 2g/100 mL premix        2 g 200 mL/hr over 30 Minutes Intravenous To Short Stay 09/09/20 0213 09/09/20 1103   09/08/20 1000  linezolid (ZYVOX) IVPB 600 mg        600 mg 300 mL/hr over 60 Minutes Intravenous Every 12  hours 09/08/20 0838     09/07/20 1015  ceFAZolin (ANCEF) 3 g in dextrose 5 % 50 mL IVPB  Status:  Discontinued        3 g 100 mL/hr over 30 Minutes Intravenous On call to O.R. 09/07/20 1009 09/07/20 1043   09/07/20 0600  ceFAZolin (ANCEF) IVPB 2g/100 mL premix        2 g 200 mL/hr over 30 Minutes Intravenous On call to O.R. 09/06/20 1651 09/07/20 0855   09/02/20 2200  cefTRIAXone (ROCEPHIN) 2 g in sodium chloride 0.9 % 100 mL IVPB        2 g 200 mL/hr over 30 Minutes Intravenous Every 24 hours 09/02/20 1044     09/01/20 2200  vancomycin (VANCOREADY) IVPB 1500 mg/300 mL  Status:  Discontinued        1,500 mg 150 mL/hr over 120 Minutes Intravenous Every 48 hours 08/31/20 0350 08/31/20 1719   09/01/20 0130  ceFEPIme (MAXIPIME) 2 g in sodium chloride 0.9 % 100 mL IVPB  Status:  Discontinued        2 g 200 mL/hr over 30 Minutes Intravenous Every 24 hours 08/31/20 1812 09/02/20 1043   09/01/20 0100  metroNIDAZOLE (FLAGYL) IVPB 500 mg  Status:  Discontinued        500 mg 100 mL/hr over 60 Minutes Intravenous Every 8 hours 08/31/20 1751 08/31/20 1926   08/31/20 2200  ceFEPIme (MAXIPIME) 2 g in sodium chloride 0.9 % 100 mL IVPB  Status:  Discontinued        2 g 200 mL/hr over 30 Minutes Intravenous Every 24 hours 08/31/20 0350 08/31/20 1719   08/31/20 2030  vancomycin (VANCOREADY) IVPB 1500 mg/300 mL        1,500 mg 150 mL/hr over 120 Minutes Intravenous  Once 08/31/20 2016 08/31/20 2338   08/31/20 2015  clindamycin (CLEOCIN) IVPB 900 mg  Status:  Discontinued        900 mg 100 mL/hr over 30 Minutes Intravenous Every 8 hours 08/31/20 1926 09/01/20 1033   08/31/20 1812  vancomycin variable dose per unstable renal function (pharmacist dosing)   Status:  Discontinued         Does not apply See admin instructions 08/31/20 1812 09/01/20 1033   08/31/20 1730  cefTRIAXone (ROCEPHIN) 2 g in sodium chloride 0.9 % 100 mL IVPB  Status:  Discontinued        2 g 200 mL/hr over 30 Minutes Intravenous Every 24 hours 08/31/20 1720 08/31/20 1751   08/31/20 1015  metroNIDAZOLE (FLAGYL) IVPB 500 mg  Status:  Discontinued        500 mg 100 mL/hr over 60 Minutes Intravenous Every 8 hours 08/31/20 1012 08/31/20 1719   08/31/20 0045  ceFEPIme (MAXIPIME) 2 g in sodium chloride 0.9 % 100 mL IVPB        2 g 200 mL/hr over 30 Minutes Intravenous  Once 08/31/20 0030 08/31/20 0208   08/31/20 0000  vancomycin (VANCOREADY) IVPB 1500 mg/300 mL        1,500 mg 150 mL/hr over 120 Minutes Intravenous  Once 08/30/20 2351 08/31/20 0208   08/30/20 1900  cefTRIAXone (ROCEPHIN) 2 g in sodium chloride 0.9 % 100 mL IVPB  Status:  Discontinued        2 g 200 mL/hr over 30 Minutes Intravenous Every 24 hours 08/30/20 1855 08/31/20 0030       Subjective: Seen and examined at bedside and he was having a lot of postop pain in his  stump.  No nausea or vomiting but states the pain felt different today than he did and he felt his whole leg was hurting.  Denies any chest pain, lightness or dizziness.  No shortness of breath.  No other concerns or complaints at this time.  . Felt okay.Objective: Vitals:   09/10/20 0815 09/10/20 0845 09/10/20 0945 09/10/20 1200  BP: 134/82 131/81 127/80 117/70  Pulse: 98 92 (!) 114 78  Resp: _0 Temp: 98.7 F (37.1 C) 98.3 F (36.8 C) 98.3 F (36.8 C) 98.3 F (36.8 C)  TempSrc: Oral Oral Oral Oral  SpO2: 99% 95% 97% 96%  Weight:      Height:        Intake/Output Summary (Last 24 hours) at 09/10/2020 1726 Last data filed at 09/10/2020 1200 Gross per 24 hour  Intake 480 ml  Output 1250 ml  Net -770 ml   Filed Weights   09/07/20 0556 09/08/20 0405 09/09/20 0406  Weight: 120.8 kg 112.3 kg 109.6 kg    Examination: Physical Exam:  Constitutional: WN/WD obese AAM in Some distress appears calm but is uncomfortable complaining of Right Leg/Stump Pain Eyes: PERRL, lids and conjunctivae normal, sclerae anicteric  ENMT: External Ears, Nose appear normal. Grossly normal hearing.  Neck: Appears normal, supple, no cervical masses, normal ROM, no appreciable thyromegaly; no JVD Respiratory: Diminished to auscultation bilaterally, no wheezing, rales, rhonchi or crackles. Normal respiratory effort and patient is not tachypenic. No accessory muscle use. Unlabored breathing  Cardiovascular: Irregularly Irregular and slightly tachycardic, no murmurs / rubs / gallops. No extremity edema.  Abdomen: Soft, non-tender, Distended 2/2 to body habitus. No masses palpated. No appreciable hepatosplenomegaly. Bowel sounds positive.  GU: Deferred. Musculoskeletal: No clubbing / cyanosis of digits/nails. Has a Left AKA  Skin: No rashes, lesions, ulcers and  Left Leg stump connected to a wound Vac. No induration; Warm and dry.  Neurologic: CN 2-12 grossly intact with no focal deficits. Romberg sign and cerebellar reflexes not assessed.  Psychiatric: Normal judgment and insight. Alert and oriented x 3. Normal mood and appropriate affect.   Data Reviewed: I have personally reviewed following labs and imaging studies  CBC: Recent Labs  Lab 09/06/20 0441 09/06/20 2107 09/07/20 0019 09/07/20 1119 09/08/20 0159 09/09/20 0531 09/09/20 1036 09/10/20 0314 09/10/20 1527  WBC 14.9*  --  14.5*  --  16.9* 16.3*  --  17.2*  --   NEUTROABS  --   --   --   --  12.1* 11.7*  --  14.7*  --   HGB 7.5*   < > 7.7*   < > 8.0* 6.9* 7.8* 6.7* 7.5*  HCT 22.1*   < > 23.8*   < > 24.7* 22.4* 23.0* 21.1* 24.1*  MCV 86.7  --  90.2  --  90.5 94.9  --  94.2  --   PLT 538*  --  487*  --  505* 462*  --  392  --    < > = values in this interval not displayed.   Basic Metabolic Panel: Recent Labs  Lab 09/06/20 0441 09/07/20 0019  09/08/20 0159 09/09/20 0108 09/09/20 1036 09/10/20 0314  NA 144 144 145 143 145 144  K 3.3* 3.5 3.6 4.6 5.3* 4.6  CL 109 109 110 109  --  109  CO2 _1 21*  --  26  GLUCOSE 99 97 116* 83  --  164*  BUN 67* 56* 40* 29*  --  28*  CREATININE 3.73* 2.84* 1.94* 1.86*  --  2.04*  CALCIUM 7.8* 7.8* 7.8* 7.7*  --  7.7*  MG  --   --  1.4*  --   --  1.6*  PHOS  --   --  3.2  --   --  3.1   GFR: Estimated Creatinine Clearance: 46.7 mL/min (A) (by C-G formula based on SCr of 2.04 mg/dL (H)). Liver Function Tests: Recent Labs  Lab 09/04/20 1446 09/07/20 0019 09/08/20 0159 09/09/20 0108 09/10/20 0314  AST 75* 38 _0 ALT 45* _1 ALKPHOS 80 73 68 64 60  BILITOT 1.3* 0.7 1.1 0.9 0.8  PROT 5.7* 6.1* 6.2* 5.9* 5.9*  ALBUMIN 1.9* 2.0* 2.1* 2.0* 2.0*   No results for input(s): LIPASE, AMYLASE in the last 168 hours. No results for input(s): AMMONIA in the last 168 hours. Coagulation Profile: No results for input(s): INR, PROTIME in the last 168 hours. Cardiac Enzymes: Recent Labs  Lab 09/06/20 0441 09/07/20 0019 09/08/20 0159 09/09/20 0108 09/10/20 0314  CKTOTAL 1,066* 803* 660* 428* 474*   BNP (last 3 results) No results for input(s): PROBNP in the last 8760 hours. HbA1C: No results for input(s): HGBA1C in the last 72 hours. CBG: Recent Labs  Lab 09/04/20 0741 09/05/20 1509 09/09/20 1120  GLUCAP 104* 116* 112*   Lipid Profile: No results for input(s): CHOL, HDL, LDLCALC, TRIG, CHOLHDL, LDLDIRECT in the last 72 hours. Thyroid Function Tests: No results for input(s): TSH, T4TOTAL, FREET4, T3FREE, THYROIDAB in the last 72 hours. Anemia Panel: No results for input(s): VITAMINB12, FOLATE, FERRITIN, TIBC, IRON, RETICCTPCT in the last 72 hours. Sepsis Labs: Recent Labs  Lab 09/04/20 1446 09/05/20 0420 09/06/20 0441  PROCALCITON 7.47 5.24 2.99    Recent Results (from the past 240 hour(s))  Culture, blood (routine x 2)     Status: None   Collection  Time: 09/03/20  5:38 PM   Specimen: BLOOD RIGHT HAND  Result Value Ref Range Status   Specimen Description BLOOD RIGHT HAND  Final   Special Requests   Final    AEROBIC BOTTLE ONLY Blood Culture results may not be optimal due to an inadequate volume of blood received in culture bottles   Culture   Final    NO GROWTH 5 DAYS Performed at [facility], [place] [address], [place], [place] [address]    Report Status 09/08/2020 FINAL  Final  Culture, blood (routine x 2)     Status: None   Collection Time: 09/03/20  5:39 PM   Specimen: BLOOD RIGHT HAND  Result Value Ref Range Status   Specimen Description BLOOD RIGHT HAND  Final   Special Requests   Final    AEROBIC BOTTLE ONLY Blood Culture results may not be optimal due to an inadequate volume of blood received in culture bottles   Culture   Final    NO GROWTH 5 DAYS Performed at [facility], [place] [address], [place], [place] [address]    Report Status 09/08/2020 FINAL  Final  Aerobic/Anaerobic Culture (surgical/deep wound)     Status: None (Preliminary result)   Collection Time: 09/07/20  9:02 AM   Specimen: Leg, Left; Tissue  Result Value Ref Range Status   Specimen Description TISSUE  Final   Special Requests LEFT THIGH FACIA SPEC A  Final   Gram Stain NO WBC SEEN NO ORGANISMS SEEN   Final   Culture   Final    NO  GROWTH 3 DAYS NO ANAEROBES ISOLATED; CULTURE IN PROGRESS FOR 5 DAYS Performed at [facility], [place] [address] [place], [place] [address]    Report Status PENDING  Incomplete  Aerobic/Anaerobic Culture (surgical/deep wound)     Status: None (Preliminary result)   Collection Time: 09/07/20  9:05 AM   Specimen: Leg, Left; Tissue  Result Value Ref Range Status   Specimen Description TISSUE  Final   Special Requests LEFT CALF MUSCLE SPEC B  Final   Gram Stain   Final    RARE WBC PRESENT, PREDOMINANTLY MONONUCLEAR NO ORGANISMS SEEN    Culture   Final    NO GROWTH 3 DAYS NO ANAEROBES ISOLATED; CULTURE  IN PROGRESS FOR 5 DAYS Performed at [facility], [place] [address] [place], [place] [address]    Report Status PENDING  Incomplete  Surgical pcr screen     Status: None   Collection Time: 09/09/20  4:22 AM   Specimen: Nasal Mucosa; Nasal Swab  Result Value Ref Range Status   MRSA, PCR NEGATIVE NEGATIVE Final   Staphylococcus aureus NEGATIVE NEGATIVE Final    Comment: (NOTE) The Xpert SA Assay (FDA approved for NASAL specimens in patients 65 years of age and older), is one component of a comprehensive surveillance program. It is not intended to diagnose infection nor to guide or monitor treatment. Performed at [facility], [place] [address], [place], [place] [address]     RN Pressure Injury Documentation:     Estimated body mass index is 33.7 kg/m as calculated from the following:   Height as of this encounter: _0  (1.803 m).   Weight as of this encounter: 109.6 kg.  Malnutrition Type:   Malnutrition Characteristics:   Nutrition Interventions    Radiology Studies: No results found.  Scheduled Meds: . aspirin EC  81 mg Oral Daily  . diltiazem  180 mg Oral Daily  . feeding supplement  237 mL Oral BID BM  . gabapentin  300 mg Oral TID  . labetalol  200 mg Oral BID  . mouth rinse  15 mL Mouth Rinse BID  . sodium chloride flush  10-40 mL Intracatheter Q12H   Continuous Infusions: . sodium chloride 75 mL/hr at 09/10/20 0138  . cefTRIAXone (ROCEPHIN)  IV 2 g (09/10/20 0300)  . linezolid (ZYVOX) IV 600 mg (09/10/20 1001)    LOS: 11 days   Kerney Elbe, DO [facility] PAGER is on [place]  If 7PM-7AM, please contact night-coverage [email or web address]

## 2020-09-10 NOTE — Progress Notes (Signed)
Physical Therapy Treatment Patient Details Name: Connor [name] MRN: [id number] DOB: 1956-01-15 Today's Date: 09/10/2020    History of Present Illness 65 yo male presenting originally for nausea/vomiting, fever, and LLE pain and swelling. Pt found to have sepsis and rhabdomyolysis stemming from LLE cellulitis/myofascitis. 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  Had revision of surgery on 1/14, now re assessed by PT.  PMH includes: AAA, CABG, and HTN.    PT Comments    Pt is up to side of bed to stand with no steps, but is able to control R knee with cues and bed rail.  He is in less pain after getting meds, now feeling up to repetitive standing and having O2 sats in 90's.  Pt will still be recommended to CIR for rehab, and will continue to encourage him to be OOB and in chair for increasing strength and endurance.  Follow along for goals of acute PT.   Follow Up Recommendations  CIR     Equipment Recommendations  Rolling walker with 5" wheels    Recommendations for Other Services Rehab consult     Precautions / Restrictions Precautions Precautions: Fall Precaution Comments: LLE wound vac Restrictions LLE Weight Bearing: Non weight bearing Other Position/Activity Restrictions: minimal elevation of LLE in bed    Mobility  Bed Mobility Overal bed mobility: Needs Assistance Bed Mobility: Supine to Sit;Sit to Supine Rolling: Min assist Sidelying to sit: Min assist Supine to sit: Min assist Sit to supine: Min assist Sit to sidelying: Min assist    Transfers Overall transfer level: Needs assistance Equipment used: Rolling walker (2 wheeled);1 person hand held assist Transfers: Sit to/from Stand Sit to Stand: Min assist;Mod assist         General transfer comment: initial effort was mod assist then min assist with practice on side of bed with RW  Ambulation/Gait             General Gait Details: declines   Stairs             Wheelchair  Mobility    Modified Rankin (Stroke Patients Only)       Balance Overall balance assessment: Needs assistance Sitting-balance support: Single extremity supported Sitting balance-Leahy Scale: Fair     Standing balance support: Bilateral upper extremity supported;During functional activity Standing balance-Leahy Scale: Poor                              Cognition Arousal/Alertness: Awake/alert Behavior During Therapy: WFL for tasks assessed/performed Overall Cognitive Status: Within Functional Limits for tasks assessed                           Safety/Judgement: Decreased awareness of safety Awareness: Intellectual   General Comments: pt is feeling very confident about standing but is able to support on R knee with supervision to min assist on RW      Exercises      General Comments General comments (skin integrity, edema, etc.): pt was seen for initial standing and has lines that hinder control of balance, IV and wound vac.  Has received transfusion today      Pertinent Vitals/Pain Pain Assessment: Faces Faces Pain Scale: Hurts little more Pain Location: L LE Pain Descriptors / Indicators: Guarding;Operative site guarding Pain Intervention(s): Limited activity within patient's tolerance;Premedicated before session;Repositioned    Home Living  Prior Function            PT Goals (current goals can now be found in the care plan section) Acute Rehab PT Goals Patient Stated Goal: pain relief Progress towards PT goals: Progressing toward goals    Frequency    Min 3X/week      PT Plan Current plan remains appropriate    Co-evaluation              AM-PAC PT "6 Clicks" Mobility   Outcome Measure  Help needed turning from your back to your side while in a flat bed without using bedrails?: A Little Help needed moving from lying on your back to sitting on the side of a flat bed without using bedrails?: A  Little Help needed moving to and from a bed to a chair (including a wheelchair)?: A Little Help needed standing up from a chair using your arms (e.g., wheelchair or bedside chair)?: A Little Help needed to walk in hospital room?: Total Help needed climbing 3-5 steps with a railing? : Total 6 Click Score: 14    End of Session Equipment Utilized During Treatment: Gait belt Activity Tolerance: Treatment limited secondary to medical complications (Comment) Patient left: in bed;with call bell/phone within reach;with bed alarm set Nurse Communication: Mobility status PT Visit Diagnosis: Unsteadiness on feet (R26.81);Other abnormalities of gait and mobility (R26.89);Muscle weakness (generalized) (M62.81)     Time: 3009-2330 PT Time Calculation (min) (ACUTE ONLY): 23 min  Charges:  $Therapeutic Exercise: 8-22 mins $Therapeutic Activity: 8-22 mins                 Connor [name] 09/10/2020, 3:41 PM  Samul Dada, PT MS Acute Rehab Dept. Number: [facility] [id number] and [facility] [phone]

## 2020-09-10 NOTE — Progress Notes (Signed)
Progress Note  Patient Name: Tashi Band Date of Encounter: 09/10/2020  [facility] Cardiologist: Little Ishikawa, MD   Subjective   No CP or dyspnea; Pain at surgical site improved but persists  Inpatient Medications    Scheduled Meds: . aspirin EC  81 mg Oral Daily  . diltiazem  30 mg Oral Q6H  . feeding supplement  237 mL Oral BID BM  . gabapentin  300 mg Oral TID  . labetalol  200 mg Oral BID  . mouth rinse  15 mL Mouth Rinse BID  . sodium chloride flush  10-40 mL Intracatheter Q12H   Continuous Infusions: . sodium chloride 75 mL/hr at 09/10/20 0138  . cefTRIAXone (ROCEPHIN)  IV 2 g (09/10/20 0300)  . linezolid (ZYVOX) IV 600 mg (09/10/20 0141)   PRN Meds: hydrALAZINE, HYDROmorphone (DILAUDID) injection, metoCLOPramide **OR** metoCLOPramide (REGLAN) injection, ondansetron **OR** ondansetron (ZOFRAN) IV, oxyCODONE, sodium chloride flush   Vital Signs    Vitals:   09/09/20 1655 09/09/20 1740 09/09/20 2100 09/10/20 0500  BP: 112/78 108/72 132/82 116/78  Pulse: 85 79 100 91  Resp:   18 18  Temp:   99.2 F (37.3 C) 98.4 F (36.9 C)  TempSrc:   Oral Oral  SpO2: 94% 94% 100% 98%  Weight:      Height:        Intake/Output Summary (Last 24 hours) at 09/10/2020 0736 Last data filed at 09/10/2020 0500 Gross per 24 hour  Intake 715 ml  Output 600 ml  Net 115 ml   Last 3 Weights 09/09/2020 09/08/2020 09/07/2020  Weight (lbs) 241 lb 10 oz 247 lb 9.6 oz 266 lb 5.1 oz  Weight (kg) 109.6 kg 112.311 kg 120.8 kg      Telemetry    Atrial fibrillation rate upper normal - Personally Reviewed  Physical Exam   GEN: WD WN No acute distress Neck: supple Cardiac: irregular, mildly tachycardic, no murmur Respiratory: CTA; no rhonchi GI: Soft, NT/ND, no masses MS: s/p left AKA Neuro:  No focal findings  Labs    High Sensitivity Troponin:   Recent Labs  Lab 08/30/20 1926 08/30/20 2215 09/06/20 2147 09/07/20 0019  TROPONINIHS 425* 482* 32* 33*       Chemistry Recent Labs  Lab 09/08/20 0159 09/09/20 0108 09/09/20 1036 09/10/20 0314  NA 145 143 145 144  K 3.6 4.6 5.3* 4.6  CL 110 109  --  109  CO2 24 21*  --  26  GLUCOSE 116* 83  --  164*  BUN 40* 29*  --  28*  CREATININE 1.94* 1.86*  --  2.04*  CALCIUM 7.8* 7.7*  --  7.7*  PROT 6.2* 5.9*  --  5.9*  ALBUMIN 2.1* 2.0*  --  2.0*  AST 31 31  --  24  ALT 28 21  --  17  ALKPHOS 68 64  --  60  BILITOT 1.1 0.9  --  0.8  GFRNONAA 38* 40*  --  36*  ANIONGAP 11 13  --  9     Hematology Recent Labs  Lab 09/08/20 0159 09/09/20 0531 09/09/20 1036 09/10/20 0314  WBC 16.9* 16.3*  --  17.2*  RBC 2.73* 2.36*  --  2.24*  HGB 8.0* 6.9* 7.8* 6.7*  HCT 24.7* 22.4* 23.0* 21.1*  MCV 90.5 94.9  --  94.2  MCH 29.3 29.2  --  29.9  MCHC 32.4 30.8  --  31.8  RDW 16.4* 17.1*  --  16.8*  PLT  505* 462*  --  392    Patient Profile     65 y.o. male with past medical history aortic dissection repair, previous cardiomyopathy improved, persistent atrial fibrillation, hypertension admitted with diarrhea, leg pain with fasciitis, fever.  Found to be bacteremic and also in atrial fibrillation with elevated rate.  Cardiology asked to evaluate.  Echocardiogram shows ejection fraction 60 to 65%, severe left ventricular hypertrophy, grade 2 diastolic dysfunction, biatrial enlargement. Had AKA for necrotizing fasciitis.  Assessment & Plan    1 bacteremia/necrotizing fasciititis-previous TEE showed no vegetation. Continue antibiotics for cellulitis/necrotizing fasciitis per ID.  2 persistent atrial fibrillation-heart rate mildly elevated likely driven by recent surgery and anemia.  Will change to cardizem CD 180 mg daily and continue labetalol.  He is essentially asymptomatic and rate control and anticoagulation will likely be best option long-term. Heparin on hold due to anemia.  Would resume apixaban when able.    3 acute kidney injury-renal function improved compared to admission.  Likely secondary  to rhabdomyolysis.  4 s/p left AKA-per orthopedics  5 history of aortic arch dissection repair-needs good blood pressure control.  6 hypertension-continue labetalol and increase Cardizem.  Follow blood pressure and adjust regimen as needed.  7 anemia-transfusion per primary service.  We will see again Monday.  Please call with questions prior to then.  For questions or updates, please contact [facility] Please consult [email or web address] for contact info under     Signed, Olga Millers, MD  09/10/2020, 7:36 AM

## 2020-09-10 NOTE — Progress Notes (Signed)
Subjective: 1 Day Post-Op Procedure(s) (LRB): REVISION LEFT ABOVE KNEE AMPUTATION (Left) Patient reports pain as moderate.    Objective: Vital signs in last 24 hours: Temp:  [96.9 F (36.1 C)-99.2 F (37.3 C)] 98.7 F (37.1 C) (01/15 0815) Pulse Rate:  [76-114] 98 (01/15 0815) Resp:  [13-19] 18 (01/15 0815) BP: (103-165)/(72-103) 134/82 (01/15 0815) SpO2:  [93 %-100 %] 99 % (01/15 0815)  Intake/Output from previous day: 01/14 0701 - 01/15 0700 In: 955 [P.O.:240; I.V.:400; Blood:315] Out: 800 [Urine:700; Blood:100] Intake/Output this shift: No intake/output data recorded.  Recent Labs    09/07/20 1119 09/08/20 0159 09/09/20 0531 09/09/20 1036 09/10/20 0314  HGB 8.5* 8.0* 6.9* 7.8* 6.7*   Recent Labs    09/09/20 0531 09/09/20 1036 09/10/20 0314  WBC 16.3*  --  17.2*  RBC 2.36*  --  2.24*  HCT 22.4* 23.0* 21.1*  PLT 462*  --  392   Recent Labs    09/09/20 0108 09/09/20 1036 09/10/20 0314  NA 143 145 144  K 4.6 5.3* 4.6  CL 109  --  109  CO2 21*  --  26  BUN 29*  --  28*  CREATININE 1.86*  --  2.04*  GLUCOSE 83  --  164*  CALCIUM 7.7*  --  7.7*   No results for input(s): LABPT, INR in the last 72 hours.  Neurologically intact Wound vac in place and functioning properly.  No fluid in canister  Assessment/Plan: 1 Day Post-Op Procedure(s) (LRB): REVISION LEFT ABOVE KNEE AMPUTATION (Left) Up with therapy NWB LLE Continue wound vac Continue plan per primary F/u with Dr. Lajoyce Corners in one week     Cristie Hem 09/10/2020, 8:34 AM

## 2020-09-10 NOTE — Progress Notes (Signed)
PT Cancellation Note  Patient Details Name: Connor [name] MRN: [id number] DOB: [birth year]   Cancelled Treatment:    Reason Eval/Treat Not Completed: Pain limiting ability to participate;Patient at procedure or test/unavailable.  Pt is having a lot of pain and is working on transfusion.  Will reattempt later.     Ivar Drape 09/10/2020, 11:48 AM   Samul Dada, PT MS Acute Rehab Dept. Number: [facility] [id number] and [facility] [phone]

## 2020-09-10 NOTE — Progress Notes (Signed)
[facility]   Reason for visit: Follow up on necrotizing fasciitis  Interval History: cultures from the OR remain no growth. Afebrile and WBC stable at 17.2.  Went back to the OR yesterday, found viable tissue.  Some excision of skin, muscle and fascia further down to healthy viable tissue.  Some revision of the femur.   Main complaint continues to be pain    Physical Exam: Constitutional:  Vitals:   09/10/20 0945 09/10/20 1200  BP: 127/80 117/70  Pulse: (!) 114 78  Resp: 18 18  Temp: 98.3 F (36.8 C) 98.3 F (36.8 C)  SpO2: 97% 96%   patient appears in NAD Respiratory: Normal respiratory effort; CTA B Cardiovascular: RRR MS: Vac in place  Review of Systems: Constitutional: negative for fevers and chills Gastrointestinal: negative for nausea and diarrhea Integument/breast: negative for rash  Lab Results  Component Value Date   WBC 17.2 (H) 09/10/2020   HGB 6.7 (LL) 09/10/2020   HCT 21.1 (L) 09/10/2020   MCV 94.2 09/10/2020   PLT 392 09/10/2020    Lab Results  Component Value Date   CREATININE 2.04 (H) 09/10/2020   BUN 28 (H) 09/10/2020   NA 144 09/10/2020   K 4.6 09/10/2020   CL 109 09/10/2020   CO2 26 09/10/2020    Lab Results  Component Value Date   ALT 17 09/10/2020   AST 24 09/10/2020   ALKPHOS 60 09/10/2020     Microbiology: Recent Results (from the past 240 hour(s))  Culture, blood (routine x 2)     Status: None   Collection Time: 09/03/20  5:38 PM   Specimen: BLOOD RIGHT HAND  Result Value Ref Range Status   Specimen Description BLOOD RIGHT HAND  Final   Special Requests   Final    AEROBIC BOTTLE ONLY Blood Culture results may not be optimal due to an inadequate volume of blood received in culture bottles   Culture   Final    NO GROWTH 5 DAYS Performed at [facility], [address] [place], [place] [address]    Report Status 09/08/2020 FINAL  Final  Culture, blood (routine x 2)     Status: None    Collection Time: 09/03/20  5:39 PM   Specimen: BLOOD RIGHT HAND  Result Value Ref Range Status   Specimen Description BLOOD RIGHT HAND  Final   Special Requests   Final    AEROBIC BOTTLE ONLY Blood Culture results may not be optimal due to an inadequate volume of blood received in culture bottles   Culture   Final    NO GROWTH 5 DAYS Performed at [facility], [address], [place], [place] [address]    Report Status 09/08/2020 FINAL  Final  Aerobic/Anaerobic Culture (surgical/deep wound)     Status: None (Preliminary result)   Collection Time: 09/07/20  9:02 AM   Specimen: Leg, Left; Tissue  Result Value Ref Range Status   Specimen Description TISSUE  Final   Special Requests LEFT THIGH FACIA SPEC A  Final   Gram Stain NO WBC SEEN NO ORGANISMS SEEN   Final   Culture   Final    NO GROWTH 2 DAYS NO ANAEROBES ISOLATED; CULTURE IN PROGRESS FOR 5 DAYS Performed at [facility], [address] [place], [place] [address]    Report Status PENDING  Incomplete  Aerobic/Anaerobic Culture (surgical/deep wound)     Status: None (Preliminary result)   Collection  Time: 09/07/20  9:05 AM   Specimen: Leg, Left; Tissue  Result Value Ref Range Status   Specimen Description TISSUE  Final   Special Requests LEFT CALF MUSCLE SPEC B  Final   Gram Stain   Final    RARE WBC PRESENT, PREDOMINANTLY MONONUCLEAR NO ORGANISMS SEEN    Culture   Final    NO GROWTH 3 DAYS NO ANAEROBES ISOLATED; CULTURE IN PROGRESS FOR 5 DAYS Performed at [facility], [address] [place], [place] [address]    Report Status PENDING  Incomplete  Surgical pcr screen     Status: None   Collection Time: 09/09/20  4:22 AM   Specimen: Nasal Mucosa; Nasal Swab  Result Value Ref Range Status   MRSA, PCR NEGATIVE NEGATIVE Final   Staphylococcus aureus NEGATIVE NEGATIVE Final    Comment: (NOTE) The Xpert SA Assay (FDA approved for NASAL specimens in patients 35 years of age and older), is one component  of a comprehensive surveillance program. It is not intended to diagnose infection nor to guide or monitor treatment. Performed at [facility], [address], [place], [place] [address]     Impression/Plan:  1. Necrotizing fasciitis - no new culture growth from the OR.  Previous culture with Group G Strep.  S/p amputation and source control.   At this point, can continue with IV ceftriaxone while inpatient and at discharge can use oral amoxicillin 500 mg TID through 09/16/20 and then stop.   2.  Afib - followed by cardiology and on Cardizem now.  Stable at this time.   3.  AKI - creat a bit more elevated today but overall improved.  Likely secondary to rhabdomyolysis.  BUN continues to improve.    I will sign off, call with any questions

## 2020-09-11 DIAGNOSIS — L03116 Cellulitis of left lower limb: Secondary | ICD-10-CM | POA: Diagnosis not present

## 2020-09-11 DIAGNOSIS — A409 Streptococcal sepsis, unspecified: Secondary | ICD-10-CM | POA: Diagnosis not present

## 2020-09-11 DIAGNOSIS — N179 Acute kidney failure, unspecified: Secondary | ICD-10-CM | POA: Diagnosis not present

## 2020-09-11 DIAGNOSIS — N1831 Chronic kidney disease, stage 3a: Secondary | ICD-10-CM | POA: Diagnosis not present

## 2020-09-11 LAB — COMPREHENSIVE METABOLIC PANEL
ALT: 16 U/L (ref 0–44)
AST: 22 U/L (ref 15–41)
Albumin: 2.1 g/dL — ABNORMAL LOW (ref 3.5–5.0)
Alkaline Phosphatase: 53 U/L (ref 38–126)
Anion gap: 10 (ref 5–15)
BUN: 24 mg/dL — ABNORMAL HIGH (ref 8–23)
CO2: 25 mmol/L (ref 22–32)
Calcium: 7.3 mg/dL — ABNORMAL LOW (ref 8.9–10.3)
Chloride: 110 mmol/L (ref 98–111)
Creatinine, Ser: 1.9 mg/dL — ABNORMAL HIGH (ref 0.61–1.24)
GFR, Estimated: 39 mL/min — ABNORMAL LOW (ref >60)
Glucose, Bld: 120 mg/dL — ABNORMAL HIGH (ref 70–99)
Potassium: 3.9 mmol/L (ref 3.5–5.1)
Sodium: 145 mmol/L (ref 135–145)
Total Bilirubin: 0.7 mg/dL (ref 0.3–1.2)
Total Protein: 6.1 g/dL — ABNORMAL LOW (ref 6.5–8.1)

## 2020-09-11 LAB — BPAM RBC
Blood Product Expiration Date: 202202132359
Blood Product Expiration Date: 202202162359
ISSUE DATE / TIME: 202201140836
ISSUE DATE / TIME: 202201150822
Unit Type and Rh: 5100
Unit Type and Rh: 5100

## 2020-09-11 LAB — TYPE AND SCREEN
ABO/RH(D): O POS
Antibody Screen: NEGATIVE
Unit division: 0
Unit division: 0

## 2020-09-11 LAB — CBC WITH DIFFERENTIAL/PLATELET
Abs Immature Granulocytes: 0.4 10*3/uL — ABNORMAL HIGH (ref 0.00–0.07)
Basophils Absolute: 0.1 10*3/uL (ref 0.0–0.1)
Basophils Relative: 0 %
Eosinophils Absolute: 0.1 10*3/uL (ref 0.0–0.5)
Eosinophils Relative: 1 %
HCT: 22.5 % — ABNORMAL LOW (ref 39.0–52.0)
Hemoglobin: 7.3 g/dL — ABNORMAL LOW (ref 13.0–17.0)
Immature Granulocytes: 3 %
Lymphocytes Relative: 13 %
Lymphs Abs: 1.9 10*3/uL (ref 0.7–4.0)
MCH: 31.2 pg (ref 26.0–34.0)
MCHC: 32.4 g/dL (ref 30.0–36.0)
MCV: 96.2 fL (ref 80.0–100.0)
Monocytes Absolute: 1 10*3/uL (ref 0.1–1.0)
Monocytes Relative: 7 %
Neutro Abs: 11.1 10*3/uL — ABNORMAL HIGH (ref 1.7–7.7)
Neutrophils Relative %: 76 %
Platelets: 268 10*3/uL (ref 150–400)
RBC: 2.34 MIL/uL — ABNORMAL LOW (ref 4.22–5.81)
RDW: 16.9 % — ABNORMAL HIGH (ref 11.5–15.5)
WBC: 14.6 10*3/uL — ABNORMAL HIGH (ref 4.0–10.5)
nRBC: 0.5 % — ABNORMAL HIGH (ref 0.0–0.2)

## 2020-09-11 LAB — PHOSPHORUS: Phosphorus: 3.4 mg/dL (ref 2.5–4.6)

## 2020-09-11 LAB — CK: Total CK: 390 U/L (ref 49–397)

## 2020-09-11 LAB — MAGNESIUM: Magnesium: 1.4 mg/dL — ABNORMAL LOW (ref 1.7–2.4)

## 2020-09-11 MED ORDER — METHOCARBAMOL 1000 MG/10ML IJ SOLN
500.0000 mg | Freq: Four times a day (QID) | INTRAVENOUS | Status: DC | PRN
  Administered 2020-09-11: 500 mg via INTRAVENOUS
  Filled 2020-09-11 (×2): qty 5

## 2020-09-11 MED ORDER — MAGNESIUM SULFATE 2 GM/50ML IV SOLN
2.0000 g | Freq: Once | INTRAVENOUS | Status: AC
  Administered 2020-09-11: 2 g via INTRAVENOUS
  Filled 2020-09-11: qty 50

## 2020-09-11 MED ORDER — METHOCARBAMOL 1000 MG/10ML IJ SOLN: 500.0000 mg | Freq: Four times a day (QID) | INTRAMUSCULAR | Status: DC | PRN

## 2020-09-11 MED ORDER — METHOCARBAMOL 1000 MG/10ML IJ SOLN
500.0000 mg | Freq: Four times a day (QID) | INTRAMUSCULAR | Status: DC | PRN
  Filled 2020-09-11 (×2): qty 5

## 2020-09-11 NOTE — Progress Notes (Signed)
Occupational Therapy Treatment Patient Details Name: Connor [name] MRN: [id number] DOB: 1956/06/08 Today's Date: 09/11/2020    History of present illness 65 yo male presenting originally for nausea/vomiting, fever, and LLE pain and swelling. Pt found to have sepsis and rhabdomyolysis stemming from LLE cellulitis/myofascitis. 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  Had revision of surgery on 1/14, now re assessed by PT.  PMH includes: AAA, CABG, and HTN.   OT comments  Pt progressing slowly  With OT. Pt performing ADL tasks sitting at EOB. Pt minA overal to EOB, assist to scoot hips to EOB. Pt modA for power up to standing with bed elevated and RW in front. Pt's initial standing balance is minA to modA and then minguardA for taking steps towards HOB, forwards, backward and side to side with RW. Pt able to hop on RLE< but tires very easily. Pt would benefit from continued OT skilled services. OT following acutely. Pt a great candiate for CIR as he is motivated to return to PLOF and requires modA overall for short bouts of mobility. Pt's HOR ~103 BPM with exertion, Pt  >90% O2 on RA.    Follow Up Recommendations  CIR;Supervision/Assistance - 24 hour    Equipment Recommendations  3 in 1 bedside commode;Wheelchair (measurements OT);Wheelchair cushion (measurements OT)    Recommendations for Other Services Rehab consult    Precautions / Restrictions Precautions Precautions: Fall Precaution Comments: LLE wound vac Restrictions Weight Bearing Restrictions: Yes LLE Weight Bearing: Non weight bearing       Mobility Bed Mobility Overal bed mobility: Needs Assistance Bed Mobility: Supine to Sit;Sit to Supine Rolling: Supervision Sidelying to sit: Min assist Supine to sit: Min assist     General bed mobility comments: MinA for safety and assist to move LLE towards EOB  Transfers Overall transfer level: Needs assistance Equipment used: Rolling walker (2  wheeled) Transfers: Sit to/from Stand Sit to Stand: Mod assist;From elevated surface         General transfer comment: initial standing balance is minA to modA and then minguardA for taking steps towards HOB; elevated bed    Balance Overall balance assessment: Needs assistance Sitting-balance support: Single extremity supported Sitting balance-Leahy Scale: Fair     Standing balance support: Bilateral upper extremity supported;During functional activity Standing balance-Leahy Scale: Poor Standing balance comment: with RW               High Level Balance Comments: with RW           ADL either performed or assessed with clinical judgement   ADL Overall ADL's : Needs assistance/impaired Eating/Feeding: Set up;Sitting                       Toilet Transfer: Minimal assistance;Cueing for safety;Ambulation;RW Toilet Transfer Details (indicate cue type and reason): Pt minA for standing at EOB taking steps forward and side to side Toileting- Clothing Manipulation and Hygiene: Total assistance;Bed level Toileting - Clothing Manipulation Details (indicate cue type and reason): Pt unable to properly clean self     Functional mobility during ADLs: Minimal assistance;Cueing for safety;Cueing for sequencing;Rolling walker General ADL Comments: Pt performing EOB grooming tasks. Pt following commands with delayed processing and cues to intiate.     Vision       Perception     Praxis      Cognition Arousal/Alertness: Awake/alert Behavior During Therapy: WFL for tasks assessed/performed Overall Cognitive Status: No family/caregiver present to determine baseline cognitive functioning  Safety/Judgement: Decreased awareness of safety Awareness: Intellectual Problem Solving: Decreased initiation;Slow processing General Comments: Pt requiring increased time to complete a task; able to follow 1-2 step commands        Exercises  Exercises: Other exercises Other Exercises Other Exercises: sit to stands from bed x3 times   Shoulder Instructions       General Comments Pt's HOR ~103 BPM with exertion, Pt  >90% O2 on RA.    Pertinent Vitals/ Pain       Pain Assessment: Faces Faces Pain Scale: Hurts a little bit Pain Location: L LE Pain Descriptors / Indicators: Discomfort Pain Intervention(s): Monitored during session;Premedicated before session  Home Living                                          Prior Functioning/Environment              Frequency  Min 2X/week        Progress Toward Goals  OT Goals(current goals can now be found in the care plan section)  Progress towards OT goals: Progressing toward goals  Acute Rehab OT Goals Patient Stated Goal: pain relief OT Goal Formulation: With patient Time For Goal Achievement: 09/22/20 Potential to Achieve Goals: Fair ADL Goals Pt Will Perform Grooming: with supervision;sitting Pt Will Perform Lower Body Bathing: with min assist;sitting/lateral leans Pt Will Perform Lower Body Dressing: with min assist;sitting/lateral leans Pt Will Transfer to Toilet: with min assist;anterior/posterior transfer;squat pivot transfer Pt/caregiver will Perform Home Exercise Program: Increased strength;Both right and left upper extremity;With theraband Additional ADL Goal #1: Pt will perform bed mobility with supervision in preparation for ADL. Additional ADL Goal #2: Pt will demonstrate fair sitting balance EOB.  Plan Discharge plan needs to be updated    Co-evaluation                 AM-PAC OT "6 Clicks" Daily Activity     Outcome Measure   Help from another person eating meals?: None Help from another person taking care of personal grooming?: A Little Help from another person toileting, which includes using toliet, bedpan, or urinal?: Total Help from another person bathing (including washing, rinsing, drying)?: A Lot Help from  another person to put on and taking off regular upper body clothing?: A Lot Help from another person to put on and taking off regular lower body clothing?: Total 6 Click Score: 13    End of Session Equipment Utilized During Treatment: Gait belt;Rolling walker  OT Visit Diagnosis: Pain;Muscle weakness (generalized) (M62.81);Other symptoms and signs involving cognitive function Pain - Right/Left: Left Pain - part of body: Leg   Activity Tolerance Patient tolerated treatment well   Patient Left in bed;with call bell/phone within reach;with bed alarm set   Nurse Communication Patient requests pain meds        Time: 1200-1230 OT Time Calculation (min): 30 min  Charges: OT General Charges $OT Visit: 1 Visit OT Treatments $Self Care/Home Management : 8-22 mins $Therapeutic Activity: 8-22 mins  Flora Lipps, OTR/L Acute Rehabilitation Services Pager: [phone] Office: [phone]    ALLISON C 09/11/2020, 2:40 PM

## 2020-09-11 NOTE — Progress Notes (Signed)
Inpatient Rehab Admissions Coordinator Note:   Per therapy recommendations, pt was screened for CIR candidacy by Tessla Spurling, MS CCC-SLP. At this time, Pt. Appears to have functional decline and is a good candidate for CIR. Will place order for rehab consult per protocol.  Please contact me with questions.   Ventura Leggitt, MS, CCC-SLP Rehab Admissions Coordinator  [phone] (celll) [phone] (office)  

## 2020-09-11 NOTE — Progress Notes (Signed)
PROGRESS NOTE    Connor [name]  TWS:[id number] DOB: 04/21/1956 DOA: 08/30/2020 PCP: Patient, No Pcp Per   Brief Narrative:  Connor [name] is a 65 y.o. male with PMH significant for HTN, CKD 3, A. 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  Patient presented to the ED on 1/4 with 1 week history of nausea, vomiting, chills followed by worsening left leg pain and swelling.  In the ED, he was noted to be in severe sepsis with WBC count elevated to 15, lactic acid 2, creatinine 4.2 with a baseline of 1.3, potassium low at 2.8.  CK level was elevated more than 28,000. Ultrasound rule out DVT.   CT scan of left leg showed soft tissue swelling but no muscle damage and no signs of abscess although limited due to lack of contrast and CT due to renal function.  MRI noted signs of myofascitis.  Patient was admitted to hospitalist service for cellulitis, rhabdomyolysis, AKI. Orthopedic and nephrology consultation were obtained. Blood cultures sent on admission grew Streptococcus group G, cellulitis likely being the source.    Over the course of hospitalization, patient had improvement in parameters for sepsis, rhabdomyolysis, AKI.  However his left leg continued to remain swollen and tender. Orthopedic consultation was obtained with Dr. Sharol Given. 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  09/10/2020 his blood count dropped again in 1-6.7 so we will type and screen H&H is 1 unit of PRBCs.  After infusion his hemoglobin appropriate responded to 7.5 -He continues to have pain so we will add Robaxin IV.  CR feels he is a good candidate given his functional decline and they have placed a rehab consult and if  approved he will go to CIR.  Assessment & Plan:   Principal Problem:   Severe sepsis with acute organ dysfunction due to Streptococcus species [facility]) Active Problems:   Persistent atrial fibrillation (HCC)   Hypertension   Cellulitis of left leg   Lactic acidosis   Elevated troponin   Hypoalbuminemia   Leukocytosis   Thrombocytopenia (HCC)   Hypokalemia   Hyperglycemia   Transaminitis   Total bilirubin, elevated   AKI (acute kidney injury) (HCC)   Nausea vomiting and diarrhea   CKD (chronic kidney disease), stage III (HCC)   Morbid obesity (HCC)   Sepsis with acute renal failure without septic shock ([place])   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)  Necrotizing fasciitis of left leg -Initially presented as left leg cellulitis.  Also had evidence of myositis on MRI and rhabdomyolysis. -Sepsis parameters improved with antibiotics but left leg continues to remain swollen and tender. -Orthopedic consultation was obtained with Dr. Sharol Given. 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 -Currently on gabapentin and Dilaudid 1 mg IV every 2 hours as needed for pain control and p.o. oxycodone IR 10 mg every 4 hours as needed. May need adjustment -Underwent Revision of AKA 09/09/2020 -Previous culture showed group B strep and he is status post amputation for source control.  ID is now recommending continue IV vancomycin while inpatient and discharged changed to oral amoxicillin 5 mg 3 times daily through 09/16/2020 and stopping antibiotics then -Continue  pain control and have added IV Robaxin  Bacteremia with Streptococcus group G -Left leg cellulitis/myositis probably the source.   -Currently on IV Rocephin.  Repeat blood culture from 1/8 did not show any growth.   -TEE 1/10 did not show any evidence of vegetation.  -ID consult appreciated.  We will continue IV ceftriaxone as an inpatient and  at discharge will transition to p.o. amoxicillin 500 mg 3 times daily through 09/16/2020  Severe sepsis - POA -Secondary to cellulitis.  Sepsis resolved.   -WBC count slightly bumped yesterdayin response to surgery and is now down to 16.3 Last Labs              Recent Labs  Lab 09/01/20 1026 09/01/20 1538 09/02/20 0416 09/03/20 0419 09/04/20 1446 09/05/20 0420 09/06/20 0441 09/07/20 0019 09/08/20 0159  WBC  --    < > 22.8* 25.8* 20.4* 18.9* 14.9* 14.5* 16.9*  LATICACIDVEN 0.9  --   --   --   --   --   --   --   --   PROCALCITON 43.41  --  34.97 20.62 7.47 5.24 2.99  --   --    < > = values in this interval not displayed.    -BC trended up to 17.2 on 09/10/2020 and is now trending back down to 14.6 today  Acute kidney injury -Baseline creatinine less than 1.4.  Patient with elevated creatinine of 4.2, peaked at 7.6 on 1/7.  AKI to be due to acute tubular injury and decreased renal perfusion in the setting of rhabdomyolysis. Creatinine improving gradually.  Currently being monitored off fluid. -BUN/Cr is improving and has gone from 29/1.86 -> 28/2.04 -> 24/1.90  Rhabdomyolysis  Elevated transaminases -Secondary to left leg myofascitis -CK level and transaminases level are trending down as below with IV fluid. -Currently off IV fluid Abx Last Labs            Recent Labs  Lab 09/02/20 0416 09/03/20 0419 09/04/20 1446 09/05/20 0420 09/06/20 0441 09/07/20 0019 09/08/20 0159  CKTOTAL 54,270* 8,688* 3,244* 2,084* 1,066* 803* 660*    -CAD now normalized and trended down to 474 and is now 390 today.  Hepatic Function Latest Ref Rng & Units 09/11/2020 09/10/2020 09/09/2020  Total Protein 6.5 - 8.1 g/dL 6.1(L) 5.9(L) 5.9(L)  Albumin 3.5 - 5.0 g/dL 2.1(L) 2.0(L) 2.0(L)  AST 15 - 41 U/L 22 24 31   ALT 0 - 44 U/L 16 17 21   Alk Phosphatase 38 - 126 U/L 53 60 64  Total Bilirubin 0.3 - 1.2 mg/dL 0.7 0.8 0.9  Bilirubin, Direct 0.0 - 0.2 mg/dL - - 0.3(H)   Acute anemia/acute blood  loss anemia -Unclear cause. No active bleeding but hemoglobin is running low. -received a total of 5 units of PRBCs this hospital stay will get 1 today) -Hemoglobin further down to 8 yesterday morning, probably due to surgical blood loss.  Continue to monitor. -Iron, ferritin level normal.  Probably not suggestive of chronic GI bleeding. -Hb/Hct went from 8.0/24.7 -> 6.9/22.4 -> 7.8/23.0 and dropped to 6.7/21.1 this morning received another 1 unit today and after his hemoglobin trended up to 7.5/24.1 yesterday and is now 7.3/22.5 -Continue to Monitor for S/Sx of Bleeding as he has been on Heparin gtt but has been held and will resume hopefully soon  -Repeat CBC in the a.m.  Essential hypertension Chronic diastolic dysfunction -Home meds include labetalol 200 mg twice daily, nifedipine 90 mg daily, Aldactone 25 mg daily, valsartan 80 mg  daily -Meds initially held because of sepsis.  -Currently on labetalol 200 mg twice daily and nifedipine 90 mg daily.  Because of A. fib, cardiology and switched him from nifedipine to Cardizem this morning.  Continue to monitor -Strict I's and O's and daily weights; Patient is +5.486 L -Echo from 1/6 with EF 60 to 65% and grade 2 diastolic dysfunction.  Persistent A. Fib -Patient has history of A. fib and DC cardioversion. -On admission, he was noted to be in A. fib and was started on amiodarone drip. Currently off amiodarone drip.  On labetalol.  Also added Cardizem and is on 30 mg q6h -Prior to admission, patient was on Eliquis for anticoagulation.  In the hospital, he has remained on heparin drip but held due to anemia.  Plan to reinitiate Eliquis prior to discharge once his hemoglobin stabilizes.   -Continue to Monitor on Telemetry ' -Cardiology is following and they have increased his Cardizem and recommended continue labetalol.  He is changed to Cardizem CD 180 mg p.o. daily and they are recommending resuming apixaban when able and his heparin drip is on  hold due to his anemia -Dr. Stanford Breed believes that the patient is essentially asymptomatic and the rate control and anticoagulation will be the best long-term option for this patient  History of type aortic arch dissection  -He is status post repair in 2016 and had dissection at anastomosis suture line seen December 2019.  Course was complicated by cardiogenic shock with EF of 15%.  Ultimately underwent total arch replacement in February 2020 while at [place].  -Continue aspirin and Eliquis but was on Heparin gtt which has been held given Anemia. ?Not on a statin. -Lipid panel with HDL less than 10. Will discuss with cardiology about any specific statin/fibrate he will benefit from.  Chronic hypokalemia Hypomagnesemia -Potassium level running low mostly between 3-3.5. -K+ is now 4.6 -> 5.3 and has improved to 4.6 -Mag was 1.4 this morning replete with IV mag sulfate 3 g -Continue to Monitor and Trend  -Repeat CMP and Mag in the AM   Hyperglycemia -No history of diabetes.  A1c 4.6 on 1/7. -Fingersticks consistently less than 150. Recent Labs    09/09/20 1120  GLUCAP 112*   Obesity -Complicates overall prognosis and care -Estimated body mass index is 35.02 kg/m as calculated from the following:   Height as of this encounter: 5' 11"  (1.803 m).   Weight as of this encounter: 113.9 kg. -Weight Loss and Dietary Counseling given   DVT prophylaxis: SCDs, Will discuss with Ortho about resuming Heparin gtt  Code Status: FULL CODE  Family Communication: No family present at bedside  Disposition Plan: CIR; Has declined SNF; will reach out to inpatient rehab as PT still recommends CIR after his AKA revision and they feel that he is a good candidate and so they have placed a rehab consult.  Status is: Inpatient  Remains inpatient appropriate because:Unsafe d/c plan, IV treatments appropriate due to intensity of illness or inability to take PO and Inpatient level of care appropriate due to  severity of illness   Dispo: The patient is from: Home              Anticipated d/c is to: CIR              Anticipated d/c date is: 2 days              Patient currently is not medically stable to d/c.   Consultants:  Nephrology  Cardiology  Infectious Diseases  Orthopedic Surgery  General Surgery    Procedures:  REVISION LEFT ABOVE KNEE AMPUTATION on 7/59/16 Application of Prevena customizable and Arthur form wound VAC  Antimicrobials:  Anti-infectives (From admission, onward)   Start     Dose/Rate Route Frequency Ordered Stop   09/09/20 0600  ceFAZolin (ANCEF) IVPB 2g/100 mL premix        2 g 200 mL/hr over 30 Minutes Intravenous To Short Stay 09/09/20 0213 09/09/20 1103   09/08/20 1000  linezolid (ZYVOX) IVPB 600 mg  Status:  Discontinued        600 mg 300 mL/hr over 60 Minutes Intravenous Every 12 hours 09/08/20 0838 09/11/20 1104   09/07/20 1015  ceFAZolin (ANCEF) 3 g in dextrose 5 % 50 mL IVPB  Status:  Discontinued        3 g 100 mL/hr over 30 Minutes Intravenous On call to O.R. 09/07/20 1009 09/07/20 1043   09/07/20 0600  ceFAZolin (ANCEF) IVPB 2g/100 mL premix        2 g 200 mL/hr over 30 Minutes Intravenous On call to O.R. 09/06/20 1651 09/07/20 0855   09/02/20 2200  cefTRIAXone (ROCEPHIN) 2 g in sodium chloride 0.9 % 100 mL IVPB        2 g 200 mL/hr over 30 Minutes Intravenous Every 24 hours 09/02/20 1044     09/01/20 2200  vancomycin (VANCOREADY) IVPB 1500 mg/300 mL  Status:  Discontinued        1,500 mg 150 mL/hr over 120 Minutes Intravenous Every 48 hours 08/31/20 0350 08/31/20 1719   09/01/20 0130  ceFEPIme (MAXIPIME) 2 g in sodium chloride 0.9 % 100 mL IVPB  Status:  Discontinued        2 g 200 mL/hr over 30 Minutes Intravenous Every 24 hours 08/31/20 1812 09/02/20 1043   09/01/20 0100  metroNIDAZOLE (FLAGYL) IVPB 500 mg  Status:  Discontinued        500 mg 100 mL/hr over 60 Minutes Intravenous Every 8 hours 08/31/20 1751 08/31/20 1926   08/31/20  2200  ceFEPIme (MAXIPIME) 2 g in sodium chloride 0.9 % 100 mL IVPB  Status:  Discontinued        2 g 200 mL/hr over 30 Minutes Intravenous Every 24 hours 08/31/20 0350 08/31/20 1719   08/31/20 2030  vancomycin (VANCOREADY) IVPB 1500 mg/300 mL        1,500 mg 150 mL/hr over 120 Minutes Intravenous  Once 08/31/20 2016 08/31/20 2338   08/31/20 2015  clindamycin (CLEOCIN) IVPB 900 mg  Status:  Discontinued        900 mg 100 mL/hr over 30 Minutes Intravenous Every 8 hours 08/31/20 1926 09/01/20 1033   08/31/20 1812  vancomycin variable dose per unstable renal function (pharmacist dosing)  Status:  Discontinued         Does not apply See admin instructions 08/31/20 1812 09/01/20 1033   08/31/20 1730  cefTRIAXone (ROCEPHIN) 2 g in sodium chloride 0.9 % 100 mL IVPB  Status:  Discontinued        2 g 200 mL/hr over 30 Minutes Intravenous Every 24 hours 08/31/20 1720 08/31/20 1751   08/31/20 1015  metroNIDAZOLE (FLAGYL) IVPB 500 mg  Status:  Discontinued        500 mg 100 mL/hr over 60 Minutes Intravenous Every 8 hours 08/31/20 1012 08/31/20 1719   08/31/20 0045  ceFEPIme (MAXIPIME) 2 g in sodium chloride 0.9 % 100 mL IVPB  2 g 200 mL/hr over 30 Minutes Intravenous  Once 08/31/20 0030 08/31/20 0208   08/31/20 0000  vancomycin (VANCOREADY) IVPB 1500 mg/300 mL        1,500 mg 150 mL/hr over 120 Minutes Intravenous  Once 08/30/20 2351 08/31/20 0208   08/30/20 1900  cefTRIAXone (ROCEPHIN) 2 g in sodium chloride 0.9 % 100 mL IVPB  Status:  Discontinued        2 g 200 mL/hr over 30 Minutes Intravenous Every 24 hours 08/30/20 1855 08/31/20 0030       Subjective: Seen and examined at bedside and he continues to have significant amount of pain in his leg.  No nausea or vomiting.  States that he will need some time for adjustment given his new AKA and was a little down about this.  No chest pain, lightheadedness or dizziness.  No other concerns or complaints at this time.  Objective: Vitals:    09/10/20 2100 09/11/20 0023 09/11/20 0432 09/11/20 0737  BP: 127/72 140/80 135/78 (!) 145/86  Pulse: 77 79 80   Resp: 18 18 18 18   Temp: 98.3 F (36.8 C) 98.7 F (37.1 C) 99.1 F (37.3 C) 97.9 F (36.6 C)  TempSrc: Oral Oral Oral Oral  SpO2: 94% 97% 95% 95%  Weight:   113.9 kg   Height:   5' 11"  (1.803 m)     Intake/Output Summary (Last 24 hours) at 09/11/2020 1459 Last data filed at 09/11/2020 1000 Gross per 24 hour  Intake 238 ml  Output --  Net 238 ml   Filed Weights   09/08/20 0405 09/09/20 0406 09/11/20 0432  Weight: 112.3 kg 109.6 kg 113.9 kg   Examination: Physical Exam:  Constitutional: WN/WD obese African-American male currently in mild distress and still complaining of some left leg stump pain. Eyes: Lids and conjunctivae normal, sclerae anicteric  ENMT: External Ears, Nose appear normal. Grossly normal hearing. Neck: Appears normal, supple, no cervical masses, normal ROM, no appreciable thyromegaly; no JVD Respiratory: Slightly diminished to auscultation bilaterally, no wheezing, rales, rhonchi or crackles. Normal respiratory effort and patient is not tachypenic. No accessory muscle use.  With unlabored breathing Cardiovascular: Irregularly irregular but rate controlled., no murmurs / rubs / gallops. S1 and S2 auscultated.  No appreciable extremity edema noted and has a left AKA Abdomen: Soft, non-tender, distended secondary body habitus. Bowel sounds positive.  GU: Deferred. Musculoskeletal: At the left AKA Skin: No rashes, lesions, ulcers on limited skin evaluation and I did not view his stump connected to wound VAC. No induration; Warm and dry.  Neurologic: CN 2-12 grossly intact with no focal deficits. Romberg sign and cerebellar reflexes not assessed.  Psychiatric: Normal judgment and insight. Alert and oriented x 3. Normal mood and appropriate affect.   Data Reviewed: I have personally reviewed following labs and imaging studies  CBC: Recent Labs  Lab  09/07/20 0019 09/07/20 1119 09/08/20 0159 09/09/20 0531 09/09/20 1036 09/10/20 0314 09/10/20 1527 09/11/20 0302  WBC 14.5*  --  16.9* 16.3*  --  17.2*  --  14.6*  NEUTROABS  --   --  12.1* 11.7*  --  14.7*  --  11.1*  HGB 7.7*   < > 8.0* 6.9* 7.8* 6.7* 7.5* 7.3*  HCT 23.8*   < > 24.7* 22.4* 23.0* 21.1* 24.1* 22.5*  MCV 90.2  --  90.5 94.9  --  94.2  --  96.2  PLT 487*  --  505* 462*  --  392  --  268   < > =  values in this interval not displayed.   Basic Metabolic Panel: Recent Labs  Lab 09/07/20 0019 09/08/20 0159 09/09/20 0108 09/09/20 1036 09/10/20 0314 09/11/20 0302  NA 144 145 143 145 144 145  K 3.5 3.6 4.6 5.3* 4.6 3.9  CL 109 110 109  --  109 110  CO2 23 24 21*  --  26 25  GLUCOSE 97 116* 83  --  164* 120*  BUN 56* 40* 29*  --  28* 24*  CREATININE 2.84* 1.94* 1.86*  --  2.04* 1.90*  CALCIUM 7.8* 7.8* 7.7*  --  7.7* 7.3*  MG  --  1.4*  --   --  1.6* 1.4*  PHOS  --  3.2  --   --  3.1 3.4   GFR: Estimated Creatinine Clearance: 51.1 mL/min (A) (by C-G formula based on SCr of 1.9 mg/dL (H)). Liver Function Tests: Recent Labs  Lab 09/07/20 0019 09/08/20 0159 09/09/20 0108 09/10/20 0314 09/11/20 0302  AST 38 31 31 24 22   ALT 31 28 21 17 16   ALKPHOS 73 68 64 60 53  BILITOT 0.7 1.1 0.9 0.8 0.7  PROT 6.1* 6.2* 5.9* 5.9* 6.1*  ALBUMIN 2.0* 2.1* 2.0* 2.0* 2.1*   No results for input(s): LIPASE, AMYLASE in the last 168 hours. No results for input(s): AMMONIA in the last 168 hours. Coagulation Profile: No results for input(s): INR, PROTIME in the last 168 hours. Cardiac Enzymes: Recent Labs  Lab 09/07/20 0019 09/08/20 0159 09/09/20 0108 09/10/20 0314 09/11/20 0302  CKTOTAL 803* 660* 428* 474* 390   BNP (last 3 results) No results for input(s): PROBNP in the last 8760 hours. HbA1C: No results for input(s): HGBA1C in the last 72 hours. CBG: Recent Labs  Lab 09/05/20 1509 09/09/20 1120  GLUCAP 116* 112*   Lipid Profile: No results for input(s):  CHOL, HDL, LDLCALC, TRIG, CHOLHDL, LDLDIRECT in the last 72 hours. Thyroid Function Tests: No results for input(s): TSH, T4TOTAL, FREET4, T3FREE, THYROIDAB in the last 72 hours. Anemia Panel: No results for input(s): VITAMINB12, FOLATE, FERRITIN, TIBC, IRON, RETICCTPCT in the last 72 hours. Sepsis Labs: Recent Labs  Lab 09/05/20 0420 09/06/20 0441  PROCALCITON 5.24 2.99    Recent Results (from the past 240 hour(s))  Culture, blood (routine x 2)     Status: None   Collection Time: 09/03/20  5:38 PM   Specimen: BLOOD RIGHT HAND  Result Value Ref Range Status   Specimen Description BLOOD RIGHT HAND  Final   Special Requests   Final    AEROBIC BOTTLE ONLY Blood Culture results may not be optimal due to an inadequate volume of blood received in culture bottles   Culture   Final    NO GROWTH 5 DAYS Performed at [facility], [place] [address], [place], [place] [address]    Report Status 09/08/2020 FINAL  Final  Culture, blood (routine x 2)     Status: None   Collection Time: 09/03/20  5:39 PM   Specimen: BLOOD RIGHT HAND  Result Value Ref Range Status   Specimen Description BLOOD RIGHT HAND  Final   Special Requests   Final    AEROBIC BOTTLE ONLY Blood Culture results may not be optimal due to an inadequate volume of blood received in culture bottles   Culture   Final    NO GROWTH 5 DAYS Performed at [facility], [place] [address], [place], [place] [address]    Report Status 09/08/2020 FINAL  Final  Aerobic/Anaerobic Culture (surgical/deep wound)     Status: None (Preliminary result)   Collection Time: 09/07/20  9:02 AM   Specimen: Leg, Left; Tissue  Result Value Ref Range Status   Specimen Description TISSUE  Final   Special Requests LEFT THIGH FACIA SPEC A  Final   Gram Stain NO WBC SEEN NO ORGANISMS SEEN   Final   Culture   Final    NO GROWTH 4 DAYS NO ANAEROBES ISOLATED; CULTURE IN PROGRESS FOR 5 DAYS Performed at [facility], [place] [address],  [place], [place] [address]    Report Status PENDING  Incomplete  Aerobic/Anaerobic Culture (surgical/deep wound)     Status: None (Preliminary result)   Collection Time: 09/07/20  9:05 AM   Specimen: Leg, Left; Tissue  Result Value Ref Range Status   Specimen Description TISSUE  Final   Special Requests LEFT CALF MUSCLE SPEC B  Final   Gram Stain   Final    RARE WBC PRESENT, PREDOMINANTLY MONONUCLEAR NO ORGANISMS SEEN    Culture   Final    NO GROWTH 4 DAYS NO ANAEROBES ISOLATED; CULTURE IN PROGRESS FOR 5 DAYS Performed at [facility], [place] [address] [place], [place] [address]    Report Status PENDING  Incomplete  Surgical pcr screen     Status: None   Collection Time: 09/09/20  4:22 AM   Specimen: Nasal Mucosa; Nasal Swab  Result Value Ref Range Status   MRSA, PCR NEGATIVE NEGATIVE Final   Staphylococcus aureus NEGATIVE NEGATIVE Final    Comment: (NOTE) The Xpert SA Assay (FDA approved for NASAL specimens in patients 74 years of age and older), is one component of a comprehensive surveillance program. It is not intended to diagnose infection nor to guide or monitor treatment. Performed at [facility], [place] [address], [place], [place] [address]     RN Pressure Injury Documentation:     Estimated body mass index is 35.02 kg/m as calculated from the following:   Height as of this encounter: 5' 11"  (1.803 m).   Weight as of this encounter: 113.9 kg.  Malnutrition Type:   Malnutrition Characteristics:   Nutrition Interventions    Radiology Studies: No results found.  Scheduled Meds: . aspirin EC  81 mg Oral Daily  . diltiazem  180 mg Oral Daily  . feeding supplement  237 mL Oral BID BM  . gabapentin  300 mg Oral TID  . labetalol  200 mg Oral BID  . mouth rinse  15 mL Mouth Rinse BID  . sodium chloride flush  10-40 mL Intracatheter Q12H   Continuous Infusions: . sodium chloride 75 mL/hr at 09/11/20 0438  . cefTRIAXone (ROCEPHIN)  IV 2 g (09/10/20 2127)     LOS: 12 days   Kerney Elbe, DO [facility] PAGER is on [place]  If 7PM-7AM, please contact night-coverage [email or web address]

## 2020-09-12 DIAGNOSIS — L03116 Cellulitis of left lower limb: Secondary | ICD-10-CM | POA: Diagnosis not present

## 2020-09-12 DIAGNOSIS — R778 Other specified abnormalities of plasma proteins: Secondary | ICD-10-CM | POA: Diagnosis not present

## 2020-09-12 DIAGNOSIS — N179 Acute kidney failure, unspecified: Secondary | ICD-10-CM | POA: Diagnosis not present

## 2020-09-12 DIAGNOSIS — N1831 Chronic kidney disease, stage 3a: Secondary | ICD-10-CM | POA: Diagnosis not present

## 2020-09-12 DIAGNOSIS — A409 Streptococcal sepsis, unspecified: Secondary | ICD-10-CM | POA: Diagnosis not present

## 2020-09-12 LAB — CBC WITH DIFFERENTIAL/PLATELET
Abs Immature Granulocytes: 0.31 10*3/uL — ABNORMAL HIGH (ref 0.00–0.07)
Basophils Absolute: 0.1 10*3/uL (ref 0.0–0.1)
Basophils Relative: 1 %
Eosinophils Absolute: 0.3 10*3/uL (ref 0.0–0.5)
Eosinophils Relative: 2 %
HCT: 26.5 % — ABNORMAL LOW (ref 39.0–52.0)
Hemoglobin: 8 g/dL — ABNORMAL LOW (ref 13.0–17.0)
Immature Granulocytes: 2 %
Lymphocytes Relative: 16 %
Lymphs Abs: 2.1 10*3/uL (ref 0.7–4.0)
MCH: 30 pg (ref 26.0–34.0)
MCHC: 30.2 g/dL (ref 30.0–36.0)
MCV: 99.3 fL (ref 80.0–100.0)
Monocytes Absolute: 1 10*3/uL (ref 0.1–1.0)
Monocytes Relative: 8 %
Neutro Abs: 8.9 10*3/uL — ABNORMAL HIGH (ref 1.7–7.7)
Neutrophils Relative %: 71 %
Platelets: 358 10*3/uL (ref 150–400)
RBC: 2.67 MIL/uL — ABNORMAL LOW (ref 4.22–5.81)
RDW: 17 % — ABNORMAL HIGH (ref 11.5–15.5)
WBC: 12.7 10*3/uL — ABNORMAL HIGH (ref 4.0–10.5)
nRBC: 0.9 % — ABNORMAL HIGH (ref 0.0–0.2)

## 2020-09-12 LAB — AEROBIC/ANAEROBIC CULTURE W GRAM STAIN (SURGICAL/DEEP WOUND)
Culture: NO GROWTH
Culture: NO GROWTH
Gram Stain: NONE SEEN

## 2020-09-12 LAB — COMPREHENSIVE METABOLIC PANEL
ALT: 18 U/L (ref 0–44)
AST: 22 U/L (ref 15–41)
Albumin: 2.3 g/dL — ABNORMAL LOW (ref 3.5–5.0)
Alkaline Phosphatase: 58 U/L (ref 38–126)
Anion gap: 9 (ref 5–15)
BUN: 20 mg/dL (ref 8–23)
CO2: 26 mmol/L (ref 22–32)
Calcium: 7.9 mg/dL — ABNORMAL LOW (ref 8.9–10.3)
Chloride: 110 mmol/L (ref 98–111)
Creatinine, Ser: 1.67 mg/dL — ABNORMAL HIGH (ref 0.61–1.24)
GFR, Estimated: 46 mL/min — ABNORMAL LOW (ref >60)
Glucose, Bld: 96 mg/dL (ref 70–99)
Potassium: 4.4 mmol/L (ref 3.5–5.1)
Sodium: 145 mmol/L (ref 135–145)
Total Bilirubin: 0.8 mg/dL (ref 0.3–1.2)
Total Protein: 6.5 g/dL (ref 6.5–8.1)

## 2020-09-12 LAB — MAGNESIUM: Magnesium: 1.6 mg/dL — ABNORMAL LOW (ref 1.7–2.4)

## 2020-09-12 LAB — SURGICAL PATHOLOGY

## 2020-09-12 LAB — CK: Total CK: 363 U/L (ref 49–397)

## 2020-09-12 LAB — PHOSPHORUS: Phosphorus: 3.3 mg/dL (ref 2.5–4.6)

## 2020-09-12 MED ORDER — LABETALOL HCL 200 MG PO TABS
400.0000 mg | ORAL_TABLET | Freq: Two times a day (BID) | ORAL | Status: DC
  Administered 2020-09-12 – 2020-09-19 (×14): 400 mg via ORAL
  Filled 2020-09-12 (×14): qty 2

## 2020-09-12 MED ORDER — MAGNESIUM SULFATE 2 GM/50ML IV SOLN
2.0000 g | Freq: Once | INTRAVENOUS | Status: AC
  Administered 2020-09-12: 2 g via INTRAVENOUS
  Filled 2020-09-12: qty 50

## 2020-09-12 NOTE — Progress Notes (Signed)
PROGRESS NOTE    Connor [name]  JQB:[id number] DOB: 1955-09-01 DOA: 08/30/2020 PCP: Patient, No Pcp Per   Brief Narrative:  Connor [name] is a 65 y.o. male with PMH significant for HTN, CKD 3, A. 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  Patient presented to the ED on 1/4 with 1 week history of nausea, vomiting, chills followed by worsening left leg pain and swelling.  In the ED, he was noted to be in severe sepsis with WBC count elevated to 15, lactic acid 2, creatinine 4.2 with a baseline of 1.3, potassium low at 2.8.  CK level was elevated more than 28,000. Ultrasound rule out DVT.   CT scan of left leg showed soft tissue swelling but no muscle damage and no signs of abscess although limited due to lack of contrast and CT due to renal function.  MRI noted signs of myofascitis.  Patient was admitted to hospitalist service for cellulitis, rhabdomyolysis, AKI. Orthopedic and nephrology consultation were obtained. Blood cultures sent on admission grew Streptococcus group G, cellulitis likely being the source.    Over the course of hospitalization, patient had improvement in parameters for sepsis, rhabdomyolysis, AKI.  However his left leg continued to remain swollen and tender. Orthopedic consultation was obtained with Dr. Sharol Given. 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  09/10/2020 his blood count dropped again in 1-6.7 so we will type and screen H&H is 1 unit of PRBCs.  After infusion his hemoglobin appropriate responded to 7.5 -He continues to have pain so we will add Robaxin IV.  CR feels he is a good candidate given his functional decline and they have placed a rehab consult and if  approved he will go to CIR but patient is conflicted about going as he wants to go home and care for his cousin who is special needs.  Assessment & Plan:   Principal Problem:   Severe sepsis with acute organ dysfunction due to Streptococcus species [facility]) Active Problems:   Persistent atrial fibrillation (HCC)   Hypertension   Cellulitis of left leg   Lactic acidosis   Elevated troponin   Hypoalbuminemia   Leukocytosis   Thrombocytopenia (HCC)   Hypokalemia   Hyperglycemia   Transaminitis   Total bilirubin, elevated   AKI (acute kidney injury) (HCC)   Nausea vomiting and diarrhea   CKD (chronic kidney disease), stage III (HCC)   Morbid obesity (HCC)   Sepsis with acute renal failure without septic shock ([place])   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)  Necrotizing fasciitis of left leg -Initially presented as left leg cellulitis.  Also had evidence of myositis on MRI and rhabdomyolysis. -Sepsis parameters improved with antibiotics but left leg continues to remain swollen and tender. -Orthopedic consultation was obtained with Dr. Sharol Given. 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 -Currently on gabapentin and Dilaudid 1 mg IV every 2 hours as needed for pain control and p.o. oxycodone IR 10 mg every 4 hours as needed. May need adjustment -Underwent Revision of AKA 09/09/2020 -Previous culture showed group B strep and he is status post amputation for source control.  ID is now recommending continue IV  vancomycin while inpatient and discharged changed to oral amoxicillin 5 mg 3 times daily through 09/16/2020 and stopping antibiotics then -Continue pain control and have added IV Robaxin and this seems to help. -PT OT recommending CIR and may be a candidate but patient is to make a decision of whether he wants to go or not.  Bacteremia with Streptococcus group G -Left leg  cellulitis/myositis probably the source.   -Currently on IV Rocephin.  Repeat blood culture from 1/8 did not show any growth.   -TEE 1/10 did not show any evidence of vegetation.  -ID consult appreciated.  We will continue IV ceftriaxone as an inpatient and at discharge will transition to p.o. amoxicillin 500 mg 3 times daily through 09/16/2020  Severe sepsis - POA -Secondary to cellulitis.  Sepsis resolved.   -WBC count slightly bumped yesterdayin response to surgery and is now down to 16.3 Last Labs              Recent Labs  Lab 09/01/20 1026 09/01/20 1538 09/02/20 0416 09/03/20 0419 09/04/20 1446 09/05/20 0420 09/06/20 0441 09/07/20 0019 09/08/20 0159  WBC  --    < > 22.8* 25.8* 20.4* 18.9* 14.9* 14.5* 16.9*  LATICACIDVEN 0.9  --   --   --   --   --   --   --   --   PROCALCITON 43.41  --  34.97 20.62 7.47 5.24 2.99  --   --    < > = values in this interval not displayed.    -BC trended up to 17.2 on 09/10/2020 and is now trending back down to 14.6 yesterday and today is now 12.7  Acute kidney injury -Baseline creatinine less than 1.4.  Patient with elevated creatinine of 4.2, peaked at 7.6 on 1/7.  AKI to be due to acute tubular injury and decreased renal perfusion in the setting of rhabdomyolysis. Creatinine improving gradually.  Currently being monitored off fluid. -BUN/Cr is improving and has gone from 29/1.86 -> 28/2.04 -> 24/1.90 and today it is 20/1.6 on  Rhabdomyolysis  Elevated transaminases -Secondary to left leg myofascitis -CK level and transaminases level are trending down as below with IV fluid. -Currently off IV fluid Abx Last Labs            Recent Labs  Lab 09/02/20 0416 09/03/20 0419 09/04/20 1446 09/05/20 0420 09/06/20 0441 09/07/20 0019 09/08/20 0159  CKTOTAL 18,299* 8,688* 3,244* 2,084* 1,066* 803* 660*    -CAD now normalized and trended down to 474 and is now 390 yesterday today's 363  Hepatic Function Latest Ref Rng & Units 09/12/2020  09/11/2020 09/10/2020  Total Protein 6.5 - 8.1 g/dL 6.5 6.1(L) 5.9(L)  Albumin 3.5 - 5.0 g/dL 2.3(L) 2.1(L) 2.0(L)  AST 15 - 41 U/L 22 22 24   ALT 0 - 44 U/L 18 16 17   Alk Phosphatase 38 - 126 U/L 58 53 60  Total Bilirubin 0.3 - 1.2 mg/dL 0.8 0.7 0.8  Bilirubin, Direct 0.0 - 0.2 mg/dL - - -   Acute anemia/acute blood loss anemia -Unclear cause. No active bleeding but hemoglobin is running low. -received a total of 5 units of PRBCs this admission -Hemoglobin further down to 8 yesterday morning, probably due to surgical blood loss.  Continue to monitor. -Iron, ferritin level normal.  Probably not suggestive of chronic GI bleeding. -Hb/Hct now improving in 1-8.0/26.5 after a unit of blood -Continue to Monitor for S/Sx of Bleeding as he has been on Heparin gtt but has been held  and will resume hopefully soon  -Repeat CBC in the a.m.  Essential hypertension Chronic diastolic dysfunction -Home meds include labetalol 200 mg twice daily, nifedipine 90 mg daily, Aldactone 25 mg daily, valsartan 80 mg daily -Meds initially held because of sepsis.  -Currently on labetalol 200 mg twice daily and nifedipine 90 mg daily.  Because of A. fib, cardiology and switched him from nifedipine to Cardizem this morning.  Continue to monitor -Strict I's and O's and daily weights; Patient is + 9.741 L -Echo from 1/6 with EF 60 to 65% and grade 2 diastolic dysfunction.  Persistent A. Fib -Patient has history of A. fib and DC cardioversion. -On admission, he was noted to be in A. fib and was started on amiodarone drip. Currently off amiodarone drip.  On labetalol.  Also added Cardizem and is on 30 mg q6h -Prior to admission, patient was on Eliquis for anticoagulation.  In the hospital, he has remained on heparin drip but held due to anemia.  Plan to reinitiate Eliquis prior to discharge once his hemoglobin stabilizes.   -Continue to Monitor on Telemetry ' -Cardiology is following and they have increased his  Cardizem and recommended continue labetalol.  He is changed to Cardizem CD 180 mg p.o. daily and they are recommending resuming apixaban when able and his heparin drip is on hold due to his anemia; will resume at the discretion of Ortho and cardiology -Dr. Stanford Breed believes that the patient is essentially asymptomatic and the rate control and anticoagulation will be the best long-term option for this patient  History of type aortic arch dissection  -He is status post repair in 2016 and had dissection at anastomosis suture line seen December 2019.  Course was complicated by cardiogenic shock with EF of 15%.  Ultimately underwent total arch replacement in February 2020 while at [place].  -Continue aspirin and Eliquis but was on Heparin gtt which has been held given Anemia. ?Not on a statin. -Lipid panel with HDL less than 10. Will discuss with cardiology about any specific statin/fibrate he will benefit from.  Chronic hypokalemia Hypomagnesemia -Potassium level running low mostly between 3-3.5. -K+ is now 4.6 -> 5.3 and has improved to 1.4 today -Mag was 1.6 this morning replete with IV mag sulfate 2 g -Continue to Monitor and Trend  -Repeat CMP and Mag in the AM   Hyperglycemia -No history of diabetes.  A1c 4.6 on 1/7. -Fingersticks consistently less than 150; blood sugars ranging from 96-1 64 on daily BMP/CMP No results for input(s): GLUCAP in the last 72 hours.   Obesity -Complicates overall prognosis and care -Estimated body mass index is 35.02 kg/m as calculated from the following:   Height as of this encounter: 5' 11"  (1.803 m).   Weight as of this encounter: 113.9 kg. -Weight Loss and Dietary Counseling given   DVT prophylaxis: SCDs, Will discuss with Ortho and cardiology about resuming Heparin gtt  Code Status: FULL CODE  Family Communication: No family present at bedside  Disposition Plan: CIR; Has declined SNF; will reach out to inpatient rehab as PT still recommends CIR  after his AKA revision and they feel that he is a good candidate and so they have placed a rehab consult.  Rehab considering excepting the patient the patient needs to make a decision at discharge as he wants to go home  Status is: Inpatient  Remains inpatient appropriate because:Unsafe d/c plan, IV treatments appropriate due to intensity of illness or inability to take PO and Inpatient  level of care appropriate due to severity of illness   Dispo: The patient is from: Home              Anticipated d/c is to: CIR              Anticipated d/c date is: 2 days              Patient currently is not medically stable to d/c.   Consultants:   Nephrology  Cardiology  Infectious Diseases  Orthopedic Surgery  General Surgery    Procedures:  REVISION LEFT ABOVE KNEE AMPUTATION on 1/61/09 Application of Prevena customizable and Arthur form wound VAC  Antimicrobials:  Anti-infectives (From admission, onward)   Start     Dose/Rate Route Frequency Ordered Stop   09/09/20 0600  ceFAZolin (ANCEF) IVPB 2g/100 mL premix        2 g 200 mL/hr over 30 Minutes Intravenous To Short Stay 09/09/20 0213 09/09/20 1103   09/08/20 1000  linezolid (ZYVOX) IVPB 600 mg  Status:  Discontinued        600 mg 300 mL/hr over 60 Minutes Intravenous Every 12 hours 09/08/20 0838 09/11/20 1104   09/07/20 1015  ceFAZolin (ANCEF) 3 g in dextrose 5 % 50 mL IVPB  Status:  Discontinued        3 g 100 mL/hr over 30 Minutes Intravenous On call to O.R. 09/07/20 1009 09/07/20 1043   09/07/20 0600  ceFAZolin (ANCEF) IVPB 2g/100 mL premix        2 g 200 mL/hr over 30 Minutes Intravenous On call to O.R. 09/06/20 1651 09/07/20 0855   09/02/20 2200  cefTRIAXone (ROCEPHIN) 2 g in sodium chloride 0.9 % 100 mL IVPB        2 g 200 mL/hr over 30 Minutes Intravenous Every 24 hours 09/02/20 1044     09/01/20 2200  vancomycin (VANCOREADY) IVPB 1500 mg/300 mL  Status:  Discontinued        1,500 mg 150 mL/hr over 120 Minutes  Intravenous Every 48 hours 08/31/20 0350 08/31/20 1719   09/01/20 0130  ceFEPIme (MAXIPIME) 2 g in sodium chloride 0.9 % 100 mL IVPB  Status:  Discontinued        2 g 200 mL/hr over 30 Minutes Intravenous Every 24 hours 08/31/20 1812 09/02/20 1043   09/01/20 0100  metroNIDAZOLE (FLAGYL) IVPB 500 mg  Status:  Discontinued        500 mg 100 mL/hr over 60 Minutes Intravenous Every 8 hours 08/31/20 1751 08/31/20 1926   08/31/20 2200  ceFEPIme (MAXIPIME) 2 g in sodium chloride 0.9 % 100 mL IVPB  Status:  Discontinued        2 g 200 mL/hr over 30 Minutes Intravenous Every 24 hours 08/31/20 0350 08/31/20 1719   08/31/20 2030  vancomycin (VANCOREADY) IVPB 1500 mg/300 mL        1,500 mg 150 mL/hr over 120 Minutes Intravenous  Once 08/31/20 2016 08/31/20 2338   08/31/20 2015  clindamycin (CLEOCIN) IVPB 900 mg  Status:  Discontinued        900 mg 100 mL/hr over 30 Minutes Intravenous Every 8 hours 08/31/20 1926 09/01/20 1033   08/31/20 1812  vancomycin variable dose per unstable renal function (pharmacist dosing)  Status:  Discontinued         Does not apply See admin instructions 08/31/20 1812 09/01/20 1033   08/31/20 1730  cefTRIAXone (ROCEPHIN) 2 g in sodium chloride 0.9 % 100 mL IVPB  Status:  Discontinued        2 g 200 mL/hr over 30 Minutes Intravenous Every 24 hours 08/31/20 1720 08/31/20 1751   08/31/20 1015  metroNIDAZOLE (FLAGYL) IVPB 500 mg  Status:  Discontinued        500 mg 100 mL/hr over 60 Minutes Intravenous Every 8 hours 08/31/20 1012 08/31/20 1719   08/31/20 0045  ceFEPIme (MAXIPIME) 2 g in sodium chloride 0.9 % 100 mL IVPB        2 g 200 mL/hr over 30 Minutes Intravenous  Once 08/31/20 0030 08/31/20 0208   08/31/20 0000  vancomycin (VANCOREADY) IVPB 1500 mg/300 mL        1,500 mg 150 mL/hr over 120 Minutes Intravenous  Once 08/30/20 2351 08/31/20 0208   08/30/20 1900  cefTRIAXone (ROCEPHIN) 2 g in sodium chloride 0.9 % 100 mL IVPB  Status:  Discontinued        2 g 200 mL/hr  over 30 Minutes Intravenous Every 24 hours 08/30/20 1855 08/31/20 0030       Subjective: Seen and examined at bedside and he is still complaining of some leg pain but thinks he is doing little bit better.  No nausea or vomiting.  No lightheadedness or dizziness.  Denies any chest pain or shortness of breath.  No other concerns or complaints at this time.  Objective: Vitals:   09/11/20 1944 09/12/20 0535 09/12/20 0838 09/12/20 1432  BP: (!) 156/83 (!) 147/83 (!) 167/82 (!) 172/83  Pulse: 73 76 86 84  Resp: 18 18    Temp: 98.5 F (36.9 C) 98.7 F (37.1 C)    TempSrc: Oral Oral    SpO2: 99% 95% 91% 95%  Weight:      Height:        Intake/Output Summary (Last 24 hours) at 09/12/2020 1607 Last data filed at 09/12/2020 1600 Gross per 24 hour  Intake 6290.05 ml  Output 2275 ml  Net 4015.05 ml   Filed Weights   09/08/20 0405 09/09/20 0406 09/11/20 0432  Weight: 112.3 kg 109.6 kg 113.9 kg   Examination: Physical Exam:  Constitutional: WN/WD obese African-American male currently in no acute distress appears calm and little bit more comfortable today than yesterday and not playing as much left leg stump pain Eyes: Lds and conjunctivae normal, sclerae anicteric  ENMT: External Ears, Nose appear normal. Grossly normal hearing.  Neck: Appears normal, supple, no cervical masses, normal ROM, no appreciable thyromegaly; no JVD Respiratory: Diminished to auscultation bilaterally, no wheezing, rales, rhonchi or crackles. Normal respiratory effort and patient is not tachypenic. No accessory muscle use.  Cardiovascular: Irregularly irregular, no murmurs / rubs / gallops. S1 and S2 auscultated.  Slight extremity edema on the right Abdomen: Soft, non-tender, distended secondary to body habitus. No masses palpated. No appreciable hepatosplenomegaly. Bowel sounds positive.  GU: Deferred. Musculoskeletal: There is a left AKA Skin: No rashes, lesions, ulcers on limited skin evaluation and I did not  view his wound and surgical incision connected to wound VAC.Marland Kitchen No induration; Warm and dry.  Neurologic: CN 2-12 grossly intact with no focal deficits.  Romberg sign and cerebellar reflexes not assessed.  Psychiatric: Normal judgment and insight. Alert and oriented x 3. Normal mood and appropriate affect.   Data Reviewed: I have personally reviewed following labs and imaging studies  CBC: Recent Labs  Lab 09/08/20 0159 09/09/20 0531 09/09/20 1036 09/10/20 0314 09/10/20 1527 09/11/20 0302 09/12/20 0209  WBC 16.9* 16.3*  --  17.2*  --  14.6* 12.7*  NEUTROABS 12.1* 11.7*  --  14.7*  --  11.1* 8.9*  HGB 8.0* 6.9* 7.8* 6.7* 7.5* 7.3* 8.0*  HCT 24.7* 22.4* 23.0* 21.1* 24.1* 22.5* 26.5*  MCV 90.5 94.9  --  94.2  --  96.2 99.3  PLT 505* 462*  --  392  --  268 035   Basic Metabolic Panel: Recent Labs  Lab 09/08/20 0159 09/09/20 0108 09/09/20 1036 09/10/20 0314 09/11/20 0302 09/12/20 0209  NA 145 143 145 144 145 145  K 3.6 4.6 5.3* 4.6 3.9 4.4  CL 110 109  --  109 110 110  CO2 24 21*  --  26 25 26   GLUCOSE 116* 83  --  164* 120* 96  BUN 40* 29*  --  28* 24* 20  CREATININE 1.94* 1.86*  --  2.04* 1.90* 1.67*  CALCIUM 7.8* 7.7*  --  7.7* 7.3* 7.9*  MG 1.4*  --   --  1.6* 1.4* 1.6*  PHOS 3.2  --   --  3.1 3.4 3.3   GFR: Estimated Creatinine Clearance: 58.1 mL/min (A) (by C-G formula based on SCr of 1.67 mg/dL (H)). Liver Function Tests: Recent Labs  Lab 09/08/20 0159 09/09/20 0108 09/10/20 0314 09/11/20 0302 09/12/20 0209  AST 31 31 24 22 22   ALT 28 21 17 16 18   ALKPHOS 68 64 60 53 58  BILITOT 1.1 0.9 0.8 0.7 0.8  PROT 6.2* 5.9* 5.9* 6.1* 6.5  ALBUMIN 2.1* 2.0* 2.0* 2.1* 2.3*   No results for input(s): LIPASE, AMYLASE in the last 168 hours. No results for input(s): AMMONIA in the last 168 hours. Coagulation Profile: No results for input(s): INR, PROTIME in the last 168 hours. Cardiac Enzymes: Recent Labs  Lab 09/08/20 0159 09/09/20 0108 09/10/20 0314  09/11/20 0302 09/12/20 0209  CKTOTAL 660* 428* 474* 390 363   BNP (last 3 results) No results for input(s): PROBNP in the last 8760 hours. HbA1C: No results for input(s): HGBA1C in the last 72 hours. CBG: Recent Labs  Lab 09/09/20 1120  GLUCAP 112*   Lipid Profile: No results for input(s): CHOL, HDL, LDLCALC, TRIG, CHOLHDL, LDLDIRECT in the last 72 hours. Thyroid Function Tests: No results for input(s): TSH, T4TOTAL, FREET4, T3FREE, THYROIDAB in the last 72 hours. Anemia Panel: No results for input(s): VITAMINB12, FOLATE, FERRITIN, TIBC, IRON, RETICCTPCT in the last 72 hours. Sepsis Labs: Recent Labs  Lab 09/06/20 0441  PROCALCITON 2.99    Recent Results (from the past 240 hour(s))  Culture, blood (routine x 2)     Status: None   Collection Time: 09/03/20  5:38 PM   Specimen: BLOOD RIGHT HAND  Result Value Ref Range Status   Specimen Description BLOOD RIGHT HAND  Final   Special Requests   Final    AEROBIC BOTTLE ONLY Blood Culture results may not be optimal due to an inadequate volume of blood received in culture bottles   Culture   Final    NO GROWTH 5 DAYS Performed at [facility], [place] [address] [place], [place] [address]    Report Status 09/08/2020 FINAL  Final  Culture, blood (routine x 2)     Status: None   Collection Time: 09/03/20  5:39 PM   Specimen: BLOOD RIGHT HAND  Result Value Ref Range Status   Specimen Description BLOOD RIGHT HAND  Final   Special Requests   Final    AEROBIC BOTTLE ONLY Blood Culture results may not be optimal due to an inadequate volume of  blood received in culture bottles   Culture   Final    NO GROWTH 5 DAYS Performed at [facility], [place] [address], [place], [place] [address]    Report Status 09/08/2020 FINAL  Final  Aerobic/Anaerobic Culture (surgical/deep wound)     Status: None   Collection Time: 09/07/20  9:02 AM   Specimen: Leg, Left; Tissue  Result Value Ref Range Status   Specimen Description TISSUE   Final   Special Requests LEFT THIGH FACIA SPEC A  Final   Gram Stain NO WBC SEEN NO ORGANISMS SEEN   Final   Culture   Final    No growth aerobically or anaerobically. Performed at [facility], [place] [address], [place], [place] [address]    Report Status 09/12/2020 FINAL  Final  Aerobic/Anaerobic Culture (surgical/deep wound)     Status: None   Collection Time: 09/07/20  9:05 AM   Specimen: Leg, Left; Tissue  Result Value Ref Range Status   Specimen Description TISSUE  Final   Special Requests LEFT CALF MUSCLE SPEC B  Final   Gram Stain   Final    RARE WBC PRESENT, PREDOMINANTLY MONONUCLEAR NO ORGANISMS SEEN    Culture   Final    No growth aerobically or anaerobically. Performed at [facility], [place] [address] [place], [place] [address]    Report Status 09/12/2020 FINAL  Final  Surgical pcr screen     Status: None   Collection Time: 09/09/20  4:22 AM   Specimen: Nasal Mucosa; Nasal Swab  Result Value Ref Range Status   MRSA, PCR NEGATIVE NEGATIVE Final   Staphylococcus aureus NEGATIVE NEGATIVE Final    Comment: (NOTE) The Xpert SA Assay (FDA approved for NASAL specimens in patients 86 years of age and older), is one component of a comprehensive surveillance program. It is not intended to diagnose infection nor to guide or monitor treatment. Performed at [facility], [place] [address], [place], [place] [address]     RN Pressure Injury Documentation:     Estimated body mass index is 35.02 kg/m as calculated from the following:   Height as of this encounter: 5' 11"  (1.803 m).   Weight as of this encounter: 113.9 kg.  Malnutrition Type:   Malnutrition Characteristics:   Nutrition Interventions    Radiology Studies: No results found.  Scheduled Meds: . aspirin EC  81 mg Oral Daily  . diltiazem  180 mg Oral Daily  . feeding supplement  237 mL Oral BID BM  . gabapentin  300 mg Oral TID  . labetalol  400 mg Oral BID  . mouth rinse  15 mL Mouth  Rinse BID  . sodium chloride flush  10-40 mL Intracatheter Q12H   Continuous Infusions: . sodium chloride Stopped (09/12/20 1223)  . cefTRIAXone (ROCEPHIN)  IV Stopped (09/11/20 2127)  . methocarbamol (ROBAXIN) IV Stopped (09/11/20 2305)    LOS: 13 days   Kerney Elbe, DO [facility] PAGER is on [place]  If 7PM-7AM, please contact night-coverage [email or web address]

## 2020-09-12 NOTE — Progress Notes (Addendum)
Progress Note  Patient Name: Connor [name] Date of Encounter: 09/12/2020  [facility] Cardiologist: Little Ishikawa, MD   Subjective   Still w/ poor pain control, no other issues  Inpatient Medications    Scheduled Meds: . aspirin EC  81 mg Oral Daily  . diltiazem  180 mg Oral Daily  . feeding supplement  237 mL Oral BID BM  . gabapentin  300 mg Oral TID  . labetalol  200 mg Oral BID  . mouth rinse  15 mL Mouth Rinse BID  . sodium chloride flush  10-40 mL Intracatheter Q12H   Continuous Infusions: . sodium chloride 75 mL/hr at 09/11/20 2054  . cefTRIAXone (ROCEPHIN)  IV 2 g (09/11/20 2056)  . methocarbamol (ROBAXIN) IV 500 mg (09/11/20 2232)   PRN Meds: hydrALAZINE, HYDROmorphone (DILAUDID) injection, methocarbamol (ROBAXIN) IV, metoCLOPramide **OR** metoCLOPramide (REGLAN) injection, ondansetron **OR** ondansetron (ZOFRAN) IV, oxyCODONE, sodium chloride flush   Vital Signs    Vitals:   09/11/20 0737 09/11/20 1944 09/12/20 0535 09/12/20 0838  BP: (!) 145/86 (!) 156/83 (!) 147/83 (!) 167/82  Pulse:  73 76 86  Resp: 18 18 18    Temp: 97.9 F (36.6 C) 98.5 F (36.9 C) 98.7 F (37.1 C)   TempSrc: Oral Oral Oral   SpO2: 95% 99% 95% 91%  Weight:      Height:        Intake/Output Summary (Last 24 hours) at 09/12/2020 0849 Last data filed at 09/12/2020 0800 Gross per 24 hour  Intake 4850.03 ml  Output 1250 ml  Net 3600.03 ml   Last 3 Weights 09/11/2020 09/09/2020 09/08/2020  Weight (lbs) 251 lb 1.7 oz 241 lb 10 oz 247 lb 9.6 oz  Weight (kg) 113.9 kg 109.6 kg 112.311 kg      Telemetry    Atrial fib, rate OK, rare PVCs and short runs NSVT  - Personally Reviewed  Physical Exam   GEN: No acute distress.   Neck: no JVD Cardiac: Irreg R&R, soft murmur, no rubs, or gallops.  Respiratory: clear bilaterally  GI: Soft, nontender, non-distended  MS: No edema; s/p L AKA. Neuro:  Nonfocal  Psych: Normal affect   Labs    High Sensitivity Troponin:    Recent Labs  Lab 08/30/20 1926 08/30/20 2215 09/06/20 2147 09/07/20 0019  TROPONINIHS 425* 482* 32* 33*      Chemistry Recent Labs  Lab 09/10/20 0314 09/11/20 0302 09/12/20 0209  NA 144 145 145  K 4.6 3.9 4.4  CL 109 110 110  CO2 26 25 26   GLUCOSE 164* 120* 96  BUN 28* 24* 20  CREATININE 2.04* 1.90* 1.67*  CALCIUM 7.7* 7.3* 7.9*  PROT 5.9* 6.1* 6.5  ALBUMIN 2.0* 2.1* 2.3*  AST 24 22 22   ALT 17 16 18   ALKPHOS 60 53 58  BILITOT 0.8 0.7 0.8  GFRNONAA 36* 39* 46*  ANIONGAP 9 10 9      Hematology Recent Labs  Lab 09/10/20 0314 09/10/20 1527 09/11/20 0302 09/12/20 0209  WBC 17.2*  --  14.6* 12.7*  RBC 2.24*  --  2.34* 2.67*  HGB 6.7* 7.5* 7.3* 8.0*  HCT 21.1* 24.1* 22.5* 26.5*  MCV 94.2  --  96.2 99.3  MCH 29.9  --  31.2 30.0  MCHC 31.8  --  32.4 30.2  RDW 16.8*  --  16.9* 17.0*  PLT 392  --  268 358    Patient Profile     65 y.o. male with past medical history aortic  dissection repair, previous cardiomyopathy improved, persistent atrial fibrillation, hypertension admitted with diarrhea, leg pain with fasciitis, fever.  Found to be bacteremic and also in atrial fibrillation with elevated rate.  Cardiology asked to evaluate.  Echocardiogram shows ejection fraction 60 to 65%, severe left ventricular hypertrophy, grade 2 diastolic dysfunction, biatrial enlargement. Had AKA for necrotizing fasciitis.  Assessment & Plan    1 bacteremia/necrotizing fasciititis-previous TEE showed no vegetation. - ABX and other management per IM/ID/Ortho    2 persistent atrial fibrillation- - on Cardizem CD 180 mg qd and labetalol 200 mg bid - HR appropriate, 60s-100s - Eliquis on hold for surgery, Heparin on hold for anemia  3 acute kidney injury felt 2nd Rhabdo - - peak BUN/Cr 91/7.61 on 01/06, peak CK 24,138 on 01/05 - steady improvement, BUN/Cr now 20/1.67, CK 363 - per IM  4 s/p left AKA-  - on dilaudid for pain control - per Ortho  5 history of aortic arch  dissection repair- - BP higher last 24 hr, adjust meds as below  6 hypertension- - BP higher last 24 hr - home rx was labetalol 400 mg bid and nifedipine 90 mg qd - now on labetalol 200 mg bid and Cardizem CD 180 mg qd - will increase labetalol back to home dose, follow BP/HR  7 anemia and other issues, per IM  For questions or updates, please contact [facility] Please consult [email or web address] for contact info under     Signed, Theodore Demark, PA-C  09/12/2020, 8:49 AM

## 2020-09-12 NOTE — Progress Notes (Signed)
Inpatient Rehabilitation-Admissions Coordinator   CIR consult received. Met with pt after PT session today to discuss recommended CIR program. 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 Discussed benefits of CIR program, estimated LOS, and anticipated DC support. AC provided pt with brochures for him to look over tonight. With his permission I spoke to his niece to discuss program details as well and review caregiver support. Per the niece, he can have intermittent supervision from family at DC. Encouraged his niece to discuss rehab options with him this evening. Will follow up with pt tomorrow for dispo determination.   Raechel Ache, OTR/L  Rehab Admissions Coordinator  [phone] 09/12/2020 3:12 PM

## 2020-09-12 NOTE — Progress Notes (Signed)
Physical Therapy Treatment Patient Details Name: Connor [name] MRN: [id number] DOB: 1956/03/30 Today's Date: 09/12/2020    History of Present Illness 65 yo male presenting originally for nausea/vomiting, fever, and LLE pain and swelling. Pt found to have sepsis and rhabdomyolysis stemming from LLE cellulitis/myofascitis. 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  Had revision of surgery on 1/14, now re assessed by PT.  PMH includes: AAA, CABG, and HTN.    PT Comments    Pt making steady progress. Continue to recommend CIR so pt can reach highest level of independence prior to return home.    Follow Up Recommendations  CIR     Equipment Recommendations  Rolling walker with 5" wheels;Wheelchair (measurements PT);Wheelchair cushion (measurements PT)    Recommendations for Other Services       Precautions / Restrictions Precautions Precautions: Fall Precaution Comments: LLE wound vac    Mobility  Bed Mobility Overal bed mobility: Needs Assistance Bed Mobility: Supine to Sit;Sit to Supine     Supine to sit: Supervision;HOB elevated Sit to supine: Supervision   General bed mobility comments: Incr time and effort but no physical assist  Transfers Overall transfer level: Needs assistance Equipment used: Rolling walker (2 wheeled) Transfers: Sit to/from Stand Sit to Stand: Mod assist;From elevated surface         General transfer comment: Assist to bring hips up and for balance.  Ambulation/Gait Ambulation/Gait assistance: Min assist;+2 safety/equipment Gait Distance (Feet): 25 Feet Assistive device: Rolling walker (2 wheeled) Gait Pattern/deviations: Step-to pattern (hop to) Gait velocity: decr Gait velocity interpretation: <1.31 ft/sec, indicative of household ambulator General Gait Details: Assist for balance and support. 2nd person for lines and safety   Stairs             Wheelchair Mobility    Modified Rankin (Stroke Patients Only)        Balance Overall balance assessment: Needs assistance Sitting-balance support: No upper extremity supported;Feet supported Sitting balance-Leahy Scale: Fair     Standing balance support: Bilateral upper extremity supported Standing balance-Leahy Scale: Poor Standing balance comment: walker and min assist for static standing                            Cognition Arousal/Alertness: Awake/alert Behavior During Therapy: Flat affect Overall Cognitive Status: No family/caregiver present to determine baseline cognitive functioning Area of Impairment: Problem solving                             Problem Solving: Slow processing        Exercises      General Comments        Pertinent Vitals/Pain Pain Assessment: 0-10 Pain Score: 10-Worst pain ever Pain Location: LLE Pain Descriptors / Indicators: Aching Pain Intervention(s): Monitored during session;Premedicated before session    Home Living                      Prior Function            PT Goals (current goals can now be found in the care plan section) Progress towards PT goals: Progressing toward goals    Frequency    Min 3X/week      PT Plan Current plan remains appropriate    Co-evaluation              AM-PAC PT "6 Clicks" Mobility   Outcome Measure  Help needed turning from your back to your side while in a flat bed without using bedrails?: A Little Help needed moving from lying on your back to sitting on the side of a flat bed without using bedrails?: A Little Help needed moving to and from a bed to a chair (including a wheelchair)?: A Little Help needed standing up from a chair using your arms (e.g., wheelchair or bedside chair)?: A Little Help needed to walk in hospital room?: A Little Help needed climbing 3-5 steps with a railing? : Total 6 Click Score: 16    End of Session Equipment Utilized During Treatment: Gait belt Activity Tolerance: Patient  tolerated treatment well Patient left: in bed;with call bell/phone within reach;with bed alarm set Nurse Communication: Mobility status PT Visit Diagnosis: Unsteadiness on feet (R26.81);Other abnormalities of gait and mobility (R26.89);Muscle weakness (generalized) (M62.81)     Time: 8676-7209 PT Time Calculation (min) (ACUTE ONLY): 25 min  Charges:  $Gait Training: 23-37 mins                     [facility] PT Acute Rehabilitation Services Pager [phone] Office [phone]    Angelina Ok [facility] 09/12/2020, 5:07 PM

## 2020-09-13 DIAGNOSIS — L03116 Cellulitis of left lower limb: Secondary | ICD-10-CM | POA: Diagnosis not present

## 2020-09-13 DIAGNOSIS — N1831 Chronic kidney disease, stage 3a: Secondary | ICD-10-CM | POA: Diagnosis not present

## 2020-09-13 DIAGNOSIS — N183 Chronic kidney disease, stage 3 unspecified: Secondary | ICD-10-CM

## 2020-09-13 LAB — COMPREHENSIVE METABOLIC PANEL
ALT: 17 U/L (ref 0–44)
AST: 22 U/L (ref 15–41)
Albumin: 2.4 g/dL — ABNORMAL LOW (ref 3.5–5.0)
Alkaline Phosphatase: 63 U/L (ref 38–126)
Anion gap: 9 (ref 5–15)
BUN: 21 mg/dL (ref 8–23)
CO2: 25 mmol/L (ref 22–32)
Calcium: 7.9 mg/dL — ABNORMAL LOW (ref 8.9–10.3)
Chloride: 107 mmol/L (ref 98–111)
Creatinine, Ser: 1.48 mg/dL — ABNORMAL HIGH (ref 0.61–1.24)
GFR, Estimated: 53 mL/min — ABNORMAL LOW (ref >60)
Glucose, Bld: 103 mg/dL — ABNORMAL HIGH (ref 70–99)
Potassium: 4.4 mmol/L (ref 3.5–5.1)
Sodium: 141 mmol/L (ref 135–145)
Total Bilirubin: 0.8 mg/dL (ref 0.3–1.2)
Total Protein: 6.7 g/dL (ref 6.5–8.1)

## 2020-09-13 LAB — CBC WITH DIFFERENTIAL/PLATELET
Abs Immature Granulocytes: 0.19 10*3/uL — ABNORMAL HIGH (ref 0.00–0.07)
Basophils Absolute: 0.1 10*3/uL (ref 0.0–0.1)
Basophils Relative: 0 %
Eosinophils Absolute: 0.2 10*3/uL (ref 0.0–0.5)
Eosinophils Relative: 1 %
HCT: 25.2 % — ABNORMAL LOW (ref 39.0–52.0)
Hemoglobin: 8.1 g/dL — ABNORMAL LOW (ref 13.0–17.0)
Immature Granulocytes: 1 %
Lymphocytes Relative: 10 %
Lymphs Abs: 1.5 10*3/uL (ref 0.7–4.0)
MCH: 31.3 pg (ref 26.0–34.0)
MCHC: 32.1 g/dL (ref 30.0–36.0)
MCV: 97.3 fL (ref 80.0–100.0)
Monocytes Absolute: 1.3 10*3/uL — ABNORMAL HIGH (ref 0.1–1.0)
Monocytes Relative: 9 %
Neutro Abs: 11.5 10*3/uL — ABNORMAL HIGH (ref 1.7–7.7)
Neutrophils Relative %: 79 %
Platelets: 314 10*3/uL (ref 150–400)
RBC: 2.59 MIL/uL — ABNORMAL LOW (ref 4.22–5.81)
RDW: 17.2 % — ABNORMAL HIGH (ref 11.5–15.5)
WBC: 14.7 10*3/uL — ABNORMAL HIGH (ref 4.0–10.5)
nRBC: 0.6 % — ABNORMAL HIGH (ref 0.0–0.2)

## 2020-09-13 LAB — CK: Total CK: 307 U/L (ref 49–397)

## 2020-09-13 LAB — PHOSPHORUS: Phosphorus: 3.5 mg/dL (ref 2.5–4.6)

## 2020-09-13 LAB — MAGNESIUM: Magnesium: 1.7 mg/dL (ref 1.7–2.4)

## 2020-09-13 MED ORDER — HEPARIN (PORCINE) 25000 UT/250ML-% IV SOLN
2400.0000 [IU]/h | INTRAVENOUS | Status: DC
  Administered 2020-09-13 – 2020-09-14 (×2): 2300 [IU]/h via INTRAVENOUS
  Filled 2020-09-13 (×3): qty 250

## 2020-09-13 MED ORDER — DILTIAZEM HCL ER COATED BEADS 120 MG PO CP24
240.0000 mg | ORAL_CAPSULE | Freq: Every day | ORAL | Status: DC
  Administered 2020-09-13 – 2020-09-19 (×7): 240 mg via ORAL
  Filled 2020-09-13 (×3): qty 2
  Filled 2020-09-13: qty 1
  Filled 2020-09-13 (×3): qty 2
  Filled 2020-09-13: qty 1

## 2020-09-13 MED ORDER — MAGNESIUM SULFATE 2 GM/50ML IV SOLN
2.0000 g | Freq: Once | INTRAVENOUS | Status: AC
  Administered 2020-09-13: 2 g via INTRAVENOUS
  Filled 2020-09-13: qty 50

## 2020-09-13 NOTE — Progress Notes (Signed)
Inpatient Rehab Admissions Coordinator:   Attempted to reach pt to discuss CIR via phone (working remotely), but unable.  Note that he is now agreeable to CIR.  Per PT documentation pain still very high.  Will continue to follow for potential admission pending pain control on oral medication, bed availability, and pt confirmation of desire for CIR.   Estill Dooms, PT, DPT Admissions Coordinator [phone] 09/13/20  2:44 PM

## 2020-09-13 NOTE — Progress Notes (Signed)
Progress Note  Patient Name: Connor [name] Date of Encounter: 09/13/2020  [facility] Cardiologist: Little Ishikawa, MD   Subjective   Feeling a little better, cannot tell that he is in SR, wants to do rehab and get better.  Inpatient Medications    Scheduled Meds: . aspirin EC  81 mg Oral Daily  . diltiazem  180 mg Oral Daily  . feeding supplement  237 mL Oral BID BM  . gabapentin  300 mg Oral TID  . labetalol  400 mg Oral BID  . mouth rinse  15 mL Mouth Rinse BID  . sodium chloride flush  10-40 mL Intracatheter Q12H   Continuous Infusions: . sodium chloride Stopped (09/12/20 1223)  . cefTRIAXone (ROCEPHIN)  IV 2 g (09/12/20 2211)  . methocarbamol (ROBAXIN) IV Stopped (09/11/20 2305)   PRN Meds: hydrALAZINE, HYDROmorphone (DILAUDID) injection, methocarbamol (ROBAXIN) IV, metoCLOPramide **OR** metoCLOPramide (REGLAN) injection, ondansetron **OR** ondansetron (ZOFRAN) IV, oxyCODONE, sodium chloride flush   Vital Signs    Vitals:   09/12/20 0838 09/12/20 1432 09/12/20 2100 09/13/20 0427  BP: (!) 167/82 (!) 172/83 (!) 164/97 (!) 145/88  Pulse: 86 84 100 88  Resp:   18 18  Temp:   99.4 F (37.4 C) 98.9 F (37.2 C)  TempSrc:   Oral Oral  SpO2: 91% 95% 93% 92%  Weight:      Height:        Intake/Output Summary (Last 24 hours) at 09/13/2020 0809 Last data filed at 09/13/2020 0556 Gross per 24 hour  Intake 2158.02 ml  Output 2475 ml  Net -316.98 ml   Last 3 Weights 09/11/2020 09/09/2020 09/08/2020  Weight (lbs) 251 lb 1.7 oz 241 lb 10 oz 247 lb 9.6 oz  Weight (kg) 113.9 kg 109.6 kg 112.311 kg      Telemetry    Afib>> SR last pm, occ PACs  - Personally Reviewed  Physical Exam   GEN: No acute distress.   Neck: No JVD Cardiac: RRR, 2/6 murmur, no rubs, or gallops.  Respiratory: diminished to auscultation bilaterally   GI: Soft, nontender, non-distended  MS: No edema; No deformity. S/p L AKA Neuro:  Nonfocal  Psych: Normal affect   Labs    High  Sensitivity Troponin:   Recent Labs  Lab 08/30/20 1926 08/30/20 2215 09/06/20 2147 09/07/20 0019  TROPONINIHS 425* 482* 32* 33*     Lab Results  Component Value Date   CKTOTAL 307 09/13/2020    Chemistry Recent Labs  Lab 09/11/20 0302 09/12/20 0209 09/13/20 0352  NA 145 145 141  K 3.9 4.4 4.4  CL 110 110 107  CO2 25 26 25   GLUCOSE 120* 96 103*  BUN 24* 20 21  CREATININE 1.90* 1.67* 1.48*  CALCIUM 7.3* 7.9* 7.9*  PROT 6.1* 6.5 6.7  ALBUMIN 2.1* 2.3* 2.4*  AST 22 22 22   ALT 16 18 17   ALKPHOS 53 58 63  BILITOT 0.7 0.8 0.8  GFRNONAA 39* 46* 53*  ANIONGAP 10 9 9      Hematology Recent Labs  Lab 09/11/20 0302 09/12/20 0209 09/13/20 0352  WBC 14.6* 12.7* 14.7*  RBC 2.34* 2.67* 2.59*  HGB 7.3* 8.0* 8.1*  HCT 22.5* 26.5* 25.2*  MCV 96.2 99.3 97.3  MCH 31.2 30.0 31.3  MCHC 32.4 30.2 32.1  RDW 16.9* 17.0* 17.2*  PLT 268 358 314    Patient Profile     65 y.o. male with past medical history aortic dissection repair, previous cardiomyopathy improved, persistent atrial fibrillation,  hypertension admitted with diarrhea, leg pain with fasciitis, fever.  Found to be bacteremic and also in atrial fibrillation with elevated rate.  Cardiology asked to evaluate.  Echocardiogram shows ejection fraction 60 to 65%, severe left ventricular hypertrophy, grade 2 diastolic dysfunction, biatrial enlargement. Had AKA for necrotizing fasciitis.  Assessment & Plan    1 bacteremia/necrotizing fasciititis-previous TEE showed no vegetation. - s/p L AKA and revision 01/14 - on ABX per ID/IM    2 persistent atrial fibrillation- - labetalol at home dose, Cardizem added - s/p spontaneous conversion to SR - need IM/Ortho to say when we can start anticoag  3 acute kidney injury felt 2nd Rhabdo - - BUN/Cr as high as 91/7.61, improved, now 21/1.48 - CK peak 24,138, now 307 - per IM  4 s/p left AKA-  - Ortho saw last 01/15 - per IM  5 history of aortic arch dissection repair- - BP  control important - meds adjusted, but BP still elevated - HR still > 80, will increase Cardizem to 240 mg qd  6 hypertension- - SBP 140s-170s last 24 hr - labetalol increased to home dose 01/17 - on Cardizem CD 180 >> 240 mg  7 anemia and other issues, per IM  For questions or updates, please contact [facility] Please consult [email or web address] for contact info under     Signed, Theodore Demark, PA-C  09/13/2020, 8:09 AM

## 2020-09-13 NOTE — Progress Notes (Signed)
ANTICOAGULATION CONSULT NOTE - Follow-Up Consult  Pharmacy Consult for IV Heparin Indication: atrial fibrillation  No Known Allergies  Patient Measurements: Height: 5\' 11"  (180.3 cm) Weight: 113.9 kg (251 lb 1.7 oz) IBW/kg (Calculated) : 75.3 Heparin Dosing Weight: 100 kg  Vital Signs: Temp: 99.4 F (37.4 C) (01/18 1300) Temp Source: Oral (01/18 1300) BP: 125/68 (01/18 1300) Pulse Rate: 77 (01/18 1300)  Labs: Recent Labs    09/11/20 0302 09/12/20 0209 09/13/20 0352  HGB 7.3* 8.0* 8.1*  HCT 22.5* 26.5* 25.2*  PLT 268 358 314  CREATININE 1.90* 1.67* 1.48*  CKTOTAL 390 363 307    Estimated Creatinine Clearance: 65.5 mL/min (A) (by C-G formula based on SCr of 1.48 mg/dL (H)).   Medical History: Past Medical History:  Diagnosis Date  . AAA (abdominal aortic aneurysm) (HCC)   . History of open heart surgery   . Hypertension   . Seroma due to trauma [facility])     Assessment: 65 yr old male with hx Afib on apixaban 5 mg bid PTA  was admitted with bacteremia. Apixaban was on hold for possible procedures during acute illness and pt was started on IV heparin. IV heparin was briefly stopped on 1/10, with plans to transition back to apixaban, but IV heparin was resumed, as patient will need orthopedic intervention. Pt is S/P L leg debridement, fasciotomies. Heparin has been on hold for anemia post surgery.   Heparin to resume this afternoon, will restart at 2100 units/hr and follow up level and cbc to ensure no bleeding complications.  Goal of Therapy:  Heparin level 0.3-0.7 units/ml Monitor platelets by anticoagulation protocol: Yes   Plan:  Restart heparin infusion at 2300 units/hr Check heparin level in 8 hours Monitor daily HL, CBC, s/sx bleeding  3/10 PharmD., BCPS Clinical Pharmacist 09/13/2020 2:13 PM

## 2020-09-13 NOTE — Progress Notes (Signed)
PROGRESS NOTE    Kaled Allende  BTD:[id number] DOB: 1956/06/15 DOA: 08/30/2020 PCP: Patient, No Pcp Per   Brief Narrative:  Brandom Kerwin is a 65 y.o. male with PMH significant for HTN, CKD 3, A. 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  Patient presented to the ED on 1/4 with 1 week history of nausea, vomiting, chills followed by worsening left leg pain and swelling.  In the ED, he was noted to be in severe sepsis with WBC count elevated to 15, lactic acid 2, creatinine 4.2 with a baseline of 1.3, potassium low at 2.8.  CK level was elevated more than 28,000. Ultrasound rule out DVT.   CT scan of left leg showed soft tissue swelling but no muscle damage and no signs of abscess although limited due to lack of contrast and CT due to renal function.  MRI noted signs of myofascitis.  Patient was admitted to hospitalist service for cellulitis, rhabdomyolysis, AKI. Orthopedic and nephrology consultation were obtained. Blood cultures sent on admission grew Streptococcus group G, cellulitis likely being the source.    Over the course of hospitalization, patient had improvement in parameters for sepsis, rhabdomyolysis, AKI.  However his left leg continued to remain swollen and tender. Orthopedic consultation was obtained with Dr. Sharol Given. 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  09/10/2020 his blood count dropped again in 1-6.7 so we will type and screen H&H is 1 unit of PRBCs.  After infusion his hemoglobin appropriate responded to 7.5 -He continues to have pain so we will add Robaxin IV.  CR feels he is a good candidate given his functional decline and they have placed a rehab consult and if  approved he will go to CIR but patient is conflicted about going as he wants to go home and care for his cousin who is special needs.  He is now agreeable to going to rehab and will discuss with Dr. Sharol Given about resuming anticoagulation.  Cardiology has increased his Cardizem to 240 mg p.o. daily.  Anemia appears to be improving with Dr. Sharol Given receptors on heparin and then evaluate his hemoglobin and if it remains stable will transition back to Eliquis for discharge.  Assessment & Plan:   Principal Problem:   Severe sepsis with acute organ dysfunction due to Streptococcus species [facility]) Active Problems:   Persistent atrial fibrillation (HCC)   Hypertension   Cellulitis of left leg   Lactic acidosis   Elevated troponin   Hypoalbuminemia   Leukocytosis   Thrombocytopenia (HCC)   Hypokalemia   Hyperglycemia   Transaminitis   Total bilirubin, elevated   AKI (acute kidney injury) (HCC)   Nausea vomiting and diarrhea   CKD (chronic kidney disease), stage III (HCC)   Morbid obesity (HCC)   Sepsis with acute renal failure without septic shock ([place])   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)  Necrotizing fasciitis of left leg -Initially presented as left leg cellulitis.  Also had evidence of myositis on MRI and rhabdomyolysis. -Sepsis parameters improved with antibiotics but left leg continues to remain swollen and tender. -Orthopedic consultation was obtained with Dr. Sharol Given. 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 -Currently on gabapentin and Dilaudid 1 mg IV every 2 hours as needed for pain control and p.o. oxycodone IR 10 mg every 4 hours as needed. May need adjustment -Underwent Revision of AKA 09/09/2020 -Previous culture showed group B strep and he is status post amputation for source control.  ID is now recommending continue IV vancomycin while inpatient and discharged changed to oral  amoxicillin 5 mg 3 times daily through 09/16/2020 and stopping antibiotics then -Continue pain control and have added IV Robaxin and this seems to help.  Pain was still overall controlled and will need to make further adjustments if necessary -PT OT recommending CIR and may be a candidate but patient is to make a decision of whether he wants to go or not.  Bacteremia with Streptococcus group G -Left leg cellulitis/myositis probably the source.   -Currently on IV Rocephin.  Repeat blood culture from 1/8 did not show any growth.   -TEE 1/10 did not show any evidence of vegetation.  -ID consult appreciated.  We will continue IV ceftriaxone as an inpatient and at discharge will transition to p.o. amoxicillin 500 mg 3 times daily through 09/16/2020  Severe sepsis - POA -Secondary to cellulitis.  Sepsis resolved.   -WBC count slightly bumped yesterdayin response to surgery and is now down to 16.3 Last Labs              Recent Labs  Lab 09/01/20 1026 09/01/20 1538 09/02/20 0416 09/03/20 0419 09/04/20 1446 09/05/20 0420 09/06/20 0441 09/07/20 0019 09/08/20 0159  WBC  --    < > 22.8* 25.8* 20.4* 18.9* 14.9* 14.5* 16.9*  LATICACIDVEN 0.9  --   --   --   --   --   --   --   --   PROCALCITON 43.41  --  34.97 20.62 7.47 5.24 2.99  --   --    < > = values in this interval not displayed.    -WBC trended up to 17.2 on 09/10/2020 and started trending down and trended down to 12.7 but slightly bumped and went to 14.7  Acute kidney injury -Baseline creatinine less than 1.4.  Patient with elevated creatinine of 4.2, peaked at 7.6 on 1/7.  AKI to be due to acute tubular injury and decreased renal perfusion in the setting of rhabdomyolysis. Creatinine improving gradually.  Currently being monitored off fluid. -BUN/Cr is improving and has gone from 29/1.86 -> 28/2.04 -> 24/1.90 -> 20/1.67 -> and is now 21/1.48  Rhabdomyolysis  Elevated transaminases -Secondary to left leg myofascitis -CK level and  transaminases level are trending down as below with IV fluid. -Currently off IV fluid Abx Last Labs            Recent Labs  Lab 09/02/20 0416 09/03/20 0419 09/04/20 1446 09/05/20 0420 09/06/20 0441 09/07/20 0019 09/08/20 0159  CKTOTAL 96,295* 8,688* 3,244* 2,084* 1,066* 803* 660*    -CAD now normalized and trended down to 307 today  Hepatic Function Latest Ref Rng & Units 09/13/2020 09/12/2020 09/11/2020  Total Protein 6.5 - 8.1 g/dL 6.7 6.5 6.1(L)  Albumin 3.5 - 5.0 g/dL 2.4(L) 2.3(L) 2.1(L)  AST 15 - 41 U/L _0 ALT 0 - 44 U/L _1 Alk Phosphatase 38 - 126 U/L 63 58 53  Total Bilirubin 0.3 - 1.2 mg/dL 0.8 0.8 0.7  Bilirubin, Direct 0.0 - 0.2 mg/dL - - -   Acute anemia/acute blood loss anemia -Unclear cause. No active bleeding but hemoglobin  is running low. -received a total of 5 units of PRBCs this admission -Hemoglobin further in the setting of his repeat surgical intervention likely due to surgical blood loss -Iron, ferritin level normal.  Probably not suggestive of chronic GI bleeding. -Hb/Hct now improving after the last unit of blood and is now 8.1/25.2 -Continue to Monitor for S/Sx of Bleeding and will resume heparin drip now; currently no overt bleeding noted -Repeat CBC in the a.m.  Essential hypertension Chronic diastolic dysfunction -Home meds include labetalol 200 mg twice daily, nifedipine 90 mg daily, Aldactone 25 mg daily, valsartan 80 mg daily -Meds initially held because of sepsis.  -Currently on labetalol 200 mg twice daily and nifedipine 90 mg daily.  Because of A. fib, cardiology and switched him from nifedipine to Cardizem this morning.  Continue to monitor -Strict I's and O's and daily weights; Patient is + 8.531 L -Echo from 1/6 with EF 60 to 65% and grade 2 diastolic dysfunction.  Persistent A. Fib status post conversion into Sinus Rhythm -Patient has history of A. fib and DC cardioversion. -On admission, he was noted to be in A. fib and  was started on amiodarone drip. Currently off amiodarone drip.  On labetalol.  Also added Cardizem and is on 30 mg q6h and cardiology is consulted at this and made once a day as below -Prior to admission, patient was on Eliquis for anticoagulation.  In the hospital, he has remained on heparin drip but held due to anemia.  Plan to reinitiate Eliquis prior to discharge once his hemoglobin stabilizes.   -Continue to Monitor on Telemetry ' -Cardiology is following and they have increased his Cardizem and recommended continue labetalol.  He was also changed to Cardizem CD 180 mg p.o. daily and this was uptitrated to 240 mg daily and they are recommending resuming apixaban when able but we will resume heparin drip for now and then transition back to apixaban at discharge as we will need to monitor if he bleeds; Ortho has cleared the patient for anticoagulation. -Dr. Stanford Breed believes that the patient is essentially asymptomatic and the rate control and anticoagulation will be the best long-term option for this patient  History of type aortic arch dissection  -He is status post repair in 2016 and had dissection at anastomosis suture line seen December 2019.  Course was complicated by cardiogenic shock with EF of 15%.  Ultimately underwent total arch replacement in February 2020 while at [place].  -Continue aspirin and Eliquis but was on Heparin gtt which has been held given Anemia. ?Not on a statin. -Lipid panel with HDL less than 10. Will discuss with cardiology about any specific statin/fibrate he will benefit from.  Chronic hypokalemia Hypomagnesemia -Potassium level running low mostly between 3-3.5. -K+ is now 4.6 -> 5.3 and has improved to 4.4 -Mag was 1.7 this morning replete with IV mag sulfate 2 g -Continue to Monitor and Trend  -Repeat CMP and Mag in the AM   Hyperglycemia -No history of diabetes.  A1c 4.6 on 1/7. -Fingersticks consistently less than 150; blood sugars ranging from 96-1 64  on daily BMP/CMP No results for input(s): GLUCAP in the last 72 hours.   Obesity -Complicates overall prognosis and care -Estimated body mass index is 35.02 kg/m as calculated from the following:   Height as of this encounter: 5' 11" (1.803 m).   Weight as of this encounter: 113.9 kg. -Weight Loss and Dietary Counseling given   DVT prophylaxis: SCDs, Will discuss with Ortho  and cardiology about resuming Heparin gtt and they are agreeable and will need to transition to apixaban at discharge Code Status: FULL CODE  Family Communication: No family present at bedside  Disposition Plan: CIR when bed is available  Status is: Inpatient  Remains inpatient appropriate because:Unsafe d/c plan, IV treatments appropriate due to intensity of illness or inability to take PO and Inpatient level of care appropriate due to severity of illness   Dispo: The patient is from: Home              Anticipated d/c is to: CIR              Anticipated d/c date is: 2 days              Patient currently is not medically stable to d/c.   Consultants:   Nephrology  Cardiology  Infectious Diseases  Orthopedic Surgery  General Surgery    Procedures:  REVISION LEFT ABOVE KNEE AMPUTATION on 0/92/33 Application of Prevena customizable and Arthur form wound VAC  Antimicrobials:  Anti-infectives (From admission, onward)   Start     Dose/Rate Route Frequency Ordered Stop   09/09/20 0600  ceFAZolin (ANCEF) IVPB 2g/100 mL premix        2 g 200 mL/hr over 30 Minutes Intravenous To Short Stay 09/09/20 0213 09/09/20 1103   09/08/20 1000  linezolid (ZYVOX) IVPB 600 mg  Status:  Discontinued        600 mg 300 mL/hr over 60 Minutes Intravenous Every 12 hours 09/08/20 0838 09/11/20 1104   09/07/20 1015  ceFAZolin (ANCEF) 3 g in dextrose 5 % 50 mL IVPB  Status:  Discontinued        3 g 100 mL/hr over 30 Minutes Intravenous On call to O.R. 09/07/20 1009 09/07/20 1043   09/07/20 0600  ceFAZolin (ANCEF) IVPB  2g/100 mL premix        2 g 200 mL/hr over 30 Minutes Intravenous On call to O.R. 09/06/20 1651 09/07/20 0855   09/02/20 2200  cefTRIAXone (ROCEPHIN) 2 g in sodium chloride 0.9 % 100 mL IVPB        2 g 200 mL/hr over 30 Minutes Intravenous Every 24 hours 09/02/20 1044     09/01/20 2200  vancomycin (VANCOREADY) IVPB 1500 mg/300 mL  Status:  Discontinued        1,500 mg 150 mL/hr over 120 Minutes Intravenous Every 48 hours 08/31/20 0350 08/31/20 1719   09/01/20 0130  ceFEPIme (MAXIPIME) 2 g in sodium chloride 0.9 % 100 mL IVPB  Status:  Discontinued        2 g 200 mL/hr over 30 Minutes Intravenous Every 24 hours 08/31/20 1812 09/02/20 1043   09/01/20 0100  metroNIDAZOLE (FLAGYL) IVPB 500 mg  Status:  Discontinued        500 mg 100 mL/hr over 60 Minutes Intravenous Every 8 hours 08/31/20 1751 08/31/20 1926   08/31/20 2200  ceFEPIme (MAXIPIME) 2 g in sodium chloride 0.9 % 100 mL IVPB  Status:  Discontinued        2 g 200 mL/hr over 30 Minutes Intravenous Every 24 hours 08/31/20 0350 08/31/20 1719   08/31/20 2030  vancomycin (VANCOREADY) IVPB 1500 mg/300 mL        1,500 mg 150 mL/hr over 120 Minutes Intravenous  Once 08/31/20 2016 08/31/20 2338   08/31/20 2015  clindamycin (CLEOCIN) IVPB 900 mg  Status:  Discontinued        900 mg 100 mL/hr over  30 Minutes Intravenous Every 8 hours 08/31/20 1926 09/01/20 1033   08/31/20 1812  vancomycin variable dose per unstable renal function (pharmacist dosing)  Status:  Discontinued         Does not apply See admin instructions 08/31/20 1812 09/01/20 1033   08/31/20 1730  cefTRIAXone (ROCEPHIN) 2 g in sodium chloride 0.9 % 100 mL IVPB  Status:  Discontinued        2 g 200 mL/hr over 30 Minutes Intravenous Every 24 hours 08/31/20 1720 08/31/20 1751   08/31/20 1015  metroNIDAZOLE (FLAGYL) IVPB 500 mg  Status:  Discontinued        500 mg 100 mL/hr over 60 Minutes Intravenous Every 8 hours 08/31/20 1012 08/31/20 1719   08/31/20 0045  ceFEPIme (MAXIPIME) 2  g in sodium chloride 0.9 % 100 mL IVPB        2 g 200 mL/hr over 30 Minutes Intravenous  Once 08/31/20 0030 08/31/20 0208   08/31/20 0000  vancomycin (VANCOREADY) IVPB 1500 mg/300 mL        1,500 mg 150 mL/hr over 120 Minutes Intravenous  Once 08/30/20 2351 08/31/20 0208   08/30/20 1900  cefTRIAXone (ROCEPHIN) 2 g in sodium chloride 0.9 % 100 mL IVPB  Status:  Discontinued        2 g 200 mL/hr over 30 Minutes Intravenous Every 24 hours 08/30/20 1855 08/31/20 0030       Subjective: Seen and examined at bedside and he is still complaining about some leg pain.  I discussed with him about his labs improving and he was happy about this.  No nausea or vomiting.  Worked with therapy and had some pain after worked with therapy.  He is agreeable for CIR now.  Denies any other concerns or complaints at this time.  Objective: Vitals:   09/12/20 0838 09/12/20 1432 09/12/20 2100 09/13/20 0427  BP: (!) 167/82 (!) 172/83 (!) 164/97 (!) 145/88  Pulse: 86 84 100 88  Resp:   18 18  Temp:   99.4 F (37.4 C) 98.9 F (37.2 C)  TempSrc:   Oral Oral  SpO2: 91% 95% 93% 92%  Weight:      Height:        Intake/Output Summary (Last 24 hours) at 09/13/2020 1335 Last data filed at 09/13/2020 0556 Gross per 24 hour  Intake 2158.02 ml  Output 2325 ml  Net -166.98 ml   Filed Weights   09/08/20 0405 09/09/20 0406 09/11/20 0432  Weight: 112.3 kg 109.6 kg 113.9 kg   Examination: Physical Exam:  Constitutional: WN/WD obese African-American male currently in no acute distress appears calm but does appear uncomfortable complaining of some left leg stump pain. Eyes: Lids and conjunctivae normal, sclerae anicteric  ENMT: External Ears, Nose appear normal. Grossly normal hearing. Neck: Appears normal, supple, no cervical masses, normal ROM, no appreciable thyromegaly; no JVD Respiratory: Diminished to auscultation bilaterally, no wheezing, rales, rhonchi or crackles. Normal respiratory effort and patient is not  tachypenic. No accessory muscle use.  Unlabored breathing Cardiovascular: RRR with rates in the 80s, no murmurs / rubs / gallops. S1 and S2 auscultated.  Very minimal left extremity edema Abdomen: Soft, non-tender, distended secondary to body habitus.  Bowel sounds positive.  GU: Deferred. Musculoskeletal: Has a left AKA connected to the wound VAC Skin: No rashes, lesions, ulcers on limited skin evaluation. No induration; Warm and dry.  Neurologic: CN 2-12 grossly intact with no focal deficits. Romberg sign and cerebellar reflexes not assessed.  Psychiatric: Normal judgment and insight. Alert and oriented x 3. Normal mood and appropriate affect.   Data Reviewed: I have personally reviewed following labs and imaging studies  CBC: Recent Labs  Lab 09/09/20 0531 09/09/20 1036 09/10/20 0314 09/10/20 1527 09/11/20 0302 09/12/20 0209 09/13/20 0352  WBC 16.3*  --  17.2*  --  14.6* 12.7* 14.7*  NEUTROABS 11.7*  --  14.7*  --  11.1* 8.9* 11.5*  HGB 6.9*   < > 6.7* 7.5* 7.3* 8.0* 8.1*  HCT 22.4*   < > 21.1* 24.1* 22.5* 26.5* 25.2*  MCV 94.9  --  94.2  --  96.2 99.3 97.3  PLT 462*  --  392  --  268 358 314   < > = values in this interval not displayed.   Basic Metabolic Panel: Recent Labs  Lab 09/08/20 0159 09/09/20 0108 09/09/20 1036 09/10/20 0314 09/11/20 0302 09/12/20 0209 09/13/20 0352  NA 145 143 145 144 145 145 141  K 3.6 4.6 5.3* 4.6 3.9 4.4 4.4  CL 110 109  --  109 110 110 107  CO2 24 21*  --  _0 GLUCOSE 116* 83  --  164* 120* 96 103*  BUN 40* 29*  --  28* 24* 20 21  CREATININE 1.94* 1.86*  --  2.04* 1.90* 1.67* 1.48*  CALCIUM 7.8* 7.7*  --  7.7* 7.3* 7.9* 7.9*  MG 1.4*  --   --  1.6* 1.4* 1.6* 1.7  PHOS 3.2  --   --  3.1 3.4 3.3 3.5   GFR: Estimated Creatinine Clearance: 65.5 mL/min (A) (by C-G formula based on SCr of 1.48 mg/dL (H)). Liver Function Tests: Recent Labs  Lab 09/09/20 0108 09/10/20 0314 09/11/20 0302 09/12/20 0209 09/13/20 0352  AST _1 ALT _2 ALKPHOS 64 60 53 58 63  BILITOT 0.9 0.8 0.7 0.8 0.8  PROT 5.9* 5.9* 6.1* 6.5 6.7  ALBUMIN 2.0* 2.0* 2.1* 2.3* 2.4*   No results for input(s): LIPASE, AMYLASE in the last 168 hours. No results for input(s): AMMONIA in the last 168 hours. Coagulation Profile: No results for input(s): INR, PROTIME in the last 168 hours. Cardiac Enzymes: Recent Labs  Lab 09/09/20 0108 09/10/20 0314 09/11/20 0302 09/12/20 0209 09/13/20 0352  CKTOTAL 428* 474* 390 363 307   BNP (last 3 results) No results for input(s): PROBNP in the last 8760 hours. HbA1C: No results for input(s): HGBA1C in the last 72 hours. CBG: Recent Labs  Lab 09/09/20 1120  GLUCAP 112*   Lipid Profile: No results for input(s): CHOL, HDL, LDLCALC, TRIG, CHOLHDL, LDLDIRECT in the last 72 hours. Thyroid Function Tests: No results for input(s): TSH, T4TOTAL, FREET4, T3FREE, THYROIDAB in the last 72 hours. Anemia Panel: No results for input(s): VITAMINB12, FOLATE, FERRITIN, TIBC, IRON, RETICCTPCT in the last 72 hours. Sepsis Labs: No results for input(s): PROCALCITON, LATICACIDVEN in the last 168 hours.  Recent Results (from the past 240 hour(s))  Culture, blood (routine x 2)     Status: None   Collection Time: 09/03/20  5:38 PM   Specimen: BLOOD RIGHT HAND  Result Value Ref Range Status   Specimen Description BLOOD RIGHT HAND  Final   Special Requests   Final    AEROBIC BOTTLE ONLY Blood Culture results may not be optimal due to an inadequate volume of blood received in culture bottles   Culture   Final    NO GROWTH  5 DAYS Performed at [facility], [place] [address], [place], [place] [address]    Report Status 09/08/2020 FINAL  Final  Culture, blood (routine x 2)     Status: None   Collection Time: 09/03/20  5:39 PM   Specimen: BLOOD RIGHT HAND  Result Value Ref Range Status   Specimen Description BLOOD RIGHT HAND  Final   Special Requests   Final    AEROBIC BOTTLE ONLY Blood  Culture results may not be optimal due to an inadequate volume of blood received in culture bottles   Culture   Final    NO GROWTH 5 DAYS Performed at [facility], [place] [address] [place], [place] [address]    Report Status 09/08/2020 FINAL  Final  Aerobic/Anaerobic Culture (surgical/deep wound)     Status: None   Collection Time: 09/07/20  9:02 AM   Specimen: Leg, Left; Tissue  Result Value Ref Range Status   Specimen Description TISSUE  Final   Special Requests LEFT THIGH FACIA SPEC A  Final   Gram Stain NO WBC SEEN NO ORGANISMS SEEN   Final   Culture   Final    No growth aerobically or anaerobically. Performed at [facility], [place] [address] [place], [place] [address]    Report Status 09/12/2020 FINAL  Final  Aerobic/Anaerobic Culture (surgical/deep wound)     Status: None   Collection Time: 09/07/20  9:05 AM   Specimen: Leg, Left; Tissue  Result Value Ref Range Status   Specimen Description TISSUE  Final   Special Requests LEFT CALF MUSCLE SPEC B  Final   Gram Stain   Final    RARE WBC PRESENT, PREDOMINANTLY MONONUCLEAR NO ORGANISMS SEEN    Culture   Final    No growth aerobically or anaerobically. Performed at [facility], [place] [address], [place], [place] [address]    Report Status 09/12/2020 FINAL  Final  Surgical pcr screen     Status: None   Collection Time: 09/09/20  4:22 AM   Specimen: Nasal Mucosa; Nasal Swab  Result Value Ref Range Status   MRSA, PCR NEGATIVE NEGATIVE Final   Staphylococcus aureus NEGATIVE NEGATIVE Final    Comment: (NOTE) The Xpert SA Assay (FDA approved for NASAL specimens in patients 72 years of age and older), is one component of a comprehensive surveillance program. It is not intended to diagnose infection nor to guide or monitor treatment. Performed at [facility], [place] [address] [place], [place] [address]     RN Pressure Injury Documentation:     Estimated body mass index is 35.02 kg/m as calculated  from the following:   Height as of this encounter: 5' 11" (1.803 m).   Weight as of this encounter: 113.9 kg.  Malnutrition Type:   Malnutrition Characteristics:   Nutrition Interventions    Radiology Studies: No results found.  Scheduled Meds: . aspirin EC  81 mg Oral Daily  . diltiazem  240 mg Oral Daily  . feeding supplement  237 mL Oral BID BM  . gabapentin  300 mg Oral TID  . labetalol  400 mg Oral BID  . mouth rinse  15 mL Mouth Rinse BID  . sodium chloride flush  10-40 mL Intracatheter Q12H   Continuous Infusions: . sodium chloride Stopped (09/12/20 1223)  . cefTRIAXone (ROCEPHIN)  IV 2 g (09/12/20 2211)  . methocarbamol (ROBAXIN) IV Stopped (09/11/20 2305)    LOS: 14 days  Kerney Elbe, DO [facility] PAGER is on AMION  If 7PM-7AM, please contact night-coverage [email or web address]

## 2020-09-13 NOTE — Progress Notes (Signed)
Physical Therapy Treatment Patient Details Name: Connor [name] MRN: [id number] DOB: [birth year] Today's Date: 09/13/2020    History of Present Illness 65 yo male presenting originally for nausea/vomiting, fever, and LLE pain and swelling. Pt found to have sepsis and rhabdomyolysis stemming from LLE cellulitis/myofascitis. 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  Had revision of surgery on 1/14, now re assessed by PT.  PMH includes: AAA, CABG, and HTN.    PT Comments    Patient drowsy upon arrival due to recently having been given pain meds; continues to report 10/10 pain in left residual limb. Pt requires Min-mod A to stand from different surfaces depending on height and unsteady once upright. Tolerated short distance ambulation with Min A for balance/safety but fatigues easily. Pt with complete LOB during gait requiring total A to lower into chair. VSS on RA with 2/4 DOE. Reviewed proper positioning of left residual limb to decrease chances of hip flexion contractures. Pt is a high fall risk. Continue to recommend CIR. Will follow.   Follow Up Recommendations  CIR     Equipment Recommendations  Rolling walker with 5" wheels;Wheelchair (measurements PT);Wheelchair cushion (measurements PT)    Recommendations for Other Services       Precautions / Restrictions Precautions Precautions: Fall Precaution Comments: LLE wound vac Restrictions Weight Bearing Restrictions: No LLE Weight Bearing: Non weight bearing    Mobility  Bed Mobility Overal bed mobility: Needs Assistance Bed Mobility: Supine to Sit     Supine to sit: Min guard;HOB elevated Sit to supine: Supervision;HOB elevated   General bed mobility comments: Incr time and effort but no physical assist, heavy use of rail for support.  Transfers Overall transfer level: Needs assistance Equipment used: Rolling walker (2 wheeled) Transfers: Sit to/from Stand Sit to Stand: Mod assist;From elevated surface;Min  assist         General transfer comment: Min-mod A to stand from elevated bed height vs low chair; unsteady upon standing. Cues to extend left residual limb. Stood from [employer], from chair x1.  Ambulation/Gait Ambulation/Gait assistance: Min assist;Total assist;+2 safety/equipment;+2 physical assistance Gait Distance (Feet): 15 Feet (+6') Assistive device: Rolling walker (2 wheeled) Gait Pattern/deviations:  ("hop to") Gait velocity: decr   General Gait Details: Slow, short hopping steps with 1 complete LOB requiring total A to lower down into chair; fatigues easily today. 2/4 DOE.   Stairs             Wheelchair Mobility    Modified Rankin (Stroke Patients Only)       Balance Overall balance assessment: Needs assistance Sitting-balance support: No upper extremity supported (foot supported) Sitting balance-Leahy Scale: Poor Sitting balance - Comments: close min guard-Min A for safety; posterior lean at times.   Standing balance support: During functional activity Standing balance-Leahy Scale: Poor Standing balance comment: Requires UE support and external support.                            Cognition Arousal/Alertness: Lethargic Behavior During Therapy: Flat affect Overall Cognitive Status: No family/caregiver present to determine baseline cognitive functioning Area of Impairment: Problem solving;Safety/judgement                         Safety/Judgement: Decreased awareness of safety   Problem Solving: Slow processing General Comments: Some drowsiness noted; given pain meds prior to arrival. Increased time to follow commands with repetition needed at times.  Exercises      General Comments General comments (skin integrity, edema, etc.): VSS on RA.      Pertinent Vitals/Pain Pain Assessment: 0-10 Pain Score: 10-Worst pain ever Pain Location: LLE Pain Descriptors / Indicators: Aching;Sore Pain Intervention(s): Premedicated  before session;Monitored during session;Limited activity within patient's tolerance;Repositioned    Home Living                      Prior Function            PT Goals (current goals can now be found in the care plan section) Progress towards PT goals: Not progressing toward goals - comment (due to drowsiness/weakness)    Frequency    Min 3X/week      PT Plan Current plan remains appropriate    Co-evaluation              AM-PAC PT "6 Clicks" Mobility   Outcome Measure  Help needed turning from your back to your side while in a flat bed without using bedrails?: A Little Help needed moving from lying on your back to sitting on the side of a flat bed without using bedrails?: A Little Help needed moving to and from a bed to a chair (including a wheelchair)?: A Lot Help needed standing up from a chair using your arms (e.g., wheelchair or bedside chair)?: A Lot Help needed to walk in hospital room?: A Little Help needed climbing 3-5 steps with a railing? : Total 6 Click Score: 14    End of Session Equipment Utilized During Treatment: Gait belt Activity Tolerance: Patient limited by lethargy Patient left: in bed;with call bell/phone within reach;with bed alarm set Nurse Communication: Mobility status PT Visit Diagnosis: Unsteadiness on feet (R26.81);Other abnormalities of gait and mobility (R26.89);Muscle weakness (generalized) (M62.81)     Time: 9381-8299 PT Time Calculation (min) (ACUTE ONLY): 27 min  Charges:  $Gait Training: 8-22 mins $Therapeutic Activity: 8-22 mins                     Vale Haven, PT, DPT Acute Rehabilitation Services Pager [phone] Office [phone]       Blake Divine A Lanier Ensign 09/13/2020, 12:12 PM

## 2020-09-14 DIAGNOSIS — R52 Pain, unspecified: Secondary | ICD-10-CM

## 2020-09-14 LAB — COMPREHENSIVE METABOLIC PANEL
ALT: 15 U/L (ref 0–44)
AST: 21 U/L (ref 15–41)
Albumin: 2.2 g/dL — ABNORMAL LOW (ref 3.5–5.0)
Alkaline Phosphatase: 63 U/L (ref 38–126)
Anion gap: 10 (ref 5–15)
BUN: 24 mg/dL — ABNORMAL HIGH (ref 8–23)
CO2: 23 mmol/L (ref 22–32)
Calcium: 7.8 mg/dL — ABNORMAL LOW (ref 8.9–10.3)
Chloride: 107 mmol/L (ref 98–111)
Creatinine, Ser: 1.7 mg/dL — ABNORMAL HIGH (ref 0.61–1.24)
GFR, Estimated: 45 mL/min — ABNORMAL LOW (ref >60)
Glucose, Bld: 96 mg/dL (ref 70–99)
Potassium: 4.4 mmol/L (ref 3.5–5.1)
Sodium: 140 mmol/L (ref 135–145)
Total Bilirubin: 1.1 mg/dL (ref 0.3–1.2)
Total Protein: 5.9 g/dL — ABNORMAL LOW (ref 6.5–8.1)

## 2020-09-14 LAB — CBC WITH DIFFERENTIAL/PLATELET
Abs Immature Granulocytes: 0.14 10*3/uL — ABNORMAL HIGH (ref 0.00–0.07)
Basophils Absolute: 0 10*3/uL (ref 0.0–0.1)
Basophils Relative: 0 %
Eosinophils Absolute: 0.1 10*3/uL (ref 0.0–0.5)
Eosinophils Relative: 0 %
HCT: 22.7 % — ABNORMAL LOW (ref 39.0–52.0)
Hemoglobin: 7.2 g/dL — ABNORMAL LOW (ref 13.0–17.0)
Immature Granulocytes: 1 %
Lymphocytes Relative: 11 %
Lymphs Abs: 1.6 10*3/uL (ref 0.7–4.0)
MCH: 31.2 pg (ref 26.0–34.0)
MCHC: 31.7 g/dL (ref 30.0–36.0)
MCV: 98.3 fL (ref 80.0–100.0)
Monocytes Absolute: 1.8 10*3/uL — ABNORMAL HIGH (ref 0.1–1.0)
Monocytes Relative: 12 %
Neutro Abs: 11.3 10*3/uL — ABNORMAL HIGH (ref 1.7–7.7)
Neutrophils Relative %: 76 %
Platelets: 273 10*3/uL (ref 150–400)
RBC: 2.31 MIL/uL — ABNORMAL LOW (ref 4.22–5.81)
RDW: 17.4 % — ABNORMAL HIGH (ref 11.5–15.5)
WBC: 14.9 10*3/uL — ABNORMAL HIGH (ref 4.0–10.5)
nRBC: 0.3 % — ABNORMAL HIGH (ref 0.0–0.2)

## 2020-09-14 LAB — MAGNESIUM: Magnesium: 1.8 mg/dL (ref 1.7–2.4)

## 2020-09-14 LAB — HEPARIN LEVEL (UNFRACTIONATED): Heparin Unfractionated: 0.26 IU/mL — ABNORMAL LOW (ref 0.30–0.70)

## 2020-09-14 LAB — CK: Total CK: 238 U/L (ref 49–397)

## 2020-09-14 LAB — PHOSPHORUS: Phosphorus: 3.7 mg/dL (ref 2.5–4.6)

## 2020-09-14 MED ORDER — APIXABAN 5 MG PO TABS
5.0000 mg | ORAL_TABLET | Freq: Two times a day (BID) | ORAL | Status: DC
  Administered 2020-09-14 – 2020-09-16 (×6): 5 mg via ORAL
  Filled 2020-09-14 (×6): qty 1

## 2020-09-14 MED ORDER — OXYCODONE HCL ER 10 MG PO T12A
20.0000 mg | EXTENDED_RELEASE_TABLET | Freq: Two times a day (BID) | ORAL | Status: DC
  Administered 2020-09-14 – 2020-09-19 (×10): 20 mg via ORAL
  Filled 2020-09-14 (×10): qty 2

## 2020-09-14 MED ORDER — OXYCODONE HCL 5 MG PO TABS: 10.0000 mg | ORAL_TABLET | Freq: Four times a day (QID) | ORAL | Status: DC

## 2020-09-14 MED ORDER — GABAPENTIN 400 MG PO CAPS
400.0000 mg | ORAL_CAPSULE | Freq: Three times a day (TID) | ORAL | Status: DC
  Administered 2020-09-14 – 2020-09-19 (×15): 400 mg via ORAL
  Filled 2020-09-14 (×15): qty 1

## 2020-09-14 MED ORDER — OXYCODONE HCL 5 MG PO TABS
5.0000 mg | ORAL_TABLET | Freq: Four times a day (QID) | ORAL | Status: DC
  Administered 2020-09-14 – 2020-09-15 (×4): 5 mg via ORAL
  Filled 2020-09-14 (×4): qty 1

## 2020-09-14 NOTE — Progress Notes (Signed)
PROGRESS NOTE    Connor [name]   KRC:[id number]  DOB: Jan 22, 1956  DOA: 08/30/2020 PCP: Patient, No Pcp Per   Brief Narrative:  Connor [name] is a 65 y/o male with HTN, CKD 3, A-fib on Coumadin AAA, ascending aortic dissection s/p open rapair 2016 and redo on 2019, left command and external iliac stents 2020 complicated by cardiogenic shock and AKI requiring dialysis.   He presented to the ED with sepsis and rhabdomyolysis due to left leg cellulitis.  In ED > WBC 15, Lactic acid 2, CPK 28,000 and Cr of 4.2 (baseilne ~ 1.2- 1.4).  Hlater grew out strep group G in the blood. He improved overall however, his leg did not seem to improved. On 1/12, he was ultimately taken for an AKA due to ongoing necrotizing fascitis of the leg.  His hospital course has been complicated by recurrent anemia requiring a total of 5 U of PRBC.  He is on Heparin with plans to resume Eliquis as long as his Hb does not drop further.    Subjective: Severe pain in the stump. States he did not have meds overnight and woke up in severe pain today.    Assessment & Plan:   Principal Problem: Severe Sepsis with acute renal failure without septic shock    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 - now s/p AKA of left leg - cont Wound vac per general surgery  - continue either Ceftriaxone (while in hospital) or Amoxil (if discharged) through 1/21 - pain control remains an issue- he states he woke up in pain and it has yet to improve to a comfortable level- add Oyycontin 20 mg BID, Oxycodone > 5 mg QID routine, cont PRN IV Dilaudid- increased Nerontin from 300 mg TID to 400 mg today- follow for sedation  Active Problems:   A-fib - has been on Heparin due to the procedure- transitioning back to Eliquis today    AKI on CKD 3a - Cr rose from 4.21 to 7.61 on 17 and subsequently has improved to 1.48 on 1/18 - cause was likely sepsis complicated by a prerenal state (vomiting prior to admission) and  rhabdomyolysis    Nausea vomiting and diarrhea - on admission- has resolved      Morbid obesity (HCC)  Body mass index is 35.02 kg/m.    Time spent in minutes: 35 DVT prophylaxis: Eliquis Code Status: Full code Family Communication:  Disposition Plan:  Status is: Inpatient  Remains inpatient appropriate because:Inpatient level of care appropriate due to severity of illness   Dispo: The patient is from: Home              Anticipated d/c is to: CIR              Anticipated d/c date is: > 3 days              Patient currently is not medically stable to d/c.      Consultants:   Ortho  Cardiology  Nephrology  General surgery   ID Procedures:   Revision of left knee amputation Antimicrobials:  Anti-infectives (From admission, onward)   Start     Dose/Rate Route Frequency Ordered Stop   09/09/20 0600  ceFAZolin (ANCEF) IVPB 2g/100 mL premix        2 g 200 mL/hr over 30 Minutes Intravenous To Short Stay 09/09/20 0213 09/09/20 1103   09/08/20 1000  linezolid (ZYVOX) IVPB 600 mg  Status:  Discontinued        600  mg 300 mL/hr over 60 Minutes Intravenous Every 12 hours 09/08/20 0838 09/11/20 1104   09/07/20 1015  ceFAZolin (ANCEF) 3 g in dextrose 5 % 50 mL IVPB  Status:  Discontinued        3 g 100 mL/hr over 30 Minutes Intravenous On call to O.R. 09/07/20 1009 09/07/20 1043   09/07/20 0600  ceFAZolin (ANCEF) IVPB 2g/100 mL premix        2 g 200 mL/hr over 30 Minutes Intravenous On call to O.R. 09/06/20 1651 09/07/20 0855   09/02/20 2200  cefTRIAXone (ROCEPHIN) 2 g in sodium chloride 0.9 % 100 mL IVPB        2 g 200 mL/hr over 30 Minutes Intravenous Every 24 hours 09/02/20 1044     09/01/20 2200  vancomycin (VANCOREADY) IVPB 1500 mg/300 mL  Status:  Discontinued        1,500 mg 150 mL/hr over 120 Minutes Intravenous Every 48 hours 08/31/20 0350 08/31/20 1719   09/01/20 0130  ceFEPIme (MAXIPIME) 2 g in sodium chloride 0.9 % 100 mL IVPB  Status:  Discontinued         2 g 200 mL/hr over 30 Minutes Intravenous Every 24 hours 08/31/20 1812 09/02/20 1043   09/01/20 0100  metroNIDAZOLE (FLAGYL) IVPB 500 mg  Status:  Discontinued        500 mg 100 mL/hr over 60 Minutes Intravenous Every 8 hours 08/31/20 1751 08/31/20 1926   08/31/20 2200  ceFEPIme (MAXIPIME) 2 g in sodium chloride 0.9 % 100 mL IVPB  Status:  Discontinued        2 g 200 mL/hr over 30 Minutes Intravenous Every 24 hours 08/31/20 0350 08/31/20 1719   08/31/20 2030  vancomycin (VANCOREADY) IVPB 1500 mg/300 mL        1,500 mg 150 mL/hr over 120 Minutes Intravenous  Once 08/31/20 2016 08/31/20 2338   08/31/20 2015  clindamycin (CLEOCIN) IVPB 900 mg  Status:  Discontinued        900 mg 100 mL/hr over 30 Minutes Intravenous Every 8 hours 08/31/20 1926 09/01/20 1033   08/31/20 1812  vancomycin variable dose per unstable renal function (pharmacist dosing)  Status:  Discontinued         Does not apply See admin instructions 08/31/20 1812 09/01/20 1033   08/31/20 1730  cefTRIAXone (ROCEPHIN) 2 g in sodium chloride 0.9 % 100 mL IVPB  Status:  Discontinued        2 g 200 mL/hr over 30 Minutes Intravenous Every 24 hours 08/31/20 1720 08/31/20 1751   08/31/20 1015  metroNIDAZOLE (FLAGYL) IVPB 500 mg  Status:  Discontinued        500 mg 100 mL/hr over 60 Minutes Intravenous Every 8 hours 08/31/20 1012 08/31/20 1719   08/31/20 0045  ceFEPIme (MAXIPIME) 2 g in sodium chloride 0.9 % 100 mL IVPB        2 g 200 mL/hr over 30 Minutes Intravenous  Once 08/31/20 0030 08/31/20 0208   08/31/20 0000  vancomycin (VANCOREADY) IVPB 1500 mg/300 mL        1,500 mg 150 mL/hr over 120 Minutes Intravenous  Once 08/30/20 2351 08/31/20 0208   08/30/20 1900  cefTRIAXone (ROCEPHIN) 2 g in sodium chloride 0.9 % 100 mL IVPB  Status:  Discontinued        2 g 200 mL/hr over 30 Minutes Intravenous Every 24 hours 08/30/20 1855 08/31/20 0030       Objective: Vitals:   09/13/20 1300 09/13/20 2113  09/14/20 0353 09/14/20 0800   BP: 125/68 136/81 126/83 133/75  Pulse: 77  77 76  Resp: 17  18 18   Temp: 99.4 F (37.4 C) 99.1 F (37.3 C) 99 F (37.2 C) 98.8 F (37.1 C)  TempSrc: Oral Axillary Oral Oral  SpO2: 90% 90% 92% 93%  Weight:      Height:        Intake/Output Summary (Last 24 hours) at 09/14/2020 0945 Last data filed at 09/14/2020 0404 Gross per 24 hour  Intake 919.86 ml  Output 150 ml  Net 769.86 ml   Filed Weights   09/08/20 0405 09/09/20 0406 09/11/20 0432  Weight: 112.3 kg 109.6 kg 113.9 kg    Examination: General exam: Appears comfortable  HEENT: PERRLA, oral mucosa moist, no sclera icterus or thrush Respiratory system: Clear to auscultation. Respiratory effort normal. Cardiovascular system: S1 & S2 heard, RRR.   Gastrointestinal system: Abdomen soft, non-tender, nondistended. Normal bowel sounds. Central nervous system: Alert and oriented. No focal neurological deficits. Extremities: No cyanosis, clubbing or edema- left AKA Skin: No rashes or ulcers Psychiatry:  Mood & affect appropriate.     Data Reviewed: I have personally reviewed following labs and imaging studies  CBC: Recent Labs  Lab 09/10/20 0314 09/10/20 1527 09/11/20 0302 09/12/20 0209 09/13/20 0352 09/14/20 0404  WBC 17.2*  --  14.6* 12.7* 14.7* 14.9*  NEUTROABS 14.7*  --  11.1* 8.9* 11.5* 11.3*  HGB 6.7* 7.5* 7.3* 8.0* 8.1* 7.2*  HCT 21.1* 24.1* 22.5* 26.5* 25.2* 22.7*  MCV 94.2  --  96.2 99.3 97.3 98.3  PLT 392  --  268 358 314 273   Basic Metabolic Panel: Recent Labs  Lab 09/10/20 0314 09/11/20 0302 09/12/20 0209 09/13/20 0352 09/14/20 0404  NA 144 145 145 141 140  K 4.6 3.9 4.4 4.4 4.4  CL 109 110 110 107 107  CO2 26 25 26 25 23   GLUCOSE 164* 120* 96 103* 96  BUN 28* 24* 20 21 24*  CREATININE 2.04* 1.90* 1.67* 1.48* 1.70*  CALCIUM 7.7* 7.3* 7.9* 7.9* 7.8*  MG 1.6* 1.4* 1.6* 1.7 1.8  PHOS 3.1 3.4 3.3 3.5 3.7   GFR: Estimated Creatinine Clearance: 57.1 mL/min (A) (by C-G formula based on SCr  of 1.7 mg/dL (H)). Liver Function Tests: Recent Labs  Lab 09/10/20 0314 09/11/20 0302 09/12/20 0209 09/13/20 0352 09/14/20 0404  AST 24 22 22 22 21   ALT 17 16 18 17 15   ALKPHOS 60 53 58 63 63  BILITOT 0.8 0.7 0.8 0.8 1.1  PROT 5.9* 6.1* 6.5 6.7 5.9*  ALBUMIN 2.0* 2.1* 2.3* 2.4* 2.2*   No results for input(s): LIPASE, AMYLASE in the last 168 hours. No results for input(s): AMMONIA in the last 168 hours. Coagulation Profile: No results for input(s): INR, PROTIME in the last 168 hours. Cardiac Enzymes: Recent Labs  Lab 09/10/20 0314 09/11/20 0302 09/12/20 0209 09/13/20 0352 09/14/20 0404  CKTOTAL 474* 390 363 307 238   BNP (last 3 results) No results for input(s): PROBNP in the last 8760 hours. HbA1C: No results for input(s): HGBA1C in the last 72 hours. CBG: Recent Labs  Lab 09/09/20 1120  GLUCAP 112*   Lipid Profile: No results for input(s): CHOL, HDL, LDLCALC, TRIG, CHOLHDL, LDLDIRECT in the last 72 hours. Thyroid Function Tests: No results for input(s): TSH, T4TOTAL, FREET4, T3FREE, THYROIDAB in the last 72 hours. Anemia Panel: No results for input(s): VITAMINB12, FOLATE, FERRITIN, TIBC, IRON, RETICCTPCT in the last 72 hours. Urine analysis:  Component Value Date/Time   COLORURINE AMBER (A) 08/31/2020 0130   APPEARANCEUR CLOUDY (A) 08/31/2020 0130   LABSPEC 1.017 08/31/2020 0130   PHURINE 5.0 08/31/2020 0130   GLUCOSEU NEGATIVE 08/31/2020 0130   HGBUR LARGE (A) 08/31/2020 0130   BILIRUBINUR NEGATIVE 08/31/2020 0130   KETONESUR NEGATIVE 08/31/2020 0130   PROTEINUR 100 (A) 08/31/2020 0130   NITRITE NEGATIVE 08/31/2020 0130   LEUKOCYTESUR NEGATIVE 08/31/2020 0130   Sepsis Labs: @LABRCNTIP (procalcitonin:4,lacticidven:4) ) Recent Results (from the past 240 hour(s))  Aerobic/Anaerobic Culture (surgical/deep wound)     Status: None   Collection Time: 09/07/20  9:02 AM   Specimen: Leg, Left; Tissue  Result Value Ref Range Status   Specimen Description  TISSUE  Final   Special Requests LEFT THIGH FACIA SPEC A  Final   Gram Stain NO WBC SEEN NO ORGANISMS SEEN   Final   Culture   Final    No growth aerobically or anaerobically. Performed at [facility], [address], [place], [place]    Report Status 09/12/2020 FINAL  Final  Aerobic/Anaerobic Culture (surgical/deep wound)     Status: None   Collection Time: 09/07/20  9:05 AM   Specimen: Leg, Left; Tissue  Result Value Ref Range Status   Specimen Description TISSUE  Final   Special Requests LEFT CALF MUSCLE SPEC B  Final   Gram Stain   Final    RARE WBC PRESENT, PREDOMINANTLY MONONUCLEAR NO ORGANISMS SEEN    Culture   Final    No growth aerobically or anaerobically. Performed at [facility], [address] [place], [place]    Report Status 09/12/2020 FINAL  Final  Surgical pcr screen     Status: None   Collection Time: 09/09/20  4:22 AM   Specimen: Nasal Mucosa; Nasal Swab  Result Value Ref Range Status   MRSA, PCR NEGATIVE NEGATIVE Final   Staphylococcus aureus NEGATIVE NEGATIVE Final    Comment: (NOTE) The Xpert SA Assay (FDA approved for NASAL specimens in patients 53 years of age and older), is one component of a comprehensive surveillance program. It is not intended to diagnose infection nor to guide or monitor treatment. Performed at [facility], [address], [place], [place]          Radiology Studies: No results found.    Scheduled Meds: . aspirin EC  81 mg Oral Daily  . diltiazem  240 mg Oral Daily  . feeding supplement  237 mL Oral BID BM  . gabapentin  300 mg Oral TID  . labetalol  400 mg Oral BID  . mouth rinse  15 mL Mouth Rinse BID  . sodium chloride flush  10-40 mL Intracatheter Q12H   Continuous Infusions: . sodium chloride Stopped (09/13/20 1523)  . cefTRIAXone (ROCEPHIN)  IV Stopped (09/13/20 2149)  . heparin 2,400 Units/hr (09/14/20 0559)  . methocarbamol (ROBAXIN) IV Stopped  (09/11/20 2305)     LOS: 15 days      09/13/20, MD [facility] Pager: [email or web address] 09/14/2020, 9:45 AM

## 2020-09-14 NOTE — Progress Notes (Signed)
ANTICOAGULATION CONSULT NOTE  Pharmacy Consult for IV Heparin Indication: atrial fibrillation  No Known Allergies  Patient Measurements: Height: 5\' 11"  (180.3 cm) Weight: 113.9 kg (251 lb 1.7 oz) IBW/kg (Calculated) : 75.3 Heparin Dosing Weight: 100 kg  Vital Signs: Temp: 98.8 F (37.1 C) (01/19 0800) Temp Source: Oral (01/19 0800) BP: 133/75 (01/19 0800) Pulse Rate: 76 (01/19 0800)  Labs: Recent Labs    09/12/20 0209 09/13/20 0352 09/14/20 0404  HGB 8.0* 8.1* 7.2*  HCT 26.5* 25.2* 22.7*  PLT 358 314 273  HEPARINUNFRC  --   --  0.26*  CREATININE 1.67* 1.48* 1.70*  CKTOTAL 363 307 238    Estimated Creatinine Clearance: 57.1 mL/min (A) (by C-G formula based on SCr of 1.7 mg/dL (H)).   Medical History: Past Medical History:  Diagnosis Date  . AAA (abdominal aortic aneurysm) (HCC)   . History of open heart surgery   . Hypertension   . Seroma due to trauma [facility])     Assessment: 65 yr old male with hx Afib on apixaban 5 mg bid PTA  was admitted with bacteremia. Apixaban was on hold for possible procedures during acute illness and pt was started on IV heparin. IV heparin was briefly stopped on 1/10, with plans to transition back to apixaban, but IV heparin was resumed, as patient will need orthopedic intervention. Pt is S/P L leg debridement, fasciotomies. Heparin has been on hold for anemia post surgery.   Consulted to restart apixaban - was on 5 mg BID, will continue PTA dose given age<80, wt>60 kg despite Scr >1.5. Hgb down slightly to 7.2, plt 273.  Goal of Therapy:  Heparin level 0.3-0.7 units/ml Monitor platelets by anticoagulation protocol: Yes   Plan:  Stop heparin infusion Restart apixaban 5 mg BID  Monitor CBC, s/sx bleeding  3/10, PharmD, BCCCP Clinical Pharmacist  Phone: [phone] 09/14/2020 11:33 AM  Please check AMION for all [facility] Pharmacy phone numbers After 10:00 PM, call Main Pharmacy [phone]

## 2020-09-14 NOTE — Progress Notes (Signed)
ANTICOAGULATION CONSULT NOTE  Pharmacy Consult for Heparin Indication: atrial fibrillation  No Known Allergies  Patient Measurements: Height: 5\' 11"  (180.3 cm) Weight: 113.9 kg (251 lb 1.7 oz) IBW/kg (Calculated) : 75.3 Heparin Dosing Weight: 100 kg  Vital Signs: Temp: 99 F (37.2 C) (01/19 0353) Temp Source: Oral (01/19 0353) BP: 126/83 (01/19 0353) Pulse Rate: 77 (01/19 0353)  Labs: Recent Labs    09/12/20 0209 09/13/20 0352 09/14/20 0404  HGB 8.0* 8.1* 7.2*  HCT 26.5* 25.2* 22.7*  PLT 358 314 273  HEPARINUNFRC  --   --  0.26*  CREATININE 1.67* 1.48* 1.70*  CKTOTAL 363 307 238    Estimated Creatinine Clearance: 57.1 mL/min (A) (by C-G formula based on SCr of 1.7 mg/dL (H)).   Assessment: 65 y.o. male with h/o Afib, Eliquis on hold,for heparin Goal of Therapy:  Heparin level 0.3-0.7 units/ml Monitor platelets by anticoagulation protocol: Yes   Plan:  Increase Heparin 2400 units/hr  65, PharmD, BCPS  09/14/2020 5:45 AM

## 2020-09-14 NOTE — Discharge Instructions (Signed)

## 2020-09-14 NOTE — Progress Notes (Addendum)
Progress Note  Patient Name: Connor [name] Date of Encounter: 09/14/2020  [facility] Cardiologist: Little Ishikawa, MD   Subjective   No complaints. Maintaining sinus. On IV heparin. No plans for surgery.  Inpatient Medications    Scheduled Meds: . aspirin EC  81 mg Oral Daily  . diltiazem  240 mg Oral Daily  . feeding supplement  237 mL Oral BID BM  . gabapentin  400 mg Oral TID  . labetalol  400 mg Oral BID  . mouth rinse  15 mL Mouth Rinse BID  . oxyCODONE  5 mg Oral QID  . oxyCODONE  20 mg Oral Q12H  . sodium chloride flush  10-40 mL Intracatheter Q12H   Continuous Infusions: . sodium chloride Stopped (09/13/20 1523)  . cefTRIAXone (ROCEPHIN)  IV Stopped (09/13/20 2149)  . heparin 2,400 Units/hr (09/14/20 0559)  . methocarbamol (ROBAXIN) IV Stopped (09/11/20 2305)   PRN Meds: hydrALAZINE, HYDROmorphone (DILAUDID) injection, methocarbamol (ROBAXIN) IV, metoCLOPramide **OR** metoCLOPramide (REGLAN) injection, ondansetron **OR** ondansetron (ZOFRAN) IV, sodium chloride flush   Vital Signs    Vitals:   09/13/20 1300 09/13/20 2113 09/14/20 0353 09/14/20 0800  BP: 125/68 136/81 126/83 133/75  Pulse: 77  77 76  Resp: 17  18 18   Temp: 99.4 F (37.4 C) 99.1 F (37.3 C) 99 F (37.2 C) 98.8 F (37.1 C)  TempSrc: Oral Axillary Oral Oral  SpO2: 90% 90% 92% 93%  Weight:      Height:        Intake/Output Summary (Last 24 hours) at 09/14/2020 1042 Last data filed at 09/14/2020 0404 Gross per 24 hour  Intake 919.86 ml  Output 150 ml  Net 769.86 ml   Last 3 Weights 09/11/2020 09/09/2020 09/08/2020  Weight (lbs) 251 lb 1.7 oz 241 lb 10 oz 247 lb 9.6 oz  Weight (kg) 113.9 kg 109.6 kg 112.311 kg      Telemetry    Sinus rhythm at 74 - Personally Reviewed  ECG    N/A  Physical Exam   General appearance: alert and no distress Lungs: clear to auscultation bilaterally Heart: regular rate and rhythm Extremities: edema trace LE on right and s/p L  AKA Neurologic: Grossly normal   Labs    High Sensitivity Troponin:   Recent Labs  Lab 08/30/20 1926 08/30/20 2215 09/06/20 2147 09/07/20 0019  TROPONINIHS 425* 482* 32* 33*      Chemistry Recent Labs  Lab 09/12/20 0209 09/13/20 0352 09/14/20 0404  NA 145 141 140  K 4.4 4.4 4.4  CL 110 107 107  CO2 26 25 23   GLUCOSE 96 103* 96  BUN 20 21 24*  CREATININE 1.67* 1.48* 1.70*  CALCIUM 7.9* 7.9* 7.8*  PROT 6.5 6.7 5.9*  ALBUMIN 2.3* 2.4* 2.2*  AST 22 22 21   ALT 18 17 15   ALKPHOS 58 63 63  BILITOT 0.8 0.8 1.1  GFRNONAA 46* 53* 45*  ANIONGAP 9 9 10      Hematology Recent Labs  Lab 09/12/20 0209 09/13/20 0352 09/14/20 0404  WBC 12.7* 14.7* 14.9*  RBC 2.67* 2.59* 2.31*  HGB 8.0* 8.1* 7.2*  HCT 26.5* 25.2* 22.7*  MCV 99.3 97.3 98.3  MCH 30.0 31.3 31.2  MCHC 30.2 32.1 31.7  RDW 17.0* 17.2* 17.4*  PLT 358 314 273    BNPNo results for input(s): BNP, PROBNP in the last 168 hours.   DDimer No results for input(s): DDIMER in the last 168 hours.   Radiology    No results  found.  Cardiac Studies   TEE 09/05/20: 1. Left ventricular ejection fraction, by estimation, is 60 to 65%. The  left ventricle has normal function.  2. Right ventricular systolic function is normal. The right ventricular  size is normal. Pulmonary artery systolic pressure is 12mmHg+ RAP.  3. Left atrial size was severely dilated. No left atrial/left atrial  appendage thrombus was detected.  4. The mitral valve is normal in structure. Trivial mitral valve  regurgitation.  5. The aortic valve is tricuspid. Aortic valve regurgitation is trivial.  No aortic stenosis is present.  6. Agitated saline contrast bubble study was positive with shunting  observed after >6 cardiac cycles suggestive of intrapulmonary shunting.  7. Right atrial size was severely dilated.  8. No valvular vegetations visualized.   Patient Profile     65 y.o. male with past medical history aortic dissection  repair, previous cardiomyopathy improved, persistent atrial fibrillation, hypertension admitted with diarrhea, leg pain with fasciitis, fever.  Found to be bacteremic and also in atrial fibrillation with elevated rate.  Cardiology asked to evaluate.  Echocardiogram shows ejection fraction 60 to 65%, severe left ventricular hypertrophy, grade 2 diastolic dysfunction, biatrial enlargement. Had AKA for necrotizing fasciitis.  Assessment & Plan    Bacteremia - necrotizing fasciitis s/p left AKA - TEE 09/05/20 negative for vegetation   Persistent atrial fibrillation - continue home labetalol 400 mg BID - cardizem titrated to 240 mg daily - per ortho (secure chat)  - may restart eliquis today, I have consulted pharmacy to help with transition from heparin drip to eliquis   Hypertension Hx of aortic arch dissection s/p repair - spiro and valsartan on hold  - pressure moderately controlled - will monitor after morning medicatiosn - will need to monitor renal function closely if adding ARB or spiro  - may need to consider hydralazine   AKI - ?secondary to rhabdo - sCr 1.70 (1.48)   For questions or updates, please contact [facility] Please consult [email or web address] for contact info under   Chrystie Nose, MD, Milagros Loll  [place]  [facility]  Medical Director of the [facility]  [facility] Diplomate of the American Board of Clinical Lipidology Attending Cardiologist  Direct Dial: [phone]  Fax: [phone]  Website:  [email or web address]

## 2020-09-14 NOTE — Progress Notes (Signed)
Patient is postop day #6 status post above-knee amputation revision.  He is doing better and his inflammatory markers have trended downward.  He still continues to be anemic.   Vital signs stable afebrile laying in bed quiet but answers questions.  Wound VAC is in place with 1 green check.  There is 100 cc of light pink fluid in the canister.   Stable we will plan for removal of wound VAC in next couple days.  Will need follow-up in office 1 week after discharge either from inpatient rehab or the hospital

## 2020-09-15 DIAGNOSIS — R52 Pain, unspecified: Secondary | ICD-10-CM | POA: Diagnosis not present

## 2020-09-15 DIAGNOSIS — A409 Streptococcal sepsis, unspecified: Secondary | ICD-10-CM | POA: Diagnosis not present

## 2020-09-15 DIAGNOSIS — R6520 Severe sepsis without septic shock: Secondary | ICD-10-CM | POA: Diagnosis not present

## 2020-09-15 LAB — CK: Total CK: 202 U/L (ref 49–397)

## 2020-09-15 MED ORDER — DICLOFENAC SODIUM 1 % EX GEL
2.0000 g | Freq: Four times a day (QID) | CUTANEOUS | Status: DC
  Administered 2020-09-15 – 2020-09-19 (×15): 2 g via TOPICAL
  Filled 2020-09-15 (×2): qty 100

## 2020-09-15 MED ORDER — POLYETHYLENE GLYCOL 3350 17 G PO PACK
17.0000 g | PACK | Freq: Every day | ORAL | Status: DC
  Administered 2020-09-15 – 2020-09-19 (×5): 17 g via ORAL
  Filled 2020-09-15 (×5): qty 1

## 2020-09-15 MED ORDER — HYDROMORPHONE HCL 1 MG/ML IJ SOLN
0.5000 mg | INTRAMUSCULAR | Status: DC | PRN
  Administered 2020-09-17 – 2020-09-19 (×5): 0.5 mg via INTRAVENOUS
  Filled 2020-09-15 (×5): qty 1

## 2020-09-15 MED ORDER — OXYCODONE HCL 5 MG PO TABS
5.0000 mg | ORAL_TABLET | Freq: Four times a day (QID) | ORAL | Status: DC
  Administered 2020-09-15 – 2020-09-19 (×16): 10 mg via ORAL
  Filled 2020-09-15 (×16): qty 2

## 2020-09-15 NOTE — Progress Notes (Signed)
Occupational Therapy Treatment Patient Details Name: Connor [name] MRN: [id number] DOB: [birth year] Today's Date: 09/15/2020    History of present illness 65 yo male presenting originally for nausea/vomiting, fever, and LLE pain and swelling. Pt found to have sepsis and rhabdomyolysis stemming from LLE cellulitis/myofascitis. 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  Had revision of surgery on 1/14, now re assessed by PT.  PMH includes: AAA, CABG, and HTN.   OT comments  Pt progressing towards acute OT goals. Focus of session was simulated toilet transfer (stand-pivot with rw). Pt needed up to mod A with functional transfers this session. Cueing needed for technique with rw including positioning hand placement, repetition needed at times. Pt up in recliner at end of session. Pt needs +2 assist to safely ambulate more than pivotal steps. D/c plan remains appropriate.    Follow Up Recommendations  CIR;Supervision/Assistance - 24 hour    Equipment Recommendations  3 in 1 bedside commode;Wheelchair (measurements OT);Wheelchair cushion (measurements OT)    Recommendations for Other Services Rehab consult    Precautions / Restrictions Precautions Precautions: Fall Restrictions Weight Bearing Restrictions: Yes LLE Weight Bearing: Non weight bearing       Mobility Bed Mobility Overal bed mobility: Needs Assistance Bed Mobility: Supine to Sit     Supine to sit: Min guard;HOB elevated     General bed mobility comments: Incr time and effort but no physical assist, heavy use of rail for support.  Transfers Overall transfer level: Needs assistance Equipment used: Rolling walker (2 wheeled) Transfers: Sit to/from [employer] Sit to Stand: Mod assist;From elevated surface;Min assist Stand pivot transfers: Min assist;Min guard       General transfer comment: Mod A to powerup on initial stand from EOB. Cues for sequencing and positioning prior to  standing. Assist to stabilize rw coming to standing. Assist to control descent into recliner. Cues for hand placement with rw. Repetition needed with cueing at times.    Balance Overall balance assessment: Needs assistance Sitting-balance support: No upper extremity supported (foot supported) Sitting balance-Leahy Scale: Poor Sitting balance - Comments: close min guard-Min A for safety; posterior lean at times.   Standing balance support: During functional activity Standing balance-Leahy Scale: Poor Standing balance comment: Requires UE support and external support.                           ADL either performed or assessed with clinical judgement   ADL Overall ADL's : Needs assistance/impaired                         Toilet Transfer: Moderate assistance;Stand-pivot;BSC;RW Toilet Transfer Details (indicate cue type and reason): mod A to powerup, min A to control descent. Cues for sequencing and hand placement with rw. Repetition with verbal cues needed at times.           General ADL Comments: Pt completed bed mobility, managed urinal to void sitting EOB at min guard - min A level (posterior LOB, min A to correct).. took pivotal steps from EOB to recliner simulating toilet transfer to [facility]. Up to mod A needed.     Vision       Perception     Praxis      Cognition Arousal/Alertness: Awake/alert Behavior During Therapy: Flat affect Overall Cognitive Status: No family/caregiver present to determine baseline cognitive functioning Area of Impairment: Problem solving;Safety/judgement  Safety/Judgement: Decreased awareness of safety   Problem Solving: Slow processing General Comments: Increased time to follow one step commands at times. Difficulty sequencing? during transfers.        Exercises     Shoulder Instructions       General Comments      Pertinent Vitals/ Pain       Pain Assessment: Faces Faces Pain  Scale: Hurts even more Pain Location: LLE Pain Descriptors / Indicators: Aching;Sore Pain Intervention(s): Monitored during session;Repositioned;Limited activity within patient's tolerance  Home Living                                          Prior Functioning/Environment              Frequency  Min 2X/week        Progress Toward Goals  OT Goals(current goals can now be found in the care plan section)  Progress towards OT goals: Progressing toward goals  Acute Rehab OT Goals Patient Stated Goal: pain relief OT Goal Formulation: With patient Time For Goal Achievement: 09/22/20 Potential to Achieve Goals: Fair ADL Goals Pt Will Perform Grooming: with supervision;sitting Pt Will Perform Lower Body Bathing: with min assist;sitting/lateral leans Pt Will Perform Lower Body Dressing: with min assist;sitting/lateral leans Pt Will Transfer to Toilet: with min assist;anterior/posterior transfer;squat pivot transfer Pt/caregiver will Perform Home Exercise Program: Increased strength;Both right and left upper extremity;With theraband Additional ADL Goal #1: Pt will perform bed mobility with supervision in preparation for ADL. Additional ADL Goal #2: Pt will demonstrate fair sitting balance EOB.  Plan Discharge plan remains appropriate    Co-evaluation                 AM-PAC OT "6 Clicks" Daily Activity     Outcome Measure   Help from another person eating meals?: None Help from another person taking care of personal grooming?: A Little Help from another person toileting, which includes using toliet, bedpan, or urinal?: A Lot Help from another person bathing (including washing, rinsing, drying)?: A Lot Help from another person to put on and taking off regular upper body clothing?: A Lot Help from another person to put on and taking off regular lower body clothing?: A Lot 6 Click Score: 15    End of Session Equipment Utilized During Treatment: Gait  belt;Rolling walker  OT Visit Diagnosis: Pain;Muscle weakness (generalized) (M62.81);Other symptoms and signs involving cognitive function Pain - Right/Left: Left Pain - part of body: Leg   Activity Tolerance Patient limited by pain;Patient tolerated treatment well   Patient Left in chair;with call bell/phone within reach;with chair alarm set   Nurse Communication          Time: 1104-1130 OT Time Calculation (min): 26 min  Charges: OT General Charges $OT Visit: 1 Visit OT Treatments $Self Care/Home Management : 23-37 mins  Raynald Kemp, OT Acute Rehabilitation Services Pager: [phone] Office: [phone]    Pilar Grammes 09/15/2020, 12:19 PM

## 2020-09-15 NOTE — Progress Notes (Signed)
Physical Therapy Treatment Patient Details Name: Connor [name] MRN: [id number] DOB: 01/18/1956 Today's Date: 09/15/2020    History of Present Illness 65 yo male presenting originally for nausea/vomiting, fever, and LLE pain and swelling. Pt found to have sepsis and rhabdomyolysis stemming from LLE cellulitis/myofascitis. 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  Had revision of surgery on 1/14, now re assessed by PT.  PMH includes: AAA, CABG, and HTN.    PT Comments    Pt making progress tolerating increased session duration with PT session closely following OT session this date. Pt able to come to stand with minAx1-2 but due to L residual limb pain, weakness, and difficulty sequencing he required increased time and cues repeated to sequence preparing coming to stand and return to sit safely. Pt limited with gait distance by L limb pain and overall weakness as he fatigues with ambulating, displaying a short hop-to gait pattern, x2 bouts of ~8 ft > ~12 ft with minAx1-2 before needing to sit to rest. Focused on educating pt to increase his safety with mobility by listening to his body when he needs to rest rather than push until he can no longer stand and result in LOB, success. Will continue to follow acutely. Current recommendations remain appropriate.  Follow Up Recommendations  CIR     Equipment Recommendations  Rolling walker with 5" wheels;Wheelchair (measurements PT);Wheelchair cushion (measurements PT)    Recommendations for Other Services       Precautions / Restrictions Precautions Precautions: Fall Precaution Comments: L AKA Restrictions Weight Bearing Restrictions: Yes LLE Weight Bearing: Non weight bearing Other Position/Activity Restrictions: minimal elevation of LLE in bed    Mobility  Bed Mobility Overal bed mobility: Needs Assistance Bed Mobility: Supine to Sit     Supine to sit: Min guard;HOB elevated     General bed mobility comments: Pt  sitting up in recliner upon arrival, just completed his OT session.  Transfers Overall transfer level: Needs assistance Equipment used: Rolling walker (2 wheeled) Transfers: Sit to/from Stand Sit to Stand: Min assist;+2 physical assistance;+2 safety/equipment Stand pivot transfers: Min assist;Min guard       General transfer comment: MinAx1-2 and extra time to power up to stand, cuing for pt to scoot anteriorly and keep hands on recliner to push up to stand then transition 1 hand at a time to RW once buttocks is cleared, success but poor carryover. Cued pt to reach back for chair to control descent with return to sit, min success.  Ambulation/Gait Ambulation/Gait assistance: Min assist;+2 safety/equipment;+2 physical assistance Gait Distance (Feet): 12 Feet (x2 bouts of ~8 ft > ~12 ft, seated rest break between bouts) Assistive device: Rolling walker (2 wheeled) Gait Pattern/deviations: Step-to pattern;Decreased stride length;Trunk flexed ("hop to") Gait velocity: decr Gait velocity interpretation: <1.31 ft/sec, indicative of household ambulator General Gait Details: Slow, short hopping steps, relying heavily on hands on RW during swing phase of R leg. Cued pt to monitor himself and his fatigue and to appropriately return to sit to rest prior to complete LOB due to fatigue, with success but reminders throughout. MinAx1-2 with chair follow to maintain balance and safety.   Stairs             Wheelchair Mobility    Modified Rankin (Stroke Patients Only)       Balance Overall balance assessment: Needs assistance Sitting-balance support: No upper extremity supported (foot supported) Sitting balance-Leahy Scale: Poor Sitting balance - Comments: close min guard-Min A for safety; posterior lean  at times.   Standing balance support: Bilateral upper extremity supported;During functional activity Standing balance-Leahy Scale: Poor Standing balance comment: Requires UE support and  external support.                            Cognition Arousal/Alertness: Awake/alert Behavior During Therapy: Flat affect Overall Cognitive Status: No family/caregiver present to determine baseline cognitive functioning Area of Impairment: Problem solving;Safety/judgement                         Safety/Judgement: Decreased awareness of safety   Problem Solving: Slow processing General Comments: Increased time to follow one step commands at times. Difficulty sequencing during transfers.      Exercises      General Comments        Pertinent Vitals/Pain Pain Assessment: 0-10 Pain Score: 10-Worst pain ever Faces Pain Scale: Hurts even more Pain Location: LLE Pain Descriptors / Indicators: Aching;Sore Pain Intervention(s): Limited activity within patient's tolerance;Monitored during session;Repositioned;Premedicated before session    Home Living                      Prior Function            PT Goals (current goals can now be found in the care plan section) Acute Rehab PT Goals Patient Stated Goal: pain relief PT Goal Formulation: With patient Time For Goal Achievement: 09/22/20 Potential to Achieve Goals: Good Progress towards PT goals: Progressing toward goals    Frequency    Min 3X/week      PT Plan Current plan remains appropriate    Co-evaluation              AM-PAC PT "6 Clicks" Mobility   Outcome Measure  Help needed turning from your back to your side while in a flat bed without using bedrails?: A Little Help needed moving from lying on your back to sitting on the side of a flat bed without using bedrails?: A Little Help needed moving to and from a bed to a chair (including a wheelchair)?: A Little Help needed standing up from a chair using your arms (e.g., wheelchair or bedside chair)?: A Little Help needed to walk in hospital room?: A Little Help needed climbing 3-5 steps with a railing? : Total 6 Click Score:  16    End of Session Equipment Utilized During Treatment: Gait belt Activity Tolerance: Patient limited by lethargy Patient left: in chair;with call bell/phone within reach;with chair alarm set Nurse Communication: Mobility status;Other (comment) (pt's L residual limb dressing fell off during gait, stopped session and educated pt not to touch open skin and notified RN immediately) PT Visit Diagnosis: Unsteadiness on feet (R26.81);Other abnormalities of gait and mobility (R26.89);Muscle weakness (generalized) (M62.81);Difficulty in walking, not elsewhere classified (R26.2);Pain Pain - Right/Left: Left Pain - part of body: Leg     Time: 4540-9811 PT Time Calculation (min) (ACUTE ONLY): 16 min  Charges:  $Gait Training: 8-22 mins                     Raymond Gurney, PT, DPT Acute Rehabilitation Services  Pager: [phone] Office: [phone]    Jewel Baize 09/15/2020, 1:03 PM

## 2020-09-15 NOTE — Progress Notes (Addendum)
Patient is postop day 7 status post above-knee amputation revision on the left for necrotizing fasciitis. He is laying comfortably in his bed.  Vital signs stable still elevated white count at 14.6 no new drainage in the VAC canister. Wound VAC was removed today. Wound has well apposed wound edges no evidence of dehiscence some minimal dark bloody drainage. No ascending cellulitis. No foul odor. Will order for daily dry dressing changes

## 2020-09-15 NOTE — Progress Notes (Signed)
PROGRESS NOTE    Connor [name]   NAT:[id number]  DOB: [birth year]  DOA: 08/30/2020 PCP: Patient, No Pcp Per   Brief Narrative:  Connor [name] is a 65 y/o male with HTN, CKD 3, A-fib on Coumadin AAA, ascending aortic dissection s/p open rapair 2016 and redo on 2019, left command and external iliac stents 2020 complicated by cardiogenic shock and AKI requiring dialysis.   He presented to the ED with sepsis and rhabdomyolysis due to left leg cellulitis.  In ED > WBC 15, Lactic acid 2, CPK 28,000 and Cr of 4.2 (baseilne ~ 1.2- 1.4).  Hlater grew out strep group G in the blood. He improved overall, however, his leg did not seem to improved. On 1/12, he was ultimately taken for an AKA due to ongoing necrotizing fascitis of the leg.  His hospital course has been complicated by recurrent anemia requiring blood transfusions.    Subjective: Pain in stump is not as bad as yesterday. Having intermittent pain in right leg today in calf and thigh.  Assessment & Plan:   Principal Problem: Severe Sepsis with acute renal failure without septic shock    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 - now s/p AKA of left leg - cont Wound vac per general surgery  - continue either Ceftriaxone (while in hospital) or Amoxil (if discharged) through 1/21 - pain control remains an issue- he states he woke up in pain and it has yet to improve to a comfortable level- add Oyycontin 20 mg BID, Oxycodone > 5 mg QID routine, cont PRN IV Dilaudid- increased Nerontin from 300 mg TID to 400 mg today- follow for sedation - 1.20>  He is still using the Dilaudid quite frequently- will increase his Oxycodone from 5-10 mg with plan to wean off of Dialaudid  Active Problems:  Right leg pain - due to overuse in relation to left leg injuries/ infection - use Voltaren gel PRN with heating pad  A-fib, paroxysmal - has been on Heparin due to the procedure- transitioned back to Eliquis  - cont Cardizem and Labetalol   - Nifedipine on hold  Anemia due to acute blood loss - he has received 6 Unit of PRBC- we are closely following Hb  - he is not actively bleeding now     AKI on CKD 3a - Cr rose from 4.21 to 7.61 on 17 and subsequently has improved to 1.48 on 1/18 - cause was likely sepsis complicated by a prerenal state (vomiting prior to admission) and rhabdomyolysis - Spironolactone on hold    Nausea vomiting and diarrhea - on admission- has resolved      Morbid obesity (HCC)  Body mass index is 35.02 kg/m.        Time spent in minutes: 35 DVT prophylaxis: Eliquis Code Status: Full code Family Communication:  Disposition Plan:  Status is: Inpatient  Remains inpatient appropriate because:Inpatient level of care appropriate due to severity of illness   Dispo: The patient is from: Home              Anticipated d/c is to: CIR              Anticipated d/c date is: > 3 days              Patient currently is not medically stable to d/c.  Consultants:   Ortho  Cardiology  Nephrology  General surgery   ID Procedures:   Revision of left knee amputation Antimicrobials:  Anti-infectives (From admission, onward)  Start     Dose/Rate Route Frequency Ordered Stop   09/09/20 0600  ceFAZolin (ANCEF) IVPB 2g/100 mL premix        2 g 200 mL/hr over 30 Minutes Intravenous To Short Stay 09/09/20 0213 09/09/20 1103   09/08/20 1000  linezolid (ZYVOX) IVPB 600 mg  Status:  Discontinued        600 mg 300 mL/hr over 60 Minutes Intravenous Every 12 hours 09/08/20 0838 09/11/20 1104   09/07/20 1015  ceFAZolin (ANCEF) 3 g in dextrose 5 % 50 mL IVPB  Status:  Discontinued        3 g 100 mL/hr over 30 Minutes Intravenous On call to O.R. 09/07/20 1009 09/07/20 1043   09/07/20 0600  ceFAZolin (ANCEF) IVPB 2g/100 mL premix        2 g 200 mL/hr over 30 Minutes Intravenous On call to O.R. 09/06/20 1651 09/07/20 0855   09/02/20 2200  cefTRIAXone (ROCEPHIN) 2 g in sodium chloride 0.9 % 100 mL IVPB         2 g 200 mL/hr over 30 Minutes Intravenous Every 24 hours 09/02/20 1044 09/16/20 2359   09/01/20 2200  vancomycin (VANCOREADY) IVPB 1500 mg/300 mL  Status:  Discontinued        1,500 mg 150 mL/hr over 120 Minutes Intravenous Every 48 hours 08/31/20 0350 08/31/20 1719   09/01/20 0130  ceFEPIme (MAXIPIME) 2 g in sodium chloride 0.9 % 100 mL IVPB  Status:  Discontinued        2 g 200 mL/hr over 30 Minutes Intravenous Every 24 hours 08/31/20 1812 09/02/20 1043   09/01/20 0100  metroNIDAZOLE (FLAGYL) IVPB 500 mg  Status:  Discontinued        500 mg 100 mL/hr over 60 Minutes Intravenous Every 8 hours 08/31/20 1751 08/31/20 1926   08/31/20 2200  ceFEPIme (MAXIPIME) 2 g in sodium chloride 0.9 % 100 mL IVPB  Status:  Discontinued        2 g 200 mL/hr over 30 Minutes Intravenous Every 24 hours 08/31/20 0350 08/31/20 1719   08/31/20 2030  vancomycin (VANCOREADY) IVPB 1500 mg/300 mL        1,500 mg 150 mL/hr over 120 Minutes Intravenous  Once 08/31/20 2016 08/31/20 2338   08/31/20 2015  clindamycin (CLEOCIN) IVPB 900 mg  Status:  Discontinued        900 mg 100 mL/hr over 30 Minutes Intravenous Every 8 hours 08/31/20 1926 09/01/20 1033   08/31/20 1812  vancomycin variable dose per unstable renal function (pharmacist dosing)  Status:  Discontinued         Does not apply See admin instructions 08/31/20 1812 09/01/20 1033   08/31/20 1730  cefTRIAXone (ROCEPHIN) 2 g in sodium chloride 0.9 % 100 mL IVPB  Status:  Discontinued        2 g 200 mL/hr over 30 Minutes Intravenous Every 24 hours 08/31/20 1720 08/31/20 1751   08/31/20 1015  metroNIDAZOLE (FLAGYL) IVPB 500 mg  Status:  Discontinued        500 mg 100 mL/hr over 60 Minutes Intravenous Every 8 hours 08/31/20 1012 08/31/20 1719   08/31/20 0045  ceFEPIme (MAXIPIME) 2 g in sodium chloride 0.9 % 100 mL IVPB        2 g 200 mL/hr over 30 Minutes Intravenous  Once 08/31/20 0030 08/31/20 0208   08/31/20 0000  vancomycin (VANCOREADY) IVPB 1500 mg/300  mL        1,500 mg 150 mL/hr over  120 Minutes Intravenous  Once 08/30/20 2351 08/31/20 0208   08/30/20 1900  cefTRIAXone (ROCEPHIN) 2 g in sodium chloride 0.9 % 100 mL IVPB  Status:  Discontinued        2 g 200 mL/hr over 30 Minutes Intravenous Every 24 hours 08/30/20 1855 08/31/20 0030       Objective: Vitals:   09/14/20 1815 09/15/20 0021 09/15/20 0541 09/15/20 1100  BP: (!) 120/59 113/62 137/69 129/72  Pulse: 73 63 72 64  Resp: 20 20 20    Temp: (!) 100.6 F (38.1 C) 98.8 F (37.1 C) 98 F (36.7 C)   TempSrc: Oral Oral Oral   SpO2: 95% 95% 94%   Weight:      Height:        Intake/Output Summary (Last 24 hours) at 09/15/2020 1304 Last data filed at 09/15/2020 1200 Gross per 24 hour  Intake 840 ml  Output 1875 ml  Net -1035 ml   Filed Weights   09/08/20 0405 09/09/20 0406 09/11/20 0432  Weight: 112.3 kg 109.6 kg 113.9 kg    Examination: General exam: Appears comfortable  HEENT: PERRLA, oral mucosa moist, no sclera icterus or thrush Respiratory system: Clear to auscultation. Respiratory effort normal. Cardiovascular system: S1 & S2 heard,  No murmurs  Gastrointestinal system: Abdomen soft, non-tender, nondistended. Normal bowel sounds  Central nervous system: Alert and oriented. No focal neurological deficits. Extremities: No cyanosis, clubbing or edema- left AKA Skin: No rashes or ulcers Psychiatry:  Mood & affect appropriate.     Data Reviewed: I have personally reviewed following labs and imaging studies  CBC: Recent Labs  Lab 09/10/20 0314 09/10/20 1527 09/11/20 0302 09/12/20 0209 09/13/20 0352 09/14/20 0404  WBC 17.2*  --  14.6* 12.7* 14.7* 14.9*  NEUTROABS 14.7*  --  11.1* 8.9* 11.5* 11.3*  HGB 6.7* 7.5* 7.3* 8.0* 8.1* 7.2*  HCT 21.1* 24.1* 22.5* 26.5* 25.2* 22.7*  MCV 94.2  --  96.2 99.3 97.3 98.3  PLT 392  --  268 358 314 273   Basic Metabolic Panel: Recent Labs  Lab 09/10/20 0314 09/11/20 0302 09/12/20 0209 09/13/20 0352 09/14/20 0404   NA 144 145 145 141 140  K 4.6 3.9 4.4 4.4 4.4  CL 109 110 110 107 107  CO2 26 25 26 25 23   GLUCOSE 164* 120* 96 103* 96  BUN 28* 24* 20 21 24*  CREATININE 2.04* 1.90* 1.67* 1.48* 1.70*  CALCIUM 7.7* 7.3* 7.9* 7.9* 7.8*  MG 1.6* 1.4* 1.6* 1.7 1.8  PHOS 3.1 3.4 3.3 3.5 3.7   GFR: Estimated Creatinine Clearance: 57.1 mL/min (A) (by C-G formula based on SCr of 1.7 mg/dL (H)). Liver Function Tests: Recent Labs  Lab 09/10/20 0314 09/11/20 0302 09/12/20 0209 09/13/20 0352 09/14/20 0404  AST 24 22 22 22 21   ALT 17 16 18 17 15   ALKPHOS 60 53 58 63 63  BILITOT 0.8 0.7 0.8 0.8 1.1  PROT 5.9* 6.1* 6.5 6.7 5.9*  ALBUMIN 2.0* 2.1* 2.3* 2.4* 2.2*   No results for input(s): LIPASE, AMYLASE in the last 168 hours. No results for input(s): AMMONIA in the last 168 hours. Coagulation Profile: No results for input(s): INR, PROTIME in the last 168 hours. Cardiac Enzymes: Recent Labs  Lab 09/11/20 0302 09/12/20 0209 09/13/20 0352 09/14/20 0404 09/15/20 0258  CKTOTAL 390 363 307 238 202   BNP (last 3 results) No results for input(s): PROBNP in the last 8760 hours. HbA1C: No results for input(s): HGBA1C in the last 72  hours. CBG: Recent Labs  Lab 09/09/20 1120  GLUCAP 112*   Lipid Profile: No results for input(s): CHOL, HDL, LDLCALC, TRIG, CHOLHDL, LDLDIRECT in the last 72 hours. Thyroid Function Tests: No results for input(s): TSH, T4TOTAL, FREET4, T3FREE, THYROIDAB in the last 72 hours. Anemia Panel: No results for input(s): VITAMINB12, FOLATE, FERRITIN, TIBC, IRON, RETICCTPCT in the last 72 hours. Urine analysis:    Component Value Date/Time   COLORURINE AMBER (A) 08/31/2020 0130   APPEARANCEUR CLOUDY (A) 08/31/2020 0130   LABSPEC 1.017 08/31/2020 0130   PHURINE 5.0 08/31/2020 0130   GLUCOSEU NEGATIVE 08/31/2020 0130   HGBUR LARGE (A) 08/31/2020 0130   BILIRUBINUR NEGATIVE 08/31/2020 0130   KETONESUR NEGATIVE 08/31/2020 0130   PROTEINUR 100 (A) 08/31/2020 0130    NITRITE NEGATIVE 08/31/2020 0130   LEUKOCYTESUR NEGATIVE 08/31/2020 0130   Sepsis Labs: @LABRCNTIP (procalcitonin:4,lacticidven:4) ) Recent Results (from the past 240 hour(s))  Aerobic/Anaerobic Culture (surgical/deep wound)     Status: None   Collection Time: 09/07/20  9:02 AM   Specimen: Leg, Left; Tissue  Result Value Ref Range Status   Specimen Description TISSUE  Final   Special Requests LEFT THIGH FACIA SPEC A  Final   Gram Stain NO WBC SEEN NO ORGANISMS SEEN   Final   Culture   Final    No growth aerobically or anaerobically. Performed at [facility], [address], [place], [place]    Report Status 09/12/2020 FINAL  Final  Aerobic/Anaerobic Culture (surgical/deep wound)     Status: None   Collection Time: 09/07/20  9:05 AM   Specimen: Leg, Left; Tissue  Result Value Ref Range Status   Specimen Description TISSUE  Final   Special Requests LEFT CALF MUSCLE SPEC B  Final   Gram Stain   Final    RARE WBC PRESENT, PREDOMINANTLY MONONUCLEAR NO ORGANISMS SEEN    Culture   Final    No growth aerobically or anaerobically. Performed at [facility], [address] [place], [place]    Report Status 09/12/2020 FINAL  Final  Surgical pcr screen     Status: None   Collection Time: 09/09/20  4:22 AM   Specimen: Nasal Mucosa; Nasal Swab  Result Value Ref Range Status   MRSA, PCR NEGATIVE NEGATIVE Final   Staphylococcus aureus NEGATIVE NEGATIVE Final    Comment: (NOTE) The Xpert SA Assay (FDA approved for NASAL specimens in patients 62 years of age and older), is one component of a comprehensive surveillance program. It is not intended to diagnose infection nor to guide or monitor treatment. Performed at [facility], [address] [place], [place]          Radiology Studies: No results found.    Scheduled Meds: . apixaban  5 mg Oral BID  . aspirin EC  81 mg Oral Daily  . diltiazem  240 mg Oral Daily  .  feeding supplement  237 mL Oral BID BM  . gabapentin  400 mg Oral TID  . labetalol  400 mg Oral BID  . mouth rinse  15 mL Mouth Rinse BID  . oxyCODONE  5 mg Oral QID  . oxyCODONE  20 mg Oral Q12H  . sodium chloride flush  10-40 mL Intracatheter Q12H   Continuous Infusions: . cefTRIAXone (ROCEPHIN)  IV 2 g (09/14/20 2228)     LOS: 16 days      09/16/20, MD [facility] Pager: [email or web address] 09/15/2020, 1:04 PM

## 2020-09-15 NOTE — Progress Notes (Signed)
   Switched to Eliquis yesterday - monitor hemoglobin. Rhythm stable. Short run of NSVT. No further suggestions at this time.  [facility] will sign off.   Medication Recommendations:  Continue current meds Other recommendations (labs, testing, etc):  none Follow up as an outpatient:  Dr. Daryel November, MD, [facility], FACP  [place]  [facility]  Medical Director of the [facility]  [facility] Diplomate of the American Board of Clinical Lipidology Attending Cardiologist  Direct Dial: [phone]  Fax: [phone]  Website:  [email or web address]

## 2020-09-15 NOTE — Progress Notes (Signed)
Inpatient Rehab Admissions Coordinator:   I  Met with Pt. At bedside for ongoing discussion regarding potential CIR admit. Pt. Is amenable to coming to rehab now. Will pursue for potential admit pending bed availability.  Clemens Catholic, [place], [place] Admissions Coordinator  [phone] ([place]) [phone] (office)

## 2020-09-16 DIAGNOSIS — R778 Other specified abnormalities of plasma proteins: Secondary | ICD-10-CM | POA: Diagnosis not present

## 2020-09-16 DIAGNOSIS — A409 Streptococcal sepsis, unspecified: Secondary | ICD-10-CM | POA: Diagnosis not present

## 2020-09-16 DIAGNOSIS — I480 Paroxysmal atrial fibrillation: Secondary | ICD-10-CM | POA: Diagnosis not present

## 2020-09-16 DIAGNOSIS — R6520 Severe sepsis without septic shock: Secondary | ICD-10-CM | POA: Diagnosis not present

## 2020-09-16 LAB — CBC
HCT: 20.6 % — ABNORMAL LOW (ref 39.0–52.0)
HCT: 19.6 % — ABNORMAL LOW (ref 39.0–52.0)
Hemoglobin: 6.2 g/dL — CL (ref 13.0–17.0)
Hemoglobin: 6.1 g/dL — CL (ref 13.0–17.0)
MCH: 30.1 pg (ref 26.0–34.0)
MCH: 30.8 pg (ref 26.0–34.0)
MCHC: 30.1 g/dL (ref 30.0–36.0)
MCHC: 31.1 g/dL (ref 30.0–36.0)
MCV: 100 fL (ref 80.0–100.0)
MCV: 99 fL (ref 80.0–100.0)
Platelets: 248 10*3/uL (ref 150–400)
Platelets: 225 10*3/uL (ref 150–400)
RBC: 2.06 MIL/uL — ABNORMAL LOW (ref 4.22–5.81)
RBC: 1.98 MIL/uL — ABNORMAL LOW (ref 4.22–5.81)
RDW: 16.2 % — ABNORMAL HIGH (ref 11.5–15.5)
RDW: 16.3 % — ABNORMAL HIGH (ref 11.5–15.5)
WBC: 8.1 10*3/uL (ref 4.0–10.5)
WBC: 8.3 10*3/uL (ref 4.0–10.5)
nRBC: 0.4 % — ABNORMAL HIGH (ref 0.0–0.2)
nRBC: 0.2 % (ref 0.0–0.2)

## 2020-09-16 LAB — COMPREHENSIVE METABOLIC PANEL
ALT: 25 U/L (ref 0–44)
AST: 35 U/L (ref 15–41)
Albumin: 2 g/dL — ABNORMAL LOW (ref 3.5–5.0)
Alkaline Phosphatase: 124 U/L (ref 38–126)
Anion gap: 8 (ref 5–15)
BUN: 24 mg/dL — ABNORMAL HIGH (ref 8–23)
CO2: 27 mmol/L (ref 22–32)
Calcium: 8.1 mg/dL — ABNORMAL LOW (ref 8.9–10.3)
Chloride: 106 mmol/L (ref 98–111)
Creatinine, Ser: 1.61 mg/dL — ABNORMAL HIGH (ref 0.61–1.24)
GFR, Estimated: 48 mL/min — ABNORMAL LOW (ref >60)
Glucose, Bld: 129 mg/dL — ABNORMAL HIGH (ref 70–99)
Potassium: 4.9 mmol/L (ref 3.5–5.1)
Sodium: 141 mmol/L (ref 135–145)
Total Bilirubin: 1 mg/dL (ref 0.3–1.2)
Total Protein: 6.4 g/dL — ABNORMAL LOW (ref 6.5–8.1)

## 2020-09-16 LAB — LACTATE DEHYDROGENASE: LDH: 212 U/L — ABNORMAL HIGH (ref 98–192)

## 2020-09-16 MED ORDER — SODIUM CHLORIDE 0.9% IV SOLUTION: Freq: Once | INTRAVENOUS | Status: AC

## 2020-09-16 NOTE — Progress Notes (Signed)
Inpatient Rehab Admissions Coordinator:   I met with Pt. To notify him that I do not have a bed for him on CIR today. Will continue to follow for potential admission once a bed becomes available.  Clemens Catholic, [place], [place] Admissions Coordinator  [phone] ([place]) [phone] (office)

## 2020-09-16 NOTE — Progress Notes (Signed)
PROGRESS NOTE    Connor SequinRobert Cadena   NWG:[id number]  DOB: [birth year]  DOA: 08/30/2020 PCP: Patient, No Pcp Per   Brief Narrative:  Connor [name] is a 65 y/o male with HTN, CKD 3, A-fib on Coumadin AAA, ascending aortic dissection s/p open rapair 2016 and redo on 2019, left command and external iliac stents 2020 complicated by cardiogenic shock and AKI requiring dialysis.   He presented to the ED with sepsis and rhabdomyolysis due to left leg cellulitis.  In ED > WBC 15, Lactic acid 2, CPK 28,000 and Cr of 4.2 (baseilne ~ 1.2- 1.4).  Hlater grew out strep group G in the blood. He improved overall, however, his leg did not seem to improved. On 1/12, he was ultimately taken for an AKA due to ongoing necrotizing fascitis of the leg.  His hospital course has been complicated by recurrent anemia requiring blood transfusions.    Subjective: Continues to have reasonable pain control. He has no new complaints.   Assessment & Plan:   Principal Problem: Severe Sepsis with acute renal failure without septic shock    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 - now s/p AKA of left leg - cont Wound vac per general surgery  - continue either Ceftriaxone (while in hospital) or Amoxil (if discharged) through 1/21 (today) - pain control remains an issue- he states he woke up in pain and it has yet to improve to a comfortable level- add Oyycontin 20 mg BID, Oxycodone > 5 mg QID routine, cont PRN IV Dilaudid- increased Nerontin from 300 mg TID to 400 mg today- follow for sedation - I have increased his narcotics to a point where he is using little to no IV Dilaudid. He is functional at current doses without exhibiting signs of being oversedated or confused.   Active Problems:  Right leg pain - due to overuse in relation to left leg injuries/ infection - use Voltaren gel PRN with heating pad  A-fib, paroxysmal - has been on Heparin due to the procedure- transitioned back to Eliquis  - cont  Cardizem and Labetalol  - Nifedipine on hold  Anemia due to acute blood loss - he has received 6 Unit of PRBC- we are closely following Hb  - he is not actively bleeding now     AKI on CKD 3a - Cr rose from 4.21 to 7.61 on 17 and subsequently has improved to 1.48 on 1/18 - cause was likely sepsis complicated by a prerenal state (vomiting prior to admission) and rhabdomyolysis  Grade 2 diastolic CHF - compensated - Spironolactone on hold (BP has been low at times) - following volume status    Nausea vomiting and diarrhea - on admission- has resolved      Morbid obesity (HCC)  Body mass index is 35.02 kg/m.      Time spent in minutes: 35 DVT prophylaxis: Eliquis Code Status: Full code Family Communication:  Disposition Plan:  Status is: Inpatient  Remains inpatient appropriate because:Inpatient level of care appropriate due to severity of illness   Dispo: The patient is from: Home              Anticipated d/c is to: CIR              Anticipated d/c date is: > 3 days              Patient currently is not medically stable to d/c.  Consultants:   Ortho  Cardiology  Nephrology  General surgery  ID Procedures:   Revision of left knee amputation Antimicrobials:  Anti-infectives (From admission, onward)   Start     Dose/Rate Route Frequency Ordered Stop   09/09/20 0600  ceFAZolin (ANCEF) IVPB 2g/100 mL premix        2 g 200 mL/hr over 30 Minutes Intravenous To Short Stay 09/09/20 0213 09/09/20 1103   09/08/20 1000  linezolid (ZYVOX) IVPB 600 mg  Status:  Discontinued        600 mg 300 mL/hr over 60 Minutes Intravenous Every 12 hours 09/08/20 0838 09/11/20 1104   09/07/20 1015  ceFAZolin (ANCEF) 3 g in dextrose 5 % 50 mL IVPB  Status:  Discontinued        3 g 100 mL/hr over 30 Minutes Intravenous On call to O.R. 09/07/20 1009 09/07/20 1043   09/07/20 0600  ceFAZolin (ANCEF) IVPB 2g/100 mL premix        2 g 200 mL/hr over 30 Minutes Intravenous On call to  O.R. 09/06/20 1651 09/07/20 0855   09/02/20 2200  cefTRIAXone (ROCEPHIN) 2 g in sodium chloride 0.9 % 100 mL IVPB        2 g 200 mL/hr over 30 Minutes Intravenous Every 24 hours 09/02/20 1044 09/16/20 2359   09/01/20 2200  vancomycin (VANCOREADY) IVPB 1500 mg/300 mL  Status:  Discontinued        1,500 mg 150 mL/hr over 120 Minutes Intravenous Every 48 hours 08/31/20 0350 08/31/20 1719   09/01/20 0130  ceFEPIme (MAXIPIME) 2 g in sodium chloride 0.9 % 100 mL IVPB  Status:  Discontinued        2 g 200 mL/hr over 30 Minutes Intravenous Every 24 hours 08/31/20 1812 09/02/20 1043   09/01/20 0100  metroNIDAZOLE (FLAGYL) IVPB 500 mg  Status:  Discontinued        500 mg 100 mL/hr over 60 Minutes Intravenous Every 8 hours 08/31/20 1751 08/31/20 1926   08/31/20 2200  ceFEPIme (MAXIPIME) 2 g in sodium chloride 0.9 % 100 mL IVPB  Status:  Discontinued        2 g 200 mL/hr over 30 Minutes Intravenous Every 24 hours 08/31/20 0350 08/31/20 1719   08/31/20 2030  vancomycin (VANCOREADY) IVPB 1500 mg/300 mL        1,500 mg 150 mL/hr over 120 Minutes Intravenous  Once 08/31/20 2016 08/31/20 2338   08/31/20 2015  clindamycin (CLEOCIN) IVPB 900 mg  Status:  Discontinued        900 mg 100 mL/hr over 30 Minutes Intravenous Every 8 hours 08/31/20 1926 09/01/20 1033   08/31/20 1812  vancomycin variable dose per unstable renal function (pharmacist dosing)  Status:  Discontinued         Does not apply See admin instructions 08/31/20 1812 09/01/20 1033   08/31/20 1730  cefTRIAXone (ROCEPHIN) 2 g in sodium chloride 0.9 % 100 mL IVPB  Status:  Discontinued        2 g 200 mL/hr over 30 Minutes Intravenous Every 24 hours 08/31/20 1720 08/31/20 1751   08/31/20 1015  metroNIDAZOLE (FLAGYL) IVPB 500 mg  Status:  Discontinued        500 mg 100 mL/hr over 60 Minutes Intravenous Every 8 hours 08/31/20 1012 08/31/20 1719   08/31/20 0045  ceFEPIme (MAXIPIME) 2 g in sodium chloride 0.9 % 100 mL IVPB        2 g 200 mL/hr over  30 Minutes Intravenous  Once 08/31/20 0030 08/31/20 0208   08/31/20 0000  vancomycin (  VANCOREADY) IVPB 1500 mg/300 mL        1,500 mg 150 mL/hr over 120 Minutes Intravenous  Once 08/30/20 2351 08/31/20 0208   08/30/20 1900  cefTRIAXone (ROCEPHIN) 2 g in sodium chloride 0.9 % 100 mL IVPB  Status:  Discontinued        2 g 200 mL/hr over 30 Minutes Intravenous Every 24 hours 08/30/20 1855 08/31/20 0030       Objective: Vitals:   09/15/20 1758 09/16/20 0031 09/16/20 0510 09/16/20 0901  BP: 122/73 (!) 105/57 109/63 132/86  Pulse: 69 (!) 59 62 96  Resp: 18 18 18    Temp: 97.9 F (36.6 C) 98.9 F (37.2 C) 99.1 F (37.3 C)   TempSrc: Oral Oral Oral   SpO2: 97% 99% 99%   Weight:      Height:        Intake/Output Summary (Last 24 hours) at 09/16/2020 1112 Last data filed at 09/15/2020 2234 Gross per 24 hour  Intake 600 ml  Output 1200 ml  Net -600 ml   Filed Weights   09/08/20 0405 09/09/20 0406 09/11/20 0432  Weight: 112.3 kg 109.6 kg 113.9 kg    Examination: General exam: Appears comfortable  HEENT: PERRLA, oral mucosa moist, no sclera icterus or thrush Respiratory system: Clear to auscultation. Respiratory effort normal. Cardiovascular system: S1 & S2 heard,  No murmurs  Gastrointestinal system: Abdomen soft, non-tender, nondistended. Normal bowel sounds   Central nervous system: Alert and oriented. No focal neurological deficits. Extremities: No cyanosis, clubbing or edema- left AKA Skin: No rashes or ulcers Psychiatry:  Mood & affect appropriate.    Data Reviewed: I have personally reviewed following labs and imaging studies  CBC: Recent Labs  Lab 09/10/20 0314 09/10/20 1527 09/11/20 0302 09/12/20 0209 09/13/20 0352 09/14/20 0404  WBC 17.2*  --  14.6* 12.7* 14.7* 14.9*  NEUTROABS 14.7*  --  11.1* 8.9* 11.5* 11.3*  HGB 6.7* 7.5* 7.3* 8.0* 8.1* 7.2*  HCT 21.1* 24.1* 22.5* 26.5* 25.2* 22.7*  MCV 94.2  --  96.2 99.3 97.3 98.3  PLT 392  --  268 358 314 273    Basic Metabolic Panel: Recent Labs  Lab 09/10/20 0314 09/11/20 0302 09/12/20 0209 09/13/20 0352 09/14/20 0404  NA 144 145 145 141 140  K 4.6 3.9 4.4 4.4 4.4  CL 109 110 110 107 107  CO2 26 25 26 25 23   GLUCOSE 164* 120* 96 103* 96  BUN 28* 24* 20 21 24*  CREATININE 2.04* 1.90* 1.67* 1.48* 1.70*  CALCIUM 7.7* 7.3* 7.9* 7.9* 7.8*  MG 1.6* 1.4* 1.6* 1.7 1.8  PHOS 3.1 3.4 3.3 3.5 3.7   GFR: Estimated Creatinine Clearance: 57.1 mL/min (A) (by C-G formula based on SCr of 1.7 mg/dL (H)). Liver Function Tests: Recent Labs  Lab 09/10/20 0314 09/11/20 0302 09/12/20 0209 09/13/20 0352 09/14/20 0404  AST 24 22 22 22 21   ALT 17 16 18 17 15   ALKPHOS 60 53 58 63 63  BILITOT 0.8 0.7 0.8 0.8 1.1  PROT 5.9* 6.1* 6.5 6.7 5.9*  ALBUMIN 2.0* 2.1* 2.3* 2.4* 2.2*   No results for input(s): LIPASE, AMYLASE in the last 168 hours. No results for input(s): AMMONIA in the last 168 hours. Coagulation Profile: No results for input(s): INR, PROTIME in the last 168 hours. Cardiac Enzymes: Recent Labs  Lab 09/11/20 0302 09/12/20 0209 09/13/20 0352 09/14/20 0404 09/15/20 0258  CKTOTAL 390 363 307 238 202   BNP (last 3 results) No results for  input(s): PROBNP in the last 8760 hours. HbA1C: No results for input(s): HGBA1C in the last 72 hours. CBG: Recent Labs  Lab 09/09/20 1120  GLUCAP 112*   Lipid Profile: No results for input(s): CHOL, HDL, LDLCALC, TRIG, CHOLHDL, LDLDIRECT in the last 72 hours. Thyroid Function Tests: No results for input(s): TSH, T4TOTAL, FREET4, T3FREE, THYROIDAB in the last 72 hours. Anemia Panel: No results for input(s): VITAMINB12, FOLATE, FERRITIN, TIBC, IRON, RETICCTPCT in the last 72 hours. Urine analysis:    Component Value Date/Time   COLORURINE AMBER (A) 08/31/2020 0130   APPEARANCEUR CLOUDY (A) 08/31/2020 0130   LABSPEC 1.017 08/31/2020 0130   PHURINE 5.0 08/31/2020 0130   GLUCOSEU NEGATIVE 08/31/2020 0130   HGBUR LARGE (A) 08/31/2020 0130    BILIRUBINUR NEGATIVE 08/31/2020 0130   KETONESUR NEGATIVE 08/31/2020 0130   PROTEINUR 100 (A) 08/31/2020 0130   NITRITE NEGATIVE 08/31/2020 0130   LEUKOCYTESUR NEGATIVE 08/31/2020 0130   Sepsis Labs: @LABRCNTIP (procalcitonin:4,lacticidven:4) ) Recent Results (from the past 240 hour(s))  Aerobic/Anaerobic Culture (surgical/deep wound)     Status: None   Collection Time: 09/07/20  9:02 AM   Specimen: Leg, Left; Tissue  Result Value Ref Range Status   Specimen Description TISSUE  Final   Special Requests LEFT THIGH FACIA SPEC A  Final   Gram Stain NO WBC SEEN NO ORGANISMS SEEN   Final   Culture   Final    No growth aerobically or anaerobically. Performed at [facility], [address] [place], [place] [address]    Report Status 09/12/2020 FINAL  Final  Aerobic/Anaerobic Culture (surgical/deep wound)     Status: None   Collection Time: 09/07/20  9:05 AM   Specimen: Leg, Left; Tissue  Result Value Ref Range Status   Specimen Description TISSUE  Final   Special Requests LEFT CALF MUSCLE SPEC B  Final   Gram Stain   Final    RARE WBC PRESENT, PREDOMINANTLY MONONUCLEAR NO ORGANISMS SEEN    Culture   Final    No growth aerobically or anaerobically. Performed at [facility], [address] [place], [place] [address]    Report Status 09/12/2020 FINAL  Final  Surgical pcr screen     Status: None   Collection Time: 09/09/20  4:22 AM   Specimen: Nasal Mucosa; Nasal Swab  Result Value Ref Range Status   MRSA, PCR NEGATIVE NEGATIVE Final   Staphylococcus aureus NEGATIVE NEGATIVE Final    Comment: (NOTE) The Xpert SA Assay (FDA approved for NASAL specimens in patients 64 years of age and older), is one component of a comprehensive surveillance program. It is not intended to diagnose infection nor to guide or monitor treatment. Performed at [facility], [address] [place], [place] [address]          Radiology Studies: No results  found.    Scheduled Meds: . apixaban  5 mg Oral BID  . aspirin EC  81 mg Oral Daily  . diclofenac Sodium  2 g Topical QID  . diltiazem  240 mg Oral Daily  . feeding supplement  237 mL Oral BID BM  . gabapentin  400 mg Oral TID  . labetalol  400 mg Oral BID  . mouth rinse  15 mL Mouth Rinse BID  . oxyCODONE  5-10 mg Oral QID  . oxyCODONE  20 mg Oral Q12H  . polyethylene glycol  17 g Oral Daily  . sodium chloride flush  10-40 mL Intracatheter Q12H  Continuous Infusions: . cefTRIAXone (ROCEPHIN)  IV 2 g (09/15/20 2328)     LOS: 17 days      Calvert Cantor, MD [facility] Pager: [email or web address] 09/16/2020, 11:12 AM

## 2020-09-16 NOTE — Progress Notes (Signed)
CRITICAL VALUE ALERT  Critical Value:  Hemoglobin 6.2  Date & Time Notied:  1/21 1218  Provider Notified: Yes  Orders Received/Actions taken: Awaiting orders

## 2020-09-16 NOTE — PMR Pre-admission (Signed)
PMR Admission Coordinator Pre-Admission Assessment  Patient: Connor [name] is an 65 y.o., male MRN: [id number] DOB: [birth year] Height: 5' 11"  (180.3 cm) Weight: 113.9 kg  Insurance Information HMO:     PPO:   PCP:      IPA:      80/20:      OTHER:  PRIMARY: Medicaid [place]     Policy#: [id number]      Subscriber: Pt CM Name:       Phone#:      Fax#:  Pre-Cert#:      Employer:  Benefits:  Phone #:      Name:  Eff. Date: Effective 09/16/2020, verified via passport onesource     Deduct:       Out of Pocket Max:       Life Max:  CIR:   Covered per Medicaid Guidelines    SNF:  Outpatient:      Co-Pay:  Home Health:       Co-Pay:  DME:      Co-Pay:  Providers:  SECONDARY:       Policy#:      Phone#:   Development worker, community:       Phone#:   The "Data Collection Information Summary" for patients in Inpatient Rehabilitation Facilities with attached "Privacy Act [place] Records" was provided and verbally reviewed with: NA  Emergency Contact Information Contact Information    Name Relation Home Work Mobile   Mortell,NICOLE Niece   [phone]      Current Medical History  Patient Admitting Diagnosis: L AKA  History of Present Illness: 65 yo male with PMH o: AAA, CABG, and HTN.presented to the ED nausea/vomiting, fever, and LLE pain and swelling. Pt found to have sepsis and rhabdomyolysis stemming from LLE cellulitis/myofascitis. 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  Had revision of surgery on 1/14 due to overuse in relation to left leg injuries/ infection. Post op, pt. developed anemia due to acute blood loss and has received 6 units of PRBC, most recently on 09/16/2020. On 1/23, Eliquis stopped and CT of leg obtained and it showed a hematoma, most likely subacute. Hematology was consulted due to anemia; His severe normocytic amemia was felt to be multifactorial; currently feel his bone marrow is starting to recover and his hemoglobin has stabilized with  recommendation for continued monitoring. Pt. Also with AKI on CKD 3a.  Cr rose from 4.21 to 7.61 on 17 and subsequently has improved to 1.48 on 1/18. Cause was likely sepsis complicated by a prerenal state (vomiting prior to admission) and rhabdomyolysis.  Spironolactone on hold. CIR was consulted to assist pt in return to PLOF. Pt is to admit to CIR on 09/19/20.  Patient's medical record from [facility] has been reviewed by the rehabilitation admission coordinator and physician.  Past Medical History  Past Medical History:  Diagnosis Date  . AAA (abdominal aortic aneurysm) ([place])   . History of open heart surgery   . Hypertension   . Seroma due to trauma [facility])     Family History   family history includes CAD in his mother; CVA in his father.  Prior Rehab/Hospitalizations Has the patient had prior rehab or hospitalizations prior to admission? No  Has the patient had major surgery during 100 days prior to admission? Yes   Current Medications  Current Facility-Administered Medications:  .  apixaban (ELIQUIS) tablet 5 mg, 5 mg, Oral, BID, Rizwan, Saima, MD, 5 mg at 09/15/20 2228 .  aspirin EC  tablet 81 mg, 81 mg, Oral, Daily, Persons, Bevely Palmer, [place], 81 mg at 09/15/20 0848 .  cefTRIAXone (ROCEPHIN) 2 g in sodium chloride 0.9 % 100 mL IVPB, 2 g, Intravenous, Q24H, Pham, Minh Q, RPH-CPP, Last Rate: 200 mL/hr at 09/15/20 2328, 2 g at 09/15/20 2328 .  diclofenac Sodium (VOLTAREN) 1 % topical gel 2 g, 2 g, Topical, QID, Rizwan, Saima, MD, 2 g at 09/15/20 2228 .  diltiazem (CARDIZEM CD) 24 hr capsule 240 mg, 240 mg, Oral, Daily, Barrett, Rhonda G, PA-C, 240 mg at 09/15/20 0847 .  feeding supplement (ENSURE ENLIVE / ENSURE PLUS) liquid 237 mL, 237 mL, Oral, BID BM, Persons, Bevely Palmer, PA, 237 mL at 09/15/20 1335 .  gabapentin (NEURONTIN) capsule 400 mg, 400 mg, Oral, TID, Rizwan, Saima, MD, 400 mg at 09/15/20 2228 .  hydrALAZINE (APRESOLINE) injection 10 mg, 10 mg, Intravenous,  Q6H PRN, Persons, Bevely Palmer, PA, 10 mg at 09/09/20 1250 .  HYDROmorphone (DILAUDID) injection 0.5 mg, 0.5 mg, Intravenous, Q2H PRN, Rizwan, Saima, MD .  labetalol (NORMODYNE) tablet 400 mg, 400 mg, Oral, BID, Barrett, Rhonda G, PA-C, 400 mg at 09/15/20 2228 .  MEDLINE mouth rinse, 15 mL, Mouth Rinse, BID, Persons, Bevely Palmer, [place], 15 mL at 09/15/20 2228 .  ondansetron (ZOFRAN) tablet 4 mg, 4 mg, Oral, Q6H PRN **OR** ondansetron (ZOFRAN) injection 4 mg, 4 mg, Intravenous, Q6H PRN, Persons, Bevely Palmer, PA .  oxyCODONE (Oxy IR/ROXICODONE) immediate release tablet 5-10 mg, 5-10 mg, Oral, QID, Rizwan, Saima, MD, 10 mg at 09/15/20 2315 .  oxyCODONE (OXYCONTIN) 12 hr tablet 20 mg, 20 mg, Oral, Q12H, Rizwan, Saima, MD, 20 mg at 09/15/20 2208 .  polyethylene glycol (MIRALAX / GLYCOLAX) packet 17 g, 17 g, Oral, Daily, Rizwan, Saima, MD, 17 g at 09/15/20 1731 .  sodium chloride flush (NS) 0.9 % injection 10-40 mL, 10-40 mL, Intracatheter, Q12H, Persons, Bevely Palmer, PA, 10 mL at 09/15/20 2230 .  sodium chloride flush (NS) 0.9 % injection 10-40 mL, 10-40 mL, Intracatheter, PRN, Persons, Bevely Palmer, PA  Patients Current Diet:  Diet Order            Diet Carb Modified Fluid consistency: Thin; Room service appropriate? Yes  Diet effective now                 Precautions / Restrictions Precautions Precautions: Fall Precaution Comments: L AKA Restrictions Weight Bearing Restrictions: Yes LLE Weight Bearing: Non weight bearing Other Position/Activity Restrictions: minimal elevation of LLE in bed   Has the patient had 2 or more falls or a fall with injury in the past year? No  Prior Activity Level Community (5-7x/wk): Active in the community PTA  Prior Functional Level Self Care: Did the patient need help bathing, dressing, using the toilet or eating? Independent  Indoor Mobility: Did the patient need assistance with walking from room to room (with or without device)? Independent  Stairs: Did the  patient need assistance with internal or external stairs (with or without device)? Independent  Functional Cognition: Did the patient need help planning regular tasks such as shopping or remembering to take medications? [place] / Equipment Home Assistive Devices/Equipment: Cane (specify quad or straight) (uses his uncle's) Home Equipment: Cane - single point  Prior Device Use: Indicate devices/aids used by the patient prior to current illness, exacerbation or injury? None of the above  Current Functional Level Cognition  Overall Cognitive Status: No family/caregiver present to determine baseline cognitive functioning Orientation Level:  Oriented X4 Safety/Judgement: Decreased awareness of safety General Comments: Increased time to follow one step commands at times. Difficulty sequencing during transfers.    Extremity Assessment (includes Sensation/Coordination)  Upper Extremity Assessment: Generalized weakness  Lower Extremity Assessment: Defer to PT evaluation LLE Deficits / Details: L LE AKA with wound vac LLE: Unable to fully assess due to pain LLE Sensation: WNL LLE Coordination: WNL    ADLs  Overall ADL's : Needs assistance/impaired Eating/Feeding: Set up,Sitting Grooming: Set up,Bed level,Wash/dry hands,Wash/dry face Upper Body Bathing: Maximal assistance,Bed level Lower Body Bathing: Total assistance,Bed level Upper Body Dressing : Moderate assistance,Bed level Lower Body Dressing: Total assistance,Bed level Toilet Transfer: Moderate assistance,Stand-pivot,BSC,RW Toilet Transfer Details (indicate cue type and reason): mod A to powerup, min A to control descent. Cues for sequencing and hand placement with rw. Repetition with verbal cues needed at times. Toileting- Clothing Manipulation and Hygiene: Total assistance,Bed level Toileting - Clothing Manipulation Details (indicate cue type and reason): Pt unable to properly clean self Functional  mobility during ADLs: Minimal assistance,Cueing for safety,Cueing for sequencing,Rolling walker General ADL Comments: Pt completed bed mobility, managed urinal to void sitting EOB at min guard - min A level (posterior LOB, min A to correct).. took pivotal steps from EOB to recliner simulating toilet transfer to [facility]. Up to mod A needed.    Mobility  Overal bed mobility: Needs Assistance Bed Mobility: Supine to Sit Rolling: Supervision Sidelying to sit: Min assist Supine to sit: Min guard,HOB elevated Sit to supine: Supervision,HOB elevated Sit to sidelying: Min assist General bed mobility comments: Pt sitting up in recliner upon arrival, just completed his OT session.    Transfers  Overall transfer level: Needs assistance Equipment used: Rolling walker (2 wheeled) Transfers: Sit to/from Stand Sit to Stand: Min assist,+2 physical assistance,+2 safety/equipment Stand pivot transfers: Min assist,Min guard General transfer comment: MinAx1-2 and extra time to power up to stand, cuing for pt to scoot anteriorly and keep hands on recliner to push up to stand then transition 1 hand at a time to RW once buttocks is cleared, success but poor carryover. Cued pt to reach back for chair to control descent with return to sit, min success.    Ambulation / Gait / Stairs / Wheelchair Mobility  Ambulation/Gait Ambulation/Gait assistance: Min assist,+2 safety/equipment,+2 physical assistance Gait Distance (Feet): 12 Feet (x2 bouts of ~8 ft > ~12 ft, seated rest break between bouts) Assistive device: Rolling walker (2 wheeled) Gait Pattern/deviations: Step-to pattern,Decreased stride length,Trunk flexed ("hop to") General Gait Details: Slow, short hopping steps, relying heavily on hands on RW during swing phase of R leg. Cued pt to monitor himself and his fatigue and to appropriately return to sit to rest prior to complete LOB due to fatigue, with success but reminders throughout. MinAx1-2 with chair follow to  maintain balance and safety. Gait velocity: decr Gait velocity interpretation: <1.31 ft/sec, indicative of household ambulator    Posture / Balance Dynamic Sitting Balance Sitting balance - Comments: close min guard-Min A for safety; posterior lean at times. Balance Overall balance assessment: Needs assistance Sitting-balance support: No upper extremity supported (foot supported) Sitting balance-Leahy Scale: Poor Sitting balance - Comments: close min guard-Min A for safety; posterior lean at times. Standing balance support: Bilateral upper extremity supported,During functional activity Standing balance-Leahy Scale: Poor Standing balance comment: Requires UE support and external support. High Level Balance Comments: with RW    Special needs/care consideration Wound Vac On L stump at surgical site, Skin Surgical incision, Blister, Cellulitis, Sherlynn Carbon  of [facility]  and Designated visitor [place] (from acute therapy documentation) Living Arrangements: Other relatives (cousin who cannot provide physical assist) Available Help at Discharge: Family,Available PRN/intermittently Type of Home: Mobile home Home Layout: One level Home Access: Ramped entrance Bathroom Shower/Tub: Government social research officer Accessibility: Yes Home Care Services: No Additional Comments: pt states his cousin is disabled and could not provide any physical assist  Discharge Living Setting Plans for Discharge Living Setting: Other (Comment) Type of Home at Discharge: Mobile home Discharge Home Layout: One level Discharge Home Access: 2 steps to enter (can reach both railings) Discharge Bathroom Shower/Tub: Tub/shower unit Discharge Bathroom Toilet: Standard Discharge Bathroom Accessibility: Yes How Accessible: Accessible via walker (not wc) Does the patient have any problems obtaining your medications?: No *Pt's home is not wc accessible.    Social/Family/Support Systems Patient Roles: Other (Comment) Contact Information: [phone] Anticipated Caregiver: Domenik Trice (neice) Anticipated Caregiver's Contact Information: [phone] Ability/Limitations of Caregiver: Can provide intermittent supervision only Caregiver Availability: Intermittent Discharge Plan Discussed with Primary Caregiver: Yes Is Caregiver In Agreement with Plan?: Yes Does Caregiver/Family have Issues with Lodging/Transportation while Pt is in Rehab?: No  Goals Patient/Family Goal for Rehab: PT/OT: mod I/intermittant Supervision; SLP: NA Expected length of stay: 10-12 days Pt/Family Agrees to Admission and willing to participate: Yes Program Orientation Provided & Reviewed with Pt/Caregiver Including Roles  & Responsibilities: Yes  Decrease burden of Care through IP rehab admission: NA  Possible need for SNF placement upon discharge: not anticipated. Per pt and his niece, he will have intermittent supervision at DC. Anticipate he can achieve that level through CIR program for a safe DC home.  Patient Condition: I have reviewed medical records from [facility], spoken with CM, RN and MD and patient and family member. I met with patient at the bedside for inpatient rehabilitation assessment.  Patient will benefit from ongoing PT and OT, can actively participate in 3 hours of therapy a day 5 days of the week, and can make measurable gains during the admission.  Patient will also benefit from the coordinated team approach during an Inpatient Acute Rehabilitation admission.  The patient will receive intensive therapy as well as Rehabilitation physician, nursing, social worker, and care management interventions.  Due to safety, skin/wound care, disease management, medication administration, pain management and patient education the patient requires 24 hour a day rehabilitation nursing.  The patient is currently min A +2  with mobility and Min A to  Mod A for basic ADLs.  Discharge setting and therapy post discharge at home with home health is anticipated.  Patient has agreed to participate in the Acute Inpatient Rehabilitation Program and will admit 09/19/20.  Preadmission Screen Completed By:  Farley Ly Staley,09/16/2020 9:00 AM with day of admit updates provided by Raechel Ache OTR/L on 09/19/20 at 11:03AM. ______________________________________________________________________   Discussed status with Dr. Letta Pate on 09/19/20 at 11:21AM and received approval for admission today.  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 Does the need for close, 24 hr/day Medical supervision in concert with the patient's rehab needs make it unreasonable for this patient to be served in a less intensive setting? Yes 2. Co-Morbidities requiring supervision/potential complications: History of coronary artery disease status post CABG, history of aortic dissection with redo thoracic aortic repair in  the past, A. fib on Eliquis, CKD 3, hypertension 3. Due to bladder management, bowel management, safety, skin/wound care, disease management, medication administration, pain management and patient education, does the patient require 24 hr/day rehab nursing? Yes 4. Does the patient require coordinated care of a physician, rehab nurse, PT, OT, and SLP to address physical and functional deficits in the context of the above medical diagnosis(es)? Yes Addressing deficits in the following areas: balance, endurance, locomotion, strength, transferring, bowel/bladder control, bathing, dressing, grooming, toileting, cognition and psychosocial support 5. Can the patient actively participate in an intensive therapy program of at least 3 hrs of therapy 5 days a week? Yes 6. The potential for patient to make  measurable gains while on inpatient rehab is good 7. Anticipated functional outcomes upon discharge from inpatient rehab: modified independent and supervision PT, modified independent and supervision OT, n/a SLP 8. Estimated rehab length of stay to reach the above functional goals is: 10 to 12 days 9. Anticipated discharge destination: Home 10. Overall Rehab/Functional Prognosis: excellent   MD Signature: Charlett Blake M.D. [facility] [employer] Fellow Am Acad of Phys Med and Rehab Diplomate Am Board of Electrodiagnostic Med Fellow Am Board of Interventional Pain

## 2020-09-17 ENCOUNTER — Inpatient Hospital Stay (HOSPITAL_COMMUNITY): Payer: Medicaid Other

## 2020-09-17 DIAGNOSIS — A409 Streptococcal sepsis, unspecified: Secondary | ICD-10-CM | POA: Diagnosis not present

## 2020-09-17 DIAGNOSIS — R52 Pain, unspecified: Secondary | ICD-10-CM | POA: Diagnosis not present

## 2020-09-17 DIAGNOSIS — R6520 Severe sepsis without septic shock: Secondary | ICD-10-CM | POA: Diagnosis not present

## 2020-09-17 LAB — HEMOGLOBIN AND HEMATOCRIT, BLOOD
HCT: 21.5 % — ABNORMAL LOW (ref 39.0–52.0)
HCT: 20.5 % — ABNORMAL LOW (ref 39.0–52.0)
Hemoglobin: 6.5 g/dL — CL (ref 13.0–17.0)
Hemoglobin: 6.5 g/dL — CL (ref 13.0–17.0)

## 2020-09-17 LAB — PREPARE RBC (CROSSMATCH)

## 2020-09-17 MED ORDER — DIPHENHYDRAMINE HCL 25 MG PO CAPS
25.0000 mg | ORAL_CAPSULE | Freq: Once | ORAL | Status: AC
  Administered 2020-09-17: 25 mg via ORAL
  Filled 2020-09-17: qty 1

## 2020-09-17 MED ORDER — SODIUM CHLORIDE 0.9% IV SOLUTION: Freq: Once | INTRAVENOUS | Status: AC

## 2020-09-17 MED ORDER — METHOCARBAMOL 500 MG PO TABS
1000.0000 mg | ORAL_TABLET | Freq: Four times a day (QID) | ORAL | Status: DC | PRN
  Administered 2020-09-18: 1000 mg via ORAL
  Filled 2020-09-17: qty 2

## 2020-09-17 MED ORDER — ACETAMINOPHEN 325 MG PO TABS
650.0000 mg | ORAL_TABLET | Freq: Once | ORAL | Status: AC
  Administered 2020-09-17: 650 mg via ORAL
  Filled 2020-09-17: qty 2

## 2020-09-17 NOTE — Progress Notes (Addendum)
° ° ° °  Subjective: 8 Days Post-Op Procedure(s) (LRB): REVISION LEFT ABOVE KNEE AMPUTATION (Left) Awake alert and oriented x 4. Dressing left AKA keeps sliding off the left thigh. New Mepilex bandaids applied and then ACE wrap with tape to assist in holding dressing and compression Wraps in place.    Patient reports pain as mild.    Objective:   VITALS:  Temp:  [97.8 F (36.6 C)-98.9 F (37.2 C)] 98.5 F (36.9 C) (01/22 1102) Pulse Rate:  [60-68] 60 (01/22 1102) Resp:  [16-18] 16 (01/22 1102) BP: (103-135)/(57-75) 133/68 (01/22 1102) SpO2:  [94 %-98 %] 94 % (01/22 1102)  Neurologically intact ABD soft Neurovascular intact Sensation intact distally Intact pulses distally No cellulitis present Compartment soft New dressings, no fluctuance or drainage noted left AKA residual limb.    LABS Recent Labs    09/16/20 1132 09/16/20 1427  HGB 6.2* 6.1*  WBC 8.1 8.3  PLT 248 225   Recent Labs    09/16/20 1427  NA 141  K 4.9  CL 106  CO2 27  BUN 24*  CREATININE 1.61*  GLUCOSE 129*   No results for input(s): LABPT, INR in the last 72 hours.   Assessment/Plan: 8 Days Post-Op Procedure(s) (LRB): REVISION LEFT ABOVE KNEE AMPUTATION (Left)  Anemia due to blood loss from surgery, clinically no drainage or hematoma left AKA stump, will check CT  Advance diet Up with therapy D/C IV fluids Continue ABX therapy due to fascitis, anemia is in the low 6.0s but appears relatively stable for the last 3 days Antibiotics can some times suppress bone marrow, I do not detect an area of active bleeding into the left thigh, his Compression dressing is not able to stay in place due to conical shape of his left thigh.    Vira Browns 09/17/2020, 12:41 PMPatient ID: [name], male   DOB: 04/29/1956, 65 y.o.   MRN: [id number]

## 2020-09-17 NOTE — Progress Notes (Addendum)
PROGRESS NOTE    Connor Connor [name]   ZOX:[id number]  DOB: 07-25-1956  DOA: 08/30/2020 PCP: Patient, Connor [name] Pcp Per   Brief Narrative:  Connor Connor [name] is a 65 y/o male with HTN, CKD 3, A-fib on Coumadin AAA, ascending aortic dissection s/p open rapair 2016 and redo on 2019, left command and external iliac stents 2020 complicated by cardiogenic shock and AKI requiring dialysis.   He presented to the ED with sepsis and rhabdomyolysis due to left leg cellulitis.  In ED > WBC 15, Lactic acid 2, CPK 28,000 and Cr of 4.2 (baseilne ~ 1.2- 1.4).  Hlater grew out strep group G in the blood. He improved overall, however, his leg did not seem to improved. On 1/12, he was ultimately taken for an AKA due to ongoing necrotizing fascitis of the leg.  His hospital course has been complicated by recurrent anemia requiring blood transfusions.    Subjective: Connor [name] new complaints.   Assessment & Plan:   Principal Problem: Severe Sepsis with acute renal failure without septic shock    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 - now s/p AKA of left leg - cont Wound vac per general surgery  - continue either Ceftriaxone (while in hospital) or Amoxil (if discharged) through 1/21 (today) - pain control remains an issue- he states he woke up in pain and it has yet to improve to a comfortable level- add Oyycontin 20 mg BID, Oxycodone > 5 mg QID routine, cont PRN IV Dilaudid- increased Nerontin from 300 mg TID to 400 mg today- follow for sedation - I have increased his narcotics to a point where he is using little to Connor [name] IV Dilaudid. He is functional at current doses without exhibiting signs of being oversedated or confused.   Active Problems:  Right leg pain - due to overuse in relation to left leg injuries/ infection - use Voltaren gel PRN with heating pad  A-fib, paroxysmal - has been on Heparin due to the procedure- transitioned back to Eliquis  - cont Cardizem and Labetalol  - Nifedipine on  hold  Anemia due to acute blood loss - he has received 6 Unit of PRBC- we are closely following Hb  - Hb dropped to 6.1 today- stop Eliquis- will obtain CT of leg and a retroperitoneal CT stat - have spoken with ortho, Dr Otelia Sergeant, about concern for bleeding - transfuse 1 U PRBC- discussed plan with patient - addendum CT scan> 10.3 cm AP x 5.3 cm TR x 12.4 cm hematoma in left thigh - post transfusion Hb is only 6.5- will recheck in 2 hrs.      AKI on CKD 3a - Cr rose from 4.21 to 7.61 on 17 and subsequently has improved to about 1.6 - baseline Cr is 1.2-1.5 - cause was likely sepsis complicated by a prerenal state (vomiting prior to admission) and rhabdomyolysis  Grade 2 diastolic CHF - compensated - Spironolactone on hold (BP has been low at times) - following volume status    Nausea vomiting and diarrhea - on admission- has resolved      Morbid obesity (HCC)  Body mass index is 35.02 kg/m.      Time spent in minutes: 35 DVT prophylaxis: Eliquis Code Status: Full code Family Communication:  Disposition Plan:  Status is: Inpatient  Remains inpatient appropriate because:Inpatient level of care appropriate due to severity of illness   Dispo: The patient is from: Home              Anticipated d/c  is to: CIR              Anticipated d/c date is: > 3 days              Patient currently is not medically stable to d/c.  Consultants:   Ortho  Cardiology  Nephrology  General surgery   ID Procedures:   Revision of left knee amputation Antimicrobials:  Anti-infectives (From admission, onward)   Start     Dose/Rate Route Frequency Ordered Stop   09/09/20 0600  ceFAZolin (ANCEF) IVPB 2g/100 mL premix        2 g 200 mL/hr over 30 Minutes Intravenous To Short Stay 09/09/20 0213 09/09/20 1103   09/08/20 1000  linezolid (ZYVOX) IVPB 600 mg  Status:  Discontinued        600 mg 300 mL/hr over 60 Minutes Intravenous Every 12 hours 09/08/20 0838 09/11/20 1104   09/07/20  1015  ceFAZolin (ANCEF) 3 g in dextrose 5 % 50 mL IVPB  Status:  Discontinued        3 g 100 mL/hr over 30 Minutes Intravenous On call to O.R. 09/07/20 1009 09/07/20 1043   09/07/20 0600  ceFAZolin (ANCEF) IVPB 2g/100 mL premix        2 g 200 mL/hr over 30 Minutes Intravenous On call to O.R. 09/06/20 1651 09/07/20 0855   09/02/20 2200  cefTRIAXone (ROCEPHIN) 2 g in sodium chloride 0.9 % 100 mL IVPB        2 g 200 mL/hr over 30 Minutes Intravenous Every 24 hours 09/02/20 1044 09/16/20 2157   09/01/20 2200  vancomycin (VANCOREADY) IVPB 1500 mg/300 mL  Status:  Discontinued        1,500 mg 150 mL/hr over 120 Minutes Intravenous Every 48 hours 08/31/20 0350 08/31/20 1719   09/01/20 0130  ceFEPIme (MAXIPIME) 2 g in sodium chloride 0.9 % 100 mL IVPB  Status:  Discontinued        2 g 200 mL/hr over 30 Minutes Intravenous Every 24 hours 08/31/20 1812 09/02/20 1043   09/01/20 0100  metroNIDAZOLE (FLAGYL) IVPB 500 mg  Status:  Discontinued        500 mg 100 mL/hr over 60 Minutes Intravenous Every 8 hours 08/31/20 1751 08/31/20 1926   08/31/20 2200  ceFEPIme (MAXIPIME) 2 g in sodium chloride 0.9 % 100 mL IVPB  Status:  Discontinued        2 g 200 mL/hr over 30 Minutes Intravenous Every 24 hours 08/31/20 0350 08/31/20 1719   08/31/20 2030  vancomycin (VANCOREADY) IVPB 1500 mg/300 mL        1,500 mg 150 mL/hr over 120 Minutes Intravenous  Once 08/31/20 2016 08/31/20 2338   08/31/20 2015  clindamycin (CLEOCIN) IVPB 900 mg  Status:  Discontinued        900 mg 100 mL/hr over 30 Minutes Intravenous Every 8 hours 08/31/20 1926 09/01/20 1033   08/31/20 1812  vancomycin variable dose per unstable renal function (pharmacist dosing)  Status:  Discontinued         Does not apply See admin instructions 08/31/20 1812 09/01/20 1033   08/31/20 1730  cefTRIAXone (ROCEPHIN) 2 g in sodium chloride 0.9 % 100 mL IVPB  Status:  Discontinued        2 g 200 mL/hr over 30 Minutes Intravenous Every 24 hours 08/31/20 1720  08/31/20 1751   08/31/20 1015  metroNIDAZOLE (FLAGYL) IVPB 500 mg  Status:  Discontinued        500  mg 100 mL/hr over 60 Minutes Intravenous Every 8 hours 08/31/20 1012 08/31/20 1719   08/31/20 0045  ceFEPIme (MAXIPIME) 2 g in sodium chloride 0.9 % 100 mL IVPB        2 g 200 mL/hr over 30 Minutes Intravenous  Once 08/31/20 0030 08/31/20 0208   08/31/20 0000  vancomycin (VANCOREADY) IVPB 1500 mg/300 mL        1,500 mg 150 mL/hr over 120 Minutes Intravenous  Once 08/30/20 2351 08/31/20 0208   08/30/20 1900  cefTRIAXone (ROCEPHIN) 2 g in sodium chloride 0.9 % 100 mL IVPB  Status:  Discontinued        2 g 200 mL/hr over 30 Minutes Intravenous Every 24 hours 08/30/20 1855 08/31/20 0030       Objective: Vitals:   09/17/20 0800 09/17/20 0832 09/17/20 1102 09/17/20 1258  BP: (!) 123/57 135/75 133/68 134/72  Pulse: 68 66 60 (!) 57  Resp: 18 18 16 16   Temp: 98.2 F (36.8 C) 97.8 F (36.6 C) 98.5 F (36.9 C) 98.3 F (36.8 C)  TempSrc: Oral Oral Oral Oral  SpO2: 96% 97% 94% 93%  Weight:      Height:        Intake/Output Summary (Last 24 hours) at 09/17/2020 1440 Last data filed at 09/17/2020 1104 Gross per 24 hour  Intake 629.67 ml  Output 500 ml  Net 129.67 ml   Filed Weights   09/08/20 0405 09/09/20 0406 09/11/20 0432  Weight: 112.3 kg 109.6 kg 113.9 kg    Examination: General exam: Appears comfortable  HEENT: PERRLA, oral mucosa moist, Connor [name] sclera icterus or thrush Respiratory system: Clear to auscultation. Respiratory effort normal. Cardiovascular system: S1 & S2 heard, regular rate and rhythm Gastrointestinal system: Abdomen soft, non-tender, nondistended. Normal bowel sounds   Central nervous system: Alert and oriented. Connor [name] focal neurological deficits. Extremities: Connor [name] cyanosis, clubbing > edema of left leg s/p AKA Skin: Connor [name] rashes or ulcers- left stump incision is oozing serosanguinous fluid from lower part of incition Psychiatry:  Mood & affect appropriate.     Data  Reviewed: I have personally reviewed following labs and imaging studies  CBC: Recent Labs  Lab 09/11/20 0302 09/12/20 0209 09/13/20 0352 09/14/20 0404 09/16/20 1132 09/16/20 1427 09/17/20 1335  WBC 14.6* 12.7* 14.7* 14.9* 8.1 8.3  --   NEUTROABS 11.1* 8.9* 11.5* 11.3*  --   --   --   HGB 7.3* 8.0* 8.1* 7.2* 6.2* 6.1* 6.5*  HCT 22.5* 26.5* 25.2* 22.7* 20.6* 19.6* 21.5*  MCV 96.2 99.3 97.3 98.3 100.0 99.0  --   PLT 268 358 314 273 248 225  --    Basic Metabolic Panel: Recent Labs  Lab 09/11/20 0302 09/12/20 0209 09/13/20 0352 09/14/20 0404 09/16/20 1427  NA 145 145 141 140 141  K 3.9 4.4 4.4 4.4 4.9  CL 110 110 107 107 106  CO2 25 26 25 23 27   GLUCOSE 120* 96 103* 96 129*  BUN 24* 20 21 24* 24*  CREATININE 1.90* 1.67* 1.48* 1.70* 1.61*  CALCIUM 7.3* 7.9* 7.9* 7.8* 8.1*  MG 1.4* 1.6* 1.7 1.8  --   PHOS 3.4 3.3 3.5 3.7  --    GFR: Estimated Creatinine Clearance: 60.2 mL/min (A) (by C-G formula based on SCr of 1.61 mg/dL (H)). Liver Function Tests: Recent Labs  Lab 09/11/20 0302 09/12/20 0209 09/13/20 0352 09/14/20 0404 09/16/20 1427  AST 22 22 22 21  35  ALT 16 18 17 15 25   ALKPHOS 53  58 63 63 124  BILITOT 0.7 0.8 0.8 1.1 1.0  PROT 6.1* 6.5 6.7 5.9* 6.4*  ALBUMIN 2.1* 2.3* 2.4* 2.2* 2.0*   Connor [name] results for input(s): LIPASE, AMYLASE in the last 168 hours. Connor [name] results for input(s): AMMONIA in the last 168 hours. Coagulation Profile: Connor [name] results for input(s): INR, PROTIME in the last 168 hours. Cardiac Enzymes: Recent Labs  Lab 09/11/20 0302 09/12/20 0209 09/13/20 0352 09/14/20 0404 09/15/20 0258  CKTOTAL 390 363 307 238 202   BNP (last 3 results) Connor [name] results for input(s): PROBNP in the last 8760 hours. HbA1C: Connor [name] results for input(s): HGBA1C in the last 72 hours. CBG: Connor [name] results for input(s): GLUCAP in the last 168 hours. Lipid Profile: Connor [name] results for input(s): CHOL, HDL, LDLCALC, TRIG, CHOLHDL, LDLDIRECT in the last 72 hours. Thyroid Function  Tests: Connor [name] results for input(s): TSH, T4TOTAL, FREET4, T3FREE, THYROIDAB in the last 72 hours. Anemia Panel: Connor [name] results for input(s): VITAMINB12, FOLATE, FERRITIN, TIBC, IRON, RETICCTPCT in the last 72 hours. Urine analysis:    Component Value Date/Time   COLORURINE AMBER (A) 08/31/2020 0130   APPEARANCEUR CLOUDY (A) 08/31/2020 0130   LABSPEC 1.017 08/31/2020 0130   PHURINE 5.0 08/31/2020 0130   GLUCOSEU NEGATIVE 08/31/2020 0130   HGBUR LARGE (A) 08/31/2020 0130   BILIRUBINUR NEGATIVE 08/31/2020 0130   KETONESUR NEGATIVE 08/31/2020 0130   PROTEINUR 100 (A) 08/31/2020 0130   NITRITE NEGATIVE 08/31/2020 0130   LEUKOCYTESUR NEGATIVE 08/31/2020 0130   Sepsis Labs: @LABRCNTIP (procalcitonin:4,lacticidven:4) ) Recent Results (from the past 240 hour(s))  Surgical pcr screen     Status: None   Collection Time: 09/09/20  4:22 AM   Specimen: Nasal Mucosa; Nasal Swab  Result Value Ref Range Status   MRSA, PCR NEGATIVE NEGATIVE Final   Staphylococcus aureus NEGATIVE NEGATIVE Final    Comment: (NOTE) The Xpert SA Assay (FDA approved for NASAL specimens in patients 23 years of age and older), is one component of a comprehensive surveillance program. It is not intended to diagnose infection nor to guide or monitor treatment. Performed at [facility], [address], [place], [place]          Radiology Studies: CT EXTREMITY LOWER LEFT WO CONTRAST  Result Date: 09/17/2020 CLINICAL DATA:  Sepsis.  Soft tissue infection suspected EXAM: CT OF THE LOWER LEFT EXTREMITY WITHOUT CONTRAST CT OF THE LOWER RIGHT EXTREMITY WITHOUT CONTRAST TECHNIQUE: Multidetector CT imaging of the lower left extremity and lower right extremity was performed according to the standard protocol. 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 Connor [name] erosion or periostitis at the resection margin to suggest osteomyelitis.  Connor [name] acute fracture or malalignment. 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 Ligaments Suboptimally assessed by CT. Muscles and Tendons Postsurgical changes to the left thigh musculature. Connor [name] acute tendinous injury. Soft tissues There is an ill-defined mixed density fluid collection within the soft tissues at the distal stump laterally. There are mixed areas of high attenuation fluid with areas of more decreased attenuation as well as multiple locules of soft tissue gas. Approximate measurements of the collection are 10.3 cm AP x 5.3 cm TR x 12.4 cm CC (series 6, image 266; series 9, image 103). Collection approximates the surgical incision site as evident by overlying skin staples. Connor [name] foci of gas within the soft tissues tracking beyond the site of fluid collection. Subcutaneous edema within the left thigh most pronounced anteriorly at the level  of the hip. Connor [name] deep fascial fluid collection. Multiple mildly enlarged left inguinal lymph nodes are again seen. Subcutaneous edema throughout the right lower extremity most pronounced along the lateral aspect of the knee and calf. Connor [name] organized fluid collection. Connor [name] soft tissue gas within the right lower extremity. Connor [name] deep fascial fluid collections are evident. For findings within the pelvis. Please see concurrently obtained dedicated CT of the chest, abdomen, and pelvis. IMPRESSION: 1. Ill-defined mixed density fluid collection within the soft tissues at the distal stump laterally measuring approximately 12.4 x 5.3 x 10.3 cm. Findings are suggestive of hematoma. There are multiple foci of air within the collection, which may be related to recent surgery. Superimposed infected collection not excluded. 2. Connor [name] evidence of osteomyelitis by CT. 3. Nonspecific subcutaneous edema within the left thigh and throughout the right lower extremity, which may reflect cellulitis. Connor [name] additional organized fluid collection. 4. Multiple mildly enlarged left inguinal lymph nodes, likely  reactive. 5. For findings within the pelvis. Please see concurrently obtained dedicated CT of the chest, abdomen, and pelvis. Electronically Signed   By: Duanne GuessNicholas  Plundo D.O.   On: 09/17/2020 13:26   CT EXTREMITY LOWER RIGHT WO CONTRAST  Result Date: 09/17/2020 CLINICAL DATA:  Sepsis.  Soft tissue infection suspected EXAM: CT OF THE LOWER LEFT EXTREMITY WITHOUT CONTRAST CT OF THE LOWER RIGHT EXTREMITY WITHOUT CONTRAST TECHNIQUE: Multidetector CT imaging of the lower left extremity and lower right extremity was performed according to the standard protocol. 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 Connor [name] erosion or periostitis at the resection margin to suggest osteomyelitis. Connor [name] acute fracture or malalignment. 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 Ligaments Suboptimally assessed by CT. Muscles and Tendons Postsurgical changes to the left thigh musculature. Connor [name] acute tendinous injury. Soft tissues There is an ill-defined mixed density fluid collection within the soft tissues at the distal stump laterally. There are mixed areas of high attenuation fluid with areas of more decreased attenuation as well as multiple locules of soft tissue gas. Approximate measurements of the collection are 10.3 cm AP x 5.3 cm TR x 12.4 cm CC (series 6, image 266; series 9, image 103). Collection approximates the surgical incision site as evident by overlying skin staples. Connor [name] foci of gas within the soft tissues tracking beyond the site of fluid collection. Subcutaneous edema within the left thigh most pronounced anteriorly at the level of the hip. Connor [name] deep fascial fluid collection. Multiple mildly enlarged left inguinal lymph nodes are again seen. Subcutaneous edema throughout the right lower extremity most pronounced along the lateral aspect of the knee and calf. Connor [name] organized fluid collection. Connor [name] soft tissue gas within the right lower  extremity. Connor [name] deep fascial fluid collections are evident. For findings within the pelvis. Please see concurrently obtained dedicated CT of the chest, abdomen, and pelvis. IMPRESSION: 1. Ill-defined mixed density fluid collection within the soft tissues at the distal stump laterally measuring approximately 12.4 x 5.3 x 10.3 cm. Findings are suggestive of hematoma. There are multiple foci of air within the collection, which may be related to recent surgery. Superimposed infected collection not excluded. 2. Connor [name] evidence of osteomyelitis by CT. 3. Nonspecific subcutaneous edema within the left thigh and throughout the right lower extremity, which may reflect cellulitis. Connor [name] additional organized fluid collection. 4. Multiple mildly enlarged left inguinal lymph nodes, likely reactive. 5. For findings within the pelvis. Please see concurrently obtained dedicated CT of the chest, abdomen,  and pelvis. Electronically Signed   By: Duanne Guess D.O.   On: 09/17/2020 13:26   CT CHEST ABDOMEN PELVIS WO CONTRAST  Result Date: 09/17/2020 CLINICAL DATA:  Suspected retroperitoneal hematoma . Anemia. Severe sepsis and acute renal failure. Necrotizing fasciitis. 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 EXAM: CT CHEST, ABDOMEN AND PELVIS WITHOUT CONTRAST TECHNIQUE: Multidetector CT imaging of the chest, abdomen and pelvis was performed following the standard protocol without IV contrast. COMPARISON:  09/01/2020 abdominal ultrasound. 03/01/2020 CTA of the chest, abdomen, and pelvis. FINDINGS: CT CHEST FINDINGS Cardiovascular: Mild motion degradation. Status post ascending aortic repair and transverse aortic stent graft. The descending thoracic aorta measures on the order of 5.3 cm on 39/3, similar to on the prior. The dissection is not well evaluated on this noncontrast exam. Tortuous descending thoracic aorta. Moderate cardiomegaly, without pericardial effusion. Lad coronary artery calcification. Pulmonary artery  enlargement, outflow tract 3.5 cm. Mediastinum/Nodes: Right chest wall surgical clips. Right paratracheal node of 1.7 cm on 27/3, similar. Hilar regions poorly evaluated without intravenous contrast. Lungs/Pleura: Trace bilateral pleural fluid, new since prior CT. Left base scarring or subsegmental atelectasis. Posterior left upper lobe dependent atelectasis as well. Musculoskeletal: Marked bilateral gynecomastia. Prior median sternotomy. Connor [name] acute osseous abnormality. CT ABDOMEN PELVIS FINDINGS Hepatobiliary: Hepatomegaly at 21.4 cm craniocaudal. Dependent gallstones. Connor [name] acute cholecystitis or biliary duct dilatation. Pancreas: Normal, without mass or ductal dilatation. Spleen: Normal in size, without focal abnormality. Adrenals/Urinary Tract: Normal adrenal glands. Connor [name] renal calculi or hydronephrosis. Connor [name] hydroureter or ureteric calculi. Connor [name] bladder calculi. Stomach/Bowel: Normal stomach, without wall thickening. Colonic stool burden suggests constipation. Normal terminal ileum and appendix. Normal small bowel. Vascular/Lymphatic: Left renal artery stent. Left common and external iliac artery stents. Connor [name] pelvic or abdominal retroperitoneal hemorrhage/hematoma. Connor [name] abdominopelvic adenopathy. Reproductive: Normal prostate. Other: Connor [name] significant free fluid. Connor [name] free intraperitoneal air. Extensive subcutaneous edema throughout the proximal left lower extremity. Intramuscular edema and fluid are incompletely imaged, including on 136/3. Musculoskeletal: Connor [name] acute osseous abnormality. IMPRESSION: 1. Subcutaneous and intramuscular edema/fluid throughout the imaged proximal left lower extremity. This will be more completely evaluated on dedicated extremity CT, dictated separately. 2. Connor [name] pelvic or abdominal retroperitoneal hematoma identified. 3. Similar appearance of the thoracic aorta, status post ascending aortic repair and transverse aortic stent graft. 4. Aortic atherosclerosis (ICD10-I70.0) and emphysema (ICD10-J43.9). 5.  Tiny bilateral pleural effusions. 6. Cholelithiasis. 7.  Possible constipation. 8. Pulmonary artery enlargement suggests pulmonary arterial hypertension. 9. Mild thoracic adenopathy, favored to be reactive. 10. Bilateral marked gynecomastia. Electronically Signed   By: Jeronimo Greaves M.D.   On: 09/17/2020 13:26      Scheduled Meds: . aspirin EC  81 mg Oral Daily  . diclofenac Sodium  2 g Topical QID  . diltiazem  240 mg Oral Daily  . feeding supplement  237 mL Oral BID BM  . gabapentin  400 mg Oral TID  . labetalol  400 mg Oral BID  . mouth rinse  15 mL Mouth Rinse BID  . oxyCODONE  5-10 mg Oral QID  . oxyCODONE  20 mg Oral Q12H  . polyethylene glycol  17 g Oral Daily  . sodium chloride flush  10-40 mL Intracatheter Q12H   Continuous Infusions:    LOS: 18 days      Calvert Cantor, MD [facility] Pager: [email or web address] 09/17/2020, 2:40 PM

## 2020-09-17 NOTE — Progress Notes (Signed)
PROGRESS NOTE    Kreg Earhart   XBM:[id number]  DOB: [birth year]  DOA: 08/30/2020 PCP: Patient, No Pcp Per   Brief Narrative:  Tab Rylee is a 65 y/o male with HTN, CKD 3, A-fib on Coumadin AAA, ascending aortic dissection s/p open rapair 2016 and redo on 2019, left command and external iliac stents 2020 complicated by cardiogenic shock and AKI requiring dialysis.   He presented to the ED with sepsis and rhabdomyolysis due to left leg cellulitis.  In ED > WBC 15, Lactic acid 2, CPK 28,000 and Cr of 4.2 (baseilne ~ 1.2- 1.4).  Hlater grew out strep group G in the blood. He improved overall, however, his leg did not seem to improved. On 1/12, he was ultimately taken for an AKA due to ongoing necrotizing fascitis of the leg.  His hospital course has been complicated by recurrent anemia requiring blood transfusions.    Subjective: No new complaints.   Assessment & Plan:   Principal Problem: Severe Sepsis with acute renal failure without septic shock    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 - now s/p AKA of left leg - cont Wound vac per general surgery  - continue either Ceftriaxone (while in hospital) or Amoxil (if discharged) through 1/21 (today) - pain control remains an issue- he states he woke up in pain and it has yet to improve to a comfortable level- add Oyycontin 20 mg BID, Oxycodone > 5 mg QID routine, cont PRN IV Dilaudid- increased Nerontin from 300 mg TID to 400 mg today- follow for sedation - I have increased his narcotics to a point where he is using little to no IV Dilaudid. He is functional at current doses without exhibiting signs of being oversedated or confused.   Active Problems:  Right leg pain - due to overuse in relation to left leg injuries/ infection - use Voltaren gel PRN with heating pad  A-fib, paroxysmal - has been on Heparin due to the procedure- transitioned back to Eliquis  - cont Cardizem and Labetalol  - Nifedipine on  hold  Anemia due to acute blood loss - he has received 6 Unit of PRBC- we are closely following Hb  - Hb dropped to 6.1 today- will obtain CT of leg and a retroperitoneal CT stat - have spoken with ortho, Dr Otelia Sergeant, about concern for bleeding - transfuse 1 U PRBC- discussed plan with patient     AKI on CKD 3a - Cr rose from 4.21 to 7.61 on 17 and subsequently has improved to about 1.6 - baseline Cr is 1.2-1.5 - cause was likely sepsis complicated by a prerenal state (vomiting prior to admission) and rhabdomyolysis  Grade 2 diastolic CHF - compensated - Spironolactone on hold (BP has been low at times) - following volume status    Nausea vomiting and diarrhea - on admission- has resolved      Morbid obesity (HCC)  Body mass index is 35.02 kg/m.      Time spent in minutes: 35 DVT prophylaxis: Eliquis Code Status: Full code Family Communication:  Disposition Plan:  Status is: Inpatient  Remains inpatient appropriate because:Inpatient level of care appropriate due to severity of illness   Dispo: The patient is from: Home              Anticipated d/c is to: CIR              Anticipated d/c date is: > 3 days  Patient currently is not medically stable to d/c.  Consultants:   Ortho  Cardiology  Nephrology  General surgery   ID Procedures:   Revision of left knee amputation Antimicrobials:  Anti-infectives (From admission, onward)   Start     Dose/Rate Route Frequency Ordered Stop   09/09/20 0600  ceFAZolin (ANCEF) IVPB 2g/100 mL premix        2 g 200 mL/hr over 30 Minutes Intravenous To Short Stay 09/09/20 0213 09/09/20 1103   09/08/20 1000  linezolid (ZYVOX) IVPB 600 mg  Status:  Discontinued        600 mg 300 mL/hr over 60 Minutes Intravenous Every 12 hours 09/08/20 0838 09/11/20 1104   09/07/20 1015  ceFAZolin (ANCEF) 3 g in dextrose 5 % 50 mL IVPB  Status:  Discontinued        3 g 100 mL/hr over 30 Minutes Intravenous On call to O.R. 09/07/20  1009 09/07/20 1043   09/07/20 0600  ceFAZolin (ANCEF) IVPB 2g/100 mL premix        2 g 200 mL/hr over 30 Minutes Intravenous On call to O.R. 09/06/20 1651 09/07/20 0855   09/02/20 2200  cefTRIAXone (ROCEPHIN) 2 g in sodium chloride 0.9 % 100 mL IVPB        2 g 200 mL/hr over 30 Minutes Intravenous Every 24 hours 09/02/20 1044 09/16/20 2157   09/01/20 2200  vancomycin (VANCOREADY) IVPB 1500 mg/300 mL  Status:  Discontinued        1,500 mg 150 mL/hr over 120 Minutes Intravenous Every 48 hours 08/31/20 0350 08/31/20 1719   09/01/20 0130  ceFEPIme (MAXIPIME) 2 g in sodium chloride 0.9 % 100 mL IVPB  Status:  Discontinued        2 g 200 mL/hr over 30 Minutes Intravenous Every 24 hours 08/31/20 1812 09/02/20 1043   09/01/20 0100  metroNIDAZOLE (FLAGYL) IVPB 500 mg  Status:  Discontinued        500 mg 100 mL/hr over 60 Minutes Intravenous Every 8 hours 08/31/20 1751 08/31/20 1926   08/31/20 2200  ceFEPIme (MAXIPIME) 2 g in sodium chloride 0.9 % 100 mL IVPB  Status:  Discontinued        2 g 200 mL/hr over 30 Minutes Intravenous Every 24 hours 08/31/20 0350 08/31/20 1719   08/31/20 2030  vancomycin (VANCOREADY) IVPB 1500 mg/300 mL        1,500 mg 150 mL/hr over 120 Minutes Intravenous  Once 08/31/20 2016 08/31/20 2338   08/31/20 2015  clindamycin (CLEOCIN) IVPB 900 mg  Status:  Discontinued        900 mg 100 mL/hr over 30 Minutes Intravenous Every 8 hours 08/31/20 1926 09/01/20 1033   08/31/20 1812  vancomycin variable dose per unstable renal function (pharmacist dosing)  Status:  Discontinued         Does not apply See admin instructions 08/31/20 1812 09/01/20 1033   08/31/20 1730  cefTRIAXone (ROCEPHIN) 2 g in sodium chloride 0.9 % 100 mL IVPB  Status:  Discontinued        2 g 200 mL/hr over 30 Minutes Intravenous Every 24 hours 08/31/20 1720 08/31/20 1751   08/31/20 1015  metroNIDAZOLE (FLAGYL) IVPB 500 mg  Status:  Discontinued        500 mg 100 mL/hr over 60 Minutes Intravenous Every 8  hours 08/31/20 1012 08/31/20 1719   08/31/20 0045  ceFEPIme (MAXIPIME) 2 g in sodium chloride 0.9 % 100 mL IVPB  2 g 200 mL/hr over 30 Minutes Intravenous  Once 08/31/20 0030 08/31/20 0208   08/31/20 0000  vancomycin (VANCOREADY) IVPB 1500 mg/300 mL        1,500 mg 150 mL/hr over 120 Minutes Intravenous  Once 08/30/20 2351 08/31/20 0208   08/30/20 1900  cefTRIAXone (ROCEPHIN) 2 g in sodium chloride 0.9 % 100 mL IVPB  Status:  Discontinued        2 g 200 mL/hr over 30 Minutes Intravenous Every 24 hours 08/30/20 1855 08/31/20 0030       Objective: Vitals:   09/17/20 0500 09/17/20 0800 09/17/20 0832 09/17/20 1102  BP: 127/68 (!) 123/57 135/75 133/68  Pulse: 61 68 66 60  Resp: 18 18 18 16   Temp: 98.4 F (36.9 C) 98.2 F (36.8 C) 97.8 F (36.6 C) 98.5 F (36.9 C)  TempSrc: Oral Oral Oral Oral  SpO2: 97% 96% 97% 94%  Weight:      Height:        Intake/Output Summary (Last 24 hours) at 09/17/2020 1213 Last data filed at 09/17/2020 1104 Gross per 24 hour  Intake 629.67 ml  Output 500 ml  Net 129.67 ml   Filed Weights   09/08/20 0405 09/09/20 0406 09/11/20 0432  Weight: 112.3 kg 109.6 kg 113.9 kg    Examination: General exam: Appears comfortable  HEENT: PERRLA, oral mucosa moist, no sclera icterus or thrush Respiratory system: Clear to auscultation. Respiratory effort normal. Cardiovascular system: S1 & S2 heard, regular rate and rhythm Gastrointestinal system: Abdomen soft, non-tender, nondistended. Normal bowel sounds   Central nervous system: Alert and oriented. No focal neurological deficits. Extremities: No cyanosis, clubbing > edema of left leg s/p AKA Skin: No rashes or ulcers- left stump incision is oozing serosanguinous fluid from lower part of incition Psychiatry:  Mood & affect appropriate.     Data Reviewed: I have personally reviewed following labs and imaging studies  CBC: Recent Labs  Lab 09/11/20 0302 09/12/20 0209 09/13/20 0352 09/14/20 0404  09/16/20 1132 09/16/20 1427  WBC 14.6* 12.7* 14.7* 14.9* 8.1 8.3  NEUTROABS 11.1* 8.9* 11.5* 11.3*  --   --   HGB 7.3* 8.0* 8.1* 7.2* 6.2* 6.1*  HCT 22.5* 26.5* 25.2* 22.7* 20.6* 19.6*  MCV 96.2 99.3 97.3 98.3 100.0 99.0  PLT 268 358 314 273 248 225   Basic Metabolic Panel: Recent Labs  Lab 09/11/20 0302 09/12/20 0209 09/13/20 0352 09/14/20 0404 09/16/20 1427  NA 145 145 141 140 141  K 3.9 4.4 4.4 4.4 4.9  CL 110 110 107 107 106  CO2 25 26 25 23 27   GLUCOSE 120* 96 103* 96 129*  BUN 24* 20 21 24* 24*  CREATININE 1.90* 1.67* 1.48* 1.70* 1.61*  CALCIUM 7.3* 7.9* 7.9* 7.8* 8.1*  MG 1.4* 1.6* 1.7 1.8  --   PHOS 3.4 3.3 3.5 3.7  --    GFR: Estimated Creatinine Clearance: 60.2 mL/min (A) (by C-G formula based on SCr of 1.61 mg/dL (H)). Liver Function Tests: Recent Labs  Lab 09/11/20 0302 09/12/20 0209 09/13/20 0352 09/14/20 0404 09/16/20 1427  AST 22 22 22 21  35  ALT 16 18 17 15 25   ALKPHOS 53 58 63 63 124  BILITOT 0.7 0.8 0.8 1.1 1.0  PROT 6.1* 6.5 6.7 5.9* 6.4*  ALBUMIN 2.1* 2.3* 2.4* 2.2* 2.0*   No results for input(s): LIPASE, AMYLASE in the last 168 hours. No results for input(s): AMMONIA in the last 168 hours. Coagulation Profile: No results for input(s): INR,  PROTIME in the last 168 hours. Cardiac Enzymes: Recent Labs  Lab 09/11/20 0302 09/12/20 0209 09/13/20 0352 09/14/20 0404 09/15/20 0258  CKTOTAL 390 363 307 238 202   BNP (last 3 results) No results for input(s): PROBNP in the last 8760 hours. HbA1C: No results for input(s): HGBA1C in the last 72 hours. CBG: No results for input(s): GLUCAP in the last 168 hours. Lipid Profile: No results for input(s): CHOL, HDL, LDLCALC, TRIG, CHOLHDL, LDLDIRECT in the last 72 hours. Thyroid Function Tests: No results for input(s): TSH, T4TOTAL, FREET4, T3FREE, THYROIDAB in the last 72 hours. Anemia Panel: No results for input(s): VITAMINB12, FOLATE, FERRITIN, TIBC, IRON, RETICCTPCT in the last 72  hours. Urine analysis:    Component Value Date/Time   COLORURINE AMBER (A) 08/31/2020 0130   APPEARANCEUR CLOUDY (A) 08/31/2020 0130   LABSPEC 1.017 08/31/2020 0130   PHURINE 5.0 08/31/2020 0130   GLUCOSEU NEGATIVE 08/31/2020 0130   HGBUR LARGE (A) 08/31/2020 0130   BILIRUBINUR NEGATIVE 08/31/2020 0130   KETONESUR NEGATIVE 08/31/2020 0130   PROTEINUR 100 (A) 08/31/2020 0130   NITRITE NEGATIVE 08/31/2020 0130   LEUKOCYTESUR NEGATIVE 08/31/2020 0130   Sepsis Labs: @LABRCNTIP (procalcitonin:4,lacticidven:4) ) Recent Results (from the past 240 hour(s))  Surgical pcr screen     Status: None   Collection Time: 09/09/20  4:22 AM   Specimen: Nasal Mucosa; Nasal Swab  Result Value Ref Range Status   MRSA, PCR NEGATIVE NEGATIVE Final   Staphylococcus aureus NEGATIVE NEGATIVE Final    Comment: (NOTE) The Xpert SA Assay (FDA approved for NASAL specimens in patients 79 years of age and older), is one component of a comprehensive surveillance program. It is not intended to diagnose infection nor to guide or monitor treatment. Performed at [facility], [address] [place], [place]          Radiology Studies: No results found.    Scheduled Meds: . aspirin EC  81 mg Oral Daily  . diclofenac Sodium  2 g Topical QID  . diltiazem  240 mg Oral Daily  . feeding supplement  237 mL Oral BID BM  . gabapentin  400 mg Oral TID  . labetalol  400 mg Oral BID  . mouth rinse  15 mL Mouth Rinse BID  . oxyCODONE  5-10 mg Oral QID  . oxyCODONE  20 mg Oral Q12H  . polyethylene glycol  17 g Oral Daily  . sodium chloride flush  10-40 mL Intracatheter Q12H   Continuous Infusions:    LOS: 18 days      [id number], MD [facility] Pager: [email or web address] 09/17/2020, 12:13 PM

## 2020-09-17 NOTE — Progress Notes (Signed)
CRITICAL VALUE ALERT  Critical Value:  Hemglobin 6.5  Date & Time Notied:  1/22 1421  Provider Notified: Yes  Orders Received/Actions taken: Awaiting orders

## 2020-09-18 DIAGNOSIS — A409 Streptococcal sepsis, unspecified: Secondary | ICD-10-CM | POA: Diagnosis not present

## 2020-09-18 DIAGNOSIS — R52 Pain, unspecified: Secondary | ICD-10-CM | POA: Diagnosis not present

## 2020-09-18 DIAGNOSIS — R6520 Severe sepsis without septic shock: Secondary | ICD-10-CM | POA: Diagnosis not present

## 2020-09-18 LAB — IRON AND TIBC
Iron: 32 ug/dL — ABNORMAL LOW (ref 45–182)
Saturation Ratios: 14 % — ABNORMAL LOW (ref 17.9–39.5)
TIBC: 224 ug/dL — ABNORMAL LOW (ref 250–450)
UIBC: 192 ug/dL

## 2020-09-18 LAB — TYPE AND SCREEN
ABO/RH(D): O POS
Antibody Screen: NEGATIVE
Unit division: 0
Unit division: 0

## 2020-09-18 LAB — CBC
HCT: 22.6 % — ABNORMAL LOW (ref 39.0–52.0)
HCT: 24.2 % — ABNORMAL LOW (ref 39.0–52.0)
Hemoglobin: 7.2 g/dL — ABNORMAL LOW (ref 13.0–17.0)
Hemoglobin: 7.4 g/dL — ABNORMAL LOW (ref 13.0–17.0)
MCH: 30.4 pg (ref 26.0–34.0)
MCH: 29.5 pg (ref 26.0–34.0)
MCHC: 31.9 g/dL (ref 30.0–36.0)
MCHC: 30.6 g/dL (ref 30.0–36.0)
MCV: 95.4 fL (ref 80.0–100.0)
MCV: 96.4 fL (ref 80.0–100.0)
Platelets: 231 10*3/uL (ref 150–400)
Platelets: 217 10*3/uL (ref 150–400)
RBC: 2.37 MIL/uL — ABNORMAL LOW (ref 4.22–5.81)
RBC: 2.51 MIL/uL — ABNORMAL LOW (ref 4.22–5.81)
RDW: 16.9 % — ABNORMAL HIGH (ref 11.5–15.5)
RDW: 16.8 % — ABNORMAL HIGH (ref 11.5–15.5)
WBC: 7.3 10*3/uL (ref 4.0–10.5)
WBC: 8 10*3/uL (ref 4.0–10.5)
nRBC: 0.3 % — ABNORMAL HIGH (ref 0.0–0.2)
nRBC: 0 % (ref 0.0–0.2)

## 2020-09-18 LAB — BASIC METABOLIC PANEL
Anion gap: 9 (ref 5–15)
BUN: 18 mg/dL (ref 8–23)
CO2: 26 mmol/L (ref 22–32)
Calcium: 8 mg/dL — ABNORMAL LOW (ref 8.9–10.3)
Chloride: 106 mmol/L (ref 98–111)
Creatinine, Ser: 1.45 mg/dL — ABNORMAL HIGH (ref 0.61–1.24)
GFR, Estimated: 54 mL/min — ABNORMAL LOW (ref >60)
Glucose, Bld: 93 mg/dL (ref 70–99)
Potassium: 4.3 mmol/L (ref 3.5–5.1)
Sodium: 141 mmol/L (ref 135–145)

## 2020-09-18 LAB — RETICULOCYTES
Immature Retic Fract: 29.7 % — ABNORMAL HIGH (ref 2.3–15.9)
RBC.: 2.51 MIL/uL — ABNORMAL LOW (ref 4.22–5.81)
Retic Count, Absolute: 85.3 10*3/uL (ref 19.0–186.0)
Retic Ct Pct: 3.4 % — ABNORMAL HIGH (ref 0.4–3.1)

## 2020-09-18 LAB — BPAM RBC
Blood Product Expiration Date: 202202212359
Blood Product Expiration Date: 202202232359
ISSUE DATE / TIME: 202201220804
ISSUE DATE / TIME: 202201222220
Unit Type and Rh: 5100
Unit Type and Rh: 5100

## 2020-09-18 LAB — HEMOGLOBIN AND HEMATOCRIT, BLOOD
HCT: 22.8 % — ABNORMAL LOW (ref 39.0–52.0)
Hemoglobin: 7.1 g/dL — ABNORMAL LOW (ref 13.0–17.0)

## 2020-09-18 LAB — HAPTOGLOBIN: Haptoglobin: 365 mg/dL — ABNORMAL HIGH (ref 32–363)

## 2020-09-18 LAB — FERRITIN: Ferritin: 625 ng/mL — ABNORMAL HIGH (ref 24–336)

## 2020-09-18 MED ORDER — SPIRONOLACTONE 25 MG PO TABS
25.0000 mg | ORAL_TABLET | Freq: Every day | ORAL | Status: DC
  Administered 2020-09-18 – 2020-09-19 (×2): 25 mg via ORAL
  Filled 2020-09-18 (×2): qty 1

## 2020-09-18 MED ORDER — APIXABAN 5 MG PO TABS
5.0000 mg | ORAL_TABLET | Freq: Two times a day (BID) | ORAL | Status: DC
  Administered 2020-09-18 – 2020-09-19 (×3): 5 mg via ORAL
  Filled 2020-09-18 (×3): qty 1

## 2020-09-18 NOTE — Progress Notes (Signed)
Patient ID: Connor [name], male   DOB: [birth year], 65 y.o.   MRN: [id number] CT scan of left thigh noted. Dr. Lajoyce Corners has seen and will continue to follow.

## 2020-09-18 NOTE — Consult Note (Signed)
[place] CONSULT NOTE  Patient Care Team: Patient, No Pcp Per as PCP - General (General Practice) Donato Heinz, MD as PCP - Cardiology (Cardiology)  CHIEF COMPLAINTS/PURPOSE OF CONSULTATION:  Severe normocytic anemia  HISTORY OF PRESENTING ILLNESS:  Connor [name] 65 y.o. male is admitted to the hospital on 08/30/2020 with fever and pain in the left leg.  Over the course of the past 19 days he was treated for cellulitis, later was diagnosed with necrotizing fasciitis and subsequently undergone left below-knee amputation.  Over the course of the past 2 weeks he has required multiple units of blood transfusion.  There is a hematoma at the surgical site but it has remained stable according to Dr. Sharol Given.  His anticoagulation for atrial fibrillation was held because of persistent anemia.  Previous work-up for anemia did not reveal a clear-cut etiology.  His reticulocyte count was reasonable and he there was no evidence of hemolysis and his iron studies and Y04 and folic acid were not very abnormal.  However this work-up was done over 10 days ago.  I reviewed her records extensively and collaborated the history with the patient.   MEDICAL HISTORY:  Past Medical History:  Diagnosis Date  . AAA (abdominal aortic aneurysm) ([place])   . History of open heart surgery   . Hypertension   . Seroma due to trauma [facility])     SURGICAL HISTORY: Past Surgical History:  Procedure Laterality Date  . ABDOMINAL AORTIC ANEURYSM REPAIR    . AMPUTATION Left 09/07/2020   Procedure: ATTEMPTED LEFT LEG DEBRIDEMENT FASCIOTOMIES, APPLY INSTILLATION WOUND VAC, ABOVE KNEE AMPUTATION;  Surgeon: Newt Minion, MD;  Location: [facility];  Service: Orthopedics;  Laterality: Left;  . BUBBLE STUDY  09/05/2020   Procedure: BUBBLE STUDY;  Surgeon: Freada Bergeron, MD;  Location: [facility];  Service: Cardiovascular;;  . CARDIOVERSION N/A 02/11/2020   Procedure: CARDIOVERSION;  Surgeon: Donato Heinz, MD;  Location[facility] ENDOSCOPY;  Service: Cardiovascular;  Laterality: N/A;  . CORONARY ARTERY BYPASS GRAFT    . STUMP REVISION Left 09/09/2020   Procedure: REVISION LEFT ABOVE KNEE AMPUTATION;  Surgeon: Newt Minion, MD;  Location: [facility];  Service: Orthopedics;  Laterality: Left;  . TEE WITHOUT CARDIOVERSION N/A 09/05/2020   Procedure: TRANSESOPHAGEAL ECHOCARDIOGRAM (TEE);  Surgeon: Freada Bergeron, MD;  Location: [facility] ENDOSCOPY;  Service: Cardiovascular;  Laterality: N/A;    SOCIAL HISTORY: Social History   Socioeconomic History  . Marital status: Single    Spouse name: Not on file  . Number of children: Not on file  . Years of education: Not on file  . Highest education level: Not on file  Occupational History  . Not on file  Tobacco Use  . Smoking status: Current Every Day Smoker    Packs/day: 0.50  . Smokeless tobacco: Never Used  Vaping Use  . Vaping Use: Never used  Substance and Sexual Activity  . Alcohol use: Never  . Drug use: Never  . Sexual activity: Not on file  Other Topics Concern  . Not on file  Social History Narrative  . Not on file   Social Determinants of Health   Financial Resource Strain: Not on file  Food Insecurity: Not on file  Transportation Needs: Not on file  Physical Activity: Not on file  Stress: Not on file  Social Connections: Not on file  Intimate Partner Violence: Not on file    FAMILY HISTORY: Family History  Problem Relation Age of Onset  .  CAD Mother   . CVA Father     ALLERGIES:  has No Known Allergies.  MEDICATIONS:  Current Facility-Administered Medications  Medication Dose Route Frequency Provider Last Rate Last Admin  . aspirin EC tablet 81 mg  81 mg Oral Daily Persons, Bevely Palmer, [place]   81 mg at 09/17/20 3428  . diclofenac Sodium (VOLTAREN) 1 % topical gel 2 g  2 g Topical QID Debbe Odea, MD   2 g at 09/17/20 2138  . diltiazem (CARDIZEM CD) 24 hr capsule 240 mg  240 mg Oral Daily Barrett, Rhonda G,  PA-C   240 mg at 09/17/20 0820  . feeding supplement (ENSURE ENLIVE / ENSURE PLUS) liquid 237 mL  237 mL Oral BID BM Persons, Bevely Palmer, PA   237 mL at 09/17/20 1310  . gabapentin (NEURONTIN) capsule 400 mg  400 mg Oral TID Debbe Odea, MD   400 mg at 09/17/20 2053  . hydrALAZINE (APRESOLINE) injection 10 mg  10 mg Intravenous Q6H PRN Persons, Bevely Palmer, PA   10 mg at 09/09/20 1250  . HYDROmorphone (DILAUDID) injection 0.5 mg  0.5 mg Intravenous Q2H PRN Debbe Odea, MD   0.5 mg at 09/18/20 0145  . labetalol (NORMODYNE) tablet 400 mg  400 mg Oral BID Barrett, Evelene Croon, PA-C   400 mg at 09/17/20 2053  . MEDLINE mouth rinse  15 mL Mouth Rinse BID Persons, Bevely Palmer, PA   15 mL at 09/17/20 7681  . methocarbamol (ROBAXIN) tablet 1,000 mg  1,000 mg Oral Q6H PRN Mansy, Jan A, MD      . ondansetron [facility]) tablet 4 mg  4 mg Oral Q6H PRN Persons, Bevely Palmer, PA       Or  . ondansetron [facility]) injection 4 mg  4 mg Intravenous Q6H PRN Persons, Bevely Palmer, [place]      . oxyCODONE (Oxy IR/ROXICODONE) immediate release tablet 5-10 mg  5-10 mg Oral QID Debbe Odea, MD   10 mg at 09/17/20 2130  . oxyCODONE (OXYCONTIN) 12 hr tablet 20 mg  20 mg Oral Q12H Debbe Odea, MD   20 mg at 09/17/20 2130  . polyethylene glycol (MIRALAX / GLYCOLAX) packet 17 g  17 g Oral Daily Debbe Odea, MD   17 g at 09/17/20 0823  . sodium chloride flush (NS) 0.9 % injection 10-40 mL  10-40 mL Intracatheter Q12H Persons, Bevely Palmer, [place]   10 mL at 09/17/20 1572  . sodium chloride flush (NS) 0.9 % injection 10-40 mL  10-40 mL Intracatheter PRN Persons, Bevely Palmer, PA        REVIEW OF SYSTEMS:   Constitutional: Denies fevers, chills or abnormal night sweats Is complaining of pain at his left lower extremity at the surgical site. All other systems were reviewed with the patient and are negative.  PHYSICAL EXAMINATION: ECOG PERFORMANCE STATUS: 3 - Symptomatic, >50% confined to bed  Vitals:   09/18/20 0442 09/18/20 0918  BP: 135/76  (!) 168/97  Pulse: (!) 52 63  Resp: 20   Temp: 98 F (36.7 C)   SpO2: 94%    Filed Weights   09/08/20 0405 09/09/20 0406 09/11/20 0432  Weight: 247 lb 9.6 oz (112.3 kg) 241 lb 10 oz (109.6 kg) 251 lb 1.7 oz (113.9 kg)     LABORATORY DATA:  I have reviewed the data as listed Lab Results  Component Value Date   WBC 7.3 09/18/2020   HGB 7.2 (L) 09/18/2020   HCT 22.6 (L) 09/18/2020  MCV 95.4 09/18/2020   PLT 231 09/18/2020   Lab Results  Component Value Date   NA 141 09/18/2020   K 4.3 09/18/2020   CL 106 09/18/2020   CO2 26 09/18/2020    RADIOGRAPHIC STUDIES: I have personally reviewed the radiological reports and agreed with the findings in the report.  ASSESSMENT AND PLAN:  1.  Severe normocytic anemia: Most likely multifactorial between anemia related to multiple antibiotic treatments, anemia of inflammation, blood loss as well. 2. I would like to repeat his iron studies and reticulocyte count  to assess if his bone marrow is starting to recover.   Today's hemoglobin is 7.2.  This is after yesterday's blood transfusion. Since he did not have any previous bone marrow problems, I suspect his anemia will slowly get better with time. For the time being it is watchful monitoring only and transfusion as necessary. Will review the lab work and make further recommendations. There is no indication for bone marrow biopsy at this time since this is all acute in onset related to his current illness.  All questions were answered. The patient knows to call the clinic with any problems, questions or concerns.    Harriette Ohara, MD @T @

## 2020-09-18 NOTE — Progress Notes (Signed)
Orthopedic Tech Progress Note Patient Details:  Connor [name] 04/30/1956 [id number] Ordered prosthetic shrinker for patient Patient ID: Connor [name], male   DOB: November 30, 1955, 65 y.o.   MRN: [id number]   [name] 09/18/2020, 11:15 AM

## 2020-09-18 NOTE — Progress Notes (Signed)
Patient ID: Connor [name], male   DOB: [birth year], 65 y.o.   MRN: [id number] Patient is seen in follow-up status post left above-knee amputation for necrotizing fasciitis.  Patient has had prolonged anemia since surgery with most recent transfusion.  Hemoglobin has increased from 6.1 to 7.2 after most recent transfusion.  Patient's white cell count continues to drop and is currently 7.3.  Examination patient does have swelling of the left lower extremity the incision is healing well there is no tenderness to palpation no signs of infection.  CT scan shows a large hematoma most likely secondary to his anticoagulation therapy.  Patient also has pitting edema of the right lower extremity there are no heel ulcers on the right heel.  I will order a compression sock for the right lower extremity and a stump shrinker for the left above-the-knee amputation.   Patient's albumin is 2.0 he is on a feeding supplement reviewed the importance of increasing his protein intake.  Patient states his appetite is returning.

## 2020-09-18 NOTE — Progress Notes (Signed)
PROGRESS NOTE    Connor [name]   DPO:[id number]  DOB: 1955-10-05  DOA: 08/30/2020 PCP: Patient, No Pcp Per   Brief Narrative:  Connor [name] is a 65 y/o male with HTN, CKD 3, A-fib on Coumadin AAA, ascending aortic dissection s/p open rapair 2016 and redo on 2019, left command and external iliac stents 2020 complicated by cardiogenic shock and AKI requiring dialysis.   He presented to the ED with sepsis and rhabdomyolysis due to left leg cellulitis.  In ED > WBC 15, Lactic acid 2, CPK 28,000 and Cr of 4.2 (baseilne ~ 1.2- 1.4).  Hlater grew out strep group G in the blood. He improved overall, however, his leg did not seem to improved. On 1/12, he was ultimately taken for an AKA due to ongoing necrotizing fascitis of the leg.  His hospital course has been complicated by recurrent anemia requiring blood transfusions.    Subjective: He has no new complaints. Pain is relatively controlled until he works with PT when it flares. He is beginning to eat better and is not constipated from the narcotics.   Assessment & Plan:   Principal Problem: Severe Sepsis with acute renal failure without septic shock    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 - now s/p AKA of left leg - cont Wound vac per general surgery  - continue either Ceftriaxone (while in hospital) or Amoxil (if discharged) through 1/21 (today) - pain control remains an issue- he states he woke up in pain and it has yet to improve to a comfortable level- add Oyycontin 20 mg BID, Oxycodone > 5 mg QID routine, cont PRN IV Dilaudid- increased Nerontin from 300 mg TID to 400 mg today- follow for sedation - I have increased his narcotics to a point where he is using little to no IV Dilaudid. He is functional at current doses without exhibiting signs of being oversedated or confused.   Active Problems:  Right leg pain - due to overuse in relation to left leg injuries/ infection - use Voltaren gel PRN with heating  pad  A-fib, paroxysmal - has been on Heparin due to the procedure- transitioned back to Eliquis  - cont Cardizem and Labetalol  - Nifedipine on hold  Anemia due to acute blood loss - I have reviewed has last anemia panel / folic acid a    Component Value Date/Time   IRON 79 09/06/2020 0441   TIBC 202 (L) 09/06/2020 0441   FERRITIN 1,090 (H) 09/06/2020 0441   IRONPCTSAT 39 09/06/2020 0441   - he has received 7 Unit of PRBC- we are closely following Hb  - Hb dropped to 6.5 on 1/23- stopped Eliquis and obtained CT of leg and a retroperitoneal CT - leg CT did show a hematoma which is quite later however, ortho feel this is subacute and not acute and feels he is no longer bleeding - post transfusion Hb is 7.2 today- I am assuming that his Hb drop is partly due to bone marrow superssion in relation to acute infection and stress in addition to acute blood loss - I did ask hematology to look in at the patient today to be sure I am not missing a hematological cause for his anemia- Dr Pamelia Hoit feels that his hemoglobin should hopefully start to pick up soon - I will resume Eliquis today     AKI on CKD 3a - Cr rose from 4.21 to 7.61 on 17 and subsequently has improved to about 1.6 - baseline Cr is 1.2-1.5 -  cause was likely sepsis complicated by a prerenal state (vomiting prior to admission) and rhabdomyolysis  Grade 2 diastolic CHF - compensated - Spironolactone on hold (BP has been low at times) - following volume status    Nausea vomiting and diarrhea - on admission- has resolved      Morbid obesity (HCC)  Body mass index is 35.02 kg/m.      Time spent in minutes: 35 DVT prophylaxis: Eliquis Code Status: Full code Family Communication:  Disposition Plan:  Status is: Inpatient  Remains inpatient appropriate because:Inpatient level of care appropriate due to severity of illness   Dispo: The patient is from: Home              Anticipated d/c is to: CIR              Anticipated  d/c date is: > 3 days              Patient currently is not medically stable to d/c.  Consultants:   Ortho  Cardiology  Nephrology  General surgery   ID Procedures:   Revision of left knee amputation Antimicrobials:  Anti-infectives (From admission, onward)   Start     Dose/Rate Route Frequency Ordered Stop   09/09/20 0600  ceFAZolin (ANCEF) IVPB 2g/100 mL premix        2 g 200 mL/hr over 30 Minutes Intravenous To Short Stay 09/09/20 0213 09/09/20 1103   09/08/20 1000  linezolid (ZYVOX) IVPB 600 mg  Status:  Discontinued        600 mg 300 mL/hr over 60 Minutes Intravenous Every 12 hours 09/08/20 0838 09/11/20 1104   09/07/20 1015  ceFAZolin (ANCEF) 3 g in dextrose 5 % 50 mL IVPB  Status:  Discontinued        3 g 100 mL/hr over 30 Minutes Intravenous On call to O.R. 09/07/20 1009 09/07/20 1043   09/07/20 0600  ceFAZolin (ANCEF) IVPB 2g/100 mL premix        2 g 200 mL/hr over 30 Minutes Intravenous On call to O.R. 09/06/20 1651 09/07/20 0855   09/02/20 2200  cefTRIAXone (ROCEPHIN) 2 g in sodium chloride 0.9 % 100 mL IVPB        2 g 200 mL/hr over 30 Minutes Intravenous Every 24 hours 09/02/20 1044 09/16/20 2157   09/01/20 2200  vancomycin (VANCOREADY) IVPB 1500 mg/300 mL  Status:  Discontinued        1,500 mg 150 mL/hr over 120 Minutes Intravenous Every 48 hours 08/31/20 0350 08/31/20 1719   09/01/20 0130  ceFEPIme (MAXIPIME) 2 g in sodium chloride 0.9 % 100 mL IVPB  Status:  Discontinued        2 g 200 mL/hr over 30 Minutes Intravenous Every 24 hours 08/31/20 1812 09/02/20 1043   09/01/20 0100  metroNIDAZOLE (FLAGYL) IVPB 500 mg  Status:  Discontinued        500 mg 100 mL/hr over 60 Minutes Intravenous Every 8 hours 08/31/20 1751 08/31/20 1926   08/31/20 2200  ceFEPIme (MAXIPIME) 2 g in sodium chloride 0.9 % 100 mL IVPB  Status:  Discontinued        2 g 200 mL/hr over 30 Minutes Intravenous Every 24 hours 08/31/20 0350 08/31/20 1719   08/31/20 2030  vancomycin  (VANCOREADY) IVPB 1500 mg/300 mL        1,500 mg 150 mL/hr over 120 Minutes Intravenous  Once 08/31/20 2016 08/31/20 2338   08/31/20 2015  clindamycin (CLEOCIN) IVPB 900 mg  Status:  Discontinued        900 mg 100 mL/hr over 30 Minutes Intravenous Every 8 hours 08/31/20 1926 09/01/20 1033   08/31/20 1812  vancomycin variable dose per unstable renal function (pharmacist dosing)  Status:  Discontinued         Does not apply See admin instructions 08/31/20 1812 09/01/20 1033   08/31/20 1730  cefTRIAXone (ROCEPHIN) 2 g in sodium chloride 0.9 % 100 mL IVPB  Status:  Discontinued        2 g 200 mL/hr over 30 Minutes Intravenous Every 24 hours 08/31/20 1720 08/31/20 1751   08/31/20 1015  metroNIDAZOLE (FLAGYL) IVPB 500 mg  Status:  Discontinued        500 mg 100 mL/hr over 60 Minutes Intravenous Every 8 hours 08/31/20 1012 08/31/20 1719   08/31/20 0045  ceFEPIme (MAXIPIME) 2 g in sodium chloride 0.9 % 100 mL IVPB        2 g 200 mL/hr over 30 Minutes Intravenous  Once 08/31/20 0030 08/31/20 0208   08/31/20 0000  vancomycin (VANCOREADY) IVPB 1500 mg/300 mL        1,500 mg 150 mL/hr over 120 Minutes Intravenous  Once 08/30/20 2351 08/31/20 0208   08/30/20 1900  cefTRIAXone (ROCEPHIN) 2 g in sodium chloride 0.9 % 100 mL IVPB  Status:  Discontinued        2 g 200 mL/hr over 30 Minutes Intravenous Every 24 hours 08/30/20 1855 08/31/20 0030       Objective: Vitals:   09/17/20 2351 09/18/20 0131 09/18/20 0442 09/18/20 0918  BP: 118/70 137/71 135/76 (!) 168/97  Pulse: (!) 59 (!) 51 (!) 52 63  Resp: 18 17 20    Temp: 98.7 F (37.1 C) 98.2 F (36.8 C) 98 F (36.7 C)   TempSrc: Oral Oral Oral   SpO2: 92% 95% 94%   Weight:      Height:        Intake/Output Summary (Last 24 hours) at 09/18/2020 1118 Last data filed at 09/18/2020 0300 Gross per 24 hour  Intake 807 ml  Output -  Net 807 ml   Filed Weights   09/08/20 0405 09/09/20 0406 09/11/20 0432  Weight: 112.3 kg 109.6 kg 113.9 kg     Examination: General exam: Appears comfortable  HEENT: PERRLA, oral mucosa moist, no sclera icterus or thrush Respiratory system: Clear to auscultation. Respiratory effort normal. Cardiovascular system: S1 & S2 heard, regular rate and rhythm Gastrointestinal system: Abdomen soft, non-tender, nondistended. Normal bowel sounds   Central nervous system: Alert and oriented. No focal neurological deficits. Extremities: No cyanosis, clubbing - swelling of b/l lower extermities Skin: No rashes or ulcers Psychiatry:  Mood & affect appropriate.   Data Reviewed: I have personally reviewed following labs and imaging studies  CBC: Recent Labs  Lab 09/12/20 0209 09/13/20 0352 09/14/20 0404 09/16/20 1132 09/16/20 1427 09/17/20 1335 09/17/20 2022 09/18/20 0434 09/18/20 0837  WBC 12.7* 14.7* 14.9* 8.1 8.3  --   --   --  7.3  NEUTROABS 8.9* 11.5* 11.3*  --   --   --   --   --   --   HGB 8.0* 8.1* 7.2* 6.2* 6.1* 6.5* 6.5* 7.1* 7.2*  HCT 26.5* 25.2* 22.7* 20.6* 19.6* 21.5* 20.5* 22.8* 22.6*  MCV 99.3 97.3 98.3 100.0 99.0  --   --   --  95.4  PLT 358 314 273 248 225  --   --   --  231   Basic  Metabolic Panel: Recent Labs  Lab 09/12/20 0209 09/13/20 0352 09/14/20 0404 09/16/20 1427 09/18/20 0837  NA 145 141 140 141 141  K 4.4 4.4 4.4 4.9 4.3  CL 110 107 107 106 106  CO2 26 25 23 27 26   GLUCOSE 96 103* 96 129* 93  BUN 20 21 24* 24* 18  CREATININE 1.67* 1.48* 1.70* 1.61* 1.45*  CALCIUM 7.9* 7.9* 7.8* 8.1* 8.0*  MG 1.6* 1.7 1.8  --   --   PHOS 3.3 3.5 3.7  --   --    GFR: Estimated Creatinine Clearance: 66.9 mL/min (A) (by C-G formula based on SCr of 1.45 mg/dL (H)). Liver Function Tests: Recent Labs  Lab 09/12/20 0209 09/13/20 0352 09/14/20 0404 09/16/20 1427  AST 22 22 21  35  ALT 18 17 15 25   ALKPHOS 58 63 63 124  BILITOT 0.8 0.8 1.1 1.0  PROT 6.5 6.7 5.9* 6.4*  ALBUMIN 2.3* 2.4* 2.2* 2.0*   No results for input(s): LIPASE, AMYLASE in the last 168 hours. No results  for input(s): AMMONIA in the last 168 hours. Coagulation Profile: No results for input(s): INR, PROTIME in the last 168 hours. Cardiac Enzymes: Recent Labs  Lab 09/12/20 0209 09/13/20 0352 09/14/20 0404 09/15/20 0258  CKTOTAL 363 307 238 202   BNP (last 3 results) No results for input(s): PROBNP in the last 8760 hours. HbA1C: No results for input(s): HGBA1C in the last 72 hours. CBG: No results for input(s): GLUCAP in the last 168 hours. Lipid Profile: No results for input(s): CHOL, HDL, LDLCALC, TRIG, CHOLHDL, LDLDIRECT in the last 72 hours. Thyroid Function Tests: No results for input(s): TSH, T4TOTAL, FREET4, T3FREE, THYROIDAB in the last 72 hours. Anemia Panel: No results for input(s): VITAMINB12, FOLATE, FERRITIN, TIBC, IRON, RETICCTPCT in the last 72 hours. Urine analysis:    Component Value Date/Time   COLORURINE AMBER (A) 08/31/2020 0130   APPEARANCEUR CLOUDY (A) 08/31/2020 0130   LABSPEC 1.017 08/31/2020 0130   PHURINE 5.0 08/31/2020 0130   GLUCOSEU NEGATIVE 08/31/2020 0130   HGBUR LARGE (A) 08/31/2020 0130   BILIRUBINUR NEGATIVE 08/31/2020 0130   KETONESUR NEGATIVE 08/31/2020 0130   PROTEINUR 100 (A) 08/31/2020 0130   NITRITE NEGATIVE 08/31/2020 0130   LEUKOCYTESUR NEGATIVE 08/31/2020 0130   Sepsis Labs: @LABRCNTIP (procalcitonin:4,lacticidven:4) ) Recent Results (from the past 240 hour(s))  Surgical pcr screen     Status: None   Collection Time: 09/09/20  4:22 AM   Specimen: Nasal Mucosa; Nasal Swab  Result Value Ref Range Status   MRSA, PCR NEGATIVE NEGATIVE Final   Staphylococcus aureus NEGATIVE NEGATIVE Final    Comment: (NOTE) The Xpert SA Assay (FDA approved for NASAL specimens in patients 64 years of age and older), is one component of a comprehensive surveillance program. It is not intended to diagnose infection nor to guide or monitor treatment. Performed at [facility], [address] [place], [place] [address]           Radiology Studies: CT EXTREMITY LOWER LEFT WO CONTRAST  Result Date: 09/17/2020 CLINICAL DATA:  Sepsis.  Soft tissue infection suspected EXAM: CT OF THE LOWER LEFT EXTREMITY WITHOUT CONTRAST CT OF THE LOWER RIGHT EXTREMITY WITHOUT CONTRAST TECHNIQUE: Multidetector CT imaging of the lower left extremity and lower right extremity was performed according to the standard protocol. 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 No erosion or periostitis at the resection margin to suggest osteomyelitis. No  acute fracture or malalignment. No evidence of femoral head avascular necrosis. Ligaments Suboptimally assessed by CT. Muscles and Tendons Postsurgical changes to the left thigh musculature. No acute tendinous injury. Soft tissues There is an ill-defined mixed density fluid collection within the soft tissues at the distal stump laterally. There are mixed areas of high attenuation fluid with areas of more decreased attenuation as well as multiple locules of soft tissue gas. Approximate measurements of the collection are 10.3 cm AP x 5.3 cm TR x 12.4 cm CC (series 6, image 266; series 9, image 103). Collection approximates the surgical incision site as evident by overlying skin staples. No foci of gas within the soft tissues tracking beyond the site of fluid collection. Subcutaneous edema within the left thigh most pronounced anteriorly at the level of the hip. No deep fascial fluid collection. Multiple mildly enlarged left inguinal lymph nodes are again seen. Subcutaneous edema throughout the right lower extremity most pronounced along the lateral aspect of the knee and calf. No organized fluid collection. No soft tissue gas within the right lower extremity. No deep fascial fluid collections are evident. For findings within the pelvis. Please see concurrently obtained dedicated CT of the chest, abdomen, and pelvis.  IMPRESSION: 1. Ill-defined mixed density fluid collection within the soft tissues at the distal stump laterally measuring approximately 12.4 x 5.3 x 10.3 cm. Findings are suggestive of hematoma. There are multiple foci of air within the collection, which may be related to recent surgery. Superimposed infected collection not excluded. 2. No evidence of osteomyelitis by CT. 3. Nonspecific subcutaneous edema within the left thigh and throughout the right lower extremity, which may reflect cellulitis. No additional organized fluid collection. 4. Multiple mildly enlarged left inguinal lymph nodes, likely reactive. 5. For findings within the pelvis. Please see concurrently obtained dedicated CT of the chest, abdomen, and pelvis. Electronically Signed   By: Duanne Guess D.O.   On: 09/17/2020 13:26   CT EXTREMITY LOWER RIGHT WO CONTRAST  Result Date: 09/17/2020 CLINICAL DATA:  Sepsis.  Soft tissue infection suspected EXAM: CT OF THE LOWER LEFT EXTREMITY WITHOUT CONTRAST CT OF THE LOWER RIGHT EXTREMITY WITHOUT CONTRAST TECHNIQUE: Multidetector CT imaging of the lower left extremity and lower right extremity was performed according to the standard protocol. 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 No erosion or periostitis at the resection margin to suggest osteomyelitis. No acute fracture or malalignment. No evidence of femoral head avascular necrosis. Ligaments Suboptimally assessed by CT. Muscles and Tendons Postsurgical changes to the left thigh musculature. No acute tendinous injury. Soft tissues There is an ill-defined mixed density fluid collection within the soft tissues at the distal stump laterally. There are mixed areas of high attenuation fluid with areas of more decreased attenuation as well as multiple locules of soft tissue gas. Approximate measurements of the collection are 10.3 cm AP x 5.3 cm TR x 12.4 cm CC  (series 6, image 266; series 9, image 103). Collection approximates the surgical incision site as evident by overlying skin staples. No foci of gas within the soft tissues tracking beyond the site of fluid collection. Subcutaneous edema within the left thigh most pronounced anteriorly at the level of the hip. No deep fascial fluid collection. Multiple mildly enlarged left inguinal lymph nodes are again seen. Subcutaneous edema throughout the right lower extremity most pronounced along the lateral aspect of the knee and calf. No organized fluid collection. No soft tissue  gas within the right lower extremity. No deep fascial fluid collections are evident. For findings within the pelvis. Please see concurrently obtained dedicated CT of the chest, abdomen, and pelvis. IMPRESSION: 1. Ill-defined mixed density fluid collection within the soft tissues at the distal stump laterally measuring approximately 12.4 x 5.3 x 10.3 cm. Findings are suggestive of hematoma. There are multiple foci of air within the collection, which may be related to recent surgery. Superimposed infected collection not excluded. 2. No evidence of osteomyelitis by CT. 3. Nonspecific subcutaneous edema within the left thigh and throughout the right lower extremity, which may reflect cellulitis. No additional organized fluid collection. 4. Multiple mildly enlarged left inguinal lymph nodes, likely reactive. 5. For findings within the pelvis. Please see concurrently obtained dedicated CT of the chest, abdomen, and pelvis. Electronically Signed   By: Duanne Guess D.O.   On: 09/17/2020 13:26   CT CHEST ABDOMEN PELVIS WO CONTRAST  Result Date: 09/17/2020 CLINICAL DATA:  Suspected retroperitoneal hematoma . Anemia. Severe sepsis and acute renal failure. Necrotizing fasciitis. 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 EXAM: CT CHEST, ABDOMEN AND PELVIS WITHOUT CONTRAST TECHNIQUE: Multidetector CT imaging of the chest, abdomen and pelvis was  performed following the standard protocol without IV contrast. COMPARISON:  09/01/2020 abdominal ultrasound. 03/01/2020 CTA of the chest, abdomen, and pelvis. FINDINGS: CT CHEST FINDINGS Cardiovascular: Mild motion degradation. Status post ascending aortic repair and transverse aortic stent graft. The descending thoracic aorta measures on the order of 5.3 cm on 39/3, similar to on the prior. The dissection is not well evaluated on this noncontrast exam. Tortuous descending thoracic aorta. Moderate cardiomegaly, without pericardial effusion. Lad coronary artery calcification. Pulmonary artery enlargement, outflow tract 3.5 cm. Mediastinum/Nodes: Right chest wall surgical clips. Right paratracheal node of 1.7 cm on 27/3, similar. Hilar regions poorly evaluated without intravenous contrast. Lungs/Pleura: Trace bilateral pleural fluid, new since prior CT. Left base scarring or subsegmental atelectasis. Posterior left upper lobe dependent atelectasis as well. Musculoskeletal: Marked bilateral gynecomastia. Prior median sternotomy. No acute osseous abnormality. CT ABDOMEN PELVIS FINDINGS Hepatobiliary: Hepatomegaly at 21.4 cm craniocaudal. Dependent gallstones. No acute cholecystitis or biliary duct dilatation. Pancreas: Normal, without mass or ductal dilatation. Spleen: Normal in size, without focal abnormality. Adrenals/Urinary Tract: Normal adrenal glands. No renal calculi or hydronephrosis. No hydroureter or ureteric calculi. No bladder calculi. Stomach/Bowel: Normal stomach, without wall thickening. Colonic stool burden suggests constipation. Normal terminal ileum and appendix. Normal small bowel. Vascular/Lymphatic: Left renal artery stent. Left common and external iliac artery stents. No pelvic or abdominal retroperitoneal hemorrhage/hematoma. No abdominopelvic adenopathy. Reproductive: Normal prostate. Other: No significant free fluid. No free intraperitoneal air. Extensive subcutaneous edema throughout the  proximal left lower extremity. Intramuscular edema and fluid are incompletely imaged, including on 136/3. Musculoskeletal: No acute osseous abnormality. IMPRESSION: 1. Subcutaneous and intramuscular edema/fluid throughout the imaged proximal left lower extremity. This will be more completely evaluated on dedicated extremity CT, dictated separately. 2. No pelvic or abdominal retroperitoneal hematoma identified. 3. Similar appearance of the thoracic aorta, status post ascending aortic repair and transverse aortic stent graft. 4. Aortic atherosclerosis (ICD10-I70.0) and emphysema (ICD10-J43.9). 5. Tiny bilateral pleural effusions. 6. Cholelithiasis. 7.  Possible constipation. 8. Pulmonary artery enlargement suggests pulmonary arterial hypertension. 9. Mild thoracic adenopathy, favored to be reactive. 10. Bilateral marked gynecomastia. Electronically Signed   By: Jeronimo Greaves M.D.   On: 09/17/2020 13:26      Scheduled Meds: . aspirin EC  81 mg Oral Daily  . diclofenac  Sodium  2 g Topical QID  . diltiazem  240 mg Oral Daily  . feeding supplement  237 mL Oral BID BM  . gabapentin  400 mg Oral TID  . labetalol  400 mg Oral BID  . mouth rinse  15 mL Mouth Rinse BID  . oxyCODONE  5-10 mg Oral QID  . oxyCODONE  20 mg Oral Q12H  . polyethylene glycol  17 g Oral Daily  . sodium chloride flush  10-40 mL Intracatheter Q12H   Continuous Infusions:    LOS: 19 days      Calvert Cantor, MD [facility] Pager: [email or web address] 09/18/2020, 11:18 AM

## 2020-09-19 ENCOUNTER — Inpatient Hospital Stay (HOSPITAL_COMMUNITY)
Admission: RE | Admit: 2020-09-19 | Discharge: 2020-10-01 | DRG: 560 | Disposition: A | Discharge status: Home / Self Care | Payer: Medicaid Other | Source: Intra-hospital | Attending: Physical Medicine and Rehabilitation | Admitting: Physical Medicine and Rehabilitation

## 2020-09-19 ENCOUNTER — Other Ambulatory Visit: Payer: Self-pay

## 2020-09-19 ENCOUNTER — Encounter (HOSPITAL_COMMUNITY): Payer: Self-pay | Admitting: Physical Medicine and Rehabilitation

## 2020-09-19 DIAGNOSIS — M6282 Rhabdomyolysis: Secondary | ICD-10-CM | POA: Diagnosis present

## 2020-09-19 DIAGNOSIS — Z7901 Long term (current) use of anticoagulants: Secondary | ICD-10-CM

## 2020-09-19 DIAGNOSIS — Z6838 Body mass index (BMI) 38.0-38.9, adult: Secondary | ICD-10-CM

## 2020-09-19 DIAGNOSIS — N182 Chronic kidney disease, stage 2 (mild): Secondary | ICD-10-CM | POA: Diagnosis present

## 2020-09-19 DIAGNOSIS — R079 Chest pain, unspecified: Secondary | ICD-10-CM | POA: Diagnosis not present

## 2020-09-19 DIAGNOSIS — D631 Anemia in chronic kidney disease: Secondary | ICD-10-CM | POA: Diagnosis present

## 2020-09-19 DIAGNOSIS — Z89612 Acquired absence of left leg above knee: Secondary | ICD-10-CM | POA: Diagnosis not present

## 2020-09-19 DIAGNOSIS — N179 Acute kidney failure, unspecified: Secondary | ICD-10-CM | POA: Diagnosis present

## 2020-09-19 DIAGNOSIS — I129 Hypertensive chronic kidney disease with stage 1 through stage 4 chronic kidney disease, or unspecified chronic kidney disease: Secondary | ICD-10-CM | POA: Diagnosis present

## 2020-09-19 DIAGNOSIS — Z951 Presence of aortocoronary bypass graft: Secondary | ICD-10-CM

## 2020-09-19 DIAGNOSIS — I4819 Other persistent atrial fibrillation: Secondary | ICD-10-CM | POA: Diagnosis present

## 2020-09-19 DIAGNOSIS — G546 Phantom limb syndrome with pain: Secondary | ICD-10-CM | POA: Diagnosis present

## 2020-09-19 DIAGNOSIS — A409 Streptococcal sepsis, unspecified: Secondary | ICD-10-CM | POA: Diagnosis not present

## 2020-09-19 DIAGNOSIS — I10 Essential (primary) hypertension: Secondary | ICD-10-CM | POA: Diagnosis not present

## 2020-09-19 DIAGNOSIS — D62 Acute posthemorrhagic anemia: Secondary | ICD-10-CM | POA: Diagnosis not present

## 2020-09-19 DIAGNOSIS — F17210 Nicotine dependence, cigarettes, uncomplicated: Secondary | ICD-10-CM | POA: Diagnosis present

## 2020-09-19 DIAGNOSIS — Z4781 Encounter for orthopedic aftercare following surgical amputation: Secondary | ICD-10-CM | POA: Diagnosis present

## 2020-09-19 DIAGNOSIS — A419 Sepsis, unspecified organism: Secondary | ICD-10-CM | POA: Diagnosis not present

## 2020-09-19 DIAGNOSIS — L03116 Cellulitis of left lower limb: Secondary | ICD-10-CM | POA: Diagnosis not present

## 2020-09-19 DIAGNOSIS — R0789 Other chest pain: Secondary | ICD-10-CM | POA: Diagnosis not present

## 2020-09-19 DIAGNOSIS — Z8679 Personal history of other diseases of the circulatory system: Secondary | ICD-10-CM | POA: Diagnosis not present

## 2020-09-19 DIAGNOSIS — N183 Chronic kidney disease, stage 3 unspecified: Secondary | ICD-10-CM | POA: Diagnosis not present

## 2020-09-19 DIAGNOSIS — Z79899 Other long term (current) drug therapy: Secondary | ICD-10-CM

## 2020-09-19 DIAGNOSIS — I2510 Atherosclerotic heart disease of native coronary artery without angina pectoris: Secondary | ICD-10-CM | POA: Diagnosis present

## 2020-09-19 DIAGNOSIS — R7989 Other specified abnormal findings of blood chemistry: Secondary | ICD-10-CM

## 2020-09-19 DIAGNOSIS — Z7982 Long term (current) use of aspirin: Secondary | ICD-10-CM

## 2020-09-19 DIAGNOSIS — E669 Obesity, unspecified: Secondary | ICD-10-CM | POA: Diagnosis present

## 2020-09-19 DIAGNOSIS — G8918 Other acute postprocedural pain: Secondary | ICD-10-CM

## 2020-09-19 DIAGNOSIS — D649 Anemia, unspecified: Secondary | ICD-10-CM

## 2020-09-19 MED ORDER — DILTIAZEM HCL ER COATED BEADS 240 MG PO CP24: 240.0000 mg | ORAL_CAPSULE | Freq: Every day | ORAL | Status: DC

## 2020-09-19 MED ORDER — LABETALOL HCL 100 MG PO TABS
400.0000 mg | ORAL_TABLET | Freq: Two times a day (BID) | ORAL | Status: DC
  Administered 2020-09-19 – 2020-10-01 (×24): 400 mg via ORAL
  Filled 2020-09-19 (×25): qty 4

## 2020-09-19 MED ORDER — GABAPENTIN 400 MG PO CAPS
400.0000 mg | ORAL_CAPSULE | Freq: Three times a day (TID) | ORAL | Status: DC
  Administered 2020-09-19 – 2020-10-01 (×35): 400 mg via ORAL
  Filled 2020-09-19 (×35): qty 1

## 2020-09-19 MED ORDER — ONDANSETRON HCL 4 MG PO TABS
4.0000 mg | ORAL_TABLET | Freq: Four times a day (QID) | ORAL | Status: DC | PRN
  Administered 2020-09-24: 4 mg via ORAL
  Filled 2020-09-19: qty 1

## 2020-09-19 MED ORDER — POLYETHYLENE GLYCOL 3350 17 G PO PACK: 17.0000 g | PACK | Freq: Every day | ORAL | 0 refills | Status: DC

## 2020-09-19 MED ORDER — DICLOFENAC SODIUM 1 % EX GEL: 2.0000 g | Freq: Four times a day (QID) | CUTANEOUS | Status: DC

## 2020-09-19 MED ORDER — METHOCARBAMOL 500 MG PO TABS: 1000.0000 mg | ORAL_TABLET | Freq: Four times a day (QID) | ORAL | Status: DC | PRN

## 2020-09-19 MED ORDER — OXYCODONE HCL ER 20 MG PO T12A: 20.0000 mg | EXTENDED_RELEASE_TABLET | Freq: Two times a day (BID) | ORAL | 0 refills | Status: DC

## 2020-09-19 MED ORDER — SPIRONOLACTONE 25 MG PO TABS
25.0000 mg | ORAL_TABLET | Freq: Every day | ORAL | Status: DC
  Administered 2020-09-20 – 2020-10-01 (×12): 25 mg via ORAL
  Filled 2020-09-19 (×12): qty 1

## 2020-09-19 MED ORDER — DILTIAZEM HCL ER COATED BEADS 120 MG PO CP24
240.0000 mg | ORAL_CAPSULE | Freq: Every day | ORAL | Status: DC
  Administered 2020-09-20 – 2020-10-01 (×12): 240 mg via ORAL
  Filled 2020-09-19 (×12): qty 2

## 2020-09-19 MED ORDER — ONDANSETRON HCL 4 MG/2ML IJ SOLN: 4.0000 mg | Freq: Four times a day (QID) | INTRAMUSCULAR | Status: DC | PRN

## 2020-09-19 MED ORDER — DICLOFENAC SODIUM 1 % EX GEL
2.0000 g | Freq: Four times a day (QID) | CUTANEOUS | Status: DC
  Administered 2020-09-19 – 2020-10-01 (×18): 2 g via TOPICAL
  Filled 2020-09-19: qty 100

## 2020-09-19 MED ORDER — OXYCODONE HCL ER 10 MG PO T12A
20.0000 mg | EXTENDED_RELEASE_TABLET | Freq: Two times a day (BID) | ORAL | Status: DC
  Administered 2020-09-19 – 2020-09-22 (×7): 20 mg via ORAL
  Filled 2020-09-19 (×7): qty 2

## 2020-09-19 MED ORDER — OXYCODONE HCL 5 MG PO TABS
5.0000 mg | ORAL_TABLET | Freq: Four times a day (QID) | ORAL | Status: DC
  Administered 2020-09-19: 5 mg via ORAL
  Administered 2020-09-19 – 2020-09-20 (×5): 10 mg via ORAL
  Filled 2020-09-19 (×6): qty 2
  Filled 2020-09-19: qty 1

## 2020-09-19 MED ORDER — GABAPENTIN 400 MG PO CAPS: 400.0000 mg | ORAL_CAPSULE | Freq: Three times a day (TID) | ORAL | Status: DC

## 2020-09-19 MED ORDER — ENSURE ENLIVE PO LIQD
237.0000 mL | Freq: Two times a day (BID) | ORAL | Status: DC
  Administered 2020-09-20 – 2020-09-28 (×16): 237 mL via ORAL
  Filled 2020-09-19: qty 237

## 2020-09-19 MED ORDER — ENSURE ENLIVE PO LIQD: 237.0000 mL | Freq: Two times a day (BID) | ORAL | 12 refills | Status: DC

## 2020-09-19 MED ORDER — OXYCODONE HCL 5 MG PO TABS: 5.0000 mg | ORAL_TABLET | Freq: Four times a day (QID) | ORAL | 0 refills | Status: DC

## 2020-09-19 MED ORDER — APIXABAN 5 MG PO TABS
5.0000 mg | ORAL_TABLET | Freq: Two times a day (BID) | ORAL | Status: DC
  Administered 2020-09-19 – 2020-10-01 (×24): 5 mg via ORAL
  Filled 2020-09-19 (×24): qty 1

## 2020-09-19 MED ORDER — METHOCARBAMOL 500 MG PO TABS
1000.0000 mg | ORAL_TABLET | Freq: Four times a day (QID) | ORAL | Status: DC | PRN
  Administered 2020-09-20 – 2020-09-26 (×5): 1000 mg via ORAL
  Filled 2020-09-19 (×7): qty 2

## 2020-09-19 MED ORDER — POLYETHYLENE GLYCOL 3350 17 G PO PACK
17.0000 g | PACK | Freq: Every day | ORAL | Status: DC
  Administered 2020-09-20: 17 g via ORAL
  Filled 2020-09-19 (×10): qty 1

## 2020-09-19 NOTE — H&P (Signed)
Physical Medicine and Rehabilitation Admission H&P        Chief Complaint  Patient presents with  . Fever  : HPI: Connor [name] is a 65 year old right-handed male with history of aortic dissection with several complicated surgery including redo thoracic aortic repair as well as sternal infection, atrial fibrillation on Eliquis, CAD with CABG, CKD stage II-3, hypertension, obesity.  Per chart review patient lives with his autistic cousin in a mobile home ramped entrance.  Independent with assistive device.  Community ambulator with straight point cane and driving.  .  Presented 08/30/2020 with persistent nausea vomiting diarrhea x5 days.  In the ED noted fever 102.7 he was tachycardic.  Chemistry showed a potassium 2.8 glucose 162 BUN 55 creatinine 4.21 from baseline of 1.38, WBC 15,600, hemoglobin 10.5, platelets 136,000, lactic acid 2.1 troponin 425-482, blood cultures GPC x2 Streptococcus urine culture E Faecalis, CK 28,083.Marland Kitchen.  Chest x-ray showed no acute intrathoracic process.  Patient noted edema and swelling of the left lower extremity.  CT of left lower extremity showed no fracture or malalignment.  No significant hip or knee effusion however there was moderate subcutaneous edema within the left lower extremity extending from the hip to the imaged proximal lower leg possibly representing cellulitis or nonspecific lower extremity edema.  No gas containing fluid collections.  Multiple enlarged left inguinal lymph nodes the largest measuring up to 3 cm.  Nephrology consulted for AKI on CKD with rhabdomyolysis and felt AKI likely secondary to acute tubular injury decreased renal perfusion related to hypotension.  Placed on gentle IV fluids and renal functions much improved with latest creatinine 1.45 and latest CK 202.Marland Kitchen.  Patient had been maintained on broad-spectrum antibiotics for suspected sepsis/cellulitis.  Orthopedic service follow-up Dr. 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  Hospital course patient required multiple transfusions for anemia.  His chronic anticoagulation was resumed.  Hematology service Dr. Pamelia HoitGudena consulted in regards to anemia felt to be multifactorial and work-up with iron studies and reticulocyte count with latest hemoglobin 7.4, platelets 217,000.  No current indications for bone marrow biopsies.  Therapy evaluations completed due to patient's decreased functional ability/multimedical he was admitted for a comprehensive rehab program   Review of Systems  Constitutional: Positive for fever. Negative for chills.  HENT: Negative for hearing loss.   Eyes: Negative for blurred vision and double vision.  Respiratory: Positive for shortness of breath. Negative for cough.   Cardiovascular: Positive for palpitations and leg swelling. Negative for chest pain.  Gastrointestinal: Positive for abdominal pain, diarrhea, nausea and vomiting.  Genitourinary: Negative for dysuria, flank pain and hematuria.  Musculoskeletal: Positive for joint pain and myalgias.  Skin: Negative for rash.  All other systems reviewed and are negative.   Past Medical History:  Diagnosis Date  . AAA (abdominal aortic aneurysm) (HCC)    . History of open heart surgery    . Hypertension    . Seroma due to trauma [facility](HCC)           Past Surgical History:  Procedure Laterality Date  . ABDOMINAL AORTIC ANEURYSM REPAIR      . AMPUTATION Left 09/07/2020    Procedure: ATTEMPTED LEFT LEG DEBRIDEMENT FASCIOTOMIES, APPLY INSTILLATION WOUND VAC, ABOVE KNEE AMPUTATION;  Surgeon: Nadara Mustarduda, Marcus V, MD;  Location: [facility] OR;  Service: Orthopedics;  Laterality: Left;  . BUBBLE STUDY   09/05/2020    Procedure: BUBBLE STUDY;  Surgeon: Meriam SpraguePemberton, Heather E, MD;  [facility];  Service: Cardiovascular;;  . CARDIOVERSION N/A 02/11/2020    Procedure: CARDIOVERSION;  Surgeon: Little IshikawaSchumann, Christopher L, MD;   [facility];  Service: Cardiovascular;  Laterality: N/A;  . CORONARY ARTERY BYPASS GRAFT      . STUMP REVISION Left 09/09/2020    Procedure: REVISION LEFT ABOVE KNEE AMPUTATION;  Surgeon: Nadara Mustarduda, Marcus V, MD;  [facility];  Service: Orthopedics;  Laterality: Left;  . TEE WITHOUT CARDIOVERSION N/A 09/05/2020    Procedure: TRANSESOPHAGEAL ECHOCARDIOGRAM (TEE);  Surgeon: Meriam SpraguePemberton, Heather E, MD;  [facility];  Service: Cardiovascular;  Laterality: N/A;         Family History  Problem Relation Age of Onset  . CAD Mother    . CVA Father      Social History:  reports that he has been smoking. He has been smoking about 0.50 packs per day. He has never used smokeless tobacco. He reports that he does not drink alcohol and does not use drugs. Allergies: No Known Allergies       Medications Prior to Admission  Medication Sig Dispense Refill  . aspirin EC 81 MG tablet Take 81 mg by mouth daily. Swallow whole.      Marland Kitchen. ELIQUIS 5 MG TABS tablet Take 1 tablet (5 mg total) by mouth 2 (two) times daily. 180 tablet 3  . labetalol (NORMODYNE) 200 MG tablet Take 2 tablets (400 mg total) by mouth 2 (two) times daily. (Patient taking differently: Take 200 mg by mouth 2 (two) times daily.) 360 tablet 3  . Multiple Vitamins-Minerals (MULTIVITAMIN WITH MINERALS) tablet Take 1 tablet by mouth daily. Men      . NIFEdipine (ADALAT CC) 90 MG 24 hr tablet TAKE 1 TABLET(90 MG) BY MOUTH DAILY (Patient taking differently: Take 90 mg by mouth daily.) 90 tablet 2  . spironolactone (ALDACTONE) 25 MG tablet TAKE 1/2 TABLET(12.5 MG) BY MOUTH DAILY (Patient taking differently: Take 25 mg by mouth daily.) 30 tablet 10  . valsartan (DIOVAN) 80 MG tablet Take 1 tablet (80 mg total) by mouth daily. 90 tablet 3      Drug Regimen Review Drug regimen was reviewed and remains appropriate with no significant issues identified   Home: Home Living Family/patient expects to be discharged to:: Private  residence Living Arrangements: Other relatives (cousin who cannot provide physical assist) Available Help at Discharge: Family,Available PRN/intermittently Type of Home: Mobile home Home Access: Ramped entrance Home Layout: One level Bathroom Shower/Tub: Engineer, manufacturing systemsTub/shower unit Bathroom Toilet: Standard Bathroom Accessibility: Yes Home Equipment: Cane - single point Additional Comments: pt states his cousin is disabled and could not provide any physical assist   Functional History: Prior Function Level of Independence: Independent with assistive device(s) 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 Transfers Overall transfer level: Needs assistance Equipment used: Rolling walker (2 wheeled) Transfers: Sit to/from Stand Sit to Stand: Min assist,+2 physical assistance,+2 safety/equipment Stand pivot transfers: Min assist,Min guard General transfer comment: MinAx1-2 and extra time to power up to stand, cuing for pt to scoot anteriorly and keep hands on recliner to push up to stand  then transition 1 hand at a time to RW once buttocks is cleared, success but poor carryover. Cued pt to reach back for chair to control descent with return to sit, min success. Ambulation/Gait Ambulation/Gait assistance: Min assist,+2 safety/equipment,+2 physical assistance Gait Distance (Feet): 12 Feet (x2 bouts of ~8 ft > ~12 ft, seated rest break between bouts) Assistive device: Rolling walker (2 wheeled) Gait Pattern/deviations: Step-to pattern,Decreased stride length,Trunk flexed ("hop to") General  Gait Details: Slow, short hopping steps, relying heavily on hands on RW during swing phase of R leg. Cued pt to monitor himself and his fatigue and to appropriately return to sit to rest prior to complete LOB due to fatigue, with success but reminders throughout. MinAx1-2 with chair follow to maintain balance and safety. Gait velocity: decr Gait velocity interpretation: <1.31 ft/sec, indicative of household ambulator   ADL: ADL Overall ADL's : Needs assistance/impaired Eating/Feeding: Set up,Sitting Grooming: Set up,Bed level,Wash/dry hands,Wash/dry face Upper Body Bathing: Maximal assistance,Bed level Lower Body Bathing: Total assistance,Bed level Upper Body Dressing : Moderate assistance,Bed level Lower Body Dressing: Total assistance,Bed level Toilet Transfer: Moderate assistance,Stand-pivot,BSC,RW Toilet Transfer Details (indicate cue type and reason): mod A to powerup, min A to control descent. Cues for sequencing and hand placement with rw. Repetition with verbal cues needed at times. Toileting- Clothing Manipulation and Hygiene: Total assistance,Bed level Toileting - Clothing Manipulation Details (indicate cue type and reason): Pt unable to properly clean self Functional mobility during ADLs: Minimal assistance,Cueing for safety,Cueing for sequencing,Rolling walker General ADL Comments: Pt completed bed mobility, managed urinal to void sitting EOB at min guard - min A level (posterior LOB, min A to correct).. took pivotal steps from EOB to recliner simulating toilet transfer to [facility]. Up to mod A needed.   Cognition: Cognition Overall Cognitive Status: No family/caregiver present to determine baseline cognitive functioning Orientation Level: Oriented X4 Cognition Arousal/Alertness: Awake/alert Behavior During Therapy: Flat affect Overall Cognitive Status: No family/caregiver present to determine baseline cognitive functioning Area of Impairment: Problem  solving,Safety/judgement Safety/Judgement: Decreased awareness of safety Awareness: Intellectual Problem Solving: Slow processing General Comments: Increased time to follow one step commands at times. Difficulty sequencing during transfers.   Physical Exam: Blood pressure (!) 152/83, pulse 61, temperature 98.7 F (37.1 C), temperature source Oral, resp. rate 18, height 5\' 11"  (1.803 m), weight 113.9 kg, SpO2 94 %. Physical Exam Skin:    Comments: Left AKA site is dressed appropriately tender  Neurological:     Comments: Patient is alert in no acute distress oriented x3 and follows commands.     General: No acute distress Mood and affect are appropriate Heart: Regular rate and rhythm no rubs murmurs or extra sounds Lungs: Clear to auscultation, breathing unlabored, no rales or wheezes Abdomen: Positive bowel sounds, soft nontender to palpation, nondistended Extremities: No clubbing, cyanosis, or edema Skin: No evidence of breakdown, no evidence of rash Neurologic: Cranial nerves II through XII intact, motor strength is 5/5 in bilateral deltoid, bicep, tricep, grip, Right hip flexor, knee extensors, ankle dorsiflexor and plantar flexor, 3 - left hip flexor, remainder not applicable due to amputation Sensory exam normal sensation to light touch  in bilateral upper and lower extremities Cerebellar exam normal finger to nose to finger as well as heel to shin in bilateral upper and lower extremities Musculoskeletal: Left above-knee amputation with Ace wrapping full range of motion in all 4 extremities. No joint swelling    Lab Results Last 48 Hours        Results for  orders placed or performed during the hospital encounter of 08/30/20 (from the past 48 hour(s))  Hemoglobin and hematocrit, blood     Status: Abnormal    Collection Time: 09/17/20  1:35 PM  Result Value Ref Range    Hemoglobin 6.5 (LL) 13.0 - 17.0 g/dL      Comment: REPEATED TO VERIFY THIS CRITICAL RESULT HAS VERIFIED AND  BEEN CALLED TO M TETTEH RN BY KYUNG BAEK ON 01 22 2022 AT 1421, AND HAS BEEN READ BACK.       HCT 21.5 (L) 39.0 - 52.0 %      Comment: Performed at [facility], [address] [place], [place] [address]  Prepare RBC (crossmatch)     Status: None    Collection Time: 09/17/20  7:58 PM  Result Value Ref Range    Order Confirmation          ORDER PROCESSED BY BLOOD BANK Performed at [facility], [address] [place], [place] [address]    Hemoglobin and hematocrit, blood     Status: Abnormal    Collection Time: 09/17/20  8:22 PM  Result Value Ref Range    Hemoglobin 6.5 (LL) 13.0 - 17.0 g/dL      Comment: REPEATED TO VERIFY CRITICAL VALUE NOTED.  VALUE IS CONSISTENT WITH PREVIOUSLY REPORTED AND CALLED VALUE.      HCT 20.5 (L) 39.0 - 52.0 %      Comment: Performed at [facility], [address] [place], [place] [address]  Hemoglobin and hematocrit, blood     Status: Abnormal    Collection Time: 09/18/20  4:34 AM  Result Value Ref Range    Hemoglobin 7.1 (L) 13.0 - 17.0 g/dL    HCT 18.2 (L) 99.3 - 52.0 %      Comment: Performed at [facility], [address], [place], [place] [address]  Basic metabolic panel     Status: Abnormal    Collection Time: 09/18/20  8:37 AM  Result Value Ref Range    Sodium 141 135 - 145 mmol/L    Potassium 4.3 3.5 - 5.1 mmol/L    Chloride 106 98 - 111 mmol/L    CO2 26 22 - 32 mmol/L    Glucose, Bld 93 70 - 99 mg/dL      Comment: Glucose reference range applies only to samples taken after fasting for at least 8 hours.    BUN 18 8 - 23 mg/dL    Creatinine, Ser 7.89 (H) 0.61 - 1.24 mg/dL    Calcium 8.0 (L) 8.9 - 10.3 mg/dL    GFR, Estimated 54 (L) >60 mL/min      Comment: (NOTE) Calculated using the CKD-EPI Creatinine Equation (2021)      Anion gap 9 5 - 15      Comment: Performed at [facility], [address] [place], [place] [address]  CBC     Status: Abnormal    Collection Time: 09/18/20  8:37 AM  Result Value  Ref Range    WBC 7.3 4.0 - 10.5 K/uL    RBC 2.37 (L) 4.22 - 5.81 MIL/uL    Hemoglobin 7.2 (L) 13.0 - 17.0 g/dL    HCT 75.1 (L) 02.5 - 52.0 %    MCV 95.4 80.0 - 100.0 fL    MCH 30.4 26.0 - 34.0 pg    MCHC 31.9 30.0 - 36.0 g/dL    RDW 85.2 (H) 77.8 - 15.5 %  Platelets 231 150 - 400 K/uL    nRBC 0.3 (H) 0.0 - 0.2 %      Comment: Performed at [facility], [address] [place], [place] [address]  CBC     Status: Abnormal    Collection Time: 09/18/20  2:12 PM  Result Value Ref Range    WBC 8.0 4.0 - 10.5 K/uL    RBC 2.51 (L) 4.22 - 5.81 MIL/uL    Hemoglobin 7.4 (L) 13.0 - 17.0 g/dL    HCT 57.8 (L) 46.9 - 52.0 %    MCV 96.4 80.0 - 100.0 fL    MCH 29.5 26.0 - 34.0 pg    MCHC 30.6 30.0 - 36.0 g/dL    RDW 62.9 (H) 52.8 - 15.5 %    Platelets 217 150 - 400 K/uL    nRBC 0.0 0.0 - 0.2 %      Comment: Performed at [facility], [address], [place], [place] [address]  Iron and TIBC     Status: Abnormal    Collection Time: 09/18/20  2:12 PM  Result Value Ref Range    Iron 32 (L) 45 - 182 ug/dL    TIBC 401 (L) 027 - 253 ug/dL    Saturation Ratios 14 (L) 17.9 - 39.5 %    UIBC 192 ug/dL      Comment: Performed at [facility], [address] [place], [place] [address]  Ferritin     Status: Abnormal    Collection Time: 09/18/20  2:12 PM  Result Value Ref Range    Ferritin 625 (H) 24 - 336 ng/mL      Comment: Performed at [facility], [address] [place], [place] [address]  Reticulocytes     Status: Abnormal    Collection Time: 09/18/20  2:12 PM  Result Value Ref Range    Retic Ct Pct 3.4 (H) 0.4 - 3.1 %    RBC. 2.51 (L) 4.22 - 5.81 MIL/uL    Retic Count, Absolute 85.3 19.0 - 186.0 K/uL    Immature Retic Fract 29.7 (H) 2.3 - 15.9 %      Comment: Performed at [facility], [address], [place], [place] [address]       Imaging Results (Last 48 hours)  CT EXTREMITY LOWER LEFT WO CONTRAST   Result Date: 09/17/2020 CLINICAL DATA:  Sepsis.   Soft tissue infection suspected EXAM: CT OF THE LOWER LEFT EXTREMITY WITHOUT CONTRAST CT OF THE LOWER RIGHT EXTREMITY WITHOUT CONTRAST TECHNIQUE: Multidetector CT imaging of the lower left extremity and lower right extremity was performed according to the standard protocol. 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 No erosion or periostitis at the resection margin to suggest osteomyelitis. No acute fracture or malalignment. No evidence of femoral head avascular necrosis. Ligaments Suboptimally assessed by CT. Muscles and Tendons Postsurgical changes to the left thigh musculature. No acute tendinous injury. Soft tissues There is an ill-defined mixed density fluid collection within the soft tissues at the distal stump laterally. There are mixed areas of high attenuation fluid with areas of more decreased attenuation as well as multiple locules of soft tissue gas. Approximate measurements of the collection are 10.3 cm AP x 5.3 cm TR x 12.4 cm CC (series 6, image 266; series 9, image 103). Collection approximates the surgical incision site as evident by overlying skin staples. No foci of gas  within the soft tissues tracking beyond the site of fluid collection. Subcutaneous edema within the left thigh most pronounced anteriorly at the level of the hip. No deep fascial fluid collection. Multiple mildly enlarged left inguinal lymph nodes are again seen. Subcutaneous edema throughout the right lower extremity most pronounced along the lateral aspect of the knee and calf. No organized fluid collection. No soft tissue gas within the right lower extremity. No deep fascial fluid collections are evident. For findings within the pelvis. Please see concurrently obtained dedicated CT of the chest, abdomen, and pelvis. IMPRESSION: 1. Ill-defined mixed density fluid collection within the soft tissues at the distal stump laterally measuring  approximately 12.4 x 5.3 x 10.3 cm. Findings are suggestive of hematoma. There are multiple foci of air within the collection, which may be related to recent surgery. Superimposed infected collection not excluded. 2. No evidence of osteomyelitis by CT. 3. Nonspecific subcutaneous edema within the left thigh and throughout the right lower extremity, which may reflect cellulitis. No additional organized fluid collection. 4. Multiple mildly enlarged left inguinal lymph nodes, likely reactive. 5. For findings within the pelvis. Please see concurrently obtained dedicated CT of the chest, abdomen, and pelvis. Electronically Signed   By: Duanne Guess D.O.   On: 09/17/2020 13:26    CT EXTREMITY LOWER RIGHT WO CONTRAST   Result Date: 09/17/2020 CLINICAL DATA:  Sepsis.  Soft tissue infection suspected EXAM: CT OF THE LOWER LEFT EXTREMITY WITHOUT CONTRAST CT OF THE LOWER RIGHT EXTREMITY WITHOUT CONTRAST TECHNIQUE: Multidetector CT imaging of the lower left extremity and lower right extremity was performed according to the standard protocol. 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 No erosion or periostitis at the resection margin to suggest osteomyelitis. No acute fracture or malalignment. No evidence of femoral head avascular necrosis. Ligaments Suboptimally assessed by CT. Muscles and Tendons Postsurgical changes to the left thigh musculature. No acute tendinous injury. Soft tissues There is an ill-defined mixed density fluid collection within the soft tissues at the distal stump laterally. There are mixed areas of high attenuation fluid with areas of more decreased attenuation as well as multiple locules of soft tissue gas. Approximate measurements of the collection are 10.3 cm AP x 5.3 cm TR x 12.4 cm CC (series 6, image 266; series 9, image 103). Collection approximates the surgical incision site as evident by overlying  skin staples. No foci of gas within the soft tissues tracking beyond the site of fluid collection. Subcutaneous edema within the left thigh most pronounced anteriorly at the level of the hip. No deep fascial fluid collection. Multiple mildly enlarged left inguinal lymph nodes are again seen. Subcutaneous edema throughout the right lower extremity most pronounced along the lateral aspect of the knee and calf. No organized fluid collection. No soft tissue gas within the right lower extremity. No deep fascial fluid collections are evident. For findings within the pelvis. Please see concurrently obtained dedicated CT of the chest, abdomen, and pelvis. IMPRESSION: 1. Ill-defined mixed density fluid collection within the soft tissues at the distal stump laterally measuring approximately 12.4 x 5.3 x 10.3 cm. Findings are suggestive of hematoma. There are multiple foci of air within the collection, which may be related to recent surgery. Superimposed infected collection not excluded. 2. No evidence of osteomyelitis by CT. 3. Nonspecific subcutaneous edema within the left thigh and throughout the right lower extremity, which may reflect cellulitis. No additional organized fluid collection. 4.  Multiple mildly enlarged left inguinal lymph nodes, likely reactive. 5. For findings within the pelvis. Please see concurrently obtained dedicated CT of the chest, abdomen, and pelvis. Electronically Signed   By: Duanne Guess D.O.   On: 09/17/2020 13:26    CT CHEST ABDOMEN PELVIS WO CONTRAST   Result Date: 09/17/2020 CLINICAL DATA:  Suspected retroperitoneal hematoma . Anemia. Severe sepsis and acute renal failure. Necrotizing fasciitis. 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 EXAM: CT CHEST, ABDOMEN AND PELVIS WITHOUT CONTRAST TECHNIQUE: Multidetector CT imaging of the chest, abdomen and pelvis was performed following the standard protocol without IV contrast. COMPARISON:  09/01/2020 abdominal ultrasound. 03/01/2020  CTA of the chest, abdomen, and pelvis. FINDINGS: CT CHEST FINDINGS Cardiovascular: Mild motion degradation. Status post ascending aortic repair and transverse aortic stent graft. The descending thoracic aorta measures on the order of 5.3 cm on 39/3, similar to on the prior. The dissection is not well evaluated on this noncontrast exam. Tortuous descending thoracic aorta. Moderate cardiomegaly, without pericardial effusion. Lad coronary artery calcification. Pulmonary artery enlargement, outflow tract 3.5 cm. Mediastinum/Nodes: Right chest wall surgical clips. Right paratracheal node of 1.7 cm on 27/3, similar. Hilar regions poorly evaluated without intravenous contrast. Lungs/Pleura: Trace bilateral pleural fluid, new since prior CT. Left base scarring or subsegmental atelectasis. Posterior left upper lobe dependent atelectasis as well. Musculoskeletal: Marked bilateral gynecomastia. Prior median sternotomy. No acute osseous abnormality. CT ABDOMEN PELVIS FINDINGS Hepatobiliary: Hepatomegaly at 21.4 cm craniocaudal. Dependent gallstones. No acute cholecystitis or biliary duct dilatation. Pancreas: Normal, without mass or ductal dilatation. Spleen: Normal in size, without focal abnormality. Adrenals/Urinary Tract: Normal adrenal glands. No renal calculi or hydronephrosis. No hydroureter or ureteric calculi. No bladder calculi. Stomach/Bowel: Normal stomach, without wall thickening. Colonic stool burden suggests constipation. Normal terminal ileum and appendix. Normal small bowel. Vascular/Lymphatic: Left renal artery stent. Left common and external iliac artery stents. No pelvic or abdominal retroperitoneal hemorrhage/hematoma. No abdominopelvic adenopathy. Reproductive: Normal prostate. Other: No significant free fluid. No free intraperitoneal air. Extensive subcutaneous edema throughout the proximal left lower extremity. Intramuscular edema and fluid are incompletely imaged, including on 136/3. Musculoskeletal: No  acute osseous abnormality. IMPRESSION: 1. Subcutaneous and intramuscular edema/fluid throughout the imaged proximal left lower extremity. This will be more completely evaluated on dedicated extremity CT, dictated separately. 2. No pelvic or abdominal retroperitoneal hematoma identified. 3. Similar appearance of the thoracic aorta, status post ascending aortic repair and transverse aortic stent graft. 4. Aortic atherosclerosis (ICD10-I70.0) and emphysema (ICD10-J43.9). 5. Tiny bilateral pleural effusions. 6. Cholelithiasis. 7.  Possible constipation. 8. Pulmonary artery enlargement suggests pulmonary arterial hypertension. 9. Mild thoracic adenopathy, favored to be reactive. 10. Bilateral marked gynecomastia. Electronically Signed   By: Jeronimo Greaves M.D.   On: 09/17/2020 13:26             Medical Problem List and Plan: 1.  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  Antithrombotics: -DVT/anticoagulation: Chronic Eliquis has been resumed.             -antiplatelet therapy:  3. Pain Management: Voltaren gel 4 times daily, Neurontin 400 mg 3 times daily, OxyContin CR 20 mg every 12 hours, oxycodone as needed, Robaxin 1000 mg every 6 hours as needed 4. Mood: Provide emotional support             -  antipsychotic agents: N/A 5. Neuropsych: This patient is capable of making decisions on his own behalf. 6. Skin/Wound Care: Routine skin checks 7. Fluids/Electrolytes/Nutrition: Routine in and outs with follow-up chemistries 8.  Severe normocytic anemia.  Follow-up CBC.  Follow-up hematology services 9.  Atrial fibrillation.  Cardizem 240 mg daily, continue Eliquis. 10.  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  Latest creatinine 1.45, total CK 202.  Follow-up renal services 11.  Hypertension.  Aldactone 25 mg daily Normodyne 400 mg twice daily.  Monitor with increased mobility 12.  Aortic dissection with repair.  Follow-up  outpatient 13.  Obesity.  Dietary follow-up    Charlton Amor, PA-C 09/19/2020         "I have personally performed a face to face diagnostic evaluation of this patient.  Additionally, I have reviewed and concur with the physician assistant's documentation above." Erick Colace M.D. [facility] Fellow [employer] Diplomate Am Board of Electrodiagnostic Med Fellow Am Board of Interventional Pain

## 2020-09-19 NOTE — H&P (Incomplete)
Physical Medicine and Rehabilitation Admission H&P    Chief Complaint  Patient presents with  . Fever  : HPI: Connor [name] is a 65 year old right-handed male with history of aortic dissection with several complicated surgery including redo thoracic aortic repair as well as sternal infection, atrial fibrillation on Eliquis, CAD with CABG, CKD stage II-3, hypertension, obesity.  Per chart review patient lives with his autistic cousin in a mobile home ramped entrance.  Independent with assistive device.  Community ambulator with straight point cane and driving.  .  Presented 08/30/2020 with persistent nausea vomiting diarrhea x5 days.  In the ED noted fever 102.7 he was tachycardic.  Chemistry showed a potassium 2.8 glucose 162 BUN 55 creatinine 4.21 from baseline of 1.38, WBC 15,600, hemoglobin 10.5, platelets 136,000, lactic acid 2.1 troponin 425-482, blood cultures GPC x2 Streptococcus urine culture E Faecalis, CK 28,083.Marland Kitchen  Chest x-ray showed no acute intrathoracic process.  Patient noted edema and swelling of the left lower extremity.  CT of left lower extremity showed no fracture or malalignment.  No significant hip or knee effusion however there was moderate subcutaneous edema within the left lower extremity extending from the hip to the imaged proximal lower leg possibly representing cellulitis or nonspecific lower extremity edema.  No gas containing fluid collections.  Multiple enlarged left inguinal lymph nodes the largest measuring up to 3 cm.  Nephrology consulted for AKI on CKD with rhabdomyolysis and felt AKI likely secondary to acute tubular injury decreased renal perfusion related to hypotension.  Placed on gentle IV fluids and renal functions much improved with latest creatinine 1.45 and latest CK 202.Marland Kitchen  Patient had been maintained on broad-spectrum antibiotics for suspected sepsis/cellulitis.  Orthopedic service follow-up Dr. 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  Hospital course patient required multiple transfusions for anemia.  His chronic anticoagulation was resumed.  Hematology service Dr. Pamelia Hoit consulted in regards to anemia felt to be multifactorial and work-up with iron studies and reticulocyte count with latest hemoglobin 7.4, platelets 217,000.  No current indications for bone marrow biopsies.  Therapy evaluations completed due to patient's decreased functional ability/multimedical he was admitted for a comprehensive rehab program  Review of Systems  Constitutional: Positive for fever. Negative for chills.  HENT: Negative for hearing loss.   Eyes: Negative for blurred vision and double vision.  Respiratory: Positive for shortness of breath. Negative for cough.   Cardiovascular: Positive for palpitations and leg swelling. Negative for chest pain.  Gastrointestinal: Positive for abdominal pain, diarrhea, nausea and vomiting.  Genitourinary: Negative for dysuria, flank pain and hematuria.  Musculoskeletal: Positive for joint pain and myalgias.  Skin: Negative for rash.  All other systems reviewed and are negative.  Past Medical History:  Diagnosis Date  . AAA (abdominal aortic aneurysm) (HCC)   . History of open heart surgery   . Hypertension   . Seroma due to trauma [facility])    Past Surgical History:  Procedure Laterality Date  . ABDOMINAL AORTIC ANEURYSM REPAIR    . AMPUTATION Left 09/07/2020   Procedure: ATTEMPTED LEFT LEG DEBRIDEMENT FASCIOTOMIES, APPLY INSTILLATION WOUND VAC, ABOVE KNEE AMPUTATION;  Surgeon: Nadara Mustard, MD;  Location: [facility] OR;  Service: Orthopedics;  Laterality: Left;  . BUBBLE STUDY  09/05/2020   Procedure: BUBBLE STUDY;  Surgeon: Meriam Sprague, MD;  Location: [facility] ENDOSCOPY;  Service: Cardiovascular;;  . CARDIOVERSION N/A 02/11/2020  Procedure: CARDIOVERSION;  Surgeon: Little IshikawaSchumann, Christopher L, MD;  [facility];  Service: Cardiovascular;  Laterality: N/A;  . CORONARY ARTERY BYPASS GRAFT    . STUMP REVISION Left 09/09/2020   Procedure: REVISION LEFT ABOVE KNEE AMPUTATION;  Surgeon: Nadara Mustarduda, Marcus V, MD;  [facility];  Service: Orthopedics;  Laterality: Left;  . TEE WITHOUT CARDIOVERSION N/A 09/05/2020   Procedure: TRANSESOPHAGEAL ECHOCARDIOGRAM (TEE);  Surgeon: Meriam SpraguePemberton, Heather E, MD;  [facility];  Service: Cardiovascular;  Laterality: N/A;   Family History  Problem Relation Age of Onset  . CAD Mother   . CVA Father    Social History:  reports that he has been smoking. He has been smoking about 0.50 packs per day. He has never used smokeless tobacco. He reports that he does not drink alcohol and does not use drugs. Allergies: No Known Allergies Medications Prior to Admission  Medication Sig Dispense Refill  . aspirin EC 81 MG tablet Take 81 mg by mouth daily. Swallow whole.    Marland Kitchen. ELIQUIS 5 MG TABS tablet Take 1 tablet (5 mg total) by mouth 2 (two) times daily. 180 tablet 3  . labetalol (NORMODYNE) 200 MG tablet Take 2 tablets (400 mg total) by mouth 2 (two) times daily. (Patient taking differently: Take 200 mg by mouth 2 (two) times daily.) 360 tablet 3  . Multiple Vitamins-Minerals (MULTIVITAMIN WITH MINERALS) tablet Take 1 tablet by mouth daily. Men    . NIFEdipine (ADALAT CC) 90 MG 24 hr tablet TAKE 1 TABLET(90 MG) BY MOUTH DAILY (Patient taking differently: Take 90 mg by mouth daily.) 90 tablet 2  . spironolactone (ALDACTONE) 25 MG tablet TAKE 1/2 TABLET(12.5 MG) BY MOUTH DAILY (Patient taking differently: Take 25 mg by mouth daily.) 30 tablet 10  . valsartan (DIOVAN) 80 MG tablet Take 1 tablet (80 mg total) by mouth daily. 90 tablet 3    Drug Regimen Review Drug regimen was reviewed and remains appropriate with no significant issues identified  Home: Home Living Family/patient expects to be discharged to:: Private residence Living Arrangements: Other relatives  (cousin who cannot provide physical assist) Available Help at Discharge: Family,Available PRN/intermittently Type of Home: Mobile home Home Access: Ramped entrance Home Layout: One level Bathroom Shower/Tub: Engineer, manufacturing systemsTub/shower unit Bathroom Toilet: Standard Bathroom Accessibility: Yes Home Equipment: Cane - single point Additional Comments: pt states his cousin is disabled and could not provide any physical assist   Functional History: Prior Function Level of Independence: Independent with assistive device(s) 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 Transfers Overall transfer level: Needs assistance Equipment used: Rolling walker (2 wheeled) Transfers: Sit to/from Stand Sit to Stand: Min assist,+2 physical assistance,+2 safety/equipment Stand pivot transfers: Min assist,Min guard General transfer comment: MinAx1-2 and extra time to power up to stand, cuing for pt to scoot anteriorly and keep hands on recliner to push up to stand then transition 1 hand at a time to RW once buttocks is cleared, success but poor carryover. Cued pt to reach back for chair to control descent with return to sit, min success. Ambulation/Gait Ambulation/Gait assistance: Min assist,+2 safety/equipment,+2 physical assistance Gait Distance (Feet): 12 Feet (x2  bouts of ~8 ft > ~12 ft, seated rest break between bouts) Assistive device: Rolling walker (2 wheeled) Gait Pattern/deviations: Step-to pattern,Decreased stride length,Trunk flexed ("hop to") General Gait Details: Slow, short hopping steps, relying  heavily on hands on RW during swing phase of R leg. Cued pt to monitor himself and his fatigue and to appropriately return to sit to rest prior to complete LOB due to fatigue, with success but reminders throughout. MinAx1-2 with chair follow to maintain balance and safety. Gait velocity: decr Gait velocity interpretation: <1.31 ft/sec, indicative of household ambulator    ADL: ADL Overall ADL's : Needs assistance/impaired Eating/Feeding: Set up,Sitting Grooming: Set up,Bed level,Wash/dry hands,Wash/dry face Upper Body Bathing: Maximal assistance,Bed level Lower Body Bathing: Total assistance,Bed level Upper Body Dressing : Moderate assistance,Bed level Lower Body Dressing: Total assistance,Bed level Toilet Transfer: Moderate assistance,Stand-pivot,BSC,RW Toilet Transfer Details (indicate cue type and reason): mod A to powerup, min A to control descent. Cues for sequencing and hand placement with rw. Repetition with verbal cues needed at times. Toileting- Clothing Manipulation and Hygiene: Total assistance,Bed level Toileting - Clothing Manipulation Details (indicate cue type and reason): Pt unable to properly clean self Functional mobility during ADLs: Minimal assistance,Cueing for safety,Cueing for sequencing,Rolling walker General ADL Comments: Pt completed bed mobility, managed urinal to void sitting EOB at min guard - min A level (posterior LOB, min A to correct).. took pivotal steps from EOB to recliner simulating toilet transfer to [facility]. Up to mod A needed.  Cognition: Cognition Overall Cognitive Status: No family/caregiver present to determine baseline cognitive functioning Orientation Level: Oriented X4 Cognition Arousal/Alertness: Awake/alert Behavior During Therapy: Flat affect Overall Cognitive Status: No family/caregiver present to determine baseline cognitive functioning Area of Impairment: Problem solving,Safety/judgement Safety/Judgement: Decreased awareness of  safety Awareness: Intellectual Problem Solving: Slow processing General Comments: Increased time to follow one step commands at times. Difficulty sequencing during transfers.  Physical Exam: Blood pressure (!) 152/83, pulse 61, temperature 98.7 F (37.1 C), temperature source Oral, resp. rate 18, height 5\' 11"  (1.803 m), weight 113.9 kg, SpO2 94 %. Physical Exam Skin:    Comments: Left AKA site is dressed appropriately tender  Neurological:     Comments: Patient is alert in no acute distress oriented x3 and follows commands.     Results for orders placed or performed during the hospital encounter of 08/30/20 (from the past 48 hour(s))  Hemoglobin and hematocrit, blood     Status: Abnormal   Collection Time: 09/17/20  1:35 PM  Result Value Ref Range   Hemoglobin 6.5 (LL) 13.0 - 17.0 g/dL    Comment: REPEATED TO VERIFY THIS CRITICAL RESULT HAS VERIFIED AND BEEN CALLED TO M TETTEH RN BY KYUNG BAEK ON 01 22 2022 AT 1421, AND HAS BEEN READ BACK.     HCT 21.5 (L) 39.0 - 52.0 %    Comment: Performed at [facility], [address] [place], [place]  Prepare RBC (crossmatch)     Status: None   Collection Time: 09/17/20  7:58 PM  Result Value Ref Range   Order Confirmation      ORDER PROCESSED BY BLOOD BANK Performed at [facility], [address] [place], [place]   Hemoglobin and hematocrit, blood     Status: Abnormal   Collection Time: 09/17/20  8:22 PM  Result Value Ref Range   Hemoglobin 6.5 (LL) 13.0 - 17.0 g/dL    Comment: REPEATED TO VERIFY CRITICAL VALUE NOTED.  VALUE IS CONSISTENT  WITH PREVIOUSLY REPORTED AND CALLED VALUE.    HCT 20.5 (L) 39.0 - 52.0 %    Comment: Performed at [facility], [address], [place], [place] [address]  Hemoglobin and hematocrit, blood     Status: Abnormal   Collection Time: 09/18/20  4:34 AM  Result Value Ref Range   Hemoglobin 7.1 (L) 13.0 - 17.0 g/dL   HCT 60.4 (L) 54.0 - 98.1 %    Comment:  Performed at [facility], [address] [place], [place] [address]  Basic metabolic panel     Status: Abnormal   Collection Time: 09/18/20  8:37 AM  Result Value Ref Range   Sodium 141 135 - 145 mmol/L   Potassium 4.3 3.5 - 5.1 mmol/L   Chloride 106 98 - 111 mmol/L   CO2 26 22 - 32 mmol/L   Glucose, Bld 93 70 - 99 mg/dL    Comment: Glucose reference range applies only to samples taken after fasting for at least 8 hours.   BUN 18 8 - 23 mg/dL   Creatinine, Ser 8.29 (H) 0.61 - 1.24 mg/dL   Calcium 8.0 (L) 8.9 - 10.3 mg/dL   GFR, Estimated 54 (L) >60 mL/min    Comment: (NOTE) Calculated using the CKD-EPI Creatinine Equation (2021)    Anion gap 9 5 - 15    Comment: Performed at [facility], [address], [place], [place] [address]  CBC     Status: Abnormal   Collection Time: 09/18/20  8:37 AM  Result Value Ref Range   WBC 7.3 4.0 - 10.5 K/uL   RBC 2.37 (L) 4.22 - 5.81 MIL/uL   Hemoglobin 7.2 (L) 13.0 - 17.0 g/dL   HCT 08.6 (L) 57.8 - 46.9 %   MCV 95.4 80.0 - 100.0 fL   MCH 30.4 26.0 - 34.0 pg   MCHC 31.9 30.0 - 36.0 g/dL   RDW 62.9 (H) 52.8 - 41.3 %   Platelets 231 150 - 400 K/uL   nRBC 0.3 (H) 0.0 - 0.2 %    Comment: Performed at [facility], [address], [place], [place] [address]  CBC     Status: Abnormal   Collection Time: 09/18/20  2:12 PM  Result Value Ref Range   WBC 8.0 4.0 - 10.5 K/uL   RBC 2.51 (L) 4.22 - 5.81 MIL/uL   Hemoglobin 7.4 (L) 13.0 - 17.0 g/dL   HCT 02.7 (L) 25.3 - 66.4 %   MCV 96.4 80.0 - 100.0 fL   MCH 29.5 26.0 - 34.0 pg   MCHC 30.6 30.0 - 36.0 g/dL   RDW 40.3 (H) 47.4 - 25.9 %   Platelets 217 150 - 400 K/uL   nRBC 0.0 0.0 - 0.2 %    Comment: Performed at [facility], [address] [place], [place] [address]  Iron and TIBC     Status: Abnormal   Collection Time: 09/18/20  2:12 PM  Result Value Ref Range   Iron 32 (L) 45 - 182 ug/dL   TIBC 564 (L) 332 - 951 ug/dL   Saturation Ratios 14 (L) 17.9 - 39.5 %    UIBC 192 ug/dL    Comment: Performed at [facility], [address] [place], [place] [address]  Ferritin     Status: Abnormal   Collection Time: 09/18/20  2:12 PM  Result Value Ref Range   Ferritin 625 (H) 24 - 336 ng/mL  Comment: Performed at [facility], [address] [place], [place] [address]  Reticulocytes     Status: Abnormal   Collection Time: 09/18/20  2:12 PM  Result Value Ref Range   Retic Ct Pct 3.4 (H) 0.4 - 3.1 %   RBC. 2.51 (L) 4.22 - 5.81 MIL/uL   Retic Count, Absolute 85.3 19.0 - 186.0 K/uL   Immature Retic Fract 29.7 (H) 2.3 - 15.9 %    Comment: Performed at [facility], [address], [place], [place] [address]   CT EXTREMITY LOWER LEFT WO CONTRAST  Result Date: 09/17/2020 CLINICAL DATA:  Sepsis.  Soft tissue infection suspected EXAM: CT OF THE LOWER LEFT EXTREMITY WITHOUT CONTRAST CT OF THE LOWER RIGHT EXTREMITY WITHOUT CONTRAST TECHNIQUE: Multidetector CT imaging of the lower left extremity and lower right extremity was performed according to the standard protocol. 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 No erosion or periostitis at the resection margin to suggest osteomyelitis. No acute fracture or malalignment. No evidence of femoral head avascular necrosis. Ligaments Suboptimally assessed by CT. Muscles and Tendons Postsurgical changes to the left thigh musculature. No acute tendinous injury. Soft tissues There is an ill-defined mixed density fluid collection within the soft tissues at the distal stump laterally. There are mixed areas of high attenuation fluid with areas of more decreased attenuation as well as multiple locules of soft tissue gas. Approximate measurements of the collection are 10.3 cm AP x 5.3 cm TR x 12.4 cm CC (series 6, image 266; series 9, image 103). Collection approximates the surgical incision site as evident by overlying skin  staples. No foci of gas within the soft tissues tracking beyond the site of fluid collection. Subcutaneous edema within the left thigh most pronounced anteriorly at the level of the hip. No deep fascial fluid collection. Multiple mildly enlarged left inguinal lymph nodes are again seen. Subcutaneous edema throughout the right lower extremity most pronounced along the lateral aspect of the knee and calf. No organized fluid collection. No soft tissue gas within the right lower extremity. No deep fascial fluid collections are evident. For findings within the pelvis. Please see concurrently obtained dedicated CT of the chest, abdomen, and pelvis. IMPRESSION: 1. Ill-defined mixed density fluid collection within the soft tissues at the distal stump laterally measuring approximately 12.4 x 5.3 x 10.3 cm. Findings are suggestive of hematoma. There are multiple foci of air within the collection, which may be related to recent surgery. Superimposed infected collection not excluded. 2. No evidence of osteomyelitis by CT. 3. Nonspecific subcutaneous edema within the left thigh and throughout the right lower extremity, which may reflect cellulitis. No additional organized fluid collection. 4. Multiple mildly enlarged left inguinal lymph nodes, likely reactive. 5. For findings within the pelvis. Please see concurrently obtained dedicated CT of the chest, abdomen, and pelvis. Electronically Signed   By: Duanne Guess D.O.   On: 09/17/2020 13:26   CT EXTREMITY LOWER RIGHT WO CONTRAST  Result Date: 09/17/2020 CLINICAL DATA:  Sepsis.  Soft tissue infection suspected EXAM: CT OF THE LOWER LEFT EXTREMITY WITHOUT CONTRAST CT OF THE LOWER RIGHT EXTREMITY WITHOUT CONTRAST TECHNIQUE: Multidetector CT imaging of the lower left extremity and lower right extremity was performed according to the standard protocol. COMPARISON:  MRI left femur 09/01/2020 FINDINGS: Bones/Joint/Cartilage Status post above knee amputation of the left lower  extremity. No erosion or periostitis at the resection margin to suggest  osteomyelitis. No acute fracture or malalignment. No evidence of femoral head avascular necrosis. Ligaments Suboptimally assessed by CT. Muscles and Tendons Postsurgical changes to the left thigh musculature. No acute tendinous injury. Soft tissues There is an ill-defined mixed density fluid collection within the soft tissues at the distal stump laterally. There are mixed areas of high attenuation fluid with areas of more decreased attenuation as well as multiple locules of soft tissue gas. Approximate measurements of the collection are 10.3 cm AP x 5.3 cm TR x 12.4 cm CC (series 6, image 266; series 9, image 103). Collection approximates the surgical incision site as evident by overlying skin staples. No foci of gas within the soft tissues tracking beyond the site of fluid collection. Subcutaneous edema within the left thigh most pronounced anteriorly at the level of the hip. No deep fascial fluid collection. Multiple mildly enlarged left inguinal lymph nodes are again seen. Subcutaneous edema throughout the right lower extremity most pronounced along the lateral aspect of the knee and calf. No organized fluid collection. No soft tissue gas within the right lower extremity. No deep fascial fluid collections are evident. For findings within the pelvis. Please see concurrently obtained dedicated CT of the chest, abdomen, and pelvis. IMPRESSION: 1. Ill-defined mixed density fluid collection within the soft tissues at the distal stump laterally measuring approximately 12.4 x 5.3 x 10.3 cm. Findings are suggestive of hematoma. There are multiple foci of air within the collection, which may be related to recent surgery. Superimposed infected collection not excluded. 2. No evidence of osteomyelitis by CT. 3. Nonspecific subcutaneous edema within the left thigh and throughout the right lower extremity, which may reflect cellulitis. No additional  organized fluid collection. 4. Multiple mildly enlarged left inguinal lymph nodes, likely reactive. 5. For findings within the pelvis. Please see concurrently obtained dedicated CT of the chest, abdomen, and pelvis. Electronically Signed   By: Duanne GuessNicholas  Plundo D.O.   On: 09/17/2020 13:26   CT CHEST ABDOMEN PELVIS WO CONTRAST  Result Date: 09/17/2020 CLINICAL DATA:  Suspected retroperitoneal hematoma . Anemia. Severe sepsis and acute renal failure. Necrotizing fasciitis. 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 EXAM: CT CHEST, ABDOMEN AND PELVIS WITHOUT CONTRAST TECHNIQUE: Multidetector CT imaging of the chest, abdomen and pelvis was performed following the standard protocol without IV contrast. COMPARISON:  09/01/2020 abdominal ultrasound. 03/01/2020 CTA of the chest, abdomen, and pelvis. FINDINGS: CT CHEST FINDINGS Cardiovascular: Mild motion degradation. Status post ascending aortic repair and transverse aortic stent graft. The descending thoracic aorta measures on the order of 5.3 cm on 39/3, similar to on the prior. The dissection is not well evaluated on this noncontrast exam. Tortuous descending thoracic aorta. Moderate cardiomegaly, without pericardial effusion. Lad coronary artery calcification. Pulmonary artery enlargement, outflow tract 3.5 cm. Mediastinum/Nodes: Right chest wall surgical clips. Right paratracheal node of 1.7 cm on 27/3, similar. Hilar regions poorly evaluated without intravenous contrast. Lungs/Pleura: Trace bilateral pleural fluid, new since prior CT. Left base scarring or subsegmental atelectasis. Posterior left upper lobe dependent atelectasis as well. Musculoskeletal: Marked bilateral gynecomastia. Prior median sternotomy. No acute osseous abnormality. CT ABDOMEN PELVIS FINDINGS Hepatobiliary: Hepatomegaly at 21.4 cm craniocaudal. Dependent gallstones. No acute cholecystitis or biliary duct dilatation. Pancreas: Normal, without mass or ductal dilatation. Spleen: Normal  in size, without focal abnormality. Adrenals/Urinary Tract: Normal adrenal glands. No renal calculi or hydronephrosis. No hydroureter or ureteric calculi. No bladder calculi. Stomach/Bowel: Normal stomach, without wall thickening. Colonic stool burden suggests constipation. Normal terminal ileum and  appendix. Normal small bowel. Vascular/Lymphatic: Left renal artery stent. Left common and external iliac artery stents. No pelvic or abdominal retroperitoneal hemorrhage/hematoma. No abdominopelvic adenopathy. Reproductive: Normal prostate. Other: No significant free fluid. No free intraperitoneal air. Extensive subcutaneous edema throughout the proximal left lower extremity. Intramuscular edema and fluid are incompletely imaged, including on 136/3. Musculoskeletal: No acute osseous abnormality. IMPRESSION: 1. Subcutaneous and intramuscular edema/fluid throughout the imaged proximal left lower extremity. This will be more completely evaluated on dedicated extremity CT, dictated separately. 2. No pelvic or abdominal retroperitoneal hematoma identified. 3. Similar appearance of the thoracic aorta, status post ascending aortic repair and transverse aortic stent graft. 4. Aortic atherosclerosis (ICD10-I70.0) and emphysema (ICD10-J43.9). 5. Tiny bilateral pleural effusions. 6. Cholelithiasis. 7.  Possible constipation. 8. Pulmonary artery enlargement suggests pulmonary arterial hypertension. 9. Mild thoracic adenopathy, favored to be reactive. 10. Bilateral marked gynecomastia. Electronically Signed   By: Jeronimo Greaves M.D.   On: 09/17/2020 13:26       Medical Problem List and Plan: 1.  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  Antithrombotics: -DVT/anticoagulation: Chronic Eliquis has been resumed.  -antiplatelet therapy:  3. Pain Management: Voltaren gel 4 times daily, Neurontin 400 mg 3 times daily, OxyContin CR 20 mg every 12 hours, oxycodone as  needed, Robaxin 1000 mg every 6 hours as needed 4. Mood: Provide emotional support  -antipsychotic agents: N/A 5. Neuropsych: This patient is capable of making decisions on his own behalf. 6. Skin/Wound Care: Routine skin checks 7. Fluids/Electrolytes/Nutrition: Routine in and outs with follow-up chemistries 8.  Severe normocytic anemia.  Follow-up CBC.  Follow-up hematology services 9.  Atrial fibrillation.  Cardizem 240 mg daily, continue Eliquis. 10.  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  Latest creatinine 1.45, total CK 202.  Follow-up renal services 11.  Hypertension.  Aldactone 25 mg daily Normodyne 400 mg twice daily.  Monitor with increased mobility 12.  Aortic dissection with repair.  Follow-up outpatient 13.  Obesity.  Dietary follow-up ***  Charlton Amor, PA-C 09/19/2020

## 2020-09-19 NOTE — Progress Notes (Signed)
Kirsteins, Luanna Salk, MD  Physician  Physical Medicine and Rehabilitation  PMR Pre-admission      Signed  Date of Service:  09/16/2020  9:00 AM      Related encounter: ED to Hosp-Admission (Discharged) from 08/30/2020 in [place]       Signed          Show:Clear all [x] Manual[x] Template[x] Copied  Added by: [x] Raechel Ache, OT[x] Kirsteins, Luanna Salk, MD[x] Merlyn Albert Farley Ly, CCC-SLP   [] Hover for details  PMR Admission Coordinator Pre-Admission Assessment   Patient: Connor [name] is an 65 y.o., male MRN: [id number] DOB: 20-Jan-1956 Height: 5' 11"  (180.3 cm) Weight: 113.9 kg   Insurance Information HMO:     PPO:   PCP:      IPA:      80/20:      OTHER:  PRIMARY: Medicaid [place]     Policy#: [id number]      Subscriber: Pt CM Name:       Phone#:      Fax#:  Pre-Cert#:      Employer:  Benefits:  Phone #:      Name:  Eff. Date: Effective 09/16/2020, verified via passport onesource     Deduct:       Out of Pocket Max:       Life Max:  CIR:   Covered per Medicaid Guidelines    SNF:  Outpatient:      Co-Pay:  Home Health:       Co-Pay:  DME:      Co-Pay:  Providers:  SECONDARY:       Policy#:      Phone#:    Development worker, community:       Phone#:    The "Data Collection Information Summary" for patients in Inpatient Rehabilitation Facilities with attached "Privacy Act [place] Records" was provided and verbally reviewed with: NA   Emergency Contact Information         Contact Information     Name Relation Home Work Mobile    Towery,NICOLE Niece     [phone]         Current Medical History  Patient Admitting Diagnosis: L AKA   History of Present Illness: 65 yo male with PMH o: AAA, CABG, and HTN.presented to the ED nausea/vomiting, fever, and LLE pain and swelling. Pt found to have sepsis and rhabdomyolysis stemming from LLE cellulitis/myofascitis. 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  Had revision of surgery on  1/14 due to overuse in relation to left leg injuries/ infection. Post op, pt. developed anemia due to acute blood loss and has received 6 units of PRBC, most recently on 09/16/2020. On 1/23, Eliquis stopped and CT of leg obtained and it showed a hematoma, most likely subacute. Hematology was consulted due to anemia; His severe normocytic amemia was felt to be multifactorial; currently feel his bone marrow is starting to recover and his hemoglobin has stabilized with recommendation for continued monitoring. Pt. Also with AKI on CKD 3a.  Cr rose from 4.21 to 7.61 on 17 and subsequently has improved to 1.48 on 1/18. Cause was likely sepsis complicated by a prerenal state (vomiting prior to admission) and rhabdomyolysis.  Spironolactone on hold. CIR was consulted to assist pt in return to PLOF. Pt is to admit to CIR on 09/19/20.    Patient's medical record from [facility] has been reviewed by the rehabilitation admission coordinator and physician.   Past Medical History  Past Medical History:  Diagnosis Date  . AAA (abdominal aortic aneurysm) ([place])    . History of open heart surgery    . Hypertension    . Seroma due to trauma [facility])        Family History   family history includes CAD in his mother; CVA in his father.   Prior Rehab/Hospitalizations Has the patient had prior rehab or hospitalizations prior to admission? No   Has the patient had major surgery during 100 days prior to admission? Yes              Current Medications   Current Facility-Administered Medications:  .  apixaban (ELIQUIS) tablet 5 mg, 5 mg, Oral, BID, Rizwan, Saima, MD, 5 mg at 09/15/20 2228 .  aspirin EC tablet 81 mg, 81 mg, Oral, Daily, Persons, Bevely Palmer, [place], 81 mg at 09/15/20 0848 .  cefTRIAXone (ROCEPHIN) 2 g in sodium chloride 0.9 % 100 mL IVPB, 2 g, Intravenous, Q24H, Pham, Minh Q, RPH-CPP, Last Rate: 200 mL/hr at 09/15/20 2328, 2 g at 09/15/20 2328 .  diclofenac Sodium (VOLTAREN) 1 % topical  gel 2 g, 2 g, Topical, QID, Rizwan, Saima, MD, 2 g at 09/15/20 2228 .  diltiazem (CARDIZEM CD) 24 hr capsule 240 mg, 240 mg, Oral, Daily, Barrett, Rhonda G, PA-C, 240 mg at 09/15/20 0847 .  feeding supplement (ENSURE ENLIVE / ENSURE PLUS) liquid 237 mL, 237 mL, Oral, BID BM, Persons, Bevely Palmer, PA, 237 mL at 09/15/20 1335 .  gabapentin (NEURONTIN) capsule 400 mg, 400 mg, Oral, TID, Rizwan, Saima, MD, 400 mg at 09/15/20 2228 .  hydrALAZINE (APRESOLINE) injection 10 mg, 10 mg, Intravenous, Q6H PRN, Persons, Bevely Palmer, PA, 10 mg at 09/09/20 1250 .  HYDROmorphone (DILAUDID) injection 0.5 mg, 0.5 mg, Intravenous, Q2H PRN, Rizwan, Saima, MD .  labetalol (NORMODYNE) tablet 400 mg, 400 mg, Oral, BID, Barrett, Rhonda G, PA-C, 400 mg at 09/15/20 2228 .  MEDLINE mouth rinse, 15 mL, Mouth Rinse, BID, Persons, Bevely Palmer, [place], 15 mL at 09/15/20 2228 .  ondansetron (ZOFRAN) tablet 4 mg, 4 mg, Oral, Q6H PRN **OR** ondansetron (ZOFRAN) injection 4 mg, 4 mg, Intravenous, Q6H PRN, Persons, Bevely Palmer, PA .  oxyCODONE (Oxy IR/ROXICODONE) immediate release tablet 5-10 mg, 5-10 mg, Oral, QID, Rizwan, Saima, MD, 10 mg at 09/15/20 2315 .  oxyCODONE (OXYCONTIN) 12 hr tablet 20 mg, 20 mg, Oral, Q12H, Rizwan, Saima, MD, 20 mg at 09/15/20 2208 .  polyethylene glycol (MIRALAX / GLYCOLAX) packet 17 g, 17 g, Oral, Daily, Rizwan, Saima, MD, 17 g at 09/15/20 1731 .  sodium chloride flush (NS) 0.9 % injection 10-40 mL, 10-40 mL, Intracatheter, Q12H, Persons, Bevely Palmer, PA, 10 mL at 09/15/20 2230 .  sodium chloride flush (NS) 0.9 % injection 10-40 mL, 10-40 mL, Intracatheter, PRN, Persons, Bevely Palmer, PA   Patients Current Diet:     Diet Order                      Diet Carb Modified Fluid consistency: Thin; Room service appropriate? Yes  Diet effective now                      Precautions / Restrictions Precautions Precautions: Fall Precaution Comments: L AKA Restrictions Weight Bearing Restrictions: Yes LLE Weight  Bearing: Non weight bearing Other Position/Activity Restrictions: minimal elevation of LLE in bed    Has the patient had 2 or more falls or a fall with injury in  the past year? No   Prior Activity Level Community (5-7x/wk): Active in the community PTA   Prior Functional Level Self Care: Did the patient need help bathing, dressing, using the toilet or eating? Independent   Indoor Mobility: Did the patient need assistance with walking from room to room (with or without device)? Independent   Stairs: Did the patient need assistance with internal or external stairs (with or without device)? Independent   Functional Cognition: Did the patient need help planning regular tasks such as shopping or remembering to take medications? [place] / Equipment Home Assistive Devices/Equipment: Cane (specify quad or straight) (uses his uncle's) Home Equipment: Cane - single point   Prior Device Use: Indicate devices/aids used by the patient prior to current illness, exacerbation or injury? None of the above   Current Functional Level Cognition   Overall Cognitive Status: No family/caregiver present to determine baseline cognitive functioning Orientation Level: Oriented X4 Safety/Judgement: Decreased awareness of safety General Comments: Increased time to follow one step commands at times. Difficulty sequencing during transfers.    Extremity Assessment (includes Sensation/Coordination)   Upper Extremity Assessment: Generalized weakness  Lower Extremity Assessment: Defer to PT evaluation LLE Deficits / Details: L LE AKA with wound vac LLE: Unable to fully assess due to pain LLE Sensation: WNL LLE Coordination: WNL     ADLs   Overall ADL's : Needs assistance/impaired Eating/Feeding: Set up,Sitting Grooming: Set up,Bed level,Wash/dry hands,Wash/dry face Upper Body Bathing: Maximal assistance,Bed level Lower Body Bathing: Total assistance,Bed level Upper Body Dressing  : Moderate assistance,Bed level Lower Body Dressing: Total assistance,Bed level Toilet Transfer: Moderate assistance,Stand-pivot,BSC,RW Toilet Transfer Details (indicate cue type and reason): mod A to powerup, min A to control descent. Cues for sequencing and hand placement with rw. Repetition with verbal cues needed at times. Toileting- Clothing Manipulation and Hygiene: Total assistance,Bed level Toileting - Clothing Manipulation Details (indicate cue type and reason): Pt unable to properly clean self Functional mobility during ADLs: Minimal assistance,Cueing for safety,Cueing for sequencing,Rolling walker General ADL Comments: Pt completed bed mobility, managed urinal to void sitting EOB at min guard - min A level (posterior LOB, min A to correct).. took pivotal steps from EOB to recliner simulating toilet transfer to [facility]. Up to mod A needed.     Mobility   Overal bed mobility: Needs Assistance Bed Mobility: Supine to Sit Rolling: Supervision Sidelying to sit: Min assist Supine to sit: Min guard,HOB elevated Sit to supine: Supervision,HOB elevated Sit to sidelying: Min assist General bed mobility comments: Pt sitting up in recliner upon arrival, just completed his OT session.     Transfers   Overall transfer level: Needs assistance Equipment used: Rolling walker (2 wheeled) Transfers: Sit to/from Stand Sit to Stand: Min assist,+2 physical assistance,+2 safety/equipment Stand pivot transfers: Min assist,Min guard General transfer comment: MinAx1-2 and extra time to power up to stand, cuing for pt to scoot anteriorly and keep hands on recliner to push up to stand then transition 1 hand at a time to RW once buttocks is cleared, success but poor carryover. Cued pt to reach back for chair to control descent with return to sit, min success.     Ambulation / Gait / Stairs / Wheelchair Mobility   Ambulation/Gait Ambulation/Gait assistance: Min assist,+2 safety/equipment,+2 physical  assistance Gait Distance (Feet): 12 Feet (x2 bouts of ~8 ft > ~12 ft, seated rest break between bouts) Assistive device: Rolling walker (2 wheeled) Gait Pattern/deviations: Step-to pattern,Decreased stride length,Trunk flexed ("hop to")  General Gait Details: Slow, short hopping steps, relying heavily on hands on RW during swing phase of R leg. Cued pt to monitor himself and his fatigue and to appropriately return to sit to rest prior to complete LOB due to fatigue, with success but reminders throughout. MinAx1-2 with chair follow to maintain balance and safety. Gait velocity: decr Gait velocity interpretation: <1.31 ft/sec, indicative of household ambulator     Posture / Balance Dynamic Sitting Balance Sitting balance - Comments: close min guard-Min A for safety; posterior lean at times. Balance Overall balance assessment: Needs assistance Sitting-balance support: No upper extremity supported (foot supported) Sitting balance-Leahy Scale: Poor Sitting balance - Comments: close min guard-Min A for safety; posterior lean at times. Standing balance support: Bilateral upper extremity supported,During functional activity Standing balance-Leahy Scale: Poor Standing balance comment: Requires UE support and external support. High Level Balance Comments: with RW     Special needs/care consideration Wound Vac On L stump at surgical site, Skin Surgical incision, Blister, Cellulitis, Weeping of L L E.  and Designated visitor Blunt (from acute therapy documentation) Living Arrangements: Other relatives (cousin who cannot provide physical assist) Available Help at Discharge: Family,Available PRN/intermittently Type of Home: Mobile home Home Layout: One level Home Access: Ramped entrance Bathroom Shower/Tub: Government social research officer Accessibility: Yes Home Care Services: No Additional Comments: pt states his cousin is disabled and could  not provide any physical assist   Discharge Living Setting Plans for Discharge Living Setting: Other (Comment) Type of Home at Discharge: Mobile home Discharge Home Layout: One level Discharge Home Access: 2 steps to enter (can reach both railings) Discharge Bathroom Shower/Tub: Tub/shower unit Discharge Bathroom Toilet: Standard Discharge Bathroom Accessibility: Yes How Accessible: Accessible via walker (not wc) Does the patient have any problems obtaining your medications?: No *Pt's home is not wc accessible.    Social/Family/Support Systems Patient Roles: Other (Comment) Contact Information: [phone] Anticipated Caregiver: Semisi Biela (neice) Anticipated Caregiver's Contact Information: [phone] Ability/Limitations of Caregiver: Can provide intermittent supervision only Caregiver Availability: Intermittent Discharge Plan Discussed with Primary Caregiver: Yes Is Caregiver In Agreement with Plan?: Yes Does Caregiver/Family have Issues with Lodging/Transportation while Pt is in Rehab?: No   Goals Patient/Family Goal for Rehab: PT/OT: mod I/intermittant Supervision; SLP: NA Expected length of stay: 10-12 days Pt/Family Agrees to Admission and willing to participate: Yes Program Orientation Provided & Reviewed with Pt/Caregiver Including Roles  & Responsibilities: Yes   Decrease burden of Care through IP rehab admission: NA   Possible need for SNF placement upon discharge: not anticipated. Per pt and his niece, he will have intermittent supervision at DC. Anticipate he can achieve that level through CIR program for a safe DC home.   Patient Condition: I have reviewed medical records from [facility], spoken with CM, RN and MD and patient and family member. I met with patient at the bedside for inpatient rehabilitation assessment.  Patient will benefit from ongoing PT and OT, can actively participate in 3 hours of therapy a day 5 days of the week, and can  make measurable gains during the admission.  Patient will also benefit from the coordinated team approach during an Inpatient Acute Rehabilitation admission.  The patient will receive intensive therapy as well as Rehabilitation physician, nursing, social worker, and care management interventions.  Due to safety, skin/wound care, disease management, medication administration, pain management and patient education the patient requires 24 hour a day rehabilitation nursing.  The patient is currently min A +2  with mobility and Min A to Mod A for basic ADLs.  Discharge setting and therapy post discharge at home with home health is anticipated.  Patient has agreed to participate in the Acute Inpatient Rehabilitation Program and will admit 09/19/20.   Preadmission Screen Completed By:  Farley Ly Staley,09/16/2020 9:00 AM with day of admit updates provided by Raechel Ache OTR/L on 09/19/20 at 11:03AM. ______________________________________________________________________   Discussed status with Dr. Letta Pate on 09/19/20 at 11:21AM and received approval for admission today.   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 Does the need for close, 24 hr/day Medical supervision in concert with the patient's rehab needs make it unreasonable for this patient to be served in a less intensive setting? Yes 2. Co-Morbidities requiring supervision/potential complications: History of coronary artery disease status post CABG, history of aortic dissection with redo thoracic aortic repair in the past, A. fib on Eliquis, CKD 3, hypertension 3. Due to bladder management, bowel management, safety, skin/wound care, disease management, medication administration, pain management and patient education, does the patient require 24 hr/day rehab nursing? Yes 4. Does the  patient require coordinated care of a physician, rehab nurse, PT, OT, and SLP to address physical and functional deficits in the context of the above medical diagnosis(es)? Yes Addressing deficits in the following areas: balance, endurance, locomotion, strength, transferring, bowel/bladder control, bathing, dressing, grooming, toileting, cognition and psychosocial support 5. Can the patient actively participate in an intensive therapy program of at least 3 hrs of therapy 5 days a week? Yes 6. The potential for patient to make measurable gains while on inpatient rehab is good 7. Anticipated functional outcomes upon discharge from inpatient rehab: modified independent and supervision PT, modified independent and supervision OT, n/a SLP 8. Estimated rehab length of stay to reach the above functional goals is: 10 to 12 days 9. Anticipated discharge destination: Home 10. Overall Rehab/Functional Prognosis: excellent     MD Signature: Charlett Blake M.D. [employer] Fellow Am Acad of Phys Med and [place] Board of Electrodiagnostic Med Fellow Am Board of Interventional Pain            Revision History                                       Note Details  Author Charlett Blake, MD File Time 09/19/2020 11:40 AM  Author Type Physician Status Signed  Last Editor Charlett Blake, MD Service Physical Medicine and [place] # [id number] Admit Date 09/19/2020

## 2020-09-19 NOTE — Progress Notes (Signed)
Hematology: CBC    Component Value Date/Time   WBC 8.0 09/18/2020 1412   RBC 2.51 (L) 09/18/2020 1412   RBC 2.51 (L) 09/18/2020 1412   HGB 7.4 (L) 09/18/2020 1412   HGB 11.7 (L) 02/04/2020 1415   HCT 24.2 (L) 09/18/2020 1412   HCT 36.5 (L) 02/04/2020 1415   PLT 217 09/18/2020 1412   PLT 229 02/04/2020 1415   MCV 96.4 09/18/2020 1412   MCV 91 02/04/2020 1415   MCH 29.5 09/18/2020 1412   MCHC 30.6 09/18/2020 1412   RDW 16.8 (H) 09/18/2020 1412   RDW 13.4 02/04/2020 1415   LYMPHSABS 1.6 09/14/2020 0404   MONOABS 1.8 (H) 09/14/2020 0404   EOSABS 0.1 09/14/2020 0404   BASOSABS 0.0 09/14/2020 0404   Iron studies: Iron saturation 14%, TIBC 224, ferritin 625 (secondary to inflammation) Reticulocytes: 3.4%: Reticulocyte absolute: 85 Blood work seems to suggest that this bone marrow is starting to recover. His hemoglobin has stabilized at this time. I recommend watchful monitoring.

## 2020-09-19 NOTE — Progress Notes (Signed)
Inpatient Rehabilitation Medication Review by a Pharmacist  A complete drug regimen review was completed for this patient to identify any potential clinically significant medication issues.  Clinically significant medication issues were identified:  no  Check AMION for pharmacist assigned to patient if future medication questions/issues arise during this admission.  Pharmacist comments:   Time spent performing this drug regimen review (minutes):  5   Yailine Ballard 09/19/2020 3:00 PM

## 2020-09-19 NOTE — Discharge Summary (Signed)
Physician Discharge Summary  Connor [name] ZOX:[id number] DOB: 09-Jan-1956 DOA: 08/30/2020  PCP: Patient, No Pcp Per  Admit date: 08/30/2020 Discharge date: 09/19/2020  Admitted From: home Disposition:  CIR   Recommendations for Outpatient Follow-up:  1. Please follow Hb closely and transfuse as needed  Home Health:  none  Discharge Condition:  stable   CODE STATUS:  Full code   Diet recommendation:  Heart healthy with protein supplements Consultations:  Ortho  Cardiology  Nephrology  General surgery   ID  hematology  Procedures/Studies: . 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   Discharge Diagnoses:  Principal Problem:   Severe sepsis with acute organ dysfunction due to Streptococcus species [facility]) Active Problems:   AKI (acute kidney injury) (HCC)   CKD (chronic kidney disease), stage III (HCC)   Nausea vomiting and diarrhea   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)   Severe anemia   Persistent atrial fibrillation (HCC)   Hypertension   Cellulitis of left leg   Lactic acidosis   Elevated troponin   Hypoalbuminemia   Leukocytosis   Thrombocytopenia (HCC)   Hypokalemia   Hyperglycemia   Transaminitis   Total bilirubin, elevated   Morbid obesity (HCC)     Brief Summary: Connor [name] is a 65 y/o male with HTN, CKD 3, A-fib on Coumadin AAA, ascending aortic dissection s/p open rapair 2016 and redo on 2019, left command and external iliac stents 2020 complicated by cardiogenic shock and AKI requiring dialysis.   He presented to the ED with sepsis and rhabdomyolysis due to left leg cellulitis.  In ED > WBC 15, Lactic acid 2, CPK 28,000 and Cr of 4.2 (baseilne ~ 1.2- 1.4).  Hlater grew out strep group G in the blood. He improved overall, however, his leg did not seem to improved. On 1/12, he was ultimately taken for an AKA due to ongoing necrotizing fascitis of the leg.  His hospital course has been complicated by recurrent anemia requiring blood  transfusions.    Hospital Course:  Principal Problem: Severe Sepsis with acute renal failure without septic shock    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 - 1/14- underwent AKA of left leg as mentioned above- Dr Lajoyce Corners managing - Wound vac placed and then removed -  - completed Ceftriaxone on 1/21 - no signs of residual infection - pain control has been a major issue- he is now well controled with the following: Oyycontin 20 mg BID, Oxycodone - 5-10 mg QID routine, Neurontin from 400 mg TID   - He is functional at current doses without exhibiting signs of being oversedated or confused.   Active Problems:  Right leg pain - due to overuse in relation to left leg injuries/ infection - use Voltaren gel PRN with heating pad- this is helping  A-fib, paroxysmal - has been on Heparin due to the procedure- transitioned back to Eliquis  - cont Cardizem and Labetalol  - Nifedipine on hold  Anemia due to acute blood loss - I have reviewed has last anemia panel / folic acid a Labs (Brief)          Component Value Date/Time   IRON 79 09/06/2020 0441   TIBC 202 (L) 09/06/2020 0441   FERRITIN 1,090 (H) 09/06/2020 0441   IRONPCTSAT 39 09/06/2020 0441     - he has received 7 Unit of PRBC- we are closely following Hb  - Hb dropped to 6.5 on 1/23- stopped Eliquis and obtained CT of leg and a retroperitoneal CT - leg  CT did show a hematoma which is quite later however, ortho feel this is subacute and not acute and feels he is no longer bleeding - post transfusion Hb is 7.4   - I am assuming that his Hb drop is partly due to bone marrow superssion in relation to acute infection and stress in addition to acute blood loss - I did ask hematology to look in at the patient to be sure I am not missing a hematological cause for his anemia- Dr Pamelia Hoit feels that his hemoglobin should hopefully start to pick up soon - continue Eliquis       AKI on CKD 3a - Cr rose from 4.21 to 7.61 on  17 and subsequently has improved to about 1.6 - baseline Cr is 1.2-1.5 - cause was likely sepsis complicated by a prerenal state (vomiting prior to admission) and rhabdomyolysis  Grade 2 diastolic CHF - compensated - Spironolactone resumed - following volume status    Nausea vomiting and diarrhea - on admission- has resolved      Morbid obesity (HCC)  Body mass index is 35.02 kg/m.   Discharge Exam: Vitals:   09/18/20 2321 09/19/20 0505  BP: (!) 153/84 (!) 152/83  Pulse: (!) 56 61  Resp: 20 18  Temp: 98.8 F (37.1 C) 98.7 F (37.1 C)  SpO2: 93% 94%   Vitals:   09/18/20 1300 09/18/20 1807 09/18/20 2321 09/19/20 0505  BP: (!) 151/80 (!) 155/80 (!) 153/84 (!) 152/83  Pulse: 65 64 (!) 56 61  Resp: 19 20 20 18   Temp: 98.1 F (36.7 C) 97.9 F (36.6 C) 98.8 F (37.1 C) 98.7 F (37.1 C)  TempSrc: Oral Axillary Oral Oral  SpO2: 94% 94% 93% 94%  Weight:      Height:        General: Pt is alert, awake, not in acute distress Cardiovascular: RRR, S1/S2 +, no rubs, no gallops Respiratory: CTA bilaterally, no wheezing, no rhonchi Abdominal: Soft, NT, ND, bowel sounds + Extremities: no edema, no cyanosis   Discharge Instructions  Discharge Instructions    Diet - low sodium heart healthy   Complete by: As directed    Increase activity slowly   Complete by: As directed    No wound care   Complete by: As directed      Allergies as of 09/19/2020   No Known Allergies     Medication List    STOP taking these medications   aspirin EC 81 MG tablet   NIFEdipine 90 MG 24 hr tablet Commonly known as: ADALAT CC   valsartan 80 MG tablet Commonly known as: Diovan     TAKE these medications   diclofenac Sodium 1 % Gel Commonly known as: VOLTAREN Apply 2 g topically 4 (four) times daily.   diltiazem 240 MG 24 hr capsule Commonly known as: CARDIZEM CD Take 1 capsule (240 mg total) by mouth daily. Start taking on: September 20, 2020   Eliquis 5 MG Tabs  tablet Generic drug: apixaban Take 1 tablet (5 mg total) by mouth 2 (two) times daily.   feeding supplement Liqd Take 237 mLs by mouth 2 (two) times daily between meals.   gabapentin 400 MG capsule Commonly known as: NEURONTIN Take 1 capsule (400 mg total) by mouth 3 (three) times daily.   labetalol 200 MG tablet Commonly known as: NORMODYNE Take 2 tablets (400 mg total) by mouth 2 (two) times daily. What changed: how much to take   methocarbamol 500 MG tablet Commonly  known as: ROBAXIN Take 2 tablets (1,000 mg total) by mouth every 6 (six) hours as needed for muscle spasms.   multivitamin with minerals tablet Take 1 tablet by mouth daily. Men   oxyCODONE 5 MG immediate release tablet Commonly known as: Oxy IR/ROXICODONE Take 1-2 tablets (5-10 mg total) by mouth 4 (four) times daily.   oxyCODONE 20 mg 12 hr tablet Commonly known as: OXYCONTIN Take 1 tablet (20 mg total) by mouth every 12 (twelve) hours.   polyethylene glycol 17 g packet Commonly known as: MIRALAX / GLYCOLAX Take 17 g by mouth daily. Start taking on: September 20, 2020   spironolactone 25 MG tablet Commonly known as: ALDACTONE TAKE 1/2 TABLET(12.5 MG) BY MOUTH DAILY What changed: See the new instructions.       Follow-up Information    [facility], [place], [place] In 1 week.   Specialty: Orthopedic Surgery Contact information: [address] [place] [address] [phone]              No Known Allergies    US Abdomen Complete  Result Date: 09/01/2020 CLINICAL DATA:  Elevated liver enzymes. History of aortic aneurysm and dissection. EXAM: ABDOMEN ULTRASOUND COMPLETE COMPARISON:  None. FINDINGS: Gallbladder: Within the gallbladder, there are echogenic foci which move and shadow consistent with cholelithiasis. Largest gallstone measures 5 mm in length. There is no appreciable gallbladder wall thickening or pericholecystic fluid. No sonographic Murphy sign noted by sonographer. Common bile duct:  Diameter: 8 mm proximally, prominent. There is slight intrahepatic biliary duct dilatation. No biliary duct obstructing lesion is evident. Note that portions of the mid to distal common bile duct are obscured by gas. Liver: No focal lesion identified. Liver echogenicity overall is increased. Portal vein is patent on color Doppler imaging with normal direction of blood flow towards the liver. IVC: No abnormality visualized in portions that can be interrogated. Note that portions of the inferior vena cava obscured by gas. Pancreas: Most of the pancreas is obscured by gas. Spleen: Size and appearance within normal limits. Right Kidney: Length: 12.1 cm. Echogenicity within normal limits. No mass or hydronephrosis visualized. Left Kidney: Length: 12.8 cm. Echogenicity within normal limits. No mass or hydronephrosis visualized. Abdominal aorta: Proximal abdominal aorta measures 5.0 cm in diameter, stable from prior CT examination. Most of the remainder of the aorta is obscured by gas. The known dissection in this area is not appreciable on current sonographic evaluation. Other findings: No demonstrable ascites. IMPRESSION: 1. Cholelithiasis. No gallbladder wall thickening or pericholecystic fluid. 2. Prominence of the proximal common bile duct as well as mild intrahepatic biliary duct dilatation. No obstructing focus seen in the biliary ductal system. Note that portions of the mid to distal common bile duct obscured by gas. From an imaging standpoint, MRCP would be the optimum study of choice to further evaluate the biliary ductal system. 3. Proximal abdominal aortic aneurysm measuring approximately 5 cm in diameter, stable from prior CT examination. Most of the aorta is obscured by gas. The known dissection in this area of the aorta not appreciable by ultrasound. 4.  Most of pancreas and much of inferior vena cava obscured by gas. 5. Increase in liver echogenicity which may indicate hepatic steatosis with potential  underlying parenchymal liver disease. No focal liver lesions are demonstrable on this study. Electronically Signed   By: Bretta Bang III M.D.   On: 09/01/2020 10:49   MR FEMUR LEFT WO CONTRAST  Result Date: 09/01/2020 CLINICAL DATA:  Left lower extremity pain and  swelling. EXAM: MR OF THE LEFT FEMUR WITHOUT CONTRAST TECHNIQUE: Multiplanar, multisequence MR imaging of the left lower extremity was performed. No intravenous contrast was administered. COMPARISON:  CT scan 08/31/2020 FINDINGS: Both hips are normally located. Mild degenerative changes but no findings suspicious for septic arthritis or osteomyelitis. The left femur is intact. No evidence of osteomyelitis. Mild diffuse subcutaneous soft tissue swelling/edema/fluid suggesting cellulitis. Again demonstrated are enlarged and inflamed appearing left inguinal lymph nodes, likely a lymph adenitis. No discrete drainable subcutaneous soft tissue abscess is identified although study somewhat limited without IV contrast. Changes of myofasciitis mainly involving the medial compartment/adductor compartment muscles. No findings suspicious for pyomyositis. Similar findings involving the right upper thigh. Stable postoperative changes involving the left inguinal area with scar tissue. The major vascular structures demonstrate patent flow voids. Stable borderline enlarged left external iliac lymph node measuring 15.5 mm. No significant intrapelvic abnormalities are identified. IMPRESSION: 1. Mild diffuse subcutaneous soft tissue swelling/edema/fluid suggesting cellulitis. 2. Changes of myofasciitis mainly involving the medial compartment/adductor compartment muscles. No evidence of pyomyositis. 3. No findings suspicious for septic arthritis or osteomyelitis. 4. Stable enlarged and inflamed appearing left inguinal and left external iliac lymph nodes. Electronically Signed   By: Rudie Meyer M.D.   On: 09/01/2020 09:38   US Venous Img Lower  Left (DVT  Study)  Result Date: 08/30/2020 CLINICAL DATA:  Pain and edema EXAM: LEFT LOWER EXTREMITY VENOUS DOPPLER ULTRASOUND TECHNIQUE: Gray-scale sonography with compression, as well as color and duplex ultrasound, were performed to evaluate the deep venous system(s) from the level of the common femoral vein through the popliteal and proximal calf veins. COMPARISON:  None. FINDINGS: VENOUS Normal compressibility of the common femoral, superficial femoral, and popliteal veins. The calf veins were not well visualized. Visualized portions of profunda femoral vein and great saphenous vein unremarkable. No filling defects to suggest DVT on grayscale or color Doppler imaging. Doppler waveforms show normal direction of venous flow, normal respiratory plasticity and response to augmentation. Limited views of the contralateral common femoral vein are unremarkable. OTHER There is an abnormally enlarged lymph node in the left inguinal region measuring 3.6 x 1.7 x 3.3 cm (previously measuring approximately 1.8 x 1.9 cm on the patient's CT from March 01, 2020). This lymph node does not appear to demonstrate a normal fatty hilum. Limitations: none IMPRESSION: 1. No DVT, however the calf veins were not well visualized on this study. 2. There is an abnormally enlarged lymph node in the left inguinal region measuring 3.6 x 1.7 x 3.3 cm (previously measuring 1.9 x 1.8 cm on the patient's prior CT). While this may represent a reactive lymph node, a normal fatty hilum is not visualized. As such, underlying malignancy cannot be excluded. Short interval follow-up ultrasound or outpatient percutaneous biopsy is recommended. Electronically Signed   By: Katherine Mantle M.D.   On: 08/30/2020 18:23   DG Chest Port 1 View  Result Date: 08/30/2020 CLINICAL DATA:  Fever, recent food poisoning EXAM: PORTABLE CHEST 1 VIEW COMPARISON:  03/01/2020, 06/19/2019 FINDINGS: Single frontal view of the chest demonstrates stable enlarged cardiac silhouette.  Stable enlargement of the aortic arch, with underlying stent, compatible with repair of prior thoracic aortic dissection. Progressive decrease in size of the aortic arch consistent with thrombosis of the false lumen. No acute airspace disease, effusion, or pneumothorax. No acute bony abnormalities. IMPRESSION: 1. 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 2.  No acute intrathoracic process. Electronically Signed   By: Sharlet Salina M.D.   On: 08/30/2020 19:29   ECHOCARDIOGRAM COMPLETE  Result Date: 09/01/2020    ECHOCARDIOGRAM REPORT   Patient Name:   HAYDYN GIRVAN Date of Exam: 09/01/2020 Medical Rec #:  [id number]     Height:       71.0 in Accession #:    [id number]    Weight:       253.1 lb Date of Birth:  Jan 26, 1956    BSA:          2.330 m Patient Age:    64 years      BP:           93/60 mmHg Patient Gender: M             HR:           83 bpm. [facility] Procedure: 2D Echo, Cardiac Doppler and Color Doppler Indications:    Bacteremia R78.81  History:        Patient has prior history of Echocardiogram examinations, most                 recent 10/02/2019. Arrythmias:Atrial Fibrillation; Risk                 Factors:Hypertension. History of open heart surgery (From Hx),                 History of aortic dissection s/p repair in 2016.  Sonographer:    Celesta Gentile RCS Referring Phys: [id number] CHRISTOPHER P DANFORD IMPRESSIONS  1. Left ventricular ejection fraction, by estimation, is 60 to 65%. The left ventricle has normal function. The left ventricle has no regional wall motion abnormalities. There is severe left ventricular hypertrophy. Left ventricular diastolic parameters  are consistent with Grade II diastolic dysfunction (pseudonormalization).  2. Right ventricular systolic function is normal. The right ventricular size is normal. There is normal pulmonary artery systolic pressure. The  estimated right ventricular systolic pressure is 29.6 mmHg.  3. Left atrial size was severely dilated.  4. Right atrial size was moderately dilated.  5. The mitral valve is grossly normal. Trivial mitral valve regurgitation.  6. The aortic valve is tricuspid. Aortic valve regurgitation is not visualized.  7. The inferior vena cava is normal in size with greater than 50% respiratory variability, suggesting right atrial pressure of 3 mmHg. FINDINGS  Left Ventricle: Left ventricular ejection fraction, by estimation, is 60 to 65%. The left ventricle has normal function. The left ventricle has no regional wall motion abnormalities. The left ventricular internal cavity size was normal in size. There is  severe left ventricular hypertrophy. Left ventricular diastolic parameters are consistent with Grade II diastolic dysfunction (pseudonormalization). Right Ventricle: The right ventricular size is normal. No increase in right ventricular wall thickness. Right ventricular systolic function is normal. There is normal pulmonary artery systolic pressure. The tricuspid regurgitant velocity is 2.58 m/s, and  with an assumed right atrial pressure of 3 mmHg, the estimated right ventricular systolic pressure is 29.6 mmHg. Left Atrium: Left atrial size was severely dilated. Right Atrium: Right atrial size was moderately dilated. Pericardium: There is no evidence of pericardial effusion. Mitral Valve: The mitral valve is grossly normal. Trivial mitral valve regurgitation. Tricuspid Valve: The tricuspid valve is grossly normal. Tricuspid valve regurgitation is trivial. Aortic Valve: The aortic valve is tricuspid. Aortic valve regurgitation is not visualized. Pulmonic Valve: The pulmonic valve was grossly normal. Pulmonic valve regurgitation is  trivial. Aorta: The aortic root is normal in size and structure. Venous: The inferior vena cava is normal in size with greater than 50% respiratory variability, suggesting right atrial pressure  of 3 mmHg. IAS/Shunts: No atrial level shunt detected by color flow Doppler.  LEFT VENTRICLE PLAX 2D LVIDd:         4.60 cm  Diastology LVIDs:         2.90 cm  LV e' medial:    7.94 cm/s LV PW:         1.70 cm  LV E/e' medial:  17.9 LV IVS:        1.80 cm  LV e' lateral:   12.20 cm/s LVOT diam:     2.00 cm  LV E/e' lateral: 11.6 LV SV:         95 LV SV Index:   41 LVOT Area:     3.14 cm  RIGHT VENTRICLE RV S prime:     15.00 cm/s TAPSE (M-mode): 2.2 cm LEFT ATRIUM              Index       RIGHT ATRIUM           Index LA diam:        4.70 cm  2.02 cm/m  RA Area:     28.20 cm LA Vol (A2C):   100.0 ml 42.91 ml/m RA Volume:   95.10 ml  40.81 ml/m LA Vol (A4C):   127.0 ml 54.50 ml/m LA Biplane Vol: 115.0 ml 49.35 ml/m  AORTIC VALVE LVOT Vmax:   144.00 cm/s LVOT Vmean:  106.000 cm/s LVOT VTI:    0.301 m  AORTA Ao Root diam: 3.30 cm MITRAL VALVE                TRICUSPID VALVE MV Area (PHT): 3.05 cm     TR Peak grad:   26.6 mmHg MV Decel Time: 249 msec     TR Vmax:        258.00 cm/s MV E velocity: 142.00 cm/s MV A velocity: 50.90 cm/s   SHUNTS MV E/A ratio:  2.79         Systemic VTI:  0.30 m                             Systemic Diam: 2.00 cm Nona Dell MD Electronically signed by Nona Dell MD Signature Date/Time: 09/01/2020/4:23:11 PM    Final    ECHO TEE  Result Date: 09/05/2020    TRANSESOPHOGEAL ECHO REPORT   Patient Name:   NORVAL SLAVEN Date of Exam: 09/05/2020 Medical Rec #:  [id number]     Height:       71.0 in Accession #:    [id number]    Weight:       247.8 lb Date of Birth:  [birth year]    BSA:          2.310 m Patient Age:    64 years      BP:           134/61 mmHg Patient Gender: M             HR:           82 bpm. Exam Location:  Inpatient Procedure: Transesophageal Echo, Cardiac Doppler and Color Doppler Indications:     Bacteremia  History:         Patient has prior history of Echocardiogram examinations, most  recent 09/01/2020. Cardiomyopathy, Arrythmias:Atrial                   Fibrillation, Signs/Symptoms:Fever and Bacteremia; Risk                  Factors:Hypertension. Aortic dissection repair.  Sonographer:     Lavenia Atlas Referring Phys:  [id number] Ellsworth Lennox Diagnosing Phys: Laurance Flatten MD PROCEDURE: The transesophogeal probe was passed without difficulty through the esophogus of the patient. Sedation performed by different physician. The patient was monitored while under deep sedation. Anesthestetic sedation was provided intravenously by Anesthesiology: 346.95mg  of Propofol, 50mg  of Lidocaine. The patient developed no complications during the procedure. IMPRESSIONS  1. Left ventricular ejection fraction, by estimation, is 60 to 65%. The left ventricle has normal function.  2. Right ventricular systolic function is normal. The right ventricular size is normal. Pulmonary artery systolic pressure is 50mmHg+ RAP.  3. Left atrial size was severely dilated. No left atrial/left atrial appendage thrombus was detected.  4. The mitral valve is normal in structure. Trivial mitral valve regurgitation.  5. The aortic valve is tricuspid. Aortic valve regurgitation is trivial. No aortic stenosis is present.  6. Agitated saline contrast bubble study was positive with shunting observed after >6 cardiac cycles suggestive of intrapulmonary shunting.  7. Right atrial size was severely dilated.  8. No valvular vegetations visualized. FINDINGS  Left Ventricle: Left ventricular ejection fraction, by estimation, is 60 to 65%. The left ventricle has normal function. The left ventricular internal cavity size was normal in size. Right Ventricle: The right ventricular size is normal. No increase in right ventricular wall thickness. Right ventricular systolic function is normal. Pulmonary artery systolic pressure is 66mmHg+RAP. Left Atrium: Left atrial size was severely dilated. No left atrial/left atrial appendage thrombus was detected. Right Atrium: Right atrial size was severely  dilated. Pericardium: There is no evidence of pericardial effusion. Mitral Valve: The mitral valve is normal in structure. There is mild thickening of the mitral valve leaflet(s). Trivial mitral valve regurgitation. There is no evidence of mitral valve vegetation. Tricuspid Valve: The tricuspid valve is normal in structure. Tricuspid valve regurgitation is mild. There is no evidence of tricuspid valve vegetation. Aortic Valve: The aortic valve is tricuspid. Aortic valve regurgitation is trivial. No aortic stenosis is present. There is no evidence of aortic valve vegetation. Pulmonic Valve: The pulmonic valve was normal in structure. Pulmonic valve regurgitation is trivial. There is no evidence of pulmonic valve vegetation. Aorta: The aortic root and ascending aorta are structurally normal, with no evidence of dilitation. IAS/Shunts: No atrial level shunt detected by color flow Doppler. Agitated saline contrast bubble study was positive with shunting observed after >6 cardiac cycles suggestive of intrapulmonary shunting.  TRICUSPID VALVE TR Peak grad:   34.1 mmHg TR Vmax:        292.00 cm/s Laurance Flatten MD Electronically signed by Laurance Flatten MD Signature Date/Time: 09/05/2020/3:50:11 PM    Final    CT EXTREMITY LOWER LEFT WO CONTRAST  Result Date: 09/17/2020 CLINICAL DATA:  Sepsis.  Soft tissue infection suspected EXAM: CT OF THE LOWER LEFT EXTREMITY WITHOUT CONTRAST CT OF THE LOWER RIGHT EXTREMITY WITHOUT CONTRAST TECHNIQUE: Multidetector CT imaging of the lower left extremity and lower right extremity was performed according to the standard protocol. 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 No erosion or periostitis at the resection margin to suggest osteomyelitis. No acute fracture or malalignment. No  evidence of femoral head avascular necrosis. Ligaments Suboptimally assessed by CT. Muscles and Tendons  Postsurgical changes to the left thigh musculature. No acute tendinous injury. Soft tissues There is an ill-defined mixed density fluid collection within the soft tissues at the distal stump laterally. There are mixed areas of high attenuation fluid with areas of more decreased attenuation as well as multiple locules of soft tissue gas. Approximate measurements of the collection are 10.3 cm AP x 5.3 cm TR x 12.4 cm CC (series 6, image 266; series 9, image 103). Collection approximates the surgical incision site as evident by overlying skin staples. No foci of gas within the soft tissues tracking beyond the site of fluid collection. Subcutaneous edema within the left thigh most pronounced anteriorly at the level of the hip. No deep fascial fluid collection. Multiple mildly enlarged left inguinal lymph nodes are again seen. Subcutaneous edema throughout the right lower extremity most pronounced along the lateral aspect of the knee and calf. No organized fluid collection. No soft tissue gas within the right lower extremity. No deep fascial fluid collections are evident. For findings within the pelvis. Please see concurrently obtained dedicated CT of the chest, abdomen, and pelvis. IMPRESSION: 1. Ill-defined mixed density fluid collection within the soft tissues at the distal stump laterally measuring approximately 12.4 x 5.3 x 10.3 cm. Findings are suggestive of hematoma. There are multiple foci of air within the collection, which may be related to recent surgery. Superimposed infected collection not excluded. 2. No evidence of osteomyelitis by CT. 3. Nonspecific subcutaneous edema within the left thigh and throughout the right lower extremity, which may reflect cellulitis. No additional organized fluid collection. 4. Multiple mildly enlarged left inguinal lymph nodes, likely reactive. 5. For findings within the pelvis. Please see concurrently obtained dedicated CT of the chest, abdomen, and pelvis. Electronically  Signed   By: Duanne GuessNicholas  Plundo D.O.   On: 09/17/2020 13:26   CT EXTREMITY LOWER LEFT WO CONTRAST  Result Date: 08/31/2020 CLINICAL DATA:  Edema and swelling over left lower extremity EXAM: CT OF THE LOWER LEFT EXTREMITY WITHOUT CONTRAST TECHNIQUE: Multidetector CT imaging of the lower left extremity was performed according to the standard protocol. COMPARISON:  Ultrasound 08/30/2020, CT angiography 03/01/2020, 06/19/2019 FINDINGS: Bones/Joint/Cartilage No fracture or malalignment. No periostitis or bony destructive change. No significant hip or knee effusion is visualized. Ligaments Suboptimally assessed by CT. Muscles and Tendons No significant atrophy.  No intramuscular fluid collections. Soft tissues Mild vascular calcifications. Focal distortion within the left groin with surgical clips, unchanged and presumably due to surgical scarring. Multiple enlarged left inguinal lymph nodes, the largest measures 3.01 2.7 by 2.5 cm and corresponds to the ultrasound demonstrated abnormal lymph node. Moderate subcutaneous edema and fluid within the left lower extremity extending from the hip to the imaged proximal lower leg. Edema most heavily concentrated at the distal thigh and knee. Skin thickening and edematous infiltration of subcutaneous fat of the anterior, medial, and lateral thigh with small amount of fluid superficial to the lateral quadriceps muscles. No gas containing fluid collections. IMPRESSION: 1. Moderate subcutaneous edema within the left lower extremity extending from the hip to the imaged proximal lower leg, possible cellulitis or nonspecific lower extremity edema. No gas containing fluid collections are seen to suggest soft tissue abscess. Note that assessment for abscess is limited without contrast. There is no acute osseous abnormality. 2. Multiple enlarged left inguinal lymph nodes, the largest measuring up to 3 cm, corresponding to the ultrasound demonstrated abnormal lymph node  on recent  ultrasound. Findings are indeterminate for reactive adenopathy or malignant adenopathy. Tissue sampling was previously suggested. 3. No acute osseous abnormality. Electronically Signed   By: Jasmine Pang M.D.   On: 08/31/2020 21:18   CT EXTREMITY LOWER RIGHT WO CONTRAST  Result Date: 09/17/2020 CLINICAL DATA:  Sepsis.  Soft tissue infection suspected EXAM: CT OF THE LOWER LEFT EXTREMITY WITHOUT CONTRAST CT OF THE LOWER RIGHT EXTREMITY WITHOUT CONTRAST TECHNIQUE: Multidetector CT imaging of the lower left extremity and lower right extremity was performed according to the standard protocol. 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 No erosion or periostitis at the resection margin to suggest osteomyelitis. No acute fracture or malalignment. No evidence of femoral head avascular necrosis. Ligaments Suboptimally assessed by CT. Muscles and Tendons Postsurgical changes to the left thigh musculature. No acute tendinous injury. Soft tissues There is an ill-defined mixed density fluid collection within the soft tissues at the distal stump laterally. There are mixed areas of high attenuation fluid with areas of more decreased attenuation as well as multiple locules of soft tissue gas. Approximate measurements of the collection are 10.3 cm AP x 5.3 cm TR x 12.4 cm CC (series 6, image 266; series 9, image 103). Collection approximates the surgical incision site as evident by overlying skin staples. No foci of gas within the soft tissues tracking beyond the site of fluid collection. Subcutaneous edema within the left thigh most pronounced anteriorly at the level of the hip. No deep fascial fluid collection. Multiple mildly enlarged left inguinal lymph nodes are again seen. Subcutaneous edema throughout the right lower extremity most pronounced along the lateral aspect of the knee and calf. No organized fluid collection. No soft  tissue gas within the right lower extremity. No deep fascial fluid collections are evident. For findings within the pelvis. Please see concurrently obtained dedicated CT of the chest, abdomen, and pelvis. IMPRESSION: 1. Ill-defined mixed density fluid collection within the soft tissues at the distal stump laterally measuring approximately 12.4 x 5.3 x 10.3 cm. Findings are suggestive of hematoma. There are multiple foci of air within the collection, which may be related to recent surgery. Superimposed infected collection not excluded. 2. No evidence of osteomyelitis by CT. 3. Nonspecific subcutaneous edema within the left thigh and throughout the right lower extremity, which may reflect cellulitis. No additional organized fluid collection. 4. Multiple mildly enlarged left inguinal lymph nodes, likely reactive. 5. For findings within the pelvis. Please see concurrently obtained dedicated CT of the chest, abdomen, and pelvis. Electronically Signed   By: Duanne Guess D.O.   On: 09/17/2020 13:26   CT CHEST ABDOMEN PELVIS WO CONTRAST  Result Date: 09/17/2020 CLINICAL DATA:  Suspected retroperitoneal hematoma . Anemia. Severe sepsis and acute renal failure. Necrotizing fasciitis. 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 EXAM: CT CHEST, ABDOMEN AND PELVIS WITHOUT CONTRAST TECHNIQUE: Multidetector CT imaging of the chest, abdomen and pelvis was performed following the standard protocol without IV contrast. COMPARISON:  09/01/2020 abdominal ultrasound. 03/01/2020 CTA of the chest, abdomen, and pelvis. FINDINGS: CT CHEST FINDINGS Cardiovascular: Mild motion degradation. Status post ascending aortic repair and transverse aortic stent graft. The descending thoracic aorta measures on the order of 5.3 cm on 39/3, similar to on the prior. The dissection is not well evaluated on this noncontrast exam. Tortuous descending thoracic aorta. Moderate cardiomegaly, without pericardial effusion. Lad coronary artery  calcification. Pulmonary artery enlargement, outflow tract 3.5  cm. Mediastinum/Nodes: Right chest wall surgical clips. Right paratracheal node of 1.7 cm on 27/3, similar. Hilar regions poorly evaluated without intravenous contrast. Lungs/Pleura: Trace bilateral pleural fluid, new since prior CT. Left base scarring or subsegmental atelectasis. Posterior left upper lobe dependent atelectasis as well. Musculoskeletal: Marked bilateral gynecomastia. Prior median sternotomy. No acute osseous abnormality. CT ABDOMEN PELVIS FINDINGS Hepatobiliary: Hepatomegaly at 21.4 cm craniocaudal. Dependent gallstones. No acute cholecystitis or biliary duct dilatation. Pancreas: Normal, without mass or ductal dilatation. Spleen: Normal in size, without focal abnormality. Adrenals/Urinary Tract: Normal adrenal glands. No renal calculi or hydronephrosis. No hydroureter or ureteric calculi. No bladder calculi. Stomach/Bowel: Normal stomach, without wall thickening. Colonic stool burden suggests constipation. Normal terminal ileum and appendix. Normal small bowel. Vascular/Lymphatic: Left renal artery stent. Left common and external iliac artery stents. No pelvic or abdominal retroperitoneal hemorrhage/hematoma. No abdominopelvic adenopathy. Reproductive: Normal prostate. Other: No significant free fluid. No free intraperitoneal air. Extensive subcutaneous edema throughout the proximal left lower extremity. Intramuscular edema and fluid are incompletely imaged, including on 136/3. Musculoskeletal: No acute osseous abnormality. IMPRESSION: 1. Subcutaneous and intramuscular edema/fluid throughout the imaged proximal left lower extremity. This will be more completely evaluated on dedicated extremity CT, dictated separately. 2. No pelvic or abdominal retroperitoneal hematoma identified. 3. Similar appearance of the thoracic aorta, status post ascending aortic repair and transverse aortic stent graft. 4. Aortic atherosclerosis (ICD10-I70.0) and  emphysema (ICD10-J43.9). 5. Tiny bilateral pleural effusions. 6. Cholelithiasis. 7.  Possible constipation. 8. Pulmonary artery enlargement suggests pulmonary arterial hypertension. 9. Mild thoracic adenopathy, favored to be reactive. 10. Bilateral marked gynecomastia. Electronically Signed   By: Jeronimo Greaves M.D.   On: 09/17/2020 13:26     The results of significant diagnostics from this hospitalization (including imaging, microbiology, ancillary and laboratory) are listed below for reference.     Microbiology: No results found for this or any previous visit (from the past 240 hour(s)).   Labs: BNP (last 3 results) No results for input(s): BNP in the last 8760 hours. Basic Metabolic Panel: Recent Labs  Lab 09/13/20 0352 09/14/20 0404 09/16/20 1427 09/18/20 0837  NA 141 140 141 141  K 4.4 4.4 4.9 4.3  CL 107 107 106 106  CO2 25 23 27 26   GLUCOSE 103* 96 129* 93  BUN 21 24* 24* 18  CREATININE 1.48* 1.70* 1.61* 1.45*  CALCIUM 7.9* 7.8* 8.1* 8.0*  MG 1.7 1.8  --   --   PHOS 3.5 3.7  --   --    Liver Function Tests: Recent Labs  Lab 09/13/20 0352 09/14/20 0404 09/16/20 1427  AST 22 21 35  ALT 17 15 25   ALKPHOS 63 63 124  BILITOT 0.8 1.1 1.0  PROT 6.7 5.9* 6.4*  ALBUMIN 2.4* 2.2* 2.0*   No results for input(s): LIPASE, AMYLASE in the last 168 hours. No results for input(s): AMMONIA in the last 168 hours. CBC: Recent Labs  Lab 09/13/20 0352 09/14/20 0404 09/16/20 1132 09/16/20 1427 09/17/20 1335 09/17/20 2022 09/18/20 0434 09/18/20 0837 09/18/20 1412  WBC 14.7* 14.9* 8.1 8.3  --   --   --  7.3 8.0  NEUTROABS 11.5* 11.3*  --   --   --   --   --   --   --   HGB 8.1* 7.2* 6.2* 6.1* 6.5* 6.5* 7.1* 7.2* 7.4*  HCT 25.2* 22.7* 20.6* 19.6* 21.5* 20.5* 22.8* 22.6* 24.2*  MCV 97.3 98.3 100.0 99.0  --   --   --  95.4  96.4  PLT 314 273 248 225  --   --   --  231 217   Cardiac Enzymes: Recent Labs  Lab 09/13/20 0352 09/14/20 0404 09/15/20 0258  CKTOTAL 307 238 202    BNP: Invalid input(s): POCBNP CBG: No results for input(s): GLUCAP in the last 168 hours. D-Dimer No results for input(s): DDIMER in the last 72 hours. Hgb A1c No results for input(s): HGBA1C in the last 72 hours. Lipid Profile No results for input(s): CHOL, HDL, LDLCALC, TRIG, CHOLHDL, LDLDIRECT in the last 72 hours. Thyroid function studies No results for input(s): TSH, T4TOTAL, T3FREE, THYROIDAB in the last 72 hours.  Invalid input(s): FREET3 Anemia work up Recent Labs    09/18/20 1412  FERRITIN 625*  TIBC 224*  IRON 32*  RETICCTPCT 3.4*   Urinalysis    Component Value Date/Time   COLORURINE AMBER (A) 08/31/2020 0130   APPEARANCEUR CLOUDY (A) 08/31/2020 0130   LABSPEC 1.017 08/31/2020 0130   PHURINE 5.0 08/31/2020 0130   GLUCOSEU NEGATIVE 08/31/2020 0130   HGBUR LARGE (A) 08/31/2020 0130   BILIRUBINUR NEGATIVE 08/31/2020 0130   KETONESUR NEGATIVE 08/31/2020 0130   PROTEINUR 100 (A) 08/31/2020 0130   NITRITE NEGATIVE 08/31/2020 0130   LEUKOCYTESUR NEGATIVE 08/31/2020 0130   Sepsis Labs Invalid input(s): PROCALCITONIN,  WBC,  LACTICIDVEN Microbiology No results found for this or any previous visit (from the past 240 hour(s)).   Time coordinating discharge in minutes: 65  SIGNED:   Calvert CantorSaima Antonio Woodhams, MD  [facility] 09/19/2020, 11:28 AM

## 2020-09-19 NOTE — Progress Notes (Addendum)
Inpatient Rehabilitation-Admissions Coordinator   Met with pt bedside to notify him bed offer in CIR and he has accepted. Pt also aware he will be going into a semi-private room. Reviewed insurance benefit letter and consent forms. All questions answered. Received medical clearance from Dr. Wynelle Cleveland for admit to CIR today.   RN and [facility] team notified of plan for today.  Raechel Ache, OTR/L  Rehab Admissions Coordinator  [phone] 09/19/2020 11:27 AM

## 2020-09-20 ENCOUNTER — Inpatient Hospital Stay (HOSPITAL_COMMUNITY): Payer: Medicaid Other | Admitting: Physical Therapy

## 2020-09-20 ENCOUNTER — Inpatient Hospital Stay (HOSPITAL_COMMUNITY): Payer: Medicaid Other | Admitting: Occupational Therapy

## 2020-09-20 DIAGNOSIS — Z89612 Acquired absence of left leg above knee: Secondary | ICD-10-CM | POA: Diagnosis not present

## 2020-09-20 LAB — CBC WITH DIFFERENTIAL/PLATELET
Abs Immature Granulocytes: 0.03 10*3/uL (ref 0.00–0.07)
Basophils Absolute: 0.1 10*3/uL (ref 0.0–0.1)
Basophils Relative: 1 %
Eosinophils Absolute: 0.3 10*3/uL (ref 0.0–0.5)
Eosinophils Relative: 5 %
HCT: 23.8 % — ABNORMAL LOW (ref 39.0–52.0)
Hemoglobin: 7.5 g/dL — ABNORMAL LOW (ref 13.0–17.0)
Immature Granulocytes: 0 %
Lymphocytes Relative: 15 %
Lymphs Abs: 1.1 10*3/uL (ref 0.7–4.0)
MCH: 30.2 pg (ref 26.0–34.0)
MCHC: 31.5 g/dL (ref 30.0–36.0)
MCV: 96 fL (ref 80.0–100.0)
Monocytes Absolute: 0.7 10*3/uL (ref 0.1–1.0)
Monocytes Relative: 10 %
Neutro Abs: 5 10*3/uL (ref 1.7–7.7)
Neutrophils Relative %: 69 %
Platelets: 239 10*3/uL (ref 150–400)
RBC: 2.48 MIL/uL — ABNORMAL LOW (ref 4.22–5.81)
RDW: 16.6 % — ABNORMAL HIGH (ref 11.5–15.5)
WBC: 7.2 10*3/uL (ref 4.0–10.5)
nRBC: 0 % (ref 0.0–0.2)

## 2020-09-20 LAB — COMPREHENSIVE METABOLIC PANEL
ALT: 18 U/L (ref 0–44)
AST: 17 U/L (ref 15–41)
Albumin: 2.2 g/dL — ABNORMAL LOW (ref 3.5–5.0)
Alkaline Phosphatase: 106 U/L (ref 38–126)
Anion gap: 11 (ref 5–15)
BUN: 16 mg/dL (ref 8–23)
CO2: 24 mmol/L (ref 22–32)
Calcium: 8.5 mg/dL — ABNORMAL LOW (ref 8.9–10.3)
Chloride: 104 mmol/L (ref 98–111)
Creatinine, Ser: 1.41 mg/dL — ABNORMAL HIGH (ref 0.61–1.24)
GFR, Estimated: 56 mL/min — ABNORMAL LOW (ref >60)
Glucose, Bld: 90 mg/dL (ref 70–99)
Potassium: 4.5 mmol/L (ref 3.5–5.1)
Sodium: 139 mmol/L (ref 135–145)
Total Bilirubin: 1 mg/dL (ref 0.3–1.2)
Total Protein: 6.7 g/dL (ref 6.5–8.1)

## 2020-09-20 MED ORDER — DULOXETINE HCL 60 MG PO CPEP
60.0000 mg | ORAL_CAPSULE | Freq: Every day | ORAL | Status: AC
  Administered 2020-09-24 – 2020-09-27 (×4): 60 mg via ORAL
  Filled 2020-09-20 (×4): qty 1

## 2020-09-20 MED ORDER — DULOXETINE HCL 30 MG PO CPEP
30.0000 mg | ORAL_CAPSULE | Freq: Every day | ORAL | Status: AC
  Administered 2020-09-20 – 2020-09-23 (×4): 30 mg via ORAL
  Filled 2020-09-20 (×4): qty 1

## 2020-09-20 MED ORDER — HYDROCERIN EX CREA
TOPICAL_CREAM | Freq: Two times a day (BID) | CUTANEOUS | Status: DC
  Administered 2020-09-20 – 2020-09-26 (×3): 1 via TOPICAL
  Filled 2020-09-20: qty 113

## 2020-09-20 NOTE — Evaluation (Signed)
Occupational Therapy Assessment and Plan  Patient Details  Name: Connor [name] MRN: [id number] Date of Birth: 1956/05/02  OT Diagnosis: abnormal posture, acute pain, muscle weakness (generalized) and new L AKA Rehab Potential:   ELOS: 10-14   Today's Date: 09/20/2020 OT Individual Time: 8676-1950 OT Individual Time Calculation (min): 55 min     Hospital Problem: Principal Problem:   History of left above knee amputation ([place]) Active Problems:   Left above-knee amputee [facility])   Past Medical History:  Past Medical History:  Diagnosis Date  . AAA (abdominal aortic aneurysm) ([place])   . History of open heart surgery   . Hypertension   . Seroma due to trauma [facility])    Past Surgical History:  Past Surgical History:  Procedure Laterality Date  . ABDOMINAL AORTIC ANEURYSM REPAIR    . AMPUTATION Left 09/07/2020   Procedure: ATTEMPTED LEFT LEG DEBRIDEMENT FASCIOTOMIES, APPLY INSTILLATION WOUND VAC, ABOVE KNEE AMPUTATION;  Surgeon: Newt Minion, MD;  Location: [place];  Service: Orthopedics;  Laterality: Left;  . BUBBLE STUDY  09/05/2020   Procedure: BUBBLE STUDY;  Surgeon: Freada Bergeron, MD;  Location: [place];  Service: Cardiovascular;;  . CARDIOVERSION N/A 02/11/2020   Procedure: CARDIOVERSION;  Surgeon: Donato Heinz, MD;  Location: [facility] ENDOSCOPY;  Service: Cardiovascular;  Laterality: N/A;  . CORONARY ARTERY BYPASS GRAFT    . STUMP REVISION Left 09/09/2020   Procedure: REVISION LEFT ABOVE KNEE AMPUTATION;  Surgeon: Newt Minion, MD;  Location: [place];  Service: Orthopedics;  Laterality: Left;  . TEE WITHOUT CARDIOVERSION N/A 09/05/2020   Procedure: TRANSESOPHAGEAL ECHOCARDIOGRAM (TEE);  Surgeon: Freada Bergeron, MD;  Location: [facility] ENDOSCOPY;  Service: Cardiovascular;  Laterality: N/A;    Assessment & Plan Clinical Impression: Connor [name] is a 65 year old right-handed male with history of aortic dissection with several complicated surgery including redo  thoracic aortic repair as well as sternal infection, atrial fibrillation on Eliquis, CAD with CABG, CKD stage II-3, hypertension, obesity. Per chart review patient lives with his autistic cousinin a mobile home ramped entrance. Independent with assistive device. Community ambulator with straight point cane and driving. . Presented 08/30/2020 with persistent nausea vomiting diarrhea x5 days. In the ED noted fever 102.7 he was tachycardic. Chemistry showed a potassium 2.8 glucose 162 BUN 55 creatinine 4.21 from baseline of 1.38, WBC 15,600, hemoglobin 10.5, platelets 136,000, lactic acid 2.1 troponin 425-482, blood cultures GPC x2 Streptococcus urine cultureE Faecalis, CK 28,083.Marland Kitchen Chest x-ray showed no acute intrathoracic process. Patient noted edema and swelling of the left lower extremity. CT of left lower extremity showed no fracture or malalignment. No significant hip or knee effusion however there was moderate subcutaneous edema within the left lower extremity extending from the hip to the imaged proximal lower leg possibly representing cellulitis or nonspecific lower extremity edema. No gas containing fluid collections. Multiple enlarged left inguinal lymph nodes the largest measuring up to 3 cm. Nephrology consulted for AKI on CKD with rhabdomyolysis and felt AKI likely secondary to acute tubular injury decreased renal perfusion related to hypotension. Placed on gentle IV fluids and renal functions much improved with latest creatinine 1.45 and latest CK 202.Marland Kitchen Patient had been maintained on broad-spectrum antibiotics for suspected sepsis/cellulitis. Orthopedic service follow-up Dr. 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 Hospital course patient required multiple transfusions for anemia. His chronic anticoagulation was resumed. Hematology service Dr. Lindi Adie consulted in  regards to anemia felt to be multifactorial and work-up with iron studies and reticulocyte count with latest hemoglobin 7.4, platelets 217,000. No current indications for bone marrow biopsies. Therapy evaluations completed due to patient's decreased functional ability/multimedical he was admitted for a comprehensive rehab program Patient transferred to CIR on 09/19/2020 .    Patient currently requires min with basic self-care skills secondary to muscle weakness, decreased cardiorespiratoy endurance, unbalanced muscle activation and decreased motor planning and decreased sitting balance, decreased standing balance, decreased postural control and decreased balance strategies.  Prior to hospitalization, patient could complete BADL and IADL with modified independent .  Patient will benefit from skilled intervention to decrease level of assist with basic self-care skills and increase independence with basic self-care skills prior to discharge home with care partner.  Anticipate patient will require intermittent supervision and follow up home health.  OT - End of Session Activity Tolerance: Tolerates 30+ min activity with multiple rests Endurance Deficit: Yes OT Assessment OT Patient demonstrates impairments in the following area(s): Balance;Edema;Endurance;Motor;Pain;Perception;Safety;Sensory OT Basic ADL's Functional Problem(s): Grooming;Bathing;Dressing;Toileting OT Transfers Functional Problem(s): Toilet;Tub/Shower OT Additional Impairment(s): None OT Plan OT Intensity: Minimum of 1-2 x/day, 45 to 90 minutes OT Frequency: 5 out of 7 days OT Duration/Estimated Length of Stay: 10-14 OT Treatment/Interventions: Balance/vestibular training;Discharge planning;Pain management;Self Care/advanced ADL retraining;Therapeutic Activities;UE/LE Coordination activities;Visual/perceptual remediation/compensation;Therapeutic Exercise;Skin care/wound managment;Patient/family education;Functional mobility  training;Disease mangement/prevention;Community reintegration;DME/adaptive equipment instruction;Neuromuscular re-education;Psychosocial support;Splinting/orthotics;UE/LE Strength taining/ROM;Wheelchair propulsion/positioning OT Self Feeding Anticipated Outcome(s): no goal OT Basic Self-Care Anticipated Outcome(s): Supervision OT Toileting Anticipated Outcome(s): Mod I OT Bathroom Transfers Anticipated Outcome(s): Supervision OT Recommendation Patient destination: Home Follow Up Recommendations: Home health OT Equipment Recommended: 3 in 1 bedside comode;Tub/shower bench;To be determined Equipment Details: will need TTB - unclear yet where pt wants to purchase   OT Evaluation Precautions/Restrictions  Precautions Precautions: Fall Precaution Comments: L AKA Restrictions Weight Bearing Restrictions: Yes LLE Weight Bearing: Non weight bearing Pain Pain Assessment Pain Scale: 0-10 Pain Score: 7  Pain Type: Phantom pain Pain Location: Leg Pain Orientation: Left Pain Descriptors / Indicators: Throbbing Pain Frequency: Intermittent Pain Onset: On-going Patients Stated Pain Goal: 4 Pain Intervention(s): Medication (See eMAR) (oxycodone) Home Living/Prior Functioning Home Living Family/patient expects to be discharged to:: Private residence Living Arrangements: Other relatives (Previously lived with cousin who cannot provide physcial assistance) Available Help at Discharge: Family,Available PRN/intermittently Type of Home: Mobile home Home Access: Stairs to enter Entrance Stairs-Number of Steps: 2 steps in back and 4 steps in front. pt denies ramp entry Entrance Stairs-Rails: Left (no handrail on back stairs, one "loose" one on the front stairs) Home Layout: One level Bathroom Shower/Tub: Chiropodist: Standard Bathroom Accessibility: Yes Additional Comments: pt states his cousin is disabled and could not provide any physical assist however reports brother is  coming to assist and is able to assist physically  Lives With: Other (Comment) (lives with autistic cousin but family is coming to stay with him to assist when DC home) IADL History Homemaking Responsibilities: Yes Meal Prep Responsibility: Primary Laundry Responsibility: Primary Shopping Responsibility: Primary Current License: Yes Leisure and Hobbies: states he grows and sells some vegetables Prior Function Level of Independence: Independent with basic ADLs,Independent with transfers,Independent with homemaking with ambulation,Independent with gait  Able to Take Stairs?: Yes Driving: Yes 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, drives, 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 Vision Baseline Vision/History: Wears glasses Wears Glasses: Reading only Patient Visual Report: No change from baseline Perception  Perception: Within Functional Limits Praxis Praxis: Intact  Cognition Overall Cognitive Status: Within Functional Limits for tasks assessed Arousal/Alertness: Awake/alert Orientation Level: Person;Place;Situation Person: Oriented Place: Oriented Situation: Oriented Year: 2022 Month: January Day of Week: Incorrect Memory: Appears intact Immediate Memory Recall: Sock;Blue;Bed Memory Recall Sock: Without Cue Memory Recall Blue: Without Cue Memory Recall Bed: Without Cue Attention: Focused Focused Attention: Appears intact Executive Function: Reasoning;Sequencing Reasoning: Appears intact Sequencing: Appears intact Safety/Judgment: Appears intact Sensation Sensation Light Touch: Impaired by gross assessment (phantom pain/itching in residual limb) Coordination Gross Motor Movements are Fluid and Coordinated: No Fine Motor Movements are Fluid and Coordinated: Yes Motor  Motor Motor: Within Functional Limits Motor - Skilled Clinical Observations: limited 2/2 pain and new amputation  Trunk/Postural Assessment  Cervical Assessment Cervical Assessment: Within Functional  Limits Thoracic Assessment Thoracic Assessment: Within Functional Limits Lumbar Assessment Lumbar Assessment: Exceptions to [facility] Postural Control Postural Control: Deficits on evaluation  Balance Balance Balance Assessed: Yes Static Sitting Balance Static Sitting - Balance Support: Feet supported Static Sitting - Level of Assistance: 5: Stand by assistance Dynamic Sitting Balance Dynamic Sitting - Balance Support: Feet supported Dynamic Sitting - Level of Assistance: 5: Stand by assistance Static Standing Balance Static Standing - Balance Support: Bilateral upper extremity supported Static Standing - Level of Assistance: 4: Min assist Dynamic Standing Balance Dynamic Standing - Balance Support: During functional activity;Left upper extremity supported Dynamic Standing - Level of Assistance: 4: Min assist Dynamic Standing - Balance Activities: Forward lean/weight shifting;Reaching for objects Extremity/Trunk Assessment RUE Assessment RUE Assessment: Within Functional Limits General Strength Comments: generalized weakness s/p surgery  present but functional during ADL LUE Assessment LUE Assessment: Within Functional Limits General Strength Comments: generalized weakness s/p surgery  present but functional during ADL  Care Tool Care Tool Self Care Eating    Indep    Oral Care    Oral Care Assist Level: Set up assist    Bathing   Body parts bathed by patient: Right arm;Left arm;Chest;Abdomen;Front perineal area;Right upper leg;Buttocks;Left upper leg;Right lower leg;Face Body parts bathed by helper: Right lower leg Body parts n/a: Left lower leg Assist Level: Minimal Assistance - Patient > 75%    Upper Body Dressing(including orthotics)   What is the patient wearing?: Pull over shirt   Assist Level: Supervision/Verbal cueing    Lower Body Dressing (excluding footwear)     Assist for lower body dressing:  (unable to observe at eval d/t time contraints)    Putting  on/Taking off footwear   What is the patient wearing?: Orthosis (shrinker) Assist for footwear: Total Assistance - Patient < 25%       Care Tool Toileting Toileting activity   Assist for toileting: Minimal Assistance - Patient > 75%     Care Tool Bed Mobility Roll left and right activity   Roll left and right assist level: Minimal Assistance - Patient > 75%    Sit to lying activity   Sit to lying assist level: Minimal Assistance - Patient > 75%    Lying to sitting edge of bed activity   Lying to sitting edge of bed assist level: Minimal Assistance - Patient > 75%     Care Tool Transfers Sit to stand transfer   Sit to stand assist level: Minimal Assistance - Patient > 75%    Chair/bed transfer   Chair/bed transfer assist level: Minimal Assistance - Patient > 75%     Toilet transfer   Assist Level: Minimal Assistance - Patient > 75%     Care Tool Cognition Expression of Ideas  and Wants Expression of Ideas and Wants: Without difficulty (complex and basic) - expresses complex messages without difficulty and with speech that is clear and easy to understand   Understanding Verbal and Non-Verbal Content Understanding Verbal and Non-Verbal Content: Understands (complex and basic) - clear comprehension without cues or repetitions   Memory/Recall Ability *first 3 days only Memory/Recall Ability *first 3 days only: Current season;Location of own room;Staff names and faces;That he or she is in a hospital/hospital unit    Refer to Care Plan for [place] 1 OT Short Term Goal 1 (Week 1): Pt will perform BSC/toilet transfers with CGA OT Short Term Goal 2 (Week 1): Pt will perform LB bathe/dress with CGA using AE PRN OT Short Term Goal 3 (Week 1): Pt will perform ADL/activity for 15+ minutes without rest break OT Short Term Goal 4 (Week 1): Pt will perform clothing management standing with CGA  Recommendations for other services: None    Skilled  Therapeutic Intervention ADL ADL Eating: Not assessed Grooming: Not assessed Upper Body Bathing: Supervision/safety Where Assessed-Upper Body Bathing: Edge of bed Lower Body Bathing: Minimal assistance Where Assessed-Lower Body Bathing: Edge of bed Upper Body Dressing: Supervision/safety Where Assessed-Upper Body Dressing: Edge of bed Lower Body Dressing: Not assessed (not assessed d/t time constraints) Toileting: Minimal assistance Where Assessed-Toileting: Bedside Commode Toilet Transfer: Minimal assistance Toilet Transfer Method: Stand pivot Toilet Transfer Equipment: Bedside commode Mobility  Bed Mobility Bed Mobility: Rolling Right;Rolling Left;Supine to Sit;Sitting - Scoot to [employer] of Bed;Sit to Supine;Scooting to [facility] Rolling Right: Minimal Assistance - Patient > 75% Rolling Left: Minimal Assistance - Patient > 75% Supine to Sit: Minimal Assistance - Patient > 75% Sitting - Scoot to [employer] of Bed: Contact Guard/Touching assist Sit to Supine: Minimal Assistance - Patient > 75% Scooting to HOB: Minimal Assistance - Patient > 75% Transfers Sit to Stand: Minimal Assistance - Patient > 75% Stand to Sit: Minimal Assistance - Patient > 75%   Intervention: Pt greeted at time of session supine in bed resting with HOB elevated, discussed diagnosis, surgery, phantom limb pain, and CIR structure for therapy services.   Initial part of session spent retrieving RW and items for ADL. Supine to sit EOB Min A with bed features and initially tried to wrap with ace wrap but d/t short residual limb unable. Also performed tapping on residual limb for NMR and decreased phantom limb pain. Donned shrinker with extended time total A, pt standing to fully pull up and wrap around waist. Sit to stands consistently Min A at RW with cues for hand placement, able to stand approx 3-4 minutes during ADL with support of RW. UB/LB bathing at EOB d/t time constraints with Min A overall to reach RLE past knee level,  bale to wash buttocks and periarea in standing. UB dress supervision before pt stating he needed to have BM, stand pivot bed > BSC with Min A toward L side and left on commode with call bell in reach, pt aware he is not to stand, perform hygiene, etc. Until nursing staff/assist are present. RN aware.    Discharge Criteria: Patient will be discharged from OT if patient refuses treatment 3 consecutive times without medical reason, if treatment goals not met, if there is a change in medical status, if patient makes no progress towards goals or if patient is discharged from hospital.  The above assessment, treatment plan, treatment alternatives and goals were discussed and mutually agreed upon: by patient  Viona Gilmore 09/20/2020, 12:51 PM

## 2020-09-20 NOTE — Progress Notes (Signed)
[place] PHYSICAL MEDICINE & REHABILITATION PROGRESS NOTE   Subjective/Complaints:  Pt reports L AKA dressing won't stay in place- has shrinker, but nursing hasn't put it on.   Residual limb pain 8/10- but phantom pain is WORSE- just took pain meds- mainly feels like itching cannot scratch.   LBM yesterday, but is hard- just got miralax.   Tired- slept off and on. Didn't sleep well  Will try mirror therapy for phantom pain.     ROS:  Pt denies SOB, abd pain, CP, N/V/C/D, and vision changes  Objective:   No results found. Recent Labs    09/18/20 1412 09/20/20 0546  WBC 8.0 7.2  HGB 7.4* 7.5*  HCT 24.2* 23.8*  PLT 217 239   Recent Labs    09/18/20 0837 09/20/20 0546  NA 141 139  K 4.3 4.5  CL 106 104  CO2 26 24  GLUCOSE 93 90  BUN 18 16  CREATININE 1.45* 1.41*  CALCIUM 8.0* 8.5*    Intake/Output Summary (Last 24 hours) at 09/20/2020 0852 Last data filed at 09/20/2020 0838 Gross per 24 hour  Intake 716 ml  Output 1375 ml  Net -659 ml        Physical Exam: Vital Signs Blood pressure (!) 145/91, pulse 75, temperature 98 F (36.7 C), resp. rate 16, height 5\' 11"  (1.803 m), weight 113.2 kg, SpO2 93 %.  Physical Exam  General: laying in bed supine, appropriate, quiet/tired, NAD Mood and affect are somewhat flat Heart: RRR- no JVD Lungs: CTA B/L- no W/R/R- good air movement Abdomen: Soft, NT, ND, (+)BS  Extremities: No clubbing, cyanosis, or edema L AKA Skin: No evidence of breakdown, no evidence of rash Neurologic: Ox3  Cranial nerves II through XII intact, motor strength is 5/5 in bilateral deltoid, bicep, tricep, grip, Right hip flexor, knee extensors, ankle dorsiflexor and plantar flexor, 3 - left hip flexor,  Sensory exam normal sensation to light touch  in bilateral upper and lower extremities Cerebellar exam normal finger to nose to finger as well as heel to shin in bilateral upper and lower extremities Musculoskeletal: L AKA- has VERTICAL  incision- mild sanguinous drainage from posterior aspect of incision- very dry/flaking a lot- sutures in place- significant swelling. Took picture, but didn't go into chart.     Assessment/Plan: 1. Functional deficits which require 3+ hours per day of interdisciplinary therapy in a comprehensive inpatient rehab setting.  Physiatrist is providing close team supervision and 24 hour management of active medical problems listed below.  Physiatrist and rehab team continue to assess barriers to discharge/monitor patient progress toward functional and medical goals  Care Tool:  Bathing              Bathing assist       Upper Body Dressing/Undressing Upper body dressing        Upper body assist      Lower Body Dressing/Undressing Lower body dressing            Lower body assist       Toileting Toileting    Toileting assist Assist for toileting: Independent     Transfers Chair/bed transfer  Transfers assist           Locomotion Ambulation   Ambulation assist              Walk 10 feet activity   Assist           Walk 50 feet activity   Assist  Walk 150 feet activity   Assist           Walk 10 feet on uneven surface  activity   Assist           Wheelchair     Assist               Wheelchair 50 feet with 2 turns activity    Assist            Wheelchair 150 feet activity     Assist          Blood pressure (!) 145/91, pulse 75, temperature 98 F (36.7 C), resp. rate 16, height 5\' 11"  (1.803 m), weight 113.2 kg, SpO2 93 %.  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  -patient may  shower -ELOS/Goals: 10-12d Sup/ModI goals 2. Antithrombotics: -DVT/anticoagulation:Chronic Eliquis has been resumed. -antiplatelet therapy:  3. Pain  Management:Voltaren gel 4 times daily, Neurontin 400 mg 3 times daily, OxyContin CR 20 mg every 12 hours, oxycodone as needed, Robaxin 1000 mg every 6 hours as needed  1/25- on gabapentin - for phantom pain- will add Cymbalta 30 mg QHS for 4 days,then 60 mg QHS. Con't Oxycontin and Oxycodone.  4. Mood:Provide emotional support -antipsychotic agents: N/A 5. Neuropsych: This patientiscapable of making decisions on hisown behalf. 6. Skin/Wound Care:Routine skin checks  1/24- eucerin BID added 7. Fluids/Electrolytes/Nutrition:Routine in and outs with follow-up chemistries 8. Severe normocytic anemia. Follow-up CBC. Follow-up hematology services  1/25- Hb 7.5- stable so far.  9. Atrial fibrillation. Cardizem 240 mg daily, continue Eliquis. 10. 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 Latest creatinine 1.45, total CK 202. Follow-up renal services 11. Hypertension. Aldactone 25 mg dailyNormodyne 400 mg twice daily. Monitor with increased mobility  1/24- BP 145/91- slightly elevated- but will monitor since on multiple meds.  12. Aortic dissection with repair. Follow-up outpatient 13. Obesity. Dietary follow-up     LOS: 1 days A FACE TO FACE EVALUATION WAS PERFORMED  Connor [name] 09/20/2020, 8:52 AM

## 2020-09-20 NOTE — Plan of Care (Signed)
  Problem: RH Balance Goal: LTG Patient will maintain dynamic standing with ADLs (OT) Description: LTG:  Patient will maintain dynamic standing balance with assist during activities of daily living (OT)  Flowsheets (Taken 09/20/2020 1650) LTG: Pt will maintain dynamic standing balance during ADLs with: Supervision/Verbal cueing   Problem: Sit to Stand Goal: LTG:  Patient will perform sit to stand in prep for activites of daily living with assistance level (OT) Description: LTG:  Patient will perform sit to stand in prep for activites of daily living with assistance level (OT) Flowsheets (Taken 09/20/2020 1650) LTG: PT will perform sit to stand in prep for activites of daily living with assistance level: Supervision/Verbal cueing   Problem: RH Grooming Goal: LTG Patient will perform grooming w/assist,cues/equip (OT) Description: LTG: Patient will perform grooming with assist, with/without cues using equipment (OT) Flowsheets (Taken 09/20/2020 1650) LTG: Pt will perform grooming with assistance level of: Set up assist    Problem: RH Bathing Goal: LTG Patient will bathe all body parts with assist levels (OT) Description: LTG: Patient will bathe all body parts with assist levels (OT) Flowsheets (Taken 09/20/2020 1650) LTG: Pt will perform bathing with assistance level/cueing: Supervision/Verbal cueing   Problem: RH Dressing Goal: LTG Patient will perform upper body dressing (OT) Description: LTG Patient will perform upper body dressing with assist, with/without cues (OT). Flowsheets (Taken 09/20/2020 1650) LTG: Pt will perform upper body dressing with assistance level of: Set up assist Goal: LTG Patient will perform lower body dressing w/assist (OT) Description: LTG: Patient will perform lower body dressing with assist, with/without cues in positioning using equipment (OT) Flowsheets (Taken 09/20/2020 1650) LTG: Pt will perform lower body dressing with assistance level of: Supervision/Verbal  cueing   Problem: RH Toileting Goal: LTG Patient will perform toileting task (3/3 steps) with assistance level (OT) Description: LTG: Patient will perform toileting task (3/3 steps) with assistance level (OT)  Flowsheets (Taken 09/20/2020 1650) LTG: Pt will perform toileting task (3/3 steps) with assistance level: Independent with assistive device   Problem: RH Toilet Transfers Goal: LTG Patient will perform toilet transfers w/assist (OT) Description: LTG: Patient will perform toilet transfers with assist, with/without cues using equipment (OT) Flowsheets (Taken 09/20/2020 1650) LTG: Pt will perform toilet transfers with assistance level of: Supervision/Verbal cueing   Problem: RH Tub/Shower Transfers Goal: LTG Patient will perform tub/shower transfers w/assist (OT) Description: LTG: Patient will perform tub/shower transfers with assist, with/without cues using equipment (OT) Flowsheets (Taken 09/20/2020 1650) LTG: Pt will perform tub/shower stall transfers with assistance level of: Supervision/Verbal cueing

## 2020-09-20 NOTE — Progress Notes (Signed)
Inpatient Rehabilitation Care Coordinator Assessment and Plan Patient Details  Name: Connor [name] MRN: [id number] Date of Birth: Jun 09, 1956  Today's Date: 09/20/2020  Hospital Problems: Principal Problem:   History of left above knee amputation [facility]) Active Problems:   Left above-knee amputee [facility])  Past Medical History:  Past Medical History:  Diagnosis Date  . AAA (abdominal aortic aneurysm) ([place])   . History of open heart surgery   . Hypertension   . Seroma due to trauma [facility] [place])    Past Surgical History:  Past Surgical History:  Procedure Laterality Date  . ABDOMINAL AORTIC ANEURYSM REPAIR    . AMPUTATION Left 09/07/2020   Procedure: ATTEMPTED LEFT LEG DEBRIDEMENT FASCIOTOMIES, APPLY INSTILLATION WOUND VAC, ABOVE KNEE AMPUTATION;  Surgeon: Newt Minion, MD;  Location: [place];  Service: Orthopedics;  Laterality: Left;  . BUBBLE STUDY  09/05/2020   Procedure: BUBBLE STUDY;  Surgeon: Freada Bergeron, MD;  Location: [place];  Service: Cardiovascular;;  . CARDIOVERSION N/A 02/11/2020   Procedure: CARDIOVERSION;  Surgeon: Donato Heinz, MD;  Location: [facility] ENDOSCOPY;  Service: Cardiovascular;  Laterality: N/A;  . CORONARY ARTERY BYPASS GRAFT    . STUMP REVISION Left 09/09/2020   Procedure: REVISION LEFT ABOVE KNEE AMPUTATION;  Surgeon: Newt Minion, MD;  Location: [place];  Service: Orthopedics;  Laterality: Left;  . TEE WITHOUT CARDIOVERSION N/A 09/05/2020   Procedure: TRANSESOPHAGEAL ECHOCARDIOGRAM (TEE);  Surgeon: Freada Bergeron, MD;  Location: [facility] ENDOSCOPY;  Service: Cardiovascular;  Laterality: N/A;   Social History:  reports that he has been smoking. He has been smoking about 0.50 packs per day. He has never used smokeless tobacco. He reports that he does not drink alcohol and does not use drugs.  Family / Support Systems Marital Status: Single Other Supports: Rodolphe Edmonston (Niece) Anticipated Caregiver: niece Ability/Limitations of Caregiver: can only  procide supervision Caregiver Availability: Intermittent  Social History Preferred language: English Religion:  Read: Yes Write: Yes Guardian/Conservator: Aleczander Fandino (brother), Dyami Umbach (Niece)   Abuse/Neglect Abuse/Neglect Assessment Can Be Completed: Yes Physical Abuse: Denies Verbal Abuse: Denies Sexual Abuse: Denies Exploitation of patient/patient's resources: Denies Self-Neglect: Denies  Emotional Status Pt's affect, behavior and adjustment status: patient flat Recent Psychosocial Issues: no Psychiatric History: no Substance Abuse History: no  Patient / Family Perceptions, Expectations & Goals Pt/Family understanding of illness & functional limitations: yes Premorbid pt/family roles/activities: Active in community PTA, Independent overall previously Anticipated changes in roles/activities/participation: will need some assistance Pt/family expectations/goals: MOD I/ Intermittant Supervision  [place] Agencies: None Premorbid Home Care/DME Agencies: Other (Comment) Radio broadcast assistant) Transportation available at discharge: family able to transport Resource referrals recommended: Neuropsychology (coping)  Discharge Planning Living Arrangements: Other relatives (Previously lived with cousin who cannot provide physcial assistance) Support Systems: Other relatives (Cousin and Niece) Type of Residence: Private residence (mobile home, 2 steps to enter (able to reach both rails)) Insurance Resources: Multimedia programmer (specify) (Medicaid of [place]) Living Expenses: Lives with family (Lives with cousin) Money Management: Patient Does the patient have any problems obtaining your medications?: No Home Management: Independent Care Coordinator Barriers to Discharge: Insurance for SNF coverage,Wound Care,Home environment access/layout Care Coordinator Barriers to Discharge Comments: Patient Home not [place], cousin unable to provide physcial  assistance Care Coordinator Anticipated Follow Up Needs: HH/OP Expected length of stay: 10-12 Days  Clinical Impression SW met with patient introduced self, addressed questions and concerns. Patient requesting to get back in bed, RN informed. Patient very flat will add to neuro  list, appears to be in pain. Patient will discharge back home with his cousin (unable to provide physical assistance). Mobile home, 2 steps to enter. Niece will be primary caregiver, can only provide supervision level assist intermittently.   Dyanne Iha 09/20/2020, 12:59 PM

## 2020-09-20 NOTE — Patient Care Conference (Signed)
Inpatient RehabilitationTeam Conference and Plan of Care Update Date: 09/20/2020   Time: 11:42 AM    Patient Name: Connor [name]      Medical Record Number: [id number]  Date of Birth: Mar 26, 1956 Sex: Male         Room/Bed: 4W24C/4W24C-01 Payor Info: Payor: MEDICAID [place] / Plan: MEDICAID [place] ACCESS / Product Type: *No Product type* /    Admit Date/Time:  09/19/2020  2:34 PM  Primary Diagnosis:  History of left above knee amputation [facility])  Hospital Problems: Principal Problem:   History of left above knee amputation [facility]) Active Problems:   Left above-knee amputee [facility])    Expected Discharge Date: Expected Discharge Date: 09/30/20  Team Members Present: Physician leading conference: Dr. Genice Rouge Care Coodinator Present: Lavera Guise, BSW;Churchill Grimsley Marlyne Beards, RN, BSN, CRRN Nurse Present: Otilio Carpen, RN PT Present: Otelia Sergeant, PT OT Present: Earleen Newport, OT PPS Coordinator present : Edson Snowball, Park Breed, SLP     Current Status/Progress Goal Weekly Team Focus  Bowel/Bladder   Continent of bowel and bladder: LBM: 09/19/20  Remain continent of bowel and bladder with minimal assistance.  Assess bowel and bladder needs q shift and prn   Swallow/Nutrition/ Hydration             ADL's   Min overall, Min stand pivot > BSC, Min/Mod LB dress, Min LB bathe UB ADLs supervision/set up, phantom pain present, assist to don shrinker  Supervision  ADL retraining, ADL transfers, LB ADLs, sit to stands, NMR/mirror therapy, UB strength, general conditioning   Mobility   bed mobility min A-CGA, STS and SPT min A (to Rt side), min A 15' gait with RW and min A WC for 100'  supervision transfers and gait 20-30' with RW, WC 150' mod I  strength, transfers, short distance gait RW, activity tolerance and standing balance   Communication             Safety/Cognition/ Behavioral Observations            Pain   Patient rates pain 7/10: Scheduled pain medications administered   Patient rates pain < or equal to 3/10  Assess pain q shift and prn   Skin   Left BKA, no skin breakdown  Prevent skin breakdown, and to remain free from infection  Assess skin q shift and prn     Discharge Planning:   DC home with cousin who can't help. Niece to provide intermittent supervision.  Team Discussion: Complains of itching to phantom limb. MD would like therapy to set up mirror therapy. Has a vertical incision. Scheduled pain medications given. Nursing asking for order of Tylenol. MD checking PRN medications. Continent of B/B. Plan is to discharge to cousin's house but cousin can't help only provide supervision. Niece to provide intermittent supervision. Patient on target to meet rehab goals: Seems to get out of breath doing simple things. Min assist to do things, and has a flat affect. Has supervision goals. Inconsistent with report of ramp or stairs at home. Says there are 2 steps to enter.  *See Care Plan and progress notes for long and short-term goals.   Revisions to Treatment Plan:  MD added Cymbalta DR 30 mg 09/20/20 - 09/24/20, Cymbalta 60 mg 09/24/20 - 09/28/20, Eucerin cream for itching around incision, not on incision, and AKA shrinker. Teaching Needs: Family education, medication management, wound/skin care, balance training, gait training, transfer training, endurance training, fatigue management, stair training.  Current Barriers to Discharge: Inaccessible home environment, Decreased caregiver  support, Home enviroment access/layout, Wound care, Lack of/limited family support, Weight, Weight bearing restrictions, Medication compliance and Behavior  Possible Resolutions to Barriers: Continue current medications, weight bearing precautions, provide emotional support to patient and family.     Medical Summary Current Status: L AKA- vertical incision - pain 8/10- phantom pain is worse than stump pain; shrinker for L AKA; small area of weeping; LBM this AM; continent B/B   Barriers to Discharge: Behavior;Decreased family/caregiver support;Home enviroment access/layout;Medical stability;Weight;Weight bearing restrictions;Wound care;Other (comments)  Barriers to Discharge Comments: to go home with cousin- he cannot help- cousin also can give supervision; B/L rails to get in- has ramp? other family to help 24/7 x 2 weeks- DOE easily. Possible Resolutions to [employer]: out of breath easily/DOE(+)- min-mod A ADLs; goals supervision; min A PT- walked 10-15 ft- d/c Friday 2/4   Continued Need for Acute Rehabilitation Level of Care: The patient requires daily medical management by a physician with specialized training in physical medicine and rehabilitation for the following reasons: Direction of a multidisciplinary physical rehabilitation program to maximize functional independence : Yes Medical management of patient stability for increased activity during participation in an intensive rehabilitation regime.: Yes Analysis of laboratory values and/or radiology reports with any subsequent need for medication adjustment and/or medical intervention. : Yes   I attest that I was present, lead the team conference, and concur with the assessment and plan of the team.   Tennis Must 09/20/2020, 6:09 PM

## 2020-09-20 NOTE — Progress Notes (Signed)
Inpatient Rehabilitation Center Individual Statement of Services  Patient Name:  Connor [name]  Date:  09/20/2020  Welcome to the Inpatient Rehabilitation Center.  Our goal is to provide you with an individualized program based on your diagnosis and situation, designed to meet your specific needs.  With this comprehensive rehabilitation program, you will be expected to participate in at least 3 hours of rehabilitation therapies Monday-Friday, with modified therapy programming on the weekends.  Your rehabilitation program will include the following services:  Physical Therapy (PT), Occupational Therapy (OT), Speech Therapy (ST), 24 hour per day rehabilitation nursing, Psychology, Neuropsychology, Care Coordinator, Rehabilitation Medicine, Nutrition Services, Pharmacy Services and Other  Weekly team conferences will be held on Tuesday to discuss your progress.  Your Inpatient Rehabilitation Care Coordinator will talk with you frequently to get your input and to update you on team discussions.  Team conferences with you and your family in attendance may also be held.  Expected length of stay: 10-12 Days  Overall anticipated outcome: MOD I/Intermittant Supervision   Depending on your progress and recovery, your program may change. Your Inpatient Rehabilitation Care Coordinator will coordinate services and will keep you informed of any changes. Your Inpatient Rehabilitation Care Coordinator's name and contact numbers are listed  below.  The following services may also be recommended but are not provided by the Inpatient Rehabilitation Center:    Home Health Rehabiltiation Services  Outpatient Rehabilitation Services    Arrangements will be made to provide these services after discharge if needed.  Arrangements include referral to agencies that provide these services.  Your insurance has been verified to be:  Medicaid of [place] Your primary doctor is:  NO PCP  Pertinent information will be shared  with your doctor and your insurance company.  Inpatient Rehabilitation Care Coordinator:  Lavera Guise, [place] [phone] or [phone]  Information discussed with and copy given to patient by: Andria Rhein, 09/20/2020, 10:58 AM

## 2020-09-20 NOTE — Evaluation (Signed)
Physical Therapy Assessment and Plan  Patient Details  Name: Connor [name] MRN: [id number] Date of Birth: [birth year]  PT Diagnosis: Abnormal posture, Abnormality of gait, Difficulty walking, Impaired sensation, Muscle weakness and Pain in LLE at surgical site Rehab Potential: Good ELOS: 10-14 days   Today's Date: 09/20/2020 PT Individual Time: 1015-1130 and 1415- 1525 PT Individual Time Calculation (min): 75 min  And 70 mins  Hospital Problem: Principal Problem:   History of left above knee amputation (Connor [name]) Active Problems:   Left above-knee amputee [facility])   Past Medical History:  Past Medical History:  Diagnosis Date  . AAA (abdominal aortic aneurysm) ([place])   . History of open heart surgery   . Hypertension   . Seroma due to trauma [facility])    Past Surgical History:  Past Surgical History:  Procedure Laterality Date  . ABDOMINAL AORTIC ANEURYSM REPAIR    . AMPUTATION Left 09/07/2020   Procedure: ATTEMPTED LEFT LEG DEBRIDEMENT FASCIOTOMIES, APPLY INSTILLATION WOUND VAC, ABOVE KNEE AMPUTATION;  Surgeon: Connor Minion, MD;  Location: [place];  Service: Orthopedics;  Laterality: Left;  . BUBBLE STUDY  09/05/2020   Procedure: BUBBLE STUDY;  Surgeon: Connor Bergeron, MD;  Location: [place];  Service: Cardiovascular;;  . CARDIOVERSION N/A 02/11/2020   Procedure: CARDIOVERSION;  Surgeon: Connor Heinz, MD;  Location: [facility] ENDOSCOPY;  Service: Cardiovascular;  Laterality: N/A;  . CORONARY ARTERY BYPASS GRAFT    . STUMP REVISION Left 09/09/2020   Procedure: REVISION LEFT ABOVE KNEE AMPUTATION;  Surgeon: Connor Minion, MD;  Location: [place];  Service: Orthopedics;  Laterality: Left;  . TEE WITHOUT CARDIOVERSION N/A 09/05/2020   Procedure: TRANSESOPHAGEAL ECHOCARDIOGRAM (TEE);  Surgeon: Connor Bergeron, MD;  Location: [facility] ENDOSCOPY;  Service: Cardiovascular;  Laterality: N/A;    Assessment & Plan Clinical Impression: Patient is a 65 y.o. year old male with history of  aortic dissection with several complicated surgery including redo thoracic aortic repair as well as sternal infection, atrial fibrillation on Eliquis, CAD with CABG, CKD stage II-3, hypertension, obesity. Per chart review patient lives with his autistic cousinin a mobile home ramped entrance. Independent with assistive device. Community ambulator with straight point cane and driving. . Presented 08/30/2020 with persistent nausea vomiting diarrhea x5 days. In the ED noted fever 102.7 he was tachycardic. Chemistry showed a potassium 2.8 glucose 162 BUN 55 creatinine 4.21 from baseline of 1.38, WBC 15,600, hemoglobin 10.5, platelets 136,000, lactic acid 2.1 troponin 425-482, blood cultures GPC x2 Streptococcus urine cultureE Faecalis, CK 28,083.Marland Kitchen Chest x-ray showed no acute intrathoracic process. Patient noted edema and swelling of the left lower extremity. CT of left lower extremity showed no fracture or malalignment. No significant hip or knee effusion however there was moderate subcutaneous edema within the left lower extremity extending from the hip to the imaged proximal lower leg possibly representing cellulitis or nonspecific lower extremity edema. No gas containing fluid collections. Multiple enlarged left inguinal lymph nodes the largest measuring up to 3 cm. Nephrology consulted for AKI on CKD with rhabdomyolysis and felt AKI likely secondary to acute tubular injury decreased renal perfusion related to hypotension. Placed on gentle IV fluids and renal functions much improved with latest creatinine 1.45 and latest CK 202.Marland Kitchen Patient had been maintained on broad-spectrum antibiotics for suspected sepsis/cellulitis. Orthopedic service follow-up Dr. Sharol [name] for necrotizing fasciitis left leg wound was not felt to be salvageable undergoing left AKA 09/07/2020 with revision 09/09/2020 and placement of wound VAC. Hospital course patient required multiple transfusions for anemia.  His chronic  anticoagulation was resumed. Hematology service Dr. Lindi [name] consulted in regards to anemia felt to be multifactorial and work-up with iron studies and reticulocyte count with latest hemoglobin 7.4, platelets 217,000. No current indications for bone marrow biopsies. Therapy evaluations completed due to patient's decreased functional ability/multimedical he was admitted for a comprehensive rehab program.  Patient transferred to CIR on 09/19/2020 .   Patient currently requires min with mobility secondary to muscle weakness, decreased cardiorespiratoy endurance and decreased sitting balance, decreased standing balance, decreased postural control and decreased balance strategies.  Prior to hospitalization, patient was modified independent  with mobility and lived with Other (Comment) (lives with autistic cousin but family is coming to stay with him to assist when DC home) in a Mobile home home.  Home access is 2 steps in back and 4 steps in front. pt denies ramp entryStairs to enter.  Patient will benefit from skilled PT intervention to maximize safe functional mobility, minimize fall risk and decrease caregiver burden for planned discharge home with 24 hour assist.  Anticipate patient will benefit from follow up [facility] at discharge.  PT - End of Session Activity Tolerance: Tolerates 30+ min activity without fatigue Endurance Deficit: Yes PT Assessment Rehab Potential (ACUTE/IP ONLY): Good PT Barriers to Discharge: [place] home environment;Decreased caregiver support;Home environment access/layout;Wound Care;Weight bearing restrictions;Nutrition means;Weight;Lack of/limited family support;Medication compliance PT Plan PT Intensity: Minimum of 1-2 x/day ,45 to 90 minutes PT Frequency: 5 out of 7 days PT Duration Estimated Length of Stay: 10-14 days PT Treatment/Interventions: Ambulation/gait training;Cognitive remediation/compensation;Discharge planning;DME/adaptive equipment instruction;Functional  mobility training;Pain management;Psychosocial support;Splinting/orthotics;Therapeutic Activities;UE/LE Strength taining/ROM;Visual/perceptual remediation/compensation;Wheelchair propulsion/positioning;UE/LE Coordination activities;Therapeutic Exercise;Stair training;Patient/family education;Skin care/wound management;Neuromuscular re-education;Disease management/prevention;Community reintegration;Balance/vestibular training PT Transfers Anticipated Outcome(s): supervision PT Locomotion Anticipated Outcome(s): supervision PT Recommendation Follow Up Recommendations: Home health PT Patient destination: Home Equipment Recommended: To be determined   PT Evaluation Precautions/Restrictions Precautions Precautions: Fall Precaution Comments: L AKA Restrictions Weight Bearing Restrictions: Yes LLE Weight Bearing: Non weight bearing General   Vital Signs Pain Pain Assessment Pain Scale: 0-10 Pain Score: 7  Pain Type: Phantom pain Pain Location: Leg Pain Orientation: Left Pain Descriptors / Indicators: Throbbing Pain Frequency: Intermittent Pain Onset: On-going Patients Stated Pain Goal: 4 Pain Intervention(s): Medication (See eMAR) (oxycodone) Home Living/Prior Functioning Home Living Type of Home: Mobile home Home Access: Stairs to enter Entrance Stairs-Number of Steps: 2 steps in back and 4 steps in front. pt denies ramp entry Home Layout: One level Bathroom Shower/Tub: Government social research officer Accessibility: Yes Additional Comments: pt states his cousin is disabled and could not provide any physical assist however reports brother is coming to assist and is able to assist physically  Lives With: Other (Comment) (lives with autistic cousin but family is coming to stay with him to assist when DC home) Prior Function Level of Independence: Independent with basic ADLs;Independent with transfers;Independent with homemaking with ambulation;Independent with  gait  Able to Take Stairs?: Yes Driving: Yes 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, drives, 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 Vision/Perception  Perception Perception: Within Functional Limits Praxis Praxis: Intact  Cognition Overall Cognitive Status: Within Functional Limits for tasks assessed Arousal/Alertness: Awake/alert Focused Attention: Appears intact Memory: Appears intact Immediate Memory Recall: Sock;Blue;Bed Memory Recall Sock: Without Cue Memory Recall Blue: Without Cue Memory Recall Bed: Without Cue Executive Function: Reasoning;Sequencing Reasoning: Appears intact Sequencing: Appears intact Safety/Judgment: Appears intact Sensation Sensation Light Touch: Impaired by gross assessment (phantom pain/itching in residual limb) Coordination Gross Motor Movements are Fluid  and Coordinated: No Fine Motor Movements are Fluid and Coordinated: Yes Motor  Motor Motor: Within Functional Limits Motor - Skilled Clinical Observations: limited 2/2 pain and new amputation   Trunk/Postural Assessment  Cervical Assessment Cervical Assessment: Within Functional Limits Thoracic Assessment Thoracic Assessment: Within Functional Limits Lumbar Assessment Lumbar Assessment: Exceptions to [facility] Postural Control Postural Control: Deficits on evaluation  Balance Balance Balance Assessed: Yes Static Sitting Balance Static Sitting - Balance Support: Feet supported Static Sitting - Level of Assistance: 5: Stand by assistance Dynamic Sitting Balance Dynamic Sitting - Balance Support: Feet supported Dynamic Sitting - Level of Assistance: 5: Stand by assistance Static Standing Balance Static Standing - Balance Support: Bilateral upper extremity supported Static Standing - Level of Assistance: 4: Min assist Dynamic Standing Balance Dynamic Standing - Balance Support: During functional activity;Left upper extremity supported Dynamic Standing - Level of Assistance: 4: Min  assist Dynamic Standing - Balance Activities: Forward lean/weight shifting;Reaching for objects Extremity Assessment  RUE Assessment RUE Assessment: Within Functional Limits General Strength Comments: generalized weakness s/p surgery  present but functional during ADL LUE Assessment LUE Assessment: Within Functional Limits General Strength Comments: generalized weakness s/p surgery  present but functional during ADL      Care Tool Care Tool Bed Mobility Roll left and right activity   Roll left and right assist level: Minimal Assistance - Patient > 75%    Sit to lying activity   Sit to lying assist level: Minimal Assistance - Patient > 75%    Lying to sitting edge of bed activity   Lying to sitting edge of bed assist level: Minimal Assistance - Patient > 75%     Care Tool Transfers Sit to stand transfer   Sit to stand assist level: Minimal Assistance - Patient > 75%    Chair/bed transfer   Chair/bed transfer assist level: Minimal Assistance - Patient > 75%     Toilet transfer   Assist Level: Minimal Assistance - Patient > 75%    Car transfer   Car transfer assist level: Minimal Assistance - Patient > 75%      Care Tool Locomotion Ambulation   Assist level: Minimal Assistance - Patient > 75% Assistive device: Walker-rolling Max distance: 15  Walk 10 feet activity   Assist level: Minimal Assistance - Patient > 75% Assistive device: Walker-rolling   Walk 50 feet with 2 turns activity Walk 50 feet with 2 turns activity did not occur: Safety/medical concerns      Walk 150 feet activity Walk 150 feet activity did not occur: Safety/medical concerns      Walk 10 feet on uneven surfaces activity Walk 10 feet on uneven surfaces activity did not occur: Safety/medical concerns      Stairs Stair activity did not occur: Safety/medical concerns        Walk up/down 1 step activity Walk up/down 1 step or curb (drop down) activity did not occur: Safety/medical concerns      Walk up/down 4 steps activity did not occuR: Safety/medical concerns  Walk up/down 4 steps activity      Walk up/down 12 steps activity Walk up/down 12 steps activity did not occur: Safety/medical concerns      Pick up small objects from floor Pick up small object from the floor (from standing position) activity did not occur: Safety/medical concerns      Wheelchair Will patient use wheelchair at discharge?: Yes Type of Wheelchair: Manual   Wheelchair assist level: Moderate Assistance - Patient 50 - 74% Max wheelchair  distance: 150'  Wheel 50 feet with 2 turns activity   Assist Level: Minimal Assistance - Patient > 75%  Wheel 150 feet activity   Assist Level: Moderate Assistance - Patient 50 - 74%    Refer to Care Plan for Long Term Goals  SHORT TERM GOAL WEEK 1    Recommendations for other services: None   Skilled Therapeutic Intervention   Evaluation completed (see details above and below) with education on PT POC and goals and individual treatment initiated with focus on  Bed mobility, transfer training, gait training, safety awareness, call light use, standing and sitting balance, WC mobility. pt received in bed and agreeable to therapy. Pt reported 7/10 pain, nursing present for wound dressing and pt reported increased 8/10 pain with this. PT brought WC to pt's room of manual WC 22x18 with L amputee pad. Pt directed in donning pants in supine, max A to complete, transferred to sitting EOB with bedrail use min A. Pt directed in Sit to stand with Rolling walker min A for pulling pants over hips. Pt then directed in Stand pivot transfer to WC min A with extra time and noted short shuffling of RLE to pivot to WC, not clearing floor to hop but grossly min A. Pt directed in [facility] mobility for total of 150' with pt able to complete initial 100' min A and required mod A for final 50' 2/2 fatigue. Pt directed in 15' gait training min A with Rolling walker with short hopping technique noted  and pt limited 2/2 fatigue and required sitting rest break with noted shortness of breath but relieved with short rest break. Pt returned to room left in [facility], alarm belt set, All needs in reach and in good condition. Call light in hand.    Second session: pt received in bed and agreeable to therapy but reported 8/10 pain and requested to ask nursing for pain medication, however pt not due to medication at this time and nursing present for medication pt was due for. Pt then directed in supine>sit min A, sat EOB SBA, directed in Stand pivot transfer to WC to Rt with Rolling walker min A for stability. Pt directed in WC mobility 130' min A with decreased cadence and VC for UE propulsion technique for effectivness.  Pt then taken rest of distance to gym total A for time and energy and directed in seated BLE Strengthening exercises 3# on RLE no added resistance on LLE for 2x10 leg lifts on LLE only and hip abduction and adduction, marching, LAQ on RLE with visual demo to complete. Pt directed in x5 Sit to stand to Rolling walker for improved glute strength and overall level of A for transfers. Pt taken to ortho gym total A for time and energy and setup for car transfer min A to complete with VC for technique and rest break once inside car. Pt taken back to room total A for time and energy. Pt requested to remain in [place] to finish food. Pt left in WC, alarm set, All needs in reach and in good condition. Call light in hand.    Mobility Bed Mobility Bed Mobility: Rolling Right;Rolling Left;Supine to Sit;Sitting - Scoot to [employer] of Bed;Sit to Supine;Scooting to [facility] Rolling Right: Minimal Assistance - Patient > 75% Rolling Left: Minimal Assistance - Patient > 75% Supine to Sit: Minimal Assistance - Patient > 75% Sitting - Scoot to Edge of Bed: Contact Guard/Touching assist Sit to Supine: Minimal Assistance - Patient > 75% Scooting to HOB:  Minimal Assistance - Patient > 75% Transfers Transfers: Sit to Stand;Stand to  Sit;Stand Pivot Transfers Sit to Stand: Minimal Assistance - Patient > 75% Stand to Sit: Minimal Assistance - Patient > 75% Stand Pivot Transfers: Minimal Assistance - Patient > 75% Stand Pivot Transfer Details: Verbal cues for gait pattern;Verbal cues for technique;Verbal cues for precautions/safety Transfer (Assistive device): Rolling walker Locomotion  Gait Ambulation: Yes Gait Assistance: Minimal Assistance - Patient > 75% Gait Distance (Feet): 15 Feet Assistive device: Rolling walker Gait Assistance Details: Verbal cues for gait pattern;Verbal cues for technique;Verbal cues for precautions/safety;Verbal cues for safe use of DME/AE;Verbal cues for sequencing Gait Gait: Yes Gait Pattern: Impaired Gait Pattern:  (hopping pattern 2/2 L AKA, short hop lengths) Gait velocity: decreased Stairs / Additional Locomotion Stairs: No Wheelchair Mobility Wheelchair Mobility: Yes Wheelchair Assistance: Minimal assistance - Patient >75% Wheelchair Propulsion: Both upper extremities Wheelchair Parts Management: Supervision/cueing;Needs assistance Distance: 150   Discharge Criteria: Patient will be discharged from PT if patient refuses treatment 3 consecutive times without medical reason, if treatment goals not met, if there is a change in medical status, if patient makes no progress towards goals or if patient is discharged from hospital.  The above assessment, treatment plan, treatment alternatives and goals were discussed and mutually agreed upon: by patient  Junie Panning 09/20/2020, 3:34 PM

## 2020-09-20 NOTE — Progress Notes (Signed)
Inpatient Rehabilitation  Patient information reviewed and entered into eRehab system by Insiya Oshea M. Kaleah Hagemeister, M.A., CCC/SLP, PPS Coordinator.  Information including medical coding, functional ability and quality indicators will be reviewed and updated through discharge.    

## 2020-09-21 ENCOUNTER — Inpatient Hospital Stay (HOSPITAL_COMMUNITY): Payer: Medicaid Other | Admitting: Physical Therapy

## 2020-09-21 ENCOUNTER — Inpatient Hospital Stay (HOSPITAL_COMMUNITY): Payer: Medicaid Other

## 2020-09-21 ENCOUNTER — Inpatient Hospital Stay (HOSPITAL_COMMUNITY): Payer: Medicaid Other | Admitting: Occupational Therapy

## 2020-09-21 MED ORDER — ALBUTEROL SULFATE (2.5 MG/3ML) 0.083% IN NEBU: 2.5000 mg | INHALATION_SOLUTION | RESPIRATORY_TRACT | Status: DC | PRN

## 2020-09-21 MED ORDER — TRAMADOL HCL 50 MG PO TABS: 50.0000 mg | ORAL_TABLET | Freq: Four times a day (QID) | ORAL | Status: DC | PRN

## 2020-09-21 MED ORDER — HYDRALAZINE HCL 25 MG PO TABS: 25.0000 mg | ORAL_TABLET | Freq: Four times a day (QID) | ORAL | Status: DC | PRN

## 2020-09-21 MED ORDER — OXYCODONE HCL 5 MG PO TABS
10.0000 mg | ORAL_TABLET | ORAL | Status: DC | PRN
  Administered 2020-09-21 – 2020-09-30 (×13): 10 mg via ORAL
  Filled 2020-09-21 (×12): qty 2

## 2020-09-21 NOTE — Progress Notes (Signed)
Occupational Therapy Session Note  Patient Details  Name: Connor [name] MRN: [id number] Date of Birth: 16-Sep-1955  Today's Date: 09/21/2020 OT Individual Time: 1400-1457 OT Individual Time Calculation (min): 57 min    Short Term Goals: Week 1:  OT Short Term Goal 1 (Week 1): Pt will perform BSC/toilet transfers with CGA OT Short Term Goal 2 (Week 1): Pt will perform LB bathe/dress with CGA using AE PRN OT Short Term Goal 3 (Week 1): Pt will perform ADL/activity for 15+ minutes without rest break OT Short Term Goal 4 (Week 1): Pt will perform clothing management standing with CGA  Skilled Therapeutic Interventions/Progress Updates:    Pt greeted at time of session sitting up in wheelchair just finished eating lunch, very fatigued but agreeable to OT session. Reports some phantom pain today but none at the moment. Removed shrinker at beginning of session and performed densensitizing tapping/rubbing. Wanting to wash up and change, self propel to sink and doffed clothing CGA. UB bathe/dress with Supervision and LB bathe with Min A to thoroughly clean R foot but able to roughly reach, LB dressing w/ pants only with CGA. Discussed DC planning as well as DC date with plan for brother to come from [place] to help. Stand pivot w/c > bed Min/CGA with RW. Also applied shrinker today initially in sitting and eventually standing to fully pull up, pt did have some pain with this no # given. Shrinker in place at end of session for max benefit. In bed supine alarm on call bell in reach.    Therapy Documentation Precautions:  Precautions Precautions: Fall Precaution Comments: L AKA Restrictions Weight Bearing Restrictions: Yes LLE Weight Bearing: Non weight bearing     Therapy/Group: Individual Therapy  Erasmo Score 09/21/2020, 7:27 AM

## 2020-09-21 NOTE — Progress Notes (Signed)
Physical Therapy Session Note  Patient Details  Name: Connor [name] MRN: [id number] Date of Birth: July 27, 1956  Today's Date: 09/21/2020 PT Individual Time: 1700-1749 PT Individual Time Calculation (min): 30 min   Short Term Goals: Week 1:  PT Short Term Goal 1 (Week 1): Patient will complete all bed mobility with no more CGA PT Short Term Goal 2 (Week 1): Patient will complete bed<> wc transfer with no more than CGA consistently and LRAD PT Short Term Goal 3 (Week 1): Patient will tolerate standing >51mins with LRAD and no more than CGA PT Short Term Goal 4 (Week 1): Patient will be able to teachback s/s of poor wound healing/infection in residual limb  Skilled Therapeutic Interventions/Progress Updates: Pt presented in w/c agreeable to therapy. Pt expressed he had fallen asleep in chair and had not received pain meds nor lunch. Pain currently 10/10, PTA notified nurse (Dauda) and pt received pain meds. Nsg stating that they will contact food services for lunch. Pt transported to ortho gym and participated in UBE L4 hills program 4 min forward 4 min backwards for BUE strengthening and general conditioning. Pt with brief rest between bouts. Pt transported back to room and agreeable to remain in w/c to eat lunch. At that time pt's lunch arrived. Pt left in w/c at end of session with belt alarm on, call bell within reach and lunch set up for pt.      Therapy Documentation Precautions:  Precautions Precautions: Fall Precaution Comments: L AKA Restrictions Weight Bearing Restrictions: Yes LLE Weight Bearing: Non weight bearing General:   Vital Signs: Therapy Vitals Temp: 97.8 F (36.6 C) Pulse Rate: (!) 57 Resp: 17 BP: (!) 160/80 Patient Position (if appropriate): Sitting Oxygen Therapy SpO2: 95 % O2 Device: Room Air Pain: Pain Assessment Pain Scale: 0-10 Pain Score: 10-Worst pain ever Pain Type: Acute pain Pain Location: Leg Pain Orientation: Left Pain Descriptors /  Indicators: Aching Pain Frequency: Constant Pain Onset: On-going Patients Stated Pain Goal: 2 Pain Intervention(s): Medication (See eMAR)    Therapy/Group: Individual Therapy  Alta Goding  Ryelynn Guedea, PTA  09/21/2020, 4:09 PM

## 2020-09-21 NOTE — Discharge Instructions (Addendum)
Inpatient Rehab Discharge Instructions  Connor [name] Discharge date and time: No discharge date for patient encounter.   Activities/Precautions/ Functional Status: Activity: activity as tolerated Diet: diabetic diet Wound Care: routine skin checks Functional status:  ___ No restrictions     ___ Walk up steps independently ___ 24/7 supervision/assistance   ___ Walk up steps with assistance ___ Intermittent supervision/assistance  ___ Bathe/dress independently ___ Walk with walker     _x__ Bathe/dress with assistance ___ Walk Independently    ___ Shower independently ___ Walk with assistance    ___ Shower with assistance ___ No alcohol     ___ Return to work/school ________  COMMUNITY REFERRALS UPON DISCHARGE:    Home Health:   PT    OT     ST  RN                 Agency: [facility]   Medical Equipment/Items Ordered: Wheelchair, Agricultural consultant, Bedside Commode, Tub Advertising copywriter (Patient prefers delivery at home)                                                 Agency/Supplier: [employer]   Special Instructions:  No driving smoking or alcohol  Dry dressing nonadherent dressing with Kerlix over it twice daily as needed to the AKA site if saturated.  May cleanse stump with antibacterial soap and water and place Eucerin on flaky areas then apply shrinker with belt  My questions have been answered and I understand these instructions. I will adhere to these goals and the provided educational materials after my discharge from the hospital.  Patient/Caregiver Signature _______________________________ Date __________  Clinician Signature _______________________________________ Date __________  Please bring this form and your medication list with you to all your follow-up doctor's appointments.   =====================================================================================================  Information on my medicine - ELIQUIS (apixaban)  Why was Eliquis  prescribed for you? Eliquis was prescribed for you to reduce the risk of a blood clot forming that can cause a stroke if you have a medical condition called atrial fibrillation (a type of irregular heartbeat).  What do You need to know about Eliquis ? Take your Eliquis TWICE DAILY - one tablet in the morning and one tablet in the evening with or without food. If you have difficulty swallowing the tablet whole please discuss with your pharmacist how to take the medication safely.  Take Eliquis exactly as prescribed by your doctor and DO NOT stop taking Eliquis without talking to the doctor who prescribed the medication.  Stopping may increase your risk of developing a stroke.  Refill your prescription before you run out.  After discharge, you should have regular check-up appointments with your healthcare provider that is prescribing your Eliquis.  In the future your dose may need to be changed if your kidney function or weight changes by a significant amount or as you get older.  What do you do if you miss a dose? If you miss a dose, take it as soon as you remember on the same day and resume taking twice daily.  Do not take more than one dose of ELIQUIS at the same time to make up a missed dose.  Important Safety Information A possible side effect of Eliquis is bleeding. You should call your healthcare provider right away if you experience any of the following: ? Bleeding  from an injury or your nose that does not stop. ? Unusual colored urine (red or dark brown) or unusual colored stools (red or black). ? Unusual bruising for unknown reasons. ? A serious fall or if you hit your head (even if there is no bleeding).  Some medicines may interact with Eliquis and might increase your risk of bleeding or clotting while on Eliquis. To help avoid this, consult your healthcare provider or pharmacist prior to using any new prescription or non-prescription medications, including herbals, vitamins,  non-steroidal anti-inflammatory drugs (NSAIDs) and supplements.  This website has more information on Eliquis (apixaban): http://www.eliquis.com/eliquis/home

## 2020-09-21 NOTE — Progress Notes (Signed)
Physical Therapy Session Note  Patient Details  Name: Connor [name] MRN: [id number] Date of Birth: Jan 12, 1956  Today's Date: 09/21/2020 PT Individual Time: 0805-0900; 06-1154 PT Individual Time Calculation (min): 55 min and 55 mins  Short Term Goals: Week 1:  PT Short Term Goal 1 (Week 1): Patient will complete all bed mobility with no more CGA PT Short Term Goal 2 (Week 1): Patient will complete bed<> wc transfer with no more than CGA consistently and LRAD PT Short Term Goal 3 (Week 1): Patient will tolerate standing >59mins with LRAD and no more than CGA PT Short Term Goal 4 (Week 1): Patient will be able to teachback s/s of poor wound healing/infection in residual limb  Skilled Therapeutic Interventions/Progress Updates:    Session 1: Patient received supine in bed, agreeable to PT. He reports 9/10 pain in L RL requesting pain rx. PT alerting RN who provided pain rx and morning rx. He was able to come sit edge of bed with supervision. Requesting to use bathroom. He was able to transfer to [facility] with CGA via lateral scoot. Patient continent of bladder and bowel and was able to complete clothing management and perihygiene seated with supervision. He was able to complete sit <> stand with RW and CGA and transfer to bed via stand pivot with RW and CGA. Dressing noted to have blood-tinged drainage. RN alerted to assist observe wound and assist with dressing change. At end of session, patient supine in bed with RN present to change dressing.    Session 2: Patient received supine in bed, agreeable to PT. He reports 9/10 pain in L RL- patient providing repositioning and distractions to assist with pain management. He was able to come sit edge of bed with supervision and transfer to wc via lateral scoot with CGA. Patient propelling himself in wc using B UE and R LE with supervision x154ft. Patient with short, inefficient strokes and minimally responsive to verbal cues to increase efficiency. Patient noted  to be audibly wheezing, but O2 >96% throughout session. Patient transferring to therapy mat via stand pivot with RW and CGA. Following therex on mat: prone L hip extension 3x10, sidelying L hip abduction 3x10, supine L hip flexion 3x10, seated chest press 4# dowel 3x10, seated shoulder press 4# dowel. Patient noting appropriate sitting balance and right reactions with slight perturbations. He was able to transfer back to wc via stand pivot with RW and CGA. Returning to room in wc, seatbelt alarm on, call light within reach.   Therapy Documentation Precautions:  Precautions Precautions: Fall Precaution Comments: L AKA Restrictions Weight Bearing Restrictions: Yes LLE Weight Bearing: Non weight bearing    Therapy/Group: Individual Therapy  Elizebeth Koller, PT, DPT, CBIS  09/21/2020, 7:42 AM

## 2020-09-21 NOTE — Progress Notes (Signed)
[place] PHYSICAL MEDICINE & REHABILITATION PROGRESS NOTE   Subjective/Complaints:  Pt's BP 190/95 this AM, but previously was 140s-160s Systolic.   Pt asking for something either more frequently or something for pain prn as well as scheduled.  Getting Oxycontin 20 mg BID and Oxy 5-10 mg QID- will change to 10 mg q4 hours prn and add tramadol 50 mg q6 hours prn.   Shrinker 'not on right" per pt.  Not strapped around waist.   Peeing OK- LBM yesterday But c/o feeling wheezy.   ROS:  Pt denies  abd pain, CP, N/V/C/D, and vision changes   Objective:   No results found. Recent Labs    09/18/20 1412 09/20/20 0546  WBC 8.0 7.2  HGB 7.4* 7.5*  HCT 24.2* 23.8*  PLT 217 239   Recent Labs    09/20/20 0546  NA 139  K 4.5  CL 104  CO2 24  GLUCOSE 90  BUN 16  CREATININE 1.41*  CALCIUM 8.5*    Intake/Output Summary (Last 24 hours) at 09/21/2020 1009 Last data filed at 09/21/2020 0723 Gross per 24 hour  Intake 413 ml  Output 800 ml  Net -387 ml        Physical Exam: Vital Signs Blood pressure (!) 190/95, pulse 64, temperature 98.4 F (36.9 C), temperature source Oral, resp. rate 14, height 5\' 11"  (1.803 m), weight 113.2 kg, SpO2 93 %.  Physical Exam  General: laying in bed- getting up to EOB, nurse in room, NAD Mood and affect are  More flat, depressed Heart: RRR- no JVD Lungs: somewhat wheezing, slightly coarse, no R/R- a little tight Abdomen: Soft, NT, ND, (+)BS   Extremities: No clubbing, cyanosis, or edema L AKA Skin: No evidence of breakdown, no evidence of rash- has vertical incision on L AKA with a little drainage on backside- significant swelling, no erythema Neurologic: Ox3  Cranial nerves II through XII intact, motor strength is 5/5 in bilateral deltoid, bicep, tricep, grip, Right hip flexor, knee extensors, ankle dorsiflexor and plantar flexor, 3 - left hip flexor,  Sensory exam normal sensation to light touch  in bilateral upper and lower  extremities Cerebellar exam normal finger to nose to finger as well as heel to shin in bilateral upper and lower extremities Musculoskeletal: L AKA- has VERTICAL incision- mild sanguinous drainage from posterior aspect of incision- very dry/flaking a lot- sutures in place- significant swelling. Took picture, but didn't go into chart.     Assessment/Plan: 1. Functional deficits which require 3+ hours per day of interdisciplinary therapy in a comprehensive inpatient rehab setting.  Physiatrist is providing close team supervision and 24 hour management of active medical problems listed below.  Physiatrist and rehab team continue to assess barriers to discharge/monitor patient progress toward functional and medical goals  Care Tool:  Bathing    Body parts bathed by patient: Right arm,Left arm,Chest,Abdomen,Front perineal area,Right upper leg,Buttocks,Left upper leg,Right lower leg,Face   Body parts bathed by helper: Right lower leg Body parts n/a: Left lower leg   Bathing assist Assist Level: Minimal Assistance - Patient > 75%     Upper Body Dressing/Undressing Upper body dressing   What is the patient wearing?: Pull over shirt    Upper body assist Assist Level: Supervision/Verbal cueing    Lower Body Dressing/Undressing Lower body dressing            Lower body assist Assist for lower body dressing:  (unable to observe at eval d/t time contraints)  Toileting Toileting    Toileting assist Assist for toileting: Minimal Assistance - Patient > 75%     Transfers Chair/bed transfer  Transfers assist     Chair/bed transfer assist level: Minimal Assistance - Patient > 75%     Locomotion Ambulation   Ambulation assist      Assist level: Minimal Assistance - Patient > 75% Assistive device: Walker-rolling Max distance: 15   Walk 10 feet activity   Assist     Assist level: Minimal Assistance - Patient > 75% Assistive device: Walker-rolling   Walk 50  feet activity   Assist Walk 50 feet with 2 turns activity did not occur: Safety/medical concerns         Walk 150 feet activity   Assist Walk 150 feet activity did not occur: Safety/medical concerns         Walk 10 feet on uneven surface  activity   Assist Walk 10 feet on uneven surfaces activity did not occur: Safety/medical concerns         Wheelchair     Assist Will patient use wheelchair at discharge?: Yes Type of Wheelchair: Manual    Wheelchair assist level: Moderate Assistance - Patient 50 - 74% Max wheelchair distance: 150'    Wheelchair 50 feet with 2 turns activity    Assist        Assist Level: Minimal Assistance - Patient > 75%   Wheelchair 150 feet activity     Assist      Assist Level: Moderate Assistance - Patient 50 - 74%   Blood pressure (!) 190/95, pulse 64, temperature 98.4 F (36.9 C), temperature source Oral, resp. rate 14, height 5\' 11"  (1.803 m), weight 113.2 kg, SpO2 93 %.  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  -patient may  shower -ELOS/Goals: 10-12d Sup/ModI goals 2. Antithrombotics: -DVT/anticoagulation:Chronic Eliquis has been resumed. -antiplatelet therapy:  3. Pain Management:Voltaren gel 4 times daily, Neurontin 400 mg 3 times daily, OxyContin CR 20 mg every 12 hours, oxycodone as needed, Robaxin 1000 mg every 6 hours as needed  1/25- on gabapentin - for phantom pain- will add Cymbalta 30 mg QHS for 4 days,then 60 mg QHS. Con't Oxycontin and Oxycodone.  1/26- will change Oxycodone to q4 hours prn 10 mg- and add Tramadol 50 mg q6 hours prn.   4. Mood:Provide emotional support -antipsychotic agents: N/A 5. Neuropsych: This patientiscapable of making decisions on hisown behalf. 6. Skin/Wound Care:Routine skin checks  1/24- eucerin BID added 7.  Fluids/Electrolytes/Nutrition:Routine in and outs with follow-up chemistries 8. Severe normocytic anemia. Follow-up CBC. Follow-up hematology services  1/25- Hb 7.5- stable so far.  9. Atrial fibrillation. Cardizem 240 mg daily, continue Eliquis. 10. 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 Latest creatinine 1.45, total CK 202. Follow-up renal services  1/25- will recheck in AM 11. Hypertension. Aldactone 25 mg dailyNormodyne 400 mg twice daily. Monitor with increased mobility  1/24- BP 145/91- slightly elevated- but will monitor since on multiple meds.  1/26- BP 190/95- will give prn-  Hydralazine for very elevated Bps.  Will add 25 mg q6 hours for SBP >180 or DBP >110.  12. Aortic dissection with repair. Follow-up outpatient 13. Obesity. Dietary follow-up 14. Bronchospasms/wheezing  1/26- will add Albuterol nebs q4 hours prn- pt needs to get this AM.   LOS: 2 days A FACE TO FACE EVALUATION WAS PERFORMED  Connor [name] 09/21/2020, 10:09 AM

## 2020-09-22 ENCOUNTER — Inpatient Hospital Stay (HOSPITAL_COMMUNITY): Payer: Medicaid Other

## 2020-09-22 ENCOUNTER — Inpatient Hospital Stay (HOSPITAL_COMMUNITY): Payer: Medicaid Other | Admitting: Physical Therapy

## 2020-09-22 MED ORDER — TRAMADOL HCL 50 MG PO TABS
50.0000 mg | ORAL_TABLET | Freq: Four times a day (QID) | ORAL | Status: DC
  Administered 2020-09-22 – 2020-10-01 (×30): 50 mg via ORAL
  Filled 2020-09-22 (×33): qty 1

## 2020-09-22 MED ORDER — HYDRALAZINE HCL 10 MG PO TABS
10.0000 mg | ORAL_TABLET | Freq: Three times a day (TID) | ORAL | Status: DC
  Administered 2020-09-22 – 2020-09-25 (×9): 10 mg via ORAL
  Filled 2020-09-22 (×9): qty 1

## 2020-09-22 NOTE — Progress Notes (Signed)
Occupational Therapy Session Note  Patient Details  Name: Connor [name] MRN: [id number] Date of Birth: [birth year]  Today's Date: 09/22/2020 OT Individual Time: 1045-1100 OT Individual Time Calculation (min): 15 min  and Today's Date: 09/22/2020 OT Missed Time: 60 Minutes Missed Time Reason: Pain   Short Term Goals: Week 1:  OT Short Term Goal 1 (Week 1): Pt will perform BSC/toilet transfers with CGA OT Short Term Goal 2 (Week 1): Pt will perform LB bathe/dress with CGA using AE PRN OT Short Term Goal 3 (Week 1): Pt will perform ADL/activity for 15+ minutes without rest break OT Short Term Goal 4 (Week 1): Pt will perform clothing management standing with CGA  Skilled Therapeutic Interventions/Progress Updates:    Pt sleeping in bed upon arrival. Min verbal cues to arouse. Pt with difficulty keeping eyes open. Pt stated RN had just changed dressing on LLE and he was in considerable pain. Pt stated his Rt foot was very painful. RN Clydie Braun) stated she had administered pain meds prior to therapy. Pt unable to actively participate in therapy 2/2 pain and lethargy (pain meds). Assisted pt with repositioning-max A. Emotional support provided. Pt remained in bed with all needs within reach and bed alarm activated.   Therapy Documentation Precautions:  Precautions Precautions: Fall Precaution Comments: L AKA Restrictions Weight Bearing Restrictions: No LLE Weight Bearing: Non weight bearing General: General OT Amount of Missed Time: 60 Minutes Pain: Pain Assessment Pain Score: 9/10 LLE and Rt foot; RN aware and premedicated, repositioned  Therapy/Group: Individual Therapy  Rich Brave 09/22/2020, 11:38 AM

## 2020-09-22 NOTE — Progress Notes (Signed)
Physical Therapy Session Note  Patient Details  Name: Connor [name] MRN: [id number] Date of Birth: 11/08/1955  Today's Date: 09/22/2020 PT Individual Time:  -      Short Term Goals: Week 1:  PT Short Term Goal 1 (Week 1): Patient will complete all bed mobility with no more CGA PT Short Term Goal 2 (Week 1): Patient will complete bed<> wc transfer with no more than CGA consistently and LRAD PT Short Term Goal 3 (Week 1): Patient will tolerate standing >63mins with LRAD and no more than CGA PT Short Term Goal 4 (Week 1): Patient will be able to teachback s/s of poor wound healing/infection in residual limb  Skilled Therapeutic Interventions/Progress Updates:    pt received in bed and agreeable to therapy. Pt reported pain at residual limb 8/10 but reported he had pain medicine. Pt directed in supine>sit EOB CGA with HOB slightly elevated and bed rail used. Pt sat EOB SBA and directed in Sit to stand and Stand pivot transfer to [facility] with Rolling walker CGA with extra time to complete. Pt directed in WC mobility to day room 150' with 2 short brief rest breaks, CGA. Pt then directed in seated BLE strengthening exercises 3# on RLE and no added weight on LLE 2/2 pain tolerance, of hip adduction, hip abduction, hip flexion, knee extension and knee flexion (RLE only for final exercise) 3x10. Pt benefited from [facility] for breathing technique and rest breaks intermittently. Pt directed in x3 Sit to stand to Rolling walker at min A with mild posterior lean with initial standing, limited in completing final 2 reps 2/2 pain in lateral R foot. PT educated pt on importance of shoe and pt reported he would ask someone to bring one and he agreed. Pt directed in WC mobility back to room 200' CGA 2 rest breaks. Pt then directed in Stand pivot transfer to bedside min A and CGA for sit>supine. Pt left in bed, alarm set, All needs in reach and in good condition. Call light in hand.  All needs in reach and in good condition. Call  light in hand.  And nursing present.   Therapy Documentation Precautions:  Precautions Precautions: Fall Precaution Comments: L AKA Restrictions Weight Bearing Restrictions: Yes LLE Weight Bearing: Non weight bearing General:   Vital Signs: Therapy Vitals Temp: 98.2 F (36.8 C) Pulse Rate: 61 Resp: 18 BP: (!) 160/81 Patient Position (if appropriate): Lying Oxygen Therapy SpO2: 93 % O2 Device: Room Air Pain:   Mobility:   Locomotion :    Trunk/Postural Assessment :    Balance:   Exercises:   Other Treatments:      Therapy/Group: Individual Therapy  Barbaraann Faster 09/22/2020, 8:21 AM

## 2020-09-22 NOTE — Progress Notes (Signed)
Physical Therapy Session Note  Patient Details  Name: Connor [name] MRN: [id number] Date of Birth: May 09, 1956  Today's Date: 09/22/2020 PT Individual Time: 8638-1771 PT Individual Time Calculation (min): 55 min   Short Term Goals: Week 1:  PT Short Term Goal 1 (Week 1): Patient will complete all bed mobility with no more CGA PT Short Term Goal 2 (Week 1): Patient will complete bed<> wc transfer with no more than CGA consistently and LRAD PT Short Term Goal 3 (Week 1): Patient will tolerate standing >68mns with LRAD and no more than CGA PT Short Term Goal 4 (Week 1): Patient will be able to teachback s/s of poor wound healing/infection in residual limb  Skilled Therapeutic Interventions/Progress Updates:    Pt received supine in bed and agreeable to PT. Supine>sit transfer with supervision assist and cues for use of bed rails as needed. Pt reporting that his residual limb was too painful to participate in therapy, but then agreeable to bed level therex. PT obtained bar weight and tband and returned to pt room.   Pt reporting that limb wrap had fallen off. PT replaced dressing and wrap with great effort due to shape of residual limb, with added wrap around pelvis to reduce chance of dressing falling off again.   Pt performed BLE SLR hip abduction, hip adduction, side lying hip extension and glute set. Each performed x 12. RLE ankle PF into foot of bed, ankle DF, heel slides, partial bridge x 15 each . Cues for improved hold at end range and decreased speed throughout.    UE therex: chest press with 2# bar weight, lat pull down level 2 tband, tricep extension, mid row level 2 tband. Each completed x 12 BUE with cues for decreased speed. Pt then left supine in bed with call bell in reach and all needs met.      Therapy Documentation Precautions:  Precautions Precautions: Fall Precaution Comments: L AKA Restrictions Weight Bearing Restrictions: No LLE Weight Bearing: Non weight  bearing Vital Signs: Therapy Vitals Temp: 98.1 F (36.7 C) Pulse Rate: (!) 53 Resp: 18 BP: 135/74 Patient Position (if appropriate): Lying Oxygen Therapy SpO2: 94 % O2 Device: Room Air Pain: Pain Assessment Pain Scale: 0-10 Pain Score: 8  Pain Type: Surgical pain Pain Location: Leg Pain Orientation: Left Pain Descriptors / Indicators: Cramping Pain Onset: On-going Pain Intervention(s): Repositioned 2nd Pain Site Pain Score: 5    Therapy/Group: Individual Therapy  ALorie Phenix1/27/2022, 4:35 PM

## 2020-09-22 NOTE — IPOC Note (Signed)
Overall Plan of Care [facility]) Patient Details Name: Mccauley Diehl MRN: [id number] DOB: 1956-01-28  Admitting Diagnosis: History of left above knee amputation [facility])  Hospital Problems: Principal Problem:   History of left above knee amputation (HCC) Active Problems:   Left above-knee amputee [facility])     Functional Problem List: Nursing Bladder,Bowel,Endurance,Medication Management,Motor,Pain,Safety,Skin Integrity  PT Balance,Safety,Sensory,Skin Integrity,Edema,Endurance,Nutrition,Pain  OT Balance,Edema,Endurance,Motor,Pain,Perception,Safety,Sensory  SLP    TR         Basic ADL's: OT Grooming,Bathing,Dressing,Toileting     Advanced  ADL's: OT       Transfers: PT Bed Mobility,Bed to Chair,Car  OT Toilet,Tub/Shower     Locomotion: PT Ambulation,Wheelchair Mobility,Stairs     Additional Impairments: OT None  SLP        TR      Anticipated Outcomes Item Anticipated Outcome  Self Feeding no goal  Swallowing      Basic self-care  Supervision  Toileting  Mod I   Bathroom Transfers Supervision  Bowel/Bladder  Patient to be continent of bowel/bladder with Mod I  Transfers  supervision  Locomotion  supervision  Communication     Cognition     Pain  <2  Safety/Judgment  Able to call for help and express needs   Therapy Plan: PT Intensity: Minimum of 1-2 x/day ,45 to 90 minutes PT Frequency: 5 out of 7 days PT Duration Estimated Length of Stay: 10-14 days OT Intensity: Minimum of 1-2 x/day, 45 to 90 minutes OT Frequency: 5 out of 7 days OT Duration/Estimated Length of Stay: 10-14     Due to the current state of emergency, patients may not be receiving their 3-hours of Medicare-mandated therapy.   Team Interventions: Nursing Interventions Patient/Family Education,Pain Management,Bladder Management,Bowel Management,Skin Care/Wound Management,Medication Management,Discharge Planning,Disease Management/Prevention  PT interventions Ambulation/gait  training,Cognitive remediation/compensation,Discharge planning,DME/adaptive equipment instruction,Functional mobility training,Pain management,Psychosocial support,Splinting/orthotics,Therapeutic Activities,UE/LE Strength taining/ROM,Visual/perceptual remediation/compensation,Wheelchair propulsion/positioning,UE/LE Coordination activities,Therapeutic Exercise,Stair training,Patient/family education,Skin care/wound management,Neuromuscular re-education,Disease Quarry manager  OT Interventions Balance/vestibular training,Discharge planning,Pain management,Self Care/advanced ADL retraining,Therapeutic Activities,UE/LE Coordination activities,Visual/perceptual remediation/compensation,Therapeutic Exercise,Skin care/wound managment,Patient/family education,Functional mobility training,Disease Nutritional therapist re-education,Psychosocial support,Splinting/orthotics,UE/LE Strength taining/ROM,Wheelchair propulsion/positioning  SLP Interventions    TR Interventions    SW/CM Interventions Discharge Planning,Psychosocial Support,Patient/Family Education,Disease Management/Prevention   Barriers to Discharge MD  Medical stability, Home enviroment access/loayout, Wound care, Lack of/limited family support, Weight, Weight bearing restrictions, Medication compliance and depressed and has draining from L AKA  Nursing Inaccessible home environment,Decreased caregiver support,Home environment access/layout,Incontinence,Wound Care,Lack of/limited family support,Weight bearing restrictions,Medication compliance    PT Inaccessible home environment,Decreased caregiver support,Home environment access/layout,Wound Care,Weight bearing restrictions,Nutrition means,Weight,Lack of/limited family support,Medication compliance listed above.  OT      SLP      SW Insurance for SNF coverage,Wound  Care,Home environment access/layout Patient Home not WC Accessible, cousin unable to provide physcial assistance   Team Discharge Planning: Destination: PT-Home ,OT- Home , SLP-  Projected Follow-up: PT-Home health PT, OT-  Home health OT, SLP-  Projected Equipment Needs: PT-To be determined, OT- 3 in 1 bedside comode,Tub/shower bench,To be determined, SLP-  Equipment Details: PT- , OT-will need TTB - unclear yet where pt wants to purchase Patient/family involved in discharge planning: PT- Patient,  OT-Patient, SLP-   MD ELOS: 10-14 days Medical Rehab Prognosis:  Good Assessment: Pt is a 65 yr old male with  Of Afib on Eliquis, CKD 2 , HTN poorly controlled, obesity, and 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   Goals mod I to supervision in 10-14 days  See Team Conference Notes for weekly updates to the plan of care

## 2020-09-22 NOTE — Progress Notes (Signed)
[facility] PHYSICAL MEDICINE & REHABILITATION PROGRESS NOTE   Subjective/Complaints:  Pt reports pain is doing better/somewhat this AM- more "manageable"- but still rating 8/10 to therapy.   Said L AKA still bleeding some "underneath". Will assess today.   Of note, was on no pain meds prior to being in hospital.    ROS:  Pt denies SOB, abd pain, CP, N/V/C/D, and vision changes   Objective:   No results found. Recent Labs    09/20/20 0546  WBC 7.2  HGB 7.5*  HCT 23.8*  PLT 239   Recent Labs    09/20/20 0546  NA 139  K 4.5  CL 104  CO2 24  GLUCOSE 90  BUN 16  CREATININE 1.41*  CALCIUM 8.5*    Intake/Output Summary (Last 24 hours) at 09/22/2020 6237 Last data filed at 09/22/2020 0815 Gross per 24 hour  Intake 236 ml  Output 1460 ml  Net -1224 ml        Physical Exam: Vital Signs Blood pressure (!) 160/81, pulse 61, temperature 98.2 F (36.8 C), resp. rate 18, height 5\' 11"  (1.803 m), weight 113.2 kg, SpO2 93 %.  Physical Exam  General: sitting up in w/c in gym, with PT, NAD Mood and affect are  Flat, slightly less depressed this AM Heart: borderline bradycardic-  Lungs: more clear, good air movement- occ wheeze heard Abdomen: Soft, NT, ND, (+)BS    Extremities: No clubbing, cyanosis, or edema L AKA Skin:  L AKA is vertical incision- was wearing shrinker with belt- has 2 small areas on backside of vertical incision that are draining clotting/type blood- looks like hematoma- no erythema- nothing to silver nitrate.  Neurologic: Ox3  Cranial nerves II through XII intact, motor strength is 5/5 in bilateral deltoid, bicep, tricep, grip, Right hip flexor, knee extensors, ankle dorsiflexor and plantar flexor, 3 - left hip flexor,  Sensory exam normal sensation to light touch  in bilateral upper and lower extremities Cerebellar exam normal finger to nose to finger as well as heel to shin in bilateral upper and lower extremities Musculoskeletal: L AKA- as  above      Assessment/Plan: 1. Functional deficits which require 3+ hours per day of interdisciplinary therapy in a comprehensive inpatient rehab setting.  Physiatrist is providing close team supervision and 24 hour management of active medical problems listed below.  Physiatrist and rehab team continue to assess barriers to discharge/monitor patient progress toward functional and medical goals  Care Tool:  Bathing    Body parts bathed by patient: Right arm,Left arm,Chest,Abdomen,Front perineal area,Right upper leg,Buttocks,Left upper leg,Right lower leg,Face   Body parts bathed by helper: Right lower leg Body parts n/a: Left lower leg   Bathing assist Assist Level: Minimal Assistance - Patient > 75% (R foot for thoroughness, almost CGA)     Upper Body Dressing/Undressing Upper body dressing   What is the patient wearing?: Pull over shirt    Upper body assist Assist Level: Set up assist    Lower Body Dressing/Undressing Lower body dressing      What is the patient wearing?: Pants     Lower body assist Assist for lower body dressing: Contact Guard/Touching assist     Toileting Toileting    Toileting assist Assist for toileting: Minimal Assistance - Patient > 75%     Transfers Chair/bed transfer  Transfers assist     Chair/bed transfer assist level: Minimal Assistance - Patient > 75% Chair/bed transfer assistive device:  Ambulation assist      Assist level: Minimal Assistance - Patient > 75% Assistive device: Walker-rolling Max distance: 15   Walk 10 feet activity   Assist     Assist level: Minimal Assistance - Patient > 75% Assistive device: Walker-rolling   Walk 50 feet activity   Assist Walk 50 feet with 2 turns activity did not occur: Safety/medical concerns         Walk 150 feet activity   Assist Walk 150 feet activity did not occur: Safety/medical concerns         Walk 10 feet on uneven  surface  activity   Assist Walk 10 feet on uneven surfaces activity did not occur: Safety/medical concerns         Wheelchair     Assist Will patient use wheelchair at discharge?: Yes Type of Wheelchair: Manual    Wheelchair assist level: Supervision/Verbal cueing Max wheelchair distance: 150'    Wheelchair 50 feet with 2 turns activity    Assist        Assist Level: Supervision/Verbal cueing   Wheelchair 150 feet activity     Assist      Assist Level: Supervision/Verbal cueing   Blood pressure (!) 160/81, pulse 61, temperature 98.2 F (36.8 C), resp. rate 18, height 5\' 11"  (1.803 m), weight 113.2 kg, SpO2 93 %.  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  -patient may  shower -ELOS/Goals: 10-12d Sup/ModI goals 2. Antithrombotics: -DVT/anticoagulation:Chronic Eliquis has been resumed. -antiplatelet therapy:  3. Pain Management:Voltaren gel 4 times daily, Neurontin 400 mg 3 times daily, OxyContin CR 20 mg every 12 hours, oxycodone as needed, Robaxin 1000 mg every 6 hours as needed  1/25- on gabapentin - for phantom pain- will add Cymbalta 30 mg QHS for 4 days,then 60 mg QHS. Con't Oxycontin and Oxycodone.  1/26- will change Oxycodone to q4 hours prn 10 mg- and add Tramadol 50 mg q6 hours prn.   1/27- pt reports more manageable, but will encourage him to ask for meds. However not asked for tramadol YET_ will schedule tramadol and con't oxy prn q4 hours prn   4. Mood:Provide emotional support -antipsychotic agents: N/A 5. Neuropsych: This patientiscapable of making decisions on hisown behalf. 6. Skin/Wound Care:Routine skin checks  1/24- eucerin BID added  1/27- will change dressing to BID and prn if saturated 7. Fluids/Electrolytes/Nutrition:Routine in and outs with follow-up chemistries 8. Severe  normocytic anemia. Follow-up CBC. Follow-up hematology services  1/25- Hb 7.5- stable so far.   1/27- will recheck labs in AM, esp since draining from L AKA incision 9. Atrial fibrillation. Cardizem 240 mg daily, continue Eliquis. 10. 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 Latest creatinine 1.45, total CK 202. Follow-up renal services  1/27- will recheck in AM 11. Hypertension. Aldactone 25 mg dailyNormodyne 400 mg twice daily. Monitor with increased mobility  1/24- BP 145/91- slightly elevated- but will monitor since on multiple meds.  1/26- BP 190/95- will give prn-  Hydralazine for very elevated Bps.  Will add 25 mg q6 hours for SBP >180 or DBP >110.   1/27- BP 160s/81 this AM-will add  hydralazine scheduled for BP 10 mg TID 12. Aortic dissection with repair. Follow-up outpatient 13. Obesity. Dietary follow-up 14. Bronchospasms/wheezing  1/26- will add Albuterol nebs q4 hours prn- pt needs to get this AM.   1/27- will encourage nursing to do/ask for if pt has Sx's.   LOS: 3  days A FACE TO FACE EVALUATION WAS PERFORMED  Matteo Banke 09/22/2020, 9:27 AM

## 2020-09-23 LAB — CBC WITH DIFFERENTIAL/PLATELET
Abs Immature Granulocytes: 0.03 10*3/uL (ref 0.00–0.07)
Basophils Absolute: 0 10*3/uL (ref 0.0–0.1)
Basophils Relative: 1 %
Eosinophils Absolute: 0.3 10*3/uL (ref 0.0–0.5)
Eosinophils Relative: 5 %
HCT: 26.2 % — ABNORMAL LOW (ref 39.0–52.0)
Hemoglobin: 7.9 g/dL — ABNORMAL LOW (ref 13.0–17.0)
Immature Granulocytes: 0 %
Lymphocytes Relative: 11 %
Lymphs Abs: 0.8 10*3/uL (ref 0.7–4.0)
MCH: 29.2 pg (ref 26.0–34.0)
MCHC: 30.2 g/dL (ref 30.0–36.0)
MCV: 96.7 fL (ref 80.0–100.0)
Monocytes Absolute: 0.7 10*3/uL (ref 0.1–1.0)
Monocytes Relative: 9 %
Neutro Abs: 5.5 10*3/uL (ref 1.7–7.7)
Neutrophils Relative %: 74 %
Platelets: 227 10*3/uL (ref 150–400)
RBC: 2.71 MIL/uL — ABNORMAL LOW (ref 4.22–5.81)
RDW: 16.7 % — ABNORMAL HIGH (ref 11.5–15.5)
WBC: 7.4 10*3/uL (ref 4.0–10.5)
nRBC: 0 % (ref 0.0–0.2)

## 2020-09-23 LAB — BASIC METABOLIC PANEL
Anion gap: 10 (ref 5–15)
BUN: 15 mg/dL (ref 8–23)
CO2: 25 mmol/L (ref 22–32)
Calcium: 8.5 mg/dL — ABNORMAL LOW (ref 8.9–10.3)
Chloride: 102 mmol/L (ref 98–111)
Creatinine, Ser: 1.28 mg/dL — ABNORMAL HIGH (ref 0.61–1.24)
GFR, Estimated: >60 mL/min (ref >60)
Glucose, Bld: 111 mg/dL — ABNORMAL HIGH (ref 70–99)
Potassium: 3.6 mmol/L (ref 3.5–5.1)
Sodium: 137 mmol/L (ref 135–145)

## 2020-09-23 MED ORDER — OXYCODONE HCL ER 10 MG PO T12A
10.0000 mg | EXTENDED_RELEASE_TABLET | Freq: Once | ORAL | Status: AC
  Administered 2020-09-23: 10 mg via ORAL
  Filled 2020-09-23: qty 1

## 2020-09-23 MED ORDER — OXYCODONE HCL ER 10 MG PO T12A
30.0000 mg | EXTENDED_RELEASE_TABLET | Freq: Two times a day (BID) | ORAL | Status: DC
Start: 2020-09-23 — End: 2020-10-01
  Administered 2020-09-23 – 2020-10-01 (×15): 30 mg via ORAL
  Filled 2020-09-23 (×17): qty 3

## 2020-09-23 NOTE — Progress Notes (Signed)
Physical Therapy Session Note  Patient Details  Name: Connor [name] MRN: [id number] Date of Birth: [birth year]  Today's Date: 09/23/2020 PT Individual Time: 0920-1000 PT Individual Time Calculation (min): 40 min   Short Term Goals: Week 1:  PT Short Term Goal 1 (Week 1): Patient will complete all bed mobility with no more CGA PT Short Term Goal 2 (Week 1): Patient will complete bed<> wc transfer with no more than CGA consistently and LRAD PT Short Term Goal 3 (Week 1): Patient will tolerate standing >51mins with LRAD and no more than CGA PT Short Term Goal 4 (Week 1): Patient will be able to teachback s/s of poor wound healing/infection in residual limb Week 2:    Week 3:     Skilled Therapeutic Interventions/Progress Updates:    PAIN 7/10 residual limb pain.  Dressing changed and shrinker applied.  Treatment to tolerance.  Pt initially supine and agreeable to session for makeup time due to mult missed session/min during week.  Pt w/visible bloody drainage from wound.  Shrinker/dressings removed and nursing notified of drainage.  New dressing applied + acewrap due to excessive drainage only + shrinker.  Mult attempts required due to difficulty securing in place.  Pt able to bridge and roll L/R w/rails and cues only.   Pt able to lift RLE for therapist to thread pants, lifts L residual limb, rolls w/cues only for completion of lower body dressing.   Pt supine to sit w/rails and supervision, additional time. Sit to stand to RW w/bed elevated and cga. stand pivot transfer w/RW w/cga, cues, additional time. Pt handed off to OT for continued therapeutic treatment. Pt left oob in wc w/alarm belt set and needs in reach   Therapy Documentation Precautions:  Precautions Precautions: Fall Precaution Comments: L AKA Restrictions Weight Bearing Restrictions: No LLE Weight Bearing: Non weight bearing    Therapy/Group: Individual Therapy  [name], PT   [name] 09/23/2020, 12:55 PM

## 2020-09-23 NOTE — Consult Note (Signed)
Neuropsychological Consultation   Patient:   Connor [name]   DOB:   [birth year]  MR Number:  [id number]  Location:  [facility] [address] CENTER A [id number] [place] [address] Dept: [phone] Loc: [phone]           Date of Service:   09/23/2020  Start Time:   9 AM End Time:   10 AM  Provider/Observer:  Arley Phenix, Psy.D.       Clinical Neuropsychologist       Billing Code/Service: [phone]  Chief Complaint:    Connor [name] is a 65 year old male with a history of aortic dissection with several follow-up complicated surgeries including redo thoracic aortic repair as well as sternal infection, A. fib, CAD with CABG, chronic kidney disease stage II, hypertension and obesity.  Patient presented on 08/30/2020 with persistent nausea/vomiting and diarrhea over the prior 5 days.  Patient with fever of 102.7.  Work-up indicated possibly cellulitis or nonspecific lower extremity edema.  Patient placed on broad-spectrum antibiotics for suspected sepsis/cellulitis.  Followed up by Dr. Lajoyce Corners for necrotizing fasciitis left leg wound which was not felt to be salvageable and patient underwent left AKA on 09/08/2019 with revision on 09/09/2020 and placement of wound VAC.  Hospital course included multiple transfusions for anemia.  Patient referred for CIR due to decreased functional ability and multiple medical issues.  Reason for Service:  Patient was referred for neuropsychological consultation due to coping and adjustment issues following left AKA.  Below is the HPI for the current admission.  Connor [name] is a 64 year old right-handed male with history of aortic dissection with several complicated surgery including redo thoracic aortic repair as well as sternal infection, atrial fibrillation on Eliquis, CAD with CABG, CKD stage II-3, hypertension, obesity. Per chart review patient lives with his autistic cousinin a  mobile home ramped entrance. Independent with assistive device. Community ambulator with straight point cane and driving. . Presented 08/30/2020 with persistent nausea vomiting diarrhea x5 days. In the ED noted fever 102.7 he was tachycardic. Chemistry showed a potassium 2.8 glucose 162 BUN 55 creatinine 4.21 from baseline of 1.38, WBC 15,600, hemoglobin 10.5, platelets 136,000, lactic acid 2.1 troponin 425-482, blood cultures GPC x2 Streptococcus urine cultureE Faecalis, CK 28,083.Marland Kitchen Chest x-ray showed no acute intrathoracic process. Patient noted edema and swelling of the left lower extremity. CT of left lower extremity showed no fracture or malalignment. No significant hip or knee effusion however there was moderate subcutaneous edema within the left lower extremity extending from the hip to the imaged proximal lower leg possibly representing cellulitis or nonspecific lower extremity edema. No gas containing fluid collections. Multiple enlarged left inguinal lymph nodes the largest measuring up to 3 cm. Nephrology consulted for AKI on CKD with rhabdomyolysis and felt AKI likely secondary to acute tubular injury decreased renal perfusion related to hypotension. Placed on gentle IV fluids and renal functions much improved with latest creatinine 1.45 and latest CK 202.Marland Kitchen Patient had been maintained on broad-spectrum antibiotics for suspected sepsis/cellulitis. Orthopedic service follow-up Dr. 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 Hospital course patient required multiple transfusions for anemia. His chronic anticoagulation was resumed. Hematology service Dr. Pamelia Hoit consulted in regards to anemia felt to be multifactorial and work-up with iron studies and reticulocyte count with latest hemoglobin 7.4, platelets 217,000. No current indications for bone marrow biopsies. Therapy  evaluations completed due to patient's decreased functional ability/multimedical he was admitted for a comprehensive rehab program  Current Status:  The patient was laying in his bed in a slightly elevated position.  He was somewhat drowsy and speech at times was difficult to understand due to likely longstanding expressive language styles.  Patient denied significant depression or anxiety but did acknowledge stress with all of his multiple medical complications that he has been dealing with over the past several years.  Patient frustrated by ongoing loss of function but is actively working on therapeutic interventions following his left AKA.  Patient complained of ongoing pain both at surgical site as well as phantom limb syndrome.  Behavioral Observation: [name]  presents as a 65 y.o.-year-old Right African American Male who appeared his stated age. his dress was Appropriate and he was Well Groomed and his manners were Appropriate to the situation.  his participation was indicative of Appropriate and Redirectable behaviors.  There were physical disabilities noted.  he displayed an appropriate level of cooperation and motivation.     Interactions:    Active Appropriate and Redirectable  Attention:   abnormal and attention span appeared shorter than expected for age  Memory:   within normal limits; recent and remote memory intact  Visuo-spatial:  not examined  Speech (Volume):  normal  Speech:   normal; slurred  Thought Process:  Coherent and Relevant  Though Content:  WNL; not suicidal and not homicidal  Orientation:   person, place and time/date  Judgment:   Fair  Planning:   Poor  Affect:    Flat and Lethargic  Mood:    Dysphoric  Insight:   Fair  Intelligence:   normal  Medical History:   Past Medical History:  Diagnosis Date  . AAA (abdominal aortic aneurysm) (HCC)   . History of open heart surgery   . Hypertension   . Seroma due to trauma [facility])           Patient Active Problem List   Diagnosis Date Noted  . Severe anemia 09/19/2020  . History of left above knee amputation (HCC) 09/19/2020  . Left above-knee amputee (HCC) 09/19/2020  . 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 CKD (chronic kidney disease), stage III (HCC) 09/01/2020  . Morbid obesity (HCC) 09/01/2020  . Severe sepsis with acute organ dysfunction due to Streptococcus species (HCC) 08/30/2020  . Cellulitis of left leg 08/30/2020  . Lactic acidosis 08/30/2020  . Elevated troponin 08/30/2020  . Hypoalbuminemia 08/30/2020  . Leukocytosis 08/30/2020  . Thrombocytopenia (HCC) 08/30/2020  . Hypokalemia 08/30/2020  . Hyperglycemia 08/30/2020  . Transaminitis 08/30/2020  . Total bilirubin, elevated 08/30/2020  . AKI (acute kidney injury) (HCC) 08/30/2020  . Nausea vomiting and diarrhea 08/30/2020  . Hypertension 05/20/2020  . Persistent atrial fibrillation (HCC)       Psychiatric History:  No prior psychiatric history  Family Med/Psych History:  Family History  Problem Relation Age of Onset  . CAD Mother   . CVA Father     Impression/DX:  Tequan Redmon is a 65 year old male with a history of aortic dissection with several follow-up complicated surgeries including redo thoracic aortic repair as well as sternal infection, A. fib, CAD with CABG, chronic kidney disease stage II, hypertension and obesity.  Patient presented on 08/30/2020 with persistent nausea/vomiting and diarrhea over the prior 5 days.  Patient with fever of 102.7.  Work-up indicated possibly cellulitis or nonspecific lower extremity edema.  Patient placed on  broad-spectrum antibiotics for suspected sepsis/cellulitis.  Followed up by Dr. Lajoyce Corners for necrotizing fasciitis left leg wound which was not felt to be salvageable and patient underwent left AKA on 09/08/2019 with revision on 09/09/2020 and placement of wound VAC.  Hospital course included multiple transfusions for anemia.   Patient referred for CIR due to decreased functional ability and multiple medical issues.  The patient was laying in his bed in a slightly elevated position.  He was somewhat drowsy and speech at times was difficult to understand due to likely longstanding expressive language styles.  Patient denied significant depression or anxiety but did acknowledge stress with all of his multiple medical complications that he has been dealing with over the past several years.  Patient frustrated by ongoing loss of function but is actively working on therapeutic interventions following his left AKA.  Patient complained of ongoing pain both at surgical site as well as phantom limb syndrome.   Disposition/Plan:  Worked on coping and adjustment issues around recent AKA and multiple medical issues.  Diagnosis:    L AKA         Electronically Signed   _______________________ Arley Phenix, Psy.D. Clinical Neuropsychologist

## 2020-09-23 NOTE — Progress Notes (Signed)
Occupational Therapy Session Note  Patient Details  Name: Connor [name] MRN: [id number] Date of Birth: 05-16-1956  Today's Date: 09/23/2020 OT Individual Time: 1000-1054 and 1450-1533 OT Individual Time Calculation (min): 54 min and 43 min   Short Term Goals: Week 1:  OT Short Term Goal 1 (Week 1): Pt will perform BSC/toilet transfers with CGA OT Short Term Goal 2 (Week 1): Pt will perform LB bathe/dress with CGA using AE PRN OT Short Term Goal 3 (Week 1): Pt will perform ADL/activity for 15+ minutes without rest break OT Short Term Goal 4 (Week 1): Pt will perform clothing management standing with CGA  Skilled Therapeutic Interventions/Progress Updates:    Pt greeted in bed with cousin present. Pt premedicated for pain, declined toileting, EOB, or OOB activity. Agreeable to bedlevel tx only due to fatigue. Therefore therapeutic focus was placed on UB strengthening and endurance, pt guided through UB exercises using 2# bar/blue theraband x10 reps 2 sets, anterior reaching to target for core strengthening. Finished session with diaphragmatic breathing, hands on belly and vcs for increasing length of expiration. Pt remained in bed at close of session, all needs within reach and bed alarm set.   Therapy Documentation Precautions:  Precautions Precautions: Fall Precaution Comments: L AKA Restrictions Weight Bearing Restrictions: No LLE Weight Bearing: Non weight bearing Vital Signs: Therapy Vitals Temp: 97.8 F (36.6 C) Temp Source: Oral Pulse Rate: (!) 56 Resp: 19 BP: (!) 160/85 Patient Position (if appropriate): Lying Oxygen Therapy SpO2: 96 % O2 Device: Room Air ADL: ADL Eating: Not assessed Grooming: Not assessed Upper Body Bathing: Supervision/safety Where Assessed-Upper Body Bathing: Edge of bed Lower Body Bathing: Minimal assistance Where Assessed-Lower Body Bathing: Edge of bed Upper Body Dressing: Supervision/safety Where Assessed-Upper Body Dressing: Edge of  bed Lower Body Dressing: Not assessed (not assessed d/t time constraints) Toileting: Minimal assistance Where Assessed-Toileting: Bedside Commode Toilet Transfer: Minimal assistance Toilet Transfer Method: Stand pivot Toilet Transfer Equipment: Bedside commode      Therapy/Group: Individual Therapy  [name] [name] 09/23/2020, 3:35 PM

## 2020-09-23 NOTE — Progress Notes (Signed)
Physical Therapy Session Note  Patient Details  Name: Zafir Schauer MRN: [id number] Date of Birth: 01-31-1956  Today's Date: 09/23/2020 PT Individual Time: 1300-1415 PT Individual Time Calculation (min): 75 min   Short Term Goals: Week 1:  PT Short Term Goal 1 (Week 1): Patient will complete all bed mobility with no more CGA PT Short Term Goal 2 (Week 1): Patient will complete bed<> wc transfer with no more than CGA consistently and LRAD PT Short Term Goal 3 (Week 1): Patient will tolerate standing >87mins with LRAD and no more than CGA PT Short Term Goal 4 (Week 1): Patient will be able to teachback s/s of poor wound healing/infection in residual limb Week 2:     Skilled Therapeutic Interventions/Progress Updates:    pt received in bed and agreeable to therapy. Pt reported 8/10 pain in residual limb. Pt directed in supine>sit CGA with bedrails and Stand pivot transfer with Rolling walker to WC min A. Pt denied additional nursing needs. Pt directed in [facility] mobility out of room however then requested to look in bags for his shoes, unable to find his shoes, then he requested to look for other personal items to insure they were not lost, all other items found and pt agreeable to leave room. Nursing made aware of inability to find shoes. Pt refused to participate in standing activity in all capacities 2/2 pain in lateral R foot and reporting until he has shoes he does not want to stand longer than to transfer, pt also denied to transfer to mat table for exercises and requested to remain in WC. Pt directed in seated BLE Strengthening exercises with 3# on RLE and no added weight on LLE, pt denied 2/2 pain. Pt directed in marching, LAQ, leg lifts on LLE and hip add/abduction 3x15 and R ankle pumps. Pt directed in  BUE strengthening exercises of overhead press, chest press, lateral lifts, and bicep curls. Pt directed in WC mobility back to room min A with tighter areas and turns for navigational assist for  200' then directed in Stand pivot transfer to return to bed at pt request, min A. Pt left in bed, alarm set, All needs in reach and in good condition. Call light in hand.    Therapy Documentation Precautions:  Precautions Precautions: Fall Precaution Comments: L AKA Restrictions Weight Bearing Restrictions: No LLE Weight Bearing: Non weight bearing General:   Vital Signs: Therapy Vitals Temp: 97.8 F (36.6 C) Temp Source: Oral Pulse Rate: (!) 56 Resp: 19 BP: (!) 160/85 Patient Position (if appropriate): Lying Oxygen Therapy SpO2: 96 % O2 Device: Room Air Pain:   Mobility:   Locomotion :    Trunk/Postural Assessment :    Balance:   Exercises:   Other Treatments:      Therapy/Group: Individual Therapy  Barbaraann Faster 09/23/2020, 3:58 PM

## 2020-09-23 NOTE — Progress Notes (Signed)
[facility] PHYSICAL MEDICINE & REHABILITATION PROGRESS NOTE   Subjective/Complaints:  Pt had a very large/good BM just now Skin is very ashy- hasn't had Eucerin on skin- at least in a couple days.   Pain is down from 9/10 to 7/10- but still unmanageable.    ROS:   Pt denies SOB, abd pain, CP, N/V/C/D, and vision changes  Objective:   No results found. No results for input(s): WBC, HGB, HCT, PLT in the last 72 hours. No results for input(s): NA, K, CL, CO2, GLUCOSE, BUN, CREATININE, CALCIUM in the last 72 hours.  Intake/Output Summary (Last 24 hours) at 09/23/2020 0818 Last data filed at 09/23/2020 0501 Gross per 24 hour  Intake 300 ml  Output 1075 ml  Net -775 ml        Physical Exam: Vital Signs Blood pressure (!) 169/87, pulse (!) 59, temperature 97.6 F (36.4 C), temperature source Oral, resp. rate 18, height 5\' 11"  (1.803 m), weight 113.2 kg, SpO2 90 %.  Physical Exam  General: laying in bed; appropriate, smiling some, NAD Mood and affect are  More interactive, less flat Heart: borderline bradycardia Lungs: more clear, no wheezes heard today- better movement Abdomen: Soft, NT, ND, (+)BS     Extremities: No clubbing, cyanosis, or edema L AKA Skin:  L AKA is vertical incision- was wearing shrinker with belt- has 2 small areas on backside of vertical incision that are draining clotting/type blood- looks like hematoma- no erythema- nothing to silver nitrate. - shrinker in place- C/D/I Neurologic: Ox3  Cranial nerves II through XII intact, motor strength is 5/5 in bilateral deltoid, bicep, tricep, grip, Right hip flexor, knee extensors, ankle dorsiflexor and plantar flexor, 3 - left hip flexor,  Sensory exam normal sensation to light touch  in bilateral upper and lower extremities Cerebellar exam normal finger to nose to finger as well as heel to shin in bilateral upper and lower extremities Musculoskeletal: L AKA- as above      Assessment/Plan: 1. Functional  deficits which require 3+ hours per day of interdisciplinary therapy in a comprehensive inpatient rehab setting.  Physiatrist is providing close team supervision and 24 hour management of active medical problems listed below.  Physiatrist and rehab team continue to assess barriers to discharge/monitor patient progress toward functional and medical goals  Care Tool:  Bathing    Body parts bathed by patient: Right arm,Left arm,Chest,Abdomen,Front perineal area,Right upper leg,Buttocks,Left upper leg,Right lower leg,Face   Body parts bathed by helper: Right lower leg Body parts n/a: Left lower leg   Bathing assist Assist Level: Minimal Assistance - Patient > 75% (R foot for thoroughness, almost CGA)     Upper Body Dressing/Undressing Upper body dressing   What is the patient wearing?: Pull over shirt    Upper body assist Assist Level: Set up assist    Lower Body Dressing/Undressing Lower body dressing      What is the patient wearing?: Pants     Lower body assist Assist for lower body dressing: Contact Guard/Touching assist     Toileting Toileting    Toileting assist Assist for toileting: Minimal Assistance - Patient > 75%     Transfers Chair/bed transfer  Transfers assist     Chair/bed transfer assist level: Minimal Assistance - Patient > 75% Chair/bed transfer assistive device:   Ambulation assist      Assist level: Minimal Assistance - Patient > 75% Assistive device: Walker-rolling Max distance: 15   Walk 10 feet  activity   Assist     Assist level: Minimal Assistance - Patient > 75% Assistive device: Walker-rolling   Walk 50 feet activity   Assist Walk 50 feet with 2 turns activity did not occur: Safety/medical concerns         Walk 150 feet activity   Assist Walk 150 feet activity did not occur: Safety/medical concerns         Walk 10 feet on uneven surface  activity   Assist Walk 10 feet on  uneven surfaces activity did not occur: Safety/medical concerns         Wheelchair     Assist Will patient use wheelchair at discharge?: Yes Type of Wheelchair: Manual    Wheelchair assist level: Supervision/Verbal cueing Max wheelchair distance: 150'    Wheelchair 50 feet with 2 turns activity    Assist        Assist Level: Supervision/Verbal cueing   Wheelchair 150 feet activity     Assist      Assist Level: Supervision/Verbal cueing   Blood pressure (!) 169/87, pulse (!) 59, temperature 97.6 F (36.4 C), temperature source Oral, resp. rate 18, height 5\' 11"  (1.803 m), weight 113.2 kg, SpO2 90 %.  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  -patient may  shower -ELOS/Goals: 10-12d Sup/ModI goals 2. Antithrombotics: -DVT/anticoagulation:Chronic Eliquis has been resumed. -antiplatelet therapy:  3. Pain Management:Voltaren gel 4 times daily, Neurontin 400 mg 3 times daily, OxyContin CR 20 mg every 12 hours, oxycodone as needed, Robaxin 1000 mg every 6 hours as needed  1/25- on gabapentin - for phantom pain- will add Cymbalta 30 mg QHS for 4 days,then 60 mg QHS. Con't Oxycontin and Oxycodone.  1/26- will change Oxycodone to q4 hours prn 10 mg- and add Tramadol 50 mg q6 hours prn.   1/27- pt reports more manageable, but will encourage him to ask for meds. However not asked for tramadol YET_ will schedule tramadol and con't oxy prn q4 hours prn    1/28- will increase oxycontin to 30 mg BID for the moment- hopefully can go back to 20 mg BID by d/c? Might not- con't oxycodone and oxycontin and tramadol scheduled.  4. Mood:Provide emotional support -antipsychotic agents: N/A 5. Neuropsych: This patientiscapable of making decisions on hisown behalf. 6. Skin/Wound Care:Routine skin checks  1/24- eucerin BID  added  1/27- will change dressing to BID and prn if saturated  1/28- recheck has eucerin BID 7. Fluids/Electrolytes/Nutrition:Routine in and outs with follow-up chemistries 8. Severe normocytic anemia. Follow-up CBC. Follow-up hematology services  1/25- Hb 7.5- stable so far.   1/27- will recheck labs in AM, esp since draining from L AKA incision  1/28- just came to get labs- awaiting/pending labs 9. Atrial fibrillation. Cardizem 240 mg daily, continue Eliquis. 10. 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 Latest creatinine 1.45, total CK 202. Follow-up renal services  1/27- will recheck in AM  1/28- labs pending 11. Hypertension. Aldactone 25 mg dailyNormodyne 400 mg twice daily. Monitor with increased mobility  1/24- BP 145/91- slightly elevated- but will monitor since on multiple meds.  1/26- BP 190/95- will give prn-  Hydralazine for very elevated Bps.  Will add 25 mg q6 hours for SBP >180 or DBP >110.   1/27- BP 160s/81 this AM-will add  hydralazine scheduled for BP 10 mg TID  1/28- BP 169/87- added Hydralazine 10 mg TID yesterday- might need to increase over weekend.  12. Aortic dissection with repair. Follow-up outpatient 13. Obesity. Dietary follow-up 14. Bronchospasms/wheezing  1/26- will add Albuterol nebs q4 hours prn- pt needs to get this AM.   1/27- will encourage nursing to do/ask for if pt has Sx's.   LOS: 4 days A FACE TO FACE EVALUATION WAS PERFORMED  Nawal Burling 09/23/2020, 8:18 AM

## 2020-09-23 NOTE — Progress Notes (Signed)
Occupational Therapy Session Note  Patient Details  Name: Tywon Niday MRN: [id number] Date of Birth: 29-May-1956  Today's Date: 09/23/2020 OT Individual Time: 1000-1054 OT Individual Time Calculation (min): 54 min   Short Term Goals: Week 1:  OT Short Term Goal 1 (Week 1): Pt will perform BSC/toilet transfers with CGA OT Short Term Goal 2 (Week 1): Pt will perform LB bathe/dress with CGA using AE PRN OT Short Term Goal 3 (Week 1): Pt will perform ADL/activity for 15+ minutes without rest break OT Short Term Goal 4 (Week 1): Pt will perform clothing management standing with CGA      Skilled Therapeutic Interventions/Progress Updates:    Pt greeted via PT handoff, reporting 9/10 pain in both LEs Lt>Rt. RN in during session, stated pt premedicated with oxy. While pt washed his limb sock in the sink with vcs, we discussed massage/tapping techniques for pain mgt for residual limb. 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 OT provided him with writing implements and 2 notepads, discussed using therapeutic activity as a means for holistic pain mgt. Pt verbalized understanding. Also discussed limb sock care using handwashing with gentle soap post d/c, pt needing cues to turn off water today to prevent sink overflow. After OT laid out limb sock flat to dry on the windowsill, setup for oral care completion. Then continued working on activity tolerance via participation in bedmaking tasks. Pt able to don cases onto 2 pillows before completing squat pivot<bed with CGA and vcs. Pt returned to bed and was left with all needs within reach and bed alarm set.   Therapy Documentation Precautions:  Precautions Precautions: Fall Precaution Comments: L AKA Restrictions Weight Bearing Restrictions: No LLE Weight Bearing: Non weight bearing ADL: ADL Eating: Not assessed Grooming: Not assessed Upper Body  Bathing: Supervision/safety Where Assessed-Upper Body Bathing: Edge of bed Lower Body Bathing: Minimal assistance Where Assessed-Lower Body Bathing: Edge of bed Upper Body Dressing: Supervision/safety Where Assessed-Upper Body Dressing: Edge of bed Lower Body Dressing: Not assessed (not assessed d/t time constraints) Toileting: Minimal assistance Where Assessed-Toileting: Bedside Commode Toilet Transfer: Minimal assistance Toilet Transfer Method: Stand pivot Toilet Transfer Equipment: Bedside commode      Therapy/Group: Individual Therapy  Briah Nary A Ramey Schiff 09/23/2020, 12:15 PM

## 2020-09-24 NOTE — Progress Notes (Signed)
Connor [name] PHYSICAL MEDICINE & REHABILITATION PROGRESS NOTE   Subjective/Complaints: Pt has both phantom pain and stump pain Discussed that his LA med was increased yesteray but pt not requesting Oxy IR (1 dose in 36hr)  Higher dose cymbalta to start today  ROS:   Pt denies SOB, abd pain, CP, N/V/C/D, and vision changes  Objective:   No results found. Recent Labs    09/23/20 0809  WBC 7.4  HGB 7.9*  HCT 26.2*  PLT 227   Recent Labs    09/23/20 0809  NA 137  K 3.6  CL 102  CO2 25  GLUCOSE 111*  BUN 15  CREATININE 1.28*  CALCIUM 8.5*    Intake/Output Summary (Last 24 hours) at 09/24/2020 0944 Last data filed at 09/24/2020 0721 Gross per 24 hour  Intake 240 ml  Output 700 ml  Net -460 ml        Physical Exam: Vital Signs Blood pressure (!) 162/91, pulse (!) 58, temperature 97.6 F (36.4 C), resp. rate 18, height 5\' 11"  (1.803 m), weight 113.2 kg, SpO2 98 %.  Physical Exam   General: No acute distress Mood and affect are appropriate Heart: Regular rate and rhythm no rubs murmurs or extra sounds Lungs: Clear to auscultation, breathing unlabored, no rales or wheezes Abdomen: Positive bowel sounds, soft nontender to palpation, nondistended Extremities: No clubbing, cyanosis, or edema Skin: No evidence of breakdown, no evidence of rash   L AKA Skin:  L AKA is vertical incision- was wearing shrinker with belt- has 2 small areas on backside of vertical incision that are draining clotting/type blood- looks like hematoma- no erythema- nothing to silver nitrate. - shrinker in place- C/D/I Neurologic: Ox3  Cranial nerves II through XII intact, motor strength is 5/5 in bilateral deltoid, bicep, tricep, grip, Right hip flexor, knee extensors, ankle dorsiflexor and plantar flexor, 3 - left hip flexor,  Sensory exam normal sensation to light touch  in bilateral upper and lower extremities Cerebellar exam normal finger to nose to finger as well as heel to shin in  bilateral upper and lower extremities Musculoskeletal: L AKA- as above      Assessment/Plan: 1. Functional deficits which require 3+ hours per day of interdisciplinary therapy in a comprehensive inpatient rehab setting.  Physiatrist is providing close team supervision and 24 hour management of active medical problems listed below.  Physiatrist and rehab team continue to assess barriers to discharge/monitor patient progress toward functional and medical goals  Care Tool:  Bathing    Body parts bathed by patient: Right arm,Left arm,Chest,Abdomen,Front perineal area,Right upper leg,Buttocks,Left upper leg,Right lower leg,Face   Body parts bathed by helper: Right lower leg Body parts n/a: Left lower leg   Bathing assist Assist Level: Minimal Assistance - Patient > 75% (R foot for thoroughness, almost CGA)     Upper Body Dressing/Undressing Upper body dressing   What is the patient wearing?: Pull over shirt    Upper body assist Assist Level: Set up assist    Lower Body Dressing/Undressing Lower body dressing      What is the patient wearing?: Pants     Lower body assist Assist for lower body dressing: Contact Guard/Touching assist     Toileting Toileting    Toileting assist Assist for toileting: Minimal Assistance - Patient > 75%     Transfers Chair/bed transfer  Transfers assist     Chair/bed transfer assist level: Contact Guard/Touching assist Chair/bed transfer assistive device:  Ambulation assist      Assist level: Minimal Assistance - Patient > 75% Assistive device: Walker-rolling Max distance: 15   Walk 10 feet activity   Assist     Assist level: Minimal Assistance - Patient > 75% Assistive device: Walker-rolling   Walk 50 feet activity   Assist Walk 50 feet with 2 turns activity did not occur: Safety/medical concerns         Walk 150 feet activity   Assist Walk 150 feet activity did not occur:  Safety/medical concerns         Walk 10 feet on uneven surface  activity   Assist Walk 10 feet on uneven surfaces activity did not occur: Safety/medical concerns         Wheelchair     Assist Will patient use wheelchair at discharge?: Yes Type of Wheelchair: Manual    Wheelchair assist level: Supervision/Verbal cueing Max wheelchair distance: 150'    Wheelchair 50 feet with 2 turns activity    Assist        Assist Level: Supervision/Verbal cueing   Wheelchair 150 feet activity     Assist      Assist Level: Supervision/Verbal cueing   Blood pressure (!) 162/91, pulse (!) 58, temperature 97.6 F (36.4 C), resp. rate 18, height 5\' 11"  (1.803 m), weight 113.2 kg, SpO2 98 %.  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  -patient may  shower -ELOS/Goals: 10-12d Sup/ModI goals 2. Antithrombotics: -DVT/anticoagulation:Chronic Eliquis has been resumed. -antiplatelet therapy:  3. Pain Management:Voltaren gel 4 times daily, Neurontin 400 mg 3 times daily, OxyContin CR 20 mg every 12 hours, oxycodone as needed, Robaxin 1000 mg every 6 hours as needed  1/25- on gabapentin - for phantom pain- will add Cymbalta 30 mg QHS for 4 days,then 60 mg QHS. Con't Oxycontin and Oxycodone.  1/26- will change Oxycodone to q4 hours prn 10 mg- and add Tramadol 50 mg q6 hours prn.   1/27- pt reports more manageable, but will encourage him to ask for meds. However not asked for tramadol YET_ will schedule tramadol and con't oxy prn q4 hours prn    1/28- will increase oxycontin to 30 mg BID for the moment- hopefully can go back to 20 mg BID by d/c? Might not- con't oxycodone and oxycontin and tramadol scheduled.  Did not take oxy IR on 1/28, so far 1 oxy IR today  4. Mood:Provide emotional support -antipsychotic agents: N/A 5.  Neuropsych: This patientiscapable of making decisions on hisown behalf. 6. Skin/Wound Care:Routine skin checks  1/24- eucerin BID added  1/27- will change dressing to BID and prn if saturated  1/28- recheck has eucerin BID 7. Fluids/Electrolytes/Nutrition:Routine in and outs with follow-up chemistries 8. Severe normocytic anemia. Follow-up CBC. Follow-up hematology services  1/25- Hb 7.5- stable so far.   1/27- will recheck labs in AM, esp since draining from L AKA incision  1/28- just came to get labs- awaiting/pending labs 9. Atrial fibrillation. Cardizem 240 mg daily, continue Eliquis. 10. 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 Latest creatinine 1.45, total CK 202. Follow-up renal services  1/27- will recheck in AM  1/28- labs pending 11. Hypertension. Aldactone 25 mg dailyNormodyne 400 mg twice daily. Monitor with increased mobility  1/24- BP 145/91- slightly elevated- but will monitor since on multiple meds.  1/26- BP 190/95- will give prn-  Hydralazine for very elevated Bps.  Will add 25 mg q6 hours for  SBP >180 or DBP >110.   1/27- BP 160s/81 this AM-will add  hydralazine scheduled for BP 10 mg TID  1/28- BP 169/87- added Hydralazine 10 mg TID yesterday- might need to increase over weekend.  12. Aortic dissection with repair. Follow-up outpatient 13. Obesity. Dietary follow-up 14. Bronchospasms/wheezing  1/26- will add Albuterol nebs q4 hours prn- pt needs to get this AM.   1/27- will encourage nursing to do/ask for if pt has Sx's.   LOS: 5 days A FACE TO FACE EVALUATION WAS PERFORMED  Erick Colace 09/24/2020, 9:44 AM

## 2020-09-25 MED ORDER — HYDRALAZINE HCL 10 MG PO TABS
10.0000 mg | ORAL_TABLET | Freq: Four times a day (QID) | ORAL | Status: DC
  Administered 2020-09-25 – 2020-09-27 (×8): 10 mg via ORAL
  Filled 2020-09-25 (×8): qty 1

## 2020-09-25 NOTE — Progress Notes (Signed)
[place] PHYSICAL MEDICINE & REHABILITATION PROGRESS NOTE   Subjective/Complaints: Pt has both phantom pain and stump pain Discussed that his LA med was increased 1/28, took oxy IR x 2 doses yesterday  Higher dose cymbalta  Started yesterday PM - slept better  Large amt BM yesterday evening  ROS:   Pt denies SOB, abd pain, CP, N/V/D,  Objective:   No results found. Recent Labs    09/23/20 0809  WBC 7.4  HGB 7.9*  HCT 26.2*  PLT 227   Recent Labs    09/23/20 0809  NA 137  K 3.6  CL 102  CO2 25  GLUCOSE 111*  BUN 15  CREATININE 1.28*  CALCIUM 8.5*    Intake/Output Summary (Last 24 hours) at 09/25/2020 1004 Last data filed at 09/25/2020 0830 Gross per 24 hour  Intake 120 ml  Output 900 ml  Net -780 ml        Physical Exam: Vital Signs Blood pressure (!) 168/82, pulse (!) 53, temperature 98.2 F (36.8 C), temperature source Oral, resp. rate 20, height 5\' 11"  (1.803 m), weight 113.2 kg, SpO2 93 %.  Physical Exam   General: No acute distress Mood and affect are appropriate Heart: Regular rate and rhythm no rubs murmurs or extra sounds Lungs: Clear to auscultation, breathing unlabored, no rales or wheezes Abdomen: Positive bowel sounds, soft nontender to palpation, nondistended Extremities: No clubbing, cyanosis, or edema Skin: No evidence of breakdown, no evidence of rash   L AKA Skin:  L AKA is vertical incision- was wearing shrinker with belt- has 2 small areas on backside of vertical incision that are draining clotting/type blood- looks like hematoma- no erythema- nothing to silver nitrate. - shrinker in place- C/D/I Neurologic: Ox3  Cranial nerves II through XII intact, motor strength is 5/5 in bilateral deltoid, bicep, tricep, grip, Right hip flexor, knee extensors, ankle dorsiflexor and plantar flexor, 3 - left hip flexor,  Sensory exam normal sensation to light touch  in bilateral upper and lower extremities Cerebellar exam normal finger to nose  to finger as well as heel to shin in bilateral upper and lower extremities Musculoskeletal: L AKA- as above      Assessment/Plan: 1. Functional deficits which require 3+ hours per day of interdisciplinary therapy in a comprehensive inpatient rehab setting.  Physiatrist is providing close team supervision and 24 hour management of active medical problems listed below.  Physiatrist and rehab team continue to assess barriers to discharge/monitor patient progress toward functional and medical goals  Care Tool:  Bathing    Body parts bathed by patient: Right arm,Left arm,Chest,Abdomen,Front perineal area,Right upper leg,Buttocks,Left upper leg,Right lower leg,Face   Body parts bathed by helper: Right lower leg Body parts n/a: Left lower leg   Bathing assist Assist Level: Minimal Assistance - Patient > 75% (R foot for thoroughness, almost CGA)     Upper Body Dressing/Undressing Upper body dressing   What is the patient wearing?: Pull over shirt    Upper body assist Assist Level: Set up assist    Lower Body Dressing/Undressing Lower body dressing      What is the patient wearing?: Pants     Lower body assist Assist for lower body dressing: Contact Guard/Touching assist     Toileting Toileting    Toileting assist Assist for toileting: Minimal Assistance - Patient > 75%     Transfers Chair/bed transfer  Transfers assist     Chair/bed transfer assist level: Contact Guard/Touching assist Chair/bed transfer assistive device:  Walker   Locomotion Ambulation   Ambulation assist      Assist level: Minimal Assistance - Patient > 75% Assistive device: Walker-rolling Max distance: 15   Walk 10 feet activity   Assist     Assist level: Minimal Assistance - Patient > 75% Assistive device: Walker-rolling   Walk 50 feet activity   Assist Walk 50 feet with 2 turns activity did not occur: Safety/medical concerns         Walk 150 feet activity   Assist  Walk 150 feet activity did not occur: Safety/medical concerns         Walk 10 feet on uneven surface  activity   Assist Walk 10 feet on uneven surfaces activity did not occur: Safety/medical concerns         Wheelchair     Assist Will patient use wheelchair at discharge?: Yes Type of Wheelchair: Manual    Wheelchair assist level: Supervision/Verbal cueing Max wheelchair distance: 150'    Wheelchair 50 feet with 2 turns activity    Assist        Assist Level: Supervision/Verbal cueing   Wheelchair 150 feet activity     Assist      Assist Level: Supervision/Verbal cueing   Blood pressure (!) 168/82, pulse (!) 53, temperature 98.2 F (36.8 C), temperature source Oral, resp. rate 20, height 5\' 11"  (1.803 m), weight 113.2 kg, SpO2 93 %.  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  -patient may  shower -ELOS/Goals: 10-12d Sup/ModI goals 2. Antithrombotics: -DVT/anticoagulation:Chronic Eliquis has been resumed. -antiplatelet therapy:  3. Pain Management:Voltaren gel 4 times daily, Neurontin 400 mg 3 times daily, OxyContin CR 20 mg every 12 hours, oxycodone as needed, Robaxin 1000 mg every 6 hours as needed  1/25- on gabapentin - for phantom pain- will add Cymbalta 30 mg QHS for 4 days,then 60 mg QHS. Con't Oxycontin and Oxycodone.  1/26- will change Oxycodone to q4 hours prn 10 mg- and add Tramadol 50 mg q6 hours prn.   1/27- pt reports more manageable, but will encourage him to ask for meds. However not asked for tramadol YET_ will schedule tramadol and con't oxy prn q4 hours prn    1/28- will increase oxycontin to 30 mg BID for the moment- hopefully can go back to 20 mg BID by d/c? Might not- con't oxycodone and oxycontin and tramadol scheduled.  Started cymbalta 60mg  qhs on 1/29- may take several days/wks to achieve  full effect  In addition pt took Oxy IR x 2 on 1/29 4. Mood:Provide emotional support -antipsychotic agents: N/A 5. Neuropsych: This patientiscapable of making decisions on hisown behalf. 6. Skin/Wound Care:Routine skin checks  1/24- eucerin BID added  1/27- will change dressing to BID and prn if saturated  1/28- recheck has eucerin BID 7. Fluids/Electrolytes/Nutrition:Routine in and outs with follow-up chemistries 8. Severe normocytic anemia. Follow-up CBC. Follow-up hematology services  1/25- Hb 7.5- stable so far.   1/27- will recheck labs in AM, esp since draining from L AKA incision  1/28- just came to get labs- awaiting/pending labs 9. Atrial fibrillation. Cardizem 240 mg daily, continue Eliquis. 10. 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 Latest creatinine 1.45, total CK 202. Follow-up renal services  1/27- will recheck in AM  1/28- labs pending 11. Hypertension. Aldactone 25 mg dailyNormodyne 400 mg twice daily. Monitor with increased mobility  1/24- BP 145/91- slightly elevated- but will monitor since on multiple meds.  1/26- BP 190/95- will give prn-  Hydralazine for very elevated Bps.  Will add 25 mg q6 hours for SBP >180 or DBP >110.   1/27- BP 160s/81 this AM-will add  hydralazine scheduled for BP 10 mg TID  1/28- BP 169/87- added Hydralazine 10 mg TID yesterday- might need to increase over weekend.  Vitals:   09/24/20 1956 09/25/20 0435  BP: (!) 163/69 (!) 168/82  Pulse: 74 (!) 53  Resp: 20 20  Temp: 98.7 F (37.1 C) 98.2 F (36.8 C)  SpO2: 91% 93%  still elevated will increase hydralazine to QID- has not used prn 25mg  dose 12. Aortic dissection with repair. Follow-up outpatient 13. Obesity. Dietary follow-up 14. Bronchospasms/wheezing  1/26- will add Albuterol nebs q4 hours prn- pt needs to get this AM.   1/27- will encourage nursing to do/ask for if pt has Sx's.   LOS: 6 days A FACE TO FACE EVALUATION WAS PERFORMED  2/27 09/25/2020, 10:04 AM

## 2020-09-25 NOTE — Progress Notes (Signed)
Occupational Therapy Session Note  Patient Details  Name: Connor [name] MRN: [id number] Date of Birth: 07/09/1956  Today's Date: 09/25/2020 OT Individual Time: 3419-3790 and 1410-1507 OT Individual Time Calculation (min): 82 min and 57 min  Short Term Goals: Week 1:  OT Short Term Goal 1 (Week 1): Pt will perform BSC/toilet transfers with CGA OT Short Term Goal 2 (Week 1): Pt will perform LB bathe/dress with CGA using AE PRN OT Short Term Goal 3 (Week 1): Pt will perform ADL/activity for 15+ minutes without rest break OT Short Term Goal 4 (Week 1): Pt will perform clothing management standing with CGA    Skilled Therapeutic Interventions/Progress Updates:    Pt greeted while sitting on the [facility], had just transferred with NT. Pt unable to have a continent BM but OT taught him how to assist with perihygiene using lateral lean techniques and lowering the drop arm BSC at appropriate times. He did not want to wear LB clothing or transfer to the w/c to bathe at the sink. With encouragement, he was willing to bathe while sitting EOB so that he could return to bed after. CGA for stand pivot<bed using RW. Pt required setup for UB bathing and Min A for donning hospital gown. Pt spending a great deal of time washing his Rt foot while propping it up on the mattress, noted brown residue on his heel with pt needing >5 wash cloths to clean it well. RN in to assess who stated it was a corn. Pt reports pain coming from this point in the heel and also in the arch. RN applied foam dressing and then completed dressing changes to residual limb. OT set pt up with an inspection mirror so that he could see incision site. OT assisted with ACE wrapping limb and donning shrinker sock including trunk strap. Setup for oral care. Note that pt is very slow motoricaly and requires increased time to execute motor demands of stated tasks. Pt returned to bed and remained in bed with all needs within reach and bed alarm set.    2nd  Session 1:1 tx (57 min) Pt greeted in bed and premedicated for pain, required encouragement to leave the room for tx. Started by donning pants while EOB, pt utilizing lateral leans to elevate pants 50% of the way, needing to stand using RW with CGA to finish. CGA for stand pivot<w/c. Escorted pt to a community environment to work on [employer] and endurance. Pt maneuvering w/c around obstacles and through tight spaces, ordered a snack at [facility] w/c level. He refused to try transferring to any of the furniture in the food court. When he self propelled back to the unit, OT showed him the tub shower room and TTB setup. He will need a TTB for home. Note that his bathroom is only walker accessible. Pt refused to practice shower transfer during session due to feeling sleepy, self propelled back to the room and completed a squat pivot<bed with close supervision. Pt remained EOB with all needs within reach and bed alarm set.   Therapy Documentation Precautions:  Precautions Precautions: Fall Precaution Comments: L AKA Restrictions Weight Bearing Restrictions: No LLE Weight Bearing: Weight bearing as tolerated Vital Signs: Therapy Vitals BP: 133/67 ADL: ADL Eating: Not assessed Grooming: Not assessed Upper Body Bathing: Supervision/safety Where Assessed-Upper Body Bathing: Edge of bed Lower Body Bathing: Minimal assistance Where Assessed-Lower Body Bathing: Edge of bed Upper Body Dressing: Supervision/safety Where Assessed-Upper Body Dressing: Edge of bed Lower Body Dressing: Not assessed (not  assessed d/t time constraints) Toileting: Minimal assistance Where Assessed-Toileting: Bedside Commode Toilet Transfer: Minimal assistance Toilet Transfer Method: Stand pivot Toilet Transfer Equipment: Bedside commode      Therapy/Group: Individual Therapy  Neiva Maenza A Ziyad Dyar 09/25/2020, 12:38 PM

## 2020-09-25 NOTE — Progress Notes (Signed)
Physical Therapy Session Note  Patient Details  Name: Connor [name] MRN: [id number] Date of Birth: 10-Oct-1955  Today's Date: 09/25/2020 PT Individual Time: 7035-0093 PT Individual Time Calculation (min): 72 min   Short Term Goals: Week 1:  PT Short Term Goal 1 (Week 1): Patient will complete all bed mobility with no more CGA PT Short Term Goal 2 (Week 1): Patient will complete bed<> wc transfer with no more than CGA consistently and LRAD PT Short Term Goal 3 (Week 1): Patient will tolerate standing >42mins with LRAD and no more than CGA PT Short Term Goal 4 (Week 1): Patient will be able to teachback s/s of poor wound healing/infection in residual limb  Skilled Therapeutic Interventions/Progress Updates:   Pt received supine in bed and agreeable to therapy session. Pt reports recent medication administration stating "I think I've had too much medicine" stating he "had a cup full." Pt appears drowsy during session with slurred speech. Discussed home entry with pt reporting there are 2 STE the back that are "spread out/large" enough for the RW to fit on and then 4 "choppy" steps in front with only R HR with pt stating someone is reinforcing that HR this weekend (weekend of 1/29). Supine>sitting L EOB, HOB partially elevated and using bedrail, with supervision. Pt states he is concerned about how much his amputee incision is draining and is worried about managing this at home - encouraged pt to discuss this with MD. Discussed learning how to perform squat pivot transfers to surfaces level with w/c as an alternative to stand pivots to increase pt independence and to not have to keep track of RW. Discussed w/c navigation vs ambulation in the home and pt reports concern regarding w/c fitting in the house - discussed pt being in 22in width w/c and trying 20in to allow easier fit in the home as pt has extra space in the 22in. Therapist provided pt with 20x18in w/c during session. R squat pivot EOB>w/c with  CGA for safety. BUE w/c propulsion ~155ft to main therapy gym with supervision. L squat pivot w/c>EOM with CGA for safety. R stand pivot EOB>w/c using RW with CGA/min assist for steadying/balance - educated pt on safe UE placement when using AD due to L LE amputation. Gait training ~37ft with RW CGA primairly until end requiring min assist due to increased pain in R foot arch - pt planning to ask cousin to bring in another pair of shoes - demos adequate R LE foot clearance during hop-to technique. After ambulating pt appears increasingly drowsy stating feels like he was about to go to sleep immediately upon sitting down with labored breathing - SpO2 96% and HR 59bpm. Sit>supine on mat with supervision. Performed the following L LE exercises:  - supine straight leg raise x20reps - supine hip abduction x20 reps - unable to tolerate prone but for 3 reps of hip extension reporting strong ache in hip and therefore rolled into R sidelying - L LE hip abduction x15 reps - sidelying L LE hip extension x15 reps Returned to sitting EOB with supervision. Squat pivot EOM>w/c with close supervision. B UE w/c propulsion ~13ft back to room with supervision. Pt reports need to use bathroom - NT notified and present to assume care of pt.  Therapy Documentation Precautions:  Precautions Precautions: Fall Precaution Comments: L AKA Restrictions Weight Bearing Restrictions: No LLE Weight Bearing: Weight bearing as tolerated  Pain: Reports L LE pain 9/10 - premedicated - of note pt very drowsy during session and  no noticeable grimacing when moving L LE only hip "ache" upon attempts at lying prone. States R foot is "not as bad as it was" when resting but during ambulation reports increase pain in the arch - discussed having his cousin bring in more shoes - limited standing for pain management.  Therapy/Group: Individual Therapy  Ginny Forth , PT, DPT, CSRS  09/25/2020, 8:08 AM

## 2020-09-26 ENCOUNTER — Telehealth: Payer: Self-pay

## 2020-09-26 DIAGNOSIS — R079 Chest pain, unspecified: Secondary | ICD-10-CM

## 2020-09-26 LAB — TROPONIN I (HIGH SENSITIVITY): Troponin I (High Sensitivity): 13 ng/L (ref <18)

## 2020-09-26 NOTE — Progress Notes (Addendum)
1859: Cardiology being consulted by Connor [name], [place]   Patient complaining of chest pain "squeezing" sensation in chest. Dan, PA called and EKG obtained. EKG performed and given to [place], [place]. No new orders at this time. Vital signs WNL

## 2020-09-26 NOTE — Progress Notes (Signed)
Patient with some complaints this morning of nonspecific chest discomfort squeezing.  Denies any shortness of breath.  EKG sinus bradycardia 55 bpm.  He does have a history of aortic dissection.  Patient currently maintained on chronic Eliquis.  Will consult his cardiologist Dr. Jerene Pitch for follow-up and any further recommendations.

## 2020-09-26 NOTE — Telephone Encounter (Signed)
Received a call from [facility]. States patient had SU 09/09/2020- Revision L AKA. Still has some drainage. They want for Dr Lajoyce Corners or [place] to go see patient/ SU site and Talk to patient. Suppose to be going home Friday. He is at [facility] [phone].   CB [phone]

## 2020-09-26 NOTE — Progress Notes (Signed)
Physical Therapy Session Note  Patient Details  Name: Connor [name] MRN: [id number] Date of Birth: 1956-01-31  Today's Date: 09/26/2020 PT Individual Time: 1108-1201 PT Individual Time Calculation (min): 53 min   Short Term Goals: Week 1:  PT Short Term Goal 1 (Week 1): Patient will complete all bed mobility with no more CGA PT Short Term Goal 2 (Week 1): Patient will complete bed<> wc transfer with no more than CGA consistently and LRAD PT Short Term Goal 3 (Week 1): Patient will tolerate standing >28mins with LRAD and no more than CGA PT Short Term Goal 4 (Week 1): Patient will be able to teachback s/s of poor wound healing/infection in residual limb  Skilled Therapeutic Interventions/Progress Updates:    Pt seated EOB on arrival, reporting 4/10 chest pain, 8/10 pain in residual limb, and requesting not to leave his room. Discussed chest pain with nursing, reports EKG is normal, ok to proceed with therapy session at this time. Pt seems drowsy during session but states he is not sleepy and is able to participate. Pt performs sit>supine CGA for safety. Performs bridges 2x10 before reporting dizziness which passed after sitting up. Seated BP=137/72. Pt stands from EOB with RW and min A with VCs for technique. Pt's shrinker fell off in standing, wound showing sanguinous drainage. Residual limb re wrapped with guaze and shrinker donned. Reports dizziness after performing standing  L 3 way hip, BP in sitting 134/76. Standing BP=119/78. Pt able to perform side hops 2x4 ft with min A. Pt returned to EOB and was left with all needs in reach and bed alarm active.  Therapy Documentation Precautions:  Precautions Precautions: Fall Precaution Comments: L AKA Restrictions Weight Bearing Restrictions: No LLE Weight Bearing: Weight bearing as tolerated    Therapy/Group: Individual Therapy  Hosteen Kienast,SPT 09/26/2020, 12:08 PM

## 2020-09-26 NOTE — Progress Notes (Signed)
Physical Therapy Session Note  Patient Details  Name: Connor [name] MRN: [id number] Date of Birth: [birth year]  Today's Date: 09/26/2020 PT Individual Time: 0901   PT attempted to see pt for skilled PT session however pt denied to participate 2/2 pain in L residual limb 8/10 and 9/10 chest pain at L lateral side of chest. Pt left as found, All needs in reach and in good condition. Call light in hand.  And nursing notified immediately.   General: PT Amount of Missed Time (min): 60 Minutes PT Missed Treatment Reason: Patient unwilling to participate;Pain   Barbaraann Faster 09/26/2020, 10:01 AM

## 2020-09-26 NOTE — Progress Notes (Signed)
Occupational Therapy Session Note  Patient Details  Name: Connor [name] MRN: [id number] Date of Birth: [birth year]  Today's Date: 09/26/2020 OT Individual Time: 1430-1503 OT Individual Time Calculation (min): 33 min   Short Term Goals: Week 1:  OT Short Term Goal 1 (Week 1): Pt will perform BSC/toilet transfers with CGA OT Short Term Goal 2 (Week 1): Pt will perform LB bathe/dress with CGA using AE PRN OT Short Term Goal 3 (Week 1): Pt will perform ADL/activity for 15+ minutes without rest break OT Short Term Goal 4 (Week 1): Pt will perform clothing management standing with CGA  Skilled Therapeutic Interventions/Progress Updates:    Pt greeted in bed, requesting for OT to return in 30 minutes so that he could rest a bit more. OT returned per pt request and he was sitting EOB. Setup for oral care completion. Transitioned to UB strengthening using 3# dumbbells for each hand, 15 reps 2 sets each exercise with guided instruction. Pt returned to bed at close of session, left with all needs within reach and bed alarm set.  30 minutes missed initially due to pt fatigue/refusal  Therapy Documentation Precautions:  Precautions Precautions: Fall Precaution Comments: L AKA Restrictions Weight Bearing Restrictions: No LLE Weight Bearing: Weight bearing as tolerated Vital Signs: Therapy Vitals Temp: 98 F (36.7 C) Temp Source: Oral Pulse Rate: (!) 52 Resp: 15 BP: 130/70 Patient Position (if appropriate): Lying Oxygen Therapy SpO2: (!) 89 % O2 Device: Room Air Pain: Pt reported being premedicated for LE pain   ADL: ADL Eating: Not assessed Grooming: Not assessed Upper Body Bathing: Supervision/safety Where Assessed-Upper Body Bathing: Edge of bed Lower Body Bathing: Minimal assistance Where Assessed-Lower Body Bathing: Edge of bed Upper Body Dressing: Supervision/safety Where Assessed-Upper Body Dressing: Edge of bed Lower Body Dressing: Not assessed (not assessed d/t time  constraints) Toileting: Minimal assistance Where Assessed-Toileting: Bedside Commode Toilet Transfer: Minimal assistance Toilet Transfer Method: Stand pivot Toilet Transfer Equipment: Bedside commode      Therapy/Group: Individual Therapy  Thereasa Iannello A Sophy Mesler 09/26/2020, 3:50 PM

## 2020-09-26 NOTE — Progress Notes (Addendum)
Progress Note  Patient Name: Connor [name] Date of Encounter: 09/26/2020  [facility] Cardiologist: Little Ishikawa, MD   Subjective   Planned to work with PT this morning but delayed due to reported chest pain.  Had episode of centralized chest pressure, felt like " pushing down or squeezing" sensation this morning after taking several pills. Says it lasted a couple of minutes and subsided.   No chest pain at present.   Inpatient Medications    Scheduled Meds: . apixaban  5 mg Oral BID  . diclofenac Sodium  2 g Topical QID  . diltiazem  240 mg Oral Daily  . DULoxetine  60 mg Oral QHS  . feeding supplement  237 mL Oral BID BM  . gabapentin  400 mg Oral TID  . hydrALAZINE  10 mg Oral Q6H  . hydrocerin   Topical BID  . labetalol  400 mg Oral BID  . oxyCODONE  30 mg Oral Q12H  . polyethylene glycol  17 g Oral Daily  . spironolactone  25 mg Oral Daily  . traMADol  50 mg Oral Q6H   Continuous Infusions:  PRN Meds: albuterol, hydrALAZINE, methocarbamol, ondansetron **OR** ondansetron (ZOFRAN) IV, oxyCODONE   Vital Signs    Vitals:   09/25/20 2020 09/26/20 0029 09/26/20 0507 09/26/20 0913  BP: 138/77 129/65 (!) 154/80 137/75  Pulse: (!) 57 (!) 54 62 (!) 53  Resp: 18  19 18   Temp: 98 F (36.7 C)  98.1 F (36.7 C) 98.4 F (36.9 C)  TempSrc: Oral   Oral  SpO2: 93%  95% 98%  Weight:      Height:        Intake/Output Summary (Last 24 hours) at 09/26/2020 1026 Last data filed at 09/26/2020 0900 Gross per 24 hour  Intake 180 ml  Output 425 ml  Net -245 ml   Last 3 Weights 09/19/2020 09/11/2020 09/09/2020  Weight (lbs) 249 lb 9 oz 251 lb 1.7 oz 241 lb 10 oz  Weight (kg) 113.2 kg 113.9 kg 109.6 kg      Telemetry    No telemetry  ECG    SB rate 55bpm, 1st AVB - Personally Reviewed  Physical Exam  Sitting up in the side of the bed.  GEN: No acute distress.   Neck: No JVD Cardiac: RRR, no murmurs, rubs, or gallops.  Respiratory: Clear to  auscultation bilaterally. GI: Soft, nontender, non-distended  MS: Left AKA  Neuro:  Nonfocal  Psych: Normal affect   Labs    High Sensitivity Troponin:   Recent Labs  Lab 08/30/20 1926 08/30/20 2215 09/06/20 2147 09/07/20 0019  TROPONINIHS 425* 482* 32* 33*      Chemistry Recent Labs  Lab 09/20/20 0546 09/23/20 0809  NA 139 137  K 4.5 3.6  CL 104 102  CO2 24 25  GLUCOSE 90 111*  BUN 16 15  CREATININE 1.41* 1.28*  CALCIUM 8.5* 8.5*  PROT 6.7  --   ALBUMIN 2.2*  --   AST 17  --   ALT 18  --   ALKPHOS 106  --   BILITOT 1.0  --   GFRNONAA 56* >60  ANIONGAP 11 10     Hematology Recent Labs  Lab 09/20/20 0546 09/23/20 0809  WBC 7.2 7.4  RBC 2.48* 2.71*  HGB 7.5* 7.9*  HCT 23.8* 26.2*  MCV 96.0 96.7  MCH 30.2 29.2  MCHC 31.5 30.2  RDW 16.6* 16.7*  PLT 239 227    BNPNo results  for input(s): BNP, PROBNP in the last 168 hours.   DDimer No results for input(s): DDIMER in the last 168 hours.   Radiology    No results found.  Cardiac Studies   Echo: 09/05/20  IMPRESSIONS    1. Left ventricular ejection fraction, by estimation, is 60 to 65%. The  left ventricle has normal function.  2. Right ventricular systolic function is normal. The right ventricular  size is normal. Pulmonary artery systolic pressure is 15mmHg+ RAP.  3. Left atrial size was severely dilated. No left atrial/left atrial  appendage thrombus was detected.  4. The mitral valve is normal in structure. Trivial mitral valve  regurgitation.  5. The aortic valve is tricuspid. Aortic valve regurgitation is trivial.  No aortic stenosis is present.  6. Agitated saline contrast bubble study was positive with shunting  observed after >6 cardiac cycles suggestive of intrapulmonary shunting.  7. Right atrial size was severely dilated.  8. No valvular vegetations visualized.   Patient Profile     65 y.o. male with PMH of aortic dissection s/p repair in 2016, developed dissection at  anastomosis suture lines 2019 which was c/b sternal infection and cardiogenic shock with EF down to 15%, persistent atrial fibrillation, HTN who was recently admitted during 08/30/20-09/19/20 for bacteremia and found to have necrotizing fascitis of left leg. 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 Developed AKI on CKD 2/2 acute tubular injury.  Required transfusions for post op anemia. Was able to resume on chronic anticoagulation. Discharged to CIR from [facility] service.   Assessment & Plan    1. Chest pain: says developed after he took several pills this morning, felt like a "pressing down, pressure like sensation" that resolved on it's own. Was quite painful when it happened but no pain since that time. EKG showed SB with no acute ST/TW changes. hsTn pending. Seems more GI related based on patient hx. Would not plan for further work up if negative enzymes  2. Hx of AAA s/p repair '16 and '19 c/b sternal infection and cardiogenic shock: Blood pressures were soft during prior admission. Now stable with Diltiazem 240mg  daily, labetalol 400mg  BID, hydralazine 10mg  QID and spiro 25mg  daily  3. Necrotizing fasciitis s/p left AKA: done on 1/12. Now in CIR for rehab working with PT  4. Persistent Afib: remains in SR. -- Stroke prevention with Eliquis 5mg  BID  5. Anemia: c/b post op anemia. Received 7 units of PRBCs during recent admission. Hgb hovers around 7.0-8.0 range. Last check 1/28 7.9.   For questions or updates, please contact [facility] Please consult [email or web address] for contact info under     Signed, , NP  09/26/2020, 10:26 AM    Patient seen, examined. Available data reviewed. Agree with findings, assessment, and plan as outlined by 3/12, NP.  The patient is independently interviewed and examined.  He is alert, oriented, in no distress.  JVP is normal, lungs are clear, heart is regular rate and rhythm with a 2/6 holosystolic  murmur best heard at the left lower sternal border, abdomen is soft and nontender, extremities have no edema, skin is warm and dry.  Left above-knee amputation noted.  EKG is normal sinus rhythm and shows no significant ST/T wave abnormalities.  High-sensitivity troponin is normal at 13.  The patient's primary episode of chest discomfort occurred when he took several pills all at once.  I suspect his pain may have been GI in etiology.  He  did have a recurrence of discomfort in the chest at the end of his physical therapy session.  Fortunately, objective markers are normal with a normal high-sensitivity troponin and normal EKG.  Will follow up tomorrow to make sure he remains stable, but I do not anticipate any further cardiac evaluation at this time.  Tonny Bollman, M.D. 09/26/2020 1:10 PM

## 2020-09-26 NOTE — Progress Notes (Signed)
Spoke with pharmacy about antibacterial soap if hospital supplied and if [place] could be used. It was suggested to use regular soap supplied and wipe with chlorhexidine wipes because it antibacterial. Will continue to monitor. Ilean Skill LPN

## 2020-09-26 NOTE — Progress Notes (Signed)
[place] PHYSICAL MEDICINE & REHABILITATION PROGRESS NOTE   Subjective/Complaints:  Pt reports pain is more manageable since went up on Oxycontin-  Still worried about continued/worse drainage overnight of L AKA- from posterior aspect of incision.   LBM yesterday- very large- not constipated/hard stools.   Also, after I saw him this AM- pt had "squeezing" chest pain this AM after taking a few pills at once- Cards saw him- due to his hx of aortic dissection- was felt to be GI related- Troponin is 13- down from 33 2 weeks ago- and ~ 500 when having cardiac issues.    Did also note this Am, sweats when he sleeps  And just woke up.    ROS:  Pt denies SOB, abd pain, CP, N/V/C/D, and vision changes  Objective:   No results found. No results for input(s): WBC, HGB, HCT, PLT in the last 72 hours. No results for input(s): NA, K, CL, CO2, GLUCOSE, BUN, CREATININE, CALCIUM in the last 72 hours.  Intake/Output Summary (Last 24 hours) at 09/26/2020 1244 Last data filed at 09/26/2020 0900 Gross per 24 hour  Intake 180 ml  Output 425 ml  Net -245 ml        Physical Exam: Vital Signs Blood pressure 137/75, pulse (!) 53, temperature 98.4 F (36.9 C), temperature source Oral, resp. rate 18, height 5\' 11"  (1.803 m), weight 113.2 kg, SpO2 98 %.  Physical Exam   General: laying in bed- wearing shrinker, appropriate, NAD Mood and affect are flat, but cordial Heart: bradycardic, not in Afib-  Lungs: CTA B/L- no W/R/R- good air movement Abdomen: Soft, NT, ND, (+)BS  Extremities: No clubbing, cyanosis, or edema Skin: No evidence of breakdown, no evidence of rash   L AKA Skin:  L AKA- vertical incision- sutures in place- some sanguinous drainage from posterior aspect of incision on dressing- not saturated, but appears a little long for hematoma- shrinker and dressing were intact- when incision pressed, no more drainage coming/seeping out.  Neurologic: Ox3  Cranial nerves II through  XII intact, motor strength is 5/5 in bilateral deltoid, bicep, tricep, grip, Right hip flexor, knee extensors, ankle dorsiflexor and plantar flexor, 3 - left hip flexor,  Sensory exam normal sensation to light touch  in bilateral upper and lower extremities Musculoskeletal: L AKA- as above UE's 5/5 B/L; RLE- 5/5      Assessment/Plan: 1. Functional deficits which require 3+ hours per day of interdisciplinary therapy in a comprehensive inpatient rehab setting.  Physiatrist is providing close team supervision and 24 hour management of active medical problems listed below.  Physiatrist and rehab team continue to assess barriers to discharge/monitor patient progress toward functional and medical goals  Care Tool:  Bathing    Body parts bathed by patient: Right arm,Left arm,Chest,Abdomen,Front perineal area,Right upper leg,Buttocks,Left upper leg,Right lower leg,Face   Body parts bathed by helper: Right lower leg Body parts n/a: Left lower leg   Bathing assist Assist Level: Supervision/Verbal cueing (seated EOB)     Upper Body Dressing/Undressing Upper body dressing   What is the patient wearing?: Pull over shirt    Upper body assist Assist Level: Set up assist    Lower Body Dressing/Undressing Lower body dressing      What is the patient wearing?: Pants     Lower body assist Assist for lower body dressing: Contact Guard/Touching assist     Toileting Toileting    Toileting assist Assist for toileting: Minimal Assistance - Patient > 75%  Transfers Chair/bed transfer  Transfers assist     Chair/bed transfer assist level: Contact Guard/Touching assist (squat pivot) Chair/bed transfer assistive device: Geologist, engineering   Ambulation assist      Assist level: Minimal Assistance - Patient > 75% Assistive device: Walker-rolling Max distance: 81ft   Walk 10 feet activity   Assist     Assist level: Minimal Assistance - Patient >  75% Assistive device: Walker-rolling   Walk 50 feet activity   Assist Walk 50 feet with 2 turns activity did not occur: Safety/medical concerns         Walk 150 feet activity   Assist Walk 150 feet activity did not occur: Safety/medical concerns         Walk 10 feet on uneven surface  activity   Assist Walk 10 feet on uneven surfaces activity did not occur: Safety/medical concerns         Wheelchair     Assist Will patient use wheelchair at discharge?: Yes Type of Wheelchair: Manual    Wheelchair assist level: Supervision/Verbal cueing,Set up assist Max wheelchair distance: 150'    Wheelchair 50 feet with 2 turns activity    Assist        Assist Level: Supervision/Verbal cueing   Wheelchair 150 feet activity     Assist      Assist Level: Supervision/Verbal cueing   Blood pressure 137/75, pulse (!) 53, temperature 98.4 F (36.9 C), temperature source Oral, resp. rate 18, height 5\' 11"  (1.803 m), weight 113.2 kg, SpO2 98 %.  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  -patient may  shower -ELOS/Goals: 10-12d Sup/ModI goals 2. Antithrombotics: -DVT/anticoagulation:Chronic Eliquis has been resumed. -antiplatelet therapy:  3. Pain Management:Voltaren gel 4 times daily, Neurontin 400 mg 3 times daily, OxyContin CR 20 mg every 12 hours, oxycodone as needed, Robaxin 1000 mg every 6 hours as needed  1/25- on gabapentin - for phantom pain- will add Cymbalta 30 mg QHS for 4 days,then 60 mg QHS. Con't Oxycontin and Oxycodone.  1/26- will change Oxycodone to q4 hours prn 10 mg- and add Tramadol 50 mg q6 hours prn.   1/27- pt reports more manageable, but will encourage him to ask for meds. However not asked for tramadol YET_ will schedule tramadol and con't oxy prn q4 hours prn    1/28- will increase oxycontin to  30 mg BID for the moment- hopefully can go back to 20 mg BID by d/c? Might not- con't oxycodone and oxycontin and tramadol scheduled.  Started cymbalta 60mg  qhs on 1/29- may take several days/wks to achieve full effect  In addition pt took Oxy IR x 2 on 1/29  1/31- taken Oxy prn 3x/ since yesterday at 4pm- not taking as much as can- con't regimen- "manageable per pt now".  4. Mood:Provide emotional support -antipsychotic agents: N/A 5. Neuropsych: This patientiscapable of making decisions on hisown behalf. 6. Skin/Wound Care:Routine skin checks  1/24- eucerin BID added  1/27- will change dressing to BID and prn if saturated  1/28- recheck has eucerin BID  1/31- pt has specifically asked 2/28 to Call Dr 2/31 due to continued drainage- he's concerned about going home Friday with drainage/"not healing". I'm not sure it's a hematoma, but Hb last week was up to 7.9 - will recheck in AM 7. Fluids/Electrolytes/Nutrition:Routine in and outs with follow-up chemistries 8. Severe normocytic anemia. Follow-up CBC. Follow-up hematology services  1/25- Hb 7.5- stable so far.   1/27- will recheck labs in AM, esp since draining from L AKA incision  1/28- just came to get labs- awaiting/pending labs  1.31- Hb up to 7.9- not actively bleeding except the drainage from L AKA 9. Atrial fibrillation. Cardizem 240 mg daily, continue Eliquis. 10. 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 Latest creatinine 1.45, total CK 202. Follow-up renal services  1/27- will recheck in AM  1/28- labs pending  1.31- Cr down to 1.28- will recheck in AM 11. Hypertension. Aldactone 25 mg dailyNormodyne 400 mg twice daily. Monitor with increased mobility  1/24- BP 145/91- slightly elevated- but will monitor since on multiple meds.  1/26- BP 190/95- will give prn-  Hydralazine for very elevated Bps.  Will add 25 mg q6 hours for SBP >180 or DBP >110.   1/27- BP 160s/81 this AM-will add  hydralazine scheduled for BP 10  mg TID  1/28- BP 169/87- added Hydralazine 10 mg TID yesterday- might need to increase over weekend.  Vitals:   09/26/20 0507 09/26/20 0913  BP: (!) 154/80 137/75  Pulse: 62 (!) 53  Resp: 19 18  Temp: 98.1 F (36.7 C) 98.4 F (36.9 C)  SpO2: 95% 98%  still elevated will increase hydralazine to QID- has not used prn 25mg  dose  1/31- BP 150s systolic- doing better- con't regimen 12. Aortic dissection with repair. Follow-up outpatient 13. Obesity. Dietary follow-up 14. Bronchospasms/wheezing  1/26- will add Albuterol nebs q4 hours prn- pt needs to get this AM.   1/27- will encourage nursing to do/ask for if pt has Sx's.  15. Noncardiac CP?  1/31- per Cards, CP this Am was GI related- Troponin agrees with this- will con't to monitor.   LOS: 7 days A FACE TO FACE EVALUATION WAS PERFORMED  Brady Plant 09/26/2020, 12:44 PM

## 2020-09-27 LAB — CBC WITH DIFFERENTIAL/PLATELET
Abs Immature Granulocytes: 0.02 10*3/uL (ref 0.00–0.07)
Basophils Absolute: 0.1 10*3/uL (ref 0.0–0.1)
Basophils Relative: 1 %
Eosinophils Absolute: 0.6 10*3/uL — ABNORMAL HIGH (ref 0.0–0.5)
Eosinophils Relative: 9 %
HCT: 26.3 % — ABNORMAL LOW (ref 39.0–52.0)
Hemoglobin: 8.2 g/dL — ABNORMAL LOW (ref 13.0–17.0)
Immature Granulocytes: 0 %
Lymphocytes Relative: 17 %
Lymphs Abs: 1.2 10*3/uL (ref 0.7–4.0)
MCH: 29.7 pg (ref 26.0–34.0)
MCHC: 31.2 g/dL (ref 30.0–36.0)
MCV: 95.3 fL (ref 80.0–100.0)
Monocytes Absolute: 0.8 10*3/uL (ref 0.1–1.0)
Monocytes Relative: 12 %
Neutro Abs: 4.2 10*3/uL (ref 1.7–7.7)
Neutrophils Relative %: 61 %
Platelets: 310 10*3/uL (ref 150–400)
RBC: 2.76 MIL/uL — ABNORMAL LOW (ref 4.22–5.81)
RDW: 16.2 % — ABNORMAL HIGH (ref 11.5–15.5)
WBC: 6.9 10*3/uL (ref 4.0–10.5)
nRBC: 0 % (ref 0.0–0.2)

## 2020-09-27 LAB — BASIC METABOLIC PANEL
Anion gap: 9 (ref 5–15)
BUN: 15 mg/dL (ref 8–23)
CO2: 27 mmol/L (ref 22–32)
Calcium: 8.6 mg/dL — ABNORMAL LOW (ref 8.9–10.3)
Chloride: 103 mmol/L (ref 98–111)
Creatinine, Ser: 1.32 mg/dL — ABNORMAL HIGH (ref 0.61–1.24)
GFR, Estimated: >60 mL/min (ref >60)
Glucose, Bld: 96 mg/dL (ref 70–99)
Potassium: 4.2 mmol/L (ref 3.5–5.1)
Sodium: 139 mmol/L (ref 135–145)

## 2020-09-27 MED ORDER — HYDRALAZINE HCL 25 MG PO TABS
25.0000 mg | ORAL_TABLET | Freq: Three times a day (TID) | ORAL | Status: DC
  Administered 2020-09-27 – 2020-10-01 (×12): 25 mg via ORAL
  Filled 2020-09-27 (×12): qty 1

## 2020-09-27 NOTE — Progress Notes (Signed)
Physical Therapy Session Note  Patient Details  Name: Connor [name] MRN: [id number] Date of Birth: 1956-02-23  Today's Date: 09/27/2020 PT Individual Time: 1331-1425 PT Individual Time Calculation (min): 54 min   Short Term Goals: Week 1:  PT Short Term Goal 1 (Week 1): Patient will complete all bed mobility with no more CGA PT Short Term Goal 2 (Week 1): Patient will complete bed<> wc transfer with no more than CGA consistently and LRAD PT Short Term Goal 3 (Week 1): Patient will tolerate standing >23mins with LRAD and no more than CGA PT Short Term Goal 4 (Week 1): Patient will be able to teachback s/s of poor wound healing/infection in residual limb    Skilled Therapeutic Interventions/Progress Updates:    pt received in bed and agreeable to therapy, reported 7/10 residual limb pain. Pt directed in supine>sit from flat bed CGA with bedrail; sat EOB mod I; requested to remain in room for therapy session. Pt directed in 2x10 Sit to stand with Rolling walker at [facility] and one rep with min A 2/2 LOB posteriorly. Pt then directed in gait training with Rolling walker for 20' CGA straight path, min A with turns and x2 static standing rest breaks. PT then educated pt on need to complete stair training as pt does not have a ramp, pt reported he doesn't feel he needs to do stairs as he was able to get it before and has family assist, pt educated on importance of attempting while here and improve safety. Pt agreed to during stay however denied for this session. Pt then agreeable to leave room for [facility] mobility, Stand pivot transfer to/from bed to/from [facility] CGA with Rolling walker. Then directed in [facility] mobility for 250' CGA with x3 rest breaks. Pt then requested to return to bed, at end of session, CGA. Pt left in bed, alarm set, All needs in reach and in good condition. Call light in hand.   Therapy Documentation Precautions:  Precautions Precautions: Fall Precaution Comments: L AKA Restrictions Weight  Bearing Restrictions: No LLE Weight Bearing: Weight bearing as tolerated General:   Vital Signs: Therapy Vitals Temp: 97.7 F (36.5 C) Temp Source: Oral Pulse Rate: 60 Resp: 20 BP: (!) 151/78 Patient Position (if appropriate): Sitting Oxygen Therapy SpO2: 97 % O2 Device: Room Air Pain:   Mobility:   Locomotion :    Trunk/Postural Assessment :    Balance:   Exercises:   Other Treatments:      Therapy/Group: Individual Therapy  Barbaraann Faster 09/27/2020, 3:33 PM

## 2020-09-27 NOTE — Progress Notes (Addendum)
Team Conference Report to Patient/Family  Team Conference discussion was reviewed with the patient and caregiver, including goals, any changes in plan of care and target discharge date.  Patient and caregiver express understanding and are in agreement.  The patient has a target discharge date of 09/30/20.  Andria Rhein 09/27/2020, 1:49 PM

## 2020-09-27 NOTE — Progress Notes (Signed)
Patient ID: Connor [name], male   DOB: [birth year], 65 y.o.   MRN: [id number]   Patient referral faxed to [facility] [place], [place] [phone]

## 2020-09-27 NOTE — Progress Notes (Signed)
Physical Therapy Session Note  Patient Details  Name: Connor [name] MRN: [id number] Date of Birth: 05-28-1956  Today's Date: 09/27/2020 PT Individual Time: 1030-1059 PT Individual Time Calculation (min): 29 min   Short Term Goals: Week 1:  PT Short Term Goal 1 (Week 1): Patient will complete all bed mobility with no more CGA PT Short Term Goal 2 (Week 1): Patient will complete bed<> wc transfer with no more than CGA consistently and LRAD PT Short Term Goal 3 (Week 1): Patient will tolerate standing >4mins with LRAD and no more than CGA PT Short Term Goal 4 (Week 1): Patient will be able to teachback s/s of poor wound healing/infection in residual limb  Skilled Therapeutic Interventions/Progress Updates:    pt received in bed and agreeable to therapy only in room at EOB. Pt denied to transfer to [facility]. Pt directed in supine>sit with HOB elevated CGA, sat EOB CGA throughout session. Pt directed in seated BLE strengthening exercises of marching and LAQ and ankle pumps on RLE only 2x20 and leg lifts on LLE only and hip abduction and abduction on BLE all 2x20. Pt directed in seated BUE bicep curls 2x10 3#. Pt directed in sit>supine at end of session CGA, reported 8/10 pain throughout session in residual limb, denied chest pain, headaches, shortness of breath during session multiple times. Pt grossly lethargic though able to answer all questions and participate in above activities, however largely with eyes closed. Pt left in supine, alarm set, All needs in reach and in good condition. Call light in hand.    Therapy Documentation Precautions:  Precautions Precautions: Fall Precaution Comments: L AKA Restrictions Weight Bearing Restrictions: No LLE Weight Bearing: Weight bearing as tolerated General:   Vital Signs:  Pain: Pain Assessment Pain Scale: 0-10 Pain Score: 3  Mobility:   Locomotion :    Trunk/Postural Assessment :    Balance:   Exercises:   Other Treatments:       Therapy/Group: Individual Therapy  Barbaraann Faster 09/27/2020, 11:28 AM

## 2020-09-27 NOTE — Progress Notes (Signed)
Occupational Therapy Session Note  Patient Details  Name: Connor [name] MRN: [id number] Date of Birth: Apr 09, 1956  Today's Date: 09/27/2020 OT Individual Time: 5631-4970   &   1330-1330 OT Individual Time Calculation (min): 70 min    &   30 min   Short Term Goals: Week 1:  OT Short Term Goal 1 (Week 1): Pt will perform BSC/toilet transfers with CGA OT Short Term Goal 2 (Week 1): Pt will perform LB bathe/dress with CGA using AE PRN OT Short Term Goal 3 (Week 1): Pt will perform ADL/activity for 15+ minutes without rest break OT Short Term Goal 4 (Week 1): Pt will perform clothing management standing with CGA  Skilled Therapeutic Interventions/Progress Updates:    Session 1:   Patient in bed, alert and denies pain.  He requests to do exercises in room prior to completing shower - able to move to edge of bed with CS.  Completed upper body and core strengthening exercises with CS.  Sit to stand and ambulation with RW to/from bed, shower bench, arm chair with CGA.  Shower completed seated on shower bench with CS (residual limb covered)  Dressing completed seated in arm chair min A for incontinence brief, set up for hospital gown (does not have clothing available)  He is able to donn slipper sock.  He returned to bed at close of session due to fatigue.  Sit to supine CS, bed alarm set and call bell in reach.     Session 2:   Patient in bed, asleep - awake with min cues.  He notes fatigue but is willing to complete activities in the room.  Supine to sit with CS.  Sit to stand from bed surface with CGA - he is able to maintain standing for posture and left hip stretches.  Completed theraband and UB dowel exercises in unsupported sitting position.  He declined transfer to w/c or activities out of the room at this time.  He returned to supine position at close of session.  Bed alarm set and call bell in hand.       Therapy Documentation Precautions:  Precautions Precautions: Fall Precaution  Comments: L AKA Restrictions Weight Bearing Restrictions: No LLE Weight Bearing: Weight bearing as tolerated   Therapy/Group: Individual Therapy  Barrie Lyme 09/27/2020, 7:40 AM

## 2020-09-27 NOTE — Patient Care Conference (Signed)
Inpatient RehabilitationTeam Conference and Plan of Care Update Date: 09/27/2020   Time: 11:54 AM    Patient Name: Connor [name]      Medical Record Number: [id number]  Date of Birth: 01/01/1956 Sex: Male         Room/Bed: 4W24C/4W24C-01 Payor Info: Payor: MEDICAID [place] / Plan: MEDICAID [place] ACCESS / Product Type: *No Product type* /    Admit Date/Time:  09/19/2020  2:34 PM  Primary Diagnosis:  History of left above knee amputation [facility])  Hospital Problems: Principal Problem:   History of left above knee amputation [facility]) Active Problems:   Left above-knee amputee [facility])    Expected Discharge Date: Expected Discharge Date: 09/30/20  Team Members Present: Physician leading conference: Dr. Genice Rouge Care Coodinator Present: Lavera Guise, BSW;Shambhavi Salley Marlyne Beards, RN, BSN, CRRN Nurse Present: Margot Ables, LPN PT Present: Otelia Sergeant, PT OT Present: Towanda Malkin, OT PPS Coordinator present : Edson Snowball, Park Breed, SLP     Current Status/Progress Goal Weekly Team Focus  Bowel/Bladder   conintent of bowel and bladder  Remain continent of bowel and bladder with minimal assistance.  assess bowel and bladder needs q 2 hours   Swallow/Nutrition/ Hydration             ADL's   adl min A, functional transfers and ambulation with RW household distances with CGA  Supervision  adl training, functional transfer training, balance and conditioning   Mobility   GCA bed mobility with bedrails; min A transfers to Rt side; has been refusing gait intermittently 2/2 pain in R lateral foot; min A WC mobility 100'  supervision transfers and gait 20-30' with RW, WC 150' mod I  transfers, tolerance to activity, WC mobility, standing balance   Communication             Safety/Cognition/ Behavioral Observations            Pain   patient states 6/10 pain  Patient rates pain < or equal to 3/10  assess pain q shift and prn   Skin   Left BKA, no skin breakdown  Prevent skin  breakdown, and to remain free from infection  assess skin q shift and prn     Discharge Planning:  Discharging home with cousins (unable to provide physical assistance) Primary caregiver: niece (supervision)   Team Discussion: Had GI pain yesterday versus chest pain. BP running high, increased Hydralazine. Pain is manageable, continent B/B, dressing changes [name] shift. Discharge plan has not changed. Patient on target to meet rehab goals: Into shower with supervision, covered dressing. Needs clothes from home to do proper ADL education. This is a barrier. Patient fatigues quickly. Most of the PT session was spent with eyes closed. Complained of lateral foot pain. Patient has no shoes, that is also a barrier. There is no ramp to enter home but on 2 steps according to the patient.  *See Care Plan and progress notes for long and short-term goals.   Revisions to Treatment Plan:  Continue with current plan.  Teaching Needs: Family education, medication management, wound/skin care, transfer training, gait training.  Current Barriers to Discharge: Inaccessible home environment, Decreased caregiver support, Home enviroment access/layout, Wound care, Lack of/limited family support, Weight, Weight bearing restrictions, Medication compliance, Behavior and Nutritional means  Possible Resolutions to Barriers: Continue current medications, weight bearing precautions, offer nutritional support, provide emotional support to patient and family.     Medical Summary Current Status: pain mangeable; L AKA some drainage- hematoma? continent B/B- LBM yesterday?  Barriers to Discharge: Decreased family/caregiver support;Home enviroment access/layout;Weight;Weight bearing restrictions;Wound care;Medical stability  Barriers to Discharge Comments: got shower this AM- refused hospital scrubs- wants gown;  d/c to cousin's home Possible Resolutions to Barriers/Weekly Focus: not done as well in PT last 2 days;  lethargic last 2 days- eyes closed; mumbled speech; stayed in room; lateral R foot pain- can't get shoes per pt; sweaty-nml for him; has 2 steps- doesn't have ramp;  d/c date set 2/4- this friday.   Continued Need for Acute Rehabilitation Level of Care: The patient requires daily medical management by a physician with specialized training in physical medicine and rehabilitation for the following reasons: Direction of a multidisciplinary physical rehabilitation program to maximize functional independence : Yes Medical management of patient stability for increased activity during participation in an intensive rehabilitation regime.: Yes Analysis of laboratory values and/or radiology reports with any subsequent need for medication adjustment and/or medical intervention. : Yes   I attest that I was present, lead the team conference, and concur with the assessment and plan of the team.   Tennis Must 09/27/2020, 4:29 PM

## 2020-09-27 NOTE — Progress Notes (Signed)
Patient ID: Connor [name], male   DOB: 14-Feb-1956, 65 y.o.   MRN: [id number]  Patient declined by [facility] due to staffing.  [name], [place] [phone]

## 2020-09-27 NOTE — Progress Notes (Signed)
Patient ID: Connor [name], male   DOB: May 22, 1956, 65 y.o.   MRN: [id number]   Sw made 2 attempts to reach patient niece to provide with team conference updates, request clothing and confirm d/c plan. Let VM. Will continue to follow up.   [place], [place] [phone]

## 2020-09-27 NOTE — Progress Notes (Signed)
Patient ID: Connor [name], male   DOB: 03-20-1956, 65 y.o.   MRN: [id number]   DME ordered through [employer]: [facility], and [facility]  Lavera Guise, [place] [phone]

## 2020-09-27 NOTE — Progress Notes (Signed)
[place] PHYSICAL MEDICINE & REHABILITATION PROGRESS NOTE   Subjective/Complaints:  Pt reports PA from Dr Audrie Lia office, Nita Sells [employer]- came to see him this AM- said to him the drainage should be resolved by Friday-  And also told him that sutures are dissolvable in L AKA- which confused me.   Pt reports bowels OK.    ROS:   Pt denies SOB, abd pain, CP, N/V/C/D, and vision changes   Objective:   No results found. Recent Labs    09/27/20 0444  WBC 6.9  HGB 8.2*  HCT 26.3*  PLT 310   Recent Labs    09/27/20 0444  NA 139  K 4.2  CL 103  CO2 27  GLUCOSE 96  BUN 15  CREATININE 1.32*  CALCIUM 8.6*    Intake/Output Summary (Last 24 hours) at 09/27/2020 1004 Last data filed at 09/27/2020 0429 Gross per 24 hour  Intake 300 ml  Output 300 ml  Net 0 ml        Physical Exam: Vital Signs Blood pressure (!) 165/80, pulse 63, temperature 97.8 F (36.6 C), temperature source Oral, resp. rate 18, height 5\' 11"  (1.803 m), weight 113.2 kg, SpO2 92 %.  Physical Exam   General: sitting up in bed; appropriate, NAD Mood and affect are still flat/cordial Heart: RRR- no JVD seen- no Afib heard Lungs: CTA B/L- no W/R/R- good air movement Abdomen: Soft, NT, ND, (+)BS  Extremities: No clubbing, cyanosis, or edema Skin: No evidence of breakdown, no evidence of rash   L AKA Skin:  L AKA- vertical incision- sutures in place- some sanguinous drainage from posterior aspect of incision on dressing- not saturated, but appears a little long for hematoma- shrinker and dressing were intact- when incision pressed, no more drainage coming/seeping out.- in shrinker today- C/D/I dressing- just changed by PA. Of note, pt has thick black sutures.    Neurologic: Ox3  Cranial nerves II through XII intact, motor strength is 5/5 in bilateral deltoid, bicep, tricep, grip, Right hip flexor, knee extensors, ankle dorsiflexor and plantar flexor, 3 - left hip flexor,  Sensory exam normal  sensation to light touch  in bilateral upper and lower extremities Musculoskeletal: L AKA- as above UE's 5/5 B/L; RLE- 5/5      Assessment/Plan: 1. Functional deficits which require 3+ hours per day of interdisciplinary therapy in a comprehensive inpatient rehab setting.  Physiatrist is providing close team supervision and 24 hour management of active medical problems listed below.  Physiatrist and rehab team continue to assess barriers to discharge/monitor patient progress toward functional and medical goals  Care Tool:  Bathing    Body parts bathed by patient: Right arm,Left arm,Chest,Abdomen,Front perineal area,Right upper leg,Buttocks,Left upper leg,Right lower leg,Face   Body parts bathed by helper: Right lower leg Body parts n/a: Left lower leg   Bathing assist Assist Level: Supervision/Verbal cueing (seated EOB)     Upper Body Dressing/Undressing Upper body dressing   What is the patient wearing?: Pull over shirt    Upper body assist Assist Level: Set up assist    Lower Body Dressing/Undressing Lower body dressing      What is the patient wearing?: Pants     Lower body assist Assist for lower body dressing: Contact Guard/Touching assist     Toileting Toileting    Toileting assist Assist for toileting: Minimal Assistance - Patient > 75%     Transfers Chair/bed transfer  Transfers assist     Chair/bed transfer assist level: Contact Guard/Touching  assist (squat pivot) Chair/bed transfer assistive device: Arboriculturist assist      Assist level: Minimal Assistance - Patient > 75% Assistive device: Walker-rolling Max distance: 56ft   Walk 10 feet activity   Assist     Assist level: Minimal Assistance - Patient > 75% Assistive device: Walker-rolling   Walk 50 feet activity   Assist Walk 50 feet with 2 turns activity did not occur: Safety/medical concerns         Walk 150 feet activity   Assist  Walk 150 feet activity did not occur: Safety/medical concerns         Walk 10 feet on uneven surface  activity   Assist Walk 10 feet on uneven surfaces activity did not occur: Safety/medical concerns         Wheelchair     Assist Will patient use wheelchair at discharge?: Yes Type of Wheelchair: Manual    Wheelchair assist level: Supervision/Verbal cueing,Set up assist Max wheelchair distance: 150'    Wheelchair 50 feet with 2 turns activity    Assist        Assist Level: Supervision/Verbal cueing   Wheelchair 150 feet activity     Assist      Assist Level: Supervision/Verbal cueing   Blood pressure (!) 165/80, pulse 63, temperature 97.8 F (36.6 C), temperature source Oral, resp. rate 18, height 5\' 11"  (1.803 m), weight 113.2 kg, SpO2 92 %.  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  -patient may  shower -ELOS/Goals: 10-12d Sup/ModI goals  2/1- team conference today to update progress and finalize d/c plans 2. Antithrombotics: -DVT/anticoagulation:Chronic Eliquis has been resumed. -antiplatelet therapy:  3. Pain Management:Voltaren gel 4 times daily, Neurontin 400 mg 3 times daily, OxyContin CR 20 mg every 12 hours, oxycodone as needed, Robaxin 1000 mg every 6 hours as needed  1/25- on gabapentin - for phantom pain- will add Cymbalta 30 mg QHS for 4 days,then 60 mg QHS. Con't Oxycontin and Oxycodone.  1/26- will change Oxycodone to q4 hours prn 10 mg- and add Tramadol 50 mg q6 hours prn.   1/27- pt reports more manageable, but will encourage him to ask for meds. However not asked for tramadol YET_ will schedule tramadol and con't oxy prn q4 hours prn    1/28- will increase oxycontin to 30 mg BID for the moment- hopefully can go back to 20 mg BID by d/c? Might not- con't oxycodone and oxycontin and tramadol  scheduled.  Started cymbalta 60mg  qhs on 1/29- may take several days/wks to achieve full effect  In addition pt took Oxy IR x 2 on 1/29  1/31- taken Oxy prn 3x/ since yesterday at 4pm- not taking as much as can- con't regimen- "manageable per pt now".   2/1- pain is more nerve pain today and L AKA feels "numb". con't regimen 4. Mood:Provide emotional support -antipsychotic agents: N/A 5. Neuropsych: This patientiscapable of making decisions on hisown behalf. 6. Skin/Wound Care:Routine skin checks  1/24- eucerin BID added  1/27- will change dressing to BID and prn if saturated  1/28- recheck has eucerin BID  1/31- pt has specifically asked 2/28 to Call Dr 2/31 due to continued drainage- he's concerned about going home Friday with drainage/"not healing". I'm not sure it's a hematoma, but Hb last week was up to 7.9 - will recheck in AM 7. Fluids/Electrolytes/Nutrition:Routine in and outs with follow-up  chemistries 8. Severe normocytic anemia. Follow-up CBC. Follow-up hematology services  1/25- Hb 7.5- stable so far.   1/27- will recheck labs in AM, esp since draining from L AKA incision  1/28- just came to get labs- awaiting/pending labs  1.31- Hb up to 7.9- not actively bleeding except the drainage from L AKA 9. Atrial fibrillation. Cardizem 240 mg daily, continue Eliquis. 10. 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 Latest creatinine 1.45, total CK 202. Follow-up renal services  1/27- will recheck in AM  1/28- labs pending  1.31- Cr down to 1.28- will recheck in AM  2/1- Cr 1.32- stable- con't regimen 11. Hypertension. Aldactone 25 mg dailyNormodyne 400 mg twice daily. Monitor with increased mobility  1/24- BP 145/91- slightly elevated- but will monitor since on multiple meds.  1/26- BP 190/95- will give prn-  Hydralazine for very elevated Bps.  Will add 25 mg q6 hours for SBP >180 or DBP >110.   1/27- BP 160s/81 this AM-will add  hydralazine scheduled for BP 10 mg  TID  1/28- BP 169/87- added Hydralazine 10 mg TID yesterday- might need to increase over weekend.  Vitals:   09/26/20 1958 09/27/20 0430  BP: (!) 151/79 (!) 165/80  Pulse: 70 63  Resp: 18   Temp: 98.1 F (36.7 C) 97.8 F (36.6 C)  SpO2: 94% 92%  still elevated will increase hydralazine to QID- has not used prn 25mg  dose  1/31- BP 150s systolic- doing better- con't regimen  2/1- BP 150s-160s /80s- will increase hydralazine to 25 mg q8 hours for better BP control 12. Aortic dissection with repair. Follow-up outpatient 13. Obesity. Dietary follow-up 14. Bronchospasms/wheezing  1/26- will add Albuterol nebs q4 hours prn- pt needs to get this AM.   1/27- will encourage nursing to do/ask for if pt has Sx's.  15. Noncardiac CP?  1/31- per Cards, CP this Am was GI related- Troponin agrees with this- will con't to monitor.  16. L AKA  2/1- still having drainage from posterior aspect of incision- and per Surgery PA- and d/w pt- pt was told sutures dissolvable- but will have him f/u with them after d/c.   LOS: 8 days A FACE TO FACE EVALUATION WAS PERFORMED  Rylan Bernard 09/27/2020, 10:04 AM

## 2020-09-27 NOTE — Progress Notes (Signed)
Patient is status post left above-knee amputation by Dr. Lajoyce Corners.  He is currently in the rehab unit.  We were asked to evaluate him because of some increased drainage especially in the posterior aspect of the wound.  Patient states that a few nights ago it did have a foul odor.  He says his pain is about the same and the amputation stump is either numb or has some neuropathic pain  Vital signs stable patient is alert pleasant lying in bed.  Most recent white count was 6.9.  Wound was examined he has some small amount of bloody drainage on the posterior aspect on the dressing.  There is no purulent or serous drainage I cannot appreciate a foul odor at this time as well approximated wound edges moderate soft tissue swelling but no cellulitis.   We will continue to follow patient plans to be discharged on Friday will need a 1 week follow-up in our office

## 2020-09-27 NOTE — Progress Notes (Addendum)
Progress Note  Patient Name: Connor [name] Date of Encounter: 09/27/2020  [facility] Cardiologist: Little Ishikawa, MD   Subjective   Doing much better today. No complaints of chest pain. Had not worked PT yet.   Inpatient Medications    Scheduled Meds: . apixaban  5 mg Oral BID  . diclofenac Sodium  2 g Topical QID  . diltiazem  240 mg Oral Daily  . DULoxetine  60 mg Oral QHS  . feeding supplement  237 mL Oral BID BM  . gabapentin  400 mg Oral TID  . hydrALAZINE  25 mg Oral Q8H  . hydrocerin   Topical BID  . labetalol  400 mg Oral BID  . oxyCODONE  30 mg Oral Q12H  . polyethylene glycol  17 g Oral Daily  . spironolactone  25 mg Oral Daily  . traMADol  50 mg Oral Q6H   Continuous Infusions:  PRN Meds: albuterol, hydrALAZINE, methocarbamol, ondansetron **OR** ondansetron (ZOFRAN) IV, oxyCODONE   Vital Signs    Vitals:   09/26/20 0913 09/26/20 1310 09/26/20 1958 09/27/20 0430  BP: 137/75 130/70 (!) 151/79 (!) 165/80  Pulse: (!) 53 (!) 52 70 63  Resp: 18 15 18    Temp: 98.4 F (36.9 C) 98 F (36.7 C) 98.1 F (36.7 C) 97.8 F (36.6 C)  TempSrc: Oral Oral Oral Oral  SpO2: 98% (!) 89% 94% 92%  Weight:      Height:        Intake/Output Summary (Last 24 hours) at 09/27/2020 1319 Last data filed at 09/27/2020 0429 Gross per 24 hour  Intake 300 ml  Output 300 ml  Net 0 ml   Last 3 Weights 09/19/2020 09/11/2020 09/09/2020  Weight (lbs) 249 lb 9 oz 251 lb 1.7 oz 241 lb 10 oz  Weight (kg) 113.2 kg 113.9 kg 109.6 kg      Telemetry    N/a - Personally Reviewed  ECG    N/a  Physical Exam   GEN: No acute distress.   Neck: No JVD Cardiac: RRR, no murmurs, rubs, or gallops.  Respiratory: Clear to auscultation bilaterally. GI: Soft, nontender, non-distended  MS: No edema; Left AKA Neuro:  Nonfocal  Psych: Normal affect   Labs    High Sensitivity Troponin:   Recent Labs  Lab 08/30/20 1926 08/30/20 2215 09/06/20 2147 09/07/20 0019  09/26/20 1035  TROPONINIHS 425* 482* 32* 33* 13      Chemistry Recent Labs  Lab 09/23/20 0809 09/27/20 0444  NA 137 139  K 3.6 4.2  CL 102 103  CO2 25 27  GLUCOSE 111* 96  BUN 15 15  CREATININE 1.28* 1.32*  CALCIUM 8.5* 8.6*  GFRNONAA >60 >60  ANIONGAP 10 9     Hematology Recent Labs  Lab 09/23/20 0809 09/27/20 0444  WBC 7.4 6.9  RBC 2.71* 2.76*  HGB 7.9* 8.2*  HCT 26.2* 26.3*  MCV 96.7 95.3  MCH 29.2 29.7  MCHC 30.2 31.2  RDW 16.7* 16.2*  PLT 227 310    BNPNo results for input(s): BNP, PROBNP in the last 168 hours.   DDimer No results for input(s): DDIMER in the last 168 hours.   Radiology    No results found.  Cardiac Studies   Echo: 09/05/20  IMPRESSIONS    1. Left ventricular ejection fraction, by estimation, is 60 to 65%. The  left ventricle has normal function.  2. Right ventricular systolic function is normal. The right ventricular  size is normal. Pulmonary artery  systolic pressure is 25mmHg+ RAP.  3. Left atrial size was severely dilated. No left atrial/left atrial  appendage thrombus was detected.  4. The mitral valve is normal in structure. Trivial mitral valve  regurgitation.  5. The aortic valve is tricuspid. Aortic valve regurgitation is trivial.  No aortic stenosis is present.  6. Agitated saline contrast bubble study was positive with shunting  observed after >6 cardiac cycles suggestive of intrapulmonary shunting.  7. Right atrial size was severely dilated.  8. No valvular vegetations visualized.   Patient Profile     65 y.o. male with PMH of aortic dissection s/p repair in 2016, developed dissection at anastomosis suture lines 2019 which was c/b sternal infection and cardiogenic shock with EF down to 15%, persistent atrial fibrillation, HTN who was recently admitted during 08/30/20-09/19/20 for bacteremia and found to have necrotizing fascitis of left leg. Underwent left AKA 09/07/20 with revision on 09/09/20 and placement of  wound vac. Developed AKI on CKD 2/2 acute tubular injury.  Required transfusions for post op anemia. Was able to resume on chronic anticoagulation. Discharged to CIR from [facility] service.   Assessment & Plan    1. Chest pain: developed after he took several pills, felt like a "pressing down, pressure like sensation" that resolved on it's own. Was quite painful when it happened. EKG showed SB with no acute ST/TW changes. Negative hsTn. No further episodes per patient report.   2. Hx of AAA s/p repair '16 and '19 c/b sternal infection and cardiogenic shock: Blood pressures were soft during prior admission. Now stable with Diltiazem 240mg  daily, labetalol 400mg  BID, hydralazine 10mg  QID and spiro 25mg  daily  3. Necrotizing fasciitis s/p left AKA: done on 1/12. Now in CIR for rehab working with PT  4. Persistent Afib: remains in SR. -- Stroke prevention with Eliquis 5mg  BID  5. Anemia: c/b post op anemia. Received 7 units of PRBCs during recent admission. Hgb hovers around 7.0-8.0 range. Last check 2/1 8.2   For questions or updates, please contact [facility] Please consult [email or web address] for contact info under     Signed, , NP  09/27/2020, 1:19 PM    Patient seen, examined. Available data reviewed. Agree with findings, assessment, and plan as outlined by .  The patient has had no further chest discomfort.  His troponin levels were flat and within normal limits yesterday.  His EKG showed no ischemic changes.  I agree with the plan as outlined above.  We will sign off today and recommend continuing his current medical program without any changes.  Please call if we can be of any further assistance in his care.  3/12, M.D. 09/27/2020 1:30 PM

## 2020-09-28 NOTE — Progress Notes (Signed)
Patient ID: Connor [name], male   DOB: 05-14-1956, 65 y.o.   MRN: [id number]   Patient declined by [facility]   [place], [place] [phone]

## 2020-09-28 NOTE — Progress Notes (Addendum)
Physical Therapy Session Note  Patient Details  Name: Connor [name] MRN: [id number] Date of Birth: 1956-05-01  Today's Date: 09/28/2020 PT Individual Time: 0805-0915 PT Individual Time Calculation (min): 70 min   Short Term Goals: Week 1:  PT Short Term Goal 1 (Week 1): Patient will complete all bed mobility with no more CGA PT Short Term Goal 2 (Week 1): Patient will complete bed<> wc transfer with no more than CGA consistently and LRAD PT Short Term Goal 3 (Week 1): Patient will tolerate standing >64mins with LRAD and no more than CGA PT Short Term Goal 4 (Week 1): Patient will be able to teachback s/s of poor wound healing/infection in residual limb  Skilled Therapeutic Interventions/Progress Updates: Pt presented in bed requesting to use bathroom. BSC set up and pt performed supine to sit with supervision and use of bed features. Performed stand pivot transfer to [facility] with RW and CGA (+void/BM). Performed peri-care mod I performing lateral leans and transferred back to EOB in same manner as prior. MD present for daily assessment and dsg changed by nsg. Pt re-donned shrinker once completed, but would not secure fastener. Pt agreeable to EOB therex with PTA providing education on attempting step due to pending d/c date however pt minimally receptive. Provided edu on importance of attempting prior to d/c so to come up with alternate plan (w/c bump) if necessary. Pt performed RLE with 3l cuff as follows: LAQ, hip flexion, hip abd/add x 20 ea. Hamstring pulls with level 4 theraband x 20, hip add with manual resistance x 20. Pt performed STS from EOB with CGA and maintained standing while PTA donned new brief. Pt then performed stand pivot with CGA to w/c and moved over to sink to perform oral hygiene and wash face mod I in w/c. Pt then agreeable to leave room for w/c mobility, pt propelled in unit >341ft with supervision and demonstrating good safety for obstacle negotiation and requiring x 1 rest break  due to BUE fatigue. Performed ambulatory transfer back to bed  ~58ft with RW and CGA and pt left seated EOB with bed alarm on, and Mark, NT notified of pt's disposition.      Therapy Documentation Precautions:  Precautions Precautions: Fall Precaution Comments: L AKA Restrictions Weight Bearing Restrictions: No LLE Weight Bearing: Non weight bearing General:   Vital Signs:  Pain:   Mobility:   Locomotion :    Trunk/Postural Assessment :    Balance:   Exercises:   Other Treatments:      Therapy/Group: Individual Therapy  Anees Vanecek 09/28/2020, 12:33 PM

## 2020-09-28 NOTE — Progress Notes (Signed)
Occupational Therapy Session Note  Patient Details  Name: Connor [name] MRN: [id number] Date of Birth: 11-21-1955  Today's Date: 09/28/2020 OT Individual Time: 1520-1600 OT Individual Time Calculation (min): 40 min    Short Term Goals: Week 1:  OT Short Term Goal 1 (Week 1): Pt will perform BSC/toilet transfers with CGA OT Short Term Goal 2 (Week 1): Pt will perform LB bathe/dress with CGA using AE PRN OT Short Term Goal 3 (Week 1): Pt will perform ADL/activity for 15+ minutes without rest break OT Short Term Goal 4 (Week 1): Pt will perform clothing management standing with CGA  Skilled Therapeutic Interventions/Progress Updates:    1:1. Pt received in bed agreeable to OT. Pt copeltes all transfers with A for set up of w/c and supervision via squat pivot. Education on core stability to improve transfers/standing/gait. Pt completes overhead reach with 6# med ball with modified sit ups to wedge 2x10, russian twist 2x10 and lateral elbow taps to target 2x10 with rest d/t poor activity tolernace. Pt reporting increased pain and edcated on mirror therapy. Pt reporting helping resolve pain by completing LAQ, heel and toe raises and looking at mirror. Exited session with pt seated in bed, exit alarm on and call light in reach   Therapy Documentation Precautions:  Precautions Precautions: Fall Precaution Comments: L AKA Restrictions Weight Bearing Restrictions: No LLE Weight Bearing: Weight bearing as tolerated General:   Vital Signs: Therapy Vitals Temp: 97.9 F (36.6 C) Pulse Rate: 60 Resp: 18 BP: (!) 155/78 Patient Position (if appropriate): Lying Oxygen Therapy SpO2: 96 % O2 Device: Room Air Pain:   ADL: ADL Eating: Not assessed Grooming: Not assessed Upper Body Bathing: Supervision/safety Where Assessed-Upper Body Bathing: Edge of bed Lower Body Bathing: Minimal assistance Where Assessed-Lower Body Bathing: Edge of bed Upper Body Dressing: Supervision/safety Where  Assessed-Upper Body Dressing: Edge of bed Lower Body Dressing: Not assessed (not assessed d/t time constraints) Toileting: Minimal assistance Where Assessed-Toileting: Bedside Commode Toilet Transfer: Minimal assistance Toilet Transfer Method: Stand Wellsite geologist: Tourist information centre manager    Praxis   Exercises:   Other Treatments:     Therapy/Group: Individual Therapy  Shon Hale 09/28/2020, 6:54 AM

## 2020-09-28 NOTE — Progress Notes (Signed)
Patient ID: Connor [name], male   DOB: Oct 29, 1955, 65 y.o.   MRN: [id number]  Patient declined by [facility].  [place], [place] [phone]

## 2020-09-28 NOTE — Progress Notes (Signed)
Patient ID: Connor [name], male   DOB: [birth year], 65 y.o.   MRN: [id number]   Patient declined by [facility].  [place], [place] [phone]

## 2020-09-28 NOTE — Progress Notes (Signed)
Occupational Therapy Session Note  Patient Details  Name: Connor [name] MRN: [id number] Date of Birth: 05-Feb-1956  Today's Date: 09/28/2020 OT Individual Time: 1696-7893 OT Individual Time Calculation (min): 57 min    Short Term Goals: Week 1:  OT Short Term Goal 1 (Week 1): Pt will perform BSC/toilet transfers with CGA OT Short Term Goal 2 (Week 1): Pt will perform LB bathe/dress with CGA using AE PRN OT Short Term Goal 3 (Week 1): Pt will perform ADL/activity for 15+ minutes without rest break OT Short Term Goal 4 (Week 1): Pt will perform clothing management standing with CGA  Skilled Therapeutic Interventions/Progress Updates:    Pt completed supine to sit EOB with supervision.  He then worked on donning paper scrub pants with min assist sit to stand secondary to one LOB posteriorly when attempting to let go of the walker with the LUE to pull the pants up.  He then completed stand pivot transfer to the wheelchair with min guard assist stand pivot and rolled himself down past the first therapy gym with supervision.  Therapist then transported him the rest of the way in order to rest his UEs for use of the ergonometer.  He complete 15 mins on level 10 resistance Random Program Setting.  He maintained RPMs at 19-25 throughout changing direction at the 7.5 minute mark.  Next, had him complete tub transfer with use of the tub bench and RW at min guard assist.  Pt reports that he is supposed to be getting one for home.  Recommended that his cousin or brother also purchase a hand held shower and put it in if possible.  Returned to the room at end of session with pt transferring back to the bed at min guard stand pivot with use of the RW.  He transitioned to supine with call button and phone in reach and safety belt in place.     Therapy Documentation Precautions:  Precautions Precautions: Fall Precaution Comments: L AKA Restrictions Weight Bearing Restrictions: Yes LLE Weight Bearing: Non  weight bearing  Pain: Pain Assessment Pain Scale: 0-10 Pain Score: 5  Pain Type: Surgical pain Pain Location: Leg Pain Orientation: Left Pain Descriptors / Indicators: Discomfort Pain Onset: On-going ADL: See Care Tool Section for some details of mobility and selfcare  Therapy/Group: Individual Therapy  Hiba Garry OTR/L 09/28/2020, 3:45 PM

## 2020-09-28 NOTE — Discharge Summary (Signed)
Physician Discharge Summary  Patient ID: Jacquel Mccamish MRN: [id number] DOB/AGE: [birth year] 64 y.o.  Admit date: 09/19/2020 Discharge date: 10/01/2020  Discharge Diagnoses:  Principal Problem:   History of left above knee amputation [facility]) Active Problems:   Left above-knee amputee [facility]) DVT prophylaxis Pain management Severe normocytic anemia Atrial fibrillation AKI on CKD Hypertension Aortic dissection with repair Obesity  Discharged Condition: Stable  Significant Diagnostic Studies: US Abdomen Complete  Result Date: 09/01/2020 CLINICAL DATA:  Elevated liver enzymes. History of aortic aneurysm and dissection. EXAM: ABDOMEN ULTRASOUND COMPLETE COMPARISON:  None. FINDINGS: Gallbladder: Within the gallbladder, there are echogenic foci which move and shadow consistent with cholelithiasis. Largest gallstone measures 5 mm in length. There is no appreciable gallbladder wall thickening or pericholecystic fluid. No sonographic Murphy sign noted by sonographer. Common bile duct: Diameter: 8 mm proximally, prominent. There is slight intrahepatic biliary duct dilatation. No biliary duct obstructing lesion is evident. Note that portions of the mid to distal common bile duct are obscured by gas. Liver: No focal lesion identified. Liver echogenicity overall is increased. Portal vein is patent on color Doppler imaging with normal direction of blood flow towards the liver. IVC: No abnormality visualized in portions that can be interrogated. Note that portions of the inferior vena cava obscured by gas. Pancreas: Most of the pancreas is obscured by gas. Spleen: Size and appearance within normal limits. Right Kidney: Length: 12.1 cm. Echogenicity within normal limits. No mass or hydronephrosis visualized. Left Kidney: Length: 12.8 cm. Echogenicity within normal limits. No mass or hydronephrosis visualized. Abdominal aorta: Proximal abdominal aorta measures 5.0 cm in diameter, stable from prior CT examination.  Most of the remainder of the aorta is obscured by gas. The known dissection in this area is not appreciable on current sonographic evaluation. Other findings: No demonstrable ascites. IMPRESSION: 1. Cholelithiasis. No gallbladder wall thickening or pericholecystic fluid. 2. Prominence of the proximal common bile duct as well as mild intrahepatic biliary duct dilatation. No obstructing focus seen in the biliary ductal system. Note that portions of the mid to distal common bile duct obscured by gas. From an imaging standpoint, MRCP would be the optimum study of choice to further evaluate the biliary ductal system. 3. Proximal abdominal aortic aneurysm measuring approximately 5 cm in diameter, stable from prior CT examination. Most of the aorta is obscured by gas. The known dissection in this area of the aorta not appreciable by ultrasound. 4.  Most of pancreas and much of inferior vena cava obscured by gas. 5. Increase in liver echogenicity which may indicate hepatic steatosis with potential underlying parenchymal liver disease. No focal liver lesions are demonstrable on this study. Electronically Signed   By: Lowella Grip III M.D.   On: 09/01/2020 10:49   MR FEMUR LEFT WO CONTRAST  Result Date: 09/01/2020 CLINICAL DATA:  Left lower extremity pain and swelling. EXAM: MR OF THE LEFT FEMUR WITHOUT CONTRAST TECHNIQUE: Multiplanar, multisequence MR imaging of the left lower extremity was performed. No intravenous contrast was administered. COMPARISON:  CT scan 08/31/2020 FINDINGS: Both hips are normally located. Mild degenerative changes but no findings suspicious for septic arthritis or osteomyelitis. The left femur is intact. No evidence of osteomyelitis. Mild diffuse subcutaneous soft tissue swelling/edema/fluid suggesting cellulitis. Again demonstrated are enlarged and inflamed appearing left inguinal lymph nodes, likely a lymph adenitis. No discrete drainable subcutaneous soft tissue abscess is identified  although study somewhat limited without IV contrast. Changes of myofasciitis mainly involving the medial compartment/adductor compartment muscles. No findings suspicious for  pyomyositis. Similar findings involving the right upper thigh. Stable postoperative changes involving the left inguinal area with scar tissue. The major vascular structures demonstrate patent flow voids. Stable borderline enlarged left external iliac lymph node measuring 15.5 mm. No significant intrapelvic abnormalities are identified. IMPRESSION: 1. Mild diffuse subcutaneous soft tissue swelling/edema/fluid suggesting cellulitis. 2. Changes of myofasciitis mainly involving the medial compartment/adductor compartment muscles. No evidence of pyomyositis. 3. No findings suspicious for septic arthritis or osteomyelitis. 4. Stable enlarged and inflamed appearing left inguinal and left external iliac lymph nodes. Electronically Signed   By: Marijo Sanes M.D.   On: 09/01/2020 09:38   ECHOCARDIOGRAM COMPLETE  Result Date: 09/01/2020    ECHOCARDIOGRAM REPORT   Patient Name:   OSAZE HUBBERT Date of Exam: 09/01/2020 Medical Rec #:  [id number]     Height:       71.0 in Accession #:    [id number]    Weight:       253.1 lb Date of Birth:  1956/07/25    BSA:          2.330 m Patient Age:    65 years      BP:           93/60 mmHg Patient Gender: M             HR:           83 bpm. Exam Location:  [facility] Procedure: 2D Echo, Cardiac Doppler and Color Doppler Indications:    Bacteremia R78.81  History:        Patient has prior history of Echocardiogram examinations, most                 recent 10/02/2019. Arrythmias:Atrial Fibrillation; Risk                 Factors:Hypertension. History of open heart surgery (From Hx),                 History of aortic dissection s/p repair in 2016.  Sonographer:    Alvino Chapel RCS Referring Phys: [id number] [place]  1. Left ventricular ejection fraction, by estimation, is 60 to 65%. The left  ventricle has normal function. The left ventricle has no regional wall motion abnormalities. There is severe left ventricular hypertrophy. Left ventricular diastolic parameters  are consistent with Grade II diastolic dysfunction (pseudonormalization).  2. Right ventricular systolic function is normal. The right ventricular size is normal. There is normal pulmonary artery systolic pressure. The estimated right ventricular systolic pressure is 20.9 mmHg.  3. Left atrial size was severely dilated.  4. Right atrial size was moderately dilated.  5. The mitral valve is grossly normal. Trivial mitral valve regurgitation.  6. The aortic valve is tricuspid. Aortic valve regurgitation is not visualized.  7. The inferior vena cava is normal in size with greater than 50% respiratory variability, suggesting right atrial pressure of 3 mmHg. FINDINGS  Left Ventricle: Left ventricular ejection fraction, by estimation, is 60 to 65%. The left ventricle has normal function. The left ventricle has no regional wall motion abnormalities. The left ventricular internal cavity size was normal in size. There is  severe left ventricular hypertrophy. Left ventricular diastolic parameters are consistent with Grade II diastolic dysfunction (pseudonormalization). Right Ventricle: The right ventricular size is normal. No increase in right ventricular wall thickness. Right ventricular systolic function is normal. There is normal pulmonary artery systolic pressure. The tricuspid regurgitant velocity is 2.58 m/s, and  with an assumed right  atrial pressure of 3 mmHg, the estimated right ventricular systolic pressure is 54.6 mmHg. Left Atrium: Left atrial size was severely dilated. Right Atrium: Right atrial size was moderately dilated. Pericardium: There is no evidence of pericardial effusion. Mitral Valve: The mitral valve is grossly normal. Trivial mitral valve regurgitation. Tricuspid Valve: The tricuspid valve is grossly normal. Tricuspid valve  regurgitation is trivial. Aortic Valve: The aortic valve is tricuspid. Aortic valve regurgitation is not visualized. Pulmonic Valve: The pulmonic valve was grossly normal. Pulmonic valve regurgitation is trivial. Aorta: The aortic root is normal in size and structure. Venous: The inferior vena cava is normal in size with greater than 50% respiratory variability, suggesting right atrial pressure of 3 mmHg. IAS/Shunts: No atrial level shunt detected by color flow Doppler.  LEFT VENTRICLE PLAX 2D LVIDd:         4.60 cm  Diastology LVIDs:         2.90 cm  LV e' medial:    7.94 cm/s LV PW:         1.70 cm  LV E/e' medial:  17.9 LV IVS:        1.80 cm  LV e' lateral:   12.20 cm/s LVOT diam:     2.00 cm  LV E/e' lateral: 11.6 LV SV:         95 LV SV Index:   41 LVOT Area:     3.14 cm  RIGHT VENTRICLE RV S prime:     15.00 cm/s TAPSE (M-mode): 2.2 cm LEFT ATRIUM              Index       RIGHT ATRIUM           Index LA diam:        4.70 cm  2.02 cm/m  RA Area:     28.20 cm LA Vol (A2C):   100.0 ml 42.91 ml/m RA Volume:   95.10 ml  40.81 ml/m LA Vol (A4C):   127.0 ml 54.50 ml/m LA Biplane Vol: 115.0 ml 49.35 ml/m  AORTIC VALVE LVOT Vmax:   144.00 cm/s LVOT Vmean:  106.000 cm/s LVOT VTI:    0.301 m  AORTA Ao Root diam: 3.30 cm MITRAL VALVE                TRICUSPID VALVE MV Area (PHT): 3.05 cm     TR Peak grad:   26.6 mmHg MV Decel Time: 249 msec     TR Vmax:        258.00 cm/s MV E velocity: 142.00 cm/s MV A velocity: 50.90 cm/s   SHUNTS MV E/A ratio:  2.79         Systemic VTI:  0.30 m                             Systemic Diam: 2.00 cm Rozann Lesches MD Electronically signed by Rozann Lesches MD Signature Date/Time: 09/01/2020/4:23:11 PM    Final    ECHO TEE  Result Date: 09/05/2020    TRANSESOPHOGEAL ECHO REPORT   Patient Name:   HASKEL DEWALT Date of Exam: 09/05/2020 Medical Rec #:  [id number]     Height:       71.0 in Accession #:    [id number]    Weight:       247.8 lb Date of Birth:  [birth year]    BSA:           2.310 m  Patient Age:    64 years      BP:           134/61 mmHg Patient Gender: M             HR:           82 bpm. Exam Location:  [facility] Procedure: Transesophageal Echo, Cardiac Doppler and Color Doppler Indications:     Bacteremia  History:         Patient has prior history of Echocardiogram examinations, most                  recent 09/01/2020. Cardiomyopathy, Arrythmias:Atrial                  Fibrillation, Signs/Symptoms:Fever and Bacteremia; Risk                  Factors:Hypertension. Aortic dissection repair.  Sonographer:     Dustin Flock Referring Phys:  [id number] Erma Heritage Diagnosing Phys: Gwyndolyn Kaufman MD PROCEDURE: The transesophogeal probe was passed without difficulty through the esophogus of the patient. Sedation performed by different physician. The patient was monitored while under deep sedation. Anesthestetic sedation was provided intravenously by Anesthesiology: 346.21m of Propofol, 569mof Lidocaine. The patient developed no complications during the procedure. IMPRESSIONS  1. Left ventricular ejection fraction, by estimation, is 60 to 65%. The left ventricle has normal function.  2. Right ventricular systolic function is normal. The right ventricular size is normal. Pulmonary artery systolic pressure is [id number]+AP.  3. Left atrial size was severely dilated. No left atrial/left atrial appendage thrombus was detected.  4. The mitral valve is normal in structure. Trivial mitral valve regurgitation.  5. The aortic valve is tricuspid. Aortic valve regurgitation is trivial. No aortic stenosis is present.  6. Agitated saline contrast bubble study was positive with shunting observed after >6 cardiac cycles suggestive of intrapulmonary shunting.  7. Right atrial size was severely dilated.  8. No valvular vegetations visualized. FINDINGS  Left Ventricle: Left ventricular ejection fraction, by estimation, is 60 to 65%. The left ventricle has normal function. The left ventricular  internal cavity size was normal in size. Right Ventricle: The right ventricular size is normal. No increase in right ventricular wall thickness. Right ventricular systolic function is normal. Pulmonary artery systolic pressure is [id number]+GXQLeft Atrium: Left atrial size was severely dilated. No left atrial/left atrial appendage thrombus was detected. Right Atrium: Right atrial size was severely dilated. Pericardium: There is no evidence of pericardial effusion. Mitral Valve: The mitral valve is normal in structure. There is mild thickening of the mitral valve leaflet(s). Trivial mitral valve regurgitation. There is no evidence of mitral valve vegetation. Tricuspid Valve: The tricuspid valve is normal in structure. Tricuspid valve regurgitation is mild. There is no evidence of tricuspid valve vegetation. Aortic Valve: The aortic valve is tricuspid. Aortic valve regurgitation is trivial. No aortic stenosis is present. There is no evidence of aortic valve vegetation. Pulmonic Valve: The pulmonic valve was normal in structure. Pulmonic valve regurgitation is trivial. There is no evidence of pulmonic valve vegetation. Aorta: The aortic root and ascending aorta are structurally normal, with no evidence of dilitation. IAS/Shunts: No atrial level shunt detected by color flow Doppler. Agitated saline contrast bubble study was positive with shunting observed after >6 cardiac cycles suggestive of intrapulmonary shunting.  TRICUSPID VALVE TR Peak grad:   34.1 mmHg TR Vmax:        292.00 cm/s HeGwyndolyn KaufmanD Electronically signed by  Gwyndolyn Kaufman MD Signature Date/Time: 09/05/2020/3:50:11 PM    Final    CT EXTREMITY LOWER LEFT WO CONTRAST  Result Date: 09/17/2020 CLINICAL DATA:  Sepsis.  Soft tissue infection suspected EXAM: CT OF THE LOWER LEFT EXTREMITY WITHOUT CONTRAST CT OF THE LOWER RIGHT EXTREMITY WITHOUT CONTRAST TECHNIQUE: Multidetector CT imaging of the lower left extremity and lower right extremity was  performed according to the standard protocol. 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 No erosion or periostitis at the resection margin to suggest osteomyelitis. No acute fracture or malalignment. No evidence of femoral head avascular necrosis. Ligaments Suboptimally assessed by CT. Muscles and Tendons Postsurgical changes to the left thigh musculature. No acute tendinous injury. Soft tissues There is an ill-defined mixed density fluid collection within the soft tissues at the distal stump laterally. There are mixed areas of high attenuation fluid with areas of more decreased attenuation as well as multiple locules of soft tissue gas. Approximate measurements of the collection are 10.3 cm AP x 5.3 cm TR x 12.4 cm CC (series 6, image 266; series 9, image 103). Collection approximates the surgical incision site as evident by overlying skin staples. No foci of gas within the soft tissues tracking beyond the site of fluid collection. Subcutaneous edema within the left thigh most pronounced anteriorly at the level of the hip. No deep fascial fluid collection. Multiple mildly enlarged left inguinal lymph nodes are again seen. Subcutaneous edema throughout the right lower extremity most pronounced along the lateral aspect of the knee and calf. No organized fluid collection. No soft tissue gas within the right lower extremity. No deep fascial fluid collections are evident. For findings within the pelvis. Please see concurrently obtained dedicated CT of the chest, abdomen, and pelvis. IMPRESSION: 1. Ill-defined mixed density fluid collection within the soft tissues at the distal stump laterally measuring approximately 12.4 x 5.3 x 10.3 cm. Findings are suggestive of hematoma. There are multiple foci of air within the collection, which may be related to recent surgery. Superimposed infected collection not excluded. 2. No evidence of  osteomyelitis by CT. 3. Nonspecific subcutaneous edema within the left thigh and throughout the right lower extremity, which may reflect cellulitis. No additional organized fluid collection. 4. Multiple mildly enlarged left inguinal lymph nodes, likely reactive. 5. For findings within the pelvis. Please see concurrently obtained dedicated CT of the chest, abdomen, and pelvis. Electronically Signed   By: Davina Poke D.O.   On: 09/17/2020 13:26   CT EXTREMITY LOWER LEFT WO CONTRAST  Result Date: 08/31/2020 CLINICAL DATA:  Edema and swelling over left lower extremity EXAM: CT OF THE LOWER LEFT EXTREMITY WITHOUT CONTRAST TECHNIQUE: Multidetector CT imaging of the lower left extremity was performed according to the standard protocol. COMPARISON:  Ultrasound 08/30/2020, CT angiography 03/01/2020, 06/19/2019 FINDINGS: Bones/Joint/Cartilage No fracture or malalignment. No periostitis or bony destructive change. No significant hip or knee effusion is visualized. Ligaments Suboptimally assessed by CT. Muscles and Tendons No significant atrophy.  No intramuscular fluid collections. Soft tissues Mild vascular calcifications. Focal distortion within the left groin with surgical clips, unchanged and presumably due to surgical scarring. Multiple enlarged left inguinal lymph nodes, the largest measures 3.01 2.7 by 2.5 cm and corresponds to the ultrasound demonstrated abnormal lymph node. Moderate subcutaneous edema and fluid within the left lower extremity extending from the hip to the imaged proximal lower leg. Edema most heavily concentrated at the distal thigh and knee. Skin thickening  and edematous infiltration of subcutaneous fat of the anterior, medial, and lateral thigh with small amount of fluid superficial to the lateral quadriceps muscles. No gas containing fluid collections. IMPRESSION: 1. Moderate subcutaneous edema within the left lower extremity extending from the hip to the imaged proximal lower leg, possible  cellulitis or nonspecific lower extremity edema. No gas containing fluid collections are seen to suggest soft tissue abscess. Note that assessment for abscess is limited without contrast. There is no acute osseous abnormality. 2. Multiple enlarged left inguinal lymph nodes, the largest measuring up to 3 cm, corresponding to the ultrasound demonstrated abnormal lymph node on recent ultrasound. Findings are indeterminate for reactive adenopathy or malignant adenopathy. Tissue sampling was previously suggested. 3. No acute osseous abnormality. Electronically Signed   By: Donavan Foil M.D.   On: 08/31/2020 21:18   CT EXTREMITY LOWER RIGHT WO CONTRAST  Result Date: 09/17/2020 CLINICAL DATA:  Sepsis.  Soft tissue infection suspected EXAM: CT OF THE LOWER LEFT EXTREMITY WITHOUT CONTRAST CT OF THE LOWER RIGHT EXTREMITY WITHOUT CONTRAST TECHNIQUE: Multidetector CT imaging of the lower left extremity and lower right extremity was performed according to the standard protocol. 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 No erosion or periostitis at the resection margin to suggest osteomyelitis. No acute fracture or malalignment. No evidence of femoral head avascular necrosis. Ligaments Suboptimally assessed by CT. Muscles and Tendons Postsurgical changes to the left thigh musculature. No acute tendinous injury. Soft tissues There is an ill-defined mixed density fluid collection within the soft tissues at the distal stump laterally. There are mixed areas of high attenuation fluid with areas of more decreased attenuation as well as multiple locules of soft tissue gas. Approximate measurements of the collection are 10.3 cm AP x 5.3 cm TR x 12.4 cm CC (series 6, image 266; series 9, image 103). Collection approximates the surgical incision site as evident by overlying skin staples. No foci of gas within the soft tissues tracking beyond  the site of fluid collection. Subcutaneous edema within the left thigh most pronounced anteriorly at the level of the hip. No deep fascial fluid collection. Multiple mildly enlarged left inguinal lymph nodes are again seen. Subcutaneous edema throughout the right lower extremity most pronounced along the lateral aspect of the knee and calf. No organized fluid collection. No soft tissue gas within the right lower extremity. No deep fascial fluid collections are evident. For findings within the pelvis. Please see concurrently obtained dedicated CT of the chest, abdomen, and pelvis. IMPRESSION: 1. Ill-defined mixed density fluid collection within the soft tissues at the distal stump laterally measuring approximately 12.4 x 5.3 x 10.3 cm. Findings are suggestive of hematoma. There are multiple foci of air within the collection, which may be related to recent surgery. Superimposed infected collection not excluded. 2. No evidence of osteomyelitis by CT. 3. Nonspecific subcutaneous edema within the left thigh and throughout the right lower extremity, which may reflect cellulitis. No additional organized fluid collection. 4. Multiple mildly enlarged left inguinal lymph nodes, likely reactive. 5. For findings within the pelvis. Please see concurrently obtained dedicated CT of the chest, abdomen, and pelvis. Electronically Signed   By: Davina Poke D.O.   On: 09/17/2020 13:26   CT CHEST ABDOMEN PELVIS WO CONTRAST  Result Date: 09/17/2020 CLINICAL DATA:  Suspected retroperitoneal hematoma . Anemia. Severe sepsis and acute renal failure. Necrotizing fasciitis. 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 EXAM:  CT CHEST, ABDOMEN AND PELVIS WITHOUT CONTRAST TECHNIQUE: Multidetector CT imaging of the chest, abdomen and pelvis was performed following the standard protocol without IV contrast. COMPARISON:  09/01/2020 abdominal ultrasound. 03/01/2020 CTA of the chest, abdomen, and pelvis. FINDINGS: CT CHEST FINDINGS  Cardiovascular: Mild motion degradation. Status post ascending aortic repair and transverse aortic stent graft. The descending thoracic aorta measures on the order of 5.3 cm on 39/3, similar to on the prior. The dissection is not well evaluated on this noncontrast exam. Tortuous descending thoracic aorta. Moderate cardiomegaly, without pericardial effusion. Lad coronary artery calcification. Pulmonary artery enlargement, outflow tract 3.5 cm. Mediastinum/Nodes: Right chest wall surgical clips. Right paratracheal node of 1.7 cm on 27/3, similar. Hilar regions poorly evaluated without intravenous contrast. Lungs/Pleura: Trace bilateral pleural fluid, new since prior CT. Left base scarring or subsegmental atelectasis. Posterior left upper lobe dependent atelectasis as well. Musculoskeletal: Marked bilateral gynecomastia. Prior median sternotomy. No acute osseous abnormality. CT ABDOMEN PELVIS FINDINGS Hepatobiliary: Hepatomegaly at 21.4 cm craniocaudal. Dependent gallstones. No acute cholecystitis or biliary duct dilatation. Pancreas: Normal, without mass or ductal dilatation. Spleen: Normal in size, without focal abnormality. Adrenals/Urinary Tract: Normal adrenal glands. No renal calculi or hydronephrosis. No hydroureter or ureteric calculi. No bladder calculi. Stomach/Bowel: Normal stomach, without wall thickening. Colonic stool burden suggests constipation. Normal terminal ileum and appendix. Normal small bowel. Vascular/Lymphatic: Left renal artery stent. Left common and external iliac artery stents. No pelvic or abdominal retroperitoneal hemorrhage/hematoma. No abdominopelvic adenopathy. Reproductive: Normal prostate. Other: No significant free fluid. No free intraperitoneal air. Extensive subcutaneous edema throughout the proximal left lower extremity. Intramuscular edema and fluid are incompletely imaged, including on 136/3. Musculoskeletal: No acute osseous abnormality. IMPRESSION: 1. Subcutaneous and  intramuscular edema/fluid throughout the imaged proximal left lower extremity. This will be more completely evaluated on dedicated extremity CT, dictated separately. 2. No pelvic or abdominal retroperitoneal hematoma identified. 3. Similar appearance of the thoracic aorta, status post ascending aortic repair and transverse aortic stent graft. 4. Aortic atherosclerosis (ICD10-I70.0) and emphysema (ICD10-J43.9). 5. Tiny bilateral pleural effusions. 6. Cholelithiasis. 7.  Possible constipation. 8. Pulmonary artery enlargement suggests pulmonary arterial hypertension. 9. Mild thoracic adenopathy, favored to be reactive. 10. Bilateral marked gynecomastia. Electronically Signed   By: Abigail Miyamoto M.D.   On: 09/17/2020 13:26    Labs:  Basic Metabolic Panel: Recent Labs  Lab 09/23/20 0809 09/27/20 0444  NA 137 139  K 3.6 4.2  CL 102 103  CO2 25 27  GLUCOSE 111* 96  BUN 15 15  CREATININE 1.28* 1.32*  CALCIUM 8.5* 8.6*    CBC: Recent Labs  Lab 09/23/20 0809 09/27/20 0444  WBC 7.4 6.9  NEUTROABS 5.5 4.2  HGB 7.9* 8.2*  HCT 26.2* 26.3*  MCV 96.7 95.3  PLT 227 310    CBG: No results for input(s): GLUCAP in the last 168 hours.  Family history.  Mother with CAD Father with CVA.  Denies any colon cancer esophageal cancer or rectal cancer  Brief HPI:   Jayston Trevino is a 65 y.o. right-handed male with history of aortic dissection with several complicated surgeries including redo thoracic aortic repair as well as sternal infection, atrial fibrillation on Eliquis, CAD with CABG, CKD stage II-3 hypertension and obesity.  Per chart review lives with autistic cousin in a mobile home with ramped entrance.  Independent with assistive device.  Community ambulator with straight point cane and driving.  Presented 08/30/2020 with persistent nausea vomiting diarrhea x5 days.  In the ED noted fever  102.7 he was tachycardic.  Chemistry showed a potassium 2.8 glucose 162 BUN 55 creatinine 4.21 from baseline 1.38,  WBC 15,600 hemoglobin 10.5 platelets 136,000 lactic acid 2.1 troponin 425-482, blood culture GPC x2 Staphylococcus urine culture E Faecalis, CK 28,000.  Chest x-ray showed no acute intrathoracic process.  Patient noted edema and swelling of left lower extremity.  CT of left lower extremity showed no fracture or malalignment.  No significant hip or knee effusion however there was moderate subcutaneous edema within the left lower extremity extending from the hip to the imaged proximal lower leg possibly representing cellulitis or nonspecific lower extremity edema.  No gas containing fluid collections.  Multiple enlarged left inguinal lymph nodes largest measuring up to 3 mm.  Nephrology consulted for AKI on CKD with rhabdomyolysis and felt AKI likely secondary to acute tubular injury decreased renal perfusion related to hypotension.  Placed on gentle IV fluids and renal function much improved latest creatinine 1.45 and latest CK 202.  Patient had been maintained on broad-spectrum antibiotics for suspected sepsis/cellulitis.  Orthopedic service follow-up Dr. 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  Hospital course complicated by anemia requiring transfusion.  His chronic anticoagulation was resumed.  Hematology services Dr. Lindi Adie consulted in regards to anemia felt to be multifactorial and work-up with iron studies and reticulocyte count with latest hemoglobin 7.4 platelet 217,000.  No current indication for bone marrow biopsy.  Therapy evaluations completed due to patient's decreased functional mobility multimedical issues was admitted for a comprehensive rehab program.   Hospital Course: Garlen Reinig was admitted to rehab 09/19/2020 for inpatient therapies to consist of PT, ST and OT at least three hours five days a week. Past admission physiatrist, therapy team and rehab RN have worked together  to provide customized collaborative inpatient rehab.  Pertaining to patient's left AKI 09/09/2020 secondary to necrotizing fasciitis.  Wound VAC has been discontinued close monitoring of wound per orthopedic services.  Recommendations for follow-up outpatient.  He was still having a bit of drainage from the posterior aspect.  Patient's chronic Eliquis has been resumed monitoring for any bleeding episodes.  Pain management with the use of Voltaren gel, Neurontin 400 mg 3 times daily, Robaxin as needed.  Scheduled OxyContin 30 mg every 12 hours with discussion on taper as well as oxycodone for breakthrough pain.  He had been on scheduled tramadol.  Severe normocytic anemia plans to follow-up hematology services latest hemoglobin 8.2.  Blood pressure monitored as well as history of atrial fibrillation/aortic dissection with repair cardiology service follow-up continued on Cardizem as directed as well as hydralazine and Aldactone.  AKI on CKD rhabdomyolysis latest creatinine 1.32.  Obesity with dietary follow-up.   Blood pressures were monitored on TID basis and soft and monitored     Rehab course: During patient's stay in rehab weekly team conferences were held to monitor patient's progress, set goals and discuss barriers to discharge. At admission, patient required minimal assist sit to stand minimal assist sit to side-lying minimal assist supine to sit.  Max assist upper body bathing total assist lower body bathing mod assist lower body dressing total assist lower body dressing  Physical exam.  Blood pressure 152/83 pulse 61 temperature 98.7 respirations 18 oxygen saturation 94% room air Constitutional.  Patient a bit anxious HEENT Head.  Normocephalic and atraumatic Eyes.  Pupils round and reactive to light no discharge without nystagmus Neck.  Supple nontender no JVD without thyromegaly Cardiac regular rate rhythm not any extra sounds or murmur heard Abdomen.  Soft obese positive bowel sounds  without rebound Skin.  Left AKA dressed there was some drainage from the posterior aspect of incision without odor. Neurologic alert oriented follows full commands.  Cranial nerves II through XII intact motor strength 5 out of 5 bilateral deltoid bicep tricep grip.  Right hip flexor knee extensor ankle dorsi plantarflexion, 3 - left hip flexor   He/She  has had improvement in activity tolerance, balance, postural control as well as ability to compensate for deficits. He/She has had improvement in functional use RUE/LUE  and RLE/LLE as well as improvement in awareness.  Supine to sit from flat bed contact-guard assist.  Ambulates 20 feet contact-guard with assistive device.  It was discussed with patient the need to complete stair training as he did not have a ramp patient reported he doesn't feel he needed to do stairs as he was able to get it done before and had family assistance.  Stand pivot to bed from bed to wheelchair contact-guard assist.  Propels his wheelchair contact-guard assist.  Completes upper body and core strengthening exercises contact-guard.  Shower completed seated on shower bench contact-guard.  Full teaching completed and discharged to home       Disposition: Discharged to home    Diet: Carb modified  Special Instructions: No driving smoking or alcohol  Dry dressing-nonadherent dressing with Curlex over it twice daily and as needed to the AKA site if saturated.  May cleanse stump with antibacterial soap and water and place Eucerin on flaky areas then apply shrinker with belt.  Medications at discharge 1.  Eliquis 5 mg p.o. twice daily 2.  Voltaren 2 g 4 times daily 3.  Cardizem CD 240 mg p.o. daily 4.  Neurontin 400 mg p.o. 3 times daily 5.  Hydralazine 25 mg every 8 hours 6.  Labetalol 400 mg p.o. twice daily 7.  Robaxin 1000 mg every 6 hours as needed muscle spasms 8.  Oxycodone 5-10 mg every 4 hours as needed pain 9.  OxyContin 20 mg every 12 hours x1 week and  stop 10.  MiraLAX daily hold for loose stools 11.  Aldactone 25 mg daily  30-35 minutes were spent completing discharge summary and discharge planning  Discharge Instructions    Ambulatory referral to Physical Medicine Rehab   Complete by: As directed    Moderate complexity follow-up 1 to 2 weeks left AKA       Follow-up Information    Lovorn, Jinny Blossom, MD Follow up.   Specialty: Physical Medicine and Rehabilitation Why: office to call for appointment Contact information: [address] [place] [address] [phone]        Newt Minion, MD Follow up.   Specialty: Orthopedic Surgery Why: call for appointment Contact information: [place] [address] [phone]        Nicholas Lose, MD Follow up.   Specialty: Hematology and Oncology Why: as needed Contact information: [place] [address] [phone]        Donato Heinz, MD Follow up.   Specialties: Cardiology, Radiology Why: as needed Contact information: [address] [facility] [place] [address] [phone]        Gean Quint, MD Follow up.   Specialty: Nephrology Why: as needed Contact information: [address] [place] [address] [phone]  Signed: Lavon Paganini Angiulli 09/30/2020, 5:23 AM

## 2020-09-28 NOTE — Progress Notes (Signed)
Patient ID: Connor [name], male   DOB: [birth year], 65 y.o.   MRN: [id number]   Rolling Walker Ordered through [facility].   [place], [place] [phone]

## 2020-09-28 NOTE — Progress Notes (Signed)
Patient ID: Connor [name], male   DOB: 04/29/1956, 65 y.o.   MRN: [id number]   Patient referral sent to [facility].  [place], [place] [phone]

## 2020-09-28 NOTE — Consult Note (Signed)
Neuropsychological Consultation   Patient:   Connor [name]   DOB:   04/13/1956  MR Number:  [id number]  Location:  [facility] [place] [facility] [address] CENTER A [address] [id number] [place] [address] Dept: [phone] Loc: [phone]           Date of Service:   09/28/2020  Start Time:   3 PM End Time:   3 PM  Provider/Observer:  Arley Phenix, Psy.D.       Clinical Neuropsychologist       Billing Code/Service: 96158/[id number]  Chief Complaint:    Connor [name] is a 65 year old male with a history of aortic dissection with several follow-up complicated surgeries including redo thoracic aortic repair as well as sternal infection, A. fib, CAD with CABG, chronic kidney disease stage II, hypertension and obesity.  Patient presented on 08/30/2020 with persistent nausea/vomiting and diarrhea over the prior 5 days.  Patient with fever of 102.7.  Work-up indicated possibly cellulitis or nonspecific lower extremity edema.  Patient placed on broad-spectrum antibiotics for suspected sepsis/cellulitis.  Followed up by Dr. Lajoyce Corners for necrotizing fasciitis left leg wound which was not felt to be salvageable and patient underwent left AKA on 09/08/2019 with revision on 09/09/2020 and placement of wound VAC.  Hospital course included multiple transfusions for anemia.  Patient referred for CIR due to decreased functional ability and multiple medical issues.  Today's visit was a follow-up visit with the patient.  The patient has continued to improve and was more alert and oriented with better expressive language with positive mood state and looking forward to discharge on 09/30/2020  Reason for Service:  Patient was referred for neuropsychological consultation due to coping and adjustment issues following left AKA.  Below is the HPI for the current admission.  [id number] Connor [name] is a 64 year old right-handed male with history of aortic dissection with several  complicated surgery including redo thoracic aortic repair as well as sternal infection, atrial fibrillation on Eliquis, CAD with CABG, CKD stage II-3, hypertension, obesity. Per chart review patient lives with his autistic cousinin a mobile home ramped entrance. Independent with assistive device. Community ambulator with straight point cane and driving. . Presented 08/30/2020 with persistent nausea vomiting diarrhea x5 days. In the ED noted fever 102.7 he was tachycardic. Chemistry showed a potassium 2.8 glucose 162 BUN 55 creatinine 4.21 from baseline of 1.38, WBC 15,600, hemoglobin 10.5, platelets 136,000, lactic acid 2.1 troponin 425-482, blood cultures GPC x2 Streptococcus urine cultureE Faecalis, CK 28,083.Marland Kitchen Chest x-ray showed no acute intrathoracic process. Patient noted edema and swelling of the left lower extremity. CT of left lower extremity showed no fracture or malalignment. No significant hip or knee effusion however there was moderate subcutaneous edema within the left lower extremity extending from the hip to the imaged proximal lower leg possibly representing cellulitis or nonspecific lower extremity edema. No gas containing fluid collections. Multiple enlarged left inguinal lymph nodes the largest measuring up to 3 cm. Nephrology consulted for AKI on CKD with rhabdomyolysis and felt AKI likely secondary to acute tubular injury decreased renal perfusion related to hypotension. Placed on gentle IV fluids and renal functions much improved with latest creatinine 1.45 and latest CK 202.Marland Kitchen Patient had been maintained on broad-spectrum antibiotics for suspected sepsis/cellulitis. Orthopedic service follow-up Dr. 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 Hospital course patient required multiple transfusions for anemia. His chronic  anticoagulation was resumed. Hematology  service Dr. Pamelia Hoit consulted in regards to anemia felt to be multifactorial and work-up with iron studies and reticulocyte count with latest hemoglobin 7.4, platelets 217,000. No current indications for bone marrow biopsies. Therapy evaluations completed due to patient's decreased functional ability/multimedical he was admitted for a comprehensive rehab program  Current Status:  Upon entering the room, the patient was in his bed in a slightly elevated position.  He was alert and oriented with good cognition/mental status.  Patient reported improved mood and feeling positive about therapeutic interventions and feels like he is getting stronger.  He had some concern about small blood discharge at wound site but other than that he was feeling very positive about his improvement.  Looking forward to gaining strength and continuing to progress forward to allow for prostatic fitting and use in the future.  Behavioral Observation: Connor [name]  presents as a 65 y.o.-year-old Right African American Male who appeared his stated age. his dress was Appropriate and he was Well Groomed and his manners were Appropriate to the situation.  his participation was indicative of Appropriate and Redirectable behaviors.  There were physical disabilities noted.  he displayed an appropriate level of cooperation and motivation.     Interactions:    Active Appropriate and Attentive  Attention:   within normal limits  Memory:   within normal limits; recent and remote memory intact  Visuo-spatial:  not examined  Speech (Volume):  normal  Speech:   normal; slurred  Thought Process:  Coherent and Relevant  Though Content:  WNL; not suicidal and not homicidal  Orientation:   person, place, time/date and situation  Judgment:   Good  Planning:   Fair  Affect:    Appropriate  Mood:    Euthymic  Insight:   Good and Fair  Intelligence:   normal  Medical History:   Past Medical History:  Diagnosis Date  . AAA  (abdominal aortic aneurysm) (HCC)   . History of open heart surgery   . Hypertension   . Seroma due to trauma [facility])          Patient Active Problem List   Diagnosis Date Noted  . Severe anemia 09/19/2020  . History of left above knee amputation (HCC) 09/19/2020  . Left above-knee amputee (HCC) 09/19/2020  . 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 CKD (chronic kidney disease), stage III (HCC) 09/01/2020  . Morbid obesity (HCC) 09/01/2020  . Severe sepsis with acute organ dysfunction due to Streptococcus species (HCC) 08/30/2020  . Cellulitis of left leg 08/30/2020  . Lactic acidosis 08/30/2020  . Elevated troponin 08/30/2020  . Hypoalbuminemia 08/30/2020  . Leukocytosis 08/30/2020  . Thrombocytopenia (HCC) 08/30/2020  . Hypokalemia 08/30/2020  . Hyperglycemia 08/30/2020  . Transaminitis 08/30/2020  . Total bilirubin, elevated 08/30/2020  . AKI (acute kidney injury) (HCC) 08/30/2020  . Nausea vomiting and diarrhea 08/30/2020  . Hypertension 05/20/2020  . Persistent atrial fibrillation (HCC)       Psychiatric History:  No prior psychiatric history  Family Med/Psych History:  Family History  Problem Relation Age of Onset  . CAD Mother   . CVA Father     Impression/DX:  Connor [name] is a 65 year old male with a history of aortic dissection with several follow-up complicated surgeries including redo thoracic aortic repair as well as sternal infection, A. fib, CAD with CABG, chronic kidney disease stage II, hypertension and obesity.  Patient presented on 08/30/2020 with persistent nausea/vomiting and diarrhea  over the prior 5 days.  Patient with fever of 102.7.  Work-up indicated possibly cellulitis or nonspecific lower extremity edema.  Patient placed on broad-spectrum antibiotics for suspected sepsis/cellulitis.  Followed up by Dr. Lajoyce Corners for necrotizing fasciitis left leg wound which was not felt to be salvageable and patient underwent left AKA on 09/08/2019  with revision on 09/09/2020 and placement of wound VAC.  Hospital course included multiple transfusions for anemia.  Patient referred for CIR due to decreased functional ability and multiple medical issues.  Today's visit was a follow-up visit with the patient.  The patient has continued to improve and was more alert and oriented with better expressive language with positive mood state and looking forward to discharge on 09/30/2020  Upon entering the room, the patient was in his bed in a slightly elevated position.  He was alert and oriented with good cognition/mental status.  Patient reported improved mood and feeling positive about therapeutic interventions and feels like he is getting stronger.  He had some concern about small blood discharge at wound site but other than that he was feeling very positive about his improvement.  Looking forward to gaining strength and continuing to progress forward to allow for prostatic fitting and use in the future.   Disposition/Plan:  Patient doing well with much better coping and adjustment to recent amputation and looking forward to future prosthetic fitting.  Patient to be discharged on 09/30/2020.  Diagnosis:    L AKA         Electronically Signed   _______________________ Arley Phenix, Psy.D. Clinical Neuropsychologist

## 2020-09-28 NOTE — Progress Notes (Signed)
[facility] PHYSICAL MEDICINE & REHABILITATION PROGRESS NOTE   Subjective/Complaints:  Pt reports LBM this AM- just now.  Concerned that still having drainage- explained it's most likely from hematoma that's draining, not active bleeding.  Also notes only getting dressing changed 1x yesterday- not 2x.  CGA to toilet and SBA bed mobility seen today.   ROS:   Pt denies SOB, abd pain, CP, N/V/C/D, and vision changes   Objective:   No results found. Recent Labs    09/27/20 0444  WBC 6.9  HGB 8.2*  HCT 26.3*  PLT 310   Recent Labs    09/27/20 0444  NA 139  K 4.2  CL 103  CO2 27  GLUCOSE 96  BUN 15  CREATININE 1.32*  CALCIUM 8.6*    Intake/Output Summary (Last 24 hours) at 09/28/2020 0838 Last data filed at 09/28/2020 0432 Gross per 24 hour  Intake 120 ml  Output 875 ml  Net -755 ml        Physical Exam: Vital Signs Blood pressure (!) 155/78, pulse 60, temperature 97.9 F (36.6 C), resp. rate 18, height 5\' 11"  (1.803 m), weight 113.2 kg, SpO2 96 %.  Physical Exam   Transferred BSC to bed, CGA, with RW; OT in room, appropriate, NAD Mood and affect are still flat, but appropriate otherwise Heart: RRR Lungs: CTA B/L- no W/R/R- good air movement Abdomen: Soft, NT, ND, (+)BS  Extremities: No clubbing, cyanosis, or edema Skin: No evidence of breakdown, no evidence of rash   L AKA Skin:  LL AKA_ vertical incision- moderate clotted drainage on dressing- draining from 1 spot on posterior incision- doesn't drain with palpation- no erythema seen- flatter/less swelling around incision  Neurologic: Ox3  Cranial nerves II through XII intact, motor strength is 5/5 in bilateral deltoid, bicep, tricep, grip, Right hip flexor, knee extensors, ankle dorsiflexor and plantar flexor, 3 - left hip flexor,  Sensory exam normal sensation to light touch  in bilateral upper and lower extremities Musculoskeletal: L AKA- as above UE's 5/5 B/L; RLE-  5/5      Assessment/Plan: 1. Functional deficits which require 3+ hours per day of interdisciplinary therapy in a comprehensive inpatient rehab setting.  Physiatrist is providing close team supervision and 24 hour management of active medical problems listed below.  Physiatrist and rehab team continue to assess barriers to discharge/monitor patient progress toward functional and medical goals  Care Tool:  Bathing    Body parts bathed by patient: Right arm,Left arm,Chest,Abdomen,Front perineal area,Right upper leg,Buttocks,Left upper leg,Right lower leg,Face   Body parts bathed by helper: Right lower leg Body parts n/a: Left lower leg   Bathing assist Assist Level: Supervision/Verbal cueing     Upper Body Dressing/Undressing Upper body dressing   What is the patient wearing?: Hospital gown only    Upper body assist Assist Level: Supervision/Verbal cueing    Lower Body Dressing/Undressing Lower body dressing      What is the patient wearing?: Incontinence brief     Lower body assist Assist for lower body dressing: Minimal Assistance - Patient > 75%     Toileting Toileting    Toileting assist Assist for toileting: Minimal Assistance - Patient > 75%     Transfers Chair/bed transfer  Transfers assist     Chair/bed transfer assist level: Contact Guard/Touching assist (squat pivot) Chair/bed transfer assistive device:   Ambulation assist      Assist level: Minimal Assistance - Patient > 75% Assistive device: Walker-rolling  Max distance: 32ft   Walk 10 feet activity   Assist     Assist level: Minimal Assistance - Patient > 75% Assistive device: Walker-rolling   Walk 50 feet activity   Assist Walk 50 feet with 2 turns activity did not occur: Safety/medical concerns         Walk 150 feet activity   Assist Walk 150 feet activity did not occur: Safety/medical concerns         Walk 10 feet on uneven  surface  activity   Assist Walk 10 feet on uneven surfaces activity did not occur: Safety/medical concerns         Wheelchair     Assist Will patient use wheelchair at discharge?: Yes Type of Wheelchair: Manual    Wheelchair assist level: Supervision/Verbal cueing,Set up assist Max wheelchair distance: 150'    Wheelchair 50 feet with 2 turns activity    Assist        Assist Level: Supervision/Verbal cueing   Wheelchair 150 feet activity     Assist      Assist Level: Supervision/Verbal cueing   Blood pressure (!) 155/78, pulse 60, temperature 97.9 F (36.6 C), resp. rate 18, height 5\' 11"  (1.803 m), weight 113.2 kg, SpO2 96 %.  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  -patient may  shower -ELOS/Goals: 10-12d Sup/ModI goals  2/1- team conference today to update progress and finalize d/c plans 2. Antithrombotics: -DVT/anticoagulation:Chronic Eliquis has been resumed. -antiplatelet therapy:  3. Pain Management:Voltaren gel 4 times daily, Neurontin 400 mg 3 times daily, OxyContin CR 20 mg every 12 hours, oxycodone as needed, Robaxin 1000 mg every 6 hours as needed  1/25- on gabapentin - for phantom pain- will add Cymbalta 30 mg QHS for 4 days,then 60 mg QHS. Con't Oxycontin and Oxycodone.  1/26- will change Oxycodone to q4 hours prn 10 mg- and add Tramadol 50 mg q6 hours prn.   1/27- pt reports more manageable, but will encourage him to ask for meds. However not asked for tramadol YET_ will schedule tramadol and con't oxy prn q4 hours prn    1/28- will increase oxycontin to 30 mg BID for the moment- hopefully can go back to 20 mg BID by d/c? Might not- con't oxycodone and oxycontin and tramadol scheduled.  Started cymbalta 60mg  qhs on 1/29- may take several days/wks to achieve full effect  In addition pt took Oxy IR x 2  on 1/29  1/31- taken Oxy prn 3x/ since yesterday at 4pm- not taking as much as can- con't regimen- "manageable per pt now".   2/2- remind pt to take oxy prn- con't regimen 4. Mood:Provide emotional support -antipsychotic agents: N/A 5. Neuropsych: This patientiscapable of making decisions on hisown behalf. 6. Skin/Wound Care:Routine skin checks  1/24- eucerin BID added  1/27- will change dressing to BID and prn if saturated  1/28- recheck has eucerin BID  1/31- pt has specifically asked 2/28 to Call Dr 2/31 due to continued drainage- he's concerned about going home Friday with drainage/"not healing". I'm not sure it's a hematoma, but Hb last week was up to 7.9 - will recheck in AM 7. Fluids/Electrolytes/Nutrition:Routine in and outs with follow-up chemistries 8. Severe normocytic anemia. Follow-up CBC. Follow-up hematology services  1/25- Hb 7.5- stable so far.   1/27- will recheck labs in AM, esp since draining from L AKA incision  1/28- just came to get labs- awaiting/pending  labs  1.31- Hb up to 7.9- not actively bleeding except the drainage from L AKA  2/2- Hb up to 8.2- so no active bleeding from incision-con't regimen 9. Atrial fibrillation. Cardizem 240 mg daily, continue Eliquis. 10. 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 Latest creatinine 1.45, total CK 202. Follow-up renal services  1/27- will recheck in AM  1/28- labs pending  1.31- Cr down to 1.28- will recheck in AM  2/1- Cr 1.32- stable- con't regimen 11. Hypertension. Aldactone 25 mg dailyNormodyne 400 mg twice daily. Monitor with increased mobility  1/24- BP 145/91- slightly elevated- but will monitor since on multiple meds.  1/26- BP 190/95- will give prn-  Hydralazine for very elevated Bps.  Will add 25 mg q6 hours for SBP >180 or DBP >110.   1/27- BP 160s/81 this AM-will add  hydralazine scheduled for BP 10 mg TID  1/28- BP 169/87- added Hydralazine 10 mg TID yesterday- might need to increase over  weekend.  Vitals:   09/27/20 2141 09/28/20 0422  BP: (!) 175/95 (!) 155/78  Pulse:  60  Resp:  18  Temp:  97.9 F (36.6 C)  SpO2:  96%  still elevated will increase hydralazine to QID- has not used prn 25mg  dose  1/31- BP 150s systolic- doing better- con't regimen  2/1- BP 150s-160s /80s- will increase hydralazine to 25 mg q8 hours for better BP control  2/2- BP running 150s-170s systolic- just increased hydralazine- will give a couple of days 12. Aortic dissection with repair. Follow-up outpatient 13. Obesity. Dietary follow-up 14. Bronchospasms/wheezing  1/26- will add Albuterol nebs q4 hours prn- pt needs to get this AM.   1/27- will encourage nursing to do/ask for if pt has Sx's.  15. Noncardiac CP?  1/31- per Cards, CP this Am was GI related- Troponin agrees with this- will con't to monitor.  16. L AKA  2/1- still having drainage from posterior aspect of incision- and per Surgery PA- and d/w pt- pt was told sutures dissolvable- but will have him f/u with them after d/c.  2/2- Dr 2/31 OK with drainage/sounds like agrees it's a hematoma- con't dressing changes- assured pt it's not bad. Made sure dressing ordered 2x/day.   LOS: 9 days A FACE TO FACE EVALUATION WAS PERFORMED  Jackline Castilla 09/28/2020, 8:38 AM

## 2020-09-29 ENCOUNTER — Other Ambulatory Visit (HOSPITAL_COMMUNITY): Payer: Self-pay | Admitting: Physician Assistant

## 2020-09-29 MED ORDER — OXYCODONE HCL 5 MG PO TABS
5.0000 mg | ORAL_TABLET | Freq: Four times a day (QID) | ORAL | 0 refills | Status: DC
Start: 2020-09-29 — End: 2020-10-10

## 2020-09-29 MED ORDER — DILTIAZEM HCL ER COATED BEADS 240 MG PO CP24: 240.0000 mg | ORAL_CAPSULE | Freq: Every day | ORAL | 0 refills | Status: DC

## 2020-09-29 MED ORDER — METHOCARBAMOL 500 MG PO TABS
1000.0000 mg | ORAL_TABLET | Freq: Four times a day (QID) | ORAL | 0 refills | Status: DC | PRN
Start: 2020-09-29 — End: 2020-10-10

## 2020-09-29 MED ORDER — HYDRALAZINE HCL 25 MG PO TABS: 25.0000 mg | ORAL_TABLET | Freq: Three times a day (TID) | ORAL | 0 refills | Status: DC

## 2020-09-29 MED ORDER — OXYCODONE HCL ER 20 MG PO T12A: 20.0000 mg | EXTENDED_RELEASE_TABLET | Freq: Two times a day (BID) | ORAL | 0 refills | Status: DC

## 2020-09-29 MED ORDER — SPIRONOLACTONE 25 MG PO TABS: 25.0000 mg | ORAL_TABLET | Freq: Every day | ORAL | 0 refills | Status: DC

## 2020-09-29 MED ORDER — GABAPENTIN 400 MG PO CAPS: 400.0000 mg | ORAL_CAPSULE | Freq: Three times a day (TID) | ORAL | 0 refills | Status: DC

## 2020-09-29 MED ORDER — ELIQUIS 5 MG PO TABS: 5.0000 mg | ORAL_TABLET | Freq: Two times a day (BID) | ORAL | 3 refills | Status: DC

## 2020-09-29 MED ORDER — LABETALOL HCL 200 MG PO TABS: 400.0000 mg | ORAL_TABLET | Freq: Two times a day (BID) | ORAL | 3 refills | Status: DC

## 2020-09-29 MED ORDER — DICLOFENAC SODIUM 1 % EX GEL: 2.0000 g | Freq: Four times a day (QID) | CUTANEOUS | 0 refills | Status: DC

## 2020-09-29 MED FILL — LABETALOL HCL 200 MG TABS: 200 | 90 days supply | Qty: 360 | Fill #0

## 2020-09-29 MED FILL — hydrALAZINE HCL 25 MG TABS: 25 | 30 days supply | Qty: 90 | Fill #0

## 2020-09-29 MED FILL — CARTIA XT 240 MG CAPSULE: 240 | 30 days supply | Qty: 30 | Fill #0

## 2020-09-29 MED FILL — DICLOFENAC SODIUM 1 % GEL: 1 | 16 days supply | Qty: 200 | Fill #0

## 2020-09-29 MED FILL — oxyCODONE HCL 5 MG TABS: 5 | 3 days supply | Qty: 30 | Fill #0

## 2020-09-29 MED FILL — SPIRONOLACTONE 25 MG TABLET: 25 | 30 days supply | Qty: 30 | Fill #0

## 2020-09-29 MED FILL — METHOCARBAMOL 500 MG TABS: 500 | 15 days supply | Qty: 90 | Fill #0

## 2020-09-29 MED FILL — GABAPENTIN 400 MG CAPSULE: 400 | 30 days supply | Qty: 90 | Fill #0

## 2020-09-29 MED FILL — ELIQUIS 5 MG TABLET: 5 | 90 days supply | Qty: 180 | Fill #0

## 2020-09-29 NOTE — Progress Notes (Addendum)
Inpatient Rehabilitation Care Coordinator Discharge Note  The overall goal for the admission was met for:   Discharge location: Yes, Home   Length of Stay: Yes. 9 Days   Discharge activity level: Yes, Wheelchair Supervision  Home/community participation: Yes  Services provided included: MD, RD, PT, OT, SLP, RN, CM, TR, Pharmacy, Neuropsych and SW  Financial Services: Medicaid  Choices offered to/list presented to:patient  Follow-up services arranged: [facility] PT OT [facility] RN  Comments (or additional information): Wheelchair, Rolling Walker, Bedside Commode, Tub Transfer Bench (Patient requested to have delivered home, due to requesting PTAR transfer home)  Patient/Family verbalized understanding of follow-up arrangements: Yes  Individual responsible for coordination of the follow-up plan: self or Nicole (niece) [phone]  Confirmed correct DME delivered: Burkley Dech J Ranell Finelli 09/29/2020    Tinya Cadogan J Jayden Rudge 

## 2020-09-29 NOTE — Progress Notes (Addendum)
Physical Therapy Session Note  Patient Details  Name: Connor [name] MRN: [id number] Date of Birth: 05-29-1956  Today's Date: 09/29/2020 PT Individual Time: 1155-1225 PT Individual Time Calculation (min): 30 min   Short Term Goals: Week 1:  PT Short Term Goal 1 (Week 1): Patient will complete all bed mobility with no more CGA PT Short Term Goal 2 (Week 1): Patient will complete bed<> wc transfer with no more than CGA consistently and LRAD PT Short Term Goal 3 (Week 1): Patient will tolerate standing >26mins with LRAD and no more than CGA PT Short Term Goal 4 (Week 1): Patient will be able to teachback s/s of poor wound healing/infection in residual limb  Skilled Therapeutic Interventions/Progress Updates:     Patient in bed stating "I have a lot on my mind today" upon PT arrival. Patient alert and agreeable to PT session. Patient reported 7/10 L residual limb pain during session, RN made aware. PT provided repositioning, rest breaks, and distraction as pain interventions throughout session.   Patient declined offer to practice the stairs one more time prior to d/c. Patient able to teach-back technique performed earlier, but continues to decline another trial. Patient agreeable to simulate household ambulation.   Therapeutic Activity: Bed Mobility: Patient performed supine to sit with supervision and increased time effort to come to sitting in a flat bed without use of bed rails. Provided verbal cues for not using rails to simulate home set up and safety with L residual limb. Adjusted and educated on shrinker positioning and strap use for improved compression.  Transfers: Patient performed squat pivot bed<>w/c with supervision for safety due to decreased balance and sit to/from stand from w/c and ADL couch with CGA-close supervision using RW. Provided verbal cues for hand placement x1 with ADL couch.  Gait Training:  Patient ambulated 30 feet x2 over level tile and carpet to simulate home  surfaces using RW with supervision-CGA with min A x1 due to mild LOB. Ambulated with hop-to gait pattern on R with intermittent decreased control at initial contact. Provided verbal cues for safety awareness, proximity to RW, and use of upper extremities to control R foot placement.  Wheelchair Mobility:  Patient propelled wheelchair >100 feet, limited by fatigue, with mod I. Provided verbal cues for use of R leg rest x1 and breaks x1.  Educated patient on fall risk/prevention, home modifications to prevent falls, and activation of emergency services in the event of a fall during session.   Patient sitting EOB eating lunch, RN made aware, at end of session with breaks locked, bed alarm set, and all needs within reach.    Therapy Documentation Precautions:  Precautions Precautions: Fall Precaution Comments: L AKA Restrictions Weight Bearing Restrictions: Yes LLE Weight Bearing: Non weight bearing   Therapy/Group: Individual Therapy  Madeleyn Schwimmer L Bryahna Lesko PT, DPT  09/29/2020, 12:37 PM

## 2020-09-29 NOTE — Progress Notes (Signed)
Patient ID: Connor [name], male   DOB: 1956/02/11, 64 y.o.   MRN: [id number]   Patient referral sent to [facility].  [place], [place] [phone]

## 2020-09-29 NOTE — Progress Notes (Signed)
Occupational Therapy Discharge Summary  Patient Details  Name: Vernie Piet MRN: [id number] Date of Birth: [birth year]     Patient has met 9 of 9 long term goals due to improved activity tolerance, improved balance, postural control, ability to compensate for deficits and improved coordination.  Patient to discharge at overall Supervision level.   Reasons goals not met:  na  Recommendation:  Patient will benefit from ongoing skilled OT services in home health setting to continue to advance functional skills in the area of BADL and iADL.  Equipment: 3 in 1 commode, tub transfer bench  Reasons for discharge: treatment goals met  Patient/family agrees with progress made and goals achieved: Yes  OT Discharge Precautions/Restrictions  Precautions Precautions: Fall Precaution Comments: L AKA Restrictions Weight Bearing Restrictions: Yes LLE Weight Bearing: Non weight bearing General   Vital Signs Therapy Vitals Temp: 98.4 F (36.9 C) Temp Source: Oral Pulse Rate: 61 Resp: 18 BP: (!) 143/72 Patient Position (if appropriate): Lying Oxygen Therapy SpO2: 96 % O2 Device: Room Air Pain Pain Assessment Pain Scale: 0-10 Pain Score: 2  Pain Type: Phantom pain Pain Location: Leg Pain Descriptors / Indicators: Tender Pain Intervention(s): Repositioned ADL ADL Eating: Independent Grooming: Modified independent Where Assessed-Grooming: Sitting at sink Upper Body Bathing: Modified independent Where Assessed-Upper Body Bathing: Shower Lower Body Bathing: Setup,Supervision/safety Where Assessed-Lower Body Bathing: Shower Upper Body Dressing: Setup Where Assessed-Upper Body Dressing: Edge of bed Lower Body Dressing: Setup,Supervision/safety Where Assessed-Lower Body Dressing: Edge of bed Toileting: Supervision/safety Where Assessed-Toileting: Glass blower/designer: Close supervision Toilet Transfer Method: Counselling psychologist: Grab bars,Raised toilet  seat Tub/Shower Transfer: Close supervison Clinical cytogeneticist Method: Optometrist: Facilities manager: Close supervision Social research officer, government Method: Heritage manager: Gaffer Baseline Vision/History: Wears glasses Wears Glasses: Reading only Patient Visual Report: No change from baseline Perception    Praxis Praxis: Intact Cognition Overall Cognitive Status: Within Functional Limits for tasks assessed Arousal/Alertness: Awake/alert Orientation Level: Oriented X4 Safety/Judgment: Appears intact Sensation Coordination Fine Motor Movements are Fluid and Coordinated: Yes Motor  Motor Motor: Within Functional Limits Mobility  Bed Mobility Supine to Sit: Independent Sitting - Scoot to Edge of Bed: Independent Sit to Supine: Independent  Trunk/Postural Assessment  Cervical Assessment Cervical Assessment: Within Functional Limits Thoracic Assessment Thoracic Assessment: Within Functional Limits  Balance Static Sitting Balance Static Sitting - Level of Assistance: 6: Modified independent (Device/Increase time) Dynamic Sitting Balance Dynamic Sitting - Level of Assistance: 6: Modified independent (Device/Increase time) Static Standing Balance Static Standing - Level of Assistance: 6: Modified independent (Device/Increase time) Dynamic Standing Balance Dynamic Standing - Level of Assistance: 5: Stand by assistance Extremity/Trunk Assessment RUE Assessment RUE Assessment: Within Functional Limits LUE Assessment LUE Assessment: Within Functional Limits   Erline Levine A Sinia Antosh 09/29/2020, 4:04 PM

## 2020-09-29 NOTE — Progress Notes (Signed)
Patient ID: Connor [name], male   DOB: 1956-06-10, 65 y.o.   MRN: [id number]   Patient declined by [facility].   [place], [place] [phone]

## 2020-09-29 NOTE — Progress Notes (Signed)
Patient ID: Connor [name], male   DOB: Jan 19, 1956, 65 y.o.   MRN: [id number]   Patient requested to have PTAR transportation scheduled Friday at [facility], [place] [phone]

## 2020-09-29 NOTE — Progress Notes (Incomplete Revision)
Inpatient Rehabilitation Care Coordinator Discharge Note  The overall goal for the admission was met for:   Discharge location: Yes, Home   Length of Stay: Yes. 9 Days   Discharge activity level: Yes, Wheelchair Supervision  Home/community participation: Yes  Services provided included: MD, RD, PT, OT, SLP, RN, CM, TR, Pharmacy, Neuropsych and SW  Financial Services: Medicaid  Choices offered to/list presented [id number]  Follow-up services arranged: [facility] RN  Comments (or additional information): Wheelchair, [employer], Bedside Commode, Tub Producer, television/film/video (Patient requested to have delivered home, due to requesting PTAR transfer home)  Patient/Family verbalized understanding of follow-up arrangements: Yes  Individual responsible for coordination of the follow-up plan: self or Elmyra Ricks (niece) [phone]  Confirmed correct DME delivered: Dyanne Iha 09/29/2020    Dyanne Iha

## 2020-09-29 NOTE — Progress Notes (Signed)
Occupational Therapy Session Note  Patient Details  Name: Connor [name] MRN: [id number] Date of Birth: 03/30/1956  Today's Date: 09/29/2020 OT Individual Time: 1405-1450 OT Individual Time Calculation (min): 45 min    Short Term Goals: Week 1:  OT Short Term Goal 1 (Week 1): Pt will perform BSC/toilet transfers with CGA OT Short Term Goal 2 (Week 1): Pt will perform LB bathe/dress with CGA using AE PRN OT Short Term Goal 3 (Week 1): Pt will perform ADL/activity for 15+ minutes without rest break OT Short Term Goal 4 (Week 1): Pt will perform clothing management standing with CGA  Skilled Therapeutic Interventions/Progress Updates:    Patient in bed, states that he is tired and has no questions.  He agrees to practice mobility and functional transfers at this time.  Supine to/from sitting edge of bed mod I.  Sit to stand and SPT with RW to/from bed, commode, shower bench (in tub/shower) w/c with supervision, ambulation with RW CS household distances.  He is able to propel w/c to and from therapy gym with increased time.  Reviewed set up of shower bench and commode - he states that cousin will help with set up upon return home and has no further questions.  Reviewed and practiced reach and transport of items in kitchen setting using RW - CGA and cues after initial instruction but he demonstrates ability to complete at CS after practice.  He completed sit pivot transfer to/from bed and w/c at close of session mod I.  He remained in bed at close of session, call bell in hand and bed alarm set.    Therapy Documentation Precautions:  Precautions Precautions: Fall Precaution Comments: L AKA Restrictions Weight Bearing Restrictions: Yes LLE Weight Bearing: Non weight bearing   Therapy/Group: Individual Therapy  Barrie Lyme 09/29/2020, 7:42 AM

## 2020-09-29 NOTE — Progress Notes (Signed)
Patient ID: Connor [name], male   DOB: 26-Nov-1955, 65 y.o.   MRN: [id number]   Patient hospital follow up scheduled with Hoy Register, MD at [facility] and [facility].  [place], [place] [phone]

## 2020-09-29 NOTE — Progress Notes (Signed)
[facility] PHYSICAL MEDICINE & REHABILITATION PROGRESS NOTE   Subjective/Complaints:  Pt concerned- just mentioned lost tennis shoes before came to CIR while in hospital- explained size 11.5 Nike grey and gold Vipers.   Pt also reports LBM last night.  Per staff, little blood in dressing, but a lot less than last few days.  Just changed.    ROS:   Pt denies SOB, abd pain, CP, N/V/C/D, and vision changes  Objective:   No results found. Recent Labs    09/27/20 0444  WBC 6.9  HGB 8.2*  HCT 26.3*  PLT 310   Recent Labs    09/27/20 0444  NA 139  K 4.2  CL 103  CO2 27  GLUCOSE 96  BUN 15  CREATININE 1.32*  CALCIUM 8.6*    Intake/Output Summary (Last 24 hours) at 09/29/2020 0927 Last data filed at 09/29/2020 0814 Gross per 24 hour  Intake 236 ml  Output 400 ml  Net -164 ml        Physical Exam: Vital Signs Blood pressure 140/86, pulse 65, temperature 97.9 F (36.6 C), temperature source Oral, resp. rate 16, height 5\' 11"  (1.803 m), weight 113.2 kg, SpO2 96 %.  Physical Exam   Sitting up in w/c- working with PT, appropriate, NAD Mood and affect are still flat Heart: RRR- no JVD Lungs: CTA B/L- no W/R/R- good air movement Abdomen: Soft, NT, ND, (+)BS  Extremities: No clubbing, cyanosis, or edema Skin: No evidence of breakdown, no evidence of rash   L AKA Skin:  LL AKA_ vertical incision- moderate clotted drainage on dressing- draining from 1 spot on posterior incision- doesn't drain with palpation- no erythema seen- flatter/less swelling around incision C/D/I today- couldn't see incision- just dressed Neurologic: Ox3 Cranial nerves II through XII intact, motor strength is 5/5 in bilateral deltoid, bicep, tricep, grip, Right hip flexor, knee extensors, ankle dorsiflexor and plantar flexor, 3 - left hip flexor,  Sensory exam normal sensation to light touch  in bilateral upper and lower extremities Musculoskeletal: L AKA- as above UE's 5/5 B/L; RLE-  5/5      Assessment/Plan: 1. Functional deficits which require 3+ hours per day of interdisciplinary therapy in a comprehensive inpatient rehab setting.  Physiatrist is providing close team supervision and 24 hour management of active medical problems listed below.  Physiatrist and rehab team continue to assess barriers to discharge/monitor patient progress toward functional and medical goals  Care Tool:  Bathing    Body parts bathed by patient: Right arm,Left arm,Chest,Abdomen,Front perineal area,Right upper leg,Buttocks,Left upper leg,Right lower leg,Face   Body parts bathed by helper: Right lower leg Body parts n/a: Left lower leg   Bathing assist Assist Level: Supervision/Verbal cueing     Upper Body Dressing/Undressing Upper body dressing   What is the patient wearing?: Hospital gown only    Upper body assist Assist Level: Supervision/Verbal cueing    Lower Body Dressing/Undressing Lower body dressing      What is the patient wearing?: Pants     Lower body assist Assist for lower body dressing: Minimal Assistance - Patient > 75%     Toileting Toileting    Toileting assist Assist for toileting: Minimal Assistance - Patient > 75%     Transfers Chair/bed transfer  Transfers assist     Chair/bed transfer assist level: Contact Guard/Touching assist Chair/bed transfer assistive device:   Ambulation assist      Assist level: Minimal Assistance - Patient > 75%  Assistive device: Walker-rolling Max distance: 84ft   Walk 10 feet activity   Assist     Assist level: Minimal Assistance - Patient > 75% Assistive device: Walker-rolling   Walk 50 feet activity   Assist Walk 50 feet with 2 turns activity did not occur: Safety/medical concerns         Walk 150 feet activity   Assist Walk 150 feet activity did not occur: Safety/medical concerns         Walk 10 feet on uneven surface  activity   Assist  Walk 10 feet on uneven surfaces activity did not occur: Safety/medical concerns         Wheelchair     Assist Will patient use wheelchair at discharge?: Yes Type of Wheelchair: Manual    Wheelchair assist level: Supervision/Verbal cueing,Set up assist Max wheelchair distance: 150'    Wheelchair 50 feet with 2 turns activity    Assist        Assist Level: Supervision/Verbal cueing   Wheelchair 150 feet activity     Assist      Assist Level: Supervision/Verbal cueing   Blood pressure 140/86, pulse 65, temperature 97.9 F (36.6 C), temperature source Oral, resp. rate 16, height 5\' 11"  (1.803 m), weight 113.2 kg, SpO2 96 %.  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  -patient may  shower -ELOS/Goals: 10-12d Sup/ModI goals  2/1- team conference today to update progress and finalize d/c plans 2. Antithrombotics: -DVT/anticoagulation:Chronic Eliquis has been resumed. -antiplatelet therapy:  3. Pain Management:Voltaren gel 4 times daily, Neurontin 400 mg 3 times daily, OxyContin CR 20 mg every 12 hours, oxycodone as needed, Robaxin 1000 mg every 6 hours as needed  1/25- on gabapentin - for phantom pain- will add Cymbalta 30 mg QHS for 4 days,then 60 mg QHS. Con't Oxycontin and Oxycodone.  1/26- will change Oxycodone to q4 hours prn 10 mg- and add Tramadol 50 mg q6 hours prn.   1/27- pt reports more manageable, but will encourage him to ask for meds. However not asked for tramadol YET_ will schedule tramadol and con't oxy prn q4 hours prn    1/28- will increase oxycontin to 30 mg BID for the moment- hopefully can go back to 20 mg BID by d/c? Might not- con't oxycodone and oxycontin and tramadol scheduled.  Started cymbalta 60mg  qhs on 1/29- may take several days/wks to achieve full effect  In addition pt took Oxy IR x 2 on  1/29  1/31- taken Oxy prn 3x/ since yesterday at 4pm- not taking as much as can- con't regimen- "manageable per pt now".   2/2- remind pt to take oxy prn- con't regimen  2/3- pt needs to take Oxy prn- in addition to scheduled pain meds-pain 8/10 fyi  4. Mood:Provide emotional support -antipsychotic agents: N/A 5. Neuropsych: This patientiscapable of making decisions on hisown behalf. 6. Skin/Wound Care:Routine skin checks  1/24- eucerin BID added  1/27- will change dressing to BID and prn if saturated  1/28- recheck has eucerin BID  1/31- pt has specifically asked 2/28 to Call Dr 2/31 due to continued drainage- he's concerned about going home Friday with drainage/"not healing". I'm not sure it's a hematoma, but Hb last week was up to 7.9 - will recheck in AM 7. Fluids/Electrolytes/Nutrition:Routine in and outs with follow-up chemistries 8. Severe normocytic anemia. Follow-up CBC. Follow-up hematology services  1/25- Hb 7.5- stable so far.  1/27- will recheck labs in AM, esp since draining from L AKA incision  1/28- just came to get labs- awaiting/pending labs  1.31- Hb up to 7.9- not actively bleeding except the drainage from L AKA  2/2- Hb up to 8.2- so no active bleeding from incision-con't regimen 9. Atrial fibrillation. Cardizem 240 mg daily, continue Eliquis. 10. 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 Latest creatinine 1.45, total CK 202. Follow-up renal services  1/27- will recheck in AM  1/28- labs pending  1.31- Cr down to 1.28- will recheck in AM  2/1- Cr 1.32- stable- con't regimen 11. Hypertension. Aldactone 25 mg dailyNormodyne 400 mg twice daily. Monitor with increased mobility  1/24- BP 145/91- slightly elevated- but will monitor since on multiple meds.  1/26- BP 190/95- will give prn-  Hydralazine for very elevated Bps.  Will add 25 mg q6 hours for SBP >180 or DBP >110.   1/27- BP 160s/81 this AM-will add  hydralazine scheduled for BP 10 mg TID  1/28-  BP 169/87- added Hydralazine 10 mg TID yesterday- might need to increase over weekend.  Vitals:   09/28/20 2257 09/29/20 0444  BP: 133/74 140/86  Pulse:  65  Resp:  16  Temp:  97.9 F (36.6 C)  SpO2:  96%  still elevated will increase hydralazine to QID- has not used prn 25mg  dose  1/31- BP 150s systolic- doing better- con't regimen  2/1- BP 150s-160s /80s- will increase hydralazine to 25 mg q8 hours for better BP control  2/2- BP running 150s-170s systolic- just increased hydralazine- will give a couple of days  2/3- BP better 130s-140s/70s- con't regimen doing better 12. Aortic dissection with repair. Follow-up outpatient 13. Obesity. Dietary follow-up 14. Bronchospasms/wheezing  1/26- will add Albuterol nebs q4 hours prn- pt needs to get this AM.   1/27- will encourage nursing to do/ask for if pt has Sx's.  15. Noncardiac CP?  1/31- per Cards, CP this Am was GI related- Troponin agrees with this- will con't to monitor.  16. L AKA  2/1- still having drainage from posterior aspect of incision- and per Surgery PA- and d/w pt- pt was told sutures dissolvable- but will have him f/u with them after d/c.  2/2- Dr 2/31 OK with drainage/sounds like agrees it's a hematoma- con't dressing changes- assured pt it's not bad. Made sure dressing ordered 2x/day.  2/3- less drainage- con't dressings BID  17. Dispo  2/3- d/c tomorrow Will need f/u with Dr Lajoyce Corners and myself and needs info for prosthetics company.    LOS: 10 days A FACE TO FACE EVALUATION WAS PERFORMED  Connor [name] 09/29/2020, 9:27 AM

## 2020-09-29 NOTE — Progress Notes (Signed)
Patient ID: Connor [name], male   DOB: 02-09-1956, 64 y.o.   MRN: [id number]   Patient declined by De Burrs, [place] [phone]

## 2020-09-29 NOTE — Progress Notes (Signed)
Physical Therapy Session Note  Patient Details  Name: Connor [name] MRN: [id number] Date of Birth: 08-20-1956  Today's Date: 09/29/2020 PT Individual Time: 0930-1025 PT Individual Time Calculation (min): 55 min   Short Term Goals: Week 1:  PT Short Term Goal 1 (Week 1): Patient will complete all bed mobility with no more CGA PT Short Term Goal 2 (Week 1): Patient will complete bed<> wc transfer with no more than CGA consistently and LRAD PT Short Term Goal 3 (Week 1): Patient will tolerate standing >58mins with LRAD and no more than CGA PT Short Term Goal 4 (Week 1): Patient will be able to teachback s/s of poor wound healing/infection in residual limb Week 2:    Week 3:     Skilled Therapeutic Interventions/Progress Updates:    PAIN pt c/o residual limb pain but states he just received pain meds.  Treatment to tolerance.  Pain did not limit session going forward.  Pt initially supine.  States "I dont know how much I will be worth to you"  Also states "I have a lot on my mind"  Did state that home entry was one of his concerns.  Pt noted to have shrinker on but rotated 90* and belt unusable in this position.  Removed and repositioned shrinker appropriately w/belt secured.  Reviewed proper positioning/alignment of shrinker/belt w/pt. Pt then dons pants w/set up.  Supine to sit w/rail mod I.   Sit to stand w/bed elevated w/cga, low bed min assist. Short distance gait/turn sit to wc w/cga. wc propulsion mod I to main gym >168ft including mult turns.  Discussed home entry options and pt described is plan as "hopping up on one foot w/cousin behind him making sure he does not fall.  Further clarification pt stating he was planning to use walker despite stating his steps at either entry did not exceed 15" depth.  Demonstrated each of these two plans to pt and pt agreed to nonfeasability of these options following demo. Pt then stated that he had crutches at home thathad  belonged to uncle who was  75ft 9in and were adjustable.  Therapist then instructed pt w/ascending/descending 6in stairs w/single rail + crutch.  Pt performed stairs as demonstrated w/min assist of 1 for balance esp w/turning at top of stair/switching crutch to opposite side. Pt declined repeating for practice but stated he felt confident with this option.  Stated neighbor going to [employer] today to explore options for ramp.    Therapist Demonstrated for pt how to adjust crutches for proper fit.  Pt voiced understanding.  wc propulsion >173ft mod I to room.   Squat pivot transfer wc to bed w/set up and cga to l side.  Sit to supine mod I.  Scoots w/use of bed control features/mod I.  Pt left supine w/rails up x 4, alarm set, bed in lowest position, and needs in reach.   Therapy Documentation Precautions:  Precautions Precautions: Fall Precaution Comments: L AKA Restrictions Weight Bearing Restrictions: Yes LLE Weight Bearing: Non weight bearing   Therapy/Group: Individual Therapy  [name], PT   [name] 09/29/2020, 12:44 PM

## 2020-09-29 NOTE — Progress Notes (Signed)
Physical Therapy Discharge Summary  Patient Details  Name: Connor [name] MRN: [id number] Date of Birth: [birth year]  Today's Date: 09/29/2020 PT Individual Time: 0800-0900 PT Individual Time Calculation (min): 60 min    Patient has met 10 of 12 long term goals due to improved activity tolerance, improved balance, improved postural control, increased strength, ability to compensate for deficits and improved coordination.  Patient to discharge at a wheelchair level Supervision.   Patient's care partner is independent to provide the necessary physical assistance at discharge.  Reasons goals not met: pt continues to require CGA for gait training for safety.   Recommendation:  Patient will benefit from ongoing skilled PT services in home health setting to continue to advance safe functional mobility, address ongoing impairments in standing balance, gait training, stair training, and minimize fall risk.  Equipment: rolling walker, 20x18 manual WC with Lt side amputee pad  Reasons for discharge: discharge from hospital  Patient/family agrees with progress made and goals achieved: Yes  PT Discharge Precautions/Restrictions Precautions Precautions: Fall Precaution Comments: L AKA Restrictions Weight Bearing Restrictions: Yes LLE Weight Bearing: Non weight bearing Vital Signs   Pain Pain Assessment Pain Scale: 0-10 Pain Score: 8  Pain Type: Surgical pain Pain Location: Leg Pain Orientation: Left Pain Descriptors / Indicators: Discomfort Pain Frequency: Constant Pain Onset: On-going Patients Stated Pain Goal: 3 Pain Intervention(s): Medication (See eMAR) Vision/Perception  Perception Perception: Within Functional Limits Praxis Praxis: Intact  Cognition Orientation Level: Oriented X4 Attention: Focused Focused Attention: Appears intact Awareness: Appears intact Problem Solving: Appears intact Executive Function: Reasoning;Sequencing Reasoning: Appears intact Sequencing:  Appears intact Safety/Judgment: Appears intact Sensation Sensation Light Touch: Impaired by gross assessment Coordination Gross Motor Movements are Fluid and Coordinated: No Fine Motor Movements are Fluid and Coordinated: Yes Motor  Motor Motor - Skilled Clinical Observations: limited 2/2 pain and amputation  Mobility Bed Mobility Bed Mobility: Rolling Right;Rolling Left;Supine to Sit;Sitting - Scoot to [employer] of Bed;Sit to Supine;Scooting to [facility] Rolling Right: Independent with assistive device Rolling Left: Independent with assistive device Supine to Sit: Independent with assistive device Sitting - Scoot to Edge of Bed: Independent with assistive device Sit to Supine: Independent with assistive device Scooting to [facility]: Independent with assistive device Transfers Transfers: Sit to Stand;Stand to Sit;Stand Pivot Transfers Sit to Stand: Independent with assistive device Stand to Sit: Supervision/Verbal cueing Stand Pivot Transfers: Supervision/Verbal cueing Stand Pivot Transfer Details: Verbal cues for safe use of DME/AE Transfer (Assistive device): Rolling walker Locomotion  Gait Ambulation: Yes Gait Assistance: Contact Guard/Touching assist Gait Distance (Feet): 20 Feet Assistive device: Rolling walker Gait Assistance Details: Verbal cues for technique;Verbal cues for precautions/safety;Verbal cues for safe use of DME/AE Gait Gait: Yes Gait Pattern: Impaired Gait Pattern: Trunk flexed;Right foot flat (hopping technique 2/2 amputation) Gait velocity: decreased Stairs / Additional Locomotion Stairs: Yes Stairs Assistance: Minimal Assistance - Patient > 75% Stair Management Technique: Two rails Number of Stairs: 2 Height of Stairs: 6 Ramp: Independent with assistive device (in manual WC) Wheelchair Mobility Wheelchair Mobility: Yes Wheelchair Assistance: Independent with Camera operator: Both upper extremities Distance: 250  Trunk/Postural  Assessment  Cervical Assessment Cervical Assessment: Within Functional Limits Thoracic Assessment Thoracic Assessment: Within Functional Limits Lumbar Assessment Lumbar Assessment: Exceptions to [facility] (posterior pelvic tilt)  Balance Balance Balance Assessed: Yes Static Sitting Balance Static Sitting - Balance Support: Feet supported Static Sitting - Level of Assistance: 6: Modified independent (Device/Increase time) Dynamic Sitting Balance Dynamic Sitting - Balance Support: Feet supported Dynamic Sitting - Level  of Assistance: 6: Modified independent (Device/Increase time) Static Standing Balance Static Standing - Balance Support: Bilateral upper extremity supported Static Standing - Level of Assistance: 6: Modified independent (Device/Increase time) Dynamic Standing Balance Dynamic Standing - Balance Support: During functional activity;Bilateral upper extremity supported Dynamic Standing - Level of Assistance: 5: Stand by assistance Dynamic Standing - Balance Activities: Forward lean/weight shifting;Reaching for objects Extremity Assessment      RLE Assessment General Strength Comments: grossly 4+/5 LLE Assessment General Strength Comments: hip flexion 4/5, hip abduction 4/5, hip adduction 4/5    Junie Panning 09/29/2020, 10:11 AM

## 2020-09-29 NOTE — Progress Notes (Signed)
Physical Therapy Session Note  Patient Details  Name: Connor [name] MRN: [id number] Date of Birth: 1955-11-17  Today's Date: 09/29/2020 PT Individual Time: 0800-0900 PT Individual Time Calculation (min): 60 min   Short Term Goals: Week 1:  PT Short Term Goal 1 (Week 1): Patient will complete all bed mobility with no more CGA PT Short Term Goal 2 (Week 1): Patient will complete bed<> wc transfer with no more than CGA consistently and LRAD PT Short Term Goal 3 (Week 1): Patient will tolerate standing >40mins with LRAD and no more than CGA PT Short Term Goal 4 (Week 1): Patient will be able to teachback s/s of poor wound healing/infection in residual limb      Skilled Therapeutic Interventions/Progress Updates:    pt received in bed and agreeable to therapy, 8/10 pain in residual limb. Nursing present for wound care change. Pt directed in supine>sit from flat bed at I level with instruction to not use hospital bed functions. Pt then directed in Stand pivot transfer to [facility] supervision, able to use WC parts for breaks and leg rest. Pt then directed in [facility] mobility for 250' x2 at [facility] I for extra time. Pt directed in car transfer with Rolling walker with VC for technique and setup CGA. Pt directed in ascending/desending ramp in WC mod I with extra time to complete. Pt then directed in WC mobility to gym 150' mod I. Pt then directed in ascending/desending x2 stairs with 2 rails at min A. Pt educated that as he only has one rail at home, to attempt without both rails, pt declined to attempt at this time. PT and pt discussed stair entry to home again and pt then reporting his back stairs do not have rails however they are deeper and thinks the walker can fit on each step, PT visually demonstrated how to complete as pt refused to attempt at this time, pt reports his cousin is able to provide assistance for this. Pt requesting to attempt crutch use for steps with additional PT session this date. That therapist  updated. Pt requesting to return to room then attempt gait training. Pt directed in [facility] mobility back to room 250' mod I; then agreeable to gait training in room with Rolling walker 30' total with x2 turns at [facility]. Pt directed in returning to [facility], requested to remain in this. Pt left in WC, alarm belt set, All needs in reach and in good condition. Call light in hand.    Therapy Documentation Precautions:  Precautions Precautions: Fall Precaution Comments: L AKA Restrictions Weight Bearing Restrictions: Yes LLE Weight Bearing: Non weight bearing General:   Vital Signs:   Pain: Pain Assessment Pain Scale: 0-10 Pain Score: 8  Pain Type: Surgical pain Pain Location: Leg Pain Orientation: Left Pain Descriptors / Indicators: Discomfort Pain Onset: On-going Mobility: Bed Mobility Bed Mobility: Rolling Right;Rolling Left;Supine to Sit;Sitting - Scoot to [place] of Bed;Sit to Supine;Scooting to [facility] Rolling Right: Independent with assistive device Rolling Left: Independent with assistive device Supine to Sit: Independent with assistive device Sitting - Scoot to Edge of Bed: Independent with assistive device Sit to Supine: Independent with assistive device Scooting to [facility]: Independent with assistive device Transfers Transfers: Sit to Stand;Stand to Sit;Stand Pivot Transfers Sit to Stand: Independent with assistive device Stand to Sit: Supervision/Verbal cueing Stand Pivot Transfers: Supervision/Verbal cueing Stand Pivot Transfer Details: Verbal cues for safe use of DME/AE Transfer (Assistive device): Rolling walker Locomotion : Gait Ambulation: Yes Gait Assistance: Contact Guard/Touching assist Gait Distance (  Feet): 30 Feet Assistive device: Rolling walker Gait Assistance Details: Verbal cues for technique;Verbal cues for precautions/safety;Verbal cues for safe use of DME/AE Gait Gait: Yes Gait Pattern: Impaired Gait Pattern: Trunk flexed;Right foot flat (hopping technique 2/2  amputation) Gait velocity: decreased Stairs / Additional Locomotion Stairs: Yes Stairs Assistance: Minimal Assistance - Patient > 75% Stair Management Technique: Two rails Number of Stairs: 2 Height of Stairs: 6 Ramp: Independent with assistive device (in manual WC) Wheelchair Mobility Wheelchair Mobility: Yes Wheelchair Assistance: Independent with Scientist, research (life sciences): Both upper extremities Distance: 250  Trunk/Postural Assessment : Cervical Assessment Cervical Assessment: Within Functional Limits Thoracic Assessment Thoracic Assessment: Within Functional Limits Lumbar Assessment Lumbar Assessment: Exceptions to [facility] (posterior pelvic tilt)  Balance: Balance Balance Assessed: Yes Static Sitting Balance Static Sitting - Balance Support: Feet supported Static Sitting - Level of Assistance: 6: Modified independent (Device/Increase time) Dynamic Sitting Balance Dynamic Sitting - Balance Support: Feet supported Dynamic Sitting - Level of Assistance: 6: Modified independent (Device/Increase time) Static Standing Balance Static Standing - Balance Support: Bilateral upper extremity supported Static Standing - Level of Assistance: 6: Modified independent (Device/Increase time) Dynamic Standing Balance Dynamic Standing - Balance Support: During functional activity;Bilateral upper extremity supported Dynamic Standing - Level of Assistance: 5: Stand by assistance Dynamic Standing - Balance Activities: Forward lean/weight shifting;Reaching for objects Exercises:   Other Treatments:      Therapy/Group: Individual Therapy  Barbaraann Faster 09/29/2020, 12:11 PM

## 2020-09-30 ENCOUNTER — Telehealth (HOSPITAL_COMMUNITY): Payer: Self-pay

## 2020-09-30 NOTE — Progress Notes (Signed)
[facility] PHYSICAL MEDICINE & REHABILITATION PROGRESS NOTE   Subjective/Complaints:  Doing well this AM- going home today Transport at 3pm.  Drainage has stopped.    ROS:   Pt denies SOB, abd pain, CP, N/V/C/D, and vision changes   Objective:   No results found. No results for input(s): WBC, HGB, HCT, PLT in the last 72 hours. No results for input(s): NA, K, CL, CO2, GLUCOSE, BUN, CREATININE, CALCIUM in the last 72 hours.  Intake/Output Summary (Last 24 hours) at 09/30/2020 0817 Last data filed at 09/30/2020 5277 Gross per 24 hour  Intake --  Output 1050 ml  Net -1050 ml        Physical Exam: Vital Signs Blood pressure (!) 155/83, pulse (!) 58, temperature 98 F (36.7 C), temperature source Oral, resp. rate 18, height 5\' 11"  (1.803 m), weight 113.2 kg, SpO2 91 %.  Physical Exam   Laying in bed; sleepy- just woke up, NAD Mood and affect are still flat Heart: RRR Lungs: CTA B/L- no W/R/R- good air movement Abdomen: Soft, NT, ND, (+)BS   Extremities: No clubbing, cyanosis, or edema Skin: No evidence of breakdown, no evidence of rash   L AKA Skin:  LL AKA_ vertical incision- moderate clotted drainage on dressing- draining from 1 spot on posterior incision- doesn't drain with palpation- no erythema seen- flatter/less swelling around incision C/D/I today- not assessed today Neurologic: Ox3 Cranial nerves II through XII intact, motor strength is 5/5 in bilateral deltoid, bicep, tricep, grip, Right hip flexor, knee extensors, ankle dorsiflexor and plantar flexor, 3 - left hip flexor,  Sensory exam normal sensation to light touch  in bilateral upper and lower extremities Musculoskeletal: L AKA- as above UE's 5/5 B/L; RLE- 5/5      Assessment/Plan: 1. Functional deficits which require 3+ hours per day of interdisciplinary therapy in a comprehensive inpatient rehab setting.  Physiatrist is providing close team supervision and 24 hour management of active medical  problems listed below.  Physiatrist and rehab team continue to assess barriers to discharge/monitor patient progress toward functional and medical goals  Care Tool:  Bathing    Body parts bathed by patient: Right arm,Left arm,Chest,Abdomen,Front perineal area,Right upper leg,Buttocks,Left upper leg,Right lower leg,Face   Body parts bathed by helper: Right lower leg Body parts n/a: Left lower leg   Bathing assist Assist Level: Set up assist     Upper Body Dressing/Undressing Upper body dressing   What is the patient wearing?: Hospital gown only    Upper body assist Assist Level: Set up assist Assistive Device Comment: no other clothing available and patient declines paper scrub top  Lower Body Dressing/Undressing Lower body dressing      What is the patient wearing?: Pants     Lower body assist Assist for lower body dressing: Set up assist     Toileting Toileting    Toileting assist Assist for toileting: Set up assist     Transfers Chair/bed transfer  Transfers assist     Chair/bed transfer assist level: Supervision/Verbal cueing Chair/bed transfer assistive device:   Ambulation assist      Assist level: Contact Guard/Touching assist Assistive device: Walker-rolling Max distance: 30   Walk 10 feet activity   Assist     Assist level: Contact Guard/Touching assist Assistive device: Walker-rolling   Walk 50 feet activity   Assist Walk 50 feet with 2 turns activity did not occur: Safety/medical concerns         Walk  150 feet activity   Assist Walk 150 feet activity did not occur: Safety/medical concerns         Walk 10 feet on uneven surface  activity   Assist Walk 10 feet on uneven surfaces activity did not occur: Safety/medical concerns         Wheelchair     Assist Will patient use wheelchair at discharge?: Yes Type of Wheelchair: Manual    Wheelchair assist level: Independent Max  wheelchair distance: 250    Wheelchair 50 feet with 2 turns activity    Assist        Assist Level: Independent   Wheelchair 150 feet activity     Assist      Assist Level: Independent   Blood pressure (!) 155/83, pulse (!) 58, temperature 98 F (36.7 C), temperature source Oral, resp. rate 18, height 5\' 11"  (1.803 m), weight 113.2 kg, SpO2 91 %.  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  -patient may  shower -ELOS/Goals: 10-12d Sup/ModI goals  2/1- team conference today to update progress and finalize d/c plans  2/4- d/c today 2. Antithrombotics: -DVT/anticoagulation:Chronic Eliquis has been resumed. -antiplatelet therapy:  3. Pain Management:Voltaren gel 4 times daily, Neurontin 400 mg 3 times daily, OxyContin CR 20 mg every 12 hours, oxycodone as needed, Robaxin 1000 mg every 6 hours as needed  1/25- on gabapentin - for phantom pain- will add Cymbalta 30 mg QHS for 4 days,then 60 mg QHS. Con't Oxycontin and Oxycodone.  1/26- will change Oxycodone to q4 hours prn 10 mg- and add Tramadol 50 mg q6 hours prn.   1/27- pt reports more manageable, but will encourage him to ask for meds. However not asked for tramadol YET_ will schedule tramadol and con't oxy prn q4 hours prn    1/28- will increase oxycontin to 30 mg BID for the moment- hopefully can go back to 20 mg BID by d/c? Might not- con't oxycodone and oxycontin and tramadol scheduled.  Started cymbalta 60mg  qhs on 1/29- may take several days/wks to achieve full effect  In addition pt took Oxy IR x 2 on 1/29  1/31- taken Oxy prn 3x/ since yesterday at 4pm- not taking as much as can- con't regimen- "manageable per pt now".   2/2- remind pt to take oxy prn- con't regimen  2/3- pt needs to take Oxy prn- in addition to scheduled pain meds-pain 8/10 fyi   2/4- go home today with pain  meds Rx-  4. Mood:Provide emotional support -antipsychotic agents: N/A 5. Neuropsych: This patientiscapable of making decisions on hisown behalf. 6. Skin/Wound Care:Routine skin checks  1/24- eucerin BID added  1/27- will change dressing to BID and prn if saturated  1/28- recheck has eucerin BID  1/31- pt has specifically asked 2/28 to Call Dr 2/31 due to continued drainage- he's concerned about going home Friday with drainage/"not healing". I'm not sure it's a hematoma, but Hb last week was up to 7.9 - will recheck in AM 7. Fluids/Electrolytes/Nutrition:Routine in and outs with follow-up chemistries 8. Severe normocytic anemia. Follow-up CBC. Follow-up hematology services  1/25- Hb 7.5- stable so far.   1/27- will recheck labs in AM, esp since draining from L AKA incision  1/28- just came to get labs- awaiting/pending labs  1.31- Hb up to 7.9- not actively bleeding except the drainage from L AKA  2/2- Hb up to 8.2- so no active bleeding from  incision-con't regimen 9. Atrial fibrillation. Cardizem 240 mg daily, continue Eliquis. 10. 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 Latest creatinine 1.45, total CK 202. Follow-up renal services  1/27- will recheck in AM  1/28- labs pending  1.31- Cr down to 1.28- will recheck in AM  2/1- Cr 1.32- stable- con't regimen 11. Hypertension. Aldactone 25 mg dailyNormodyne 400 mg twice daily. Monitor with increased mobility  1/24- BP 145/91- slightly elevated- but will monitor since on multiple meds.  1/26- BP 190/95- will give prn-  Hydralazine for very elevated Bps.  Will add 25 mg q6 hours for SBP >180 or DBP >110.   1/27- BP 160s/81 this AM-will add  hydralazine scheduled for BP 10 mg TID  1/28- BP 169/87- added Hydralazine 10 mg TID yesterday- might need to increase over weekend.  Vitals:   09/29/20 2206 09/30/20 0504  BP: 118/66 (!) 155/83  Pulse:  (!) 58  Resp:  18  Temp:  98 F (36.7 C)  SpO2:  91%  still elevated will  increase hydralazine to QID- has not used prn 25mg  dose  1/31- BP 150s systolic- doing better- con't regimen  2/1- BP 150s-160s /80s- will increase hydralazine to 25 mg q8 hours for better BP control  2/2- BP running 150s-170s systolic- just increased hydralazine- will give a couple of days  2/3- BP better 130s-140s/70s- con't regimen doing better  2/4= BP 150s/60s at max- but last 118/66 12. Aortic dissection with repair. Follow-up outpatient 13. Obesity. Dietary follow-up 14. Bronchospasms/wheezing  1/26- will add Albuterol nebs q4 hours prn- pt needs to get this AM.   1/27- will encourage nursing to do/ask for if pt has Sx's.  15. Noncardiac CP?  1/31- per Cards, CP this Am was GI related- Troponin agrees with this- will con't to monitor.  16. L AKA  2/1- still having drainage from posterior aspect of incision- and per Surgery PA- and d/w pt- pt was told sutures dissolvable- but will have him f/u with them after d/c.  2/2- Dr 2/31 OK with drainage/sounds like agrees it's a hematoma- con't dressing changes- assured pt it's not bad. Made sure dressing ordered 2x/day.  2/3- less drainage- con't dressings BID   2/4- says drainage has stopped- con't regimen 17. Dispo  2/3- d/c tomorrow Will need f/u with Dr Lajoyce Corners and myself and needs info for prosthetics company.  2/4- d/c today    LOS: 11 days A FACE TO FACE EVALUATION WAS PERFORMED  Peyten Punches 09/30/2020, 8:17 AM

## 2020-09-30 NOTE — Telephone Encounter (Signed)
Pharmacy Transitions of Care Follow-up Telephone Call  Date of discharge: 09/29/20 Discharge Diagnosis:   How have you been since you were released from the hospital? Patient has actually not been released yet but is waiting for discharge. Went over s/sx of bleeding.  Medication changes made at discharge: yes  Medication changes obtained and verified? yes    Medication Accessibility:  Home Pharmacy: [facility] [address] [place]  Was the patient provided with refills on discharged medications? yes  Have all prescriptions been transferred from [facility] to home pharmacy? yes  . Is the patient able to afford medications? yes    Medication Review:  APIXABEN (ELIQUIS)  Apixaban 5 mg BID started on 09/19/20 - Discussed importance of taking medication around the same time everyday  - Advised patient of medications to avoid (NSAIDs, ASA)  - Educated that Tylenol (acetaminophen) will be the preferred analgesic to prevent risk of bleeding  - Emphasized importance of monitoring for signs and symptoms of bleeding (abnormal bruising, prolonged bleeding, nose bleeds, bleeding from gums, discolored urine, black tarry stools)  - Advised patient to alert all providers of anticoagulation therapy prior to starting a new medication or having a procedure    Follow-up Appointments:  F/U with IM with Enobong Newlin on 10/12/20. Instructed to get refills at this visit.  If their condition worsens, is the pt aware to call PCP or go to the Emergency Dept.? yes  Final Patient Assessment: Patient has refills on eliquis but needs refills on all other meds. Will get refills at next f/u. Knows s/sx of bleeding to look for.

## 2020-09-30 NOTE — Progress Notes (Signed)
Patient ID: Connor [name], male   DOB: 20-May-1956, 65 y.o.   MRN: [id number]   Patient [facility] transportation scheduled for 2:30 PM.   Lavera Guise, [place] [phone]

## 2020-10-01 DIAGNOSIS — N183 Chronic kidney disease, stage 3 unspecified: Secondary | ICD-10-CM

## 2020-10-01 DIAGNOSIS — G8918 Other acute postprocedural pain: Secondary | ICD-10-CM

## 2020-10-01 DIAGNOSIS — I10 Essential (primary) hypertension: Secondary | ICD-10-CM

## 2020-10-01 DIAGNOSIS — D62 Acute posthemorrhagic anemia: Secondary | ICD-10-CM

## 2020-10-01 NOTE — Progress Notes (Signed)
Connor [name] PHYSICAL MEDICINE & REHABILITATION PROGRESS NOTE   Subjective/Complaints: Patient seen sitting up in bed this morning.  He states he slept well overnight.  He is ready for discharge.  He was supposed to be discharged yesterday, however transportation was delayed and assistance as home was not available at that time, discharge canceled.  This morning there was question of discharge with transportation again, however ultimately transportation arranged.  ROS: Denies CP, SOB, N/V/D  Objective:   No results found. No results for input(s): WBC, HGB, HCT, PLT in the last 72 hours. No results for input(s): NA, K, CL, CO2, GLUCOSE, BUN, CREATININE, CALCIUM in the last 72 hours.  Intake/Output Summary (Last 24 hours) at 10/01/2020 1507 Last data filed at 09/30/2020 1941 Gross per 24 hour  Intake --  Output 650 ml  Net -650 ml        Physical Exam: Vital Signs Blood pressure 135/79, pulse (!) 56, temperature 97.9 F (36.6 C), resp. rate 20, height 5\' 11"  (1.803 m), weight 113.2 kg, SpO2 96 %. Constitutional: No distress . Vital signs reviewed. HENT: Normocephalic.  Atraumatic. Eyes: EOMI. No discharge. Cardiovascular: No JVD.  RRR. Respiratory: Normal effort.  No stridor.  Bilateral clear to auscultation. GI: Non-distended.  BS +. Skin: Warm and dry.   Left AKA with dressing CDI Psych: Normal mood.  Normal behavior. Musc: Left AKA with edema and tenderness, improving Neurologic: Alert Motor: Left hip flexion 4/5 (some pain inhibition)   Assessment/Plan: 1. Functional deficits which require 3+ hours per day of interdisciplinary therapy in a comprehensive inpatient rehab setting.  Physiatrist is providing close team supervision and 24 hour management of active medical problems listed below.  Physiatrist and rehab team continue to assess barriers to discharge/monitor patient progress toward functional and medical goals  Care Tool:  Bathing    Body parts bathed by  patient: Right arm,Left arm,Chest,Abdomen,Front perineal area,Right upper leg,Buttocks,Left upper leg,Right lower leg,Face   Body parts bathed by helper: Right lower leg Body parts n/a: Left lower leg   Bathing assist Assist Level: Set up assist     Upper Body Dressing/Undressing Upper body dressing   What is the patient wearing?: Hospital gown only    Upper body assist Assist Level: Set up assist Assistive Device Comment: no other clothing available and patient declines paper scrub top  Lower Body Dressing/Undressing Lower body dressing      What is the patient wearing?: Pants     Lower body assist Assist for lower body dressing: Set up assist     Toileting Toileting    Toileting assist Assist for toileting: Set up assist     Transfers Chair/bed transfer  Transfers assist     Chair/bed transfer assist level: Supervision/Verbal cueing Chair/bed transfer assistive device:   Ambulation assist      Assist level: Contact Guard/Touching assist Assistive device: Walker-rolling Max distance: 30   Walk 10 feet activity   Assist     Assist level: Contact Guard/Touching assist Assistive device: Walker-rolling   Walk 50 feet activity   Assist Walk 50 feet with 2 turns activity did not occur: Safety/medical concerns         Walk 150 feet activity   Assist Walk 150 feet activity did not occur: Safety/medical concerns         Walk 10 feet on uneven surface  activity   Assist Walk 10 feet on uneven surfaces activity did not occur: Safety/medical concerns  Wheelchair     Assist Will patient use wheelchair at discharge?: Yes Type of Wheelchair: Manual    Wheelchair assist level: Independent Max wheelchair distance: 250    Wheelchair 50 feet with 2 turns activity    Assist        Assist Level: Independent   Wheelchair 150 feet activity     Assist      Assist Level: Independent    Blood pressure 135/79, pulse (!) 56, temperature 97.9 F (36.6 C), resp. rate 20, height 5\' 11"  (1.803 m), weight 113.2 kg, SpO2 96 %.  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 Antithrombotics: -DVT/anticoagulation:Chronic Eliquis has been resumed. -antiplatelet therapy:  3. Pain Management:Voltaren gel 4 times daily, Robaxin 1000 mg every 6 hours as needed  Gabapentin for phantom pain  Changed oxycodone to q4 hours prn 10 mg- and add Tramadol 50 mg q6 hours prn.   oxycontin to 30 mg BID for the moment  Started cymbalta 60mg  qhs on 1/29  Controlled on 2/5 4. Mood:Provide emotional support -antipsychotic agents: N/A 5. Neuropsych: This patientiscapable of making decisions on hisown behalf. 6. Skin/Wound Care:Routine skin checks 7. Fluids/Electrolytes/Nutrition:Routine in and outs 8. Severe normocytic anemia. Follow-up hematology services  Hemoglobin 8.2 on 2/1 9. Atrial fibrillation. Cardizem 240 mg daily, continue Eliquis. 10. 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 Hypertension. Aldactone 25 mg dailyNormodyne 400 mg twice daily. Monitor with increased mobility  Hydralazine   Controlled on 2/5 12. Aortic dissection with repair. Follow-up outpatient 13. Obesity. Dietary follow-up 14. Bronchospasms/wheezing  Improved  15. Noncardiac CP?  Resolved  LOS: 12 days A FACE TO FACE EVALUATION WAS PERFORMED  Misa Fedorko 10/01/2020, 3:07 PM

## 2020-10-01 NOTE — Progress Notes (Signed)
Patient discharged from facility via [facility]. [facility] was provided discharge transport paper work.  [employer], LPN

## 2020-10-04 ENCOUNTER — Telehealth: Payer: Self-pay

## 2020-10-04 NOTE — Telephone Encounter (Signed)
Transitional Care Call--who you spoke with Mr. Connor [name].   1. Are you/is patient experiencing any problems since coming home? Problem with ADL's and mobility. Cousin is helping and things are getting better.  Are there any questions regarding any aspect of care?  No medical supplies at home. No wound care bandages. No one to go and pick-up supplies.  2. Are there any questions regarding medications administration/dosing? Patient advised to take medication as prescribed.  Medications reviewed.  Are meds being taken as prescribed? Yes. Patient should review meds with caller to confirm. Done 3. Have there been any falls? No.  4. Has Home Health been to the house and/or have they contacted you? Yes they called, but waiting on a call back.  If not, have you tried to contact them? No. Patient given [facility] phone number for follow up. Advised  to call today ph# [phone]. Can we help you contact them? Information given.  5. Are bowels and bladder emptying properly?  Yes. No problem. Are there any unexpected incontinence issues? No. If applicable, is patient following bowel/bladder programs? N/A 6. Any fevers,. No, but complains of chills & sweats since being home.  Problems with breathing when laying flat. Unexpected pain? No. Are there any skin problems or new areas of breakdown? No.  7. Has the patient/family member arranged specialty MD follow up (ie cardiology/neurology/renal/surgical/etc)? Patient give all phone numbers of follow up providers.  Can we help arrange? He will call and make arrangements or appointments. He has no transportation at this time. He has been advised to call his Medicaid Social Worker to see if there are other options for transport.  8. Does the patient need any other services or support that we can help arrange? No.  9. Are caregivers following through as expected in assisting the patient? Yes.         11. Has the patient quit smoking, drinking alcohol, or using drugs as  recommended? Per patient he does does indulge.  Appointment Date/Time/ Arrival time/ and who they are seeing [address]

## 2020-10-05 ENCOUNTER — Inpatient Hospital Stay (HOSPITAL_COMMUNITY)
Admission: EM | Admit: 2020-10-05 | Discharge: 2020-10-10 | DRG: 392 | Disposition: A | Discharge status: Home Health | Payer: Medicaid Other | Attending: Internal Medicine | Admitting: Internal Medicine

## 2020-10-05 ENCOUNTER — Other Ambulatory Visit: Payer: Self-pay

## 2020-10-05 ENCOUNTER — Emergency Department (HOSPITAL_COMMUNITY): Payer: Medicaid Other

## 2020-10-05 ENCOUNTER — Encounter (HOSPITAL_COMMUNITY): Payer: Self-pay | Admitting: Emergency Medicine

## 2020-10-05 ENCOUNTER — Telehealth: Payer: Self-pay

## 2020-10-05 ENCOUNTER — Telehealth: Payer: Self-pay | Admitting: Orthopedic Surgery

## 2020-10-05 DIAGNOSIS — Z79899 Other long term (current) drug therapy: Secondary | ICD-10-CM

## 2020-10-05 DIAGNOSIS — I10 Essential (primary) hypertension: Secondary | ICD-10-CM | POA: Diagnosis present

## 2020-10-05 DIAGNOSIS — Z8249 Family history of ischemic heart disease and other diseases of the circulatory system: Secondary | ICD-10-CM

## 2020-10-05 DIAGNOSIS — K2900 Acute gastritis without bleeding: Principal | ICD-10-CM | POA: Diagnosis present

## 2020-10-05 DIAGNOSIS — E8809 Other disorders of plasma-protein metabolism, not elsewhere classified: Secondary | ICD-10-CM | POA: Diagnosis present

## 2020-10-05 DIAGNOSIS — E669 Obesity, unspecified: Secondary | ICD-10-CM | POA: Diagnosis present

## 2020-10-05 DIAGNOSIS — E876 Hypokalemia: Secondary | ICD-10-CM | POA: Diagnosis present

## 2020-10-05 DIAGNOSIS — I440 Atrioventricular block, first degree: Secondary | ICD-10-CM | POA: Diagnosis present

## 2020-10-05 DIAGNOSIS — R0789 Other chest pain: Secondary | ICD-10-CM

## 2020-10-05 DIAGNOSIS — R079 Chest pain, unspecified: Secondary | ICD-10-CM

## 2020-10-05 DIAGNOSIS — K2970 Gastritis, unspecified, without bleeding: Secondary | ICD-10-CM

## 2020-10-05 DIAGNOSIS — R1013 Epigastric pain: Secondary | ICD-10-CM

## 2020-10-05 DIAGNOSIS — N183 Chronic kidney disease, stage 3 unspecified: Secondary | ICD-10-CM | POA: Diagnosis present

## 2020-10-05 DIAGNOSIS — I480 Paroxysmal atrial fibrillation: Secondary | ICD-10-CM

## 2020-10-05 DIAGNOSIS — Z20822 Contact with and (suspected) exposure to covid-19: Secondary | ICD-10-CM | POA: Diagnosis present

## 2020-10-05 DIAGNOSIS — N182 Chronic kidney disease, stage 2 (mild): Secondary | ICD-10-CM | POA: Diagnosis present

## 2020-10-05 DIAGNOSIS — I4819 Other persistent atrial fibrillation: Secondary | ICD-10-CM | POA: Diagnosis present

## 2020-10-05 DIAGNOSIS — Z951 Presence of aortocoronary bypass graft: Secondary | ICD-10-CM

## 2020-10-05 DIAGNOSIS — Z7901 Long term (current) use of anticoagulants: Secondary | ICD-10-CM

## 2020-10-05 DIAGNOSIS — Z89612 Acquired absence of left leg above knee: Secondary | ICD-10-CM

## 2020-10-05 DIAGNOSIS — F17210 Nicotine dependence, cigarettes, uncomplicated: Secondary | ICD-10-CM | POA: Diagnosis present

## 2020-10-05 DIAGNOSIS — I1310 Hypertensive heart and chronic kidney disease without heart failure, with stage 1 through stage 4 chronic kidney disease, or unspecified chronic kidney disease: Secondary | ICD-10-CM | POA: Diagnosis present

## 2020-10-05 DIAGNOSIS — I444 Left anterior fascicular block: Secondary | ICD-10-CM | POA: Diagnosis present

## 2020-10-05 DIAGNOSIS — Z6833 Body mass index (BMI) 33.0-33.9, adult: Secondary | ICD-10-CM

## 2020-10-05 DIAGNOSIS — E8770 Fluid overload, unspecified: Secondary | ICD-10-CM | POA: Diagnosis present

## 2020-10-05 DIAGNOSIS — N179 Acute kidney failure, unspecified: Secondary | ICD-10-CM | POA: Diagnosis present

## 2020-10-05 DIAGNOSIS — E441 Mild protein-calorie malnutrition: Secondary | ICD-10-CM | POA: Diagnosis present

## 2020-10-05 MED ORDER — LACTATED RINGERS IV BOLUS
1000.0000 mL | Freq: Once | INTRAVENOUS | Status: AC
  Administered 2020-10-06: 1000 mL via INTRAVENOUS

## 2020-10-05 MED ORDER — HYDROMORPHONE HCL 1 MG/ML IJ SOLN
1.0000 mg | Freq: Once | INTRAMUSCULAR | Status: AC
  Administered 2020-10-06: 1 mg via INTRAVENOUS
  Filled 2020-10-05: qty 1

## 2020-10-05 MED ORDER — ASPIRIN 81 MG PO CHEW
324.0000 mg | CHEWABLE_TABLET | Freq: Once | ORAL | Status: AC
  Administered 2020-10-06: 324 mg via ORAL
  Filled 2020-10-05: qty 4

## 2020-10-05 MED ORDER — ONDANSETRON HCL 4 MG/2ML IJ SOLN
4.0000 mg | Freq: Once | INTRAMUSCULAR | Status: AC
  Administered 2020-10-06: 4 mg via INTRAVENOUS
  Filled 2020-10-05: qty 2

## 2020-10-05 NOTE — Telephone Encounter (Signed)
Connor [name] with [facility] called:  The first nursing visit was completed on yesterday 10/05/2019. Future orders okay for 2 times a week for 4 weeks, then once a week for 3 weeks.   To include a Office manager. Discharge summary note reviewed. Patient also scheduled to receive PT & OT.     Per Connor [name] his living conditions are not the best. He  does not have adequate home heating or medical supplies.

## 2020-10-05 NOTE — Telephone Encounter (Signed)
We discussed this- getting him a Child psychotherapist ot help with heating, wound care supplies, hopefully.

## 2020-10-05 NOTE — Telephone Encounter (Signed)
Aneta Mins with PT called to get verbal orders approved with the frequency of twice a week for three weeks and then once a week for 1 week; he also mentioned wanting to do OT for once a week for 1 week.  Aneta Mins CB# [phone] * Ok to LVM

## 2020-10-05 NOTE — ED Triage Notes (Signed)
Pt c/o central chest pain with vomiting x 1 hour.

## 2020-10-06 ENCOUNTER — Emergency Department (HOSPITAL_COMMUNITY): Payer: Medicaid Other

## 2020-10-06 ENCOUNTER — Other Ambulatory Visit (HOSPITAL_COMMUNITY): Payer: Medicaid Other

## 2020-10-06 DIAGNOSIS — R0789 Other chest pain: Secondary | ICD-10-CM | POA: Diagnosis not present

## 2020-10-06 DIAGNOSIS — N183 Chronic kidney disease, stage 3 unspecified: Secondary | ICD-10-CM

## 2020-10-06 DIAGNOSIS — Z89612 Acquired absence of left leg above knee: Secondary | ICD-10-CM | POA: Diagnosis not present

## 2020-10-06 DIAGNOSIS — I10 Essential (primary) hypertension: Secondary | ICD-10-CM

## 2020-10-06 DIAGNOSIS — E876 Hypokalemia: Secondary | ICD-10-CM

## 2020-10-06 DIAGNOSIS — E8809 Other disorders of plasma-protein metabolism, not elsewhere classified: Secondary | ICD-10-CM | POA: Diagnosis not present

## 2020-10-06 LAB — COMPREHENSIVE METABOLIC PANEL
ALT: 17 U/L (ref 0–44)
AST: 19 U/L (ref 15–41)
Albumin: 3.3 g/dL — ABNORMAL LOW (ref 3.5–5.0)
Alkaline Phosphatase: 98 U/L (ref 38–126)
Anion gap: 10 (ref 5–15)
BUN: 16 mg/dL (ref 8–23)
CO2: 24 mmol/L (ref 22–32)
Calcium: 8.9 mg/dL (ref 8.9–10.3)
Chloride: 102 mmol/L (ref 98–111)
Creatinine, Ser: 1.29 mg/dL — ABNORMAL HIGH (ref 0.61–1.24)
GFR, Estimated: >60 mL/min (ref >60)
Glucose, Bld: 124 mg/dL — ABNORMAL HIGH (ref 70–99)
Potassium: 2.9 mmol/L — ABNORMAL LOW (ref 3.5–5.1)
Sodium: 136 mmol/L (ref 135–145)
Total Bilirubin: 0.8 mg/dL (ref 0.3–1.2)
Total Protein: 8.4 g/dL — ABNORMAL HIGH (ref 6.5–8.1)

## 2020-10-06 LAB — CBC
HCT: 31.9 % — ABNORMAL LOW (ref 39.0–52.0)
Hemoglobin: 9.9 g/dL — ABNORMAL LOW (ref 13.0–17.0)
MCH: 29.1 pg (ref 26.0–34.0)
MCHC: 31 g/dL (ref 30.0–36.0)
MCV: 93.8 fL (ref 80.0–100.0)
Platelets: 304 10*3/uL (ref 150–400)
RBC: 3.4 MIL/uL — ABNORMAL LOW (ref 4.22–5.81)
RDW: 15.6 % — ABNORMAL HIGH (ref 11.5–15.5)
WBC: 10.4 10*3/uL (ref 4.0–10.5)
nRBC: 0 % (ref 0.0–0.2)

## 2020-10-06 LAB — LIPASE, BLOOD: Lipase: 22 U/L (ref 11–51)

## 2020-10-06 LAB — SARS CORONAVIRUS 2 (TAT 6-24 HRS): SARS Coronavirus 2: NEGATIVE

## 2020-10-06 LAB — TROPONIN I (HIGH SENSITIVITY)
Troponin I (High Sensitivity): 12 ng/L (ref <18)
Troponin I (High Sensitivity): 12 ng/L (ref <18)

## 2020-10-06 LAB — PHOSPHORUS: Phosphorus: 4.4 mg/dL (ref 2.5–4.6)

## 2020-10-06 LAB — MAGNESIUM: Magnesium: 1.7 mg/dL (ref 1.7–2.4)

## 2020-10-06 MED ORDER — MAGNESIUM SULFATE 2 GM/50ML IV SOLN
2.0000 g | Freq: Once | INTRAVENOUS | Status: AC
  Administered 2020-10-06: 2 g via INTRAVENOUS
  Filled 2020-10-06: qty 50

## 2020-10-06 MED ORDER — HYDROMORPHONE HCL 1 MG/ML IJ SOLN
1.0000 mg | Freq: Once | INTRAMUSCULAR | Status: AC
  Administered 2020-10-06: 1 mg via INTRAVENOUS
  Filled 2020-10-06: qty 1

## 2020-10-06 MED ORDER — SPIRONOLACTONE 25 MG PO TABS
25.0000 mg | ORAL_TABLET | Freq: Every day | ORAL | Status: DC
  Administered 2020-10-06 – 2020-10-08 (×3): 25 mg via ORAL
  Filled 2020-10-06 (×3): qty 1

## 2020-10-06 MED ORDER — FUROSEMIDE 10 MG/ML IJ SOLN
40.0000 mg | Freq: Once | INTRAMUSCULAR | Status: AC
  Administered 2020-10-06: 40 mg via INTRAVENOUS
  Filled 2020-10-06: qty 4

## 2020-10-06 MED ORDER — OXYCODONE HCL 5 MG PO TABS: 5.0000 mg | ORAL_TABLET | Freq: Four times a day (QID) | ORAL | Status: DC | PRN

## 2020-10-06 MED ORDER — APIXABAN 5 MG PO TABS
5.0000 mg | ORAL_TABLET | Freq: Two times a day (BID) | ORAL | Status: DC
  Administered 2020-10-06 – 2020-10-10 (×9): 5 mg via ORAL
  Filled 2020-10-06 (×9): qty 1

## 2020-10-06 MED ORDER — ENOXAPARIN SODIUM 40 MG/0.4ML ~~LOC~~ SOLN
40.0000 mg | SUBCUTANEOUS | Status: DC
  Filled 2020-10-06: qty 0.4

## 2020-10-06 MED ORDER — POTASSIUM CHLORIDE CRYS ER 20 MEQ PO TBCR: 20.0000 meq | EXTENDED_RELEASE_TABLET | Freq: Once | ORAL | Status: DC

## 2020-10-06 MED ORDER — POLYETHYLENE GLYCOL 3350 17 G PO PACK
17.0000 g | PACK | Freq: Every day | ORAL | Status: DC
  Administered 2020-10-09: 17 g via ORAL
  Filled 2020-10-06 (×4): qty 1

## 2020-10-06 MED ORDER — OXYCODONE HCL ER 10 MG PO T12A
20.0000 mg | EXTENDED_RELEASE_TABLET | Freq: Two times a day (BID) | ORAL | Status: DC
  Administered 2020-10-06 (×2): 20 mg via ORAL
  Filled 2020-10-06 (×2): qty 2

## 2020-10-06 MED ORDER — ONDANSETRON HCL 4 MG/2ML IJ SOLN
4.0000 mg | Freq: Four times a day (QID) | INTRAMUSCULAR | Status: DC | PRN
  Administered 2020-10-09: 4 mg via INTRAVENOUS
  Filled 2020-10-06 (×2): qty 2

## 2020-10-06 MED ORDER — POTASSIUM CHLORIDE 10 MEQ/100ML IV SOLN
10.0000 meq | INTRAVENOUS | Status: AC
Start: 2020-10-06 — End: 2020-10-06
  Administered 2020-10-06 (×2): 10 meq via INTRAVENOUS
  Filled 2020-10-06 (×2): qty 100

## 2020-10-06 MED ORDER — DILTIAZEM HCL ER COATED BEADS 120 MG PO CP24
240.0000 mg | ORAL_CAPSULE | Freq: Every day | ORAL | Status: DC
  Administered 2020-10-06 – 2020-10-10 (×5): 240 mg via ORAL
  Filled 2020-10-06 (×2): qty 2
  Filled 2020-10-06: qty 1
  Filled 2020-10-06 (×2): qty 2

## 2020-10-06 MED ORDER — GABAPENTIN 400 MG PO CAPS
400.0000 mg | ORAL_CAPSULE | Freq: Three times a day (TID) | ORAL | Status: DC
  Administered 2020-10-06 – 2020-10-10 (×11): 400 mg via ORAL
  Filled 2020-10-06 (×12): qty 1

## 2020-10-06 MED ORDER — OXYCODONE HCL ER 10 MG PO T12A: 20.0000 mg | EXTENDED_RELEASE_TABLET | Freq: Two times a day (BID) | ORAL | Status: DC

## 2020-10-06 MED ORDER — HYDRALAZINE HCL 20 MG/ML IJ SOLN
5.0000 mg | Freq: Once | INTRAMUSCULAR | Status: AC
  Administered 2020-10-06: 5 mg via INTRAVENOUS
  Filled 2020-10-06: qty 1

## 2020-10-06 MED ORDER — OXYCODONE HCL 5 MG PO TABS
5.0000 mg | ORAL_TABLET | Freq: Four times a day (QID) | ORAL | Status: DC | PRN
  Administered 2020-10-06 – 2020-10-07 (×4): 5 mg via ORAL
  Filled 2020-10-06 (×5): qty 1

## 2020-10-06 MED ORDER — POTASSIUM CHLORIDE CRYS ER 20 MEQ PO TBCR
40.0000 meq | EXTENDED_RELEASE_TABLET | Freq: Once | ORAL | Status: DC
  Filled 2020-10-06: qty 2

## 2020-10-06 MED ORDER — IOHEXOL 350 MG/ML SOLN
100.0000 mL | Freq: Once | INTRAVENOUS | Status: AC | PRN
  Administered 2020-10-06: 100 mL via INTRAVENOUS

## 2020-10-06 MED ORDER — ENSURE ENLIVE PO LIQD
237.0000 mL | Freq: Two times a day (BID) | ORAL | Status: DC
  Administered 2020-10-07 – 2020-10-10 (×6): 237 mL via ORAL
  Filled 2020-10-06 (×4): qty 237

## 2020-10-06 MED ORDER — HYDRALAZINE HCL 25 MG PO TABS
25.0000 mg | ORAL_TABLET | Freq: Three times a day (TID) | ORAL | Status: DC
  Administered 2020-10-06 – 2020-10-10 (×12): 25 mg via ORAL
  Filled 2020-10-06 (×12): qty 1

## 2020-10-06 MED ORDER — ADULT MULTIVITAMIN W/MINERALS CH
1.0000 | ORAL_TABLET | Freq: Every day | ORAL | Status: DC
  Administered 2020-10-06 – 2020-10-10 (×5): 1 via ORAL
  Filled 2020-10-06 (×5): qty 1

## 2020-10-06 MED ORDER — NITROGLYCERIN IN D5W 200-5 MCG/ML-% IV SOLN
5.0000 ug/min | INTRAVENOUS | Status: DC
  Administered 2020-10-06: 5 ug/min via INTRAVENOUS
  Filled 2020-10-06: qty 250

## 2020-10-06 MED ORDER — LABETALOL HCL 200 MG PO TABS
400.0000 mg | ORAL_TABLET | Freq: Two times a day (BID) | ORAL | Status: DC
  Administered 2020-10-06 – 2020-10-10 (×9): 400 mg via ORAL
  Filled 2020-10-06 (×9): qty 2

## 2020-10-06 NOTE — ED Notes (Signed)
Pt to Xray.

## 2020-10-06 NOTE — ED Notes (Signed)
Patient transported to CT 

## 2020-10-06 NOTE — ED Notes (Signed)
Vomited x 3 - medications taken po visible in basin. MD notified

## 2020-10-06 NOTE — ED Notes (Signed)
Sitting with pt. Monitoring BP at this time. Will titrate drip as needed. Will continue to monitor.

## 2020-10-06 NOTE — ED Notes (Signed)
Pt to CT

## 2020-10-06 NOTE — ED Provider Notes (Addendum)
Emergency Department Provider Note  I have reviewed the triage vital signs and the nursing notes.  HISTORY  Chief Complaint Chest Pain   HPI Connor [name] is a 65 y.o. male with multiple past medical problems to include hypertension, aortic dissection status post repair couple years ago and recent hospital stay for sepsis secondary to neck fashion of his leg status post AKA.  Patient states that earlier tonight he had acute onset of left-sided chest and abdominal pain.  Patient states that this has been severe in nature has vomited a couple times with it.  Patient states is worsened over the last hour or so presents here for further evaluation.  History is difficult to obtain as the patient seems to be portray in a large amount of pain.   No other associated or modifying symptoms.    Past Medical History:  Diagnosis Date  . AAA (abdominal aortic aneurysm) (HCC)   . History of open heart surgery   . Hypertension   . Seroma due to trauma [facility])     Patient Active Problem List   Diagnosis Date Noted  . Acute blood loss anemia   . Essential hypertension   . Postoperative pain   . Severe anemia 09/19/2020  . History of left above knee amputation (HCC) 09/19/2020  . Left above-knee amputee (HCC) 09/19/2020  . 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 CKD (chronic kidney disease), stage III (HCC) 09/01/2020  . Morbid obesity (HCC) 09/01/2020  . Severe sepsis with acute organ dysfunction due to Streptococcus species (HCC) 08/30/2020  . Cellulitis of left leg 08/30/2020  . Lactic acidosis 08/30/2020  . Elevated troponin 08/30/2020  . Hypoalbuminemia 08/30/2020  . Leukocytosis 08/30/2020  . Thrombocytopenia (HCC) 08/30/2020  . Hypokalemia 08/30/2020  . Hyperglycemia 08/30/2020  . Transaminitis 08/30/2020  . Total bilirubin, elevated 08/30/2020  . AKI (acute kidney injury) (HCC) 08/30/2020  . Nausea vomiting and diarrhea 08/30/2020  . Hypertension  05/20/2020  . Persistent atrial fibrillation [facility])     Past Surgical History:  Procedure Laterality Date  . ABDOMINAL AORTIC ANEURYSM REPAIR    . AMPUTATION Left 09/07/2020   Procedure: ATTEMPTED LEFT LEG DEBRIDEMENT FASCIOTOMIES, APPLY INSTILLATION WOUND VAC, ABOVE KNEE AMPUTATION;  Surgeon: Nadara Mustard, MD;  Location: [facility] OR;  Service: Orthopedics;  Laterality: Left;  . BUBBLE STUDY  09/05/2020   Procedure: BUBBLE STUDY;  Surgeon: Meriam Sprague, MD;  Location: [facility] ENDOSCOPY;  Service: Cardiovascular;;  . CARDIOVERSION N/A 02/11/2020   Procedure: CARDIOVERSION;  Surgeon: Little Ishikawa, MD;  Location: [facility] ENDOSCOPY;  Service: Cardiovascular;  Laterality: N/A;  . CORONARY ARTERY BYPASS GRAFT    . STUMP REVISION Left 09/09/2020   Procedure: REVISION LEFT ABOVE KNEE AMPUTATION;  Surgeon: Nadara Mustard, MD;  Location: [facility] OR;  Service: Orthopedics;  Laterality: Left;  . TEE WITHOUT CARDIOVERSION N/A 09/05/2020   Procedure: TRANSESOPHAGEAL ECHOCARDIOGRAM (TEE);  Surgeon: Meriam Sprague, MD;  Location: [facility] ENDOSCOPY;  Service: Cardiovascular;  Laterality: N/A;    Current Outpatient Rx  . Order #: [id number] Class: Normal  . Order #: [id number] Class: Normal  . Order #: [id number] Class: Normal  . Order #: [id number] Class: Normal  . Order #: [id number] Class: Normal  . Order #: [id number] Class: Normal  . Order #: [id number] Class: Normal  . Order #: [id number] Class: Historical Med  . Order #: [id number] Class: Normal  . Order #: [id number] Class: Normal  . Order #: [id number] Class: Print  . Order #: [id number] Class: Normal    Allergies Patient has  no known allergies.  Family History  Problem Relation Age of Onset  . CAD Mother   . CVA Father     Social History Social History   Tobacco Use  . Smoking status: Current Every Day Smoker    Packs/day: 0.50  . Smokeless tobacco: Never Used  Vaping Use  . Vaping Use: Never used  Substance Use Topics  . Alcohol use: Never  . Drug  use: Never    Review of Systems  All other systems negative except as documented in the HPI. All pertinent positives and negatives as reviewed in the HPI. ____________________________________________  PHYSICAL EXAM:  VITAL SIGNS: ED Triage Vitals  Enc Vitals Group     BP 10/05/20 2325 (!) 156/89     Pulse Rate 10/05/20 2325 (!) 50     Resp 10/05/20 2325 (!) 24     Temp 10/05/20 2325 (!) 97.5 F (36.4 C)     Temp Source 10/05/20 2325 Oral     SpO2 10/05/20 2325 97 %     Weight 10/05/20 2322 240 lb (108.9 kg)     Height 10/05/20 2322 5\' 11"  (1.803 m)    Constitutional: Alert and oriented. Well appearing and in no acute distress. Eyes: Conjunctivae are normal. PERRL. EOMI. Head: Atraumatic. Nose: No congestion/rhinnorhea. Mouth/Throat: Mucous membranes are dry.  Oropharynx non-erythematous. Neck: No stridor.  No meningeal signs.   Cardiovascular: Normal rate, regular rhythm. Good peripheral circulation. Grossly normal heart sounds.  Has a DP pulse in his right foot. Respiratory: Normal respiratory effort.  No retractions. Lungs CTAB. Gastrointestinal: Soft and nontender. No distention.  Musculoskeletal: No lower extremity tenderness nor edema.  Left AKA Neurologic:  Normal speech and language. No gross focal neurologic deficits are appreciated.  Skin:  Skin is warm, dry and intact. No rash noted.  ____________________________________________   LABS (all labs ordered are listed, but only abnormal results are displayed)  Labs Reviewed  COMPREHENSIVE METABOLIC PANEL  CBC  LIPASE, BLOOD  CBG MONITORING, ED  TROPONIN I (HIGH SENSITIVITY)   ____________________________________________  EKG   EKG Interpretation  Date/Time:  Wednesday October 05 2020 23:15:48 EST Ventricular Rate:  51 PR Interval:  212 QRS Duration: 102 QT Interval:  470 QTC Calculation: 433 R Axis:   -69 Text Interpretation: Sinus bradycardia with 1st degree A-V block Pulmonary disease pattern  RSR' or QR pattern in V1 suggests right ventricular conduction delay Left anterior fascicular block Abnormal ECG rate similar No acute changes from january Confirmed by February [phone]) on 10/06/2020 12:08:03 AM       ____________________________________________  RADIOLOGY  No results found. ____________________________________________  PROCEDURES  Procedure(s) performed:   .Critical Care Performed by: 12/04/2020, MD Authorized by: Marily Memos, MD   Critical care provider statement:    Critical care time (minutes):  45   Critical care was necessary to treat or prevent imminent or life-threatening deterioration of the following conditions:  Circulatory failure and CNS failure or compromise (hypertensive urgency)   Critical care was time spent personally by me on the following activities:  Discussions with consultants, evaluation of patient's response to treatment, examination of patient, ordering and performing treatments and interventions, ordering and review of laboratory studies, ordering and review of radiographic studies, pulse oximetry, re-evaluation of patient's condition, obtaining history from patient or surrogate and review of old charts   ____________________________________________  INITIAL IMPRESSION / ASSESSMENT AND PLAN    This patient presents to the ED for concern of chest pain, this involves an  extensive number of treatment options, and is a complaint that carries with it a high risk of complications and morbidity.  The differential diagnosis includes recurrent aortic dissection, pulmonary bliss, ACS, pneumothorax, pneumonia, dyspepsia, splenic infarct.  Additional history obtained:   Additional history obtained from no one  Previous records obtained and reviewed in epic  ED Course  Clinical Course as of 10/06/20 0433  Thu Oct 06, 2020  0417 CT Angio Chest/Abd/Pel for Dissection W and/or Wo Contrast Reviewed but difficult to interpret secondary  to his surgical history. Radiology interpretation is reassurring.  [JM]  0418 Lipase, blood [JM]  0418 Troponin I (High Sensitivity) Reassuring not ACS as ECG is also unremarkable [JM]  0418 After rapid titration of Nitroglycerin, hydralazine and pain control chest pain has started to improve. I talked to patient and sounds like he stopped his narcotics yesterday, could be withdrawal? But no n/v/d, diaphoresis or tachcyardia. Will admit for BP control and reevaluation and further management.  [JM]    Clinical Course User Index [JM] Mesner, Barbara Cower, MD     Medicines ordered:  Medications  aspirin chewable tablet 324 mg (has no administration in time range)  HYDROmorphone (DILAUDID) injection 1 mg (has no administration in time range)  lactated ringers bolus 1,000 mL (has no administration in time range)  ondansetron (ZOFRAN) injection 4 mg (has no administration in time range)     Consultations Obtained:   I consulted Hospitalist, Dr. Thomes Dinning, and discussed lab and imaging findings. I think his aneurysm is stable at this time on CT scan, no indication for CT surgery consult.    CRITICAL INTERVENTIONS:  . Nitroglycerin infusion for BP control . Rapid evaluation and BP control with h/o thoracoabdominal aneurysm  Reevaluation:  After the interventions stated above, I reevaluated the patient and found improving but persistent pain.   FINAL IMPRESSION AND PLAN Final diagnoses:  None    Medical screening exam was performed and I feel the patient has had appropriate emergency department evaluation and work-up for their chief complaint and is stable for ADMISSION to the hospitalist time.  I discussed with Dr. Thomes Dinning with the hospuitalist service and discussed labs, imaging and other work-up in the emergency room.  They agree to admission for further management and work-up of said condition. ____________________________________________   NEW OUTPATIENT MEDICATIONS STARTED DURING THIS  VISIT:  New Prescriptions   No medications on file    Note:  This note was prepared with assistance of Dragon voice recognition software. Occasional wrong-word or sound-a-like substitutions may have occurred due to the inherent limitations of voice recognition software.   Mesner, Barbara Cower, MD 10/06/20 2094    Marily Memos, MD 10/06/20 [phone]

## 2020-10-06 NOTE — ED Notes (Addendum)
Refused po K - reports he hates taking potassium pills, "give it through my IV" education provided but continued to refuse Refused lovenox - reports he's on Eliquis and and does not need anything else

## 2020-10-06 NOTE — Telephone Encounter (Signed)
Pt is s/p a revision left AKA on 09/09/20 and has not had follow up in the office since discharge. Lm on vm to advise that we need to see him in office before we can write orders. Can you please reach out to the pt and try to set up appt for next week?

## 2020-10-06 NOTE — Progress Notes (Signed)
PROGRESS NOTE    Connor [name]  TIR:[id number] DOB: 10/27/1955 DOA: 10/05/2020 PCP: Hoy Register, MD    Brief Narrative:  Connor [name] was admitted to the hospital with the working diagnosis of atypical chest pain.  65 year old male past medical history for aortic dissection, status post repair, atrial fibrillation, coronary artery disease status post bypass grafting, chronic kidney disease, hypertension and obesity.  Reported left-sided sternal chest pain, dull in nature, 2-5/10 in intensity, with radiation to both shoulders.  No associated symptoms. Recent hospitalization 01/4 to 01/24 for severe sepsis due to necrotizing fasciitis, Streptococcus pyogenes, status post left AKA.  On his initial physical examination blood pressure 106/67, heart rate 50, temperature 97.5, respiratory rate 21, oxygen saturation 96% Lungs clear to auscultation bilaterally, heart S1-S2, present rhythmic, soft abdomen, left AKA.   Sodium 136, potassium 2.9, chloride 102, bicarb 24, glucose 124, BUN 16, creatinine 1.29, magnesium 1.7, troponin I 12-12, White count 10.4, hemoglobin 9.9, hematocrit 31.9, platelets 304. SARS COVID-19 pending. Chest x-ray with hilar vascular congestion. CT chest/abdomen and pelvis with bilateral groundglass opacities, predominantly right lower lobe. No dissection.   EKG 51 bpm, left axis deviation, left anterior fascicular block, first-degree AV block, sinus rhythm, no ST segment or T wave changes.  Assessment & Plan:   Principal Problem:   Atypical chest pain Active Problems:   Persistent atrial fibrillation (HCC)   Hypertension   Hypoalbuminemia   Hypokalemia   CKD (chronic kidney disease), stage III (HCC)   History of left above knee amputation (HCC)   1. Atypical chest pain. Patient continue to have chest pain, mainly on deep inspiration. No nausea or vomiting, positive dyspnea.  Echocardiogram from 08/2020 with preserved LV systolic function 60 to 65% with elevated  pulmonary pressure 34 mmHg.  Positive bubble study, intrapulmonary shunting. Bilateral atrial enlargement.   Chest film with vascular congestion, CT with bilateral ground glass opacities suggestive of cardiogenic pulmonary edema.  Add furosemide 40 mg Iv x1 to target negative fluid balanced, follow strict in and out.   2. Paroxysmal atrial fibrillation. Rate is controlled, continue anticoagulation with apixaban.  Continue with diltiazem for rate control.   3. HTN. Blood pressure this am 139 to 118 mmHg systolic. Resume antihypertensive medications hydralazine, labetalol and spironolactone,   4. Obesity class 1. Calculated BMI is 33,4.  5. Mild protein calorie malnutrition. Consult dietary for recommendations.   6. 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 K is 2,9 and Mg 1,7 this am, continue K correction with Kcl 40 meq x 2 doses and add Iv mag 2 g.   Old records personally reviewed noted cr up to 7,17 on 08/2020.   Add one dose of furosemide and follow up on renal function in am.   Patient continue to be at high risk for worsening chest pain.   Status is: Observation  The patient remains OBS appropriate and will d/c before 2 midnights.  Dispo: The patient is from: Home              Anticipated d/c is to: Home              Anticipated d/c date is: 1 day              Patient currently is not medically stable to d/c.   Difficult to place patient No    DVT prophylaxis: apixaban   Code Status:    full  Family Communication:  No family at the bedside  Subjective: Patient feeling better, no nausea or vomiting, continue to have chest pain and dyspnea.   Objective: Vitals:   10/06/20 0800 10/06/20 0806 10/06/20 0815 10/06/20 0840  BP:  124/71 112/74 117/78  Pulse:    (!) 55  Resp: 18 17 17 18   Temp:      TempSrc:      SpO2:   99% 98%  Weight:      Height:        Intake/Output Summary (Last 24 hours) at 10/06/2020 1051 Last data filed at 10/06/2020  0836 Gross per 24 hour  Intake 1248.85 ml  Output --  Net 1248.85 ml   Filed Weights   10/05/20 2322  Weight: 108.9 kg    Examination:   General: Not in pain or dyspnea. Deconditioned  Neurology: Awake and alert, non focal  E ENT: no pallor, no icterus, oral mucosa moist Cardiovascular: No JVD. S1-S2 present, rhythmic, no gallops, rubs, or murmurs. No lower extremity edema. Pulmonary: positive breath sounds bilaterally, no wheezing, bilateral rales. Gastrointestinal. Abdomen soft and non tender Skin. No rashes Musculoskeletal: left AKA wound clean with no erythema or drainage. .      Data Reviewed: I have personally reviewed following labs and imaging studies  CBC: Recent Labs  Lab 10/06/20 0041  WBC 10.4  HGB 9.9*  HCT 31.9*  MCV 93.8  PLT 304   Basic Metabolic Panel: Recent Labs  Lab 10/06/20 0041  NA 136  K 2.9*  CL 102  CO2 24  GLUCOSE 124*  BUN 16  CREATININE 1.29*  CALCIUM 8.9  MG 1.7  PHOS 4.4   GFR: Estimated Creatinine Clearance: 73.5 mL/min (A) (by C-G formula based on SCr of 1.29 mg/dL (H)). Liver Function Tests: Recent Labs  Lab 10/06/20 0041  AST 19  ALT 17  ALKPHOS 98  BILITOT 0.8  PROT 8.4*  ALBUMIN 3.3*   Recent Labs  Lab 10/06/20 0041  LIPASE 22   No results for input(s): AMMONIA in the last 168 hours. Coagulation Profile: No results for input(s): INR, PROTIME in the last 168 hours. Cardiac Enzymes: No results for input(s): CKTOTAL, CKMB, CKMBINDEX, TROPONINI in the last 168 hours. BNP (last 3 results) No results for input(s): PROBNP in the last 8760 hours. HbA1C: No results for input(s): HGBA1C in the last 72 hours. CBG: No results for input(s): GLUCAP in the last 168 hours. Lipid Profile: No results for input(s): CHOL, HDL, LDLCALC, TRIG, CHOLHDL, LDLDIRECT in the last 72 hours. Thyroid Function Tests: No results for input(s): TSH, T4TOTAL, FREET4, T3FREE, THYROIDAB in the last 72 hours. Anemia Panel: No results  for input(s): VITAMINB12, FOLATE, FERRITIN, TIBC, IRON, RETICCTPCT in the last 72 hours.    Radiology Studies: I have reviewed all of the imaging during this hospital visit personally     Scheduled Meds: . enoxaparin (LOVENOX) injection  40 mg Subcutaneous Q24H  . feeding supplement  237 mL Oral BID BM  . oxyCODONE  20 mg Oral Q12H  . potassium chloride  40 mEq Oral Once   Continuous Infusions:   LOS: 0 days        Marguetta Windish 12/04/20, MD

## 2020-10-06 NOTE — Progress Notes (Signed)
PT Cancellation Note  Patient Details Name: Connor [name] MRN: [id number] DOB: May 26, 1956   Cancelled Treatment:    Reason Eval/Treat Not Completed: Patient declined, no reason specified.  Patient declined 2 attempts due to want to rest in AM and feeling to cold in PM - RN aware.  Will check back tomorrow.   3:34 PM, 10/06/20 Ocie Bob, MPT Physical Therapist with [facility] [phone] office [phone] mobile phone

## 2020-10-06 NOTE — ED Notes (Signed)
Refused breakfast tray - reports he does not feel like eating

## 2020-10-06 NOTE — H&P (Signed)
History and Physical  Connor [name] MWN:[id number] DOB: [birth year] DOA: 10/05/2020  Referring physician: Marily Memos, MD PCP: Hoy Register, MD  Patient coming from: Home  Chief Complaint: Chest pain  HPI: Connor [name] is a 65 y.o. male with medical history significant for  aortic dissection with several complicated surgeries including redo thoracic aortic repair as well as sternal infection, atrial fibrillation on Eliquis, CAD with CABG, CKD, hypertension and obesity who presents to the emergency department due to chest pain which started at home yesterday.  Pain was in the left sternal area and was described as dull with pain that gradually started as 2/10 and progressively increased to 5/10 on pain scale with radiation to both shoulders.  Pain was reproducible and there was no known aggravating/alleviating factor.  He denies shortness of breath, fever, chills, diaphoresis. 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  ED Course:  In the emergency department, he was bradycardic and intermittently tachypneic.  Work-up in the ED showed normocytic anemia, hypokalemia.  BUN/creatinine 16/1.29.  Albumin 3.3.  Troponin x2 was flat at 12 Chest x-ray showed status post aortic stent with interval slight enlargement of the intrathoracic descending aorta CT angiography of chest, abdomen and pelvis showed Complex thoracoabdominal aneurysm and dissection, status post repair of the ascending thoracic aorta and stent graft in the aortic arch. Overall size and configuration is essentially unchanged since prior studies. Patient was treated with aspirin, IV Dilaudid, IV Zofran and hydralazine were given.  IV hydration was provided.  IV nitroglycerin was started.  Hospitalist was  asked to admit patient for further evaluation and management.   Review of Systems: Constitutional: Negative for chills and fever.  HENT: Negative for ear pain and sore throat.   Eyes: Negative for pain and visual disturbance.  Respiratory: Negative for cough, chest tightness and shortness of breath.   Cardiovascular: Positive for chest pain.  Negative for palpitations.  Gastrointestinal: Negative for abdominal pain and vomiting.  Endocrine: Negative for polyphagia and polyuria.  Genitourinary: Negative for decreased urine volume, dysuria, enuresis Musculoskeletal: Negative for arthralgias and back pain.  Skin: Negative for color change and rash.  Allergic/Immunologic: Negative for immunocompromised state.  Neurological: Negative for tremors, syncope, speech difficulty, weakness, light-headedness and headaches.  Hematological: Does not bruise/bleed easily.  All other systems reviewed and are negative   Past Medical History:  Diagnosis Date  . AAA (abdominal aortic aneurysm) (HCC)   . History of open heart surgery   . Hypertension   . Seroma due to trauma [facility])    Past Surgical History:  Procedure Laterality Date  . ABDOMINAL AORTIC ANEURYSM REPAIR    . AMPUTATION Left 09/07/2020   Procedure: ATTEMPTED LEFT LEG DEBRIDEMENT FASCIOTOMIES, APPLY INSTILLATION WOUND VAC, ABOVE KNEE AMPUTATION;  Surgeon: Nadara Mustard, MD;  Location: [facility] OR;  Service: Orthopedics;  Laterality: Left;  . BUBBLE STUDY  09/05/2020   Procedure: BUBBLE STUDY;  Surgeon: Meriam Sprague, MD;  Location: [facility] ENDOSCOPY;  Service: Cardiovascular;;  . CARDIOVERSION N/A 02/11/2020   Procedure: CARDIOVERSION;  Surgeon: Little Ishikawa, MD;  Location: [facility] ENDOSCOPY;  Service: Cardiovascular;  Laterality: N/A;  . CORONARY ARTERY BYPASS GRAFT    . STUMP REVISION Left 09/09/2020   Procedure: REVISION LEFT ABOVE KNEE AMPUTATION;  Surgeon: Nadara Mustard, MD;  Location: [facility]  OR;  Service: Orthopedics;  Laterality:  Left;  . TEE WITHOUT CARDIOVERSION N/A 09/05/2020   Procedure: TRANSESOPHAGEAL ECHOCARDIOGRAM (TEE);  Surgeon: Meriam Sprague, MD;  Location: [facility] ENDOSCOPY;  Service: Cardiovascular;  Laterality: N/A;    Social History:  reports that he has been smoking. He has been smoking about 0.50 packs per day. He has never used smokeless tobacco. He reports that he does not drink alcohol and does not use drugs.   No Known Allergies  Family History  Problem Relation Age of Onset  . CAD Mother   . CVA Father      Prior to Admission medications   Medication Sig Start Date End Date Taking? Authorizing Provider  diclofenac Sodium (VOLTAREN) 1 % GEL Apply 2 g topically 4 (four) times daily. 09/29/20   Angiulli, Mcarthur Rossetti, PA-C  diltiazem (CARDIZEM CD) 240 MG 24 hr capsule Take 1 capsule (240 mg total) by mouth daily. 09/29/20   Angiulli, Mcarthur Rossetti, PA-C  ELIQUIS 5 MG TABS tablet Take 1 tablet (5 mg total) by mouth 2 (two) times daily. 09/29/20   Angiulli, Mcarthur Rossetti, PA-C  gabapentin (NEURONTIN) 400 MG capsule Take 1 capsule (400 mg total) by mouth 3 (three) times daily. 09/29/20   Angiulli, Mcarthur Rossetti, PA-C  hydrALAZINE (APRESOLINE) 25 MG tablet Take 1 tablet (25 mg total) by mouth every 8 (eight) hours. 09/29/20   Angiulli, Mcarthur Rossetti, PA-C  labetalol (NORMODYNE) 200 MG tablet Take 2 tablets (400 mg total) by mouth 2 (two) times daily. 09/29/20   Angiulli, Mcarthur Rossetti, PA-C  methocarbamol (ROBAXIN) 500 MG tablet Take 2 tablets (1,000 mg total) by mouth every 6 (six) hours as needed for muscle spasms. 09/29/20   Angiulli, Mcarthur Rossetti, PA-C  Multiple Vitamins-Minerals (MULTIVITAMIN WITH MINERALS) tablet Take 1 tablet by mouth daily. Men    [provider]  oxyCODONE (OXY IR/ROXICODONE) 5 MG immediate release tablet Take 1-2 tablets (5-10 mg total) by mouth 4 (four) times daily. 09/29/20   Angiulli, Mcarthur Rossetti, PA-C  oxyCODONE (OXYCONTIN) 20 mg 12 hr tablet Take 1 tablet (20 mg total) by mouth every 12 (twelve) hours. 09/29/20    Angiulli, Mcarthur Rossetti, PA-C  polyethylene glycol (MIRALAX / GLYCOLAX) 17 g packet Take 17 g by mouth daily. 09/20/20   Calvert Cantor, MD  spironolactone (ALDACTONE) 25 MG tablet Take 1 tablet (25 mg total) by mouth daily. 09/29/20   Charlton Amor, PA-C    Physical Exam: BP 106/67   Pulse (!) 50   Temp (!) 97.5 F (36.4 C) (Oral)   Resp (!) 21   Ht 5\' 11"  (1.803 m)   Wt 108.9 kg   SpO2 96%   BMI 33.47 kg/m   . General: 65 y.o. year-old male well developed well nourished in no acute distress.  Alert and oriented x3. 65 HEENT: NCAT, EOMI . Neck: Supple, trachea medial . Cardiovascular: Regular rate and rhythm with no rubs or gallops.  No thyromegaly or JVD noted.  No lower extremity edema. 2/4 pulses in all 4 extremities. Marland Kitchen Respiratory: Clear to auscultation with no wheezes or rales. Good inspiratory effort. . Abdomen: Soft nontender nondistended with normal bowel sounds x4 quadrants. . Muskuloskeletal: Left AKA. No cyanosis, clubbing or edema noted on RLE . Neuro: CN II-XII intact, strength, sensation, reflexes intact . Skin: No ulcerative lesions noted or rashes . Psychiatry: Mood is appropriate for condition and setting          Labs on Admission:  Basic Metabolic Panel: Recent  Labs  Lab 10/06/20 0041  NA 136  K 2.9*  CL 102  CO2 24  GLUCOSE 124*  BUN 16  CREATININE 1.29*  CALCIUM 8.9  MG 1.7  PHOS 4.4   Liver Function Tests: Recent Labs  Lab 10/06/20 0041  AST 19  ALT 17  ALKPHOS 98  BILITOT 0.8  PROT 8.4*  ALBUMIN 3.3*   Recent Labs  Lab 10/06/20 0041  LIPASE 22   No results for input(s): AMMONIA in the last 168 hours. CBC: Recent Labs  Lab 10/06/20 0041  WBC 10.4  HGB 9.9*  HCT 31.9*  MCV 93.8  PLT 304   Cardiac Enzymes: No results for input(s): CKTOTAL, CKMB, CKMBINDEX, TROPONINI in the last 168 hours.  BNP (last 3 results) No results for input(s): BNP in the last 8760 hours.  ProBNP (last 3 results) No results for input(s): PROBNP in  the last 8760 hours.  CBG: No results for input(s): GLUCAP in the last 168 hours.  Radiological Exams on Admission: DG Chest 1 View  Result Date: 10/06/2020 CLINICAL DATA:  Chest pain EXAM: CHEST  1 VIEW COMPARISON:  August 30, 2020 FINDINGS: Foot there is stable cardiomegaly. The patient is status post intrathoracic aortic stent placement. There appears to be slightly enlarged appearance of the aortic knob and descending intrathoracic aorta when in comparison to the prior exam. No large airspace consolidation or pleural effusion. Surgical clips seen within the right adnexa. IMPRESSION: Status post aortic stent with interval slight enlargement of the intrathoracic descending aorta. Would recommend CT for further evaluation. Electronically Signed   By: Jonna Clark M.D.   On: 10/06/2020 00:41   CT Angio Chest/Abd/Pel for Dissection W and/or Wo Contrast  Result Date: 10/06/2020 CLINICAL DATA:  Thoracic aortic disease.  Abdominal pain. EXAM: CT ANGIOGRAPHY CHEST, ABDOMEN AND PELVIS TECHNIQUE: Non-contrast CT of the chest was initially obtained. Multidetector CT imaging through the chest, abdomen and pelvis was performed using the standard protocol during bolus administration of intravenous contrast. Multiplanar reconstructed images and MIPs were obtained and reviewed to evaluate the vascular anatomy. CONTRAST:  OMNIPAQUE IOHEXOL 350 MG/ML SOLN COMPARISON:  Noncontrast CT chest, abdomen and pelvis 09/17/2020. CT a abdomen and pelvis 03/01/2020. FINDINGS: CTA CHEST FINDINGS Cardiovascular: Patient is status post tube graft repair of the ascending thoracic aorta hand stent graft placement in the aortic arch. Dissection is again noted involving the distal aortic arch and descending thoracic aorta, predominantly thrombosed from the proximal to mid thoracic aorta to the aorta hiatus. Maximum diameter approximately 5.6 cm, stable since most recent noncontrast chest CT. Cardiomegaly. Mediastinum/Nodes: No  mediastinal, hilar, or axillary adenopathy. Trachea and esophagus are unremarkable. Thyroid unremarkable. Lungs/Pleura: Vascular congestion. No confluent opacities or effusions. Musculoskeletal: Chest wall soft tissues are unremarkable. No acute bony abnormality. Review of the MIP images confirms the above findings. CTA ABDOMEN AND PELVIS FINDINGS VASCULAR Aorta: Aortic dissection continues into the abdominal aorta where the false lumen remains patent. Stent noted across the dissection flap at the level of the renal arteries to allow communication from true false lumen. Maximum diameter 4.5 cm at the level of the SMA. Celiac: Dissection flap extends into the celiac artery which is patent. SMA: Dissection flap extends into the SMA which is patent. Renals: Left renal artery arises from the false lumen with vascular stent in place. Vessel is patent. Right renal artery arises from the true lumen and is patent. IMA: Arises from the false lumen and is patent. Inflow: Stent extends from the  true lumen into the left common iliac artery which is ectatic measuring up 2 1.8 cm, stable. Right common iliac artery is patent, arising from the true lumen. The dissection extends into the left internal iliac artery. Veins: No obvious venous abnormality within the limitations of this arterial phase study. Review of the MIP images confirms the above findings. NON-VASCULAR Hepatobiliary: Layering gallstones within the gallbladder. No focal hepatic abnormality. Pancreas: No focal abnormality or ductal dilatation. Spleen: No focal abnormality.  Normal size. Adrenals/Urinary Tract: Symmetrically enhancing kidneys. No hydronephrosis. Adrenal glands and urinary bladder unremarkable. Stomach/Bowel: Normal appendix. Stomach, large and small bowel grossly unremarkable. Lymphatic: No adenopathy Reproductive: Mildly prominent prostate. Other: No free fluid or free air. Musculoskeletal: No acute bony abnormality. Review of the MIP images confirms  the above findings. IMPRESSION: Complex thoracoabdominal aneurysm and dissection, status post repair of the ascending thoracic aorta and stent graft in the aortic arch. Overall size and configuration is essentially unchanged since prior studies. Cardiomegaly, vascular congestion. Cholelithiasis. No acute findings. Electronically Signed   By: Charlett Nose M.D.   On: 10/06/2020 02:00    EKG: I independently viewed the EKG done and my findings are as followed: Sinus bradycardia at rate of 51 bpm with first-degree AV block  Assessment/Plan Present on Admission: . Hypoalbuminemia . Hypokalemia  Active Problems:   Hypoalbuminemia   Hypokalemia   History of left above knee amputation (HCC)   Atypical chest pain  Atypical Chest Pain  Chest pain was reproducible, IV nitroglycerin was started, but patient continues to have chest, this is possibly pleuritic in nature Continue telemetry Troponins x2 was flat at 12 EKG personally reviewed showed sinus bradycardia at a rate of 51 bpm with first-degree heart block  Cardiology will be consulted to help decide if Stress test is needed in am Versus other diagnostic modalities.   Continue oxycodone  IV nitroglycerin will be held at this time due to soft BP and due to no relief of chest pain   Chronic A. 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

## 2020-10-07 ENCOUNTER — Telehealth: Payer: Self-pay

## 2020-10-07 DIAGNOSIS — I480 Paroxysmal atrial fibrillation: Secondary | ICD-10-CM | POA: Diagnosis present

## 2020-10-07 DIAGNOSIS — I1310 Hypertensive heart and chronic kidney disease without heart failure, with stage 1 through stage 4 chronic kidney disease, or unspecified chronic kidney disease: Secondary | ICD-10-CM | POA: Diagnosis present

## 2020-10-07 DIAGNOSIS — Z8249 Family history of ischemic heart disease and other diseases of the circulatory system: Secondary | ICD-10-CM | POA: Diagnosis not present

## 2020-10-07 DIAGNOSIS — F17210 Nicotine dependence, cigarettes, uncomplicated: Secondary | ICD-10-CM | POA: Diagnosis present

## 2020-10-07 DIAGNOSIS — I10 Essential (primary) hypertension: Secondary | ICD-10-CM | POA: Diagnosis not present

## 2020-10-07 DIAGNOSIS — Z6833 Body mass index (BMI) 33.0-33.9, adult: Secondary | ICD-10-CM | POA: Diagnosis not present

## 2020-10-07 DIAGNOSIS — I440 Atrioventricular block, first degree: Secondary | ICD-10-CM | POA: Diagnosis present

## 2020-10-07 DIAGNOSIS — I4819 Other persistent atrial fibrillation: Secondary | ICD-10-CM

## 2020-10-07 DIAGNOSIS — N183 Chronic kidney disease, stage 3 unspecified: Secondary | ICD-10-CM | POA: Diagnosis not present

## 2020-10-07 DIAGNOSIS — K2900 Acute gastritis without bleeding: Secondary | ICD-10-CM | POA: Diagnosis present

## 2020-10-07 DIAGNOSIS — R1013 Epigastric pain: Secondary | ICD-10-CM | POA: Diagnosis not present

## 2020-10-07 DIAGNOSIS — Z7901 Long term (current) use of anticoagulants: Secondary | ICD-10-CM | POA: Diagnosis not present

## 2020-10-07 DIAGNOSIS — Z89612 Acquired absence of left leg above knee: Secondary | ICD-10-CM | POA: Diagnosis not present

## 2020-10-07 DIAGNOSIS — Z951 Presence of aortocoronary bypass graft: Secondary | ICD-10-CM | POA: Diagnosis not present

## 2020-10-07 DIAGNOSIS — I444 Left anterior fascicular block: Secondary | ICD-10-CM | POA: Diagnosis present

## 2020-10-07 DIAGNOSIS — N182 Chronic kidney disease, stage 2 (mild): Secondary | ICD-10-CM

## 2020-10-07 DIAGNOSIS — R079 Chest pain, unspecified: Secondary | ICD-10-CM | POA: Diagnosis not present

## 2020-10-07 DIAGNOSIS — Z20822 Contact with and (suspected) exposure to covid-19: Secondary | ICD-10-CM | POA: Diagnosis present

## 2020-10-07 DIAGNOSIS — E876 Hypokalemia: Secondary | ICD-10-CM | POA: Diagnosis present

## 2020-10-07 DIAGNOSIS — E669 Obesity, unspecified: Secondary | ICD-10-CM | POA: Diagnosis present

## 2020-10-07 DIAGNOSIS — R0789 Other chest pain: Secondary | ICD-10-CM | POA: Diagnosis not present

## 2020-10-07 DIAGNOSIS — E441 Mild protein-calorie malnutrition: Secondary | ICD-10-CM | POA: Diagnosis present

## 2020-10-07 DIAGNOSIS — E8770 Fluid overload, unspecified: Secondary | ICD-10-CM | POA: Diagnosis present

## 2020-10-07 DIAGNOSIS — N179 Acute kidney failure, unspecified: Secondary | ICD-10-CM | POA: Diagnosis present

## 2020-10-07 DIAGNOSIS — Z79899 Other long term (current) drug therapy: Secondary | ICD-10-CM | POA: Diagnosis not present

## 2020-10-07 LAB — COMPREHENSIVE METABOLIC PANEL
ALT: 15 U/L (ref 0–44)
AST: 18 U/L (ref 15–41)
Albumin: 3.1 g/dL — ABNORMAL LOW (ref 3.5–5.0)
Alkaline Phosphatase: 97 U/L (ref 38–126)
Anion gap: 7 (ref 5–15)
BUN: 15 mg/dL (ref 8–23)
CO2: 28 mmol/L (ref 22–32)
Calcium: 8.6 mg/dL — ABNORMAL LOW (ref 8.9–10.3)
Chloride: 101 mmol/L (ref 98–111)
Creatinine, Ser: 1.18 mg/dL (ref 0.61–1.24)
GFR, Estimated: >60 mL/min (ref >60)
Glucose, Bld: 95 mg/dL (ref 70–99)
Potassium: 3.4 mmol/L — ABNORMAL LOW (ref 3.5–5.1)
Sodium: 136 mmol/L (ref 135–145)
Total Bilirubin: 1 mg/dL (ref 0.3–1.2)
Total Protein: 7.9 g/dL (ref 6.5–8.1)

## 2020-10-07 LAB — PROTIME-INR
INR: 1.6 — ABNORMAL HIGH (ref 0.8–1.2)
Prothrombin Time: 18.1 seconds — ABNORMAL HIGH (ref 11.4–15.2)

## 2020-10-07 LAB — CBC
HCT: 33.6 % — ABNORMAL LOW (ref 39.0–52.0)
Hemoglobin: 10.3 g/dL — ABNORMAL LOW (ref 13.0–17.0)
MCH: 28.9 pg (ref 26.0–34.0)
MCHC: 30.7 g/dL (ref 30.0–36.0)
MCV: 94.1 fL (ref 80.0–100.0)
Platelets: 316 10*3/uL (ref 150–400)
RBC: 3.57 MIL/uL — ABNORMAL LOW (ref 4.22–5.81)
RDW: 15.9 % — ABNORMAL HIGH (ref 11.5–15.5)
WBC: 11.4 10*3/uL — ABNORMAL HIGH (ref 4.0–10.5)
nRBC: 0 % (ref 0.0–0.2)

## 2020-10-07 LAB — APTT: aPTT: 41 seconds — ABNORMAL HIGH (ref 24–36)

## 2020-10-07 MED ORDER — OXYCODONE HCL 5 MG PO TABS
5.0000 mg | ORAL_TABLET | Freq: Four times a day (QID) | ORAL | Status: DC | PRN
  Administered 2020-10-08 (×2): 5 mg via ORAL
  Filled 2020-10-07 (×3): qty 1

## 2020-10-07 MED ORDER — ONDANSETRON HCL 4 MG/2ML IJ SOLN: 4.0000 mg | Freq: Four times a day (QID) | INTRAMUSCULAR | Status: DC | PRN

## 2020-10-07 MED ORDER — MORPHINE SULFATE (PF) 2 MG/ML IV SOLN
1.0000 mg | INTRAVENOUS | Status: DC | PRN
  Administered 2020-10-07 – 2020-10-09 (×3): 1 mg via INTRAVENOUS
  Filled 2020-10-07 (×3): qty 1

## 2020-10-07 MED ORDER — PANTOPRAZOLE SODIUM 40 MG PO TBEC: 40.0000 mg | DELAYED_RELEASE_TABLET | Freq: Every day | ORAL | Status: DC

## 2020-10-07 MED ORDER — SUCRALFATE 1 GM/10ML PO SUSP
1.0000 g | Freq: Three times a day (TID) | ORAL | Status: DC
  Administered 2020-10-07 – 2020-10-10 (×11): 1 g via ORAL
  Filled 2020-10-07 (×12): qty 10

## 2020-10-07 MED ORDER — PANTOPRAZOLE SODIUM 40 MG IV SOLR
40.0000 mg | Freq: Two times a day (BID) | INTRAVENOUS | Status: DC
  Administered 2020-10-07 – 2020-10-09 (×6): 40 mg via INTRAVENOUS
  Filled 2020-10-07 (×7): qty 40

## 2020-10-07 NOTE — Telephone Encounter (Signed)
I'm so sorry to hear that- I feel bad for him- thank you

## 2020-10-07 NOTE — Progress Notes (Signed)
Prn pain medication and scheduled Hydralazine 25 mg given.

## 2020-10-07 NOTE — Evaluation (Signed)
Occupational Therapy Evaluation Patient Details Name: Connor [name] MRN: [id number] DOB: 1956-02-12 Today's Date: 10/07/2020    History of Present Illness Connor [name] is a 65 y.o. male with medical history significant for  aortic dissection with several complicated surgeries including redo thoracic aortic repair as well as sternal infection, atrial fibrillation on Eliquis, CAD with CABG, CKD, hypertension and obesity who presents to the emergency department due to chest pain which started at home yesterday.  Pain was in the left sternal area and was described as dull with pain that gradually started as 2/10 and progressively increased to 5/10 on pain scale with radiation to both shoulders.  Pain was reproducible and there was no known aggravating/alleviating factor.  He denies shortness of breath, fever, chills, diaphoresis.   Clinical Impression   Pt agreeable to OT evaluation. Pt performing ADLs while standing at sink, supervision level. Pt using RW for functional mobility in room, no difficulty. Recommend resuming HH OT services on discharge. No further acute OT services required at this time.     Follow Up Recommendations  Home health OT    Equipment Recommendations  Other (comment) (reacher)       Precautions / Restrictions Precautions Precautions: Fall Precaution Comments: L AKA Restrictions Weight Bearing Restrictions: Yes LLE Weight Bearing: Non weight bearing      Mobility Bed Mobility Overal bed mobility: Modified Independent Bed Mobility: Supine to Sit     Supine to sit: Modified independent (Device/Increase time)          Transfers Overall transfer level: Needs assistance Equipment used: Rolling walker (2 wheeled) Transfers: Sit to/from [employer] Sit to Stand: Supervision Stand pivot transfers: Supervision                ADL either performed or assessed with clinical judgement   ADL Overall ADL's : Needs  assistance/impaired Eating/Feeding: Modified independent;Sitting   Grooming: Wash/dry hands;Wash/dry face;Oral care;Brushing hair;Supervision/safety;Standing Grooming Details (indicate cue type and reason): pt standing at sink for 10+ minutes completing ADLs, no LOB Upper Body Bathing: Modified independent;Sitting               Toilet Transfer: Supervision/safety;Ambulation;RW   Toileting- Clothing Manipulation and Hygiene: Supervision/safety;Sitting/lateral lean;Sit to/from stand       Functional mobility during ADLs: Supervision/safety;Rolling walker       Vision Baseline Vision/History: Wears glasses Wears Glasses: Reading only Patient Visual Report: No change from baseline Vision Assessment?: No apparent visual deficits            Pertinent Vitals/Pain Pain Assessment: 0-10 Pain Score: 5  Pain Location: upper abdomen Pain Descriptors / Indicators: Aching Pain Intervention(s): Limited activity within patient's tolerance;Monitored during session;Repositioned;Premedicated before session     Hand Dominance Right   Extremity/Trunk Assessment Upper Extremity Assessment Upper Extremity Assessment: Overall WFL for tasks assessed   Lower Extremity Assessment Lower Extremity Assessment: Defer to PT evaluation   Cervical / Trunk Assessment Cervical / Trunk Assessment: Normal   Communication Communication Communication: No difficulties   Cognition Arousal/Alertness: Awake/alert Behavior During Therapy: WFL for tasks assessed/performed Overall Cognitive Status: Within Functional Limits for tasks assessed                                                Home Living Family/patient expects to be discharged to:: Private residence Living Arrangements: Other relatives (cousin) Available Help at Discharge:  Family;Available PRN/intermittently Type of Home: House Home Access: Stairs to enter [employer] of Steps: 2 steps in back and 5 steps  in front. reports ramp is in progress Entrance Stairs-Rails: Left (in the front) Home Layout: One level     Bathroom Shower/Tub: Chief Strategy Officer: Standard     Home Equipment: Environmental consultant - 2 wheels;Bedside commode;Shower seat;Wheelchair - manual   Additional Comments: Has been receiving HH RN/PT/OT since discharge from [facility]  Lives With: Other (Comment) (cousin)    Prior Functioning/Environment Level of Independence: Independent with assistive device(s);Needs assistance  Gait / Transfers Assistance Needed: Using RW and WC for mobility at this time. ADL's / Homemaking Assistance Needed: Pt reports independence with basic ADLs, cousin assists with meal prep and housework            OT Problem List: Decreased strength;Decreased activity tolerance;Impaired balance (sitting and/or standing);Decreased knowledge of use of DME or AE                       Co-evaluation PT/OT/SLP Co-Evaluation/Treatment: Yes Reason for Co-Treatment: Complexity of the patient's impairments (multi-system involvement)   OT goals addressed during session: ADL's and self-care      AM-PAC OT "6 Clicks" Daily Activity     Outcome Measure Help from another person eating meals?: None Help from another person taking care of personal grooming?: None Help from another person toileting, which includes using toliet, bedpan, or urinal?: None Help from another person bathing (including washing, rinsing, drying)?: None Help from another person to put on and taking off regular upper body clothing?: None Help from another person to put on and taking off regular lower body clothing?: None 6 Click Score: 24   End of Session Equipment Utilized During Treatment: Rolling walker  Activity Tolerance: Patient tolerated treatment well Patient left: in chair;with call bell/phone within reach  OT Visit Diagnosis: Muscle weakness (generalized) (M62.81);Pain                Time: 5456-2563 OT Time  Calculation (min): 22 min Charges:  OT General Charges $OT Visit: 1 Visit OT Evaluation $OT Eval Low Complexity: 1 Low   Connor [name], OTR/L  [phone] 10/07/2020, 8:33 AM

## 2020-10-07 NOTE — TOC Initial Note (Signed)
Transition of Care [facility]) - Initial/Assessment Note    Patient Details  Name: Naseer Hearn MRN: [id number] Date of Birth: 12-06-1955  Transition of Care [facility]) CM/SW Contact:    Villa Herb, LCSWA Phone Number: 10/07/2020, 7:06 PM  Clinical Narrative:                 Pt admitted due to atypical chest pain. Pt is high risk for readmission. Pt lives with his cousin. Pt is able to complete ADLs independently and states his cousin stays close by to help if needed. Pt is able to drive. Pt is active with [facility] for [facility] PT/RN, resumption orders will be needed at d/c. Pt states he has a walker and wheelchair. TOC to follow for d/c needs.   Expected Discharge Plan: Home w Home Health Services Barriers to Discharge: Continued Medical Work up   Patient Goals and CMS Choice Patient states their goals for this hospitalization and ongoing recovery are:: Home with [facility]   Choice offered to / list presented to : NA  Expected Discharge Plan and Services Expected Discharge Plan: Home w Home Health Services In-house Referral: NA Discharge Planning Services: NA Post Acute Care Choice: Home Health Living arrangements for the past 2 months: Single Family Home                 DME Arranged: N/A DME Agency: NA       HH Arranged: NA HH Agency: NA        Prior Living Arrangements/Services Living arrangements for the past 2 months: Single Family Home Lives with:: Self,Relatives Patient language and need for interpreter reviewed:: Yes Do you feel safe going back to the place where you live?: Yes      Need for Family Participation in Patient Care: No (Comment) Care giver support system in place?: Yes (comment) Current home services: DME,Home PT,Home RN Criminal Activity/Legal Involvement Pertinent to Current Situation/Hospitalization: No - Comment as needed  Activities of Daily Living Home Assistive Devices/Equipment: Bedside commode/3-in-1,Eyeglasses,Wheelchair,Wound Vac,Cane (specify quad or  straight) ADL Screening (condition at time of admission) Patient's cognitive ability adequate to safely complete daily activities?: Yes Is the patient deaf or have difficulty hearing?: No Does the patient have difficulty seeing, even when wearing glasses/contacts?: No Does the patient have difficulty concentrating, remembering, or making decisions?: No Patient able to express need for assistance with ADLs?: Yes Does the patient have difficulty dressing or bathing?: No Independently performs ADLs?: Yes (appropriate for developmental age) Does the patient have difficulty walking or climbing stairs?: Yes Weakness of Legs: Both Weakness of Arms/Hands: None  Permission Sought/Granted                  Emotional Assessment Appearance:: Appears stated age Attitude/Demeanor/Rapport: Engaged Affect (typically observed): Accepting Orientation: : Oriented to Self,Oriented to Place,Oriented to  Time,Oriented to Situation Alcohol / Substance Use: Not Applicable Psych Involvement: No (comment)  Admission diagnosis:  Chest pain [R07.9] Nonspecific chest pain [R07.9] Dyspepsia [R10.13] Patient Active Problem List   Diagnosis Date Noted  . Dyspepsia 10/07/2020  . CKD stage G2/A2, GFR 60-89 and albumin creatinine ratio 30-299 mg/g 10/07/2020  . Atypical chest pain 10/06/2020  . Acute blood loss anemia   . Essential hypertension   . Postoperative pain   . Severe anemia 09/19/2020  . History of left above knee amputation (HCC) 09/19/2020  . Left above-knee amputee (HCC) 09/19/2020  . 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 CKD (chronic kidney disease), stage III (  HCC) 09/01/2020  . Morbid obesity (HCC) 09/01/2020  . Severe sepsis with acute organ dysfunction due to Streptococcus species (HCC) 08/30/2020  . Cellulitis of left leg 08/30/2020  . Lactic acidosis 08/30/2020  . Elevated troponin 08/30/2020  . Hypoalbuminemia 08/30/2020  . Leukocytosis 08/30/2020  .  Thrombocytopenia (HCC) 08/30/2020  . Hypokalemia 08/30/2020  . Hyperglycemia 08/30/2020  . Transaminitis 08/30/2020  . Total bilirubin, elevated 08/30/2020  . AKI (acute kidney injury) (HCC) 08/30/2020  . Nausea vomiting and diarrhea 08/30/2020  . Hypertension 05/20/2020  . Persistent atrial fibrillation (HCC)    PCP:  Hoy Register, MD Pharmacy:   [facility] [phone] - [place], [place] - [address] AT [address] [place] [address] Phone: [phone] Fax: [phone]  [facility] [place], [place] - [address] [place] [address] Phone: [phone] Fax: [phone]     Social Determinants of Health (SDOH) Interventions    Readmission Risk Interventions Readmission Risk Prevention Plan 10/07/2020  Transportation Screening Complete  HRI or Home Care Consult Complete  Social Work Consult for Recovery Care Planning/Counseling Complete  Palliative Care Screening Not Applicable  Medication Review Oceanographer) Complete

## 2020-10-07 NOTE — Evaluation (Signed)
Physical Therapy Evaluation Patient Details Name: Connor [name] MRN: [id number] DOB: 1955-11-30 Today's Date: 10/07/2020   History of Present Illness  Connor [name] is a 65 y.o. male with medical history significant for  aortic dissection with several complicated surgeries including redo thoracic aortic repair as well as sternal infection, atrial fibrillation on Eliquis, CAD with CABG, CKD, hypertension and obesity who presents to the emergency department due to chest pain which started at home yesterday.  Pain was in the left sternal area and was described as dull with pain that gradually started as 2/10 and progressively increased to 5/10 on pain scale with radiation to both shoulders.  Pain was reproducible and there was no known aggravating/alleviating factor.  He denies shortness of breath, fever, chills, diaphoresis.     Clinical Impression  Patient able to complete bed mobility and transfer without physical assist. Patient minimally unsteady upon standing initially with RW but does not lose balance. Patient able to ambulate in room with RW with hopping pattern on RLE. Patient ambulates to sink and back to bed and returns to sink where he shows good standing balance while completing daily hygenic tasks. He demonstrates good standing tolerance on RLE without assist. Patient ends session seated in chair. Patient will benefit from continued physical therapy in hospital and recommended venue below to increase strength, balance, endurance for safe ADLs and gait.     Follow Up Recommendations Home health PT    Equipment Recommendations  None recommended by PT    Recommendations for Other Services       Precautions / Restrictions Precautions Precautions: Fall Precaution Comments: L AKA Restrictions Weight Bearing Restrictions: Yes LLE Weight Bearing: Non weight bearing      Mobility  Bed Mobility Overal bed mobility: Modified Independent Bed Mobility: Supine to Sit     Supine to  sit: Modified independent (Device/Increase time)          Transfers Overall transfer level: Needs assistance Equipment used: Rolling walker (2 wheeled) Transfers: Sit to/from [employer] Sit to Stand: Supervision Stand pivot transfers: Supervision          Ambulation/Gait Ambulation/Gait assistance: Supervision Gait Distance (Feet): 25 Feet Assistive device: Rolling walker (2 wheeled) Gait Pattern/deviations: Decreased stride length Gait velocity: decreased   General Gait Details: hopping on RLE without loss of balance with UE support on RW  Stairs            Wheelchair Mobility    Modified Rankin (Stroke Patients Only)       Balance Overall balance assessment: Needs assistance Sitting-balance support: No upper extremity supported Sitting balance-Leahy Scale: Normal Sitting balance - Comments: seated EOB   Standing balance support: Bilateral upper extremity supported;During functional activity Standing balance-Leahy Scale: Good Standing balance comment: with RW                             Pertinent Vitals/Pain Pain Assessment: 0-10 Pain Score: 5  Pain Location: upper abdomen Pain Descriptors / Indicators: Aching Pain Intervention(s): Limited activity within patient's tolerance;Monitored during session;Premedicated before session;Repositioned    Home Living Family/patient expects to be discharged to:: Private residence Living Arrangements: Other relatives (cousin) Available Help at Discharge: Family;Available PRN/intermittently Type of Home: House Home Access: Stairs to enter Entrance Stairs-Rails: Left (in the front) Entrance Stairs-Number of Steps: 2 steps in back and 5 steps in front. reports ramp is in progress Home Layout: One level Home Equipment: Walker - 2 wheels;Bedside commode;Shower  seat;Wheelchair - manual Additional Comments: Has been receiving HH RN/PT/OT since discharge from CIR    Prior Function Level of  Independence: Independent with assistive device(s);Needs assistance   Gait / Transfers Assistance Needed: Using RW and WC for mobility at this time for household ambulation  ADL's / Homemaking Assistance Needed: Pt reports independence with basic ADLs, cousin assists with meal prep and housework        Hand Dominance   Dominant Hand: Right    Extremity/Trunk Assessment   Upper Extremity Assessment Upper Extremity Assessment: Defer to OT evaluation    Lower Extremity Assessment Lower Extremity Assessment: Overall WFL for tasks assessed    Cervical / Trunk Assessment Cervical / Trunk Assessment: Normal  Communication   Communication: No difficulties  Cognition Arousal/Alertness: Awake/alert Behavior During Therapy: WFL for tasks assessed/performed Overall Cognitive Status: Within Functional Limits for tasks assessed                                        General Comments      Exercises     Assessment/Plan    PT Assessment Patient needs continued PT services  PT Problem List Decreased strength;Decreased activity tolerance;Decreased balance;Decreased mobility;Decreased safety awareness;Pain       PT Treatment Interventions Balance training;DME instruction;Gait training;Stair training;Functional mobility training;Therapeutic activities;Therapeutic exercise;Patient/family education;Neuromuscular re-education    PT Goals (Current goals can be found in the Care Plan section)  Acute Rehab PT Goals Patient Stated Goal: Return home PT Goal Formulation: With patient Time For Goal Achievement: 10/14/20 Potential to Achieve Goals: Good    Frequency Min 3X/week   Barriers to discharge        Co-evaluation PT/OT/SLP Co-Evaluation/Treatment: Yes Reason for Co-Treatment: Complexity of the patient's impairments (multi-system involvement) PT goals addressed during session: Mobility/safety with mobility;Balance;Proper use of DME;Strengthening/ROM OT goals  addressed during session: ADL's and self-care       AM-PAC PT "6 Clicks" Mobility  Outcome Measure Help needed turning from your back to your side while in a flat bed without using bedrails?: None Help needed moving from lying on your back to sitting on the side of a flat bed without using bedrails?: None Help needed moving to and from a bed to a chair (including a wheelchair)?: A Little Help needed standing up from a chair using your arms (e.g., wheelchair or bedside chair)?: A Little Help needed to walk in hospital room?: A Little Help needed climbing 3-5 steps with a railing? : A Little 6 Click Score: 20    End of Session   Activity Tolerance: Patient tolerated treatment well Patient left: in chair;with call bell/phone within reach Nurse Communication: Mobility status PT Visit Diagnosis: Unsteadiness on feet (R26.81);Other abnormalities of gait and mobility (R26.89);Muscle weakness (generalized) (M62.81);Difficulty in walking, not elsewhere classified (R26.2);Pain    Time: 0800-0822 PT Time Calculation (min) (ACUTE ONLY): 22 min   Charges:   PT Evaluation $PT Eval Low Complexity: 1 Low PT Treatments $Therapeutic Activity: 8-22 mins        9:22 AM, 10/07/20 Wyman Songster PT, DPT Physical Therapist at [facility]

## 2020-10-07 NOTE — Progress Notes (Addendum)
PROGRESS NOTE    Connor [name]  KGM:[id number] DOB: Oct 01, 1955 DOA: 10/05/2020 PCP: Hoy Register, MD    Brief Narrative:  Mr. Mcburney was admitted to the hospital with the working diagnosis of atypical chest pain, complicated with dyspepsia and poor oral intake.   65 year old male past medical history for aortic dissection, status post repair, atrial fibrillation, coronary artery disease status post bypass grafting, chronic kidney disease, hypertension and obesity.  Reported left-sided sternal chest pain, dull in nature, 2-5/10 in intensity, with radiation to both shoulders.  No associated symptoms. Recent hospitalization 01/4 to 01/24 for severe sepsis due to necrotizing fasciitis, Streptococcus pyogenes, status post left AKA.  On his initial physical examination blood pressure 106/67, heart rate 50, temperature 97.5, respiratory rate 21, oxygen saturation 96% Lungs clear to auscultation bilaterally, heart S1-S2, present rhythmic, soft abdomen, left AKA.   Sodium 136, potassium 2.9, chloride 102, bicarb 24, glucose 124, BUN 16, creatinine 1.29, magnesium 1.7, troponin I 12-12, White count 10.4, hemoglobin 9.9, hematocrit 31.9, platelets 304. SARS COVID-19 pending. Chest x-ray with hilar vascular congestion. CT chest/abdomen and pelvis with bilateral groundglass opacities, predominantly right lower lobe. No dissection.   EKG 51 bpm, left axis deviation, left anterior fascicular block, first-degree AV block, sinus rhythm, no ST segment or T wave changes.\  Chest film with vascular congestion, CT with bilateral ground glass opacities suggestive of cardiogenic pulmonary edema.  Added furosemide 40 mg Iv x1 to target negative fluid balanced, follow strict in and out.  Patient reports now abdominal pain, that is worse with meals, positive nausea and very poor oral intake.    Assessment & Plan:   Principal Problem:   Atypical chest pain Active Problems:   Persistent atrial  fibrillation (HCC)   Hypertension   Hypoalbuminemia   Hypokalemia   History of left above knee amputation (HCC)   Dyspepsia   CKD stage G2/A2, GFR 60-89 and albumin creatinine ratio 30-299 mg/g    1. Atypical chest pain. Echocardiogram from 08/2020 with preserved LV systolic function 60 to 65% with elevated pulmonary pressure 34 mmHg.  Positive bubble study, intrapulmonary shunting. Bilateral atrial enlargement.   Patient has ruled out for acs.  2. New abdominal pain. His chest pain has migrated to the epigastrium, is sharp in nature, up to 10/10 in intensity, worse with meals and associated with nausea and poor oral intake.   Will add IV pantoprazole bid and sucralfate, change diet to full liquids and continue close monitoring po intake. As needed antiemetics and analgesics.   2. Paroxysmal atrial fibrillation. Continue rate control with diltiazem and anticoagulation with apixaban.    3. HTN. Continue blood pressure control with hydralazine, labetalol and spironolactone,   4. Obesity class 1. Calculated BMI is 33,4. High risk for inpatient complications.   5. Mild protein calorie malnutrition. Follow dietary  recommendations.   6. 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  Old records personally reviewed noted cr up to 7,17 on 08/2020.   Improved volume after one dose of furosemide, renal function today with serum cr at 1,18 with K at 3,4 and serum bicarbonate at 28. Hold on further diuresis, close follow up on renal function. Avoid hypotension and nephrotoxic medications.   7. Recent left AKA. Continue pain control with as needed oxycodone, will hold on long acting opiate for now.   Patient continue to be at high risk for worsening dyspepsia.   Status is: Observation  The patient will require care spanning > 2 midnights and should be  moved to inpatient because: Hemodynamically unstable and IV treatments appropriate due to intensity of illness or inability to  take PO  Dispo: The patient is from: Home              Anticipated d/c is to: Home              Anticipated d/c date is: 2 days              Patient currently is not medically stable to d/c.   Difficult to place patient No   DVT prophylaxis: Heparin   Code Status:   full  Family Communication:  No family at the bedside     Subjective: Patient with abdominal pain, associated with nausea and poor oral intake, is worse with meals. Sharp in nature up to 10/10 in intensity.   Objective: Vitals:   10/06/20 1759 10/06/20 2028 10/07/20 0523 10/07/20 0554  BP: (!) 178/88 (!) 156/90 (!) 139/105 132/88  Pulse: 76 81 (!) 133 84  Resp: 18 18 16 18   Temp: 98.8 F (37.1 C) 98.4 F (36.9 C) 99.6 F (37.6 C)   TempSrc: Oral Oral Oral   SpO2: 97% 98% 94% 95%  Weight:      Height:        Intake/Output Summary (Last 24 hours) at 10/07/2020 1134 Last data filed at 10/07/2020 0700 Gross per 24 hour  Intake 267.77 ml  Output 425 ml  Net -157.23 ml   Filed Weights   10/05/20 2322  Weight: 108.9 kg    Examination:   General: deconditioned  Neurology: Awake and alert, non focal  E ENT: no pallor, no icterus, oral mucosa moist Cardiovascular: No JVD. S1-S2 present, rhythmic, no gallops, rubs, or murmurs. No lower extremity edema. Pulmonary: vesicular breath sounds bilaterally, adequate air movement, no wheezing, rhonchi or rales. Gastrointestinal. Abdomen tender to palpation at the epigastrium, no robound or guarding Skin. No rashes Musculoskeletal: left AKA     Data Reviewed: I have personally reviewed following labs and imaging studies  CBC: Recent Labs  Lab 10/06/20 0041 10/07/20 0543  WBC 10.4 11.4*  HGB 9.9* 10.3*  HCT 31.9* 33.6*  MCV 93.8 94.1  PLT 304 316   Basic Metabolic Panel: Recent Labs  Lab 10/06/20 0041 10/07/20 0543  NA 136 136  K 2.9* 3.4*  CL 102 101  CO2 24 28  GLUCOSE 124* 95  BUN 16 15  CREATININE 1.29* 1.18  CALCIUM 8.9 8.6*  MG 1.7  --    PHOS 4.4  --    GFR: Estimated Creatinine Clearance: 80.4 mL/min (by C-G formula based on SCr of 1.18 mg/dL). Liver Function Tests: Recent Labs  Lab 10/06/20 0041 10/07/20 0543  AST 19 18  ALT 17 15  ALKPHOS 98 97  BILITOT 0.8 1.0  PROT 8.4* 7.9  ALBUMIN 3.3* 3.1*   Recent Labs  Lab 10/06/20 0041  LIPASE 22   No results for input(s): AMMONIA in the last 168 hours. Coagulation Profile: Recent Labs  Lab 10/07/20 0543  INR 1.6*   Cardiac Enzymes: No results for input(s): CKTOTAL, CKMB, CKMBINDEX, TROPONINI in the last 168 hours. BNP (last 3 results) No results for input(s): PROBNP in the last 8760 hours. HbA1C: No results for input(s): HGBA1C in the last 72 hours. CBG: No results for input(s): GLUCAP in the last 168 hours. Lipid Profile: No results for input(s): CHOL, HDL, LDLCALC, TRIG, CHOLHDL, LDLDIRECT in the last 72 hours. Thyroid Function Tests: No results for input(s): TSH,  T4TOTAL, FREET4, T3FREE, THYROIDAB in the last 72 hours. Anemia Panel: No results for input(s): VITAMINB12, FOLATE, FERRITIN, TIBC, IRON, RETICCTPCT in the last 72 hours.    Radiology Studies: I have reviewed all of the imaging during this hospital visit personally     Scheduled Meds: . apixaban  5 mg Oral BID  . diltiazem  240 mg Oral Daily  . feeding supplement  237 mL Oral BID BM  . gabapentin  400 mg Oral TID  . hydrALAZINE  25 mg Oral Q8H  . labetalol  400 mg Oral BID  . multivitamin with minerals  1 tablet Oral Daily  . pantoprazole  40 mg Oral Daily  . polyethylene glycol  17 g Oral Daily  . potassium chloride  20 mEq Oral Once  . potassium chloride  40 mEq Oral Once  . spironolactone  25 mg Oral Daily  . sucralfate  1 g Oral TID WC & HS   Continuous Infusions:   LOS: 0 days        Dalia Jollie Annett Gula, MD

## 2020-10-07 NOTE — Telephone Encounter (Signed)
I called [name] to share possible medical transportation offered by [facility] / [employer]. Patient can call [phone] for transportation for more information.   Per Patient he is back in the hospital for a wound infection.  Hospital follow up on 10/10/2020 has been cancelled. He will call back to reschedule his follow up here at [facility].

## 2020-10-07 NOTE — Plan of Care (Signed)
  Problem: Acute Rehab PT Goals(only PT should resolve) Goal: Pt Will Transfer Bed To Chair/Chair To Bed Outcome: Progressing Flowsheets (Taken 10/07/2020 0922) Pt will Transfer Bed to Chair/Chair to Bed: with modified independence Goal: Pt Will Ambulate Outcome: Progressing Flowsheets (Taken 10/07/2020 0922) Pt will Ambulate:  100 feet  with modified independence  with rolling walker Goal: Pt/caregiver will Perform Home Exercise Program Outcome: Progressing Flowsheets (Taken 10/07/2020 0922) Pt/caregiver will Perform Home Exercise Program:  For increased strengthening  For improved balance  Independently   9:23 AM, 10/07/20 Wyman Songster PT, DPT Physical Therapist at [facility]

## 2020-10-08 DIAGNOSIS — I159 Secondary hypertension, unspecified: Secondary | ICD-10-CM

## 2020-10-08 DIAGNOSIS — N182 Chronic kidney disease, stage 2 (mild): Secondary | ICD-10-CM

## 2020-10-08 LAB — BASIC METABOLIC PANEL
Anion gap: 8 (ref 5–15)
BUN: 20 mg/dL (ref 8–23)
CO2: 25 mmol/L (ref 22–32)
Calcium: 8.1 mg/dL — ABNORMAL LOW (ref 8.9–10.3)
Chloride: 102 mmol/L (ref 98–111)
Creatinine, Ser: 1.48 mg/dL — ABNORMAL HIGH (ref 0.61–1.24)
GFR, Estimated: 53 mL/min — ABNORMAL LOW (ref >60)
Glucose, Bld: 87 mg/dL (ref 70–99)
Potassium: 2.9 mmol/L — ABNORMAL LOW (ref 3.5–5.1)
Sodium: 135 mmol/L (ref 135–145)

## 2020-10-08 MED ORDER — POTASSIUM CHLORIDE CRYS ER 20 MEQ PO TBCR
40.0000 meq | EXTENDED_RELEASE_TABLET | Freq: Once | ORAL | Status: AC
  Administered 2020-10-08: 40 meq via ORAL
  Filled 2020-10-08: qty 2

## 2020-10-08 NOTE — Progress Notes (Signed)
PROGRESS NOTE    Connor [name]  GDJ:[id number] DOB: 1956-04-03 DOA: 10/05/2020 PCP: Hoy Register, MD    Brief Narrative:  Connor [name] was admitted to the hospitalwith theworking diagnosis of atypical chest pain, complicated with dyspepsia and poor oral intake.   65 year old male past medical history for aortic dissection, status post repair, atrial fibrillation, coronary artery disease status post bypass grafting, chronic kidney disease, hypertension and obesity. Reported left-sided sternal chest pain, dull in nature, 2-5/10 in intensity, with radiation to both shoulders. No associated symptoms. Recent hospitalization01/4 to 01/57for severe sepsis due to necrotizing fasciitis, Streptococcus pyogenes, status post left AKA. On his initial physical examination blood pressure 106/67, heart rate 50, temperature 97.5, respiratoryrate21, oxygen saturation 96% Lungs clear to auscultation bilaterally, heart S1-S2, present rhythmic, soft abdomen, left AKA.  Sodium 136, potassium 2.9, chloride 102, bicarb 24, glucose 124, BUN 16, creatinine 1.29, magnesium 1.7, troponin I 12-12, White count 10.4, hemoglobin 9.9, hematocrit 31.9, platelets 304. SARS COVID-19 pending. Chest x-ray with hilar vascular congestion. CT chest/abdomen and pelviswith bilateral groundglass opacities, predominantly right lower lobe. No dissection.  EKG 51 bpm, left axis deviation,leftanterior fascicular block, first-degree AV block, sinus rhythm, no ST segment or T wave changes.\  Chest film with vascular congestion, CT with bilateral ground glass opacities suggestive of cardiogenic pulmonary edema.  Added furosemide 40 mg Iv x1 to target negative fluid balanced, follow strict in and out.  Patient reports now abdominal pain, that is worse with meals, positive nausea and very poor oral intake.    Assessment & Plan:   Principal Problem:   Atypical chest pain Active Problems:   Persistent atrial  fibrillation (HCC)   Hypertension   Hypoalbuminemia   Hypokalemia   History of left above knee amputation (HCC)   Dyspepsia   CKD stage G2/A2, GFR 60-89 and albumin creatinine ratio 30-299 mg/g    1. Atypical chest pain. Echocardiogram from 08/2020 with preserved LV systolic function 60 to 65% with elevated pulmonary pressure 34 mmHg.  Positive bubble study, intrapulmonary shunting. Bilateral atrial enlargement.   Patient has ruled out for acs.  2. New abdominal pain. Continue to have epigastric pain, decreased in intensity, not back to his baseline.   Continue with IV pantoprazole bid and po sucralfate, advance diet to soft.  On as needed antiemetics and analgesics.   2. Paroxysmal atrial fibrillation. On diltiazem for rate control and anticoagulation with apixaban.    3. HTN. On hydralazine, labetalol and spironolactone for blood pressure control.,   4. Obesity class 1. Calculated BMI is 33,4. Out of bed to chair as tolerated.  5. Mild protein calorie malnutrition. Continue with nutritional supplements.   6. 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  Old records personally reviewed noted cr up to 7,17 on 08/2020.   Worsening hypokalemia, renal function with serum cr up to 1,48 with bicarbonate at 25. Add 40 meq Kcl x1 and follow up on renal function in am. Hold on spironolactone for now.   7. Recent left AKA. PRN oxycodone, with good toleration. Continue with gabapentin.     Status is: Inpatient  Remains inpatient appropriate because:Inpatient level of care appropriate due to severity of illness   Dispo: The patient is from: Home              Anticipated d/c is to: Home              Anticipated d/c date is: 1 day  Patient currently is not medically stable to d/c.   Difficult to place patient No   DVT prophylaxis: Enoxaparin   Code Status:    full  Family Communication:  No family at the bedside      Subjective: Patient continue  to have abdominal, no nausea or vomiting, but not yet back to baseline.,   Objective: Vitals:   10/07/20 0523 10/07/20 0554 10/07/20 2100 10/08/20 0546  BP: (!) 139/105 132/88 134/70 125/70  Pulse: (!) 133 84 80 79  Resp: 16 18 18    Temp: 99.6 F (37.6 C)  99.4 F (37.4 C) 97.7 F (36.5 C)  TempSrc: Oral  Oral Oral  SpO2: 94% 95% 95% 94%  Weight:      Height:        Intake/Output Summary (Last 24 hours) at 10/08/2020 1014 Last data filed at 10/08/2020 0800 Gross per 24 hour  Intake 480 ml  Output 150 ml  Net 330 ml   Filed Weights   10/05/20 2322  Weight: 108.9 kg    Examination:   General: Not in pain or dyspnea, deconditioned  Neurology: Awake and alert, non focal  E 12/03/20  pallor, no icterus, oral mucosa moist Cardiovascular: No JVD. S1-S2 present, rhythmic, no gallops, rubs, or murmurs. No lower extremity edema. Pulmonary: positive breath sounds bilaterally Gastrointestinal. Abdomen tender to epigastric palpation. Skin. No rashes Musculoskeletal: no joint deformities     Data Reviewed: I have personally reviewed following labs and imaging studies  CBC: Recent Labs  Lab 10/06/20 0041 10/07/20 0543  WBC 10.4 11.4*  HGB 9.9* 10.3*  HCT 31.9* 33.6*  MCV 93.8 94.1  PLT 304 316   Basic Metabolic Panel: Recent Labs  Lab 10/06/20 0041 10/07/20 0543 10/08/20 0549  NA 136 136 135  K 2.9* 3.4* 2.9*  CL 102 101 102  CO2 24 28 25   GLUCOSE 124* 95 87  BUN 16 15 20   CREATININE 1.29* 1.18 1.48*  CALCIUM 8.9 8.6* 8.1*  MG 1.7  --   --   PHOS 4.4  --   --    GFR: Estimated Creatinine Clearance: 64.1 mL/min (A) (by C-G formula based on SCr of 1.48 mg/dL (H)). Liver Function Tests: Recent Labs  Lab 10/06/20 0041 10/07/20 0543  AST 19 18  ALT 17 15  ALKPHOS 98 97  BILITOT 0.8 1.0  PROT 8.4* 7.9  ALBUMIN 3.3* 3.1*   Recent Labs  Lab 10/06/20 0041  LIPASE 22   No results for input(s): AMMONIA in the last 168 hours. Coagulation  Profile: Recent Labs  Lab 10/07/20 0543  INR 1.6*   Cardiac Enzymes: No results for input(s): CKTOTAL, CKMB, CKMBINDEX, TROPONINI in the last 168 hours. BNP (last 3 results) No results for input(s): PROBNP in the last 8760 hours. HbA1C: No results for input(s): HGBA1C in the last 72 hours. CBG: No results for input(s): GLUCAP in the last 168 hours. Lipid Profile: No results for input(s): CHOL, HDL, LDLCALC, TRIG, CHOLHDL, LDLDIRECT in the last 72 hours. Thyroid Function Tests: No results for input(s): TSH, T4TOTAL, FREET4, T3FREE, THYROIDAB in the last 72 hours. Anemia Panel: No results for input(s): VITAMINB12, FOLATE, FERRITIN, TIBC, IRON, RETICCTPCT in the last 72 hours.    Radiology Studies: I have reviewed all of the imaging during this hospital visit personally     Scheduled Meds: . apixaban  5 mg Oral BID  . diltiazem  240 mg Oral Daily  . feeding supplement  237 mL Oral BID BM  .  gabapentin  400 mg Oral TID  . hydrALAZINE  25 mg Oral Q8H  . labetalol  400 mg Oral BID  . multivitamin with minerals  1 tablet Oral Daily  . pantoprazole (PROTONIX) IV  40 mg Intravenous Q12H  . polyethylene glycol  17 g Oral Daily  . potassium chloride  40 mEq Oral Once  . spironolactone  25 mg Oral Daily  . sucralfate  1 g Oral TID WC & HS   Continuous Infusions:   LOS: 1 day        Crew Goren Annett Gula, MD

## 2020-10-09 LAB — BASIC METABOLIC PANEL
Anion gap: 9 (ref 5–15)
BUN: 27 mg/dL — ABNORMAL HIGH (ref 8–23)
CO2: 26 mmol/L (ref 22–32)
Calcium: 8.5 mg/dL — ABNORMAL LOW (ref 8.9–10.3)
Chloride: 103 mmol/L (ref 98–111)
Creatinine, Ser: 1.52 mg/dL — ABNORMAL HIGH (ref 0.61–1.24)
GFR, Estimated: 51 mL/min — ABNORMAL LOW (ref >60)
Glucose, Bld: 115 mg/dL — ABNORMAL HIGH (ref 70–99)
Potassium: 3.7 mmol/L (ref 3.5–5.1)
Sodium: 138 mmol/L (ref 135–145)

## 2020-10-09 MED ORDER — FUROSEMIDE 10 MG/ML IJ SOLN
60.0000 mg | Freq: Once | INTRAMUSCULAR | Status: AC
  Administered 2020-10-09: 60 mg via INTRAVENOUS
  Filled 2020-10-09: qty 6

## 2020-10-09 MED ORDER — OXYCODONE HCL ER 20 MG PO T12A: 20.0000 mg | EXTENDED_RELEASE_TABLET | Freq: Two times a day (BID) | ORAL | Status: DC

## 2020-10-09 MED ORDER — OXYCODONE HCL ER 10 MG PO T12A
10.0000 mg | EXTENDED_RELEASE_TABLET | Freq: Two times a day (BID) | ORAL | Status: DC
  Administered 2020-10-09 (×2): 10 mg via ORAL
  Filled 2020-10-09 (×3): qty 1

## 2020-10-09 NOTE — Progress Notes (Signed)
PROGRESS NOTE    Connor [name]  ONG:[id number] DOB: [birth year] DOA: 10/05/2020 PCP: Connor Register, MD    Brief Narrative:  Connor [name] was admitted to the hospitalwith theworking diagnosis of atypical chest pain, complicated with dyspepsia and poor oral intake.  65 year old male past medical history for aortic dissection, status post repair, atrial fibrillation, coronary artery disease status post bypass grafting, chronic kidney disease, hypertension and obesity. Reported left-sided sternal chest pain, dull in nature, 2-5/10 in intensity, with radiation to both shoulders. No associated symptoms. Recent hospitalization01/4 to 01/27for severe sepsis due to necrotizing fasciitis, Streptococcus pyogenes, status post left AKA. On his initial physical examination blood pressure 106/67, heart rate 50, temperature 97.5, respiratoryrate21, oxygen saturation 96% Lungs clear to auscultation bilaterally, heart S1-S2, present rhythmic, soft abdomen, left AKA.  Sodium 136, potassium 2.9, chloride 102, bicarb 24, glucose 124, BUN 16, creatinine 1.29, magnesium 1.7, troponin I 12-12, White count 10.4, hemoglobin 9.9, hematocrit 31.9, platelets 304. SARS COVID-19 pending. Chest x-ray with hilar vascular congestion. CT chest/abdomen and pelviswith bilateral groundglass opacities, predominantly right lower lobe. No dissection.  EKG 51 bpm, left axis deviation,leftanterior fascicular block, first-degree AV block, sinus rhythm, no ST segment or T wave changes.  Chest film with vascular congestion, CT with bilateral ground glass opacities suggestive of cardiogenic pulmonary edema.  Required diuresis to target negative fluid balanced, follow strict in and out.  Patient reports now abdominal pain, that is worse with meals, positive nausea and very poor oral intake, improved with antiacid therapy.   Slowly improving but not yet back to baseline.    Assessment & Plan:   Principal  Problem:   Atypical chest pain Active Problems:   Persistent atrial fibrillation (HCC)   Hypertension   Hypoalbuminemia   Hypokalemia   History of left above knee amputation (HCC)   Dyspepsia   CKD stage G2/A2, GFR 60-89 and albumin creatinine ratio 30-299 mg/g    1. Atypical chest pain. Echocardiogram from 08/2020 with preserved LV systolic function 60 to 65% with elevated pulmonary pressure 34 mmHg.  Positive bubble study, intrapulmonary shunting. Bilateral atrial enlargement.   Patient has ruled out for acs.  2. NEW Gastritis. his abdominal pain has improved with the addition of antiacid therapy with pantoprazole and sucralfate. Diet advanced to soft with good toleration. Continue with as needed antiemetics.   2. Paroxysmal atrial fibrillation.Continue with diltiazem for rate control and anticoagulation with apixaban.  3. HTN.Continue with hydralazine and labetalol for blood pressure control.  4. Obesity class 1. Calculated BMI is 33,4.Continue to encourage out of bed to chair as tolerated.  5. Mild protein calorie malnutrition.On nutritional supplements.   6. AKI on CKD stage 2 with hypokalemia and hypomagnesemia.  Old records personally reviewed noted cr up to 7,17 on 08/2020.   K up to 3,7 this am, bicarbonate is 26 and serum cr up to 1,52. Clinically patient with signs of hypervolemia, will add one dose of furosemide 60 mg IV x1 and follow up on renal function in am.   7. Recent left AKA. Pain is not well controlled, will resume his long acting at a lower dose: oxycodone 10 mg bid. Continue with as needed short acting oxycodone. Patient on gabapentin.      Patient continue to be at high risk for  Worsening renal function   Status is: Inpatient  Remains inpatient appropriate because:Inpatient level of care appropriate due to severity of illness   Dispo: The patient is from: Home  Anticipated d/c is to: Home              Anticipated  d/c date is: 1 day              Patient currently is not medically stable to d/c.   Difficult to place patient No    DVT prophylaxis: apixaban   Code Status:    full  Family Communication:  No family at the bedside, patient lives with a cousin.       Subjective: Patient with positive dyspnea on exertion, his po intake has improved but he is not yet back to baseline.  Last night night had light headedness and dizziness.   Objective: Vitals:   10/08/20 1328 10/08/20 1458 10/08/20 1734 10/08/20 2044  BP: 121/72 (!) 96/59 (!) 131/93 105/66  Pulse: 68 74  71  Resp: 18 16    Temp: 98.9 F (37.2 C) 98.7 F (37.1 C)  100.2 F (37.9 C)  TempSrc: Oral Oral  Oral  SpO2: 97% 100%  95%  Weight:      Height:        Intake/Output Summary (Last 24 hours) at 10/09/2020 0853 Last data filed at 10/09/2020 0500 Gross per 24 hour  Intake 600 ml  Output 250 ml  Net 350 ml   Filed Weights   10/05/20 2322  Weight: 108.9 kg    Examination:   General: Not in pain. Mild dyspnea at rest.  Neurology: Awake and alert, non focal  E ENT: mild pallor, no icterus, oral mucosa moist. Positive JVD  Cardiovascular: No JVD. S1-S2 present, rhythmic, no gallops, rubs, or murmurs. No lower extremity edema. Pulmonary: positive breath sounds bilaterally, with no wheezing, rhonchi or rales. Gastrointestinal. Abdomen soft but tender at the epigastrium.  Skin. No rashes Musculoskeletal: no joint deformities     Data Reviewed: I have personally reviewed following labs and imaging studies  CBC: Recent Labs  Lab 10/06/20 0041 10/07/20 0543  WBC 10.4 11.4*  HGB 9.9* 10.3*  HCT 31.9* 33.6*  MCV 93.8 94.1  PLT 304 316   Basic Metabolic Panel: Recent Labs  Lab 10/06/20 0041 10/07/20 0543 10/08/20 0549 10/09/20 0540  NA 136 136 135 138  K 2.9* 3.4* 2.9* 3.7  CL 102 101 102 103  CO2 24 28 25 26   GLUCOSE 124* 95 87 115*  BUN 16 15 20  27*  CREATININE 1.29* 1.18 1.48* 1.52*  CALCIUM 8.9 8.6*  8.1* 8.5*  MG 1.7  --   --   --   PHOS 4.4  --   --   --    GFR: Estimated Creatinine Clearance: 62.4 mL/min (A) (by C-G formula based on SCr of 1.52 mg/dL (H)). Liver Function Tests: Recent Labs  Lab 10/06/20 0041 10/07/20 0543  AST 19 18  ALT 17 15  ALKPHOS 98 97  BILITOT 0.8 1.0  PROT 8.4* 7.9  ALBUMIN 3.3* 3.1*   Recent Labs  Lab 10/06/20 0041  LIPASE 22   No results for input(s): AMMONIA in the last 168 hours. Coagulation Profile: Recent Labs  Lab 10/07/20 0543  INR 1.6*   Cardiac Enzymes: No results for input(s): CKTOTAL, CKMB, CKMBINDEX, TROPONINI in the last 168 hours. BNP (last 3 results) No results for input(s): PROBNP in the last 8760 hours. HbA1C: No results for input(s): HGBA1C in the last 72 hours. CBG: No results for input(s): GLUCAP in the last 168 hours. Lipid Profile: No results for input(s): CHOL, HDL, LDLCALC, TRIG, CHOLHDL, LDLDIRECT in the  last 72 hours. Thyroid Function Tests: No results for input(s): TSH, T4TOTAL, FREET4, T3FREE, THYROIDAB in the last 72 hours. Anemia Panel: No results for input(s): VITAMINB12, FOLATE, FERRITIN, TIBC, IRON, RETICCTPCT in the last 72 hours.    Radiology Studies: I have reviewed all of the imaging during this hospital visit personally     Scheduled Meds: . apixaban  5 mg Oral BID  . diltiazem  240 mg Oral Daily  . feeding supplement  237 mL Oral BID BM  . gabapentin  400 mg Oral TID  . hydrALAZINE  25 mg Oral Q8H  . labetalol  400 mg Oral BID  . multivitamin with minerals  1 tablet Oral Daily  . oxyCODONE  20 mg Oral Q12H  . pantoprazole (PROTONIX) IV  40 mg Intravenous Q12H  . polyethylene glycol  17 g Oral Daily  . sucralfate  1 g Oral TID WC & HS   Continuous Infusions:   LOS: 2 days        Daimian Sudberry Annett Gula, MD

## 2020-10-10 ENCOUNTER — Encounter: Payer: Medicaid Other | Admitting: Registered Nurse

## 2020-10-10 DIAGNOSIS — K2970 Gastritis, unspecified, without bleeding: Secondary | ICD-10-CM

## 2020-10-10 DIAGNOSIS — I480 Paroxysmal atrial fibrillation: Secondary | ICD-10-CM

## 2020-10-10 DIAGNOSIS — K2900 Acute gastritis without bleeding: Principal | ICD-10-CM

## 2020-10-10 LAB — BASIC METABOLIC PANEL
Anion gap: 10 (ref 5–15)
BUN: 29 mg/dL — ABNORMAL HIGH (ref 8–23)
CO2: 26 mmol/L (ref 22–32)
Calcium: 8.7 mg/dL — ABNORMAL LOW (ref 8.9–10.3)
Chloride: 105 mmol/L (ref 98–111)
Creatinine, Ser: 1.48 mg/dL — ABNORMAL HIGH (ref 0.61–1.24)
GFR, Estimated: 53 mL/min — ABNORMAL LOW (ref >60)
Glucose, Bld: 88 mg/dL (ref 70–99)
Potassium: 3.4 mmol/L — ABNORMAL LOW (ref 3.5–5.1)
Sodium: 141 mmol/L (ref 135–145)

## 2020-10-10 MED ORDER — ACETAMINOPHEN 325 MG PO TABS: 650.0000 mg | ORAL_TABLET | Freq: Four times a day (QID) | ORAL | Status: AC | PRN

## 2020-10-10 MED ORDER — ENSURE ENLIVE PO LIQD: 237.0000 mL | Freq: Two times a day (BID) | ORAL | 0 refills | Status: AC

## 2020-10-10 MED ORDER — OXYCODONE HCL 5 MG PO TABS
5.0000 mg | ORAL_TABLET | Freq: Four times a day (QID) | ORAL | 0 refills | Status: DC | PRN
Start: 2020-10-10 — End: 2020-10-24

## 2020-10-10 MED ORDER — PANTOPRAZOLE SODIUM 40 MG PO TBEC
40.0000 mg | DELAYED_RELEASE_TABLET | Freq: Every day | ORAL | Status: DC
  Administered 2020-10-10: 40 mg via ORAL
  Filled 2020-10-10: qty 1

## 2020-10-10 MED ORDER — PANTOPRAZOLE SODIUM 40 MG PO TBEC: 40.0000 mg | DELAYED_RELEASE_TABLET | Freq: Every day | ORAL | 0 refills | Status: DC

## 2020-10-10 MED ORDER — FUROSEMIDE 40 MG PO TABS: ORAL_TABLET | ORAL | 0 refills | Status: DC

## 2020-10-10 MED ORDER — ACETAMINOPHEN 325 MG PO TABS: 650.0000 mg | ORAL_TABLET | Freq: Four times a day (QID) | ORAL | Status: DC | PRN

## 2020-10-10 MED ORDER — OXYCODONE HCL ER 20 MG PO T12A
20.0000 mg | EXTENDED_RELEASE_TABLET | Freq: Two times a day (BID) | ORAL | Status: DC
  Administered 2020-10-10: 20 mg via ORAL
  Filled 2020-10-10: qty 1

## 2020-10-10 NOTE — Discharge Summary (Addendum)
Physician Discharge Summary  Zion Ta QPR:[id number] DOB: [birth year] DOA: 10/05/2020  PCP: Hoy Register, MD  Admit date: 10/05/2020 Discharge date: 10/10/2020  Admitted From: Home  Disposition:   Home   Recommendations for Outpatient Follow-up and new medication changes:  1. Follow up with Dr. Alvis Lemmings in 7 days.  2. Discontinue spironolactone due risk of hyperkalemia 3. Added as needed furosemide for edema or dyspnea.   Home Health: yes  Equipment/Devices: no    Discharge Condition: stable  CODE STATUS: full  Diet recommendation:  Hearth healthy and renal prudent.   Brief/Interim Summary: Mr. Quackenbush was admitted to the hospitalwith theworking diagnosis of atypical chest pain, complicated with dyspepsia and poor oral intake.  65 year old male past medical history for aortic dissection, status post repair, atrial fibrillation, coronary artery disease status post bypass grafting, chronic kidney disease, hypertension and obesity. Reported left-sided sternal chest pain, dull in nature, 2-5/10 in intensity, with radiation to both shoulders. No associated symptoms. Recent hospitalization01/4 to 01/23for severe sepsis due to necrotizing fasciitis, Streptococcus pyogenes, status post left AKA. On his initial physical examination blood pressure 106/67, heart rate 50, temperature 97.5, respiratoryrate21, oxygen saturation 96% Lungs clear to auscultation bilaterally, heart S1-S2, present rhythmic, soft abdomen, left AKA.  Sodium 136, potassium 2.9, chloride 102, bicarb 24, glucose 124, BUN 16, creatinine 1.29, magnesium 1.7, troponin I 12-12, White count 10.4, hemoglobin 9.9, hematocrit 31.9, platelets 304. SARS COVID-19 pending. Chest x-ray with hilar vascular congestion. CT chest/abdomen and pelviswith bilateral groundglass opacities, predominantly right lower lobe. No dissection.  EKG 51 bpm, left axis deviation,leftanterior fascicular block, first-degree AV block,  sinus rhythm, no ST segment or T wave changes.  Chest film with vascular congestion, CT with bilateral ground glass opacities suggestive of cardiogenic pulmonary edema.  Required diuresis to target negative fluid balanced.  Patient reported during his hospitalization predominantely abdominal pain, that was worse with meals, positive nausea and very poor oral intake, improved with antiacid therapy.   Slowly improving continue outpatient follow up.   1.  Atypical chest pain.  Patient ruled out for acute coronary syndrome.  Echocardiography from 08/2020 with preserved LV systolic function, ejection fraction 60 to 65%, elevated pulmonary pressure at 34 mmHg.  Positive bubble study, intrapulmonary shunting.  Bilateral atrial enlargement.  2.  Acute gastritis.  Likely his symptoms coming from acute gastritis.  No signs of gastrointestinal bleeding.  Patient was placed on intravenous proton pump inhibitor twice daily along with sucralfate. Initially on a clear liquid diet then advance to soft with good toleration.  Patient will continue taking Protonix 40 g daily for 14 days.  If symptoms reoccur to consider further work-up with endoscopy.  3.  Paroxysmal atrial fibrillation.  Patient remains rate controlled, continue diltiazem and anticoagulation with apixaban.  4.  Hypertension.  Continue blood pressure control with hydralazine and labetalol.  5.  Obesity class I/mild protein calorie malnutrition.  Calculated BMI 33.4. Continue with nutritional supplements.  6.  Acute kidney injury chronic kidney disease stage II, hypokalemia/hypomagnesemia. Old records personally reviewed, 08/2020, peak creatinine 7.17. Patient had signs of hypervolemia during his hospitalization, he received intravenous furosemide with improvement of his volume status.  At discharge sodium 141, potassium 3.4, chloride 105, bicarb 26, glucose 88, BUN 29, creatinine 1.48. Add furosemide as needed for dyspnea or edema.   For now hold on spironolactone due to risk of hyperkalemia in the setting of chronic kidney disease.  7.  Recent left AKA.  Patient was resumed on his analgesic regimen with  oxycodone.  Continue gabapentin. Home health has been arranged.  Discharge Diagnoses:  Principal Problem:   Atypical chest pain Active Problems:   Hypertension   Hypokalemia   History of left above knee amputation (HCC)   CKD stage G2/A2, GFR 60-89 and albumin creatinine ratio 30-299 mg/g   Paroxysmal A-fib (HCC)   Gastritis    Discharge Instructions   Allergies as of 10/10/2020   No Known Allergies     Medication List    STOP taking these medications   methocarbamol 500 MG tablet Commonly known as: ROBAXIN   spironolactone 25 MG tablet Commonly known as: ALDACTONE     TAKE these medications   acetaminophen 325 MG tablet Commonly known as: TYLENOL Take 2 tablets (650 mg total) by mouth every 6 (six) hours as needed for moderate pain.   diclofenac Sodium 1 % Gel Commonly known as: VOLTAREN Apply 2 g topically 4 (four) times daily.   diltiazem 240 MG 24 hr capsule Commonly known as: CARDIZEM CD Take 1 capsule (240 mg total) by mouth daily.   Eliquis 5 MG Tabs tablet Generic drug: apixaban Take 1 tablet (5 mg total) by mouth 2 (two) times daily.   feeding supplement Liqd Take 237 mLs by mouth 2 (two) times daily between meals.   furosemide 40 MG tablet Commonly known as: Lasix Take once daily for swelling or shortness of breath.   gabapentin 400 MG capsule Commonly known as: NEURONTIN Take 1 capsule (400 mg total) by mouth 3 (three) times daily.   hydrALAZINE 25 MG tablet Commonly known as: APRESOLINE Take 1 tablet (25 mg total) by mouth every 8 (eight) hours.   labetalol 200 MG tablet Commonly known as: NORMODYNE Take 2 tablets (400 mg total) by mouth 2 (two) times daily.   multivitamin with minerals tablet Take 1 tablet by mouth daily. Men   oxyCODONE 5 MG immediate release  tablet Commonly known as: Oxy IR/ROXICODONE Take 1 tablet (5 mg total) by mouth every 6 (six) hours as needed for severe pain. What changed:   how much to take  when to take this  reasons to take this  Another medication with the same name was removed. Continue taking this medication, and follow the directions you see here.   pantoprazole 40 MG tablet Commonly known as: PROTONIX Take 1 tablet (40 mg total) by mouth daily for 14 days.   polyethylene glycol 17 g packet Commonly known as: MIRALAX / GLYCOLAX Take 17 g by mouth daily.       No Known Allergies      Procedures/Studies: DG Chest 1 View  Result Date: 10/06/2020 CLINICAL DATA:  Chest pain EXAM: CHEST  1 VIEW COMPARISON:  August 30, 2020 FINDINGS: Foot there is stable cardiomegaly. The patient is status post intrathoracic aortic stent placement. There appears to be slightly enlarged appearance of the aortic knob and descending intrathoracic aorta when in comparison to the prior exam. No large airspace consolidation or pleural effusion. Surgical clips seen within the right adnexa. IMPRESSION: Status post aortic stent with interval slight enlargement of the intrathoracic descending aorta. Would recommend CT for further evaluation. Electronically Signed   By: Jonna Clark M.D.   On: 10/06/2020 00:41   CT EXTREMITY LOWER LEFT WO CONTRAST  Result Date: 09/17/2020 CLINICAL DATA:  Sepsis.  Soft tissue infection suspected EXAM: CT OF THE LOWER LEFT EXTREMITY WITHOUT CONTRAST CT OF THE LOWER RIGHT EXTREMITY WITHOUT CONTRAST TECHNIQUE: Multidetector CT imaging of the lower left extremity and lower  right extremity was performed according to the standard protocol. 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 No erosion or periostitis at the resection margin to suggest osteomyelitis. No acute fracture or malalignment. No evidence of femoral head  avascular necrosis. Ligaments Suboptimally assessed by CT. Muscles and Tendons Postsurgical changes to the left thigh musculature. No acute tendinous injury. Soft tissues There is an ill-defined mixed density fluid collection within the soft tissues at the distal stump laterally. There are mixed areas of high attenuation fluid with areas of more decreased attenuation as well as multiple locules of soft tissue gas. Approximate measurements of the collection are 10.3 cm AP x 5.3 cm TR x 12.4 cm CC (series 6, image 266; series 9, image 103). Collection approximates the surgical incision site as evident by overlying skin staples. No foci of gas within the soft tissues tracking beyond the site of fluid collection. Subcutaneous edema within the left thigh most pronounced anteriorly at the level of the hip. No deep fascial fluid collection. Multiple mildly enlarged left inguinal lymph nodes are again seen. Subcutaneous edema throughout the right lower extremity most pronounced along the lateral aspect of the knee and calf. No organized fluid collection. No soft tissue gas within the right lower extremity. No deep fascial fluid collections are evident. For findings within the pelvis. Please see concurrently obtained dedicated CT of the chest, abdomen, and pelvis. IMPRESSION: 1. Ill-defined mixed density fluid collection within the soft tissues at the distal stump laterally measuring approximately 12.4 x 5.3 x 10.3 cm. Findings are suggestive of hematoma. There are multiple foci of air within the collection, which may be related to recent surgery. Superimposed infected collection not excluded. 2. No evidence of osteomyelitis by CT. 3. Nonspecific subcutaneous edema within the left thigh and throughout the right lower extremity, which may reflect cellulitis. No additional organized fluid collection. 4. Multiple mildly enlarged left inguinal lymph nodes, likely reactive. 5. For findings within the pelvis. Please see  concurrently obtained dedicated CT of the chest, abdomen, and pelvis. Electronically Signed   By: Duanne Guess D.O.   On: 09/17/2020 13:26   CT EXTREMITY LOWER RIGHT WO CONTRAST  Result Date: 09/17/2020 CLINICAL DATA:  Sepsis.  Soft tissue infection suspected EXAM: CT OF THE LOWER LEFT EXTREMITY WITHOUT CONTRAST CT OF THE LOWER RIGHT EXTREMITY WITHOUT CONTRAST TECHNIQUE: Multidetector CT imaging of the lower left extremity and lower right extremity was performed according to the standard protocol. 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 No erosion or periostitis at the resection margin to suggest osteomyelitis. No acute fracture or malalignment. No evidence of femoral head avascular necrosis. Ligaments Suboptimally assessed by CT. Muscles and Tendons Postsurgical changes to the left thigh musculature. No acute tendinous injury. Soft tissues There is an ill-defined mixed density fluid collection within the soft tissues at the distal stump laterally. There are mixed areas of high attenuation fluid with areas of more decreased attenuation as well as multiple locules of soft tissue gas. Approximate measurements of the collection are 10.3 cm AP x 5.3 cm TR x 12.4 cm CC (series 6, image 266; series 9, image 103). Collection approximates the surgical incision site as evident by overlying skin staples. No foci of gas within the soft tissues tracking beyond the site of fluid collection. Subcutaneous edema within the left thigh most pronounced anteriorly at the level of the hip. No  deep fascial fluid collection. Multiple mildly enlarged left inguinal lymph nodes are again seen. Subcutaneous edema throughout the right lower extremity most pronounced along the lateral aspect of the knee and calf. No organized fluid collection. No soft tissue gas within the right lower extremity. No deep fascial fluid collections are evident.  For findings within the pelvis. Please see concurrently obtained dedicated CT of the chest, abdomen, and pelvis. IMPRESSION: 1. Ill-defined mixed density fluid collection within the soft tissues at the distal stump laterally measuring approximately 12.4 x 5.3 x 10.3 cm. Findings are suggestive of hematoma. There are multiple foci of air within the collection, which may be related to recent surgery. Superimposed infected collection not excluded. 2. No evidence of osteomyelitis by CT. 3. Nonspecific subcutaneous edema within the left thigh and throughout the right lower extremity, which may reflect cellulitis. No additional organized fluid collection. 4. Multiple mildly enlarged left inguinal lymph nodes, likely reactive. 5. For findings within the pelvis. Please see concurrently obtained dedicated CT of the chest, abdomen, and pelvis. Electronically Signed   By: Duanne Guess D.O.   On: 09/17/2020 13:26   CT Angio Chest/Abd/Pel for Dissection W and/or Wo Contrast  Result Date: 10/06/2020 CLINICAL DATA:  Thoracic aortic disease.  Abdominal pain. EXAM: CT ANGIOGRAPHY CHEST, ABDOMEN AND PELVIS TECHNIQUE: Non-contrast CT of the chest was initially obtained. Multidetector CT imaging through the chest, abdomen and pelvis was performed using the standard protocol during bolus administration of intravenous contrast. Multiplanar reconstructed images and MIPs were obtained and reviewed to evaluate the vascular anatomy. CONTRAST:  OMNIPAQUE IOHEXOL 350 MG/ML SOLN COMPARISON:  Noncontrast CT chest, abdomen and pelvis 09/17/2020. CT a abdomen and pelvis 03/01/2020. FINDINGS: CTA CHEST FINDINGS Cardiovascular: Patient is status post tube graft repair of the ascending thoracic aorta hand stent graft placement in the aortic arch. Dissection is again noted involving the distal aortic arch and descending thoracic aorta, predominantly thrombosed from the proximal to mid thoracic aorta to the aorta hiatus. Maximum diameter  approximately 5.6 cm, stable since most recent noncontrast chest CT. Cardiomegaly. Mediastinum/Nodes: No mediastinal, hilar, or axillary adenopathy. Trachea and esophagus are unremarkable. Thyroid unremarkable. Lungs/Pleura: Vascular congestion. No confluent opacities or effusions. Musculoskeletal: Chest wall soft tissues are unremarkable. No acute bony abnormality. Review of the MIP images confirms the above findings. CTA ABDOMEN AND PELVIS FINDINGS VASCULAR Aorta: Aortic dissection continues into the abdominal aorta where the false lumen remains patent. Stent noted across the dissection flap at the level of the renal arteries to allow communication from true false lumen. Maximum diameter 4.5 cm at the level of the SMA. Celiac: Dissection flap extends into the celiac artery which is patent. SMA: Dissection flap extends into the SMA which is patent. Renals: Left renal artery arises from the false lumen with vascular stent in place. Vessel is patent. Right renal artery arises from the true lumen and is patent. IMA: Arises from the false lumen and is patent. Inflow: Stent extends from the true lumen into the left common iliac artery which is ectatic measuring up 2 1.8 cm, stable. Right common iliac artery is patent, arising from the true lumen. The dissection extends into the left internal iliac artery. Veins: No obvious venous abnormality within the limitations of this arterial phase study. Review of the MIP images confirms the above findings. NON-VASCULAR Hepatobiliary: Layering gallstones within the gallbladder. No focal hepatic abnormality. Pancreas: No focal abnormality or ductal dilatation. Spleen: No focal abnormality.  Normal size. Adrenals/Urinary Tract: Symmetrically enhancing kidneys. No  hydronephrosis. Adrenal glands and urinary bladder unremarkable. Stomach/Bowel: Normal appendix. Stomach, large and small bowel grossly unremarkable. Lymphatic: No adenopathy Reproductive: Mildly prominent prostate. Other:  No free fluid or free air. Musculoskeletal: No acute bony abnormality. Review of the MIP images confirms the above findings. IMPRESSION: Complex thoracoabdominal aneurysm and dissection, status post repair of the ascending thoracic aorta and stent graft in the aortic arch. Overall size and configuration is essentially unchanged since prior studies. Cardiomegaly, vascular congestion. Cholelithiasis. No acute findings. Electronically Signed   By: Charlett Nose M.D.   On: 10/06/2020 02:00   CT CHEST ABDOMEN PELVIS WO CONTRAST  Result Date: 09/17/2020 CLINICAL DATA:  Suspected retroperitoneal hematoma . Anemia. Severe sepsis and acute renal failure. Necrotizing fasciitis. 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 EXAM: CT CHEST, ABDOMEN AND PELVIS WITHOUT CONTRAST TECHNIQUE: Multidetector CT imaging of the chest, abdomen and pelvis was performed following the standard protocol without IV contrast. COMPARISON:  09/01/2020 abdominal ultrasound. 03/01/2020 CTA of the chest, abdomen, and pelvis. FINDINGS: CT CHEST FINDINGS Cardiovascular: Mild motion degradation. Status post ascending aortic repair and transverse aortic stent graft. The descending thoracic aorta measures on the order of 5.3 cm on 39/3, similar to on the prior. The dissection is not well evaluated on this noncontrast exam. Tortuous descending thoracic aorta. Moderate cardiomegaly, without pericardial effusion. Lad coronary artery calcification. Pulmonary artery enlargement, outflow tract 3.5 cm. Mediastinum/Nodes: Right chest wall surgical clips. Right paratracheal node of 1.7 cm on 27/3, similar. Hilar regions poorly evaluated without intravenous contrast. Lungs/Pleura: Trace bilateral pleural fluid, new since prior CT. Left base scarring or subsegmental atelectasis. Posterior left upper lobe dependent atelectasis as well. Musculoskeletal: Marked bilateral gynecomastia. Prior median sternotomy. No acute osseous abnormality. CT ABDOMEN PELVIS  FINDINGS Hepatobiliary: Hepatomegaly at 21.4 cm craniocaudal. Dependent gallstones. No acute cholecystitis or biliary duct dilatation. Pancreas: Normal, without mass or ductal dilatation. Spleen: Normal in size, without focal abnormality. Adrenals/Urinary Tract: Normal adrenal glands. No renal calculi or hydronephrosis. No hydroureter or ureteric calculi. No bladder calculi. Stomach/Bowel: Normal stomach, without wall thickening. Colonic stool burden suggests constipation. Normal terminal ileum and appendix. Normal small bowel. Vascular/Lymphatic: Left renal artery stent. Left common and external iliac artery stents. No pelvic or abdominal retroperitoneal hemorrhage/hematoma. No abdominopelvic adenopathy. Reproductive: Normal prostate. Other: No significant free fluid. No free intraperitoneal air. Extensive subcutaneous edema throughout the proximal left lower extremity. Intramuscular edema and fluid are incompletely imaged, including on 136/3. Musculoskeletal: No acute osseous abnormality. IMPRESSION: 1. Subcutaneous and intramuscular edema/fluid throughout the imaged proximal left lower extremity. This will be more completely evaluated on dedicated extremity CT, dictated separately. 2. No pelvic or abdominal retroperitoneal hematoma identified. 3. Similar appearance of the thoracic aorta, status post ascending aortic repair and transverse aortic stent graft. 4. Aortic atherosclerosis (ICD10-I70.0) and emphysema (ICD10-J43.9). 5. Tiny bilateral pleural effusions. 6. Cholelithiasis. 7.  Possible constipation. 8. Pulmonary artery enlargement suggests pulmonary arterial hypertension. 9. Mild thoracic adenopathy, favored to be reactive. 10. Bilateral marked gynecomastia. Electronically Signed   By: Jeronimo Greaves M.D.   On: 09/17/2020 13:26        Subjective: Patient is feeling better, slowly returning to his baseline, no nausea or vomiting, his abdominal pain has improved.  Had difficulty sleeping last night.    Discharge Exam: Vitals:   10/09/20 2138 10/10/20 0529  BP: 120/71 133/67  Pulse: 72 65  Resp: 16 16  Temp: 98.4 F (36.9 C) 98.7 F (37.1 C)  SpO2: 98% 95%   Vitals:  10/09/20 1500 10/09/20 2136 10/09/20 2138 10/10/20 0529  BP: 111/64 123/68 120/71 133/67  Pulse:  73 72 65  Resp:  16 16 16   Temp: 98.2 F (36.8 C) 98.6 F (37 C) 98.4 F (36.9 C) 98.7 F (37.1 C)  TempSrc: Oral Oral  Oral  SpO2: 97% 100% 98% 95%  Weight:      Height:        General: Not in pain.  Neurology: Awake and alert, non focal  E ENT: no pallor, no icterus, oral mucosa moist Cardiovascular: No JVD. S1-S2 present, rhythmic, no gallops, rubs, or murmurs. No lower extremity edema. Pulmonary: positive breath sounds bilaterally, adequate air movement, no wheezing, rhonchi or rales. Gastrointestinal. Abdomen soft and non tender Skin. No rashes Musculoskeletal: left AKA with clean wound   The results of significant diagnostics from this hospitalization (including imaging, microbiology, ancillary and laboratory) are listed below for reference.     Microbiology: Recent Results (from the past 240 hour(s))  SARS CORONAVIRUS 2 (TAT 6-24 HRS) Nasopharyngeal Nasopharyngeal Swab     Status: None   Collection Time: 10/06/20  6:30 AM   Specimen: Nasopharyngeal Swab  Result Value Ref Range Status   SARS Coronavirus 2 NEGATIVE NEGATIVE Final    Comment: (NOTE) SARS-CoV-2 target nucleic acids are NOT DETECTED.  The SARS-CoV-2 RNA is generally detectable in upper and lower respiratory specimens during the acute phase of infection. Negative results do not preclude SARS-CoV-2 infection, do not rule out co-infections with other pathogens, and should not be used as the sole basis for treatment or other patient management decisions. Negative results must be combined with clinical observations, patient history, and epidemiological information. The expected result is Negative.  Fact Sheet for  Patients: HairSlick.nohttps://www.fda.gov/media/138098/download  Fact Sheet for Healthcare Providers: quierodirigir.comhttps://www.fda.gov/media/138095/download  This test is not yet approved or cleared by the [place] FDA and  has been authorized for detection and/or diagnosis of SARS-CoV-2 by FDA under an Emergency Use Authorization (EUA). This EUA will remain  in effect (meaning this test can be used) for the duration of the COVID-19 declaration under Se ction 564(b)(1) of the Act, 21 U.S.C. section 360bbb-3(b)(1), unless the authorization is terminated or revoked sooner.  Performed at [facility], [address] [place], [place] [address]      Labs: BNP (last 3 results) No results for input(s): BNP in the last 8760 hours. Basic Metabolic Panel: Recent Labs  Lab 10/06/20 0041 10/07/20 0543 10/08/20 0549 10/09/20 0540 10/10/20 0421  NA 136 136 135 138 141  K 2.9* 3.4* 2.9* 3.7 3.4*  CL 102 101 102 103 105  CO2 24 28 25 26 26   GLUCOSE 124* 95 87 115* 88  BUN 16 15 20  27* 29*  CREATININE 1.29* 1.18 1.48* 1.52* 1.48*  CALCIUM 8.9 8.6* 8.1* 8.5* 8.7*  MG 1.7  --   --   --   --   PHOS 4.4  --   --   --   --    Liver Function Tests: Recent Labs  Lab 10/06/20 0041 10/07/20 0543  AST 19 18  ALT 17 15  ALKPHOS 98 97  BILITOT 0.8 1.0  PROT 8.4* 7.9  ALBUMIN 3.3* 3.1*   Recent Labs  Lab 10/06/20 0041  LIPASE 22   No results for input(s): AMMONIA in the last 168 hours. CBC: Recent Labs  Lab 10/06/20 0041 10/07/20 0543  WBC 10.4 11.4*  HGB 9.9* 10.3*  HCT 31.9* 33.6*  MCV 93.8 94.1  PLT 304 316   Cardiac Enzymes: No results for input(s): CKTOTAL, CKMB, CKMBINDEX, TROPONINI in the last 168 hours. BNP: Invalid input(s): POCBNP CBG: No results for input(s): GLUCAP in the last 168 hours. D-Dimer No results for input(s): DDIMER in the last 72 hours. Hgb A1c No results for input(s): HGBA1C in the last 72 hours. Lipid Profile No results for input(s): CHOL, HDL, LDLCALC,  TRIG, CHOLHDL, LDLDIRECT in the last 72 hours. Thyroid function studies No results for input(s): TSH, T4TOTAL, T3FREE, THYROIDAB in the last 72 hours.  Invalid input(s): FREET3 Anemia work up No results for input(s): VITAMINB12, FOLATE, FERRITIN, TIBC, IRON, RETICCTPCT in the last 72 hours. Urinalysis    Component Value Date/Time   COLORURINE AMBER (A) 08/31/2020 0130   APPEARANCEUR CLOUDY (A) 08/31/2020 0130   LABSPEC 1.017 08/31/2020 0130   PHURINE 5.0 08/31/2020 0130   GLUCOSEU NEGATIVE 08/31/2020 0130   HGBUR LARGE (A) 08/31/2020 0130   BILIRUBINUR NEGATIVE 08/31/2020 0130   KETONESUR NEGATIVE 08/31/2020 0130   PROTEINUR 100 (A) 08/31/2020 0130   NITRITE NEGATIVE 08/31/2020 0130   LEUKOCYTESUR NEGATIVE 08/31/2020 0130   Sepsis Labs Invalid input(s): PROCALCITONIN,  WBC,  LACTICIDVEN Microbiology Recent Results (from the past 240 hour(s))  SARS CORONAVIRUS 2 (TAT 6-24 HRS) Nasopharyngeal Nasopharyngeal Swab     Status: None   Collection Time: 10/06/20  6:30 AM   Specimen: Nasopharyngeal Swab  Result Value Ref Range Status   SARS Coronavirus 2 NEGATIVE NEGATIVE Final    Comment: (NOTE) SARS-CoV-2 target nucleic acids are NOT DETECTED.  The SARS-CoV-2 RNA is generally detectable in upper and lower respiratory specimens during the acute phase of infection. Negative results do not preclude SARS-CoV-2 infection, do not rule out co-infections with other pathogens, and should not be used as the sole basis for treatment or other patient management decisions. Negative results must be combined with clinical observations, patient history, and epidemiological information. The expected result is Negative.  Fact Sheet for Patients: HairSlick.no  Fact Sheet for Healthcare Providers: quierodirigir.com  This test is not yet approved or cleared by the [place] FDA and  has been authorized for detection and/or diagnosis of  SARS-CoV-2 by FDA under an Emergency Use Authorization (EUA). This EUA will remain  in effect (meaning this test can be used) for the duration of the COVID-19 declaration under Se ction 564(b)(1) of the Act, 21 U.S.C. section 360bbb-3(b)(1), unless the authorization is terminated or revoked sooner.  Performed at [facility], [address], [place], [place] [address]      Time coordinating discharge: 45 minutes  SIGNED:   Coralie Keens, MD  [facility] 10/10/2020, 8:45 AM

## 2020-10-10 NOTE — TOC Transition Note (Signed)
Transition of Care [facility]) - CM/SW Discharge Note   Patient Details  Name: Goldman Birchall MRN: [id number] Date of Birth: 09-03-1955  Transition of Care [facility]) CM/SW Contact:  Karn Cassis, LCSW Phone Number: 10/10/2020, 12:23 PM   Clinical Narrative:   Pt d/c today. LCSW notified Bonita Quin with [facility] that PT, OT, RN orders are in and pt will d/c today. Pt states he does not have a ride. [employer] contacted and ride scheduled for this afternoon. RN updated.     Final next level of care: Home w Home Health Services Barriers to Discharge: Barriers Resolved   Patient Goals and CMS Choice Patient states their goals for this hospitalization and ongoing recovery are:: Home with [facility]   Choice offered to / list presented to : NA  Discharge Placement                       Discharge Plan and Services In-house Referral: NA Discharge Planning Services: NA Post Acute Care Choice: Home Health          DME Arranged: N/A DME Agency: NA       HH Arranged: PT,OT,RN HH Agency: [facility] Date HH Agency Contacted: 10/10/20 Time HH Agency Contacted: 1223 Representative spoke with at [facility] Agency: Bonita Quin  Social Determinants of Health (SDOH) Interventions     Readmission Risk Interventions Readmission Risk Prevention Plan 10/07/2020  Transportation Screening Complete  HRI or Home Care Consult Complete  Social Work Consult for Recovery Care Planning/Counseling Complete  Palliative Care Screening Not Applicable  Medication Review [employer]) Complete

## 2020-10-11 ENCOUNTER — Telehealth: Payer: Self-pay

## 2020-10-11 NOTE — Telephone Encounter (Signed)
Transition Care Management Unsuccessful Follow-up Telephone Call  Date of discharge and from where:  10/10/2020, [facility]   Attempts:  1st Attempt  Reason for unsuccessful TCM follow-up call:  Unable to leave message - voicemail full # [phone].  Patient has appointment with Dr Alvis Lemmings at [facility] 10/12/2020

## 2020-10-12 ENCOUNTER — Telehealth: Payer: Self-pay

## 2020-10-12 ENCOUNTER — Inpatient Hospital Stay: Payer: Medicaid Other | Admitting: Family Medicine

## 2020-10-12 NOTE — Telephone Encounter (Signed)
From the discharge call.    He said he is doing all right.     He said he can drive but can't get into his vehicle. He will need to rely on others for transportation. He said that he could not get to his appointment at [facility] today. Informed him to call the clinic in the future if he is unable to keep an appointment due to transportation difficulties.  A virtual visit may be able to be done instead of in person , or if a day's notice is given, [facility] transportation may be arranged.      Also informed him that he needs to contact his insurance company because medicaid will pay for transportation to medical appointments.     Explained to him that Dr Alvis Lemmings may be able to do a virtual visit with him today if she has a cancellation/opening. Instructed him to make sure that he answers all calls as he will be called today if an appointment is available and he said he understood.  In the meantime, an appointment was scheduled with Dr Alvis Lemmings for 10/20/2020.     he said he has all medications but does not have a feeding supplement.    independent, uses walker with ambulation. Left BKA

## 2020-10-12 NOTE — Telephone Encounter (Signed)
Transition Care Management Follow-up Telephone Call  Date of discharge and from where: 10/10/2020[facility]  How have you been since you were released from the hospital? He said he is doing all right.   Any questions or concerns? Yes - regarding transportation.  He said he can drive but can't get into his vehicle. He will need to rely on others for transportation. He said that he could not get to his appointment at [facility] today. Informed him to call the clinic in the future if he is unable to keep an appointment due to transportation difficulties.  A virtual visit may be able to be done instead of in person , or if a day's notice is given, [facility] transportation may be arranged.    Also informed him that he needs to contact his insurance company because medicaid will pay for transportation to medical appointments.   Explained to him that Dr Alvis Lemmings may be able to do a virtual visit with him today if she has a cancellation/opening. Instructed him to make sure that he answers all calls as he will be called today if an appointment is available and he said he understood.  In the meantime, an appointment was scheduled with Dr Alvis Lemmings for 10/20/2020.   Items Reviewed:  Did the pt receive and understand the discharge instructions provided? Yes   Medications obtained and verified? Yes - he has all medications but does not have a feeding supplement.   Other? No   Any new allergies since your discharge? No   Do you have support at home? Yes   Home Care and Equipment/Supplies: Were home health services ordered? Yes - he said he was receiving home health services prior to this hospitalization and services will now be resumed.  If so, what is the name of the agency? [facility] Has the agency set up a time to come to the patient's home?  He has not heard from them since he has been home.   Were any new equipment or medical supplies ordered?  No What is the name of the medical supply agency?  n/a Were you able to get the supplies/equipment? not applicable Do you have any questions related to the use of the equipment or supplies? No   He already had a walker and bedside commode.   Functional Questionnaire: (I = Independent and D = Dependent) ADLs:independent, uses walker with ambulation. Left BKA  Follow up appointments reviewed:   PCP Hospital f/u appt confirmed? Yes - Dr Alvis Lemmings 10/20/2020.   Specialist Hospital f/u appt confirmed? none scheduled at this time.  Are transportation arrangements needed? Yes  - please refer to note above.   If their condition worsens, is the pt aware to call PCP or go to the Emergency Dept.? Yes  Was the patient provided with contact information for the PCP's office or ED? Yes  Was to pt encouraged to call back with questions or concerns? Yes

## 2020-10-18 ENCOUNTER — Telehealth: Payer: Self-pay | Admitting: Orthopedic Surgery

## 2020-10-18 NOTE — Progress Notes (Signed)
Attempted to call pt at [phone] that he left belongings but was not able to leave message.

## 2020-10-18 NOTE — Telephone Encounter (Signed)
Jessica(RN) from [facility] called with information. Patient fell with walker last Thursday. Connor [name] states patient has no injuries. No needed call back. Connor [name] phone number is [phone].

## 2020-10-19 ENCOUNTER — Telehealth: Payer: Self-pay

## 2020-10-19 NOTE — Telephone Encounter (Signed)
Connor [name] called to request an Oxycodone refill. Patient had been advised to call the last prescribing provider.    Per patient he is currently taking OTC Tylenol for pain.   Dr. Berline Chough been informed and agreed. Patient has an appointment here on 11/11/2020.

## 2020-10-19 NOTE — Telephone Encounter (Signed)
Connor [name] with [facility] wanted to let Dr. Lajoyce Corners know that patient had a fall last night and no injuries.  Please advise.  Thank you.

## 2020-10-19 NOTE — Telephone Encounter (Signed)
Noted pt is s/p AKA and has follow up 10/24/20

## 2020-10-20 ENCOUNTER — Ambulatory Visit: Payer: Medicaid Other | Admitting: Family Medicine

## 2020-10-20 NOTE — Telephone Encounter (Signed)
This is the second fall in just a matter of days. No injuries pt has an appt on Monday fo follow up.

## 2020-10-24 ENCOUNTER — Ambulatory Visit (INDEPENDENT_AMBULATORY_CARE_PROVIDER_SITE_OTHER): Payer: Medicaid Other | Admitting: Physician Assistant

## 2020-10-24 ENCOUNTER — Encounter: Payer: Self-pay | Admitting: Orthopedic Surgery

## 2020-10-24 DIAGNOSIS — Z89612 Acquired absence of left leg above knee: Secondary | ICD-10-CM

## 2020-10-24 MED ORDER — OXYCODONE HCL 5 MG PO TABS: 5.0000 mg | ORAL_TABLET | Freq: Four times a day (QID) | ORAL | 0 refills | Status: DC | PRN

## 2020-10-24 NOTE — Progress Notes (Signed)
Office Visit Note   Patient: Connor [name]           Date of Birth: [birth year]           MRN: [id number] Visit Date: 10/24/2020              Requested by: Hoy Register, MD [address] [place],  [place] [address] PCP: Hoy Register, MD  Chief Complaint  Patient presents with  . Left Leg - Routine Post Op    09/09/20 revision left AKA      HPI: Patient is a pleasant 65 year old gentleman who is 6 weeks status post left above-knee amputation revision.  He is doing a dry dressing change daily.  He does try to wear his shrinker at night.  He has some fasciculations with the amputation stump which he says only happens when it is painful.  He wears a shrinker at night but finds it difficult to tolerate during the day  Assessment & Plan: Visit Diagnoses: No diagnosis found.  Plan: Patient will be evaluated for prosthetic by [facility].  Prescription was provided to him today I did give him a refill on his pain medication follow-up in 2 weeks Patient is a new left transfemoral amputee.  Patient's current comorbidities are not expected to impact the ability to function with the prescribed prosthesis. Patient verbally communicates a strong desire to use a prosthesis. Patient currently requires mobility aids to ambulate without a prosthesis.  Expects not to use mobility aids with a new prosthesis.  Patient is a K2 level ambulator that will use a prosthesis to walk around their home and the community over low level environmental barriers.     Follow-Up Instructions: No follow-ups on file.   Ortho Exam  Patient is alert, oriented, no adenopathy, well-dressed, normal affect, normal respiratory effort. Left above-knee amputation stump is healed well approximated wound edges no swelling no cellulitis no erythema no signs of infection.  He does have some fasciculations which he says are only when he has pain  Imaging: No results found. No images are attached to the  encounter.  Labs: Lab Results  Component Value Date   HGBA1C 4.6 (L) 09/02/2020   REPTSTATUS 09/12/2020 FINAL 09/07/2020   GRAMSTAIN  09/07/2020    RARE WBC PRESENT, PREDOMINANTLY MONONUCLEAR NO ORGANISMS SEEN    CULT  09/07/2020    No growth aerobically or anaerobically. Performed at [facility], [address] [place], [place] [address]    Imelda Pillow ENTEROCOCCUS FAECALIS (A) 08/31/2020     Lab Results  Component Value Date   ALBUMIN 3.1 (L) 10/07/2020   ALBUMIN 3.3 (L) 10/06/2020   ALBUMIN 2.2 (L) 09/20/2020    Lab Results  Component Value Date   MG 1.7 10/06/2020   MG 1.8 09/14/2020   MG 1.7 09/13/2020   No results found for: VD25OH  No results found for: PREALBUMIN CBC EXTENDED Latest Ref Rng & Units 10/07/2020 10/06/2020 09/27/2020  WBC 4.0 - 10.5 K/uL 11.4(H) 10.4 6.9  RBC 4.22 - 5.81 MIL/uL 3.57(L) 3.40(L) 2.76(L)  HGB 13.0 - 17.0 g/dL 10.3(L) 9.9(L) 8.2(L)  HCT 39.0 - 52.0 % 33.6(L) 31.9(L) 26.3(L)  PLT 150 - 400 K/uL 316 304 310  NEUTROABS 1.7 - 7.7 K/uL - - 4.2  LYMPHSABS 0.7 - 4.0 K/uL - - 1.2     There is no height or weight on file to calculate BMI.  Orders:  No orders of the defined types were placed in this encounter.  Meds  ordered this encounter  Medications  . oxyCODONE (OXY IR/ROXICODONE) 5 MG immediate release tablet    Sig: Take 1 tablet (5 mg total) by mouth every 6 (six) hours as needed for severe pain.    Dispense:  10 tablet    Refill:  0     Procedures: No procedures performed  Clinical Data: No additional findings.  ROS:  All other systems negative, except as noted in the HPI. Review of Systems  Objective: Vital Signs: There were no vitals taken for this visit.  Specialty Comments:  No specialty comments available.  PMFS History: Patient Active Problem List   Diagnosis Date Noted  . Paroxysmal A-fib (HCC) 10/10/2020  . Gastritis 10/10/2020  . Dyspepsia 10/07/2020  . CKD stage G2/A2, GFR 60-89 and albumin  creatinine ratio 30-299 mg/g 10/07/2020  . Atypical chest pain 10/06/2020  . Acute blood loss anemia   . Essential hypertension   . Postoperative pain   . Severe anemia 09/19/2020  . History of left above knee amputation (HCC) 09/19/2020  . Left above-knee amputee (HCC) 09/19/2020  . 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 CKD (chronic kidney disease), stage III (HCC) 09/01/2020  . Morbid obesity (HCC) 09/01/2020  . Severe sepsis with acute organ dysfunction due to Streptococcus species (HCC) 08/30/2020  . Cellulitis of left leg 08/30/2020  . Lactic acidosis 08/30/2020  . Elevated troponin 08/30/2020  . Hypoalbuminemia 08/30/2020  . Leukocytosis 08/30/2020  . Thrombocytopenia (HCC) 08/30/2020  . Hypokalemia 08/30/2020  . Hyperglycemia 08/30/2020  . Transaminitis 08/30/2020  . Total bilirubin, elevated 08/30/2020  . AKI (acute kidney injury) (HCC) 08/30/2020  . Nausea vomiting and diarrhea 08/30/2020  . Hypertension 05/20/2020  . Persistent atrial fibrillation [facility])    Past Medical History:  Diagnosis Date  . AAA (abdominal aortic aneurysm) (HCC)   . History of open heart surgery   . Hypertension   . Seroma due to trauma [facility])     Family History  Problem Relation Age of Onset  . CAD Mother   . CVA Father     Past Surgical History:  Procedure Laterality Date  . ABDOMINAL AORTIC ANEURYSM REPAIR    . AMPUTATION Left 09/07/2020   Procedure: ATTEMPTED LEFT LEG DEBRIDEMENT FASCIOTOMIES, APPLY INSTILLATION WOUND VAC, ABOVE KNEE AMPUTATION;  Surgeon: Nadara Mustard, MD;  Location: [facility] OR;  Service: Orthopedics;  Laterality: Left;  . BUBBLE STUDY  09/05/2020   Procedure: BUBBLE STUDY;  Surgeon: Meriam Sprague, MD;  Location: [facility] ENDOSCOPY;  Service: Cardiovascular;;  . CARDIOVERSION N/A 02/11/2020   Procedure: CARDIOVERSION;  Surgeon: Little Ishikawa, MD;  Location: [facility] ENDOSCOPY;  Service: Cardiovascular;  Laterality: N/A;  . CORONARY ARTERY BYPASS  GRAFT    . STUMP REVISION Left 09/09/2020   Procedure: REVISION LEFT ABOVE KNEE AMPUTATION;  Surgeon: Nadara Mustard, MD;  Location: [facility] OR;  Service: Orthopedics;  Laterality: Left;  . TEE WITHOUT CARDIOVERSION N/A 09/05/2020   Procedure: TRANSESOPHAGEAL ECHOCARDIOGRAM (TEE);  Surgeon: Meriam Sprague, MD;  Location: [facility] ENDOSCOPY;  Service: Cardiovascular;  Laterality: N/A;   Social History   Occupational History  . Not on file  Tobacco Use  . Smoking status: Current Every Day Smoker    Packs/day: 0.50  . Smokeless tobacco: Never Used  Vaping Use  . Vaping Use: Never used  Substance and Sexual Activity  . Alcohol use: Never  . Drug use: Never  . Sexual activity: Not on file

## 2020-10-27 ENCOUNTER — Encounter: Payer: Self-pay | Admitting: Emergency Medicine

## 2020-10-27 ENCOUNTER — Other Ambulatory Visit: Payer: Self-pay

## 2020-10-27 ENCOUNTER — Ambulatory Visit
Admission: EM | Admit: 2020-10-27 | Discharge: 2020-10-27 | Disposition: A | Discharge status: Home / Self Care | Payer: Medicaid Other | Attending: Emergency Medicine | Admitting: Emergency Medicine

## 2020-10-27 DIAGNOSIS — Z1152 Encounter for screening for COVID-19: Secondary | ICD-10-CM | POA: Diagnosis not present

## 2020-10-27 DIAGNOSIS — R509 Fever, unspecified: Secondary | ICD-10-CM | POA: Diagnosis not present

## 2020-10-27 DIAGNOSIS — R110 Nausea: Secondary | ICD-10-CM

## 2020-10-27 MED ORDER — ONDANSETRON HCL 4 MG PO TABS: 4.0000 mg | ORAL_TABLET | Freq: Three times a day (TID) | ORAL | 0 refills | Status: DC | PRN

## 2020-10-27 MED ORDER — ACETAMINOPHEN 325 MG PO TABS
650.0000 mg | ORAL_TABLET | Freq: Once | ORAL | Status: AC
  Administered 2020-10-27: 650 mg via ORAL

## 2020-10-27 NOTE — ED Provider Notes (Signed)
[facility]   [id number] 10/27/20 Arrival Time: 1734   CC: COVID symptoms  SUBJECTIVE: History from: patient.  Micahel Omlor is a 65 y.o. male who who presented to the urgent care for complaint of fever for the past few days and nausea for the past month.  States he was sent to the urgent care by a home health nurse who told him he has a fever.  Temperature in office is 100.6 F.  Denies sick exposure to COVID, flu or strep.  Denies recent travel.  Has tried OTC medication without relief.  Denies alleviating or aggravating factors.  Denies previous symptoms in the past.   Denies fever, chills, fatigue, sinus pain, rhinorrhea, sore throat, SOB, wheezing, chest pain, nausea, changes in bowel or bladder habits.     ROS: As per HPI.  All other pertinent ROS negative.      Past Medical History:  Diagnosis Date  . AAA (abdominal aortic aneurysm) (HCC)   . History of open heart surgery   . Hypertension   . Seroma due to trauma [facility])    Past Surgical History:  Procedure Laterality Date  . ABDOMINAL AORTIC ANEURYSM REPAIR    . AMPUTATION Left 09/07/2020   Procedure: ATTEMPTED LEFT LEG DEBRIDEMENT FASCIOTOMIES, APPLY INSTILLATION WOUND VAC, ABOVE KNEE AMPUTATION;  Surgeon: Nadara Mustard, MD;  Location: [facility] OR;  Service: Orthopedics;  Laterality: Left;  . BUBBLE STUDY  09/05/2020   Procedure: BUBBLE STUDY;  Surgeon: Meriam Sprague, MD;  Location: [facility] ENDOSCOPY;  Service: Cardiovascular;;  . CARDIOVERSION N/A 02/11/2020   Procedure: CARDIOVERSION;  Surgeon: Little Ishikawa, MD;  Location: [facility] ENDOSCOPY;  Service: Cardiovascular;  Laterality: N/A;  . CORONARY ARTERY BYPASS GRAFT    . STUMP REVISION Left 09/09/2020   Procedure: REVISION LEFT ABOVE KNEE AMPUTATION;  Surgeon: Nadara Mustard, MD;  Location: [facility] OR;  Service: Orthopedics;  Laterality: Left;  . TEE WITHOUT CARDIOVERSION N/A 09/05/2020   Procedure: TRANSESOPHAGEAL ECHOCARDIOGRAM (TEE);  Surgeon: Meriam Sprague,  MD;  Location: [facility] ENDOSCOPY;  Service: Cardiovascular;  Laterality: N/A;   No Known Allergies No current facility-administered medications on file prior to encounter.   Current Outpatient Medications on File Prior to Encounter  Medication Sig Dispense Refill  . acetaminophen (TYLENOL) 325 MG tablet Take 2 tablets (650 mg total) by mouth every 6 (six) hours as needed for moderate pain.    Marland Kitchen diclofenac Sodium (VOLTAREN) 1 % GEL Apply 2 g topically 4 (four) times daily. 2 g 0  . diltiazem (CARDIZEM CD) 240 MG 24 hr capsule Take 1 capsule (240 mg total) by mouth daily. 30 capsule 0  . ELIQUIS 5 MG TABS tablet Take 1 tablet (5 mg total) by mouth 2 (two) times daily. 180 tablet 3  . feeding supplement (ENSURE ENLIVE / ENSURE PLUS) LIQD Take 237 mLs by mouth 2 (two) times daily between meals. 14220 mL 0  . furosemide (LASIX) 40 MG tablet Take once daily for swelling or shortness of breath. 10 tablet 0  . gabapentin (NEURONTIN) 400 MG capsule Take 1 capsule (400 mg total) by mouth 3 (three) times daily. 90 capsule 0  . hydrALAZINE (APRESOLINE) 25 MG tablet Take 1 tablet (25 mg total) by mouth every 8 (eight) hours. 90 tablet 0  . labetalol (NORMODYNE) 200 MG tablet Take 2 tablets (400 mg total) by mouth 2 (two) times daily. 360 tablet 3  . Multiple Vitamins-Minerals (MULTIVITAMIN WITH MINERALS) tablet Take 1 tablet by mouth daily. Men    .  oxyCODONE (OXY IR/ROXICODONE) 5 MG immediate release tablet Take 1 tablet (5 mg total) by mouth every 6 (six) hours as needed for severe pain. 10 tablet 0  . pantoprazole (PROTONIX) 40 MG tablet Take 1 tablet (40 mg total) by mouth daily for 14 days. 14 tablet 0  . polyethylene glycol (MIRALAX / GLYCOLAX) 17 g packet Take 17 g by mouth daily. 14 each 0   Social History   Socioeconomic History  . Marital status: Single    Spouse name: Not on file  . Number of children: Not on file  . Years of education: Not on file  . Highest education level: Not on file   Occupational History  . Not on file  Tobacco Use  . Smoking status: Current Every Day Smoker    Packs/day: 0.50  . Smokeless tobacco: Never Used  Vaping Use  . Vaping Use: Never used  Substance and Sexual Activity  . Alcohol use: Never  . Drug use: Never  . Sexual activity: Not on file  Other Topics Concern  . Not on file  Social History Narrative  . Not on file   Social Determinants of Health   Financial Resource Strain: Not on file  Food Insecurity: Not on file  Transportation Needs: Not on file  Physical Activity: Not on file  Stress: Not on file  Social Connections: Not on file  Intimate Partner Violence: Not on file   Family History  Problem Relation Age of Onset  . CAD Mother   . CVA Father     OBJECTIVE:  Vitals:   10/27/20 1747 10/27/20 1822  BP: (!) 147/81   Pulse: 75   Resp: 18   Temp: (!) 100.6 F (38.1 C) (!) 100.4 F (38 C)  TempSrc: Oral Oral  SpO2: 93%      General appearance: alert; appears fatigued, but nontoxic; speaking in full sentences and tolerating own secretions HEENT: NCAT; Ears: EACs clear, TMs pearly gray; Eyes: PERRL.  EOM grossly intact. Sinuses: nontender; Nose: nares patent without rhinorrhea, Throat: oropharynx clear, tonsils non erythematous or enlarged, uvula midline  Neck: supple without LAD Lungs: unlabored respirations, symmetrical air entry; cough: absent; no respiratory distress; CTAB Heart: regular rate and rhythm.  Radial pulses 2+ symmetrical bilaterally Skin: warm and dry Psychological: alert and cooperative; normal mood and affect  LABS:  No results found for this or any previous visit (from the past 24 hour(s)).   ASSESSMENT & PLAN:  1. Fever, unknown origin   2. Encounter for screening for COVID-19   3. Nausea without vomiting     Meds ordered this encounter  Medications  . ondansetron (ZOFRAN) 4 MG tablet    Sig: Take 1 tablet (4 mg total) by mouth every 8 (eight) hours as needed for nausea or  vomiting.    Dispense:  30 tablet    Refill:  0  . acetaminophen (TYLENOL) tablet 650 mg    Discharge instructions   COVID-19, flu A/B testing ordered.  It will take between 2-7 days for test results.  Someone will contact you regarding abnormal results.    Get plenty of rest and push fluids Zofran was prescribed for nausea Use OTC medication like Tylenol/ibuprofen as needed for fever or pain Tylenol every 4 hours or ibuprofen every 6 hours as needed Use medications daily for symptom relief Call or go to the ED if you have any new or worsening symptoms such as fever, worsening cough, shortness of breath, chest tightness, chest pain, turning  blue, changes in mental status, etc...   Reviewed expectations re: course of current medical issues. Questions answered. Outlined signs and symptoms indicating need for more acute intervention. Patient verbalized understanding. After Visit Summary given.         Durward Parcel, FNP 10/27/20 [phone]

## 2020-10-27 NOTE — ED Triage Notes (Signed)
Home health nurse told him to come to urgent care because he has a fever. Only other s/s he reports is nausea.  States he has been feeling nauseated for over a month now.  Pt had LT bka on jan 14.

## 2020-10-27 NOTE — Discharge Instructions (Addendum)
COVID-19, flu A/B testing ordered.  It will take between 2-7 days for test results.  Someone will contact you regarding abnormal results.    Get plenty of rest and push fluids Zofran was prescribed for nausea Use OTC medication like Tylenol/ibuprofen as needed for fever or pain Tylenol every 4 hours or ibuprofen every 6 hours as needed Use medications daily for symptom relief Call or go to the ED if you have any new or worsening symptoms such as fever, worsening cough, shortness of breath, chest tightness, chest pain, turning blue, changes in mental status, etc..Marland Kitchen

## 2020-10-29 LAB — COVID-19, FLU A+B NAA
Influenza A, NAA: NOT DETECTED
Influenza B, NAA: NOT DETECTED
SARS-CoV-2, NAA: DETECTED — AB

## 2020-11-07 ENCOUNTER — Ambulatory Visit: Payer: Medicaid Other | Admitting: Orthopedic Surgery

## 2020-11-07 ENCOUNTER — Ambulatory Visit (HOSPITAL_COMMUNITY): Payer: Medicaid Other | Admitting: Hematology

## 2020-11-11 ENCOUNTER — Encounter: Payer: Medicaid Other | Admitting: Physical Medicine and Rehabilitation

## 2020-11-15 ENCOUNTER — Telehealth: Payer: Self-pay

## 2020-11-15 NOTE — Telephone Encounter (Signed)
[facility] nurse called:   Connor [name] Sane blood pressure today was 175/110 in the right arm. NO other complaints. Patient has not had his first,  PCP appointment with Dr. Alvis Lemmings.   Advised nurse to have patient monitor his blood pressure. If it does not improve or worsening  symptoms see and Urgent Care this evening. Or go to the Hospital. because Dr. Berline Chough is not in the office. However I will inform her of his status.   Call back phone [phone].

## 2020-11-16 NOTE — Telephone Encounter (Signed)
[facility] called and given Dr. Dahlia Client reply.

## 2020-11-16 NOTE — Telephone Encounter (Signed)
I left a message on [facility]'s voicemail- to call if still an issue-I would wonder if pt is compliant with meds- since that wasn't the issue prior.  If so, then let me know- can try and titrate something- but also needs to discuss with new PCP as soon as has appointment- thanks, ML

## 2020-11-18 ENCOUNTER — Encounter
Payer: Medicaid Other | Attending: Physical Medicine and Rehabilitation | Admitting: Physical Medicine and Rehabilitation

## 2020-11-18 ENCOUNTER — Ambulatory Visit (HOSPITAL_COMMUNITY): Payer: Medicaid Other | Admitting: Hematology and Oncology

## 2020-11-22 ENCOUNTER — Ambulatory Visit: Payer: Self-pay | Admitting: *Deleted

## 2020-11-22 NOTE — Telephone Encounter (Signed)
Shanda Bumps, LPN with [facility] with pt on speaker phone called in.  His BP has been running high.   Today it's 160/90.   Last week it was 175/110.   On 11/03/2020 it was 198/96. He is c/o having shortness of breath.   He is dizzy and having chest pains especially in the mornings.   Any exertion makes him short of breath.  He is wheezing at night.   He is also having dizziness.  He has had heart surgery and AAA repair.  He was in the hospital on 10/05/2020 with chest pains.   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     He was in the hospital on 10/27/2020 for fever.  He is taking his BP medications daily as prescribed.    Shanda Bumps, LPN concerned that his BP remains elevated with his history.   He just moved here a few months ago and does not have a PCP.   He is to be seen at [facility] by Dr. Alvis Lemmings on 12/26/2020.  Per the protocol and with his history I have referred him to the ED.   Shanda Bumps, LPN and pt were agreeable with this plan.   He does not have transportation plus he has bilateral above the knee amputations.   I let him know he could call the ambulance to take him.   He suggested calling transportation services to take him to the hospital but I let him know going by ambulance would be better in his situation.  Shanda Bumps, LPN with [facility] can be reached at [phone].  Pt can be reached at [phone].  I have sent my notes to Dr. Alvis Lemmings at [facility] for her information even though he has not been seen there yet.  (The name Luciana Axe is a mistake.   Disregard it.   I'm not sure why he told me in the note below he did not have any heart or lung problems when I asked him so disregard that also).      Reason for Disposition . Difficult to awaken or acting confused (e.g., disoriented, slurred speech) . [1] Systolic BP  >= 160 OR Diastolic >= 100 AND [2] cardiac or neurologic symptoms (e.g., chest pain, difficulty  breathing, unsteady gait, blurred vision)  Answer Assessment - Initial Assessment Questions 1. BLOOD PRESSURE: "What is the blood pressure?" "Did you take at least two measurements 5 minutes apart?"     Nurse 160/90 for today. 175/110 last week.     198/96 on 11/10/2020 2. ONSET: "When did you take your blood pressure?"     A minutes ago by the nurse 3. HOW: "How did you obtain the blood pressure?" (e.g., visiting nurse, automatic home BP monitor)     [facility] nurse.   Bilateral AKA.   I get winded easy.   I'm wheezing at night. No underlying heart or lung problems 4. HISTORY: "Do you have a history of high blood pressure?"     Yes 5. MEDICATIONS: "Are you taking any medications for blood pressure?" "Have you missed any doses recently?"     Taking BP medication every day. 6. OTHER SYMPTOMS: "Do you have any symptoms?" (e.g., headache, chest pain, blurred vision, difficulty breathing, weakness)     Consondra the [facility] made me call. I'm having dizziness..   When I get up I am dizzy.  When I sit down it stops.   This has been  going on since I came home from the hospital.    I got out of the hospital 09/30/2020 from having chest pain.   I had to go back to the hospital after having my leg amputated due to vomiting.   Has A.Fib.   Had Aortic dissection and sternal infection.    I get chest pain in the mornings when I'm moving about.  I get short of breath easily when getting around in my wheelchair.    He moved here and don't have a PCP here.      Has not been seen by Dr. Alvis Lemmings.   He was having chest pain and went back to the hospital  7. PREGNANCY: "Is there any chance you are pregnant?" "When was your last menstrual period?"     N/A  Protocols used: BLOOD PRESSURE - HIGH-A-AH

## 2020-11-22 NOTE — Telephone Encounter (Signed)
Please refer patient to mobile unit or schedule him an appointment with any available PCP.

## 2020-12-07 ENCOUNTER — Encounter (HOSPITAL_COMMUNITY): Payer: Self-pay | Admitting: Emergency Medicine

## 2020-12-07 ENCOUNTER — Emergency Department (HOSPITAL_COMMUNITY): Payer: Medicaid Other

## 2020-12-07 ENCOUNTER — Inpatient Hospital Stay (HOSPITAL_COMMUNITY)
Admission: EM | Admit: 2020-12-07 | Discharge: 2020-12-09 | DRG: 291 | Disposition: A | Discharge status: Home Health | Payer: Medicaid Other | Attending: Internal Medicine | Admitting: Internal Medicine

## 2020-12-07 ENCOUNTER — Other Ambulatory Visit: Payer: Self-pay

## 2020-12-07 DIAGNOSIS — I5033 Acute on chronic diastolic (congestive) heart failure: Secondary | ICD-10-CM

## 2020-12-07 DIAGNOSIS — E669 Obesity, unspecified: Secondary | ICD-10-CM | POA: Diagnosis present

## 2020-12-07 DIAGNOSIS — Z9114 Patient's other noncompliance with medication regimen: Secondary | ICD-10-CM

## 2020-12-07 DIAGNOSIS — I2510 Atherosclerotic heart disease of native coronary artery without angina pectoris: Secondary | ICD-10-CM | POA: Diagnosis present

## 2020-12-07 DIAGNOSIS — N1831 Chronic kidney disease, stage 3a: Secondary | ICD-10-CM | POA: Diagnosis present

## 2020-12-07 DIAGNOSIS — Z955 Presence of coronary angioplasty implant and graft: Secondary | ICD-10-CM

## 2020-12-07 DIAGNOSIS — I480 Paroxysmal atrial fibrillation: Secondary | ICD-10-CM

## 2020-12-07 DIAGNOSIS — Z7982 Long term (current) use of aspirin: Secondary | ICD-10-CM

## 2020-12-07 DIAGNOSIS — I716 Thoracoabdominal aortic aneurysm, without rupture: Secondary | ICD-10-CM | POA: Diagnosis present

## 2020-12-07 DIAGNOSIS — Z951 Presence of aortocoronary bypass graft: Secondary | ICD-10-CM

## 2020-12-07 DIAGNOSIS — I130 Hypertensive heart and chronic kidney disease with heart failure and stage 1 through stage 4 chronic kidney disease, or unspecified chronic kidney disease: Principal | ICD-10-CM | POA: Diagnosis present

## 2020-12-07 DIAGNOSIS — Z823 Family history of stroke: Secondary | ICD-10-CM

## 2020-12-07 DIAGNOSIS — Z89612 Acquired absence of left leg above knee: Secondary | ICD-10-CM | POA: Diagnosis present

## 2020-12-07 DIAGNOSIS — N183 Chronic kidney disease, stage 3 unspecified: Secondary | ICD-10-CM | POA: Diagnosis not present

## 2020-12-07 DIAGNOSIS — I10 Essential (primary) hypertension: Secondary | ICD-10-CM | POA: Diagnosis not present

## 2020-12-07 DIAGNOSIS — F419 Anxiety disorder, unspecified: Secondary | ICD-10-CM | POA: Diagnosis present

## 2020-12-07 DIAGNOSIS — R0789 Other chest pain: Secondary | ICD-10-CM | POA: Diagnosis not present

## 2020-12-07 DIAGNOSIS — Z79899 Other long term (current) drug therapy: Secondary | ICD-10-CM

## 2020-12-07 DIAGNOSIS — I509 Heart failure, unspecified: Secondary | ICD-10-CM

## 2020-12-07 DIAGNOSIS — Z8249 Family history of ischemic heart disease and other diseases of the circulatory system: Secondary | ICD-10-CM

## 2020-12-07 DIAGNOSIS — E876 Hypokalemia: Secondary | ICD-10-CM | POA: Diagnosis present

## 2020-12-07 DIAGNOSIS — J810 Acute pulmonary edema: Secondary | ICD-10-CM

## 2020-12-07 DIAGNOSIS — Z6831 Body mass index (BMI) 31.0-31.9, adult: Secondary | ICD-10-CM

## 2020-12-07 DIAGNOSIS — Z7901 Long term (current) use of anticoagulants: Secondary | ICD-10-CM

## 2020-12-07 DIAGNOSIS — F17200 Nicotine dependence, unspecified, uncomplicated: Secondary | ICD-10-CM | POA: Diagnosis present

## 2020-12-07 DIAGNOSIS — Z8616 Personal history of COVID-19: Secondary | ICD-10-CM

## 2020-12-07 DIAGNOSIS — I160 Hypertensive urgency: Secondary | ICD-10-CM | POA: Diagnosis present

## 2020-12-07 DIAGNOSIS — I7100 Dissection of unspecified site of aorta: Secondary | ICD-10-CM | POA: Diagnosis present

## 2020-12-07 LAB — RAPID URINE DRUG SCREEN, HOSP PERFORMED
Amphetamines: NOT DETECTED
Barbiturates: NOT DETECTED
Benzodiazepines: NOT DETECTED
Cocaine: NOT DETECTED
Opiates: NOT DETECTED
Tetrahydrocannabinol: NOT DETECTED

## 2020-12-07 LAB — RESP PANEL BY RT-PCR (FLU A&B, COVID) ARPGX2
Influenza A by PCR: NEGATIVE
Influenza B by PCR: NEGATIVE
SARS Coronavirus 2 by RT PCR: POSITIVE — AB

## 2020-12-07 LAB — CBG MONITORING, ED: Glucose-Capillary: 97 mg/dL (ref 70–99)

## 2020-12-07 LAB — COMPREHENSIVE METABOLIC PANEL
ALT: 17 U/L (ref 0–44)
AST: 22 U/L (ref 15–41)
Albumin: 3.5 g/dL (ref 3.5–5.0)
Alkaline Phosphatase: 62 U/L (ref 38–126)
Anion gap: 10 (ref 5–15)
BUN: 16 mg/dL (ref 8–23)
CO2: 25 mmol/L (ref 22–32)
Calcium: 8.3 mg/dL — ABNORMAL LOW (ref 8.9–10.3)
Chloride: 106 mmol/L (ref 98–111)
Creatinine, Ser: 1.14 mg/dL (ref 0.61–1.24)
GFR, Estimated: >60 mL/min (ref >60)
Glucose, Bld: 112 mg/dL — ABNORMAL HIGH (ref 70–99)
Potassium: 3.1 mmol/L — ABNORMAL LOW (ref 3.5–5.1)
Sodium: 141 mmol/L (ref 135–145)
Total Bilirubin: 0.9 mg/dL (ref 0.3–1.2)
Total Protein: 7.1 g/dL (ref 6.5–8.1)

## 2020-12-07 LAB — APTT: aPTT: 39 seconds — ABNORMAL HIGH (ref 24–36)

## 2020-12-07 LAB — CBC
HCT: 30.3 % — ABNORMAL LOW (ref 39.0–52.0)
Hemoglobin: 9.2 g/dL — ABNORMAL LOW (ref 13.0–17.0)
MCH: 30 pg (ref 26.0–34.0)
MCHC: 30.4 g/dL (ref 30.0–36.0)
MCV: 98.7 fL (ref 80.0–100.0)
Platelets: 195 10*3/uL (ref 150–400)
RBC: 3.07 MIL/uL — ABNORMAL LOW (ref 4.22–5.81)
RDW: 17.4 % — ABNORMAL HIGH (ref 11.5–15.5)
WBC: 4.9 10*3/uL (ref 4.0–10.5)
nRBC: 0 % (ref 0.0–0.2)

## 2020-12-07 LAB — PROTIME-INR
INR: 1.3 — ABNORMAL HIGH (ref 0.8–1.2)
Prothrombin Time: 16.4 seconds — ABNORMAL HIGH (ref 11.4–15.2)

## 2020-12-07 LAB — TROPONIN I (HIGH SENSITIVITY)
Troponin I (High Sensitivity): 18 ng/L — ABNORMAL HIGH (ref <18)
Troponin I (High Sensitivity): 20 ng/L — ABNORMAL HIGH (ref <18)

## 2020-12-07 LAB — BRAIN NATRIURETIC PEPTIDE: B Natriuretic Peptide: 498 pg/mL — ABNORMAL HIGH (ref 0.0–100.0)

## 2020-12-07 MED ORDER — SODIUM CHLORIDE 0.9% FLUSH: 3.0000 mL | INTRAVENOUS | Status: DC | PRN

## 2020-12-07 MED ORDER — FUROSEMIDE 10 MG/ML IJ SOLN
40.0000 mg | INTRAMUSCULAR | Status: AC
  Administered 2020-12-07: 40 mg via INTRAVENOUS
  Filled 2020-12-07: qty 4

## 2020-12-07 MED ORDER — SODIUM CHLORIDE 0.9 % IV SOLN: 250.0000 mL | INTRAVENOUS | Status: DC | PRN

## 2020-12-07 MED ORDER — TRAZODONE HCL 50 MG PO TABS
100.0000 mg | ORAL_TABLET | Freq: Every day | ORAL | Status: DC
  Administered 2020-12-07 – 2020-12-08 (×2): 100 mg via ORAL
  Filled 2020-12-07 (×2): qty 2

## 2020-12-07 MED ORDER — LISINOPRIL 40 MG PO TABS: 200.0000 mg | ORAL_TABLET | Freq: Every day | ORAL | Status: DC

## 2020-12-07 MED ORDER — LISINOPRIL 10 MG PO TABS
20.0000 mg | ORAL_TABLET | Freq: Every day | ORAL | Status: DC
  Administered 2020-12-08 – 2020-12-09 (×2): 20 mg via ORAL
  Filled 2020-12-07 (×2): qty 2

## 2020-12-07 MED ORDER — OXYCODONE HCL 5 MG PO TABS
5.0000 mg | ORAL_TABLET | Freq: Four times a day (QID) | ORAL | Status: DC | PRN
  Administered 2020-12-08 – 2020-12-09 (×3): 5 mg via ORAL
  Filled 2020-12-07 (×3): qty 1

## 2020-12-07 MED ORDER — APIXABAN 5 MG PO TABS
5.0000 mg | ORAL_TABLET | Freq: Two times a day (BID) | ORAL | Status: DC
  Administered 2020-12-07 – 2020-12-09 (×4): 5 mg via ORAL
  Filled 2020-12-07 (×4): qty 1

## 2020-12-07 MED ORDER — ACETAMINOPHEN 325 MG PO TABS: 650.0000 mg | ORAL_TABLET | Freq: Four times a day (QID) | ORAL | Status: DC | PRN

## 2020-12-07 MED ORDER — LABETALOL HCL 200 MG PO TABS
200.0000 mg | ORAL_TABLET | Freq: Two times a day (BID) | ORAL | Status: DC
  Administered 2020-12-07 – 2020-12-09 (×4): 200 mg via ORAL
  Filled 2020-12-07 (×4): qty 1

## 2020-12-07 MED ORDER — ONDANSETRON HCL 4 MG/2ML IJ SOLN: 4.0000 mg | Freq: Four times a day (QID) | INTRAMUSCULAR | Status: DC | PRN

## 2020-12-07 MED ORDER — HYDRALAZINE HCL 25 MG PO TABS
25.0000 mg | ORAL_TABLET | Freq: Three times a day (TID) | ORAL | Status: DC
  Administered 2020-12-07 – 2020-12-09 (×6): 25 mg via ORAL
  Filled 2020-12-07 (×6): qty 1

## 2020-12-07 MED ORDER — ASPIRIN EC 81 MG PO TBEC
81.0000 mg | DELAYED_RELEASE_TABLET | Freq: Every day | ORAL | Status: DC
  Administered 2020-12-08 – 2020-12-09 (×2): 81 mg via ORAL
  Filled 2020-12-07 (×2): qty 1

## 2020-12-07 MED ORDER — PANTOPRAZOLE SODIUM 40 MG PO TBEC
40.0000 mg | DELAYED_RELEASE_TABLET | Freq: Every day | ORAL | Status: DC
  Administered 2020-12-08 – 2020-12-09 (×2): 40 mg via ORAL
  Filled 2020-12-07 (×2): qty 1

## 2020-12-07 MED ORDER — SODIUM CHLORIDE 0.9% FLUSH
3.0000 mL | Freq: Two times a day (BID) | INTRAVENOUS | Status: DC
  Administered 2020-12-08 – 2020-12-09 (×3): 3 mL via INTRAVENOUS

## 2020-12-07 MED ORDER — DILTIAZEM HCL ER COATED BEADS 240 MG PO CP24
240.0000 mg | ORAL_CAPSULE | Freq: Every day | ORAL | Status: DC
  Administered 2020-12-08 – 2020-12-09 (×2): 240 mg via ORAL
  Filled 2020-12-07 (×3): qty 1

## 2020-12-07 MED ORDER — HYDRALAZINE HCL 20 MG/ML IJ SOLN
10.0000 mg | INTRAMUSCULAR | Status: AC
  Administered 2020-12-07: 10 mg via INTRAVENOUS
  Filled 2020-12-07: qty 1

## 2020-12-07 MED ORDER — FUROSEMIDE 10 MG/ML IJ SOLN
40.0000 mg | Freq: Two times a day (BID) | INTRAMUSCULAR | Status: AC
  Administered 2020-12-08 – 2020-12-09 (×3): 40 mg via INTRAVENOUS
  Filled 2020-12-07 (×3): qty 4

## 2020-12-07 MED ORDER — SPIRONOLACTONE 25 MG PO TABS
25.0000 mg | ORAL_TABLET | Freq: Every day | ORAL | Status: DC
  Administered 2020-12-08 – 2020-12-09 (×2): 25 mg via ORAL
  Filled 2020-12-07 (×2): qty 1

## 2020-12-07 MED ORDER — HYDRALAZINE HCL 20 MG/ML IJ SOLN
10.0000 mg | Freq: Four times a day (QID) | INTRAMUSCULAR | Status: DC | PRN
  Administered 2020-12-07: 10 mg via INTRAVENOUS
  Filled 2020-12-07: qty 1

## 2020-12-07 MED ORDER — HYDRALAZINE HCL 25 MG PO TABS: 25.0000 mg | ORAL_TABLET | Freq: Three times a day (TID) | ORAL | Status: DC

## 2020-12-07 MED ORDER — FUROSEMIDE 10 MG/ML IJ SOLN
40.0000 mg | Freq: Once | INTRAMUSCULAR | Status: AC
  Administered 2020-12-07: 40 mg via INTRAVENOUS
  Filled 2020-12-07: qty 4

## 2020-12-07 MED ORDER — ACETAMINOPHEN 325 MG PO TABS: 650.0000 mg | ORAL_TABLET | ORAL | Status: DC | PRN

## 2020-12-07 MED ORDER — POTASSIUM CHLORIDE CRYS ER 20 MEQ PO TBCR
40.0000 meq | EXTENDED_RELEASE_TABLET | ORAL | Status: AC
  Administered 2020-12-07 – 2020-12-08 (×2): 40 meq via ORAL
  Filled 2020-12-07: qty 2

## 2020-12-07 NOTE — Telephone Encounter (Signed)
Called patient and LVM advising I was calling from [facility] in regards to scheduling an appointment. If patient calls back please advise patient nothing sooner available here for high BP but can be seen walk in on mobile medicine unit Mon-Thurs from 9-7.   Today mobile will be located at [employer] [phone] for walk in 9-12:15 and 2-6.  Tomorrow mobile will be located [address] [phone] for walk in 9-12:15 and 2-6.

## 2020-12-07 NOTE — ED Triage Notes (Signed)
BP has been running high since being discharged from the hospital recently.  States they changed his medications in the hospital.

## 2020-12-07 NOTE — ED Notes (Signed)
CRITICAL RESULT: Glynda Jaeger, RN made aware.

## 2020-12-07 NOTE — ED Notes (Signed)
Pt was covid + 3/3

## 2020-12-07 NOTE — H&P (Signed)
TRH H&P   Patient Demographics:    Connor [name], is a 65 y.o. male  MRN: [id number]   DOB - 1956-05-11  Admit Date - 12/07/2020  Outpatient Primary MD for the patient is Hoy RegisterNewlin, Enobong, MD  Referring MD/NP/PA: Dr Hyacinth MeekerMiller  Patient coming from: Home  No chief complaint on file.     HPI:    Connor SequinRobert [name]  is a 65 y.o. male,  past medical history for aortic dissection, status post repair, atrial fibrillation, coronary artery disease status post bypass grafting, chronic kidney disease, hypertension and obesity.,  Status post left AKA for necrotizing fasciitis this January. -Patient presents to ED for uncontrolled blood pressure, reports his blood pressure at home elevated as high as 230/190, report he did not take his blood pressure this morning because of the anxiety, as well reports he has been out of his hydralazine for last 3 weeks, unable to obtain refill, does report dyspnea, reports he has been sleeping in upper and upright position on the wheelchair for last couple nights, as well reports worsening lower extremity edema in the right leg, does report chest pain, report it is chronic in nature. -In ED blood pressure is elevated 195/114, elevated BNP 498, troponins 18> 20, chest x-ray significant for pulmonary edema, reports symptoms improved after IV Lasix, triage hospitalist consulted to admit.    Review of systems:    In addition to the HPI above,  No Fever-chills, No Headache, No changes with Vision or hearing, No problems swallowing food or Liquids, reports chest pain, which is chronic., Cough ,reports SOB No Abdominal pain, No Nausea or Vommitting, Bowel movements are regular, No Blood in stool or Urine, No dysuria, No new skin rashes or bruises, No new joints pains-aches,  No new weakness, tingling, numbness in any extremity, No recent weight gain or loss, No  polyuria, polydypsia or polyphagia, No significant Mental Stressors.  A full 10 point Review of Systems was done, except as stated above, all other Review of Systems were negative.   With Past History of the following :    Past Medical History:  Diagnosis Date  . AAA (abdominal aortic aneurysm) (HCC)   . History of open heart surgery   . Hypertension   . Seroma due to trauma [facility](HCC)       Past Surgical History:  Procedure Laterality Date  . ABDOMINAL AORTIC ANEURYSM REPAIR    . AMPUTATION Left 09/07/2020   Procedure: ATTEMPTED LEFT LEG DEBRIDEMENT FASCIOTOMIES, APPLY INSTILLATION WOUND VAC, ABOVE KNEE AMPUTATION;  Surgeon: Nadara Mustarduda, Marcus V, MD;  Location: [facility] OR;  Service: Orthopedics;  Laterality: Left;  . BUBBLE STUDY  09/05/2020   Procedure: BUBBLE STUDY;  Surgeon: Meriam SpraguePemberton, Heather E, MD;  [facility];  Service: Cardiovascular;;  . CARDIOVERSION N/A 02/11/2020   Procedure: CARDIOVERSION;  Surgeon: Little IshikawaSchumann, Christopher L, MD;  [facility];  Service: Cardiovascular;  Laterality: N/A;  .  CORONARY ARTERY BYPASS GRAFT    . STUMP REVISION Left 09/09/2020   Procedure: REVISION LEFT ABOVE KNEE AMPUTATION;  Surgeon: Nadara Mustard, MD;  Location: [facility] OR;  Service: Orthopedics;  Laterality: Left;  . TEE WITHOUT CARDIOVERSION N/A 09/05/2020   Procedure: TRANSESOPHAGEAL ECHOCARDIOGRAM (TEE);  Surgeon: Meriam Sprague, MD;  Location: [facility] ENDOSCOPY;  Service: Cardiovascular;  Laterality: N/A;      Social History:     Social History   Tobacco Use  . Smoking status: Current Every Day Smoker    Packs/day: 0.50  . Smokeless tobacco: Never Used  Substance Use Topics  . Alcohol use: Never      Family History :     Family History  Problem Relation Age of Onset  . CAD Mother   . CVA Father      Home Medications:   Prior to Admission medications   Medication Sig Start Date End Date Taking? Authorizing Provider  acetaminophen (TYLENOL) 325 MG tablet Take 2  tablets (650 mg total) by mouth every 6 (six) hours as needed for moderate pain. 10/10/20  Yes Arrien, York Ram, MD  albuterol (VENTOLIN HFA) 108 (90 Base) MCG/ACT inhaler Inhale 2 puffs into the lungs every 6 (six) hours as needed for wheezing or shortness of breath.   Yes [provider]  aspirin EC 81 MG tablet Take 81 mg by mouth daily. Swallow whole.   Yes [provider]  diclofenac Sodium (VOLTAREN) 1 % GEL APPLY 2 G TOPICALLY FOUR TIMES DAILY. 09/29/20 09/29/21 Yes Angiulli, Mcarthur Rossetti, PA-C  ELIQUIS 5 MG TABS tablet TAKE 1 TABLET (5 MG TOTAL) BY MOUTH 2 (TWO) TIMES DAILY. 09/29/20 09/29/21 Yes Angiulli, Mcarthur Rossetti, PA-C  gabapentin (NEURONTIN) 400 MG capsule TAKE 1 CAPSULE (400 MG TOTAL) BY MOUTH THREE TIMES DAILY. 09/29/20 09/29/21 Yes Angiulli, Mcarthur Rossetti, PA-C  hydrALAZINE (APRESOLINE) 25 MG tablet TAKE 1 TABLET (25 MG TOTAL) BY MOUTH EVERY EIGHT HOURS. 09/29/20 09/29/21 Yes Angiulli, Mcarthur Rossetti, PA-C  ibuprofen (ADVIL) 200 MG tablet Take 200 mg by mouth every 6 (six) hours as needed.   Yes [provider]  labetalol (NORMODYNE) 200 MG tablet TAKE 2 TABLETS (400 MG TOTAL) BY MOUTH TWO TIMES DAILY. 09/29/20 09/29/21 Yes Angiulli, Mcarthur Rossetti, PA-C  lisinopril (ZESTRIL) 10 MG tablet Take 20 tablets by mouth daily. 11/28/20  Yes [provider]  Multiple Vitamins-Minerals (MULTIVITAMIN WITH MINERALS) tablet Take 1 tablet by mouth daily. Men   Yes [provider]  oxyCODONE (OXY IR/ROXICODONE) 5 MG immediate release tablet Take 1 tablet (5 mg total) by mouth every 6 (six) hours as needed for severe pain. 10/24/20  Yes Persons, [place], [place]  spironolactone (ALDACTONE) 25 MG tablet Take 25 mg by mouth daily.   Yes [provider]  traZODone (DESYREL) 100 MG tablet Take 100 mg by mouth at bedtime. 11/28/20  Yes [provider]  diltiazem (CARDIZEM CD) 240 MG 24 hr capsule TAKE 1 CAPSULE (240 MG TOTAL) BY MOUTH DAILY. Patient not taking: Reported on 12/07/2020  09/29/20 09/29/21  Angiulli, Mcarthur Rossetti, PA-C  furosemide (LASIX) 40 MG tablet Take once daily for swelling or shortness of breath. Patient not taking: Reported on 12/07/2020 10/10/20   Arrien, York Ram, MD  ondansetron (ZOFRAN) 4 MG tablet Take 1 tablet (4 mg total) by mouth every 8 (eight) hours as needed for nausea or vomiting. Patient not taking: No sig reported 10/27/20   Durward Parcel, FNP  pantoprazole (PROTONIX) 40 MG tablet Take  1 tablet (40 mg total) by mouth daily for 14 days. 10/10/20 10/24/20  Arrien, York Ram, MD  polyethylene glycol (MIRALAX / GLYCOLAX) 17 g packet Take 17 g by mouth daily. Patient not taking: Reported on 12/07/2020 09/20/20   Calvert Cantor, MD     Allergies:    No Known Allergies   Physical Exam:   Vitals  Blood pressure (!) 157/87, pulse 83, temperature 98.3 F (36.8 C), temperature source Oral, resp. rate 15, SpO2 96 %.   1. General well developed male, laying in bed, no apparent distress  2. Normal affect and insight, Not Suicidal or Homicidal, Awake Alert, Oriented X 3.  3. No F.N deficits, ALL C.Nerves Intact, Strength 5/5 all 4 extremities, Sensation intact all 4 extremities, Plantars down going.  4. Ears and Eyes appear Normal, Conjunctivae clear, PERRLA. Moist Oral Mucosa.  5. Supple Neck, No JVD, No cervical lymphadenopathy appriciated, No Carotid Bruits.  6. Symmetrical Chest wall movement, Good air movement bilaterally, crackles bilaterally  7. RRR, No Gallops, Rubs or Murmurs, No Parasternal Heave.  +3 edema in the right lower extremity  8. Positive Bowel Sounds, Abdomen Soft, No tenderness, No organomegaly appriciated,No rebound -guarding or rigidity.  9.  No Cyanosis, Normal Skin Turgor, No Skin Rash or Bruise.  10. Good muscle tone,  joints appear normal , no effusions, Normal ROM.  Left AKA  11. No Palpable Lymph Nodes in Neck or Axillae   Data Review:    CBC Recent Labs  Lab 12/07/20 1545  WBC 4.9  HGB 9.2*   HCT 30.3*  PLT 195  MCV 98.7  MCH 30.0  MCHC 30.4  RDW 17.4*   ------------------------------------------------------------------------------------------------------------------  Chemistries  Recent Labs  Lab 12/07/20 1545  NA 141  K 3.1*  CL 106  CO2 25  GLUCOSE 112*  BUN 16  CREATININE 1.14  CALCIUM 8.3*  AST 22  ALT 17  ALKPHOS 62  BILITOT 0.9   ------------------------------------------------------------------------------------------------------------------ CrCl cannot be calculated (Unknown ideal weight.). ------------------------------------------------------------------------------------------------------------------ No results for input(s): TSH, T4TOTAL, T3FREE, THYROIDAB in the last 72 hours.  Invalid input(s): FREET3  Coagulation profile Recent Labs  Lab 12/07/20 1545  INR 1.3*   ------------------------------------------------------------------------------------------------------------------- No results for input(s): DDIMER in the last 72 hours. -------------------------------------------------------------------------------------------------------------------  Cardiac Enzymes No results for input(s): CKMB, TROPONINI, MYOGLOBIN in the last 168 hours.  Invalid input(s): CK ------------------------------------------------------------------------------------------------------------------    Component Value Date/Time   BNP 498.0 (H) 12/07/2020 1545     ---------------------------------------------------------------------------------------------------------------  Urinalysis    Component Value Date/Time   COLORURINE AMBER (A) 08/31/2020 0130   APPEARANCEUR CLOUDY (A) 08/31/2020 0130   LABSPEC 1.017 08/31/2020 0130   PHURINE 5.0 08/31/2020 0130   GLUCOSEU NEGATIVE 08/31/2020 0130   HGBUR LARGE (A) 08/31/2020 0130   BILIRUBINUR NEGATIVE 08/31/2020 0130   KETONESUR NEGATIVE 08/31/2020 0130   PROTEINUR 100 (A) 08/31/2020 0130   NITRITE NEGATIVE  08/31/2020 0130   LEUKOCYTESUR NEGATIVE 08/31/2020 0130    ----------------------------------------------------------------------------------------------------------------   Imaging Results:    DG Chest Portable 1 View  Result Date: 12/07/2020 CLINICAL DATA:  Shortness of breath. EXAM: PORTABLE CHEST 1 VIEW COMPARISON:  Chest x-ray 10/06/2020 FINDINGS: Redemonstration of an enlarged cardiac silhouette. Redemonstration of an enlarged aortic arch. The heart size and mediastinal contours are unchanged with redemonstration of aortic arch/proximal descending thoracic aorta stent graft. Sternotomy wires appear intact. No focal consolidation. Increased interstitial markings. No pleural effusion. No pneumothorax. No acute osseous abnormality. IMPRESSION: 1. Pulmonary edema with no definite pleural effusion. 2.  Status post aortic stent graft with redemonstration of an enlarged aortic arch. Electronically Signed   By: Tish Frederickson M.D.   On: 12/07/2020 16:09    My personal review of EKG: Rhythm NSR, Rate  72 /min, QTc 472 Sinus or ectopic atrial rhythm Incomplete RBBB and LAFB Anteroseptal infarct, age indeterminate  Assessment & Plan:    Active Problems:   CKD (chronic kidney disease), stage III (HCC)   Left above-knee amputee [facility])   Essential hypertension   Atypical chest pain   Paroxysmal A-fib (HCC)   Acute on chronic diastolic CHF (congestive heart failure) (HCC)   Acute on chronic diastolic CHF -Patient presents with significant evidence from volume overload, weighted BNP, pulmonary edema on chest x-ray, and worsening edema. -Is most likely due to elevated blood pressure contributing to volume overload. -He is admitted under CHF pathway, continue with daily weights, strict ins and outs, and IV Lasix. -Most recent echo January of this year showing preserved EF and grade 2 diastolic dysfunction, no need to repeat.  Hypertensive urgency -Pressure is elevated 195/114 on admission -Is  most likely due to him running out of his hydralazine. -He is resumed on his home regimen including hydralazine, labetalol, lisinopril and Cardizem, as well he is started on as needed IV hydralazine.  Paroxysmal A. Fib -Continue with apixaban and diltiazem  COVID-19 positive -Sent was recently positive for 3/3, he is between 21-90 days, no indication for isolation  hypokalemia -Repleted, especially he is on IV diuresis  Chest pain -of chronic nature report has been going on for few months, troponins non-ACS pattern 18> 20.  history for aortic dissection, status post repair  DVT Prophylaxis on Apixaban  AM Labs Ordered, also please review Full Orders  Family Communication: Admission, patients condition and plan of care including tests being ordered have been discussed with the patient  who indicate understanding and agree with the plan and Code Status.  Code Status Full  Likely DC to  home  Condition GUARDED   Consults called: none    Admission status: observation    Time spent in minutes : 60 minutes   Huey Bienenstock M.D on 12/07/2020 at 8:21 PM   [facility] - Office  [phone]

## 2020-12-07 NOTE — ED Provider Notes (Signed)
[facility]ENCY [facility]PARTMENT Provider Note   CSN: [id number] Arrival date & time: 12/07/20  1510     History No chief complaint on file.   Connor SequinRobert [name] is a 65 y.o. male.  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  He currently lives with his cousin.  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  Unchanged from prior studies.  The patient is chronically treated for hypertension, unfortunately he has had very poorly controlled hypertension despite continuing to take his lisinopril and labetalol.  He recently ran out of hydralazine and was told that he would not be able to get a refill of this medication until he ran out of his other refills as well.  It is unclear exactly who told him this but this is what he reports to me.  The patient states that over the last week he has had some elevated blood pressures last night measuring as high as 230/190, he states that he did not want to take his blood pressure this morning because of the anxiety and induces.  He reports the last few nights he has had to sleep in an upright position in his wheelchair because he gets too short of breath when he lays down.  He notes increasing swelling in his lower extremities especially on the right, he is on spironolactone, he has not been prescribed any other diuretics.  The patient does have chest pain which he reports is chronic ever since having  his last thoracic surgery which involved his right upper chest.  The pain that he has continued today is only with exertion only when he tries to move and always in the same place shooting from the right chest to the left chest.  He does not have the pain at rest.  He has not been coughing or having any fevers.  Past Medical History:  Diagnosis Date  . AAA (abdominal aortic aneurysm) (HCC)   . History of open heart surgery   . Hypertension   . Seroma due to trauma [facility](HCC)     Patient Active Problem List   Diagnosis Date Noted  . Paroxysmal A-fib (HCC) 10/10/2020  . Gastritis 10/10/2020  . Dyspepsia 10/07/2020  . CKD stage G2/A2, GFR 60-89 and albumin creatinine ratio 30-299 mg/g 10/07/2020  . Atypical chest pain 10/06/2020  . Acute blood loss anemia   . Essential hypertension   . Postoperative pain   . Severe anemia 09/19/2020  . History of left above knee amputation (HCC) 09/19/2020  . Left above-knee amputee (HCC) 09/19/2020  . 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 CKD (chronic kidney disease), stage III (HCC) 09/01/2020  . Morbid obesity (HCC) 09/01/2020  . Severe sepsis with acute organ dysfunction due to Streptococcus species (HCC) 08/30/2020  . Cellulitis of left leg 08/30/2020  . Lactic acidosis 08/30/2020  . Elevated troponin 08/30/2020  . Hypoalbuminemia 08/30/2020  . Leukocytosis 08/30/2020  .  Thrombocytopenia (HCC) 08/30/2020  . Hypokalemia 08/30/2020  . Hyperglycemia 08/30/2020  . Transaminitis 08/30/2020  . Total bilirubin, elevated 08/30/2020  . AKI (acute kidney injury) (HCC) 08/30/2020  . Nausea vomiting and diarrhea 08/30/2020  . Hypertension 05/20/2020  . Persistent atrial fibrillation [facility])     Past Surgical History:  Procedure Laterality Date  . ABDOMINAL AORTIC ANEURYSM REPAIR    . AMPUTATION Left 09/07/2020   Procedure: ATTEMPTED LEFT LEG DEBRIDEMENT FASCIOTOMIES, APPLY INSTILLATION WOUND VAC, ABOVE KNEE AMPUTATION;  Surgeon:  Nadara Mustard, MD;  Location: [facility] OR;  Service: Orthopedics;  Laterality: Left;  . BUBBLE STUDY  09/05/2020   Procedure: BUBBLE STUDY;  Surgeon: Meriam Sprague, MD;  Location: [facility] ENDOSCOPY;  Service: Cardiovascular;;  . CARDIOVERSION N/A 02/11/2020   Procedure: CARDIOVERSION;  Surgeon: Little Ishikawa, MD;  Location: [facility] ENDOSCOPY;  Service: Cardiovascular;  Laterality: N/A;  . CORONARY ARTERY BYPASS GRAFT    . STUMP REVISION Left 09/09/2020   Procedure: REVISION LEFT ABOVE KNEE AMPUTATION;  Surgeon: Nadara Mustard, MD;  Location: [facility] OR;  Service: Orthopedics;  Laterality: Left;  . TEE WITHOUT CARDIOVERSION N/A 09/05/2020   Procedure: TRANSESOPHAGEAL ECHOCARDIOGRAM (TEE);  Surgeon: Meriam Sprague, MD;  Location: [facility] ENDOSCOPY;  Service: Cardiovascular;  Laterality: N/A;       Family History  Problem Relation Age of Onset  . CAD Mother   . CVA Father     Social History   Tobacco Use  . Smoking status: Current Every Day Smoker    Packs/day: 0.50  . Smokeless tobacco: Never Used  Vaping Use  . Vaping Use: Never used  Substance Use Topics  . Alcohol use: Never  . Drug use: Never    Home Medications Prior to Admission medications   Medication Sig Start Date End Date Taking? Authorizing Provider  acetaminophen (TYLENOL) 325 MG tablet Take 2 tablets (650 mg total) by mouth every 6 (six) hours as needed for moderate pain. 10/10/20  Yes Arrien, York Ram, MD  albuterol (VENTOLIN HFA) 108 (90 Base) MCG/ACT inhaler Inhale 2 puffs into the lungs every 6 (six) hours as needed for wheezing or shortness of breath.   Yes [provider]  aspirin EC 81 MG tablet Take 81 mg by mouth daily. Swallow whole.   Yes [provider]  diclofenac Sodium (VOLTAREN) 1 % GEL APPLY 2 G TOPICALLY FOUR TIMES DAILY. 09/29/20 09/29/21 Yes Angiulli, Mcarthur Rossetti, PA-C  ELIQUIS 5 MG TABS tablet TAKE 1 TABLET (5 MG TOTAL) BY MOUTH 2 (TWO) TIMES DAILY. 09/29/20 09/29/21 Yes Angiulli, Mcarthur Rossetti, PA-C  gabapentin (NEURONTIN) 400 MG capsule TAKE 1 CAPSULE (400 MG TOTAL) BY MOUTH THREE TIMES DAILY. 09/29/20 09/29/21 Yes Angiulli, Mcarthur Rossetti, PA-C  hydrALAZINE (APRESOLINE) 25 MG tablet TAKE 1 TABLET (25 MG TOTAL) BY MOUTH EVERY EIGHT HOURS. 09/29/20 09/29/21 Yes Angiulli, Mcarthur Rossetti, PA-C  ibuprofen (ADVIL) 200 MG tablet Take 200 mg by mouth every 6 (six) hours as needed.   Yes [provider]  labetalol (NORMODYNE) 200 MG tablet TAKE 2 TABLETS (400 MG TOTAL) BY MOUTH TWO TIMES DAILY. 09/29/20 09/29/21 Yes Angiulli, Mcarthur Rossetti, PA-C  lisinopril (ZESTRIL) 10 MG tablet Take 20 tablets by mouth daily. 11/28/20  Yes [provider]  Multiple Vitamins-Minerals (MULTIVITAMIN WITH MINERALS) tablet Take 1 tablet by mouth daily. Men   Yes [provider]  oxyCODONE (OXY IR/ROXICODONE) 5 MG immediate release tablet Take 1 tablet (5 mg total) by mouth every 6 (six) hours as needed for  severe pain. 10/24/20  Yes Persons, [place], [place]  spironolactone (ALDACTONE) 25 MG tablet Take 25 mg by mouth daily.   Yes [provider]  traZODone (DESYREL) 100 MG tablet Take 100 mg by mouth at bedtime. 11/28/20  Yes [provider]  diltiazem (CARDIZEM CD) 240 MG 24 hr capsule TAKE 1 CAPSULE (240 MG TOTAL) BY MOUTH DAILY. Patient not taking: Reported on 12/07/2020 09/29/20 09/29/21  Angiulli, Mcarthur Rossetti, PA-C  furosemide (LASIX) 40 MG tablet Take once daily for swelling or shortness of breath. Patient not taking: Reported on 12/07/2020 10/10/20   Arrien, York Ram, MD  ondansetron (ZOFRAN) 4 MG tablet Take 1 tablet (4 mg total) by mouth every 8 (eight) hours as needed for nausea or vomiting. Patient not taking: No sig reported 10/27/20   Durward Parcel, FNP  pantoprazole (PROTONIX) 40 MG tablet Take 1 tablet (40 mg total) by mouth daily for 14 days. 10/10/20 10/24/20  Arrien, York Ram, MD  polyethylene glycol (MIRALAX / GLYCOLAX) 17 g packet Take 17 g by mouth daily. Patient not taking:  Reported on 12/07/2020 09/20/20   Calvert Cantor, MD    Allergies    Patient has no known allergies.  Review of Systems   Review of Systems  All other systems reviewed and are negative.   Physical Exam Updated Vital Signs BP (!) 177/100   Pulse 70   Temp 98.3 F (36.8 C) (Oral)   Resp 19   SpO2 96%   Physical Exam Vitals and nursing note reviewed.  Constitutional:      General: He is not in acute distress.    Appearance: He is well-developed.     Comments: Tachypneic and speaks in shortened sentences  HENT:     Head: Normocephalic and atraumatic.     Mouth/Throat:     Mouth: Mucous membranes are moist.     Pharynx: No oropharyngeal exudate.  Eyes:     General: No scleral icterus.       Right eye: No discharge.        Left eye: No discharge.     Conjunctiva/sclera: Conjunctivae normal.     Pupils: Pupils are equal, round, and reactive to light.  Neck:     Thyroid: No thyromegaly.     Vascular: No JVD.     Comments: No visible JVD Cardiovascular:     Rate and Rhythm: Normal rate and regular rhythm.     Heart sounds: Normal heart sounds. No murmur heard. No friction rub. No gallop.   Pulmonary:     Effort: No respiratory distress.     Breath sounds: Normal breath sounds. No wheezing or rales.     Comments: Tachypneic especially with any exertion, speaks in shortened sentences, clear lungs Abdominal:     General: Bowel sounds are normal. There is no distension.     Palpations: Abdomen is soft. There is no mass.     Tenderness: There is no abdominal tenderness.  Musculoskeletal:        General: No tenderness. Normal range of motion.     Cervical back: Normal range of motion and neck supple.     Right lower leg: Edema present.     Left lower leg: Edema present.     Comments: Stump of the left lower extremity above the knee amputation appears mildly edematous but nontender, no drainage or redness.  Right lower extremity with 2+ pitting edema, tense, rises above the  knee  Lymphadenopathy:     Cervical:  No cervical adenopathy.  Skin:    General: Skin is warm and dry.     Findings: No erythema or rash.  Neurological:     Mental Status: He is alert.     Coordination: Coordination normal.     Comments: Awake alert and answers questions appropriately, no facial droop or focal weakness  Psychiatric:        Behavior: Behavior normal.     ED Results / Procedures / Treatments   Labs (all labs ordered are listed, but only abnormal results are displayed) Labs Reviewed  COMPREHENSIVE METABOLIC PANEL - Abnormal; Notable for the following components:      Result Value   Potassium 3.1 (*)    Glucose, Bld 112 (*)    Calcium 8.3 (*)    All other components within normal limits  CBC - Abnormal; Notable for the following components:   RBC 3.07 (*)    Hemoglobin 9.2 (*)    HCT 30.3 (*)    RDW 17.4 (*)    All other components within normal limits  APTT - Abnormal; Notable for the following components:   aPTT 39 (*)    All other components within normal limits  PROTIME-INR - Abnormal; Notable for the following components:   Prothrombin Time 16.4 (*)    INR 1.3 (*)    All other components within normal limits  BRAIN NATRIURETIC PEPTIDE - Abnormal; Notable for the following components:   B Natriuretic Peptide 498.0 (*)    All other components within normal limits  TROPONIN I (HIGH SENSITIVITY) - Abnormal; Notable for the following components:   Troponin I (High Sensitivity) 18 (*)    All other components within normal limits  RAPID URINE DRUG SCREEN, HOSP PERFORMED  CBG MONITORING, ED  TROPONIN I (HIGH SENSITIVITY)    EKG EKG Interpretation  Date/Time:  Wednesday December 07 2020 15:25:15 EDT Ventricular Rate:  72 PR Interval:  202 QRS Duration: 100 QT Interval:  431 QTC Calculation: 472 R Axis:   -66 Text Interpretation: Sinus or ectopic atrial rhythm Incomplete RBBB and LAFB Anteroseptal infarct, age indeterminate since last tracing no  significant change Confirmed by Eber Hong ([id number]) on 12/07/2020 3:42:22 PM   Radiology DG Chest Portable 1 View  Result Date: 12/07/2020 CLINICAL DATA:  Shortness of breath. EXAM: PORTABLE CHEST 1 VIEW COMPARISON:  Chest x-ray 10/06/2020 FINDINGS: Redemonstration of an enlarged cardiac silhouette. Redemonstration of an enlarged aortic arch. The heart size and mediastinal contours are unchanged with redemonstration of aortic arch/proximal descending thoracic aorta stent graft. Sternotomy wires appear intact. No focal consolidation. Increased interstitial markings. No pleural effusion. No pneumothorax. No acute osseous abnormality. IMPRESSION: 1. Pulmonary edema with no definite pleural effusion. 2. Status post aortic stent graft with redemonstration of an enlarged aortic arch. Electronically Signed   By: Tish Frederickson M.D.   On: 12/07/2020 16:09    Procedures .Critical Care Performed by: Eber Hong, MD Authorized by: Eber Hong, MD   Critical care provider statement:    Critical care time (minutes):  35   Critical care time was exclusive of:  Separately billable procedures and treating other patients and teaching time   Critical care was necessary to treat or prevent imminent or life-threatening deterioration of the following conditions:  Cardiac failure   Critical care was time spent personally by me on the following activities:  Blood draw for specimens, development of treatment plan with patient or surrogate, discussions with consultants, evaluation of patient's response to treatment, examination of patient,  obtaining history from patient or surrogate, ordering and performing treatments and interventions, ordering and review of laboratory studies, ordering and review of radiographic studies, pulse oximetry, re-evaluation of patient's condition and review of old charts     Medications Ordered in ED Medications  furosemide (LASIX) injection 40 mg (has no administration in time  range)  hydrALAZINE (APRESOLINE) injection 10 mg (10 mg Intravenous Given 12/07/20 1544)    ED Course  I have reviewed the triage vital signs and the nursing notes.  Pertinent labs & imaging results that were available during my care of the patient were reviewed by me and considered in my medical decision making (see chart for details).    MDM Rules/Calculators/A&P                          Overall this patient is in mild distress with tachypnea, this improves throughout the discussion as he rests.  He has severe hypertension measuring 220/120.,  He has no chest pain at this time and it appears that the chest pain that he is having is more related to his chest wall discomfort though he does have significant coronary disease.  He is already on Eliquis, he is on a baby aspirin, he is compliant with his medications.  He has run out of hydralazine, I suspect this is somewhat contributing to his hypertension.  That being said with his shortness of breath will evaluate for congestive heart failure or decompensated condition as well as coronary disease.  Anticipate admission to the significant risk and severe uncontrolled hypertension.  His symptoms are not consistent with dissection and with his recent dissection study I do not feel the need to repeat the CT scan at this time.  Pt has slightly elevated opponent at 18, metabolic panel shows no renal dysfunction and a potassium of 3.1, white blood cell count is 4.9, hemoglobin is 9.2 which seems to be close to his baseline based on my review of the medical record, INR is 1.3 and BNP is around 500, last time it was checked was 2 years ago and it was 93  Last echocardiogram shows his ejection fraction was 60 to 65% with grade 2 diastolic dysfunction, that was performed approximately 3 months ago.  Due to increasing swelling orthopnea elevated BNP and findings of pulmonary edema on the x-ray we will admit for acute pulmonary edema in the setting of  likely congestive heart failure and uncontrolled hypertension.  Hospitalist paged for admission  Final Clinical Impression(s) / ED Diagnoses Final diagnoses:  Severe hypertension  Acute on chronic congestive heart failure, unspecified heart failure type (HCC)  Acute pulmonary edema [facility])    Rx / DC Orders ED Discharge Orders    None       Eber Hong, MD 12/07/20 1744

## 2020-12-08 DIAGNOSIS — I160 Hypertensive urgency: Secondary | ICD-10-CM | POA: Diagnosis present

## 2020-12-08 DIAGNOSIS — Z9114 Patient's other noncompliance with medication regimen: Secondary | ICD-10-CM | POA: Diagnosis not present

## 2020-12-08 DIAGNOSIS — I2510 Atherosclerotic heart disease of native coronary artery without angina pectoris: Secondary | ICD-10-CM | POA: Diagnosis present

## 2020-12-08 DIAGNOSIS — I5033 Acute on chronic diastolic (congestive) heart failure: Secondary | ICD-10-CM | POA: Diagnosis not present

## 2020-12-08 DIAGNOSIS — Z951 Presence of aortocoronary bypass graft: Secondary | ICD-10-CM | POA: Diagnosis not present

## 2020-12-08 DIAGNOSIS — I480 Paroxysmal atrial fibrillation: Secondary | ICD-10-CM | POA: Diagnosis present

## 2020-12-08 DIAGNOSIS — Z7982 Long term (current) use of aspirin: Secondary | ICD-10-CM | POA: Diagnosis not present

## 2020-12-08 DIAGNOSIS — N1831 Chronic kidney disease, stage 3a: Secondary | ICD-10-CM | POA: Diagnosis present

## 2020-12-08 DIAGNOSIS — Z823 Family history of stroke: Secondary | ICD-10-CM | POA: Diagnosis not present

## 2020-12-08 DIAGNOSIS — E876 Hypokalemia: Secondary | ICD-10-CM | POA: Diagnosis present

## 2020-12-08 DIAGNOSIS — I10 Essential (primary) hypertension: Secondary | ICD-10-CM | POA: Diagnosis not present

## 2020-12-08 DIAGNOSIS — Z6831 Body mass index (BMI) 31.0-31.9, adult: Secondary | ICD-10-CM | POA: Diagnosis not present

## 2020-12-08 DIAGNOSIS — Z8249 Family history of ischemic heart disease and other diseases of the circulatory system: Secondary | ICD-10-CM | POA: Diagnosis not present

## 2020-12-08 DIAGNOSIS — Z89612 Acquired absence of left leg above knee: Secondary | ICD-10-CM

## 2020-12-08 DIAGNOSIS — F17200 Nicotine dependence, unspecified, uncomplicated: Secondary | ICD-10-CM | POA: Diagnosis present

## 2020-12-08 DIAGNOSIS — Z7901 Long term (current) use of anticoagulants: Secondary | ICD-10-CM | POA: Diagnosis not present

## 2020-12-08 DIAGNOSIS — I130 Hypertensive heart and chronic kidney disease with heart failure and stage 1 through stage 4 chronic kidney disease, or unspecified chronic kidney disease: Secondary | ICD-10-CM | POA: Diagnosis not present

## 2020-12-08 DIAGNOSIS — Z8616 Personal history of COVID-19: Secondary | ICD-10-CM | POA: Diagnosis not present

## 2020-12-08 DIAGNOSIS — Z79899 Other long term (current) drug therapy: Secondary | ICD-10-CM | POA: Diagnosis not present

## 2020-12-08 DIAGNOSIS — Z955 Presence of coronary angioplasty implant and graft: Secondary | ICD-10-CM | POA: Diagnosis not present

## 2020-12-08 DIAGNOSIS — E669 Obesity, unspecified: Secondary | ICD-10-CM | POA: Diagnosis present

## 2020-12-08 DIAGNOSIS — I7100 Dissection of unspecified site of aorta: Secondary | ICD-10-CM | POA: Diagnosis present

## 2020-12-08 DIAGNOSIS — F419 Anxiety disorder, unspecified: Secondary | ICD-10-CM | POA: Diagnosis present

## 2020-12-08 DIAGNOSIS — J810 Acute pulmonary edema: Secondary | ICD-10-CM | POA: Diagnosis not present

## 2020-12-08 DIAGNOSIS — I716 Thoracoabdominal aortic aneurysm, without rupture: Secondary | ICD-10-CM | POA: Diagnosis present

## 2020-12-08 DIAGNOSIS — R0789 Other chest pain: Secondary | ICD-10-CM | POA: Diagnosis present

## 2020-12-08 LAB — BASIC METABOLIC PANEL
Anion gap: 13 (ref 5–15)
BUN: 17 mg/dL (ref 8–23)
CO2: 25 mmol/L (ref 22–32)
Calcium: 8.8 mg/dL — ABNORMAL LOW (ref 8.9–10.3)
Chloride: 104 mmol/L (ref 98–111)
Creatinine, Ser: 1.08 mg/dL (ref 0.61–1.24)
GFR, Estimated: >60 mL/min (ref >60)
Glucose, Bld: 82 mg/dL (ref 70–99)
Potassium: 2.9 mmol/L — ABNORMAL LOW (ref 3.5–5.1)
Sodium: 142 mmol/L (ref 135–145)

## 2020-12-08 LAB — CBC
HCT: 31.7 % — ABNORMAL LOW (ref 39.0–52.0)
Hemoglobin: 9.7 g/dL — ABNORMAL LOW (ref 13.0–17.0)
MCH: 29.6 pg (ref 26.0–34.0)
MCHC: 30.6 g/dL (ref 30.0–36.0)
MCV: 96.6 fL (ref 80.0–100.0)
Platelets: 210 10*3/uL (ref 150–400)
RBC: 3.28 MIL/uL — ABNORMAL LOW (ref 4.22–5.81)
RDW: 17.4 % — ABNORMAL HIGH (ref 11.5–15.5)
WBC: 6 10*3/uL (ref 4.0–10.5)
nRBC: 0 % (ref 0.0–0.2)

## 2020-12-08 MED ORDER — POTASSIUM CHLORIDE CRYS ER 20 MEQ PO TBCR
40.0000 meq | EXTENDED_RELEASE_TABLET | ORAL | Status: AC
  Administered 2020-12-08 – 2020-12-09 (×2): 40 meq via ORAL
  Filled 2020-12-08 (×2): qty 2

## 2020-12-08 MED ORDER — MAGNESIUM SULFATE 2 GM/50ML IV SOLN
2.0000 g | Freq: Once | INTRAVENOUS | Status: AC
  Administered 2020-12-08: 2 g via INTRAVENOUS
  Filled 2020-12-08: qty 50

## 2020-12-08 MED ORDER — ISOSORBIDE MONONITRATE ER 60 MG PO TB24
30.0000 mg | ORAL_TABLET | Freq: Every day | ORAL | Status: DC
  Administered 2020-12-08 – 2020-12-09 (×2): 30 mg via ORAL
  Filled 2020-12-08 (×2): qty 1

## 2020-12-08 NOTE — Plan of Care (Signed)

## 2020-12-08 NOTE — Progress Notes (Signed)
PROGRESS NOTE    Connor [name]  TKZ:[id number] DOB: 1956/01/22 DOA: 12/07/2020 PCP: Hoy Register, MD   Chief complaint: Hypertensive urgency and acute on chronic diastolic heart failure  Brief admission narrative:  As per H&P written by Dr. Randol Kern on 12/07/2020 Connor [name]  is a 65 y.o. male, past medical history for aortic dissection, status post repair, atrial fibrillation, coronary artery disease status post bypass grafting, chronic kidney disease, hypertension and obesity.,  Status post left AKA for necrotizing fasciitis this January. -Patient presents to ED for uncontrolled blood pressure, reports his blood pressure at home elevated as high as 230/190, report he did not take his blood pressure this morning because of the anxiety, as well reports he has been out of his hydralazine for last 3 weeks, unable to obtain refill, does report dyspnea, reports he has been sleeping in upper and upright position on the wheelchair for last couple nights, as well reports worsening lower extremity edema in the right leg, does report chest pain, report it is chronic in nature. -In ED blood pressure is elevated 195/114, elevated BNP 498, troponins 18> 20, chest x-ray significant for pulmonary edema, reports some symptoms improvement after IV Lasix.  Assessment & Plan: 1-acute on chronic diastolic heart failure -In the setting of medication noncompliance, uncontrolled blood pressure and diet indiscretion. -Continue low-sodium diet -Continue to follow daily weights and strict I's and O's -Continue IV diuresis -Continue adjusting blood pressure medications.  2-uncontrolled hypertension -Imdur has been added for better control -Continue IV diuretics, Cardizem, spironolactone, lisinopril, labetalol and hydralazine.  3-hypokalemia -In the setting of acute diuresis -Continue telemetry monitoring -Follow electrolytes and further replete as needed -Will check magnesium level.  4-paroxysmal atrial  fibrillation -Continue diltiazem and apixaban -Rate controlled currently.  5-chest pain -Patient reporting chronic chest discomfort -No acute coronary syndrome per troponin levels -Currently chest pain-free -Most likely associated with heart failure exacerbation and uncontrolled blood pressure.  6-history of COVID-19 infection -Asymptomatic -Patient between 21 and 90 days seems acute infection no requiring isolation currently.  7-history of aortic dissection status post repair -No abdominal pain -Continue blood pressure management. -Continue outpatient follow-up with vascular surgery.  8-history of chronic kidney disease a stage ii-3A -IN TRANSITION POINT -Currently stable and at baseline -Close monitoring while acutely diuresing will be followed.  DVT prophylaxis: Chronically on Eliquis. Code Status: Full code Family Communication: No family at bedside. Disposition:   Status is: Inpatient  Dispo: The patient is from: Home              Anticipated d/c is to: Home              Patient currently is not medically stable for discharge; still with complains of orthopnea and shortness of breath with activity.  Mild fluid overload appreciated on examination.  Continue IV diuresis and adjustment on his BP medication.   Difficult to place patient no       Consultants:   None   Procedures:  See below for x-ray reports.   Antimicrobials:  None   Subjective: Good urine output reported; no chest pain, no nausea, no vomiting.  Still expressing shortness of breath with activity and mild orthopnea.  No requiring oxygen supplementation.  Blood pressure is still fluctuating.  Objective: Vitals:   12/08/20 0135 12/08/20 0543 12/08/20 1038 12/08/20 1315  BP: (!) 153/108 (!) 194/115 (!) 163/93 119/73  Pulse: (!) 126 75 72 71  Resp: 20 18 18 20   Temp: 97.9 F (36.6 C) 98.1 F (  36.7 C) 98.4 F (36.9 C) 98 F (36.7 C)  TempSrc: Oral Oral Oral Oral  SpO2: 96% 95% 98% 97%     Intake/Output Summary (Last 24 hours) at 12/08/2020 1836 Last data filed at 12/08/2020 1831 Gross per 24 hour  Intake 1560 ml  Output 6050 ml  Net -4490 ml   There were no vitals filed for this visit.  Examination:  General exam: No fever, no chest pain, reports no nausea or vomiting.  Still short of breath with activity and complaining of mild orthopnea.  Blood pressure fluctuating. Respiratory system: No wheezing, no using accessory muscles.  Currently no requiring oxygen supplementation.  Fine crackles at the bases. Cardiovascular system: S1 & S2 heard, RRR. No JVD, murmurs, rubs, gallops or clicks. Gastrointestinal system: Abdomen is nondistended, soft and nontender. No organomegaly or masses felt. Normal bowel sounds heard. Central nervous system: Alert and oriented. No focal neurological deficits. Extremities: No cyanosis or clubbing; left AKA.  Right lower extremity edema 1+ on exam. Skin: No petechiae. Psychiatry: Judgement and insight appear normal. Mood & affect appropriate.     Data Reviewed: I have personally reviewed following labs and imaging studies  CBC: Recent Labs  Lab 12/07/20 1545 12/08/20 0522  WBC 4.9 6.0  HGB 9.2* 9.7*  HCT 30.3* 31.7*  MCV 98.7 96.6  PLT 195 210    Basic Metabolic Panel: Recent Labs  Lab 12/07/20 1545 12/08/20 0522  NA 141 142  K 3.1* 2.9*  CL 106 104  CO2 25 25  GLUCOSE 112* 82  BUN 16 17  CREATININE 1.14 1.08  CALCIUM 8.3* 8.8*    GFR: CrCl cannot be calculated (Unknown ideal weight.).  Liver Function Tests: Recent Labs  Lab 12/07/20 1545  AST 22  ALT 17  ALKPHOS 62  BILITOT 0.9  PROT 7.1  ALBUMIN 3.5    CBG: Recent Labs  Lab 12/07/20 1538  GLUCAP 97     Recent Results (from the past 240 hour(s))  Resp Panel by RT-PCR (Flu A&B, Covid) Nasopharyngeal Swab     Status: Abnormal   Collection Time: 12/07/20  5:45 PM   Specimen: Nasopharyngeal Swab; Nasopharyngeal(NP) swabs in vial transport medium   Result Value Ref Range Status   SARS Coronavirus 2 by RT PCR POSITIVE (A) NEGATIVE Final    Comment: RESULT CALLED TO, READ BACK BY AND VERIFIED WITH: LAMBERT,S RN @1907  12/07/20 BY JONES,T (NOTE) SARS-CoV-2 target nucleic acids are DETECTED.  The SARS-CoV-2 RNA is generally detectable in upper respiratory specimens during the acute phase of infection. Positive results are indicative of the presence of the identified virus, but do not rule out bacterial infection or co-infection with other pathogens not detected by the test. Clinical correlation with patient history and other diagnostic information is necessary to determine patient infection status. The expected result is Negative.  Fact Sheet for Patients: 12/09/20  Fact Sheet for Healthcare Providers: BloggerCourse.com  This test is not yet approved or cleared by the [place] FDA and  has been authorized for detection and/or diagnosis of SARS-CoV-2 by FDA under an Emergency Use Authorization (EUA).  This EUA will remain in effect (meaning this test ca n be used) for the duration of  the COVID-19 declaration under Section 564(b)(1) of the Act, 21 U.S.C. section 360bbb-3(b)(1), unless the authorization is terminated or revoked sooner.     Influenza A by PCR NEGATIVE NEGATIVE Final   Influenza B by PCR NEGATIVE NEGATIVE Final    Comment: (NOTE) The Xpert Xpress  SARS-CoV-2/FLU/RSV plus assay is intended as an aid in the diagnosis of influenza from Nasopharyngeal swab specimens and should not be used as a sole basis for treatment. Nasal washings and aspirates are unacceptable for Xpert Xpress SARS-CoV-2/FLU/RSV testing.  Fact Sheet for Patients: BloggerCourse.com  Fact Sheet for Healthcare Providers: SeriousBroker.it  This test is not yet approved or cleared by the [place] FDA and has been authorized for  detection and/or diagnosis of SARS-CoV-2 by FDA under an Emergency Use Authorization (EUA). This EUA will remain in effect (meaning this test can be used) for the duration of the COVID-19 declaration under Section 564(b)(1) of the Act, 21 U.S.C. section 360bbb-3(b)(1), unless the authorization is terminated or revoked.  Performed at [facility], [address], [place], [place] [address]      Radiology Studies: DG Chest Portable 1 View  Result Date: 12/07/2020 CLINICAL DATA:  Shortness of breath. EXAM: PORTABLE CHEST 1 VIEW COMPARISON:  Chest x-ray 10/06/2020 FINDINGS: Redemonstration of an enlarged cardiac silhouette. Redemonstration of an enlarged aortic arch. The heart size and mediastinal contours are unchanged with redemonstration of aortic arch/proximal descending thoracic aorta stent graft. Sternotomy wires appear intact. No focal consolidation. Increased interstitial markings. No pleural effusion. No pneumothorax. No acute osseous abnormality. IMPRESSION: 1. Pulmonary edema with no definite pleural effusion. 2. Status post aortic stent graft with redemonstration of an enlarged aortic arch. Electronically Signed   By: Tish Frederickson M.D.   On: 12/07/2020 16:09    Scheduled Meds: . apixaban  5 mg Oral BID  . aspirin EC  81 mg Oral Daily  . diltiazem  240 mg Oral Daily  . furosemide  40 mg Intravenous BID  . hydrALAZINE  25 mg Oral Q8H  . isosorbide mononitrate  30 mg Oral Daily  . labetalol  200 mg Oral BID  . lisinopril  20 mg Oral Daily  . pantoprazole  40 mg Oral Daily  . sodium chloride flush  3 mL Intravenous Q12H  . spironolactone  25 mg Oral Daily  . traZODone  100 mg Oral QHS   Continuous Infusions: . sodium chloride       LOS: 0 days    Time spent: 30 minutes  Vassie Loll, MD [facility]   To contact the attending provider between 7A-7P or the covering provider during after hours 7P-7A, please log into the web site [email or web address] and access using  universal [place] password for that web site. If you do not have the password, please call the hospital operator.  12/08/2020, 6:36 PM

## 2020-12-09 DIAGNOSIS — J810 Acute pulmonary edema: Secondary | ICD-10-CM

## 2020-12-09 LAB — BASIC METABOLIC PANEL
Anion gap: 10 (ref 5–15)
BUN: 19 mg/dL (ref 8–23)
CO2: 30 mmol/L (ref 22–32)
Calcium: 8.6 mg/dL — ABNORMAL LOW (ref 8.9–10.3)
Chloride: 102 mmol/L (ref 98–111)
Creatinine, Ser: 1.36 mg/dL — ABNORMAL HIGH (ref 0.61–1.24)
GFR, Estimated: 58 mL/min — ABNORMAL LOW (ref >60)
Glucose, Bld: 89 mg/dL (ref 70–99)
Potassium: 3.4 mmol/L — ABNORMAL LOW (ref 3.5–5.1)
Sodium: 142 mmol/L (ref 135–145)

## 2020-12-09 LAB — MAGNESIUM: Magnesium: 1.9 mg/dL (ref 1.7–2.4)

## 2020-12-09 MED ORDER — HYDRALAZINE HCL 25 MG PO TABS: 25.0000 mg | ORAL_TABLET | Freq: Three times a day (TID) | ORAL | 2 refills | Status: DC

## 2020-12-09 MED ORDER — LISINOPRIL 10 MG PO TABS: 20.0000 mg | ORAL_TABLET | Freq: Every day | ORAL | 3 refills | Status: DC

## 2020-12-09 MED ORDER — FUROSEMIDE 40 MG PO TABS: 40.0000 mg | ORAL_TABLET | Freq: Every day | ORAL | 3 refills | Status: DC

## 2020-12-09 MED ORDER — ISOSORBIDE MONONITRATE ER 30 MG PO TB24
30.0000 mg | ORAL_TABLET | Freq: Every day | ORAL | 2 refills | Status: AC
Start: 2020-12-10 — End: ?

## 2020-12-09 NOTE — Plan of Care (Signed)
  Problem: Education: Goal: Knowledge of General Education information will improve Description Including pain rating scale, medication(s)/side effects and non-pharmacologic comfort measures Outcome: Progressing   Problem: Health Behavior/Discharge Planning: Goal: Ability to manage health-related needs will improve Outcome: Progressing   

## 2020-12-09 NOTE — Discharge Summary (Signed)
Physician Discharge Summary  Connor [name] TFT:[id number] DOB: 07-04-1956 DOA: 12/07/2020  PCP: Connor Register, MD  Admit date: 12/07/2020 Discharge date: 12/09/2020  Time spent: 35 minutes  Recommendations for Outpatient Follow-up:  1. Repeat basic metabolic panel to follow close renal function 2. Reassess patient volume status and chills diuretic regimen as needed 3. Reassess blood pressure and further adjust antihypertensive regimen as required.   Discharge Diagnoses:  Active Problems:   CKD (chronic kidney disease), stage III (HCC)   Left above-knee amputee Connor [name])   Essential hypertension   Atypical chest pain   Paroxysmal A-fib (HCC)   Acute on chronic diastolic CHF (congestive heart failure) (HCC)   Acute on chronic diastolic (congestive) heart failure (HCC)   Acute pulmonary edema (HCC)   Discharge Condition: Stable and improved.  Discharged home with instruction to follow-up with PCP in 10 days.  CODE STATUS: Full code.  Diet recommendation: Heart healthy/low-sodium diet.  Filed Weights   12/09/20 0500  Weight: 102.7 kg    History of present illness:  As per H&P written by Dr. Randol [name] on 12/07/2020 [name],past medical history for aortic dissection, status post repair, atrial fibrillation, coronary artery disease status post bypass grafting, chronic kidney disease, hypertension and obesity.,Status post left AKA for necrotizing fasciitis this January. -Patient presents to ED for uncontrolled blood pressure, reports his blood pressure at home elevated as high as 230/190, report he did not take his blood pressure this morning because of the anxiety, as well reports he has been out of his hydralazine for last 3 weeks, unable to obtain refill, does report dyspnea, reports he has been sleeping in upper and upright position on the wheelchair for last couple nights, as well reports worsening lower extremity edema in the right leg, does report chest  pain, report it is chronic in nature. -In ED blood pressure is elevated 195/114, elevated BNP 498, troponins 18>20,chest x-ray significant for pulmonary edema, reports some symptoms improvement after IV Lasix.  Hospital Course:  1-acute on chronic diastolic heart failure -In the setting of medication noncompliance, uncontrolled blood pressure and diet indiscretion. -Continue low-sodium diet -Continue to follow daily weights and adequate hydration. -Discharged on daily oral Lasix. -Continue adjusting blood pressure medications as required..  2-uncontrolled hypertension -Imdur has been added for better control -Patient has been discharge on Lasix 40 mg daily; will also continue Cardizem, spironolactone, lisinopril, labetalol and hydralazine.  3-hypokalemia -In the setting of acute diuresis -Electrolytes have been repleted and is stable at discharge -Repeat basic metabolic panel at follow-up visit to reassess trend and stability. -Magnesium within normal limits.  4-paroxysmal atrial fibrillation -Continue diltiazem and apixaban -Rate controlled currently. -Continue outpatient follow-up with cardiology service.  5-chest pain -Patient reporting chronic chest discomfort -No acute coronary syndrome per troponin levels -Currently chest pain-free -Most likely associated with heart failure exacerbation and uncontrolled blood pressure. -Continue good blood pressure control and heart healthy diet.  6-history of COVID-19 infection -Asymptomatic -Patient between 21 and 90 days since acute infection no requiring isolation currently.  7-history of aortic dissection status post repair -No abdominal pain -Continue blood pressure management. -Continue outpatient follow-up with vascular surgery.  8-history of chronic kidney disease a stage II-3A -IN TRANSITION POINT -Overall stable and at baseline -Continue close monitoring of patient renal function and stability with repeat basic  metabolic panel follow-up visits. -Advised to maintain adequate hydration, to follow lheart healthy diet and to be compliant with medications for adequate BP control.  Procedures:  See below for x-ray  reports.  Consultations:  None  Discharge Exam: Vitals:   12/09/20 0524 12/09/20 0959  BP: (!) 151/90 (!) 150/98  Pulse: 64 71  Resp: 19 18  Temp: 98.5 F (36.9 C)   SpO2: 93% 100%    General: Afebrile, no chest pain, no nausea, no vomiting.  Patient is no requiring O2 supplementation and expressed no orthopnea. Cardiovascular: S1 and S2, no rubs, no gallops, no JVD on exam.  Respiratory: improved air movement bilaterally, no using accessory muscles, no crackles or wheezing appreciated. Abdomen: Positive bowel sounds appreciated; no guarding, no distention, no tenderness. Extremities: Trace edema on his right lower extremity; positive left AKA.  Discharge Instructions   Discharge Instructions    (HEART FAILURE PATIENTS) Call MD:  Anytime you have any of the following symptoms: 1) 3 pound weight gain in 24 hours or 5 pounds in 1 week 2) shortness of breath, with or without a dry hacking cough 3) swelling in the hands, feet or stomach 4) if you have to sleep on extra pillows at night in order to breathe.   Complete by: As directed    Diet - low sodium heart healthy   Complete by: As directed    Discharge instructions   Complete by: As directed    Take medications as prescribed. Arrange follow-up with PCP in 10 days Check your weight on daily basis Maintain adequate hydration Follow heart healthy/low-sodium diet (less than 2.5 g daily).     Allergies as of 12/09/2020   No Known Allergies     Medication List    STOP taking these medications   ibuprofen 200 MG tablet Commonly known as: ADVIL   polyethylene glycol 17 g packet Commonly known as: MIRALAX / GLYCOLAX     TAKE these medications   acetaminophen 325 MG tablet Commonly known as: TYLENOL Take 2 tablets (650  mg total) by mouth every 6 (six) hours as needed for moderate pain.   albuterol 108 (90 Base) MCG/ACT inhaler Commonly known as: VENTOLIN HFA Inhale 2 puffs into the lungs every 6 (six) hours as needed for wheezing or shortness of breath.   aspirin EC 81 MG tablet Take 81 mg by mouth daily. Swallow whole.   Cartia XT 240 MG 24 hr capsule Generic drug: diltiazem TAKE 1 CAPSULE (240 MG TOTAL) BY MOUTH DAILY.   diclofenac Sodium 1 % Gel Commonly known as: VOLTAREN APPLY 2 G TOPICALLY FOUR TIMES DAILY.   Eliquis 5 MG Tabs tablet Generic drug: apixaban TAKE 1 TABLET (5 MG TOTAL) BY MOUTH 2 (TWO) TIMES DAILY.   furosemide 40 MG tablet Commonly known as: Lasix Take 1 tablet (40 mg total) by mouth daily. Take once daily for swelling or shortness of breath. What changed:   how much to take  how to take this  when to take this   gabapentin 400 MG capsule Commonly known as: NEURONTIN TAKE 1 CAPSULE (400 MG TOTAL) BY MOUTH THREE TIMES DAILY.   hydrALAZINE 25 MG tablet Commonly known as: APRESOLINE Take 1 tablet (25 mg total) by mouth every 8 (eight) hours. What changed:   how much to take  how to take this  when to take this   isosorbide mononitrate 30 MG 24 hr tablet Commonly known as: IMDUR Take 1 tablet (30 mg total) by mouth daily. Start taking on: December 10, 2020   labetalol 200 MG tablet Commonly known as: NORMODYNE TAKE 2 TABLETS (400 MG TOTAL) BY MOUTH TWO TIMES DAILY.   lisinopril 10 MG  tablet Commonly known as: ZESTRIL Take 2 tablets (20 mg total) by mouth daily. Start taking on: December 10, 2020 What changed: how much to take   multivitamin with minerals tablet Take 1 tablet by mouth daily. Men   ondansetron 4 MG tablet Commonly known as: Zofran Take 1 tablet (4 mg total) by mouth every 8 (eight) hours as needed for nausea or vomiting.   oxyCODONE 5 MG immediate release tablet Commonly known as: Oxy IR/ROXICODONE Take 1 tablet (5 mg total) by mouth  every 6 (six) hours as needed for severe pain.   pantoprazole 40 MG tablet Commonly known as: PROTONIX Take 1 tablet (40 mg total) by mouth daily for 14 days.   spironolactone 25 MG tablet Commonly known as: ALDACTONE Take 25 mg by mouth daily.   traZODone 100 MG tablet Commonly known as: DESYREL Take 100 mg by mouth at bedtime.      No Known Allergies  Follow-up Information    Connor RegisterNewlin, Enobong, MD. Schedule an appointment as soon as possible for a visit in 10 day(s).   Specialty: Family Medicine Contact information: [address] [place] [address] [phone]        Little IshikawaSchumann, Christopher L, MD .   Specialties: Cardiology, Radiology Contact information: [address] [place] [address] [phone]               The results of significant diagnostics from this hospitalization (including imaging, microbiology, ancillary and laboratory) are listed below for reference.    Significant Diagnostic Studies: DG Chest Portable 1 View  Result Date: 12/07/2020 CLINICAL DATA:  Shortness of breath. EXAM: PORTABLE CHEST 1 VIEW COMPARISON:  Chest x-ray 10/06/2020 FINDINGS: Redemonstration of an enlarged cardiac silhouette. Redemonstration of an enlarged aortic arch. The heart size and mediastinal contours are unchanged with redemonstration of aortic arch/proximal descending thoracic aorta stent graft. Sternotomy wires appear intact. No focal consolidation. Increased interstitial markings. No pleural effusion. No pneumothorax. No acute osseous abnormality. IMPRESSION: 1. Pulmonary edema with no definite pleural effusion. 2. Status post aortic stent graft with redemonstration of an enlarged aortic arch. Electronically Signed   By: Tish FredericksonMorgane  Naveau M.D.   On: 12/07/2020 16:09    Microbiology: Recent Results (from the past 240 hour(s))  Resp Panel by RT-PCR (Flu A&B, Covid) Nasopharyngeal Swab     Status: Abnormal   Collection Time: 12/07/20  5:45 PM    Specimen: Nasopharyngeal Swab; Nasopharyngeal(NP) swabs in vial transport medium  Result Value Ref Range Status   SARS Coronavirus 2 by RT PCR POSITIVE (A) NEGATIVE Final    Comment: RESULT CALLED TO, READ BACK BY AND VERIFIED WITH: LAMBERT,S RN @1907  12/07/20 BY JONES,T (NOTE) SARS-CoV-2 target nucleic acids are DETECTED.  The SARS-CoV-2 RNA is generally detectable in upper respiratory specimens during the acute phase of infection. Positive results are indicative of the presence of the identified virus, but do not rule out bacterial infection or co-infection with other pathogens not detected by the test. Clinical correlation with patient history and other diagnostic information is necessary to determine patient infection status. The expected result is Negative.  Fact Sheet for Patients: BloggerCourse.comhttps://www.fda.gov/media/152166/download  Fact Sheet for Healthcare Providers: SeriousBroker.ithttps://www.fda.gov/media/152162/download  This test is not yet approved or cleared by the [place] FDA and  has been authorized for detection and/or diagnosis of SARS-CoV-2 by FDA under an Emergency Use Authorization (EUA).  This EUA will remain in effect (meaning this test ca n be used) for the duration of  the COVID-19  declaration under Section 564(b)(1) of the Act, 21 U.S.C. section 360bbb-3(b)(1), unless the authorization is terminated or revoked sooner.     Influenza A by PCR NEGATIVE NEGATIVE Final   Influenza B by PCR NEGATIVE NEGATIVE Final    Comment: (NOTE) The Xpert Xpress SARS-CoV-2/FLU/RSV plus assay is intended as an aid in the diagnosis of influenza from Nasopharyngeal swab specimens and should not be used as a sole basis for treatment. Nasal washings and aspirates are unacceptable for Xpert Xpress SARS-CoV-2/FLU/RSV testing.  Fact Sheet for Patients: BloggerCourse.com  Fact Sheet for Healthcare Providers: SeriousBroker.it  This test is not  yet approved or cleared by the [place] FDA and has been authorized for detection and/or diagnosis of SARS-CoV-2 by FDA under an Emergency Use Authorization (EUA). This EUA will remain in effect (meaning this test can be used) for the duration of the COVID-19 declaration under Section 564(b)(1) of the Act, 21 U.S.C. section 360bbb-3(b)(1), unless the authorization is terminated or revoked.  Performed at [facility], [address], [place], [place] [address]      Labs: Basic Metabolic Panel: Recent Labs  Lab 12/07/20 1545 12/08/20 0522 12/09/20 0518  NA 141 142 142  K 3.1* 2.9* 3.4*  CL 106 104 102  CO2 25 25 30   GLUCOSE 112* 82 89  BUN 16 17 19   CREATININE 1.14 1.08 1.36*  CALCIUM 8.3* 8.8* 8.6*  MG  --   --  1.9   Liver Function Tests: Recent Labs  Lab 12/07/20 1545  AST 22  ALT 17  ALKPHOS 62  BILITOT 0.9  PROT 7.1  ALBUMIN 3.5   CBC: Recent Labs  Lab 12/07/20 1545 12/08/20 0522  WBC 4.9 6.0  HGB 9.2* 9.7*  HCT 30.3* 31.7*  MCV 98.7 96.6  PLT 195 210   BNP (last 3 results) Recent Labs    12/07/20 1545  BNP 498.0*    CBG: Recent Labs  Lab 12/07/20 1538  GLUCAP 97    Signed:  12/09/20 MD.  [facility] 12/09/2020, 2:16 PM

## 2020-12-09 NOTE — Plan of Care (Signed)
  Problem: Education: Goal: Knowledge of General Education information will improve Description: Including pain rating scale, medication(s)/side effects and non-pharmacologic comfort measures 12/09/2020 1518 by Karolee Ohs, RN Outcome: Adequate for Discharge 12/09/2020 1517 by Karolee Ohs, RN Outcome: Progressing   Problem: Clinical Measurements: Goal: Ability to maintain clinical measurements within normal limits will improve Outcome: Adequate for Discharge Goal: Will remain free from infection Outcome: Adequate for Discharge Goal: Diagnostic test results will improve Outcome: Adequate for Discharge Goal: Respiratory complications will improve Outcome: Adequate for Discharge Goal: Cardiovascular complication will be avoided Outcome: Adequate for Discharge   Problem: Activity: Goal: Risk for activity intolerance will decrease Outcome: Adequate for Discharge   Problem: Nutrition: Goal: Adequate nutrition will be maintained Outcome: Adequate for Discharge   Problem: Coping: Goal: Level of anxiety will decrease Outcome: Adequate for Discharge   Problem: Elimination: Goal: Will not experience complications related to bowel motility Outcome: Adequate for Discharge Goal: Will not experience complications related to urinary retention Outcome: Adequate for Discharge   Problem: Pain Managment: Goal: General experience of comfort will improve Outcome: Adequate for Discharge   Problem: Safety: Goal: Ability to remain free from injury will improve Outcome: Adequate for Discharge   Problem: Skin Integrity: Goal: Risk for impaired skin integrity will decrease Outcome: Adequate for Discharge

## 2020-12-09 NOTE — TOC Initial Note (Signed)
Transition of Care [facility]) - Initial/Assessment Note    Patient Details  Name: Connor [name] MRN: [id number] Date of Birth: 1955/10/12  Transition of Care [facility] CM/SW Contact:    Annice Needy, LCSW Phone Number: 12/09/2020, 3:26 PM  Clinical Narrative:                 Patient from home. Readmitted. Considered high risk for readmission. Lives with cousin. Active with HHRN through [facility] Resumption orders placed.   Expected Discharge Plan: Home w Home Health Services Barriers to Discharge: No Barriers Identified   Patient Goals and CMS Choice        Expected Discharge Plan and Services Expected Discharge Plan: Home w Home Health Services         Expected Discharge Date: 12/09/20                                    Prior Living Arrangements/Services     Patient language and need for interpreter reviewed:: Yes Do you feel safe going back to the place where you live?: Yes      Need for Family Participation in Patient Care: Yes (Comment) Care giver support system in place?: Yes (comment) Current home services: DME,Home RN Criminal Activity/Legal Involvement Pertinent to Current Situation/Hospitalization: No - Comment as needed  Activities of Daily Living Home Assistive Devices/Equipment: Eyeglasses,Walker (specify type),Wheelchair,Bedside commode/3-in-1,Shower chair with back ADL Screening (condition at time of admission) Patient's cognitive ability adequate to safely complete daily activities?: Yes Is the patient deaf or have difficulty hearing?: No Does the patient have difficulty seeing, even when wearing glasses/contacts?: No Does the patient have difficulty concentrating, remembering, or making decisions?: No Patient able to express need for assistance with ADLs?: Yes Does the patient have difficulty dressing or bathing?: Yes Independently performs ADLs?: Yes (appropriate for developmental age) Does the patient have difficulty walking or climbing stairs?:  Yes Weakness of Legs: Both Weakness of Arms/Hands: None  Permission Sought/Granted                  Emotional Assessment              Admission diagnosis:  Acute pulmonary edema (HCC) [J81.0] Severe hypertension [I10] Acute on chronic diastolic CHF (congestive heart failure) (HCC) [I50.33] Acute on chronic congestive heart failure, unspecified heart failure type (HCC) [I50.9] Acute on chronic diastolic (congestive) heart failure (HCC) [I50.33] Patient Active Problem List   Diagnosis Date Noted  . Acute pulmonary edema (HCC)   . Acute on chronic diastolic (congestive) heart failure (HCC) 12/08/2020  . Acute on chronic diastolic CHF (congestive heart failure) (HCC) 12/07/2020  . Paroxysmal A-fib (HCC) 10/10/2020  . Gastritis 10/10/2020  . Dyspepsia 10/07/2020  . CKD stage G2/A2, GFR 60-89 and albumin creatinine ratio 30-299 mg/g 10/07/2020  . Atypical chest pain 10/06/2020  . Acute blood loss anemia   . Essential hypertension   . Postoperative pain   . Severe anemia 09/19/2020  . History of left above knee amputation (HCC) 09/19/2020  . Left above-knee amputee (HCC) 09/19/2020  . 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 CKD (chronic kidney disease), stage III (HCC) 09/01/2020  . Morbid obesity (HCC) 09/01/2020  . Severe sepsis with acute organ dysfunction due to Streptococcus species (HCC) 08/30/2020  . Cellulitis of left leg 08/30/2020  . Lactic acidosis 08/30/2020  . Elevated troponin 08/30/2020  . Hypoalbuminemia 08/30/2020  . Leukocytosis 08/30/2020  .  Thrombocytopenia (HCC) 08/30/2020  . Hypokalemia 08/30/2020  . Hyperglycemia 08/30/2020  . Transaminitis 08/30/2020  . Total bilirubin, elevated 08/30/2020  . AKI (acute kidney injury) (HCC) 08/30/2020  . Nausea vomiting and diarrhea 08/30/2020  . Hypertension 05/20/2020  . Persistent atrial fibrillation (HCC)    PCP:  Hoy Register, MD Pharmacy:   [facility] -  [place], [place] - [address] [place] [address] Phone: [phone] Fax: [phone]     Social Determinants of Health (SDOH) Interventions    Readmission Risk Interventions Readmission Risk Prevention Plan 10/07/2020  Transportation Screening Complete  HRI or Home Care Consult Complete  Social Work Consult for Recovery Care Planning/Counseling Complete  Palliative Care Screening Not Applicable  Medication Review [employer]) Complete

## 2020-12-09 NOTE — Progress Notes (Signed)
Nsg Discharge Note  Admit Date:  12/07/2020 Discharge date: 12/09/2020   [name] to be D/C'd Home per MD order.  AVS completed.  Copy for chart, and copy for patient signed, and dated. Patient/caregiver able to verbalize understanding. Removed IV-CDI. Reviewed d/c paperwork with patient and answered all questions. Wheeled stable patient and belongings to main entrance. Discharge Medication: Allergies as of 12/09/2020   No Known Allergies     Medication List    STOP taking these medications   ibuprofen 200 MG tablet Commonly known as: ADVIL   polyethylene glycol 17 g packet Commonly known as: MIRALAX / GLYCOLAX     TAKE these medications   acetaminophen 325 MG tablet Commonly known as: TYLENOL Take 2 tablets (650 mg total) by mouth every 6 (six) hours as needed for moderate pain.   albuterol 108 (90 Base) MCG/ACT inhaler Commonly known as: VENTOLIN HFA Inhale 2 puffs into the lungs every 6 (six) hours as needed for wheezing or shortness of breath.   aspirin EC 81 MG tablet Take 81 mg by mouth daily. Swallow whole.   Cartia XT 240 MG 24 hr capsule Generic drug: diltiazem TAKE 1 CAPSULE (240 MG TOTAL) BY MOUTH DAILY.   diclofenac Sodium 1 % Gel Commonly known as: VOLTAREN APPLY 2 G TOPICALLY FOUR TIMES DAILY.   Eliquis 5 MG Tabs tablet Generic drug: apixaban TAKE 1 TABLET (5 MG TOTAL) BY MOUTH 2 (TWO) TIMES DAILY.   furosemide 40 MG tablet Commonly known as: Lasix Take 1 tablet (40 mg total) by mouth daily. Take once daily for swelling or shortness of breath. What changed:   how much to take  how to take this  when to take this   gabapentin 400 MG capsule Commonly known as: NEURONTIN TAKE 1 CAPSULE (400 MG TOTAL) BY MOUTH THREE TIMES DAILY.   hydrALAZINE 25 MG tablet Commonly known as: APRESOLINE Take 1 tablet (25 mg total) by mouth every 8 (eight) hours. What changed:   how much to take  how to take this  when to take this   isosorbide  mononitrate 30 MG 24 hr tablet Commonly known as: IMDUR Take 1 tablet (30 mg total) by mouth daily. Start taking on: December 10, 2020   labetalol 200 MG tablet Commonly known as: NORMODYNE TAKE 2 TABLETS (400 MG TOTAL) BY MOUTH TWO TIMES DAILY.   lisinopril 10 MG tablet Commonly known as: ZESTRIL Take 2 tablets (20 mg total) by mouth daily. Start taking on: December 10, 2020 What changed: how much to take   multivitamin with minerals tablet Take 1 tablet by mouth daily. Men   ondansetron 4 MG tablet Commonly known as: Zofran Take 1 tablet (4 mg total) by mouth every 8 (eight) hours as needed for nausea or vomiting.   oxyCODONE 5 MG immediate release tablet Commonly known as: Oxy IR/ROXICODONE Take 1 tablet (5 mg total) by mouth every 6 (six) hours as needed for severe pain.   pantoprazole 40 MG tablet Commonly known as: PROTONIX Take 1 tablet (40 mg total) by mouth daily for 14 days.   spironolactone 25 MG tablet Commonly known as: ALDACTONE Take 25 mg by mouth daily.   traZODone 100 MG tablet Commonly known as: DESYREL Take 100 mg by mouth at bedtime.       Discharge Assessment: Vitals:   12/09/20 0959 12/09/20 1533  BP: (!) 150/98 131/78  Pulse: 71 62  Resp: 18 20  Temp:  98.2 F (36.8 C)  SpO2: 100%  97%   Skin clean, dry and intact without evidence of skin break down, no evidence of skin tears noted. IV catheter discontinued intact. Site without signs and symptoms of complications - no redness or edema noted at insertion site, patient denies c/o pain - only slight tenderness at site.  Dressing with slight pressure applied.  D/c Instructions-Education: Discharge instructions given to patient/family with verbalized understanding. D/c education completed with patient/family including follow up instructions, medication list, d/c activities limitations if indicated, with other d/c instructions as indicated by MD - patient able to verbalize understanding, all questions  fully answered. Patient instructed to return to ED, call 911, or call MD for any changes in condition.  Patient escorted via WC, and D/C home via private auto.  Karolee Ohs, RN 12/09/2020 3:57 PM

## 2020-12-12 ENCOUNTER — Telehealth: Payer: Self-pay

## 2020-12-12 NOTE — Telephone Encounter (Signed)
Transition Care Management Unsuccessful Follow-up Telephone Call  Date of discharge and from where:  12/09/2020, [facility]   Attempts:  1st Attempt  Reason for unsuccessful TCM follow-up call:  Left voice message on # [phone].  Call back requested to this CM.  Patient has appointment with Dr Alvis Lemmings @ [facility] 12/26/2020.

## 2020-12-13 ENCOUNTER — Telehealth: Payer: Self-pay

## 2020-12-13 NOTE — Telephone Encounter (Signed)
Transition Care Management Unsuccessful Follow-up Telephone Call  Date of discharge and from where:  12/09/2020, [facility]   Attempts:  2nd Attempt  Reason for unsuccessful TCM follow-up call:  Voice mail full, unable to leave a message on # [phone].   Patient has appointment with Dr Alvis Lemmings @ [facility] 12/26/2020.

## 2020-12-14 ENCOUNTER — Telehealth: Payer: Self-pay

## 2020-12-14 NOTE — Telephone Encounter (Signed)
Transition Care Management Unsuccessful Follow-up Telephone Call- call returned to patient.   Date of discharge and from where:  12/09/2020, [facility]   Attempts:  3rd Attempt  Reason for unsuccessful TCM follow-up call:  Voice mail full, unable to leave a message on # [phone].   Patient has appointment with Dr Alvis Lemmings @ [facility] 12/26/2020.

## 2020-12-14 NOTE — Telephone Encounter (Signed)
Pt returned call/ please advise

## 2020-12-15 ENCOUNTER — Telehealth: Payer: Self-pay

## 2020-12-15 NOTE — Telephone Encounter (Signed)
Call placed to the patient for discharge follow up.  Dr Alvis Lemmings is listed as her PCP but he has never seen her. He said that his PCP is Erskine Speed, NP in [place] and he saw her yesterday and will continue to follow up with her  Dr Alvis Lemmings removed as PCP in [employer]

## 2020-12-26 ENCOUNTER — Ambulatory Visit: Payer: Medicaid Other | Admitting: Endocrinology

## 2020-12-26 ENCOUNTER — Ambulatory Visit: Payer: Medicaid Other | Admitting: Family Medicine

## 2021-01-11 ENCOUNTER — Other Ambulatory Visit: Payer: Self-pay

## 2021-01-11 DIAGNOSIS — Z89612 Acquired absence of left leg above knee: Secondary | ICD-10-CM

## 2021-02-04 ENCOUNTER — Emergency Department (HOSPITAL_COMMUNITY): Payer: Medicaid Other

## 2021-02-04 ENCOUNTER — Emergency Department (HOSPITAL_COMMUNITY)
Admission: EM | Admit: 2021-02-04 | Discharge: 2021-02-04 | Disposition: A | Discharge status: Home / Self Care | Payer: Medicaid Other | Attending: Emergency Medicine | Admitting: Emergency Medicine

## 2021-02-04 ENCOUNTER — Other Ambulatory Visit: Payer: Self-pay

## 2021-02-04 ENCOUNTER — Encounter (HOSPITAL_COMMUNITY): Payer: Self-pay

## 2021-02-04 DIAGNOSIS — Z7901 Long term (current) use of anticoagulants: Secondary | ICD-10-CM | POA: Insufficient documentation

## 2021-02-04 DIAGNOSIS — J181 Lobar pneumonia, unspecified organism: Secondary | ICD-10-CM | POA: Diagnosis not present

## 2021-02-04 DIAGNOSIS — R0789 Other chest pain: Secondary | ICD-10-CM | POA: Diagnosis present

## 2021-02-04 DIAGNOSIS — Z7982 Long term (current) use of aspirin: Secondary | ICD-10-CM | POA: Diagnosis not present

## 2021-02-04 DIAGNOSIS — R079 Chest pain, unspecified: Secondary | ICD-10-CM

## 2021-02-04 DIAGNOSIS — I7103 Dissection of thoracoabdominal aorta: Secondary | ICD-10-CM | POA: Insufficient documentation

## 2021-02-04 DIAGNOSIS — J189 Pneumonia, unspecified organism: Secondary | ICD-10-CM

## 2021-02-04 DIAGNOSIS — N183 Chronic kidney disease, stage 3 unspecified: Secondary | ICD-10-CM | POA: Diagnosis not present

## 2021-02-04 DIAGNOSIS — Z955 Presence of coronary angioplasty implant and graft: Secondary | ICD-10-CM | POA: Diagnosis not present

## 2021-02-04 DIAGNOSIS — I130 Hypertensive heart and chronic kidney disease with heart failure and stage 1 through stage 4 chronic kidney disease, or unspecified chronic kidney disease: Secondary | ICD-10-CM | POA: Insufficient documentation

## 2021-02-04 DIAGNOSIS — I480 Paroxysmal atrial fibrillation: Secondary | ICD-10-CM | POA: Insufficient documentation

## 2021-02-04 DIAGNOSIS — E876 Hypokalemia: Secondary | ICD-10-CM | POA: Diagnosis not present

## 2021-02-04 DIAGNOSIS — I5033 Acute on chronic diastolic (congestive) heart failure: Secondary | ICD-10-CM | POA: Diagnosis not present

## 2021-02-04 DIAGNOSIS — Z79899 Other long term (current) drug therapy: Secondary | ICD-10-CM | POA: Diagnosis not present

## 2021-02-04 DIAGNOSIS — E119 Type 2 diabetes mellitus without complications: Secondary | ICD-10-CM | POA: Insufficient documentation

## 2021-02-04 DIAGNOSIS — F17210 Nicotine dependence, cigarettes, uncomplicated: Secondary | ICD-10-CM | POA: Insufficient documentation

## 2021-02-04 LAB — URINALYSIS, ROUTINE W REFLEX MICROSCOPIC
Bacteria, UA: NONE SEEN
Bilirubin Urine: NEGATIVE
Glucose, UA: NEGATIVE mg/dL
Ketones, ur: NEGATIVE mg/dL
Leukocytes,Ua: NEGATIVE
Nitrite: NEGATIVE
Protein, ur: NEGATIVE mg/dL
Specific Gravity, Urine: 1.011 (ref 1.005–1.030)
pH: 7 (ref 5.0–8.0)

## 2021-02-04 LAB — TROPONIN I (HIGH SENSITIVITY)
Troponin I (High Sensitivity): 23 ng/L — ABNORMAL HIGH (ref <18)
Troponin I (High Sensitivity): 28 ng/L — ABNORMAL HIGH (ref <18)

## 2021-02-04 LAB — CBC WITH DIFFERENTIAL/PLATELET
Abs Immature Granulocytes: 0.01 10*3/uL (ref 0.00–0.07)
Basophils Absolute: 0 10*3/uL (ref 0.0–0.1)
Basophils Relative: 1 %
Eosinophils Absolute: 0.2 10*3/uL (ref 0.0–0.5)
Eosinophils Relative: 4 %
HCT: 31.3 % — ABNORMAL LOW (ref 39.0–52.0)
Hemoglobin: 9.6 g/dL — ABNORMAL LOW (ref 13.0–17.0)
Immature Granulocytes: 0 %
Lymphocytes Relative: 16 %
Lymphs Abs: 0.7 10*3/uL (ref 0.7–4.0)
MCH: 29.8 pg (ref 26.0–34.0)
MCHC: 30.7 g/dL (ref 30.0–36.0)
MCV: 97.2 fL (ref 80.0–100.0)
Monocytes Absolute: 0.4 10*3/uL (ref 0.1–1.0)
Monocytes Relative: 9 %
Neutro Abs: 3.4 10*3/uL (ref 1.7–7.7)
Neutrophils Relative %: 70 %
Platelets: 153 10*3/uL (ref 150–400)
RBC: 3.22 MIL/uL — ABNORMAL LOW (ref 4.22–5.81)
RDW: 14.5 % (ref 11.5–15.5)
WBC: 4.7 10*3/uL (ref 4.0–10.5)
nRBC: 0 % (ref 0.0–0.2)

## 2021-02-04 LAB — COMPREHENSIVE METABOLIC PANEL
ALT: 20 U/L (ref 0–44)
AST: 28 U/L (ref 15–41)
Albumin: 3.9 g/dL (ref 3.5–5.0)
Alkaline Phosphatase: 77 U/L (ref 38–126)
Anion gap: 6 (ref 5–15)
BUN: 15 mg/dL (ref 8–23)
CO2: 27 mmol/L (ref 22–32)
Calcium: 8.7 mg/dL — ABNORMAL LOW (ref 8.9–10.3)
Chloride: 106 mmol/L (ref 98–111)
Creatinine, Ser: 1.05 mg/dL (ref 0.61–1.24)
GFR, Estimated: >60 mL/min (ref >60)
Glucose, Bld: 90 mg/dL (ref 70–99)
Potassium: 2.9 mmol/L — ABNORMAL LOW (ref 3.5–5.1)
Sodium: 139 mmol/L (ref 135–145)
Total Bilirubin: 1.2 mg/dL (ref 0.3–1.2)
Total Protein: 7.7 g/dL (ref 6.5–8.1)

## 2021-02-04 LAB — BRAIN NATRIURETIC PEPTIDE: B Natriuretic Peptide: 267 pg/mL — ABNORMAL HIGH (ref 0.0–100.0)

## 2021-02-04 MED ORDER — IOHEXOL 350 MG/ML SOLN
100.0000 mL | Freq: Once | INTRAVENOUS | Status: AC | PRN
  Administered 2021-02-04: 100 mL via INTRAVENOUS

## 2021-02-04 MED ORDER — SODIUM CHLORIDE 0.9 % IV SOLN
500.0000 mg | Freq: Once | INTRAVENOUS | Status: AC
  Administered 2021-02-04: 500 mg via INTRAVENOUS
  Filled 2021-02-04: qty 500

## 2021-02-04 MED ORDER — ESMOLOL HCL-SODIUM CHLORIDE 2000 MG/100ML IV SOLN
25.0000 ug/kg/min | INTRAVENOUS | Status: DC
  Administered 2021-02-04: 25 ug/kg/min via INTRAVENOUS
  Filled 2021-02-04: qty 100

## 2021-02-04 MED ORDER — CLEVIDIPINE BUTYRATE 0.5 MG/ML IV EMUL
0.0000 mg/h | INTRAVENOUS | Status: DC
  Administered 2021-02-04: 1 mg/h via INTRAVENOUS
  Filled 2021-02-04 (×2): qty 50

## 2021-02-04 MED ORDER — POTASSIUM CHLORIDE 10 MEQ/100ML IV SOLN
10.0000 meq | Freq: Once | INTRAVENOUS | Status: AC
  Administered 2021-02-04: 10 meq via INTRAVENOUS
  Filled 2021-02-04: qty 100

## 2021-02-04 MED ORDER — HYDROMORPHONE HCL 1 MG/ML IJ SOLN
1.0000 mg | Freq: Once | INTRAMUSCULAR | Status: AC
  Administered 2021-02-04: 1 mg via INTRAVENOUS
  Filled 2021-02-04: qty 1

## 2021-02-04 MED ORDER — HYDRALAZINE HCL 20 MG/ML IJ SOLN
20.0000 mg | Freq: Once | INTRAMUSCULAR | Status: AC
  Administered 2021-02-04: 20 mg via INTRAVENOUS
  Filled 2021-02-04: qty 1

## 2021-02-04 MED ORDER — MORPHINE SULFATE (PF) 4 MG/ML IV SOLN
4.0000 mg | Freq: Once | INTRAVENOUS | Status: AC
  Administered 2021-02-04: 4 mg via INTRAVENOUS
  Filled 2021-02-04: qty 1

## 2021-02-04 MED ORDER — SODIUM CHLORIDE 0.9 % IV SOLN
1.0000 g | Freq: Once | INTRAVENOUS | Status: AC
  Administered 2021-02-04: 1 g via INTRAVENOUS
  Filled 2021-02-04: qty 10

## 2021-02-04 NOTE — ED Triage Notes (Signed)
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 Chest pain and SOB has been on going x 3 days. Pt also c/o right upper leg pain.

## 2021-02-04 NOTE — ED Notes (Signed)
Pt placed on 2L [place]

## 2021-02-04 NOTE — ED Provider Notes (Signed)
[facility] EMERGENCY DEPARTMENT Provider Note   CSN: [id number] Arrival date & time: 02/04/21  1220     History Chief Complaint  Patient presents with   Chest Pain    Connor [name] is a 65 y.o. male with PMHx HTN, CHF with EF 60-65%, CAD s/p CABG, CKD stage II, Diabetes with hx of L AKA, Anemia, A fib on Eliquis, and hx of abdominal aortic aneurysm repair who presents to the ED today via EMS with complaint of gradual onset, constant, waxing and waning, right sided chest pain/pressure for the past 3 days. Pt also complains of shortness of breath. The pain began while he was cleaning his house the other day. He reports he had some improvement last night in his pain prior to going to sleep however this morning it was more severe.   Pt also complains of worsening right leg swelling/pain for the past week. He reports hx of recurrent swelling in his leg that typically will resolve with elevation. Pt has been elevating it to no avail. Denies fevers, chills, redness to the leg.   Pt was recently admitted to the hospital in April for uncontrolled HTN and acute on chronic CHF exacerbation. Pt states he has been compliant with all of his medications and last took his blood pressure medication this morning.   The history is provided by the patient, the EMS personnel and medical records.      Past Medical History:  Diagnosis Date   AAA (abdominal aortic aneurysm) (HCC)    History of open heart surgery    Hypertension    Seroma due to trauma [facility])     Patient Active Problem List   Diagnosis Date Noted   Acute pulmonary edema (HCC)    Acute on chronic diastolic (congestive) heart failure (HCC) 12/08/2020   Acute on chronic diastolic CHF (congestive heart failure) (HCC) 12/07/2020   Paroxysmal A-fib (HCC) 10/10/2020   Gastritis 10/10/2020   Dyspepsia 10/07/2020   CKD stage G2/A2, GFR 60-89 and albumin creatinine ratio 30-299 mg/g 10/07/2020   Atypical chest pain 10/06/2020   Acute blood  loss anemia    Essential hypertension    Postoperative pain    Severe anemia 09/19/2020   History of left above knee amputation (HCC) 09/19/2020   Left above-knee amputee (HCC) 09/19/2020   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)    CKD (chronic kidney disease), stage III (HCC) 09/01/2020   Morbid obesity (HCC) 09/01/2020   Severe sepsis with acute organ dysfunction due to Streptococcus species (HCC) 08/30/2020   Cellulitis of left leg 08/30/2020   Lactic acidosis 08/30/2020   Elevated troponin 08/30/2020   Hypoalbuminemia 08/30/2020   Leukocytosis 08/30/2020   Thrombocytopenia (HCC) 08/30/2020   Hypokalemia 08/30/2020   Hyperglycemia 08/30/2020   Transaminitis 08/30/2020   Total bilirubin, elevated 08/30/2020   AKI (acute kidney injury) (HCC) 08/30/2020   Nausea vomiting and diarrhea 08/30/2020   Hypertension 05/20/2020   Persistent atrial fibrillation (HCC)     Past Surgical History:  Procedure Laterality Date   ABDOMINAL AORTIC ANEURYSM REPAIR     AMPUTATION Left 09/07/2020   Procedure: ATTEMPTED LEFT LEG DEBRIDEMENT FASCIOTOMIES, APPLY INSTILLATION WOUND VAC, ABOVE KNEE AMPUTATION;  Surgeon: Nadara Mustard, MD;  Location: [facility] OR;  Service: Orthopedics;  Laterality: Left;   BUBBLE STUDY  09/05/2020   Procedure: BUBBLE STUDY;  Surgeon: Meriam Sprague, MD;  Location: [facility] ENDOSCOPY;  Service: Cardiovascular;;   CARDIOVERSION N/A 02/11/2020   Procedure: CARDIOVERSION;  Surgeon: Epifanio Lesches  L, MD;  Location: [facility] ENDOSCOPY;  Service: Cardiovascular;  Laterality: N/A;   CORONARY ARTERY BYPASS GRAFT     STUMP REVISION Left 09/09/2020   Procedure: REVISION LEFT ABOVE KNEE AMPUTATION;  Surgeon: Nadara Mustard, MD;  Location: [facility] OR;  Service: Orthopedics;  Laterality: Left;   TEE WITHOUT CARDIOVERSION N/A 09/05/2020   Procedure: TRANSESOPHAGEAL ECHOCARDIOGRAM (TEE);  Surgeon: Meriam Sprague, MD;  Location: [facility] ENDOSCOPY;  Service: Cardiovascular;   Laterality: N/A;       Family History  Problem Relation Age of Onset   CAD Mother    CVA Father     Social History   Tobacco Use   Smoking status: Every Day    Packs/day: 0.50    Pack years: 0.00    Types: Cigarettes   Smokeless tobacco: Never  Vaping Use   Vaping Use: Never used  Substance Use Topics   Alcohol use: Never   Drug use: Never    Home Medications Prior to Admission medications   Medication Sig Start Date End Date Taking? Authorizing Provider  acetaminophen (TYLENOL) 325 MG tablet Take 2 tablets (650 mg total) by mouth every 6 (six) hours as needed for moderate pain. 10/10/20   Arrien, York Ram, MD  albuterol (VENTOLIN HFA) 108 (90 Base) MCG/ACT inhaler Inhale 2 puffs into the lungs every 6 (six) hours as needed for wheezing or shortness of breath.    [provider]  aspirin EC 81 MG tablet Take 81 mg by mouth daily. Swallow whole.    [provider]  diclofenac Sodium (VOLTAREN) 1 % GEL APPLY 2 G TOPICALLY FOUR TIMES DAILY. 09/29/20 09/29/21  Angiulli, Mcarthur Rossetti, PA-C  diltiazem (CARDIZEM CD) 240 MG 24 hr capsule TAKE 1 CAPSULE (240 MG TOTAL) BY MOUTH DAILY. Patient not taking: Reported on 12/07/2020 09/29/20 09/29/21  Angiulli, Mcarthur Rossetti, PA-C  ELIQUIS 5 MG TABS tablet TAKE 1 TABLET (5 MG TOTAL) BY MOUTH 2 (TWO) TIMES DAILY. 09/29/20 09/29/21  Angiulli, Mcarthur Rossetti, PA-C  furosemide (LASIX) 40 MG tablet Take 1 tablet (40 mg total) by mouth daily. Take once daily for swelling or shortness of breath. 12/09/20   Vassie Loll, MD  gabapentin (NEURONTIN) 400 MG capsule TAKE 1 CAPSULE (400 MG TOTAL) BY MOUTH THREE TIMES DAILY. 09/29/20 09/29/21  Angiulli, Mcarthur Rossetti, PA-C  hydrALAZINE (APRESOLINE) 25 MG tablet Take 1 tablet (25 mg total) by mouth every 8 (eight) hours. 12/09/20   Vassie Loll, MD  isosorbide mononitrate (IMDUR) 30 MG 24 hr tablet Take 1 tablet (30 mg total) by mouth daily. 12/10/20   Vassie Loll, MD  labetalol (NORMODYNE) 200 MG tablet TAKE 2  TABLETS (400 MG TOTAL) BY MOUTH TWO TIMES DAILY. 09/29/20 09/29/21  Angiulli, Mcarthur Rossetti, PA-C  lisinopril (ZESTRIL) 10 MG tablet Take 2 tablets (20 mg total) by mouth daily. 12/10/20   Vassie Loll, MD  Multiple Vitamins-Minerals (MULTIVITAMIN WITH MINERALS) tablet Take 1 tablet by mouth daily. Men    [provider]  ondansetron (ZOFRAN) 4 MG tablet Take 1 tablet (4 mg total) by mouth every 8 (eight) hours as needed for nausea or vomiting. Patient not taking: No sig reported 10/27/20   Durward Parcel, FNP  oxyCODONE (OXY IR/ROXICODONE) 5 MG immediate release tablet Take 1 tablet (5 mg total) by mouth every 6 (six) hours as needed for severe pain. 10/24/20   Persons, [place], PA  pantoprazole (PROTONIX) 40 MG tablet Take 1 tablet (40 mg total) by mouth daily for 14 days. 10/10/20  10/24/20  Arrien, York Ram, MD  spironolactone (ALDACTONE) 25 MG tablet Take 25 mg by mouth daily.    [provider]  traZODone (DESYREL) 100 MG tablet Take 100 mg by mouth at bedtime. 11/28/20   [provider]    Allergies    Patient has no known allergies.  Review of Systems   Review of Systems  Constitutional:  Negative for chills, diaphoresis and fever.  Respiratory:  Positive for shortness of breath. Negative for cough.   Cardiovascular:  Positive for chest pain and leg swelling. Negative for palpitations.  Gastrointestinal:  Negative for abdominal pain, nausea and vomiting.  All other systems reviewed and are negative.  Physical Exam Updated Vital Signs BP (!) 210/110 (BP Location: Left Arm)   Pulse 67   Temp 98.1 F (36.7 C) (Oral)   Resp 20   Ht  (1.803 m)   Wt 111.1 kg   SpO2 94%   BMI 34.17 kg/m   Physical Exam Vitals and nursing note reviewed.  Constitutional:      Appearance: He is obese. He is not ill-appearing or diaphoretic.  HENT:     Head: Normocephalic and atraumatic.  Eyes:     Conjunctiva/sclera: Conjunctivae normal.  Cardiovascular:      Rate and Rhythm: Normal rate and regular rhythm.     Pulses:          Radial pulses are 2+ on the right side and 2+ on the left side.       Dorsalis pedis pulses are 2+ on the right side.     Heart sounds: Normal heart sounds.  Pulmonary:     Effort: Pulmonary effort is normal.     Breath sounds: Normal breath sounds. No decreased breath sounds, wheezing, rhonchi or rales.  Chest:     Chest wall: No tenderness.     Comments: Midline incisional scar and right anterior chest wall incision Abdominal:     General: Bowel sounds are normal.     Palpations: Abdomen is soft.     Tenderness: There is no abdominal tenderness. There is no guarding or rebound.  Musculoskeletal:     Cervical back: Neck supple.     Comments: Left AKA Right leg mild swollen with TTP to the calf and medial thigh with overlying skin thickening and 1+ pitting edema. 2+ DP pulse.   Skin:    General: Skin is warm and dry.  Neurological:     Mental Status: He is alert.    ED Results / Procedures / Treatments   Labs (all labs ordered are listed, but only abnormal results are displayed) Labs Reviewed  COMPREHENSIVE METABOLIC PANEL - Abnormal; Notable for the following components:      Result Value   Potassium 2.9 (*)    Calcium 8.7 (*)    All other components within normal limits  CBC WITH DIFFERENTIAL/PLATELET - Abnormal; Notable for the following components:   RBC 3.22 (*)    Hemoglobin 9.6 (*)    HCT 31.3 (*)    All other components within normal limits  BRAIN NATRIURETIC PEPTIDE - Abnormal; Notable for the following components:   B Natriuretic Peptide 267.0 (*)    All other components within normal limits  URINALYSIS, ROUTINE W REFLEX MICROSCOPIC - Abnormal; Notable for the following components:   Hgb urine dipstick SMALL (*)    All other components within normal limits  TROPONIN I (HIGH SENSITIVITY) - Abnormal; Notable for the following components:   Troponin I (High  Sensitivity) 23 (*)    All other  components within normal limits  TROPONIN I (HIGH SENSITIVITY) - Abnormal; Notable for the following components:   Troponin I (High Sensitivity) 28 (*)    All other components within normal limits    EKG EKG Interpretation  Date/Time:  Saturday February 04 2021 12:32:30 EDT Ventricular Rate:  66 PR Interval:    QRS Duration: 102 QT Interval:  446 QTC Calculation: 468 R Axis:   -69 Text Interpretation: Junctional rhythm vs sinus Incomplete RBBB and LAFB No significant change since prior 4/22 Confirmed by Meridee ScoreButler, Michael [phone]([id number]) on 02/04/2021 12:38:11 PM  Radiology DG Chest Port 1 View  Result Date: 02/04/2021 CLINICAL DATA:  Pain EXAM: PORTABLE CHEST 1 VIEW COMPARISON:  December 07, 2020 FINDINGS: The cardiomediastinal silhouette is unchanged and enlarged in contour.Status post median sternotomy and repair of the aorta. Again noted is enlargement of the aortic arch silhouette along the aortic graft contours, unchanged in comparison to prior. Unchanged elevation of the RIGHT hemithorax. No pleural effusion. No pneumothorax. Unchanged pulmonary vasculature prominence with mild diffuse interstitial prominence. Visualized abdomen is unremarkable. Surgical clips project over the RIGHT axilla. IMPRESSION: Unchanged enlarged cardiomediastinal silhouette status post aortic arch repair. Mild diffuse interstitial prominence likely reflects an unchanged degree of mild pulmonary edema. Electronically Signed   By: Meda KlinefelterStephanie  Peacock MD   On: 02/04/2021 13:30   CT Angio Chest/Abd/Pel for Dissection W and/or W/WO  Result Date: 02/04/2021 CLINICAL DATA:  Chest pain and shortness of breath for the past 3 days. Previous thoracic aortic aneurysm repair in 2019. EXAM: CT ANGIOGRAPHY CHEST, ABDOMEN AND PELVIS TECHNIQUE: Non-contrast CT of the chest was initially obtained. Multidetector CT imaging through the chest, abdomen and pelvis was performed using the standard protocol during bolus administration of intravenous  contrast. Multiplanar reconstructed images and MIPs were obtained and reviewed to evaluate the vascular anatomy. CONTRAST:  100mL OMNIPAQUE IOHEXOL 350 MG/ML SOLN COMPARISON:  10/06/2020. FINDINGS: CTA CHEST FINDINGS Cardiovascular: Enlarged heart with a mild decrease in size. Stable tube graft repair of the ascending thoracic aorta are and stent graft placement in the aortic arch. Again demonstrated is an aortic dissection from the distal aspect of the stent graft extending inferiorly. Previously demonstrated contrast in the false lumen extends more inferiorly on the current examination to the level of the mid chest. The remainder of the false lumen remains thrombosed inferiorly. Diffuse aneurysmal dilatation of the descending thoracic aorta is stable, continuing to measure 5 cm maximum short axis diameter and currently measuring 5.8 cm long axis diameter on image number 50/4, previously approximately 5.6 cm. The examination was not tailored for maximal opacification of the pulmonary arteries. The pulmonary vasculature remains prominent. The central pulmonary arteries are also prominent with a main pulmonary artery diameter of 3.5 cm, previously 3.2 cm. No pulmonary arterial filling defects are seen. No dissection involving the aortic arch branches. These are normally opacified. Mediastinum/Nodes: Multiple enlarged mediastinal lymph nodes with mild progression. The largest is a right precarinal node with a short axis diameter of 2.4 cm on image number 39/4, previously 1.9 cm in corresponding diameter. No enlarged hilar nodes. Unremarkable thyroid gland and esophagus. Lungs/Pleura: Interval small to moderate-sized right pleural effusion. Minimal left pleural fluid with mild progression. Minimal dependent atelectasis. Interval small amount of patchy opacity in the posterior right upper lobe. Musculoskeletal: Post median sternotomy changes. Mild thoracic and lower cervical spine degenerative changes. Review of the MIP  images confirms the above findings. CTA ABDOMEN AND PELVIS  FINDINGS VASCULAR Aorta: The aortic dissection continues to extend through the abdominal aorta with a stent crossing the flap at the level of the renal arteries. The left renal artery remains opacified by the patent false lumen with a proximal left renal artery stent again demonstrated. The false lumen is opacified superiorly to just below the level of the diaphragmatic hiatus, with mild further proximal extension of the opacified portion since the previous examination. Celiac: The dissection flap continues to extend into the celiac axis with opacification of the true and false lumens. SMA: The dissection flap continues to extend into the superior mesenteric artery with opacification of the true and false lumens. Renals: Both renal arteries are patent without evidence of aneurysm, dissection, vasculitis, fibromuscular dysplasia or significant stenosis. IMA: Patent without evidence of aneurysm, dissection, vasculitis or significant stenosis. Inflow: Stent extending from the distal true lumen into the dissected left common iliac artery with a 2nd stent in the left external iliac artery. These are both patent. The dissection continues to extend in to the left internal iliac artery which also remains patent, including the true and false lumens. The right common iliac artery Mains opacified by the true lumen without dissection extending into the iliac system on the right. Veins: No obvious venous abnormality within the limitations of this arterial phase study. Review of the MIP images confirms the above findings. NON-VASCULAR Hepatobiliary: Mild diffuse low density of the liver. Previously noted multiple small gallstones in the gallbladder. No gallbladder wall thickening or pericholecystic fluid suspicious for acute cholecystitis. Pancreas: Unremarkable. No pancreatic ductal dilatation or surrounding inflammatory changes. Spleen: Normal in size without focal  abnormality. Adrenals/Urinary Tract: Adrenal glands are unremarkable. Kidneys are normal, without renal calculi, focal lesion, or hydronephrosis. Bladder is unremarkable. Stomach/Bowel: Multiple colonic diverticula without evidence of diverticulitis. Normal appearing appendix, small bowel and stomach. Lymphatic: No enlarged lymph nodes. Reproductive: Stable mildly to moderately enlarged prostate gland. Other: No abdominal wall hernia or abnormality. No abdominopelvic ascites. Musculoskeletal: Minimal lumbar spine degenerative changes. Review of the MIP images confirms the above findings. IMPRESSION: 1. Interval small amount of patchy opacity in the posterior aspect of the right upper lobe. This could represent a small amount of pneumonia or atelectasis. 2. Complex thoracoabdominal aneurysm and dissection, status post repair of the ascending thoracic aorta and stent graft in the aortic arch. The only difference is mild further extension of the contrast opacified portion of the false lumen in the upper abdomen and mid chest. 3. Mildly progressive mild mediastinal adenopathy. 4. Mild diffuse hepatic steatosis. 5. Cholelithiasis. 6. Colonic diverticulosis. Electronically Signed   By: Beckie Salts M.D.   On: 02/04/2021 15:17    Procedures .Critical Care  Date/Time: 02/04/2021 4:33 PM Performed by: Tanda Rockers, PA-C Authorized by: Tanda Rockers, PA-C   Critical care provider statement:    Critical care time (minutes):  50   Critical care was time spent personally by me on the following activities:  Discussions with consultants, evaluation of patient's response to treatment, examination of patient, ordering and performing treatments and interventions, ordering and review of laboratory studies, ordering and review of radiographic studies, pulse oximetry, re-evaluation of patient's condition, obtaining history from patient or surrogate and review of old charts   Medications Ordered in ED Medications   azithromycin (ZITHROMAX) 500 mg in sodium chloride 0.9 % 250 mL IVPB (500 mg Intravenous New Bag/Given 02/04/21 1632)  esmolol (BREVIBLOC) 2000 mg / 100 mL (20 mg/mL) infusion (has no administration in time range)  clevidipine (CLEVIPREX)  infusion 0.5 mg/mL (has no administration in time range)  hydrALAZINE (APRESOLINE) injection 20 mg (20 mg Intravenous Given 02/04/21 1304)  potassium chloride 10 mEq in 100 mL IVPB (0 mEq Intravenous Stopped 02/04/21 1452)  morphine 4 MG/ML injection 4 mg (4 mg Intravenous Given 02/04/21 1347)  iohexol (OMNIPAQUE) 350 MG/ML injection 100 mL (100 mLs Intravenous Contrast Given 02/04/21 1410)  cefTRIAXone (ROCEPHIN) 1 g in sodium chloride 0.9 % 100 mL IVPB (0 g Intravenous Stopped 02/04/21 1627)  HYDROmorphone (DILAUDID) injection 1 mg (1 mg Intravenous Given 02/04/21 1546)    ED Course  I have reviewed the triage vital signs and the nursing notes.  Pertinent labs & imaging results that were available during my care of the patient were reviewed by me and considered in my medical decision making (see chart for details).  Clinical Course as of 02/04/21 1633  Sat Feb 04, 2021  1530 65 year old male with prior aortic dissection repair here with chest pain and fluid overload on his legs and chest.  Blood pressure elevated.  Getting labs EKG chest x-ray and will need further imaging.  Disposition per results of work-up. [MB]    Clinical Course User Index [MB] Terrilee Files, MD   MDM Rules/Calculators/A&P                          65 year old male who presents to the ED today with complaint of chest pain and shortness of breath for the past 3 days.  Also complaining of some right leg swelling/pain for the past week.  On arrival to the ED today patient is afebrile, nontachycardic and nontachypneic.  Satting 94% on room air which does appear to be around patient's baseline.  Blood pressure significantly elevated at 210/110.  Was recently admitted in April for  uncontrolled high blood pressure as well as CHF exacerbation.  Has been compliant with his medications per patient.  He does have a history of abdominal aortic aneurysm repair as well.  He states the pain sometimes radiates to his back.  Lower suspicion for dissection however given history will plan for CTA if patient is able to tolerate for kidney function.  Will provide IV hydralazine for high blood pressure as well as pain medication.  Will obtain troponin, BNP, chest x-ray and plan to DVT rule out his right leg.  Patient will likely require admission due to history.  Attending physician Dr. Charm Barges has seen patient and agrees with plan.   EKG without acute ischemic changes today CBC without leukocytosis. Hgb stable at 9.6 CMP with potassium 2.9; will replete. No other electrolyte abnormalities Troponin 23; will repeat BNP 267 which appears around pt's baseline  CXR: IMPRESSION:  Unchanged enlarged cardiomediastinal silhouette status post aortic  arch repair. Mild diffuse interstitial prominence likely reflects an  unchanged degree of mild pulmonary edema.   Blood pressure has decreased with IV hydralazine; will continue to monitor.   Appears that ultrasound is no longer available for DVT study however given pt states he has not missed any doses of Eliquis lower suspicion for same.   CTA: IMPRESSION:  1. Interval small amount of patchy opacity in the posterior aspect  of the right upper lobe. This could represent a small amount of  pneumonia or atelectasis.  2. Complex thoracoabdominal aneurysm and dissection, status post  repair of the ascending thoracic aorta and stent graft in the aortic  arch. The only difference is mild further extension of the contrast  opacified portion of the false lumen in the upper abdomen and mid  chest.  3. Mildly progressive mild mediastinal adenopathy.  4. Mild diffuse hepatic steatosis.  5. Cholelithiasis.  6. Colonic diverticulosis.   In the setting  of chest pain, elevated BP on arrival, and worsening dissection will consult vascular surgery at this time. On further assessment pt reports only minimal improvement in symptoms after morphine; will provide additional pain medication at this time. Will also start on abx to cover for possible pneumonia given right sided chest pain.   Discussed case with Vascular Surgeon Dr. Lenell Antu who evaluated CT scan; recommends sending pt to [facility] or [facility] for further evaluation given complexity of previous aneurysm repair.   Discussed case with [facility] Vascular Surgeon Dr. Fenton Malling who recommends transfer to [facility] for further care. He recommends Esmolol drip with HR < 80 and Nifedipine/Clevidipine drip  with BP < 120. Would also like CT images power shared at this time. Transfer initiated at this time. It appears pt was COVID positive 04/22 and therefore repeat COVID test not collected.   This note was prepared using Dragon voice recognition software and may include unintentional dictation errors due to the inherent limitations of voice recognition software.  Final Clinical Impression(s) / ED Diagnoses Final diagnoses:  Nonspecific chest pain  Dissection of thoracoabdominal aorta (HCC)  Community acquired pneumonia of right upper lobe of lung  Hypokalemia    Rx / DC Orders ED Discharge Orders     None        Tanda Rockers, PA-C 02/04/21 1633    Terrilee Files, MD 02/04/21 1800

## 2021-02-20 ENCOUNTER — Ambulatory Visit: Payer: Medicaid Other

## 2021-03-01 ENCOUNTER — Other Ambulatory Visit: Payer: Self-pay

## 2021-03-01 ENCOUNTER — Ambulatory Visit: Payer: Medicaid Other | Attending: Orthopedic Surgery

## 2021-03-01 DIAGNOSIS — Z89612 Acquired absence of left leg above knee: Secondary | ICD-10-CM | POA: Diagnosis present

## 2021-03-01 DIAGNOSIS — R2689 Other abnormalities of gait and mobility: Secondary | ICD-10-CM | POA: Diagnosis present

## 2021-03-01 DIAGNOSIS — M6281 Muscle weakness (generalized): Secondary | ICD-10-CM | POA: Diagnosis present

## 2021-03-01 NOTE — Therapy (Addendum)
[facility] [address] [place], [place], [address] Phone: [phone]   Fax:  [phone]  Physical Therapy Evaluation  Patient Details  Name: Connor SequinRobert [name] MRN: [id number] Date of Birth: [birth year] Referring Provider (PT): Dr. Lajoyce Cornersuda   Encounter Date: 03/01/2021   PT End of Session - 03/01/21 1232     Visit Number 1    Number of Visits 4    Date for PT Re-Evaluation 05/10/21    Authorization Type Medicaid    Authorization Time Period 03/01/21 to 05/10/21; 27 visits Max, 4 visits for first month then request more    Authorization - Visit Number 1    Authorization - Number of Visits 4    Progress Note Due on Visit 4    PT Start Time 1110    PT Stop Time 1155    PT Time Calculation (min) 45 min    Equipment Utilized During Treatment Gait belt    Activity Tolerance Patient tolerated treatment well    Behavior During Therapy WFL for tasks assessed/performed             Past Medical History:  Diagnosis Date   AAA (abdominal aortic aneurysm) (HCC)    History of open heart surgery    Hypertension    Seroma due to trauma [facility](HCC)     Past Surgical History:  Procedure Laterality Date   ABDOMINAL AORTIC ANEURYSM REPAIR     AMPUTATION Left 09/07/2020   Procedure: ATTEMPTED LEFT LEG DEBRIDEMENT FASCIOTOMIES, APPLY INSTILLATION WOUND VAC, ABOVE KNEE AMPUTATION;  Surgeon: Nadara Mustarduda, Marcus V, MD;  Location: [facility] OR;  Service: Orthopedics;  Laterality: Left;   BUBBLE STUDY  09/05/2020   Procedure: BUBBLE STUDY;  Surgeon: Meriam SpraguePemberton, Heather E, MD;  [facility];  Service: Cardiovascular;;   CARDIOVERSION N/A 02/11/2020   Procedure: CARDIOVERSION;  Surgeon: Little IshikawaSchumann, Christopher L, MD;  [facility];  Service: Cardiovascular;  Laterality: N/A;   CORONARY ARTERY BYPASS GRAFT     STUMP REVISION Left 09/09/2020   Procedure: REVISION LEFT ABOVE KNEE AMPUTATION;  Surgeon: Nadara Mustarduda, Marcus V, MD;  [facility];  Service:  Orthopedics;  Laterality: Left;   TEE WITHOUT CARDIOVERSION N/A 09/05/2020   Procedure: TRANSESOPHAGEAL ECHOCARDIOGRAM (TEE);  Surgeon: Meriam SpraguePemberton, Heather E, MD;  [facility];  Service: Cardiovascular;  Laterality: N/A;    There were no vitals filed for this visit.    Subjective Assessment - 03/01/21 1214     Subjective Pt received his new prosthetic leg about 1.5 months ago. Patient reports currently he is wearing it all day.    Pertinent History L AKA 09/09/20 with Dr. Lajoyce Cornersuda, Received prosthetic leg in 12/2020; PMHx HTN, CHF with EF 60-65%, CAD s/p CABG, CKD stage II, Diabetes with hx of L AKA,    Limitations Standing;Walking;House hold activities;Lifting    How long can you sit comfortably? no issues    How long can you stand comfortably? <5 min    How long can you walk comfortably? <5 min    Patient Stated Goals Improve walking    Currently in Pain? Yes    Pain Score 7     Pain Location Leg    Pain Orientation Left    Pain Type Neuropathic pain    Pain Onset More than a month ago    Pain Frequency Constant                [facility] PT Assessment - 03/01/21 1217  Assessment   Medical Diagnosis L AKA    Referring Provider (PT) Dr. Lajoyce Corners    Onset Date/Surgical Date 09/09/20    Prior Therapy Acute, CIR      Precautions   Precautions Fall    Required Braces or Orthoses --   L prosthetic leg     Restrictions   Weight Bearing Restrictions No      Balance Screen   Has the patient fallen in the past 6 months No      Home Environment   Living Environment Private residence    Living Arrangements Other relatives   Cousin   Available Help at Discharge Family    Type of Home House    Home Access Stairs to enter;Ramped entrance    Entrance Stairs-Number of Steps 6      Prior Function   Level of Independence Independent      Cognition   Overall Cognitive Status Within Functional Limits for tasks assessed      Observation/Other Assessments   Skin Integrity  WNL, keloid scar tissues noted on residula leg, no drainage, no redness, no blisters      ROM / Strength   AROM / PROM / Strength PROM      PROM   PROM Assessment Site Hip    Right/Left Hip Left    Left Hip Extension -5      Bed Mobility   Bed Mobility Rolling Right;Rolling Left;Supine to Sit    Rolling Right Independent    Rolling Left Independent    Supine to Sit Independent      Transfers   Transfers Stand Pivot Transfers    Stand Pivot Transfers 7: Independent      Ambulation/Gait   Ambulation/Gait Yes    Ambulation/Gait Assistance 6: Modified independent (Device/Increase time)    Ambulation Distance (Feet) 115 Feet    Assistive device Rolling walker    Gait Pattern Decreased weight shift to left;Lateral hip instability    Ambulation Surface Level      Standardized Balance Assessment   Standardized Balance Assessment Timed Up and Go Test      Timed Up and Go Test   Normal TUG (seconds) 60   With RW                       Objective measurements completed on examination: See above findings.    Prosthetic Education provided: Sweating increases with an amputation. Your body is trying to regulate your temperature & without an extremity, you sweat more easily to cool off. Also prosthetic material like liners do not breath and add hot layers which causes even more sweating. With time your body typically will accommodate to prosthesis and your sweat level will come closer to level with amputation but not pre-amputation level.   You need to pat your limb & liner dry when you notice sweating. If you leave sweat trapped inside your liner, then it can result in a blister.   Signs of sweating in your liner: You are sweating elsewhere on your body or you notice sweat running / dripping.  Take note of how high your liner comes up on your limb when you first put your liner on your limb. If you notice that your liner has slipped down, then you probably have sweat inside  your liner. A good time to check for liner slippage is when toileting.  You feel air bubbles inside your liner. When you liner slips, then air is allowed in  bottom. As you put weight on prosthesis, the air is burp or pushed out. You feel something crawling or moving inside your liner. When sweat runs inside the closed system of liner, it often feels like a bug or something crawling inside your liner.  If any of above symptoms are noted, you need to remove your prosthesis & liner to pat your limb & liner dry. This is permanent need as leaving sweat or water trapped can result in a blister or wound.    Pt educated on washing inner liner with mild soap and water with wet towel and let it air dry over night. Pt educated on wiping inside of the socket with antibacterial wipes to keep it clean. Pt educated never to wash socket with water. If water get in there, invert it and let it drip and air dry.  Pt educated on using deodrant over residula leg to help with sweating issues. Pt educated on performing skin checks often by using the hand held mirror.  Pt educated on towel desensitization over residula limb to decrease sensitivity and pain management for 5-10 min.            PT Short Term Goals - 03/01/21 1221       PT SHORT TERM GOAL #1   Title Pt will verbalize on how to properly clean components of prosthetic leg to maintain safe and sanitary environment for residual limb during wearing of prosthetic leg    Baseline Educated    Time 4    Period Weeks    Status New    Target Date 03/29/21      PT SHORT TERM GOAL #2   Title Pt will perform skin check twice a day to monitor for any skin break downs from prosthetic leg.    Baseline Educated    Time 4    Period Weeks    Status New    Target Date 03/29/21      PT SHORT TERM GOAL #3   Title Pt will carry extra pairs of socks and properly adjust for congruent fit of residula leg in his prosthetic leg to accomodate for fluid loss in  residula leg.    Baseline Educated    Time 4    Period Weeks    Status New    Target Date 03/29/21               PT Long Term Goals - 03/01/21 1224       PT LONG TERM GOAL #1   Title Patient will demo Timed up and go test score of <30 seconds with RW to improve functional mobility    Baseline 60 sec (03/01/21)    Time 10    Period Weeks    Status New    Target Date 05/10/21      PT LONG TERM GOAL #2   Title Pt will be able to ambulate 230 feet with RW and SBA to improve walking endurance    Baseline 115' with RW    Time 6    Period Weeks    Status New    Target Date 04/12/21      PT LONG TERM GOAL #3   Title Pt will be able to ambulate >500 feet with RW and SBA to improve community walking endurance    Baseline 115' RW and SBA (03/01/21)    Time 10    Period Weeks    Status New    Target Date 05/10/21  PT LONG TERM GOAL #4   Title Pt will primarily use his RW for inhome and community mobility to improve independence.    Baseline Uses wheelchair mostly and RW (intermittently) for in home mobility    Time 10    Period Weeks    Status New    Target Date 05/10/21                    Plan - 03/01/21 1227     Clinical Impression Statement Patient is a 65 y.o. male who was seen today in physical therapy for evaluation and treamtnet for gait and mobility training and prosthetics training after Above knee amputation of L Leg in 08/2020. Patient demonstrates decreased L hip extension ROM, decreased knowledge of how to properly care for prosthetic leg, residual leg, decreased functional gait, balance and mobility, and has neuropathic pain in his residula leg. Patient will beenfit from skilled PT to address above impairments and improve overall function by working towards his short term and long term functional goals.    Personal Factors and Comorbidities Age;Comorbidity 3+;Past/Current Experience;Time since onset of injury/illness/exacerbation;Transportation     Comorbidities MHx HTN, CHF with EF 60-65%, CAD s/p CABG, CKD stage II, Diabetes with hx of L AKA, Anemia, A fib on Eliquis, and hx of abdominal aortic aneurysm repair   09/09/20 revision left AKA    Examination-Activity Limitations Bathing;Bed Mobility;Bend;Caring for Others;Carry;Hygiene/Grooming;Locomotion Level;Squat;Stairs;Stand;Toileting;Transfers    Examination-Participation Restrictions Church;Cleaning;Community Activity;Driving;Laundry;Meal Prep;Yard Work;Shop    Stability/Clinical Decision Making Stable/Uncomplicated    Clinical Decision Making Moderate    Rehab Potential Good    PT Frequency --   1-2x/week   PT Duration Other (comment)   10 weeeks   PT Treatment/Interventions ADLs/Self Care Home Management;Cryotherapy;Electrical Stimulation;Moist Heat;Gait training;Stair training;Functional mobility training;Therapeutic activities;Therapeutic exercise;Balance training;Manual techniques;Wheelchair mobility training;Prosthetic Training;Patient/family education;Neuromuscular re-education;Scar mobilization;Passive range of motion;Energy conservation;Joint Manipulations    PT Next Visit Plan Review skin check, don/doff of prosthetic leg, skin care. Work on ambulation, HEP, issue HEP with hip extension (supine off EOB, and prone on elbows)    PT Home Exercise Plan Verbally reviewed Hip extension in supine off edge of bed    Consulted and Agree with Plan of Care Patient             Patient will benefit from skilled therapeutic intervention in order to improve the following deficits and impairments:  Abnormal gait, Decreased activity tolerance, Decreased balance, Decreased endurance, Decreased knowledge of precautions, Decreased mobility, Decreased range of motion, Decreased safety awareness, Difficulty walking, Decreased strength, Increased edema, Increased fascial restricitons, Impaired flexibility, Improper body mechanics, Postural dysfunction, Pain, Prosthetic Dependency, Impaired  sensation  Visit Diagnosis: Other abnormalities of gait and mobility  Muscle weakness (generalized)  Hx of AKA (above knee amputation), left [facility])     Problem List Patient Active Problem List   Diagnosis Date Noted   Acute pulmonary edema (HCC)    Acute on chronic diastolic (congestive) heart failure (HCC) 12/08/2020   Acute on chronic diastolic CHF (congestive heart failure) (HCC) 12/07/2020   Paroxysmal A-fib (HCC) 10/10/2020   Gastritis 10/10/2020   Dyspepsia 10/07/2020   CKD stage G2/A2, GFR 60-89 and albumin creatinine ratio 30-299 mg/g 10/07/2020   Atypical chest pain 10/06/2020   Acute blood loss anemia    Essential hypertension    Postoperative pain    Severe anemia 09/19/2020   History of left above knee amputation (HCC) 09/19/2020   Left above-knee amputee (HCC) 09/19/2020   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)    CKD (chronic kidney disease), stage III (HCC) 09/01/2020   Morbid obesity (HCC) 09/01/2020   Severe sepsis with acute organ dysfunction due to Streptococcus species (HCC) 08/30/2020   Cellulitis of left leg 08/30/2020   Lactic acidosis 08/30/2020   Elevated troponin 08/30/2020   Hypoalbuminemia 08/30/2020   Leukocytosis 08/30/2020   Thrombocytopenia (HCC) 08/30/2020   Hypokalemia 08/30/2020   Hyperglycemia 08/30/2020   Transaminitis 08/30/2020   Total bilirubin, elevated 08/30/2020   AKI (acute kidney injury) (HCC) 08/30/2020   Nausea vomiting and diarrhea 08/30/2020   Hypertension 05/20/2020   Persistent atrial fibrillation (HCC)     Ileana Ladd, PT 03/01/2021, 12:33 PM  [facility] [address] [place], [place], [address] Phone: [phone]   Fax:  [phone]  Name: Connor [name] MRN: [id number] Date of Birth: June 27, 1956

## 2021-03-13 ENCOUNTER — Other Ambulatory Visit: Payer: Self-pay

## 2021-03-13 ENCOUNTER — Ambulatory Visit: Payer: Medicaid Other

## 2021-03-13 DIAGNOSIS — R2689 Other abnormalities of gait and mobility: Secondary | ICD-10-CM

## 2021-03-13 DIAGNOSIS — M6281 Muscle weakness (generalized): Secondary | ICD-10-CM

## 2021-03-13 DIAGNOSIS — Z89612 Acquired absence of left leg above knee: Secondary | ICD-10-CM

## 2021-03-13 NOTE — Therapy (Signed)
[facility] [address] [place], [place], [address] Phone: [phone]   Fax:  [phone]  Physical Therapy Treatment  Patient Details  Name: Connor [name] MRN: [id number] Date of Birth: 08/12/1956 Referring Provider (PT): Dr. Lajoyce Corners   Encounter Date: 03/13/2021   PT End of Session - 03/13/21 1117     Visit Number 2    Number of Visits 4    Date for PT Re-Evaluation 05/10/21    Authorization Type Medicaid    Authorization Time Period 03/01/21 to 05/10/21; 27 visits Max, 4 visits for first month then request more    Authorization - Visit Number 2    Authorization - Number of Visits 4    Progress Note Due on Visit 4    PT Start Time 1115    PT Stop Time 1145    PT Time Calculation (min) 30 min    Equipment Utilized During Treatment Gait belt    Activity Tolerance Patient tolerated treatment well    Behavior During Therapy WFL for tasks assessed/performed             Past Medical History:  Diagnosis Date   AAA (abdominal aortic aneurysm) (HCC)    History of open heart surgery    Hypertension    Seroma due to trauma [facility])     Past Surgical History:  Procedure Laterality Date   ABDOMINAL AORTIC ANEURYSM REPAIR     AMPUTATION Left 09/07/2020   Procedure: ATTEMPTED LEFT LEG DEBRIDEMENT FASCIOTOMIES, APPLY INSTILLATION WOUND VAC, ABOVE KNEE AMPUTATION;  Surgeon: Nadara Mustard, MD;  Location: [facility] OR;  Service: Orthopedics;  Laterality: Left;   BUBBLE STUDY  09/05/2020   Procedure: BUBBLE STUDY;  Surgeon: Meriam Sprague, MD;  Location: [facility] ENDOSCOPY;  Service: Cardiovascular;;   CARDIOVERSION N/A 02/11/2020   Procedure: CARDIOVERSION;  Surgeon: Little Ishikawa, MD;  Location: [facility] ENDOSCOPY;  Service: Cardiovascular;  Laterality: N/A;   CORONARY ARTERY BYPASS GRAFT     STUMP REVISION Left 09/09/2020   Procedure: REVISION LEFT ABOVE KNEE AMPUTATION;  Surgeon: Nadara Mustard, MD;  Location: [facility] OR;  Service:  Orthopedics;  Laterality: Left;   TEE WITHOUT CARDIOVERSION N/A 09/05/2020   Procedure: TRANSESOPHAGEAL ECHOCARDIOGRAM (TEE);  Surgeon: Meriam Sprague, MD;  Location: [facility] ENDOSCOPY;  Service: Cardiovascular;  Laterality: N/A;    There were no vitals filed for this visit.   Subjective Assessment - 03/13/21 1117     Subjective Pt reports of throb in residula leg on and off. He has been walking at home. No falls. He rverbalizes proper care of prosthetic componenets.    Pertinent History L AKA 09/09/20 with Dr. Lajoyce Corners, Received prosthetic leg in 12/2020; PMHx HTN, CHF with EF 60-65%, CAD s/p CABG, CKD stage II, Diabetes with hx of L AKA,    Limitations Standing;Walking;House hold activities;Lifting    How long can you sit comfortably? no issues    How long can you stand comfortably? <5 min    How long can you walk comfortably? <5 min    Patient Stated Goals Improve walking    Pain Onset More than a month ago                 Soft tissue work to L hip flexors: 5' Hip flexor stretching: supine off edge of bed: 3 x 1' Reviewed proper don/doff prosthetic leg, adjusting for correct ply socks to accomodate for fluid change throughtout the day, drying sweat off leg and liner thorughout the  day as he sweats more Gait training: 1 x 230' with RW, 4 standing breaks, cues for longer step with R LE to improve L knee flexion and shifting weight on L LE                        PT Short Term Goals - 03/01/21 1221       PT SHORT TERM GOAL #1   Title Pt will verbalize on how to properly clean components of prosthetic leg to maintain safe and sanitary environment for residual limb during wearing of prosthetic leg    Baseline Educated    Time 4    Period Weeks    Status New    Target Date 03/29/21      PT SHORT TERM GOAL #2   Title Pt will perform skin check twice a day to monitor for any skin break downs from prosthetic leg.    Baseline Educated    Time 4    Period Weeks     Status New    Target Date 03/29/21      PT SHORT TERM GOAL #3   Title Pt will carry extra pairs of socks and properly adjust for congruent fit of residula leg in his prosthetic leg to accomodate for fluid loss in residula leg.    Baseline Educated    Time 4    Period Weeks    Status New    Target Date 03/29/21               PT Long Term Goals - 03/01/21 1224       PT LONG TERM GOAL #1   Title Patient will demo Timed up and go test score of <30 seconds with RW to improve functional mobility    Baseline 60 sec (03/01/21)    Time 10    Period Weeks    Status New    Target Date 05/10/21      PT LONG TERM GOAL #2   Title Pt will be able to ambulate 230 feet with RW and SBA to improve walking endurance    Baseline 115' with RW    Time 6    Period Weeks    Status New    Target Date 04/12/21      PT LONG TERM GOAL #3   Title Pt will be able to ambulate >500 feet with RW and SBA to improve community walking endurance    Baseline 115' RW and SBA (03/01/21)    Time 10    Period Weeks    Status New    Target Date 05/10/21      PT LONG TERM GOAL #4   Title Pt will primarily use his RW for inhome and community mobility to improve independence.    Baseline Uses wheelchair mostly and RW (intermittently) for in home mobility    Time 10    Period Weeks    Status New    Target Date 05/10/21                   Plan - 03/13/21 1133     Clinical Impression Statement Todays session was focused on educating patient on how to perform stretching for L hip and back, continued education on proper ply sock selection for improved congruency and to adjust for fluid changes in residula leg as day goe on and gait training. Pt gets SOB with walking and requried 4 standing breaks for 230' with RW  today.    Personal Factors and Comorbidities Age;Comorbidity 3+;Past/Current Experience;Time since onset of injury/illness/exacerbation;Transportation    Comorbidities MHx HTN, CHF with EF  60-65%, CAD s/p CABG, CKD stage II, Diabetes with hx of L AKA, Anemia, A fib on Eliquis, and hx of abdominal aortic aneurysm repair   09/09/20 revision left AKA    Examination-Activity Limitations Bathing;Bed Mobility;Bend;Caring for Others;Carry;Hygiene/Grooming;Locomotion Level;Squat;Stairs;Stand;Toileting;Transfers    Examination-Participation Restrictions Church;Cleaning;Community Activity;Driving;Laundry;Meal Prep;Yard Work;Shop    Stability/Clinical Decision Making Stable/Uncomplicated    Rehab Potential Good    PT Frequency --   1-2x/week   PT Duration Other (comment)   10 weeeks   PT Treatment/Interventions ADLs/Self Care Home Management;Cryotherapy;Electrical Stimulation;Moist Heat;Gait training;Stair training;Functional mobility training;Therapeutic activities;Therapeutic exercise;Balance training;Manual techniques;Wheelchair mobility training;Prosthetic Training;Patient/family education;Neuromuscular re-education;Scar mobilization;Passive range of motion;Energy conservation;Joint Manipulations    PT Next Visit Plan Review skin check, don/doff of prosthetic leg, skin care. Work on ambulation, HEP, issue HEP with hip extension (supine off EOB, and prone on elbows)    PT Home Exercise Plan Verbally reviewed Hip extension in supine off edge of bed    Consulted and Agree with Plan of Care Patient             Patient will benefit from skilled therapeutic intervention in order to improve the following deficits and impairments:  Abnormal gait, Decreased activity tolerance, Decreased balance, Decreased endurance, Decreased knowledge of precautions, Decreased mobility, Decreased range of motion, Decreased safety awareness, Difficulty walking, Decreased strength, Increased edema, Increased fascial restricitons, Impaired flexibility, Improper body mechanics, Postural dysfunction, Pain, Prosthetic Dependency, Impaired sensation  Visit Diagnosis: Other abnormalities of gait and mobility  Muscle  weakness (generalized)  Hx of AKA (above knee amputation), left (HCC)  Left above-knee amputee [facility])     Problem List Patient Active Problem List   Diagnosis Date Noted   Acute pulmonary edema (HCC)    Acute on chronic diastolic (congestive) heart failure (HCC) 12/08/2020   Acute on chronic diastolic CHF (congestive heart failure) (HCC) 12/07/2020   Paroxysmal A-fib (HCC) 10/10/2020   Gastritis 10/10/2020   Dyspepsia 10/07/2020   CKD stage G2/A2, GFR 60-89 and albumin creatinine ratio 30-299 mg/g 10/07/2020   Atypical chest pain 10/06/2020   Acute blood loss anemia    Essential hypertension    Postoperative pain    Severe anemia 09/19/2020   History of left above knee amputation (HCC) 09/19/2020   Left above-knee amputee (HCC) 09/19/2020   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)    CKD (chronic kidney disease), stage III (HCC) 09/01/2020   Morbid obesity (HCC) 09/01/2020   Severe sepsis with acute organ dysfunction due to Streptococcus species (HCC) 08/30/2020   Cellulitis of left leg 08/30/2020   Lactic acidosis 08/30/2020   Elevated troponin 08/30/2020   Hypoalbuminemia 08/30/2020   Leukocytosis 08/30/2020   Thrombocytopenia (HCC) 08/30/2020   Hypokalemia 08/30/2020   Hyperglycemia 08/30/2020   Transaminitis 08/30/2020   Total bilirubin, elevated 08/30/2020   AKI (acute kidney injury) (HCC) 08/30/2020   Nausea vomiting and diarrhea 08/30/2020   Hypertension 05/20/2020   Persistent atrial fibrillation (HCC)     Ileana Ladd, PT 03/13/2021, 3:56 PM  [facility] [address] [place], [place], [address] Phone: [phone]   Fax:  [phone]  Name: Connor [name] MRN: [id number] Date of Birth: 1956/01/08

## 2021-03-21 ENCOUNTER — Other Ambulatory Visit: Payer: Self-pay

## 2021-03-21 ENCOUNTER — Ambulatory Visit: Payer: Medicaid Other

## 2021-03-21 DIAGNOSIS — M6281 Muscle weakness (generalized): Secondary | ICD-10-CM

## 2021-03-21 DIAGNOSIS — R2689 Other abnormalities of gait and mobility: Secondary | ICD-10-CM

## 2021-03-21 DIAGNOSIS — Z89612 Acquired absence of left leg above knee: Secondary | ICD-10-CM

## 2021-03-21 NOTE — Therapy (Signed)
[facility] [address] [place], [place], [address] Phone: [phone]   Fax:  [phone]  Physical Therapy Treatment  Patient Details  Name: Connor [name] MRN: [id number] Date of Birth: [birth year] Referring Provider (PT): Dr. Lajoyce Corners   Encounter Date: 03/21/2021   PT End of Session - 03/21/21 1115     Visit Number 3    Number of Visits 4    Date for PT Re-Evaluation 05/10/21    Authorization Type Medicaid    Authorization Time Period 03/01/21 to 05/10/21; 27 visits Max, 4 visits for first month then request more    Authorization - Visit Number 3    Authorization - Number of Visits 4    Progress Note Due on Visit 4    PT Start Time 1045    PT Stop Time 1130    PT Time Calculation (min) 45 min    Equipment Utilized During Treatment Gait belt    Activity Tolerance Patient tolerated treatment well    Behavior During Therapy WFL for tasks assessed/performed             Past Medical History:  Diagnosis Date   AAA (abdominal aortic aneurysm) (HCC)    History of open heart surgery    Hypertension    Seroma due to trauma [facility])     Past Surgical History:  Procedure Laterality Date   ABDOMINAL AORTIC ANEURYSM REPAIR     AMPUTATION Left 09/07/2020   Procedure: ATTEMPTED LEFT LEG DEBRIDEMENT FASCIOTOMIES, APPLY INSTILLATION WOUND VAC, ABOVE KNEE AMPUTATION;  Surgeon: Nadara Mustard, MD;  Location: [facility] OR;  Service: Orthopedics;  Laterality: Left;   BUBBLE STUDY  09/05/2020   Procedure: BUBBLE STUDY;  Surgeon: Meriam Sprague, MD;  Location: [facility] ENDOSCOPY;  Service: Cardiovascular;;   CARDIOVERSION N/A 02/11/2020   Procedure: CARDIOVERSION;  Surgeon: Little Ishikawa, MD;  Location: [facility] ENDOSCOPY;  Service: Cardiovascular;  Laterality: N/A;   CORONARY ARTERY BYPASS GRAFT     STUMP REVISION Left 09/09/2020   Procedure: REVISION LEFT ABOVE KNEE AMPUTATION;  Surgeon: Nadara Mustard, MD;  Location: [facility] OR;  Service:  Orthopedics;  Laterality: Left;   TEE WITHOUT CARDIOVERSION N/A 09/05/2020   Procedure: TRANSESOPHAGEAL ECHOCARDIOGRAM (TEE);  Surgeon: Meriam Sprague, MD;  Location: [facility] ENDOSCOPY;  Service: Cardiovascular;  Laterality: N/A;    There were no vitals filed for this visit.   Subjective Assessment - 03/21/21 1117     Subjective Pt reporting of his R leg bothering him. There is some disolocation that he thinks is getting worst. He thinks this is happeingn since the surgery.    Pertinent History L AKA 09/09/20 with Dr. Lajoyce Corners, Received prosthetic leg in 12/2020; PMHx HTN, CHF with EF 60-65%, CAD s/p CABG, CKD stage II, Diabetes with hx of L AKA,    Limitations Standing;Walking;House hold activities;Lifting    How long can you sit comfortably? no issues    How long can you stand comfortably? <5 min    How long can you walk comfortably? <5 min    Patient Stated Goals Improve walking    Pain Onset More than a month ago               Pt has dark skin disolocation in inner thigh and inner calf. Skin is harder over discolored area. Pt educated to contact PCP or go to urgent care to get it looked at. Patient reported that he is concerned about blood clots so he increased his blood  thinners to 3 pills a day to 2 pills a day. Pt educated that he should not increase his blood thinner without consulting his PCP as it can have adverse reactions. Pt verbalized undersntanding.  Soft tissue massage to L quads, hip flexors Manually stretched L hip flexors Foam roll massage to L IT band and gluts Prone lumbar extensions: 2 x 10 Prone hip extensions: 3 x 10 R and L   Gait training: 1 x 345' with RW, required 2 standing rest breaks, cues for tupright posture, improving weight shift to L LE                        PT Short Term Goals - 03/01/21 1221       PT SHORT TERM GOAL #1   Title Pt will verbalize on how to properly clean components of prosthetic leg to maintain safe and  sanitary environment for residual limb during wearing of prosthetic leg    Baseline Educated    Time 4    Period Weeks    Status New    Target Date 03/29/21      PT SHORT TERM GOAL #2   Title Pt will perform skin check twice a day to monitor for any skin break downs from prosthetic leg.    Baseline Educated    Time 4    Period Weeks    Status New    Target Date 03/29/21      PT SHORT TERM GOAL #3   Title Pt will carry extra pairs of socks and properly adjust for congruent fit of residula leg in his prosthetic leg to accomodate for fluid loss in residula leg.    Baseline Educated    Time 4    Period Weeks    Status New    Target Date 03/29/21               PT Long Term Goals - 03/01/21 1224       PT LONG TERM GOAL #1   Title Patient will demo Timed up and go test score of <30 seconds with RW to improve functional mobility    Baseline 60 sec (03/01/21)    Time 10    Period Weeks    Status New    Target Date 05/10/21      PT LONG TERM GOAL #2   Title Pt will be able to ambulate 230 feet with RW and SBA to improve walking endurance    Baseline 115' with RW    Time 6    Period Weeks    Status New    Target Date 04/12/21      PT LONG TERM GOAL #3   Title Pt will be able to ambulate >500 feet with RW and SBA to improve community walking endurance    Baseline 115' RW and SBA (03/01/21)    Time 10    Period Weeks    Status New    Target Date 05/10/21      PT LONG TERM GOAL #4   Title Pt will primarily use his RW for inhome and community mobility to improve independence.    Baseline Uses wheelchair mostly and RW (intermittently) for in home mobility    Time 10    Period Weeks    Status New    Target Date 05/10/21                   Plan - 03/21/21 1120  Clinical Impression Statement Today's skilled session was focused on improving flexibility in L hip and lumbar spine to improve upright posture and improve hip extension during L stance phase to  improve WB through forefoot to improve L prosthetic knee flexion. Patient demo improved gait endurance compared to last session.    Personal Factors and Comorbidities Age;Comorbidity 3+;Past/Current Experience;Time since onset of injury/illness/exacerbation;Transportation    Comorbidities MHx HTN, CHF with EF 60-65%, CAD s/p CABG, CKD stage II, Diabetes with hx of L AKA, Anemia, A fib on Eliquis, and hx of abdominal aortic aneurysm repair   09/09/20 revision left AKA    Examination-Activity Limitations Bathing;Bed Mobility;Bend;Caring for Others;Carry;Hygiene/Grooming;Locomotion Level;Squat;Stairs;Stand;Toileting;Transfers    Examination-Participation Restrictions Church;Cleaning;Community Activity;Driving;Laundry;Meal Prep;Yard Work;Shop    Stability/Clinical Decision Making Stable/Uncomplicated    Rehab Potential Good    PT Frequency --   1-2x/week   PT Duration Other (comment)   10 weeeks   PT Treatment/Interventions ADLs/Self Care Home Management;Cryotherapy;Electrical Stimulation;Moist Heat;Gait training;Stair training;Functional mobility training;Therapeutic activities;Therapeutic exercise;Balance training;Manual techniques;Wheelchair mobility training;Prosthetic Training;Patient/family education;Neuromuscular re-education;Scar mobilization;Passive range of motion;Energy conservation;Joint Manipulations    PT Next Visit Plan Review skin check, don/doff of prosthetic leg, skin care. Work on ambulation, HEP, issue HEP with hip extension (supine off EOB, and prone on elbows)    PT Home Exercise Plan Verbally reviewed Hip extension in supine off edge of bed    Consulted and Agree with Plan of Care Patient             Patient will benefit from skilled therapeutic intervention in order to improve the following deficits and impairments:  Abnormal gait, Decreased activity tolerance, Decreased balance, Decreased endurance, Decreased knowledge of precautions, Decreased mobility, Decreased range of  motion, Decreased safety awareness, Difficulty walking, Decreased strength, Increased edema, Increased fascial restricitons, Impaired flexibility, Improper body mechanics, Postural dysfunction, Pain, Prosthetic Dependency, Impaired sensation  Visit Diagnosis: Other abnormalities of gait and mobility  Muscle weakness (generalized)  Hx of AKA (above knee amputation), left (HCC)  Left above-knee amputee [facility])     Problem List Patient Active Problem List   Diagnosis Date Noted   Acute pulmonary edema (HCC)    Acute on chronic diastolic (congestive) heart failure (HCC) 12/08/2020   Acute on chronic diastolic CHF (congestive heart failure) (HCC) 12/07/2020   Paroxysmal A-fib (HCC) 10/10/2020   Gastritis 10/10/2020   Dyspepsia 10/07/2020   CKD stage G2/A2, GFR 60-89 and albumin creatinine ratio 30-299 mg/g 10/07/2020   Atypical chest pain 10/06/2020   Acute blood loss anemia    Essential hypertension    Postoperative pain    Severe anemia 09/19/2020   History of left above knee amputation (HCC) 09/19/2020   Left above-knee amputee (HCC) 09/19/2020   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)    CKD (chronic kidney disease), stage III (HCC) 09/01/2020   Morbid obesity (HCC) 09/01/2020   Severe sepsis with acute organ dysfunction due to Streptococcus species (HCC) 08/30/2020   Cellulitis of left leg 08/30/2020   Lactic acidosis 08/30/2020   Elevated troponin 08/30/2020   Hypoalbuminemia 08/30/2020   Leukocytosis 08/30/2020   Thrombocytopenia (HCC) 08/30/2020   Hypokalemia 08/30/2020   Hyperglycemia 08/30/2020   Transaminitis 08/30/2020   Total bilirubin, elevated 08/30/2020   AKI (acute kidney injury) (HCC) 08/30/2020   Nausea vomiting and diarrhea 08/30/2020   Hypertension 05/20/2020   Persistent atrial fibrillation (HCC)     Ileana Ladd, PT 03/21/2021, 1:21 PM  [facility] [address] [place],  [place], [id number] Phone: [phone]   Fax:  [phone]  Name: Connor SequinRobert [name] MRN: [id number] Date of Birth: Mar 24, 1956

## 2021-03-25 ENCOUNTER — Other Ambulatory Visit: Payer: Self-pay

## 2021-03-25 ENCOUNTER — Emergency Department (HOSPITAL_COMMUNITY)
Admission: EM | Admit: 2021-03-25 | Discharge: 2021-03-25 | Disposition: A | Discharge status: Home / Self Care | Payer: Medicaid Other | Attending: Emergency Medicine | Admitting: Emergency Medicine

## 2021-03-25 ENCOUNTER — Ambulatory Visit
Admission: EM | Admit: 2021-03-25 | Discharge: 2021-03-25 | Disposition: A | Discharge status: Home / Self Care | Payer: Medicaid Other

## 2021-03-25 ENCOUNTER — Encounter (HOSPITAL_COMMUNITY): Payer: Self-pay | Admitting: *Deleted

## 2021-03-25 DIAGNOSIS — N183 Chronic kidney disease, stage 3 unspecified: Secondary | ICD-10-CM | POA: Diagnosis not present

## 2021-03-25 DIAGNOSIS — Z79899 Other long term (current) drug therapy: Secondary | ICD-10-CM | POA: Diagnosis not present

## 2021-03-25 DIAGNOSIS — R600 Localized edema: Secondary | ICD-10-CM | POA: Insufficient documentation

## 2021-03-25 DIAGNOSIS — F17210 Nicotine dependence, cigarettes, uncomplicated: Secondary | ICD-10-CM | POA: Insufficient documentation

## 2021-03-25 DIAGNOSIS — M7989 Other specified soft tissue disorders: Secondary | ICD-10-CM | POA: Diagnosis present

## 2021-03-25 DIAGNOSIS — I130 Hypertensive heart and chronic kidney disease with heart failure and stage 1 through stage 4 chronic kidney disease, or unspecified chronic kidney disease: Secondary | ICD-10-CM | POA: Diagnosis not present

## 2021-03-25 DIAGNOSIS — Z951 Presence of aortocoronary bypass graft: Secondary | ICD-10-CM | POA: Diagnosis not present

## 2021-03-25 DIAGNOSIS — Z7901 Long term (current) use of anticoagulants: Secondary | ICD-10-CM | POA: Diagnosis not present

## 2021-03-25 DIAGNOSIS — Z7982 Long term (current) use of aspirin: Secondary | ICD-10-CM | POA: Diagnosis not present

## 2021-03-25 DIAGNOSIS — I5033 Acute on chronic diastolic (congestive) heart failure: Secondary | ICD-10-CM | POA: Insufficient documentation

## 2021-03-25 NOTE — ED Notes (Addendum)
Pt refusing IV at this time.  Says he was thinking that he just needed labs.  Pt is left AKA.

## 2021-03-25 NOTE — ED Triage Notes (Signed)
Pt concerned for blood clot, provider made aware . Pt to go to ED no provider visit made

## 2021-03-25 NOTE — ED Triage Notes (Signed)
Pt c/o swelling to right leg x one month that continues to get worse

## 2021-03-25 NOTE — ED Provider Notes (Signed)
[facility] EMERGENCY DEPARTMENT Provider Note   CSN: [id number] Arrival date & time: 03/25/21  1238     History Chief Complaint  Patient presents with   Leg Swelling    X one month    Connor [name] is a 66 y.o. male with a past medical history of AAA, A. fib, CKD, CHF, left AKA currently on Eliquis presenting to the ED with a chief complaint of right leg swelling.  He has noticed over the past 1 to 2 months that his right lower extremity edema has worsened.  Reports associated pain that is worse with movement.  He has been compliant with his Eliquis.  He denies any chest pain, shortness of breath, numbness, joint pain, erythema.  He would like to be checked for a DVT and was sent from urgent care for this  HPI     Past Medical History:  Diagnosis Date   AAA (abdominal aortic aneurysm) (HCC)    History of open heart surgery    Hypertension    Seroma due to trauma [facility])     Patient Active Problem List   Diagnosis Date Noted   Acute pulmonary edema (HCC)    Acute on chronic diastolic (congestive) heart failure (HCC) 12/08/2020   Acute on chronic diastolic CHF (congestive heart failure) (HCC) 12/07/2020   Paroxysmal A-fib (HCC) 10/10/2020   Gastritis 10/10/2020   Dyspepsia 10/07/2020   CKD stage G2/A2, GFR 60-89 and albumin creatinine ratio 30-299 mg/g 10/07/2020   Atypical chest pain 10/06/2020   Acute blood loss anemia    Essential hypertension    Postoperative pain    Severe anemia 09/19/2020   History of left above knee amputation (HCC) 09/19/2020   Left above-knee amputee (HCC) 09/19/2020   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)    CKD (chronic kidney disease), stage III (HCC) 09/01/2020   Morbid obesity (HCC) 09/01/2020   Severe sepsis with acute organ dysfunction due to Streptococcus species (HCC) 08/30/2020   Cellulitis of left leg 08/30/2020   Lactic acidosis 08/30/2020   Elevated troponin 08/30/2020   Hypoalbuminemia 08/30/2020    Leukocytosis 08/30/2020   Thrombocytopenia (HCC) 08/30/2020   Hypokalemia 08/30/2020   Hyperglycemia 08/30/2020   Transaminitis 08/30/2020   Total bilirubin, elevated 08/30/2020   AKI (acute kidney injury) (HCC) 08/30/2020   Nausea vomiting and diarrhea 08/30/2020   Hypertension 05/20/2020   Persistent atrial fibrillation (HCC)     Past Surgical History:  Procedure Laterality Date   ABDOMINAL AORTIC ANEURYSM REPAIR     AMPUTATION Left 09/07/2020   Procedure: ATTEMPTED LEFT LEG DEBRIDEMENT FASCIOTOMIES, APPLY INSTILLATION WOUND VAC, ABOVE KNEE AMPUTATION;  Surgeon: Nadara Mustard, MD;  Location: [facility] OR;  Service: Orthopedics;  Laterality: Left;   BUBBLE STUDY  09/05/2020   Procedure: BUBBLE STUDY;  Surgeon: Meriam Sprague, MD;  Location: [facility] ENDOSCOPY;  Service: Cardiovascular;;   CARDIOVERSION N/A 02/11/2020   Procedure: CARDIOVERSION;  Surgeon: Little Ishikawa, MD;  Location: [facility] ENDOSCOPY;  Service: Cardiovascular;  Laterality: N/A;   CORONARY ARTERY BYPASS GRAFT     STUMP REVISION Left 09/09/2020   Procedure: REVISION LEFT ABOVE KNEE AMPUTATION;  Surgeon: Nadara Mustard, MD;  Location: [facility] OR;  Service: Orthopedics;  Laterality: Left;   TEE WITHOUT CARDIOVERSION N/A 09/05/2020   Procedure: TRANSESOPHAGEAL ECHOCARDIOGRAM (TEE);  Surgeon: Meriam Sprague, MD;  Location: [facility] ENDOSCOPY;  Service: Cardiovascular;  Laterality: N/A;       Family History  Problem Relation Age of Onset   CAD  Mother    CVA Father     Social History   Tobacco Use   Smoking status: Every Day    Packs/day: 0.50    Types: Cigarettes   Smokeless tobacco: Never  Vaping Use   Vaping Use: Never used  Substance Use Topics   Alcohol use: Never   Drug use: Never    Home Medications Prior to Admission medications   Medication Sig Start Date End Date Taking? Authorizing Provider  acetaminophen (TYLENOL) 325 MG tablet Take 2 tablets (650 mg total) by mouth every 6 (six) hours as needed for  moderate pain. 10/10/20   Arrien, York Ram, MD  albuterol (VENTOLIN HFA) 108 (90 Base) MCG/ACT inhaler Inhale 2 puffs into the lungs every 6 (six) hours as needed for wheezing or shortness of breath. Patient not taking: Reported on 02/04/2021    [provider]  amoxicillin (AMOXIL) 500 MG capsule Take 1,000 mg by mouth 2 (two) times daily. 12/27/20   [provider]  aspirin EC 81 MG tablet Take 81 mg by mouth daily. Swallow whole.    [provider]  diclofenac Sodium (VOLTAREN) 1 % GEL APPLY 2 G TOPICALLY FOUR TIMES DAILY. 09/29/20 09/29/21  Angiulli, Mcarthur Rossetti, PA-C  diltiazem (CARDIZEM CD) 240 MG 24 hr capsule TAKE 1 CAPSULE (240 MG TOTAL) BY MOUTH DAILY. 09/29/20 09/29/21  Angiulli, Mcarthur Rossetti, PA-C  diltiazem (DILACOR XR) 240 MG 24 hr capsule Take 240 mg by mouth daily. Patient not taking: Reported on 02/04/2021 01/17/21   [provider]  ELIQUIS 5 MG TABS tablet TAKE 1 TABLET (5 MG TOTAL) BY MOUTH 2 (TWO) TIMES DAILY. 09/29/20 09/29/21  Angiulli, Mcarthur Rossetti, PA-C  furosemide (LASIX) 40 MG tablet Take 1 tablet (40 mg total) by mouth daily. Take once daily for swelling or shortness of breath. 12/09/20   Vassie Loll, MD  gabapentin (NEURONTIN) 400 MG capsule TAKE 1 CAPSULE (400 MG TOTAL) BY MOUTH THREE TIMES DAILY. 09/29/20 09/29/21  Angiulli, Mcarthur Rossetti, PA-C  hydrALAZINE (APRESOLINE) 25 MG tablet Take 1 tablet (25 mg total) by mouth every 8 (eight) hours. 12/09/20   Vassie Loll, MD  HYDROcodone-acetaminophen (NORCO/VICODIN) 5-325 MG tablet Take 1 tablet by mouth 3 (three) times daily as needed. 12/27/20   [provider]  isosorbide mononitrate (IMDUR) 30 MG 24 hr tablet Take 1 tablet (30 mg total) by mouth daily. 12/10/20   Vassie Loll, MD  labetalol (NORMODYNE) 200 MG tablet TAKE 2 TABLETS (400 MG TOTAL) BY MOUTH TWO TIMES DAILY. 09/29/20 09/29/21  Angiulli, Mcarthur Rossetti, PA-C  lisinopril (ZESTRIL) 10 MG tablet Take 2 tablets (20 mg total) by mouth daily. Patient not  taking: Reported on 02/04/2021 12/10/20   Vassie Loll, MD  lisinopril (ZESTRIL) 20 MG tablet Take 1 tablet by mouth daily. 01/17/21   [provider]  Multiple Vitamins-Minerals (MULTIVITAMIN WITH MINERALS) tablet Take 1 tablet by mouth daily. Men    [provider]  ondansetron (ZOFRAN) 4 MG tablet Take 1 tablet (4 mg total) by mouth every 8 (eight) hours as needed for nausea or vomiting. Patient not taking: No sig reported 10/27/20   Durward Parcel, FNP  oxyCODONE (OXY IR/ROXICODONE) 5 MG immediate release tablet Take 1 tablet (5 mg total) by mouth every 6 (six) hours as needed for severe pain. Patient not taking: Reported on 02/04/2021 10/24/20   Persons, [place], PA  pantoprazole (PROTONIX) 40 MG tablet Take 1 tablet (40 mg total) by mouth daily for 14 days. Patient not  taking: Reported on 02/04/2021 10/10/20 10/24/20  Arrien, York Ram, MD  potassium chloride SA (KLOR-CON) 20 MEQ tablet Take 20 mEq by mouth daily. 01/17/21   [provider]  spironolactone (ALDACTONE) 25 MG tablet Take 25 mg by mouth daily.    [provider]  traZODone (DESYREL) 100 MG tablet Take 100 mg by mouth at bedtime. 11/28/20   [provider]    Allergies    Patient has no known allergies.  Review of Systems   Review of Systems  Constitutional:  Negative for appetite change, chills and fever.  HENT:  Negative for ear pain, rhinorrhea, sneezing and sore throat.   Eyes:  Negative for photophobia and visual disturbance.  Respiratory:  Negative for cough, chest tightness, shortness of breath and wheezing.   Cardiovascular:  Positive for leg swelling. Negative for chest pain and palpitations.  Gastrointestinal:  Negative for abdominal pain, blood in stool, constipation, diarrhea, nausea and vomiting.  Genitourinary:  Negative for dysuria, hematuria and urgency.  Musculoskeletal:  Negative for myalgias.  Skin:  Negative for rash.  Neurological:  Negative for  dizziness, weakness and light-headedness.   Physical Exam Updated Vital Signs BP 135/84 (BP Location: Right Arm)   Pulse 62   Temp 98.2 F (36.8 C) (Oral)   Resp 18   Ht 5\' 11"  (1.803 m)   Wt 109.3 kg   SpO2 99%   BMI 33.61 kg/m   Physical Exam Vitals and nursing note reviewed.  Constitutional:      General: He is not in acute distress.    Appearance: He is well-developed.  HENT:     Head: Normocephalic and atraumatic.     Nose: Nose normal.  Eyes:     General: No scleral icterus.       Left eye: No discharge.     Conjunctiva/sclera: Conjunctivae normal.  Cardiovascular:     Rate and Rhythm: Normal rate and regular rhythm.     Heart sounds: Normal heart sounds. No murmur heard.   No friction rub. No gallop.  Pulmonary:     Effort: Pulmonary effort is normal. No respiratory distress.     Breath sounds: Normal breath sounds.  Abdominal:     General: Bowel sounds are normal. There is no distension.     Palpations: Abdomen is soft.     Tenderness: There is no abdominal tenderness. There is no guarding.  Musculoskeletal:        General: Normal range of motion.     Cervical back: Normal range of motion and neck supple.  Skin:    General: Skin is warm and dry.     Findings: No rash.     Comments: Very slight, nonpitting edema noted to right lower extremity.  2+ DP pulse palpated.  Normal sensation to light touch right lower extremity.  Left AKA noted.  Strength 5/5 in right lower extremity.  No overlying skin changes.  No erythema or warmth noted.  No calf tenderness.  Neurological:     Mental Status: He is alert.     Motor: No abnormal muscle tone.     Coordination: Coordination normal.    ED Results / Procedures / Treatments   Labs (all labs ordered are listed, but only abnormal results are displayed) Labs Reviewed - No data to display  EKG None  Radiology No results found.  Procedures Procedures   Medications Ordered in ED Medications - No data to  display  ED Course  I have reviewed the triage  vital signs and the nursing notes.  Pertinent labs & imaging results that were available during my care of the patient were reviewed by me and considered in my medical decision making (see chart for details).    MDM Rules/Calculators/A&P                           65 year old male with a past medical history of AAA, CKD, CHF, A. fib, currently on Eliquis presenting to the ED for right lower extremity edema.  Has been gradually worsening over the past 1 to 2 months.  No chest pain, shortness of breath, hemoptysis.  He has been compliant with his Eliquis.  On exam there is very slight nonpitting edema to right lower extremity.  Intact distal pulse noted on right side.  His left AKA.  No overlying skin changes, warmth or discoloration noted.  Normal range of motion of joints.  Unfortunately vascular ultrasound is not here at this time.  We will have him return in the morning to have the study completed.  Low suspicion for PE as he is denying any respiratory complaints, chest pain, he is not hypoxic or tachycardic and he has been compliant with his Eliquis.  He agrees to return in the morning and follow-up with PCP.  An After Visit Summary was printed and given to the patient.   Portions of this note were generated with Scientist, clinical (histocompatibility and immunogenetics)Dragon dictation software. Dictation errors may occur despite best attempts at proofreading.  Final Clinical Impression(s) / ED Diagnoses Final diagnoses:  Right leg swelling    Rx / DC Orders ED Discharge Orders          Ordered    US Venous Img Lower Unilateral Right        03/25/21 [address]1421             Sharma Lawrance, PA-C 03/25/21 1431    Bethann BerkshireZammit, Joseph, MD 03/27/21 [phone]

## 2021-03-25 NOTE — ED Notes (Signed)
Pt given urinal upon request 

## 2021-03-25 NOTE — Discharge Instructions (Addendum)
You will need to return in the morning between 9 AM and 12 noon to have your DVT study completed.  Follow-up with your primary care provider. Return to the ER if you start to experience worsening pain, chest pain, weakness, injuries or falls, shortness of breath.

## 2021-03-26 ENCOUNTER — Ambulatory Visit (HOSPITAL_COMMUNITY)
Admission: RE | Admit: 2021-03-26 | Discharge: 2021-03-26 | Disposition: A | Discharge status: Home / Self Care | Payer: Medicaid Other | Source: Ambulatory Visit | Attending: Physician Assistant | Admitting: Physician Assistant

## 2021-03-26 DIAGNOSIS — R600 Localized edema: Secondary | ICD-10-CM | POA: Insufficient documentation

## 2021-03-26 NOTE — ED Provider Notes (Signed)
Patient return for Doppler results of the right lower extremity.  CLINICAL DATA:  Right lower extremity edema for several months.    FINDINGS: VENOUS   Normal compressibility of the common femoral, superficial femoral, and popliteal veins, as well as the visualized calf veins. Visualized portions of profunda femoral vein and great saphenous vein unremarkable. No filling defects to suggest DVT on grayscale or color Doppler imaging. Doppler waveforms show normal direction of venous flow, normal respiratory plasticity and response to augmentation.   Limited views of the contralateral common femoral vein are unremarkable.   OTHER   None.   Limitations: none   IMPRESSION: Negative.  Patient informed of results, stated understanding and plans to follow-up with his primary care provider.  No other complaints or concerns.  Patient informed that he may return to the ER anytime for any new or worsening symptoms.  Note: Portions of this report may have been transcribed using voice recognition software. Every effort was made to ensure accuracy; however, inadvertent computerized transcription errors may still be present.    Elizabeth Palau 03/26/21 1116    Bethann Berkshire, MD 03/27/21 [phone]

## 2021-03-28 ENCOUNTER — Ambulatory Visit (HOSPITAL_BASED_OUTPATIENT_CLINIC_OR_DEPARTMENT_OTHER): Payer: Medicaid Other | Admitting: Family

## 2021-03-28 ENCOUNTER — Encounter: Payer: Self-pay | Admitting: Physical Therapy

## 2021-03-28 ENCOUNTER — Other Ambulatory Visit: Payer: Self-pay

## 2021-03-28 ENCOUNTER — Ambulatory Visit: Payer: Medicaid Other | Attending: Orthopedic Surgery | Admitting: Physical Therapy

## 2021-03-28 DIAGNOSIS — Z89612 Acquired absence of left leg above knee: Secondary | ICD-10-CM | POA: Diagnosis present

## 2021-03-28 DIAGNOSIS — M6281 Muscle weakness (generalized): Secondary | ICD-10-CM

## 2021-03-28 DIAGNOSIS — R2689 Other abnormalities of gait and mobility: Secondary | ICD-10-CM | POA: Insufficient documentation

## 2021-03-28 NOTE — Therapy (Signed)
[facility] [address] [place], [place], [address] Phone: [phone]   Fax:  [phone]  Physical Therapy Treatment  Patient Details  Name: Connor [name] MRN: [id number] Date of Birth: October 10, 1955 Referring Provider (PT): Dr. Sharol Given   Encounter Date: 03/28/2021   PT End of Session - 03/28/21 1108     Visit Number 4    Number of Visits 18    Date for PT Re-Evaluation 05/10/21    Authorization Type Medicaid    Authorization Time Period 03/01/21 to 05/10/21; 27 visits Max, 4 visits for first month then request more    Authorization - Visit Number 4    Authorization - Number of Visits 4    Progress Note Due on Visit 4    PT Start Time 1102    PT Stop Time 1145    PT Time Calculation (min) 43 min    Equipment Utilized During Treatment Gait belt    Activity Tolerance Patient tolerated treatment well    Behavior During Therapy WFL for tasks assessed/performed             Past Medical History:  Diagnosis Date   AAA (abdominal aortic aneurysm) ([place])    History of open heart surgery    Hypertension    Seroma due to trauma [facility])     Past Surgical History:  Procedure Laterality Date   ABDOMINAL AORTIC ANEURYSM REPAIR     AMPUTATION Left 09/07/2020   Procedure: ATTEMPTED LEFT LEG DEBRIDEMENT FASCIOTOMIES, APPLY INSTILLATION WOUND VAC, ABOVE KNEE AMPUTATION;  Surgeon: Newt Minion, MD;  Location: [place];  Service: Orthopedics;  Laterality: Left;   BUBBLE STUDY  09/05/2020   Procedure: BUBBLE STUDY;  Surgeon: Freada Bergeron, MD;  Location: [place];  Service: Cardiovascular;;   CARDIOVERSION N/A 02/11/2020   Procedure: CARDIOVERSION;  Surgeon: Donato Heinz, MD;  Location: [place];  Service: Cardiovascular;  Laterality: N/A;   CORONARY ARTERY BYPASS GRAFT     STUMP REVISION Left 09/09/2020   Procedure: REVISION LEFT ABOVE KNEE AMPUTATION;  Surgeon: Newt Minion, MD;  Location: [place];  Service:  Orthopedics;  Laterality: Left;   TEE WITHOUT CARDIOVERSION N/A 09/05/2020   Procedure: TRANSESOPHAGEAL ECHOCARDIOGRAM (TEE);  Surgeon: Freada Bergeron, MD;  Location: [facility] ENDOSCOPY;  Service: Cardiovascular;  Laterality: N/A;    There were no vitals filed for this visit.   Subjective Assessment - 03/28/21 1106     Subjective Was seen in ED for right LE, DVT ruled out. Was told to see his PCP, has an appt on 04/20/21.    Pertinent History L AKA 09/09/20 with Dr. Sharol Given, Received prosthetic leg in 12/2020; PMHx HTN, CHF with EF 60-65%, CAD s/p CABG, CKD stage II, Diabetes with hx of L AKA,    Limitations Standing;Walking;House hold activities;Lifting                  [facility] Adult PT Treatment/Exercise - 03/28/21 1109       Transfers   Transfers Sit to Stand;Stand to Sit    Sit to Stand 5: Supervision;With upper extremity assist;From chair/3-in-1;From bed    Sit to Stand Details Verbal cues for technique;Verbal cues for precautions/safety;Verbal cues for safe use of DME/AE    Sit to Stand Details (indicate cue type and reason) cues for hand placement, prosthetic foot placement and weight shifting to lock prosthetic knee upon standing.    Stand to Sit 5: Supervision;With upper extremity assist;To bed;To chair/3-in-1  Stand to Sit Details (indicate cue type and reason) Verbal cues for technique;Verbal cues for precautions/safety;Verbal cues for safe use of DME/AE    Stand to Sit Details cues for technique to unlock prosthetic knee prior to sitting down and to reach back vs keeping both hands on walker.      Ambulation/Gait   Ambulation/Gait Yes    Ambulation/Gait Assistance 5: Supervision    Ambulation/Gait Assistance Details cues needed to widen base of support for more normalized base of support, to decrease the velocity with which he advances prosthesis to avoid the knee locking out in the air and the buckling upon intiial contact to floor    Ambulation Distance (Feet) 230 Feet    x2   Assistive device Rolling walker;Prosthesis    Gait Pattern Step-through pattern;Decreased stride length;Decreased stance time - left;Decreased weight shift to left;Narrow base of support    Ambulation Surface Level;Indoor    Stairs Yes    Stairs Assistance 4: Min guard    Stair Management Technique One rail Right;Two rails;Step to pattern;Forwards;With crutches    Number of Stairs 4   x2 reps   Height of Stairs 6      Prosthetics   Prosthetic Care Comments  discussed importance of daily wear consistently for limb to used to liner/prosthesis for decreased discomfort with wear; pt show ice massage to distal limb to assist with pain and swelling.    Current prosthetic wear tolerance (days/week)  most every day, skips some days    Current prosthetic wear tolerance (#hours/day)  all awake hours    Donning Prosthesis Supervision    Doffing Prosthesis Supervision                      PT Short Term Goals - 03/28/21 1120       PT SHORT TERM GOAL #1   Title Pt will verbalize on how to properly clean components of prosthetic leg to maintain safe and sanitary environment for residual limb during wearing of prosthetic leg    Baseline 03/28/21: pt able to verbalize how to clean liner and inside of socket    Status Achieved    Target Date 03/29/21      PT SHORT TERM GOAL #2   Title Pt will perform skin check twice a day to monitor for any skin break downs from prosthetic leg.    Baseline 03/28/21    Status Achieved    Target Date 03/29/21      PT SHORT TERM GOAL #3   Title Pt will carry extra pairs of socks and properly adjust for congruent fit of residula leg in his prosthetic leg to accomodate for fluid loss in residula leg.    Baseline 03/28/21: pt is not wearing prosthesis outside of home at this time, so has socks with him at home.    Status Not Met    Target Date 03/29/21               PT Long Term Goals - 03/01/21 1224       PT LONG TERM GOAL #1   Title Patient  will demo Timed up and go test score of <30 seconds with RW to improve functional mobility    Baseline 60 sec (03/01/21)    Time 10    Period Weeks    Status New    Target Date 05/10/21      PT LONG TERM GOAL #2   Title Pt will be able to ambulate  230 feet with RW and SBA to improve walking endurance    Baseline 115' with RW    Time 6    Period Weeks    Status New    Target Date 04/12/21      PT LONG TERM GOAL #3   Title Pt will be able to ambulate >500 feet with RW and SBA to improve community walking endurance    Baseline 115' RW and SBA (03/01/21)    Time 10    Period Weeks    Status New    Target Date 05/10/21      PT LONG TERM GOAL #4   Title Pt will primarily use his RW for inhome and community mobility to improve independence.    Baseline Uses wheelchair mostly and RW (intermittently) for in home mobility    Time 10    Period Weeks    Status New    Target Date 05/10/21                   Plan - 03/28/21 1108     Clinical Impression Statement Today's skilled session conitnued to focus on limb care, prosthetic education and gait with prosthesis/RW. Cues needed for correct gait mechanics with gait in session today. Also initiated stair training this session with prosthesis and 2 rails, then rail/crutch combo as pt only has one rail at home. No issues noted or reported in session. The pt is progressing toward goals and should benefit from continued PT to progress toward unmet goals.    Personal Factors and Comorbidities Age;Comorbidity 3+;Past/Current Experience;Time since onset of injury/illness/exacerbation;Transportation    Comorbidities MHx HTN, CHF with EF 60-65%, CAD s/p CABG, CKD stage II, Diabetes with hx of L AKA, Anemia, A fib on Eliquis, and hx of abdominal aortic aneurysm repair   09/09/20 revision left AKA    Examination-Activity Limitations Bathing;Bed Mobility;Bend;Caring for Others;Carry;Hygiene/Grooming;Locomotion  Level;Squat;Stairs;Stand;Toileting;Transfers    Examination-Participation Restrictions Church;Cleaning;Community Activity;Driving;Laundry;Meal Prep;Yard Work;Shop    Stability/Clinical Decision Making Stable/Uncomplicated    Rehab Potential Good    PT Frequency --   1-2x/week   PT Duration Other (comment)   10 weeeks   PT Treatment/Interventions ADLs/Self Care Home Management;Cryotherapy;Electrical Stimulation;Moist Heat;Gait training;Stair training;Functional mobility training;Therapeutic activities;Therapeutic exercise;Balance training;Manual techniques;Wheelchair mobility training;Prosthetic Training;Patient/family education;Neuromuscular re-education;Scar mobilization;Passive range of motion;Energy conservation;Joint Manipulations    PT Next Visit Plan continue with gait training with prosthesis, stairs, ramp/curb    PT Home Exercise Plan Verbally reviewed Hip extension in supine off edge of bed    Consulted and Agree with Plan of Care Patient             Patient will benefit from skilled therapeutic intervention in order to improve the following deficits and impairments:  Abnormal gait, Decreased activity tolerance, Decreased balance, Decreased endurance, Decreased knowledge of precautions, Decreased mobility, Decreased range of motion, Decreased safety awareness, Difficulty walking, Decreased strength, Increased edema, Increased fascial restricitons, Impaired flexibility, Improper body mechanics, Postural dysfunction, Pain, Prosthetic Dependency, Impaired sensation  Visit Diagnosis: Other abnormalities of gait and mobility  Muscle weakness (generalized)     Problem List Patient Active Problem List   Diagnosis Date Noted   Acute pulmonary edema (HCC)    Acute on chronic diastolic (congestive) heart failure ([place]) 12/08/2020   Acute on chronic diastolic CHF (congestive heart failure) ([place]) 12/07/2020   Paroxysmal A-fib (HCC) 10/10/2020   Gastritis 10/10/2020   Dyspepsia  10/07/2020   CKD stage G2/A2, GFR 60-89 and albumin creatinine ratio 30-299 mg/g 10/07/2020  Atypical chest pain 10/06/2020   Acute blood loss anemia    Essential hypertension    Postoperative pain    Severe anemia 09/19/2020   History of left above knee amputation ([place]) 09/19/2020   Left above-knee amputee ([place]) 09/19/2020   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 ([place])    CKD (chronic kidney disease), stage III ([place]) 09/01/2020   Morbid obesity ([place]) 09/01/2020   Severe sepsis with acute organ dysfunction due to Streptococcus species ([place]) 08/30/2020   Cellulitis of left leg 08/30/2020   Lactic acidosis 08/30/2020   Elevated troponin 08/30/2020   Hypoalbuminemia 08/30/2020   Leukocytosis 08/30/2020   Thrombocytopenia ([place]) 08/30/2020   Hypokalemia 08/30/2020   Hyperglycemia 08/30/2020   Transaminitis 08/30/2020   Total bilirubin, elevated 08/30/2020   AKI (acute kidney injury) ([place]) 08/30/2020   Nausea vomiting and diarrhea 08/30/2020   Hypertension 05/20/2020   Persistent atrial fibrillation ([place])    Willow Ora, PTA, [place] [address] [place], [place] [address] [phone] 03/28/21, 2:58 PM   Name: Connor [name] MRN: [id number] Date of Birth: 1956/02/15

## 2021-03-29 NOTE — Progress Notes (Signed)
Office Visit    Patient Name: Connor [name] Date of Encounter: 03/31/2021  PCP:  Oneal Grout, FNP   [facility]  Cardiologist:  Little Ishikawa, MD  Advanced Practice Provider:  No care team member to display Electrophysiologist:  None    Chief Complaint    Bernadette Armijo is a 65 y.o. 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 (2015 at [facility]) and recurrence at distal anastomosis s/p frozen elephant truck and total arch [facility]) and [facility]EVAR extension (01/20[facility] at [facility]), left renal artery and left common and external iliac stenting (08/2018)  presents today for hospital follow-up  Past Medical History    Past Medical History:  Diagnosis Date   AAA (abdominal aortic aneurysm) (HCC)    History of open heart surgery    Hypertension    Seroma due to trauma [facility])    Past Surgical History:  Procedure Laterality Date   ABDOMINAL AORTIC ANEURYSM REPAIR     AMPUTATION Left 09/07/2020   Procedure: ATTEMPTED LEFT LEG DEBRIDEMENT FASCIOTOMIES, APPLY INSTILLATION WOUND VAC, ABOVE KNEE AMPUTATION;  Surgeon: Nadara Mustard, MD;  [facility];  Service: Orthopedics;  Laterality: Left;   BUBBLE STUDY  09/05/2020   Procedure: BUBBLE STUDY;  Surgeon: Meriam Sprague, MD;  Location: [facility] ENDOSCOPY;  Service: Cardiovascular;;   CARDIOVERSION N/A 02/11/2020   Procedure: CARDIOVERSION;  Surgeon: Little Ishikawa, MD;  Location: [facility] ENDOSCOPY;  Service: Cardiovascular;  Laterality: N/A;   CORONARY ARTERY BYPASS GRAFT     STUMP REVISION Left 09/09/2020   Procedure: REVISION LEFT ABOVE KNEE AMPUTATION;  Surgeon: Nadara Mustard, MD;  Location: [facility] OR;  Service: Orthopedics;  Laterality: Left;   TEE WITHOUT CARDIOVERSION N/A 09/05/2020   Procedure: TRANSESOPHAGEAL ECHOCARDIOGRAM (TEE);  Surgeon: Meriam Sprague, MD;  Location: [facility] ENDOSCOPY;  Service: Cardiovascular;   Laterality: N/A;    Allergies  No Known Allergies  History of Present Illness    [name] is a 65 y.o. 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  He was last seen by Dr. Flora Lipps 04/29/20 with uncontrolled hypertension. He was referred for sleep study. Additionally referred to atrial fibrillation clinic. He was seen by pharmacist for management of hypertension 05/20/20. He was recommended for 1 month follow up and this was not completed.  He was admitted 02/04/21-02/13/21 at [facility] after presenting with type B dissection, chest pain, hypertensive crisis with imaging showing separation of left renal stent. On 02/06/21 underwent TEVAR extension and LRA stenting via bilateral CFA. He required postoperative diuresis. He had episode of chest pain and troponins were unremarkable. He was recommended to follow up with CT chest/abd/pelvis with contrast in 1 month with Dr. Coralee Rud. Endocrinology was consulted for hyperaldosteronism and Spironolactone initiated.   Seen in the ED 03/25/21 with RLE edema. Duplex with no evidence of DVT.   It does not appear he has seen any medical provider since hospital discharge from [facility].  Presents today for follow up. BP has been good when checked at home per his report.  Denies chest pain, pressure, tightness.  Reports no shortness of  breath at rest nor dyspnea on exertion.  Tells me his R LE edema has mostly resolved and he is just small amount of swelling still in his upper thigh.  He has been working with a new prosthesis for his left AKA and is pleased with his progress.  Does note that he needs 2 teeth pulled and has been working with dentist to get  clearance.  Discussed that he could have this clearance faxed to our office.  EKGs/Labs/Other Studies Reviewed:   The following studies were reviewed today:  TTE 10/02/19:  1. Left ventricular ejection fraction, by visual estimation, is 60 to  65%. The left ventricle has normal function. Left ventricular septal wall  thickness was severely increased. Severely increased left ventricular  posterior wall thickness. There is  severely increased left ventricular hypertrophy.   2. Left ventricular diastolic function could not be evaluated.   3. The left ventricle has no regional wall motion abnormalities.   4. Global right ventricle has normal systolic function.The right  ventricular size is normal. No increase in right ventricular wall  thickness.   5. Left atrial size was moderately dilated.   6. Right atrial size was normal.   7. The mitral valve is normal in structure. Trivial mitral valve  regurgitation. No evidence of mitral stenosis.   8. The tricuspid valve is normal in structure.   9. The tricuspid valve is normal in structure. Tricuspid valve  regurgitation is not demonstrated.  10. The aortic valve is tricuspid. Aortic valve regurgitation is not  visualized. No evidence of aortic valve sclerosis or stenosis.  11. The pulmonic valve was normal in structure. Pulmonic valve  regurgitation is not visualized.  12. The inferior vena cava is normal in size with greater than 50%  respiratory variability, suggesting right atrial pressure of 3 mmHg.  EKG:  EKG is ordered today.  The ekg ordered today demonstrates accelerated junctional rhythm 68 bpm with no acute ST/T wave changes.   Recent Labs: 12/09/2020: Magnesium 1.9 02/04/2021: ALT 20; B Natriuretic Peptide 267.0; BUN 15; Creatinine, Ser 1.05; Hemoglobin 9.6; Platelets 153; Potassium 2.9; Sodium 139  Recent Lipid Panel    Component Value Date/Time   CHOL 110 09/04/2020 1446   TRIG 173 (H) 09/04/2020 1446   HDL <10 (L) 09/04/2020  1446   CHOLHDL NOT CALCULATED 09/04/2020 1446   VLDL 35 09/04/2020 1446   LDLCALC NOT CALCULATED 09/04/2020 1446   Home Medications   Current Meds  Medication Sig   acetaminophen (TYLENOL) 325 MG tablet Take 2 tablets (650 mg total) by mouth every 6 (six) hours as needed for moderate pain.   albuterol (VENTOLIN HFA) 108 (90 Base) MCG/ACT inhaler Inhale 2 puffs into the lungs every 6 (six) hours as needed for wheezing or shortness of breath.   amoxicillin (AMOXIL) 500 MG capsule Take 1,000 mg by mouth 2 (two) times daily.   aspirin EC 81 MG tablet Take 81 mg by mouth daily. Swallow whole.   diclofenac Sodium (VOLTAREN) 1 % GEL APPLY 2 G TOPICALLY FOUR TIMES DAILY.   diltiazem (CARDIZEM CD) 240 MG 24 hr capsule TAKE 1 CAPSULE (240 MG TOTAL) BY MOUTH DAILY.   diltiazem (DILACOR XR) 240 MG 24 hr capsule Take 240 mg by mouth daily.   ELIQUIS 5 MG TABS tablet TAKE 1 TABLET (5 MG TOTAL) BY MOUTH 2 (TWO) TIMES DAILY.   furosemide (LASIX) 40 MG tablet Take 1 tablet (40 mg total) by mouth daily. Take once daily for swelling or  shortness of breath.   gabapentin (NEURONTIN) 400 MG capsule TAKE 1 CAPSULE (400 MG TOTAL) BY MOUTH THREE TIMES DAILY.   HYDROcodone-acetaminophen (NORCO/VICODIN) 5-325 MG tablet Take 1 tablet by mouth 3 (three) times daily as needed.   isosorbide mononitrate (IMDUR) 30 MG 24 hr tablet Take 1 tablet (30 mg total) by mouth daily.   labetalol (NORMODYNE) 200 MG tablet TAKE 2 TABLETS (400 MG TOTAL) BY MOUTH TWO TIMES DAILY.   lisinopril (ZESTRIL) 20 MG tablet Take 1 tablet by mouth daily.   Multiple Vitamins-Minerals (MULTIVITAMIN WITH MINERALS) tablet Take 1 tablet by mouth daily. Men   ondansetron (ZOFRAN) 4 MG tablet Take 1 tablet (4 mg total) by mouth every 8 (eight) hours as needed for nausea or vomiting.   oxyCODONE (OXY IR/ROXICODONE) 5 MG immediate release tablet Take 1 tablet (5 mg total) by mouth every 6 (six) hours as needed for severe pain.   potassium chloride SA  (KLOR-CON) 20 MEQ tablet Take 20 mEq by mouth daily.   traZODone (DESYREL) 100 MG tablet Take 100 mg by mouth at bedtime.   [DISCONTINUED] hydrALAZINE (APRESOLINE) 25 MG tablet Take 1 tablet (25 mg total) by mouth every 8 (eight) hours.   [DISCONTINUED] lisinopril (ZESTRIL) 10 MG tablet Take 2 tablets (20 mg total) by mouth daily.   [DISCONTINUED] spironolactone (ALDACTONE) 25 MG tablet Take 25 mg by mouth daily.     Review of Systems      All other systems reviewed and are otherwise negative except as noted above.  Physical Exam    VS:  BP 126/68   Pulse 68   Ht 5\' 11"  (1.803 m)   Wt 224 lb (101.6 kg)   SpO2 96%   BMI 31.24 kg/m  , BMI Body mass index is 31.24 kg/m.  Wt Readings from Last 3 Encounters:  03/30/21 224 lb (101.6 kg)  03/25/21 241 lb (109.3 kg)  02/04/21 245 lb (111.1 kg)     GEN: Well nourished, well developed, in no acute distress. HEENT: normal. Neck: Supple, no JVD, carotid bruits, or masses. Cardiac: RRR, no  rubs, or gallops. Gr 2/6 systolic murmur. No clubbing, cyanosis, edema.  Radials/PT 2+ and equal bilaterally.  Respiratory:  Respirations regular and unlabored, clear to auscultation bilaterally. GI: Soft, nontender, nondistended. MS: No deformity or atrophy. Skin: Warm and dry, no rash. Neuro:  Strength and sensation are intact. Psych: Normal affect.  Assessment & Plan    AAA - s/p repair in 2016 with dissectio nat anastomosis suture lines 07/2018 s/p left common and external iliac artery and left renal vein stent 09/19/18 then total arch replacement, frozen elephant trunk, head vesse reconstruction 09/2018 in 10/2018. More recently type B dissection with TEVAR at [facility].  Has not followed up with a vascular at any institution since admission.  He is due for repeat CT.  He reports no chest pain, abdominal pain.  I provided him with [facility]. [facility] contact information as she says he prefers to follow-up with [facility] rather than [facility].  We will additionally route note to  Dr. Bosie Helper so he is aware.  Continue optimal BP and lipid control.  Hyperaldosteronism -he has run out of his spironolactone which we will resume at 25 mg daily. BMP this week.  He was recommended to establish with endocrinology during most recent admission and has not yet done so.  PAF/ Chronic anticoagulation -in a junctional rhythm today.  Denies palpitations.  Denies bleeding complications on Eliquis 5 mg twice daily. Recommended for sleep  study 04/2020.  He was scheduled for CPAP titration 06/24/2020.  Unclear if this was performed.  Discuss at next follow-up.  We will prioritize follow-up with vascular, as above.  LE edema - Reports this has nearly resolved. Elevation of RLE encouraged.  Resume spironolactone, as above.  Hypokalemia -noted during recent ED visit.  Reports compliance with his potassium supplement.  Resume spironolactone, as above.  BMP for monitoring.  HTN - BP well controlled. Continue current antihypertensive regimen.    Disposition: Follow up in 2 month(s) with Dr. Bjorn PippinSchumann or APP.  Signed, Alver Sorrowaitlin S Aldan Camey, NP 03/31/2021, 8:00 AM [facility]

## 2021-03-30 ENCOUNTER — Ambulatory Visit (INDEPENDENT_AMBULATORY_CARE_PROVIDER_SITE_OTHER): Payer: Medicaid Other | Admitting: Family

## 2021-03-30 ENCOUNTER — Encounter (HOSPITAL_BASED_OUTPATIENT_CLINIC_OR_DEPARTMENT_OTHER): Payer: Self-pay | Admitting: Family

## 2021-03-30 ENCOUNTER — Other Ambulatory Visit: Payer: Self-pay

## 2021-03-30 VITALS — BP 126/68 | HR 68 | Ht 71.0 in | Wt 224.0 lb

## 2021-03-30 DIAGNOSIS — I714 Abdominal aortic aneurysm, without rupture, unspecified: Secondary | ICD-10-CM

## 2021-03-30 DIAGNOSIS — E876 Hypokalemia: Secondary | ICD-10-CM

## 2021-03-30 DIAGNOSIS — I10 Essential (primary) hypertension: Secondary | ICD-10-CM

## 2021-03-30 DIAGNOSIS — Z7901 Long term (current) use of anticoagulants: Secondary | ICD-10-CM

## 2021-03-30 DIAGNOSIS — I480 Paroxysmal atrial fibrillation: Secondary | ICD-10-CM | POA: Diagnosis not present

## 2021-03-30 DIAGNOSIS — I712 Thoracic aortic aneurysm, without rupture, unspecified: Secondary | ICD-10-CM

## 2021-03-30 MED ORDER — SPIRONOLACTONE 25 MG PO TABS: 25.0000 mg | ORAL_TABLET | Freq: Every day | ORAL | 1 refills | Status: DC

## 2021-03-30 NOTE — Patient Instructions (Addendum)
Medication Instructions:  Your physician has recommended you make the following change in your medication:   RESUME Spironolactone one 25mg  tablet daily  *If you need a refill on your cardiac medications before your next appointment, please call your pharmacy*   Lab Work: Your physician recommends that you return for lab work this week at for BMP, CBC  If you have labs (blood work) drawn today and your tests are completely normal, you will receive your results only by: MyChart Message (if you have MyChart) OR A paper copy in the mail If you have any lab test that is abnormal or we need to change your treatment, we will call you to review the results.   Testing/Procedures: None ordered today.   Follow-Up: At [facility], you and your health needs are our priority.  As part of our continuing mission to provide you with exceptional heart care, we have created designated Provider Care Teams.  These Care Teams include your primary Cardiologist (physician) and Advanced Practice Providers (APPs -  Physician Assistants and Nurse Practitioners) who all work together to provide you with the care you need, when you need it.  We recommend signing up for the patient portal called "MyChart".  Sign up information is provided on this After Visit Summary.  MyChart is used to connect with patients for Virtual Visits (Telemedicine).  Patients are able to view lab/test results, encounter notes, upcoming appointments, etc.  Non-urgent messages can be sent to your provider as well.   To learn more about what you can do with MyChart, go to [facility].    Your next appointment:   2 month(s)  The format for your next appointment:   In Person  Provider:   You may see [email or web address], MD or one of the following Advanced Practice Providers on your designated Care Team:   Little Ishikawa, PA-C Azalee Course, Micah Flesher or  [place], Judy Pimple   Other Instructions  You can have your  dental office fax a clearance request to [phone] or call [phone] at [phone]  It is important to be seen by vascular surgery. Please call Dr. [phone] office to schedule an appointment.  [facility] [address] [place], [place] [phone]

## 2021-04-03 ENCOUNTER — Ambulatory Visit: Payer: Medicaid Other

## 2021-04-03 ENCOUNTER — Other Ambulatory Visit: Payer: Self-pay

## 2021-04-03 DIAGNOSIS — R2689 Other abnormalities of gait and mobility: Secondary | ICD-10-CM | POA: Diagnosis not present

## 2021-04-03 DIAGNOSIS — M6281 Muscle weakness (generalized): Secondary | ICD-10-CM

## 2021-04-03 DIAGNOSIS — Z89612 Acquired absence of left leg above knee: Secondary | ICD-10-CM

## 2021-04-03 NOTE — Therapy (Signed)
[facility] [address] [place], [place], [address] Phone: [phone]   Fax:  [phone]  Physical Therapy Treatment  Patient Details  Name: Connor [name] MRN: [id number] Date of Birth: Jan 10, 1956 Referring Provider (PT): Dr. Sharol Given   Encounter Date: 04/03/2021   PT End of Session - 04/03/21 1432     Visit Number 5    Number of Visits 18    Date for PT Re-Evaluation 05/10/21    Authorization Type Medicaid    Authorization Time Period 03/01/21 to 05/10/21; 27 visits Max, 4 visits for first month then request more    Authorization - Visit Number 5    Authorization - Number of Visits 4    Progress Note Due on Visit 4    Equipment Utilized During Treatment Gait belt    Activity Tolerance Patient tolerated treatment well    Behavior During Therapy [facility] for tasks assessed/performed             Past Medical History:  Diagnosis Date   AAA (abdominal aortic aneurysm) ([place])    History of open heart surgery    Hypertension    Seroma due to trauma [facility])     Past Surgical History:  Procedure Laterality Date   ABDOMINAL AORTIC ANEURYSM REPAIR     AMPUTATION Left 09/07/2020   Procedure: ATTEMPTED LEFT LEG DEBRIDEMENT FASCIOTOMIES, APPLY INSTILLATION WOUND VAC, ABOVE KNEE AMPUTATION;  Surgeon: Newt Minion, MD;  Location: [place];  Service: Orthopedics;  Laterality: Left;   BUBBLE STUDY  09/05/2020   Procedure: BUBBLE STUDY;  Surgeon: Freada Bergeron, MD;  Location: [place];  Service: Cardiovascular;;   CARDIOVERSION N/A 02/11/2020   Procedure: CARDIOVERSION;  Surgeon: Donato Heinz, MD;  Location: [place];  Service: Cardiovascular;  Laterality: N/A;   CORONARY ARTERY BYPASS GRAFT     STUMP REVISION Left 09/09/2020   Procedure: REVISION LEFT ABOVE KNEE AMPUTATION;  Surgeon: Newt Minion, MD;  Location: [place];  Service: Orthopedics;  Laterality: Left;   TEE WITHOUT CARDIOVERSION N/A 09/05/2020    Procedure: TRANSESOPHAGEAL ECHOCARDIOGRAM (TEE);  Surgeon: Freada Bergeron, MD;  Location: [facility] ENDOSCOPY;  Service: Cardiovascular;  Laterality: N/A;    There were no vitals filed for this visit.   Subjective Assessment - 04/03/21 1435     Subjective Doing okay    Pertinent History L AKA 09/09/20 with Dr. Sharol Given, Received prosthetic leg in 12/2020; PMHx HTN, CHF with EF 60-65%, CAD s/p CABG, CKD stage II, Diabetes with hx of L AKA,    Limitations Standing;Walking;House hold activities;Lifting               Gait training: 1 x 115' with large based quad cane, cues for 3 step gait pattern, PT holding L UE, cane in R UE Sit to stand: 5x with pt cueing to transfer quad cane to L UE before sitting down to improve stability and then keeping L leg slightly behind to fascilitate knee flexion min A Turning 360deg: cw and ccw: 3x each, with quad cane in R UE Functional mobility training with Timed up and go test: 1x                           PT Short Term Goals - 03/28/21 1120       PT SHORT TERM GOAL #1   Title Pt will verbalize on how to properly clean components of prosthetic leg to maintain safe and sanitary  environment for residual limb during wearing of prosthetic leg    Baseline 03/28/21: pt able to verbalize how to clean liner and inside of socket    Status Achieved    Target Date 03/29/21      PT SHORT TERM GOAL #2   Title Pt will perform skin check twice a day to monitor for any skin break downs from prosthetic leg.    Baseline 03/28/21    Status Achieved    Target Date 03/29/21      PT SHORT TERM GOAL #3   Title Pt will carry extra pairs of socks and properly adjust for congruent fit of residula leg in his prosthetic leg to accomodate for fluid loss in residula leg.    Baseline 03/28/21: pt is not wearing prosthesis outside of home at this time, so has socks with him at home.    Status Not Met    Target Date 03/29/21               PT Long Term  Goals - 03/01/21 1224       PT LONG TERM GOAL #1   Title Patient will demo Timed up and go test score of <30 seconds with RW to improve functional mobility    Baseline 60 sec (03/01/21)    Time 10    Period Weeks    Status New    Target Date 05/10/21      PT LONG TERM GOAL #2   Title Pt will be able to ambulate 230 feet with RW and SBA to improve walking endurance    Baseline 115' with RW    Time 6    Period Weeks    Status New    Target Date 04/12/21      PT LONG TERM GOAL #3   Title Pt will be able to ambulate >500 feet with RW and SBA to improve community walking endurance    Baseline 115' RW and SBA (03/01/21)    Time 10    Period Weeks    Status New    Target Date 05/10/21      PT LONG TERM GOAL #4   Title Pt will primarily use his RW for inhome and community mobility to improve independence.    Baseline Uses wheelchair mostly and RW (intermittently) for in home mobility    Time 10    Period Weeks    Status New    Target Date 05/10/21                   Plan - 04/03/21 1435     Clinical Impression Statement We focused on progressing gait to LRAD of large based quad cane. Pt needs more cueing to improve weight shift to L LE for better knee flexion in prosthetic leg during late stance phase.    Personal Factors and Comorbidities Age;Comorbidity 3+;Past/Current Experience;Time since onset of injury/illness/exacerbation;Transportation    Comorbidities MHx HTN, CHF with EF 60-65%, CAD s/p CABG, CKD stage II, Diabetes with hx of L AKA, Anemia, A fib on Eliquis, and hx of abdominal aortic aneurysm repair   09/09/20 revision left AKA    Examination-Activity Limitations Bathing;Bed Mobility;Bend;Caring for Others;Carry;Hygiene/Grooming;Locomotion Level;Squat;Stairs;Stand;Toileting;Transfers    Examination-Participation Restrictions Church;Cleaning;Community Activity;Driving;Laundry;Meal Prep;Yard Work;Shop    Stability/Clinical Decision Making Stable/Uncomplicated    Rehab  Potential Good    PT Frequency --   1-2x/week   PT Duration Other (comment)   10 weeeks   PT Treatment/Interventions ADLs/Self Care Home Management;Cryotherapy;Electrical Stimulation;Moist Heat;Gait  training;Stair training;Functional mobility training;Therapeutic activities;Therapeutic exercise;Balance training;Manual techniques;Wheelchair mobility training;Prosthetic Training;Patient/family education;Neuromuscular re-education;Scar mobilization;Passive range of motion;Energy conservation;Joint Manipulations    PT Next Visit Plan continue with gait training with prosthesis, stairs, ramp/curb    PT Home Exercise Plan Verbally reviewed Hip extension in supine off edge of bed    Consulted and Agree with Plan of Care Patient             Patient will benefit from skilled therapeutic intervention in order to improve the following deficits and impairments:  Abnormal gait, Decreased activity tolerance, Decreased balance, Decreased endurance, Decreased knowledge of precautions, Decreased mobility, Decreased range of motion, Decreased safety awareness, Difficulty walking, Decreased strength, Increased edema, Increased fascial restricitons, Impaired flexibility, Improper body mechanics, Postural dysfunction, Pain, Prosthetic Dependency, Impaired sensation  Visit Diagnosis: Other abnormalities of gait and mobility  Muscle weakness (generalized)  Hx of AKA (above knee amputation), left (HCC)  Left above-knee amputee [facility])     Problem List Patient Active Problem List   Diagnosis Date Noted   Acute pulmonary edema (HCC)    Acute on chronic diastolic (congestive) heart failure (HCC) 12/08/2020   Acute on chronic diastolic CHF (congestive heart failure) ([place]) 12/07/2020   Paroxysmal A-fib (HCC) 10/10/2020   Gastritis 10/10/2020   Dyspepsia 10/07/2020   CKD stage G2/A2, GFR 60-89 and albumin creatinine ratio 30-299 mg/g 10/07/2020   Atypical chest pain 10/06/2020   Acute blood loss anemia     Essential hypertension    Postoperative pain    Severe anemia 09/19/2020   History of left above knee amputation ([place]) 09/19/2020   Left above-knee amputee ([place]) 09/19/2020   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) 08/30/2020   Hypokalemia 08/30/2020   Hyperglycemia 08/30/2020   Transaminitis 08/30/2020   Total bilirubin, elevated 08/30/2020   AKI (acute kidney injury) ([place]) 08/30/2020   Nausea vomiting and diarrhea 08/30/2020   Hypertension 05/20/2020   Persistent atrial fibrillation ([place])     Kerrie Pleasure, PT 04/03/2021, 2:41 PM  [facility] [address] [place], [place], [address] Phone: [phone]   Fax:  [phone]  Name: Kortez Murtagh MRN: [id number] Date of Birth: [birth year]

## 2021-04-06 ENCOUNTER — Ambulatory Visit: Payer: Medicaid Other | Admitting: Physical Therapy

## 2021-04-10 ENCOUNTER — Telehealth (HOSPITAL_BASED_OUTPATIENT_CLINIC_OR_DEPARTMENT_OTHER): Payer: Self-pay | Admitting: Family

## 2021-04-10 NOTE — Telephone Encounter (Signed)
   Request for surgical clearance:  What type of surgery is being performed? Surgical dental extraction ( Clearance request did not include number of teeth - attempted to call dental office to ask but went to VM)  When is this surgery scheduled? TBD   Are there any medications that need to be held prior to surgery and how long?Eliquis (unknown duration)   Name of physician performing surgery? Dr. Gery Pray   What is the office phone and fax number? Phone [phone]  Fax [phone]   ____________________________________________________   For Provider: Anesthesia listed as Versed and a narcotic Specific questions from clearance: Antibiotic prophylaxis? Interruption of anticoagulants? Is Epinephrine okay? Dental office requests medication list.

## 2021-04-11 ENCOUNTER — Ambulatory Visit: Payer: Medicaid Other

## 2021-04-11 ENCOUNTER — Other Ambulatory Visit: Payer: Self-pay

## 2021-04-11 DIAGNOSIS — M6281 Muscle weakness (generalized): Secondary | ICD-10-CM

## 2021-04-11 DIAGNOSIS — R2689 Other abnormalities of gait and mobility: Secondary | ICD-10-CM | POA: Diagnosis not present

## 2021-04-11 NOTE — Therapy (Signed)
[facility] [address] [place], [place], [address] Phone: [phone]   Fax:  [phone]  Physical Therapy Treatment  Patient Details  Name: Connor [name] MRN: [id number] Date of Birth: Jan 15, 1956 Referring Provider (PT): Dr. Sharol Given   Encounter Date: 04/11/2021   PT End of Session - 04/11/21 1314     Visit Number 6    Number of Visits 18    Date for PT Re-Evaluation 05/10/21    Authorization Type Medicaid    Authorization Time Period 8/8-9/18 12 visits    Authorization - Visit Number 2    Authorization - Number of Visits 12    Progress Note Due on Visit --    PT Start Time 1314    PT Stop Time 1400    PT Time Calculation (min) 46 min    Equipment Utilized During Treatment Gait belt    Activity Tolerance Patient tolerated treatment well    Behavior During Therapy WFL for tasks assessed/performed             Past Medical History:  Diagnosis Date   AAA (abdominal aortic aneurysm) ([place])    History of open heart surgery    Hypertension    Seroma due to trauma [facility])     Past Surgical History:  Procedure Laterality Date   ABDOMINAL AORTIC ANEURYSM REPAIR     AMPUTATION Left 09/07/2020   Procedure: ATTEMPTED LEFT LEG DEBRIDEMENT FASCIOTOMIES, APPLY INSTILLATION WOUND VAC, ABOVE KNEE AMPUTATION;  Surgeon: Newt Minion, MD;  Location: [place];  Service: Orthopedics;  Laterality: Left;   BUBBLE STUDY  09/05/2020   Procedure: BUBBLE STUDY;  Surgeon: Freada Bergeron, MD;  Location: [place];  Service: Cardiovascular;;   CARDIOVERSION N/A 02/11/2020   Procedure: CARDIOVERSION;  Surgeon: Donato Heinz, MD;  Location: [place];  Service: Cardiovascular;  Laterality: N/A;   CORONARY ARTERY BYPASS GRAFT     STUMP REVISION Left 09/09/2020   Procedure: REVISION LEFT ABOVE KNEE AMPUTATION;  Surgeon: Newt Minion, MD;  Location: [place];  Service: Orthopedics;  Laterality: Left;   TEE WITHOUT  CARDIOVERSION N/A 09/05/2020   Procedure: TRANSESOPHAGEAL ECHOCARDIOGRAM (TEE);  Surgeon: Freada Bergeron, MD;  Location: [facility] ENDOSCOPY;  Service: Cardiovascular;  Laterality: N/A;    There were no vitals filed for this visit.   Subjective Assessment - 04/11/21 1315     Subjective Pt denies any new concerns. Still having some edema in RLE with pitting in lower leg and some nonpitting in thigh. Dark area improving some per his report. Did not have prosthesis donned when arrived.    Pertinent History L AKA 09/09/20 with Dr. Sharol Given, Received prosthetic leg in 12/2020; PMHx HTN, CHF with EF 60-65%, CAD s/p CABG, CKD stage II, Diabetes with hx of L AKA,    Limitations Standing;Walking;House hold activities;Lifting    Currently in Pain? No/denies   reports some pain in residual limb at night.                              [facility] Adult PT Treatment/Exercise - 04/11/21 1317       Transfers   Transfers Sit to Stand;Stand to Sit    Sit to Stand 5: Supervision;With upper extremity assist    Stand to Sit 5: Supervision;With upper extremity assist    Stand to Sit Details Pt able to shift weight to toe on prosthetic foot to unlock without cuing today  Ambulation/Gait   Ambulation/Gait Yes    Ambulation/Gait Assistance 5: Supervision;4: Min guard    Ambulation/Gait Assistance Details Verbal cues to be sure to advance prosthetic foot all the way through and pull back on hip to lock out knee. Pt was having some times of left foot interally rotating. Did not have any socks with him to add and usually has more on around this time of day per his report.    Ambulation Distance (Feet) 345 Feet    Assistive device Rolling walker;Prosthesis   bariatric   Gait Pattern Step-through pattern;Decreased weight shift to left    Ambulation Surface Level;Indoor    Ramp 4: Min assist    Ramp Details (indicate cue type and reason) performed x 2 with walker with verbal cues for sequencing and to  keep weight back on heel shortening stride some.    Curb 4: Min assist    Curb Details (indicate cue type and reason) with RW with verbal cues for sequence    Gait Comments In // bars: gait with RUE support on bar 8' x 6. Reciprocal stepping over 2" foam beams x 6 bouts with BUE support. Pt had difficulty with clearing prosthetic foot.      Prosthetics   Prosthetic Care Comments  Pt continues to wear shrinker at night. PT discussed removing liner during the day to pat skin and liner dry as needed to help with sweating as currently wearing straight through and reports leg is really sweating at night when removed. Also advised to bring socks with him so can adjust if needed during session.    Current prosthetic wear tolerance (days/week)  daily    Current prosthetic wear tolerance (#hours/day)  all awake hours    Residual limb condition  pt denies any sores or issues other than just itching    Education Provided Skin check;Prosthetic cleaning;Correct ply sock adjustment    Person(s) Educated Patient    Education Method Explanation    Education Method Verbalized understanding    Donning Prosthesis Supervision    Doffing Prosthesis Supervision                    PT Education - 04/11/21 1515     Education Details Prosthetic education-see that section    Person(s) Educated Patient    Methods Explanation    Comprehension Verbalized understanding              PT Short Term Goals - 03/28/21 1120       PT SHORT TERM GOAL #1   Title Pt will verbalize on how to properly clean components of prosthetic leg to maintain safe and sanitary environment for residual limb during wearing of prosthetic leg    Baseline 03/28/21: pt able to verbalize how to clean liner and inside of socket    Status Achieved    Target Date 03/29/21      PT SHORT TERM GOAL #2   Title Pt will perform skin check twice a day to monitor for any skin break downs from prosthetic leg.    Baseline 03/28/21    Status  Achieved    Target Date 03/29/21      PT SHORT TERM GOAL #3   Title Pt will carry extra pairs of socks and properly adjust for congruent fit of residula leg in his prosthetic leg to accomodate for fluid loss in residula leg.    Baseline 03/28/21: pt is not wearing prosthesis outside of home at this time, so has  socks with him at home.    Status Not Met    Target Date 03/29/21               PT Long Term Goals - 03/01/21 1224       PT LONG TERM GOAL #1   Title Patient will demo Timed up and go test score of <30 seconds with RW to improve functional mobility    Baseline 60 sec (03/01/21)    Time 10    Period Weeks    Status New    Target Date 05/10/21      PT LONG TERM GOAL #2   Title Pt will be able to ambulate 230 feet with RW and SBA to improve walking endurance    Baseline 115' with RW    Time 6    Period Weeks    Status New    Target Date 04/12/21      PT LONG TERM GOAL #3   Title Pt will be able to ambulate >500 feet with RW and SBA to improve community walking endurance    Baseline 115' RW and SBA (03/01/21)    Time 10    Period Weeks    Status New    Target Date 05/10/21      PT LONG TERM GOAL #4   Title Pt will primarily use his RW for inhome and community mobility to improve independence.    Baseline Uses wheelchair mostly and RW (intermittently) for in home mobility    Time 10    Period Weeks    Status New    Target Date 05/10/21                   Plan - 04/11/21 1516     Clinical Impression Statement Pt was able to progress gait distance with RW today. He does need cuing for proper prosthetic advancement at times not swinging leg through enough to safely lock out. Leg was turning in at times during gait but pt did not bring any other socks. He was able to negotiate ramp and curb with CGA today for first time.    Personal Factors and Comorbidities Age;Comorbidity 3+;Past/Current Experience;Time since onset of  injury/illness/exacerbation;Transportation    Comorbidities MHx HTN, CHF with EF 60-65%, CAD s/p CABG, CKD stage II, Diabetes with hx of L AKA, Anemia, A fib on Eliquis, and hx of abdominal aortic aneurysm repair   09/09/20 revision left AKA    Examination-Activity Limitations Bathing;Bed Mobility;Bend;Caring for Others;Carry;Hygiene/Grooming;Locomotion Level;Squat;Stairs;Stand;Toileting;Transfers    Examination-Participation Restrictions Church;Cleaning;Community Activity;Driving;Laundry;Meal Prep;Yard Work;Shop    Stability/Clinical Decision Making Stable/Uncomplicated    Rehab Potential Good    PT Frequency --   1-2x/week   PT Duration Other (comment)   10 weeeks   PT Treatment/Interventions ADLs/Self Care Home Management;Cryotherapy;Electrical Stimulation;Moist Heat;Gait training;Stair training;Functional mobility training;Therapeutic activities;Therapeutic exercise;Balance training;Manual techniques;Wheelchair mobility training;Prosthetic Training;Patient/family education;Neuromuscular re-education;Scar mobilization;Passive range of motion;Energy conservation;Joint Manipulations    PT Next Visit Plan Continue with prosthetic education-is patient removing liner at least once now during the day to pat leg and liner dry? Gait training with walker continuing to work on improving left weight shift and swing phase on prosthetic leg. Pt was not safe to perform quad cane today but did well in // bars with 1 UE support. Continue gait on varied surfaces with walker, standing balance/weight shifting.    PT Home Exercise Plan Verbally reviewed Hip extension in supine off edge of bed    Consulted and Agree with Plan of  Care Patient             Patient will benefit from skilled therapeutic intervention in order to improve the following deficits and impairments:  Abnormal gait, Decreased activity tolerance, Decreased balance, Decreased endurance, Decreased knowledge of precautions, Decreased mobility,  Decreased range of motion, Decreased safety awareness, Difficulty walking, Decreased strength, Increased edema, Increased fascial restricitons, Impaired flexibility, Improper body mechanics, Postural dysfunction, Pain, Prosthetic Dependency, Impaired sensation  Visit Diagnosis: Other abnormalities of gait and mobility  Muscle weakness (generalized)     Problem List Patient Active Problem List   Diagnosis Date Noted   Acute pulmonary edema (HCC)    Acute on chronic diastolic (congestive) heart failure ([place]) 12/08/2020   Acute on chronic diastolic CHF (congestive heart failure) ([place]) 12/07/2020   Paroxysmal A-fib (HCC) 10/10/2020   Gastritis 10/10/2020   Dyspepsia 10/07/2020   CKD stage G2/A2, GFR 60-89 and albumin creatinine ratio 30-299 mg/g 10/07/2020   Atypical chest pain 10/06/2020   Acute blood loss anemia    Essential hypertension    Postoperative pain    Severe anemia 09/19/2020   History of left above knee amputation ([place]) 09/19/2020   Left above-knee amputee ([place]) 09/19/2020   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 ([place])    CKD (chronic kidney disease), stage III ([place]) 09/01/2020   Morbid obesity ([place]) 09/01/2020   Severe sepsis with acute organ dysfunction due to Streptococcus species ([place]) 08/30/2020   Cellulitis of left leg 08/30/2020   Lactic acidosis 08/30/2020   Elevated troponin 08/30/2020   Hypoalbuminemia 08/30/2020   Leukocytosis 08/30/2020   Thrombocytopenia ([place]) 08/30/2020   Hypokalemia 08/30/2020   Hyperglycemia 08/30/2020   Transaminitis 08/30/2020   Total bilirubin, elevated 08/30/2020   AKI (acute kidney injury) ([place]) 08/30/2020   Nausea vomiting and diarrhea 08/30/2020   Hypertension 05/20/2020   Persistent atrial fibrillation ([place])     Electa Sniff, PT, DPT, NCS 04/11/2021, 3:20 PM  [facility] [address] [place], [place], [address] Phone: [phone]   Fax:   [phone]  Name: Connor [name] MRN: [id number] Date of Birth: Sep 11, 1955

## 2021-04-11 NOTE — Telephone Encounter (Signed)
Made 2nd attempt to reach patient, left a msg for him to call the office.

## 2021-04-11 NOTE — Telephone Encounter (Signed)
Spoke with Dr Gery Pray office and was told that they do not have a patient in their system by this name and/or DOB.

## 2021-04-12 NOTE — Telephone Encounter (Signed)
Left a detailed message tat [facility]'s office to call back to clarify how many extractions the pt will be having.

## 2021-04-12 NOTE — Telephone Encounter (Signed)
Error in entering clearance - Clearance is for correct patient/procedure. Incorrect physician entered. Procedure to be performed by [facility], Dr. Rosendo Gros, at [facility] location.   Updated contact information:  Phone [phone] Fax [phone]  Alver Sorrow, NP

## 2021-04-13 ENCOUNTER — Other Ambulatory Visit: Payer: Self-pay

## 2021-04-13 ENCOUNTER — Ambulatory Visit: Payer: Medicaid Other | Admitting: Physical Therapy

## 2021-04-13 ENCOUNTER — Encounter: Payer: Self-pay | Admitting: Physical Therapy

## 2021-04-13 DIAGNOSIS — R2689 Other abnormalities of gait and mobility: Secondary | ICD-10-CM | POA: Diagnosis not present

## 2021-04-13 DIAGNOSIS — M6281 Muscle weakness (generalized): Secondary | ICD-10-CM

## 2021-04-13 NOTE — Telephone Encounter (Signed)
   Patient Name: Connor [name]  DOB: [birth year] MRN: [id number]  Primary Cardiologist: Little Ishikawa, MD  Chart reviewed as part of pre-operative protocol coverage.   Simple dental extractions of 1 or 2 teeth are considered low risk procedures per guidelines and generally do not require any specific cardiac clearance. It is also generally accepted that for simple extractions and dental cleanings, there is no need to interrupt blood thinner therapy.   SBE prophylaxis is not required for the patient from a cardiac standpoint.  I will route this recommendation to the requesting party via Epic fax function and remove from pre-op pool.  Please call with questions.  [name], PA 04/13/2021, 9:25 AM

## 2021-04-13 NOTE — Telephone Encounter (Signed)
   Connor [name] with [employer] returning call. She said the pt will have 2 teeth extraction

## 2021-04-14 NOTE — Therapy (Signed)
[facility] [address] [place], [place], [address] Phone: [phone]   Fax:  [phone]  Physical Therapy Treatment  Patient Details  Name: Connor [name] MRN: [id number] Date of Birth: 03-23-1956 Referring Provider (PT): Dr. Sharol Given   Encounter Date: 04/13/2021   PT End of Session - 04/13/21 1238     Visit Number 7    Number of Visits 18    Date for PT Re-Evaluation 05/10/21    Authorization Type Medicaid    Authorization Time Period 8/8-9/18 12 visits    Authorization - Visit Number 3    Authorization - Number of Visits 12    PT Start Time 6803    PT Stop Time 1315    PT Time Calculation (min) 40 min    Equipment Utilized During Treatment Gait belt    Activity Tolerance Patient tolerated treatment well    Behavior During Therapy WFL for tasks assessed/performed             Past Medical History:  Diagnosis Date   AAA (abdominal aortic aneurysm) ([place])    History of open heart surgery    Hypertension    Seroma due to trauma [facility])     Past Surgical History:  Procedure Laterality Date   ABDOMINAL AORTIC ANEURYSM REPAIR     AMPUTATION Left 09/07/2020   Procedure: ATTEMPTED LEFT LEG DEBRIDEMENT FASCIOTOMIES, APPLY INSTILLATION WOUND VAC, ABOVE KNEE AMPUTATION;  Surgeon: Newt Minion, MD;  Location: [facility];  Service: Orthopedics;  Laterality: Left;   BUBBLE STUDY  09/05/2020   Procedure: BUBBLE STUDY;  Surgeon: Freada Bergeron, MD;  Location: [facility];  Service: Cardiovascular;;   CARDIOVERSION N/A 02/11/2020   Procedure: CARDIOVERSION;  Surgeon: Donato Heinz, MD;  Location: [facility];  Service: Cardiovascular;  Laterality: N/A;   CORONARY ARTERY BYPASS GRAFT     STUMP REVISION Left 09/09/2020   Procedure: REVISION LEFT ABOVE KNEE AMPUTATION;  Surgeon: Newt Minion, MD;  Location: [place];  Service: Orthopedics;  Laterality: Left;   TEE WITHOUT CARDIOVERSION N/A 09/05/2020   Procedure:  TRANSESOPHAGEAL ECHOCARDIOGRAM (TEE);  Surgeon: Freada Bergeron, MD;  Location: [facility] ENDOSCOPY;  Service: Cardiovascular;  Laterality: N/A;    There were no vitals filed for this visit.   Subjective Assessment - 04/13/21 1237     Subjective No new complaints. No falls. No change in phantom pain. Right LE is getting better, appears less swollen and not as dark as it has been.    Pertinent History L AKA 09/09/20 with Dr. Sharol Given, Received prosthetic leg in 12/2020; PMHx HTN, CHF with EF 60-65%, CAD s/p CABG, CKD stage II, Diabetes with hx of L AKA,    Limitations Standing;Walking;House hold activities;Lifting    How long can you sit comfortably? no issues    How long can you stand comfortably? <5 min    How long can you walk comfortably? <5 min    Patient Stated Goals Improve walking    Currently in Pain? No/denies   residual limb pain at night   Pain Score 0-No pain                      [facility] Adult PT Treatment/Exercise - 04/13/21 1242       Transfers   Transfers Sit to Stand;Stand to Sit    Sit to Stand 5: Supervision;With upper extremity assist    Stand to Sit 5: Supervision;With upper extremity assist  Ambulation/Gait   Ambulation/Gait Yes    Ambulation/Gait Assistance 5: Supervision;4: Min guard    Ambulation/Gait Assistance Details cues to decreased momentum with advancement of prosthesis    Ambulation Distance (Feet) 115 Feet   x1, 230 x1   Assistive device Rolling walker;Prosthesis    Gait Pattern Step-through pattern;Decreased weight shift to left    Ambulation Surface Level;Indoor      Neuro Re-ed    Neuro Re-ed Details  for balance/increased prosthetic weight bearing/decreased UE support: with no UE support working on forward bean bag toss to target basket, alternating sides, min guard assist. 2 sets performed.      Prosthetics   Prosthetic Care Comments  pt attempting to don prosthesis with knee in full extension. Pt shown how to flex toes of prosthetic  foot on floor to flex knee for easier donning.    Current prosthetic wear tolerance (days/week)  daily    Current prosthetic wear tolerance (#hours/day)  when going out of house- most awake hours, when staying inside ~5 hours as he moves around home in wheelchair at this time and prosthesis gets in the way.    Residual limb condition  intact per pt report    Donning Prosthesis Supervision    Doffing Prosthesis Supervision                      PT Short Term Goals - 03/28/21 1120       PT SHORT TERM GOAL #1   Title Pt will verbalize on how to properly clean components of prosthetic leg to maintain safe and sanitary environment for residual limb during wearing of prosthetic leg    Baseline 03/28/21: pt able to verbalize how to clean liner and inside of socket    Status Achieved    Target Date 03/29/21      PT SHORT TERM GOAL #2   Title Pt will perform skin check twice a day to monitor for any skin break downs from prosthetic leg.    Baseline 03/28/21    Status Achieved    Target Date 03/29/21      PT SHORT TERM GOAL #3   Title Pt will carry extra pairs of socks and properly adjust for congruent fit of residula leg in his prosthetic leg to accomodate for fluid loss in residula leg.    Baseline 03/28/21: pt is not wearing prosthesis outside of home at this time, so has socks with him at home.    Status Not Met    Target Date 03/29/21               PT Long Term Goals - 03/01/21 1224       PT LONG TERM GOAL #1   Title Patient will demo Timed up and go test score of <30 seconds with RW to improve functional mobility    Baseline 60 sec (03/01/21)    Time 10    Period Weeks    Status New    Target Date 05/10/21      PT LONG TERM GOAL #2   Title Pt will be able to ambulate 230 feet with RW and SBA to improve walking endurance    Baseline 115' with RW    Time 6    Period Weeks    Status New    Target Date 04/12/21      PT LONG TERM GOAL #3   Title Pt will be able to  ambulate >500 feet with RW and SBA  to improve community walking endurance    Baseline 115' RW and SBA (03/01/21)    Time 10    Period Weeks    Status New    Target Date 05/10/21      PT LONG TERM GOAL #4   Title Pt will primarily use his RW for inhome and community mobility to improve independence.    Baseline Uses wheelchair mostly and RW (intermittently) for in home mobility    Time 10    Period Weeks    Status New    Target Date 05/10/21                   Plan - 04/13/21 1241     Clinical Impression Statement Today's skilled session continued to focus on gait with prosthesis/RW with emphasis on prosthetic control with swing phase, heel strike, and weight shifitng. Remainder of session began to address standing balance with decreased to no UE support with no issues noted or reported with task performed. The pt is progressing toward goals and should benefit from continued PT to progress toward unmet goals.    Personal Factors and Comorbidities Age;Comorbidity 3+;Past/Current Experience;Time since onset of injury/illness/exacerbation;Transportation    Comorbidities MHx HTN, CHF with EF 60-65%, CAD s/p CABG, CKD stage II, Diabetes with hx of L AKA, Anemia, A fib on Eliquis, and hx of abdominal aortic aneurysm repair   09/09/20 revision left AKA    Examination-Activity Limitations Bathing;Bed Mobility;Bend;Caring for Others;Carry;Hygiene/Grooming;Locomotion Level;Squat;Stairs;Stand;Toileting;Transfers    Examination-Participation Restrictions Church;Cleaning;Community Activity;Driving;Laundry;Meal Prep;Yard Work;Shop    Stability/Clinical Decision Making Stable/Uncomplicated    Rehab Potential Good    PT Frequency --   1-2x/week   PT Duration Other (comment)   10 weeeks   PT Treatment/Interventions ADLs/Self Care Home Management;Cryotherapy;Electrical Stimulation;Moist Heat;Gait training;Stair training;Functional mobility training;Therapeutic activities;Therapeutic exercise;Balance  training;Manual techniques;Wheelchair mobility training;Prosthetic Training;Patient/family education;Neuromuscular re-education;Scar mobilization;Passive range of motion;Energy conservation;Joint Manipulations    PT Next Visit Plan Continue with prosthetic education-is patient removing liner at least once now during the day to pat leg and liner dry? Gait training with walker continuing to work on improving left weight shift and swing phase on prosthetic leg.  Continue gait on varied surfaces with walker, standing balance/weight shifting.    PT Home Exercise Plan Verbally reviewed Hip extension in supine off edge of bed    Consulted and Agree with Plan of Care Patient             Patient will benefit from skilled therapeutic intervention in order to improve the following deficits and impairments:  Abnormal gait, Decreased activity tolerance, Decreased balance, Decreased endurance, Decreased knowledge of precautions, Decreased mobility, Decreased range of motion, Decreased safety awareness, Difficulty walking, Decreased strength, Increased edema, Increased fascial restricitons, Impaired flexibility, Improper body mechanics, Postural dysfunction, Pain, Prosthetic Dependency, Impaired sensation  Visit Diagnosis: Other abnormalities of gait and mobility  Muscle weakness (generalized)     Problem List Patient Active Problem List   Diagnosis Date Noted   Acute pulmonary edema (HCC)    Acute on chronic diastolic (congestive) heart failure ([place]) 12/08/2020   Acute on chronic diastolic CHF (congestive heart failure) ([place]) 12/07/2020   Paroxysmal A-fib (HCC) 10/10/2020   Gastritis 10/10/2020   Dyspepsia 10/07/2020   CKD stage G2/A2, GFR 60-89 and albumin creatinine ratio 30-299 mg/g 10/07/2020   Atypical chest pain 10/06/2020   Acute blood loss anemia    Essential hypertension    Postoperative pain    Severe anemia 09/19/2020   History of left above  knee amputation ([place]) 09/19/2020   Left  above-knee amputee ([place]) 09/19/2020   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)    CKD (chronic kidney disease), stage III ([place]) 09/01/2020   Morbid obesity ([place]) 09/01/2020   Severe sepsis with acute organ dysfunction due to Streptococcus species ([place]) 08/30/2020   Cellulitis of left leg 08/30/2020   Lactic acidosis 08/30/2020   Elevated troponin 08/30/2020   Hypoalbuminemia 08/30/2020   Leukocytosis 08/30/2020   Thrombocytopenia ([place]) 08/30/2020   Hypokalemia 08/30/2020   Hyperglycemia 08/30/2020   Transaminitis 08/30/2020   Total bilirubin, elevated 08/30/2020   AKI (acute kidney injury) ([place]) 08/30/2020   Nausea vomiting and diarrhea 08/30/2020   Hypertension 05/20/2020   Persistent atrial fibrillation ([place])     Willow Ora, PTA, [facility] [address], [place], [place] [address] [phone] 04/14/21, 12:28 PM   Name: Connor [name] MRN: [id number] Date of Birth: 09-01-1955

## 2021-04-18 ENCOUNTER — Encounter: Payer: Self-pay | Admitting: Physical Therapy

## 2021-04-18 ENCOUNTER — Other Ambulatory Visit: Payer: Self-pay

## 2021-04-18 ENCOUNTER — Ambulatory Visit: Payer: Medicaid Other | Admitting: Physical Therapy

## 2021-04-18 DIAGNOSIS — R2689 Other abnormalities of gait and mobility: Secondary | ICD-10-CM

## 2021-04-18 DIAGNOSIS — M6281 Muscle weakness (generalized): Secondary | ICD-10-CM

## 2021-04-18 NOTE — Therapy (Signed)
[facility] [address] [place], [place], [address] Phone: [phone]   Fax:  [phone]  Physical Therapy Treatment  Patient Details  Name: Connor [name] MRN: [id number] Date of Birth: January 28, 1956 Referring Provider (PT): Dr. Sharol Given   Encounter Date: 04/18/2021   PT End of Session - 04/18/21 1235     Visit Number 8    Number of Visits 18    Date for PT Re-Evaluation 05/10/21    Authorization Type Medicaid    Authorization Time Period 8/8-9/18 12 visits    Authorization - Visit Number 4    Authorization - Number of Visits 12    PT Start Time 1232    PT Stop Time 1310   needed bathroom   PT Time Calculation (min) 38 min    Equipment Utilized During Treatment Gait belt    Activity Tolerance Patient tolerated treatment well    Behavior During Therapy WFL for tasks assessed/performed             Past Medical History:  Diagnosis Date   AAA (abdominal aortic aneurysm) ([place])    History of open heart surgery    Hypertension    Seroma due to trauma [facility])     Past Surgical History:  Procedure Laterality Date   ABDOMINAL AORTIC ANEURYSM REPAIR     AMPUTATION Left 09/07/2020   Procedure: ATTEMPTED LEFT LEG DEBRIDEMENT FASCIOTOMIES, APPLY INSTILLATION WOUND VAC, ABOVE KNEE AMPUTATION;  Surgeon: Newt Minion, MD;  Location: [facility];  Service: Orthopedics;  Laterality: Left;   BUBBLE STUDY  09/05/2020   Procedure: BUBBLE STUDY;  Surgeon: Freada Bergeron, MD;  Location: [facility];  Service: Cardiovascular;;   CARDIOVERSION N/A 02/11/2020   Procedure: CARDIOVERSION;  Surgeon: Donato Heinz, MD;  Location: [facility];  Service: Cardiovascular;  Laterality: N/A;   CORONARY ARTERY BYPASS GRAFT     STUMP REVISION Left 09/09/2020   Procedure: REVISION LEFT ABOVE KNEE AMPUTATION;  Surgeon: Newt Minion, MD;  Location: [facility];  Service: Orthopedics;  Laterality: Left;   TEE WITHOUT CARDIOVERSION N/A  09/05/2020   Procedure: TRANSESOPHAGEAL ECHOCARDIOGRAM (TEE);  Surgeon: Freada Bergeron, MD;  Location: [facility] ENDOSCOPY;  Service: Cardiovascular;  Laterality: N/A;    There were no vitals filed for this visit.   Subjective Assessment - 04/18/21 1235     Subjective No new complaints. No falls or pain to report.    Pertinent History L AKA 09/09/20 with Dr. Sharol Given, Received prosthetic leg in 12/2020; PMHx HTN, CHF with EF 60-65%, CAD s/p CABG, CKD stage II, Diabetes with hx of L AKA,    Limitations Standing;Walking;House hold activities;Lifting    How long can you sit comfortably? no issues    How long can you stand comfortably? <5 min    Patient Stated Goals Improve walking    Currently in Pain? No/denies                    [facility] Adult PT Treatment/Exercise - 04/18/21 1236       Transfers   Transfers Sit to Stand;Stand to Sit    Sit to Stand 5: Supervision;With upper extremity assist    Sit to Stand Details (indicate cue type and reason) cues for hand placement and prosthetic foot placement with standing up    Stand to Sit 5: Supervision;With upper extremity assist    Stand to Sit Details cues to reach back with sitting down      Ambulation/Gait  Ambulation/Gait Yes    Ambulation/Gait Assistance 5: Supervision;4: Min guard    Ambulation/Gait Assistance Details cues for posture, step placement, heel strike so not to lock out knee in the air and for weight shifting over prosthesis in stance phase. pt continues to have a heavy reliance on walker with gait.    Ambulation Distance (Feet) 115 Feet   x 2   Assistive device Rolling walker;Prosthesis    Gait Pattern Step-through pattern;Decreased weight shift to left    Ambulation Surface Level;Indoor      Knee/Hip Exercises: Aerobic   Other Aerobic Scifit UE's/LE and prosthesis on level 2.5 x 4 minutes, rest for 2 minutes, then 4 more minutes of work. SaO2 decr to 90% with HR max of 118 bpm after 2cd bout. Both recovered with  seated rest break with emphasis on slow,deep breathing. pt educated to use this interval style on seated recumbent bike when he starts back at the gym with his friend.      Prosthetics   Prosthetic Care Comments  pt able to return demo of bending prosthetic knee to don as was shown/eductated on last session    Current prosthetic wear tolerance (days/week)  5/7 days    Current prosthetic wear tolerance (#hours/day)  all awake hours with drying as needed    Residual limb condition  intact per pt report    Donning Prosthesis Supervision    Doffing Prosthesis Supervision                      PT Short Term Goals - 03/28/21 1120       PT SHORT TERM GOAL #1   Title Pt will verbalize on how to properly clean components of prosthetic leg to maintain safe and sanitary environment for residual limb during wearing of prosthetic leg    Baseline 03/28/21: pt able to verbalize how to clean liner and inside of socket    Status Achieved    Target Date 03/29/21      PT SHORT TERM GOAL #2   Title Pt will perform skin check twice a day to monitor for any skin break downs from prosthetic leg.    Baseline 03/28/21    Status Achieved    Target Date 03/29/21      PT SHORT TERM GOAL #3   Title Pt will carry extra pairs of socks and properly adjust for congruent fit of residula leg in his prosthetic leg to accomodate for fluid loss in residula leg.    Baseline 03/28/21: pt is not wearing prosthesis outside of home at this time, so has socks with him at home.    Status Not Met    Target Date 03/29/21               PT Long Term Goals - 03/01/21 1224       PT LONG TERM GOAL #1   Title Patient will demo Timed up and go test score of <30 seconds with RW to improve functional mobility    Baseline 60 sec (03/01/21)    Time 10    Period Weeks    Status New    Target Date 05/10/21      PT LONG TERM GOAL #2   Title Pt will be able to ambulate 230 feet with RW and SBA to improve walking endurance     Baseline 115' with RW    Time 6    Period Weeks    Status New  Target Date 04/12/21      PT LONG TERM GOAL #3   Title Pt will be able to ambulate >500 feet with RW and SBA to improve community walking endurance    Baseline 115' RW and SBA (03/01/21)    Time 10    Period Weeks    Status New    Target Date 05/10/21      PT LONG TERM GOAL #4   Title Pt will primarily use his RW for inhome and community mobility to improve independence.    Baseline Uses wheelchair mostly and RW (intermittently) for in home mobility    Time 10    Period Weeks    Status New    Target Date 05/10/21                   Plan - 04/18/21 1236     Clinical Impression Statement Today's skilled session continued to focus on gait training with prosthesis/RW with cues needed to correct gait deviations. Also initiated use of Scifit for strengthening/activity tolerance as pt was asking about use of equipment if he returns to the gym with his friend. Also discussed use of UE weight machines in sitting and use of LE machines for sound limb only in sitting. Pt verbalized understanding. The pt is making progress toward goals and should benefit from continued PT to progress toward unmet goals.    Personal Factors and Comorbidities Age;Comorbidity 3+;Past/Current Experience;Time since onset of injury/illness/exacerbation;Transportation    Comorbidities MHx HTN, CHF with EF 60-65%, CAD s/p CABG, CKD stage II, Diabetes with hx of L AKA, Anemia, A fib on Eliquis, and hx of abdominal aortic aneurysm repair   09/09/20 revision left AKA    Examination-Activity Limitations Bathing;Bed Mobility;Bend;Caring for Others;Carry;Hygiene/Grooming;Locomotion Level;Squat;Stairs;Stand;Toileting;Transfers    Examination-Participation Restrictions Church;Cleaning;Community Activity;Driving;Laundry;Meal Prep;Yard Work;Shop    Stability/Clinical Decision Making Stable/Uncomplicated    Rehab Potential Good    PT Frequency --   1-2x/week    PT Duration Other (comment)   10 weeeks   PT Treatment/Interventions ADLs/Self Care Home Management;Cryotherapy;Electrical Stimulation;Moist Heat;Gait training;Stair training;Functional mobility training;Therapeutic activities;Therapeutic exercise;Balance training;Manual techniques;Wheelchair mobility training;Prosthetic Training;Patient/family education;Neuromuscular re-education;Scar mobilization;Passive range of motion;Energy conservation;Joint Manipulations    PT Next Visit Plan Continue with prosthetic education-is patient removing liner at least once now during the day to pat leg and liner dry? Gait training with walker continuing to work on improving left weight shift and swing phase on prosthetic leg.  Continue gait on varied surfaces with walker, standing balance/weight shifting.    PT Home Exercise Plan Verbally reviewed Hip extension in supine off edge of bed    Consulted and Agree with Plan of Care Patient             Patient will benefit from skilled therapeutic intervention in order to improve the following deficits and impairments:  Abnormal gait, Decreased activity tolerance, Decreased balance, Decreased endurance, Decreased knowledge of precautions, Decreased mobility, Decreased range of motion, Decreased safety awareness, Difficulty walking, Decreased strength, Increased edema, Increased fascial restricitons, Impaired flexibility, Improper body mechanics, Postural dysfunction, Pain, Prosthetic Dependency, Impaired sensation  Visit Diagnosis: Other abnormalities of gait and mobility  Muscle weakness (generalized)     Problem List Patient Active Problem List   Diagnosis Date Noted   Acute pulmonary edema (HCC)    Acute on chronic diastolic (congestive) heart failure ([place]) 12/08/2020   Acute on chronic diastolic CHF (congestive heart failure) ([place]) 12/07/2020   Paroxysmal A-fib ([place]) 10/10/2020   Gastritis 10/10/2020   Dyspepsia  10/07/2020   CKD stage G2/A2, GFR 60-89  and albumin creatinine ratio 30-299 mg/g 10/07/2020   Atypical chest pain 10/06/2020   Acute blood loss anemia    Essential hypertension    Postoperative pain    Severe anemia 09/19/2020   History of left above knee amputation ([place]) 09/19/2020   Left above-knee amputee ([place]) 09/19/2020   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 ([place])    CKD (chronic kidney disease), stage III ([place]) 09/01/2020   Morbid obesity ([place]) 09/01/2020   Severe sepsis with acute organ dysfunction due to Streptococcus species ([place]) 08/30/2020   Cellulitis of left leg 08/30/2020   Lactic acidosis 08/30/2020   Elevated troponin 08/30/2020   Hypoalbuminemia 08/30/2020   Leukocytosis 08/30/2020   Thrombocytopenia ([place]) 08/30/2020   Hypokalemia 08/30/2020   Hyperglycemia 08/30/2020   Transaminitis 08/30/2020   Total bilirubin, elevated 08/30/2020   AKI (acute kidney injury) ([place]) 08/30/2020   Nausea vomiting and diarrhea 08/30/2020   Hypertension 05/20/2020   Persistent atrial fibrillation ([place])     Willow Ora 04/19/2021, 3:08 PM  [facility] [address] [place], [place], [address] Phone: [phone]   Fax:  [phone]  Name: Connor [name] MRN: [id number] Date of Birth: 1956/05/26

## 2021-04-20 ENCOUNTER — Ambulatory Visit: Payer: Medicaid Other

## 2021-04-20 ENCOUNTER — Other Ambulatory Visit: Payer: Self-pay

## 2021-04-20 ENCOUNTER — Telehealth: Payer: Self-pay

## 2021-04-20 DIAGNOSIS — R2689 Other abnormalities of gait and mobility: Secondary | ICD-10-CM

## 2021-04-20 DIAGNOSIS — M6281 Muscle weakness (generalized): Secondary | ICD-10-CM

## 2021-04-20 NOTE — Telephone Encounter (Signed)
Connor [name], I saw Connor [name] today for PT. He is new to me but I wanted to discuss some concerns I had at the visit. He reported that he had upset stomach yesterday. I saw that he was in ED on 7/30 to rule out DVT in right leg which did come back clear. At my session today he appeared more SOB with activity and reported some pulsating discomfort in right upper chest at his incision site. States he has had this some since his aneurysm surgery in June. O2 sat at rest was 94% and after walking 86%. With him not having any history of lung pathology I was concerned about this. I'm wondering what you would recommend? Did you want him to come in for a visit. I did tell him that if anything changed and he was to go to the ER immediately. Thanks for any help you can offer. Elmer Bales, PT, DPT, NCS

## 2021-04-20 NOTE — Telephone Encounter (Signed)
Spoke with pt regarding his SOB. He does take 40mg  lasix qd, has not missed any doses. He denies CP, heart palps, dizziness, cough. He does not believe he is out of rhythm. He is unsure of weight gain, but does feel swollen. He does not have a scale.    Advised pt to double his lasix the next 2 days. He is to call the office tomorrow afternoon if he feels no improvement in his SOB. He is to avoid excess fluid and sodium over the weekend as well.    He was strongly encouraged to contact his surgeons office ASAP regarding follow up and follow up CT. He verbalized understanding of this importance.

## 2021-04-20 NOTE — Telephone Encounter (Signed)
Thank you all for your quick response.

## 2021-04-20 NOTE — Telephone Encounter (Signed)
Agree with plan of care. Thank you for speaking with him!  Alver Sorrow, NP

## 2021-04-20 NOTE — Therapy (Signed)
[facility] [address] [place], [place], [address] Phone: [phone]   Fax:  [phone]  Physical Therapy Treatment  Patient Details  Name: Connor [name] MRN: [id number] Date of Birth: 1956-08-26 Referring Provider (PT): Dr. Sharol Given   Encounter Date: 04/20/2021   PT End of Session - 04/20/21 1320     Visit Number 9    Number of Visits 18    Date for PT Re-Evaluation 05/10/21    Authorization Type Medicaid    Authorization Time Period 8/8-9/18 12 visits    Authorization - Visit Number 5    Authorization - Number of Visits 12    PT Start Time 1318    PT Stop Time 1400    PT Time Calculation (min) 42 min    Equipment Utilized During Treatment Gait belt    Activity Tolerance Patient tolerated treatment well    Behavior During Therapy WFL for tasks assessed/performed             Past Medical History:  Diagnosis Date   AAA (abdominal aortic aneurysm) ([place])    History of open heart surgery    Hypertension    Seroma due to trauma [facility])     Past Surgical History:  Procedure Laterality Date   ABDOMINAL AORTIC ANEURYSM REPAIR     AMPUTATION Left 09/07/2020   Procedure: ATTEMPTED LEFT LEG DEBRIDEMENT FASCIOTOMIES, APPLY INSTILLATION WOUND VAC, ABOVE KNEE AMPUTATION;  Surgeon: Newt Minion, MD;  Location: [place];  Service: Orthopedics;  Laterality: Left;   BUBBLE STUDY  09/05/2020   Procedure: BUBBLE STUDY;  Surgeon: Freada Bergeron, MD;  Location: [place];  Service: Cardiovascular;;   CARDIOVERSION N/A 02/11/2020   Procedure: CARDIOVERSION;  Surgeon: Donato Heinz, MD;  Location: [place];  Service: Cardiovascular;  Laterality: N/A;   CORONARY ARTERY BYPASS GRAFT     STUMP REVISION Left 09/09/2020   Procedure: REVISION LEFT ABOVE KNEE AMPUTATION;  Surgeon: Newt Minion, MD;  Location: [place];  Service: Orthopedics;  Laterality: Left;   TEE WITHOUT CARDIOVERSION N/A 09/05/2020   Procedure:  TRANSESOPHAGEAL ECHOCARDIOGRAM (TEE);  Surgeon: Freada Bergeron, MD;  Location: [facility];  Service: Cardiovascular;  Laterality: N/A;    There were no vitals filed for this visit.   Subjective Assessment - 04/20/21 1320     Subjective Pt reports that stomach was upset yesterday.    Pertinent History L AKA 09/09/20 with Dr. Sharol Given, Received prosthetic leg in 12/2020; PMHx HTN, CHF with EF 60-65%, CAD s/p CABG, CKD stage II, Diabetes with hx of L AKA,    Limitations Standing;Walking;House hold activities;Lifting    How long can you sit comfortably? no issues    How long can you stand comfortably? <5 min    Patient Stated Goals Improve walking    Currently in Pain? No/denies                               [facility] Adult PT Treatment/Exercise - 04/20/21 1321       Transfers   Transfers Stand to Sit;Sit to Stand    Sit to Stand 5: Supervision;With upper extremity assist    Stand to Sit 5: Supervision      Ambulation/Gait   Ambulation/Gait Yes    Ambulation/Gait Assistance 4: Min guard    Ambulation/Gait Assistance Details PT noted pt's prosthetic foot rotating throughout first bout so stopped to donn more socks. Increased from 4  to 8 ply. Improved with second bout. Pt reported SOB after each gait bout. Started to develop some right upper chest pain/pulsating. O2 sat decreased to 86% with this and increased back to 94% with sitting about 2 min. HR=100. Pt does report the pulsating feeling has been there since after his aorta repair surgery in June. Further treatment withheld and PT discussed that she would be contacting cardiologist office. Pt did not want to go to ER at this time.    Ambulation Distance (Feet) 115 Feet   x2   Assistive device Rolling walker;Prosthesis    Gait Pattern Step-through pattern;Decreased stance time - left;Decreased weight shift to left    Ambulation Surface Level;Indoor      Prosthetics   Prosthetic Care Comments  Currently wearing 4 ply  upon arrival.    Current prosthetic wear tolerance (days/week)  daily    Current prosthetic wear tolerance (#hours/day)  all awake hours (other than yesterday due to upset stomach)    Residual limb condition  intact per pt    Donning Prosthesis Supervision    Doffing Prosthesis Supervision                    PT Education - 04/20/21 1446     Education Details Pt was instructed to go to ER if any change in symptoms with more chest pain or more SOB. PT will be notifying cardiologist of findings today to get their thoughts.    Person(s) Educated Patient    Methods Explanation    Comprehension Verbalized understanding              PT Short Term Goals - 03/28/21 1120       PT SHORT TERM GOAL #1   Title Pt will verbalize on how to properly clean components of prosthetic leg to maintain safe and sanitary environment for residual limb during wearing of prosthetic leg    Baseline 03/28/21: pt able to verbalize how to clean liner and inside of socket    Status Achieved    Target Date 03/29/21      PT SHORT TERM GOAL #2   Title Pt will perform skin check twice a day to monitor for any skin break downs from prosthetic leg.    Baseline 03/28/21    Status Achieved    Target Date 03/29/21      PT SHORT TERM GOAL #3   Title Pt will carry extra pairs of socks and properly adjust for congruent fit of residula leg in his prosthetic leg to accomodate for fluid loss in residula leg.    Baseline 03/28/21: pt is not wearing prosthesis outside of home at this time, so has socks with him at home.    Status Not Met    Target Date 03/29/21               PT Long Term Goals - 03/01/21 1224       PT LONG TERM GOAL #1   Title Patient will demo Timed up and go test score of <30 seconds with RW to improve functional mobility    Baseline 60 sec (03/01/21)    Time 10    Period Weeks    Status New    Target Date 05/10/21      PT LONG TERM GOAL #2   Title Pt will be able to ambulate 230  feet with RW and SBA to improve walking endurance    Baseline 115' with RW    Time  6    Period Weeks    Status New    Target Date 04/12/21      PT LONG TERM GOAL #3   Title Pt will be able to ambulate >500 feet with RW and SBA to improve community walking endurance    Baseline 115' RW and SBA (03/01/21)    Time 10    Period Weeks    Status New    Target Date 05/10/21      PT LONG TERM GOAL #4   Title Pt will primarily use his RW for inhome and community mobility to improve independence.    Baseline Uses wheelchair mostly and RW (intermittently) for in home mobility    Time 10    Period Weeks    Status New    Target Date 05/10/21                   Plan - 04/20/21 1455     Clinical Impression Statement Pt's treatment limited today as pt was reporting some SOB with gait and PT noted drop in O2 sat with some right sided chest pain. Pt reporting that he has had that pulsating feeling where is incision on chest is before. PT contacting cardiologist to discuss. Pt did not want to go to ER if can help it but PT did let him know that if anything changed he would need to.    Personal Factors and Comorbidities Age;Comorbidity 3+;Past/Current Experience;Time since onset of injury/illness/exacerbation;Transportation    Comorbidities MHx HTN, CHF with EF 60-65%, CAD s/p CABG, CKD stage II, Diabetes with hx of L AKA, Anemia, A fib on Eliquis, and hx of abdominal aortic aneurysm repair   09/09/20 revision left AKA    Examination-Activity Limitations Bathing;Bed Mobility;Bend;Caring for Others;Carry;Hygiene/Grooming;Locomotion Level;Squat;Stairs;Stand;Toileting;Transfers    Examination-Participation Restrictions Church;Cleaning;Community Activity;Driving;Laundry;Meal Prep;Yard Work;Shop    Stability/Clinical Decision Making Stable/Uncomplicated    Rehab Potential Good    PT Frequency --   1-2x/week   PT Duration Other (comment)   10 weeeks   PT Treatment/Interventions ADLs/Self Care Home  Management;Cryotherapy;Electrical Stimulation;Moist Heat;Gait training;Stair training;Functional mobility training;Therapeutic activities;Therapeutic exercise;Balance training;Manual techniques;Wheelchair mobility training;Prosthetic Training;Patient/family education;Neuromuscular re-education;Scar mobilization;Passive range of motion;Energy conservation;Joint Manipulations    PT Next Visit Plan Did we hear back from cardiologist. I sent inbasket right after visit notifity on concerns. How has pt been feeling? Monitor vitals closely. Continue with prosthetic education-is patient removing liner at least once now during the day to pat leg and liner dry? Gait training with walker continuing to work on improving left weight shift and swing phase on prosthetic leg.  Continue gait on varied surfaces with walker, standing balance/weight shifting.    PT Home Exercise Plan Verbally reviewed Hip extension in supine off edge of bed    Consulted and Agree with Plan of Care Patient             Patient will benefit from skilled therapeutic intervention in order to improve the following deficits and impairments:  Abnormal gait, Decreased activity tolerance, Decreased balance, Decreased endurance, Decreased knowledge of precautions, Decreased mobility, Decreased range of motion, Decreased safety awareness, Difficulty walking, Decreased strength, Increased edema, Increased fascial restricitons, Impaired flexibility, Improper body mechanics, Postural dysfunction, Pain, Prosthetic Dependency, Impaired sensation  Visit Diagnosis: Other abnormalities of gait and mobility  Muscle weakness (generalized)     Problem List Patient Active Problem List   Diagnosis Date Noted   Acute pulmonary edema (HCC)    Acute on chronic diastolic (congestive) heart failure ([place]) 12/08/2020  Acute on chronic diastolic CHF (congestive heart failure) ([place]) 12/07/2020   Paroxysmal A-fib (HCC) 10/10/2020   Gastritis 10/10/2020    Dyspepsia 10/07/2020   CKD stage G2/A2, GFR 60-89 and albumin creatinine ratio 30-299 mg/g 10/07/2020   Atypical chest pain 10/06/2020   Acute blood loss anemia    Essential hypertension    Postoperative pain    Severe anemia 09/19/2020   History of left above knee amputation ([place]) 09/19/2020   Left above-knee amputee ([place]) 09/19/2020   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 ([place])    CKD (chronic kidney disease), stage III ([place]) 09/01/2020   Morbid obesity ([place]) 09/01/2020   Severe sepsis with acute organ dysfunction due to Streptococcus species ([place]) 08/30/2020   Cellulitis of left leg 08/30/2020   Lactic acidosis 08/30/2020   Elevated troponin 08/30/2020   Hypoalbuminemia 08/30/2020   Leukocytosis 08/30/2020   Thrombocytopenia ([place]) 08/30/2020   Hypokalemia 08/30/2020   Hyperglycemia 08/30/2020   Transaminitis 08/30/2020   Total bilirubin, elevated 08/30/2020   AKI (acute kidney injury) ([place]) 08/30/2020   Nausea vomiting and diarrhea 08/30/2020   Hypertension 05/20/2020   Persistent atrial fibrillation ([place])     Electa Sniff, PT, DPT, NCS 04/20/2021, 3:03 PM  [facility] [address] [place], [place], [address] Phone: [phone]   Fax:  [phone]  Name: Connor [name] MRN: [id number] Date of Birth: [birth year]

## 2021-04-20 NOTE — Telephone Encounter (Signed)
Connor [name],   Patient seen in clinic 03/30/21 after AAA intervention in June at outside hospital. He was recommended for ASAP follow up with [facility] (was due for repeat CT 30 day after hospital discharge but did not have performed and preferred to follow with [facility] rather than outside provider). Does not appear he has contacted them to schedule his appointment. He really needs to contact them to schedule his overdue follow up and CT.  Echo during admit 01/2021 normal LVEF 55%, moderate PAH. If he has increasing dyspnea he should take his PRN Lasix. I have CC'd our triage team so they can call and check on him. He has a scheduled appointment 06/23/21 with Dr. Bjorn Pippin. If deemed necessary by triage team he can likely be worked in sooner.  Connor Sorrow, NP

## 2021-04-25 ENCOUNTER — Ambulatory Visit: Payer: Medicaid Other

## 2021-04-26 ENCOUNTER — Telehealth: Payer: Self-pay | Admitting: Vascular Surgery

## 2021-04-26 NOTE — Telephone Encounter (Signed)
Patient will need to be seen by Dr. Ancil Linsey office at [facility] per Dr. Arbie Cookey.  Will route to our triage team to contact the patient and provide Dr. Ancil Linsey contact information which is listed below. Will also route this note to Dr. Ancil Linsey office via Epic Fax function so they are aware to contact patient to schedule follow up after his 01/2021 admission.   [phone] [phone]  Alver Sorrow, NP

## 2021-04-26 NOTE — Telephone Encounter (Signed)
LMTCB

## 2021-04-26 NOTE — Telephone Encounter (Signed)
I received a staff message from Gillian Shields NP with [facility] heart [facility].  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  He had been scheduled for 1 month CT follow-up at [facility].  Apparently the patient requested to be seen by our practice.  I responded to the staff message to Ms. Walker.  Explained that the patient really needs to follow-up with [facility].  He has extremely complex aortic pathology with prior of his ascending arch emergently at [facility] in [place] and extensive treatment at [facility] atrium in [place] and now recent reintervention at [facility] in June 2022.  It is not appropriate for him to follow-up with [place].  He needs to continue his follow-up with [facility].

## 2021-04-27 ENCOUNTER — Ambulatory Visit: Payer: Medicaid Other | Admitting: Physical Therapy

## 2021-04-27 NOTE — Telephone Encounter (Signed)
Called and LM for pt of Dr Parodi's phone # as listed.  Requested he call back if any questions.

## 2021-05-02 ENCOUNTER — Ambulatory Visit: Payer: Medicaid Other | Admitting: Physical Therapy

## 2021-05-04 ENCOUNTER — Ambulatory Visit: Payer: Medicaid Other

## 2021-05-09 ENCOUNTER — Ambulatory Visit: Payer: Medicaid Other | Admitting: Physical Therapy

## 2021-05-11 ENCOUNTER — Ambulatory Visit: Payer: Medicaid Other | Admitting: Physical Therapy

## 2021-05-16 ENCOUNTER — Telehealth: Payer: Self-pay | Admitting: Orthopedic Surgery

## 2021-05-16 ENCOUNTER — Ambulatory Visit: Payer: Medicaid Other | Admitting: Physical Therapy

## 2021-05-16 NOTE — Telephone Encounter (Signed)
Pt called requesting a call back. Pt states he would like to go to the first physical therapist rehab we gave him. The facility pt goes to now he stated he dont like it. The session hurt him worse. Please call pt about this matter at [phone].

## 2021-05-17 ENCOUNTER — Other Ambulatory Visit: Payer: Self-pay

## 2021-05-17 ENCOUNTER — Telehealth: Payer: Self-pay

## 2021-05-17 DIAGNOSIS — Z89612 Acquired absence of left leg above knee: Secondary | ICD-10-CM

## 2021-05-17 NOTE — Telephone Encounter (Signed)
Received referral on patient from [facility] , left VM 8/3. Patient did not respond. Closed referral

## 2021-05-17 NOTE — Telephone Encounter (Signed)
I called pt and he states that he does not want to go to another physical therapy office that was not what his message was. He states that he had a bad reaction to the 2nd covid injection and missed several of his therapy visit due to being in the hospital. He is wanting to restart therapy and was advised by the PT office that he would need another referral being that so much time had passed since the last one. I apologized for the confusion and advised that I would enter this referral today for [facility] out patient neuro rehab s/p left AKA for prosthetic training. He will call with any other questions.

## 2021-05-18 ENCOUNTER — Ambulatory Visit: Payer: Medicaid Other

## 2021-06-08 ENCOUNTER — Other Ambulatory Visit: Payer: Self-pay

## 2021-06-08 ENCOUNTER — Ambulatory Visit: Payer: Medicaid Other | Attending: Orthopedic Surgery

## 2021-06-08 VITALS — BP 148/78 | HR 64

## 2021-06-08 DIAGNOSIS — M6281 Muscle weakness (generalized): Secondary | ICD-10-CM | POA: Diagnosis present

## 2021-06-08 DIAGNOSIS — R2681 Unsteadiness on feet: Secondary | ICD-10-CM | POA: Diagnosis present

## 2021-06-08 DIAGNOSIS — R293 Abnormal posture: Secondary | ICD-10-CM | POA: Diagnosis present

## 2021-06-08 DIAGNOSIS — R2689 Other abnormalities of gait and mobility: Secondary | ICD-10-CM | POA: Insufficient documentation

## 2021-06-08 NOTE — Therapy (Signed)
[facility] [address] [place], [place], [address] Phone: [phone]   Fax:  [phone]  Physical Therapy Evaluation  Patient Details  Name: Connor [name] MRN: [id number] Date of Birth: May 26, 1956 Referring Provider (PT): Dr. Lajoyce Corners   Encounter Date: 06/08/2021   PT End of Session - 06/08/21 1147     Visit Number 1    Number of Visits 17    Date for PT Re-Evaluation 08/04/21    Authorization Type Medicaid (has used 9 visit this year so far)    Authorization Time Period --    Authorization - Visit Number --    Authorization - Number of Visits --    PT Start Time 1103    PT Stop Time 1148    PT Time Calculation (min) 45 min    Equipment Utilized During Treatment Gait belt    Activity Tolerance Patient tolerated treatment well    Behavior During Therapy WFL for tasks assessed/performed             Past Medical History:  Diagnosis Date   AAA (abdominal aortic aneurysm) (HCC)    History of open heart surgery    Hypertension    Seroma due to trauma [facility])     Past Surgical History:  Procedure Laterality Date   ABDOMINAL AORTIC ANEURYSM REPAIR     AMPUTATION Left 09/07/2020   Procedure: ATTEMPTED LEFT LEG DEBRIDEMENT FASCIOTOMIES, APPLY INSTILLATION WOUND VAC, ABOVE KNEE AMPUTATION;  Surgeon: Nadara Mustard, MD;  Location: [facility] OR;  Service: Orthopedics;  Laterality: Left;   BUBBLE STUDY  09/05/2020   Procedure: BUBBLE STUDY;  Surgeon: Meriam Sprague, MD;  Location: [facility] ENDOSCOPY;  Service: Cardiovascular;;   CARDIOVERSION N/A 02/11/2020   Procedure: CARDIOVERSION;  Surgeon: Little Ishikawa, MD;  Location: [facility] ENDOSCOPY;  Service: Cardiovascular;  Laterality: N/A;   CORONARY ARTERY BYPASS GRAFT     STUMP REVISION Left 09/09/2020   Procedure: REVISION LEFT ABOVE KNEE AMPUTATION;  Surgeon: Nadara Mustard, MD;  Location: [facility] OR;  Service: Orthopedics;  Laterality: Left;   TEE WITHOUT CARDIOVERSION N/A  09/05/2020   Procedure: TRANSESOPHAGEAL ECHOCARDIOGRAM (TEE);  Surgeon: Meriam Sprague, MD;  Location: [facility] ENDOSCOPY;  Service: Cardiovascular;  Laterality: N/A;    Vitals:   06/08/21 1118  BP: (!) 148/78  Pulse: 64  SpO2: 97%      Subjective Assessment - 06/08/21 1105     Subjective Pt known to this therapist. Pt was rehospitalized at [facility] for CHF with hypoxia. He reports that he did have his CT scan to check on the AAA repair  and everything looked good on that. Pt reports that since getting home things have been kind of rough. He has been trying to get outside but having more trouble getting in/out of vehicle. He is driving. Reports energy has been better since being home. 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 Had been undergoing PT at this clinic prior to hospitalization.    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    Patient Stated Goals Improve walking    Currently in  Pain? No/denies                [facility] PT Assessment - 06/08/21 1111       Assessment   Medical Diagnosis history of L AKA    Referring Provider (PT) Dr. Lajoyce Corners    Onset Date/Surgical Date 05/17/21   L AKA 09/09/20 with Dr. Lajoyce Corners, Received prosthetic leg in 12/2020     Precautions   Precautions Fall      Balance Screen   Has the patient fallen in the past 6 months No    Has the patient had a decrease in activity level because of a fear of falling?  No    Is the patient reluctant to leave their home because of a fear of falling?  Yes      Home Environment    Living Environment Private residence    Living Arrangements Other relatives    Available Help at Discharge Family    Type of Home House    Home Access Stairs to enter;Ramped entrance    Entrance Stairs-Number of Steps 6      Prior Function   Level of Independence Independent      Cognition   Overall Cognitive Status Within Functional Limits for tasks assessed      Observation/Other Assessments-Edema    Edema --   pitting edema RLE     ROM / Strength   AROM / PROM / Strength AROM      AROM   Overall AROM Comments In standing pt appears to lack full left hip extension. Prosthesis not fitting right due to jeans so difficult to fully assess.      Transfers   Transfers Sit to Stand;Stand to Sit    Sit to Stand 5: Supervision;With upper extremity assist    Stand to Sit 5: Supervision;With upper extremity assist    Stand to Sit Details (indicate cue type and reason) Verbal cues for technique    Stand to Sit Details Pt was cued to have LLE slightly posterior to weight shift on toes to unlock prosthesis prior to sitting.      Ambulation/Gait   Ambulation/Gait Yes    Ambulation/Gait Assistance 4: Min guard    Ambulation/Gait Assistance Details Pt lack left hip extension and prosthesis sitting low due to jeans pt is wearing and socket not able to get all the way up. Pt circumducting left leg some and toe catching during swing as leg "longer" due to not sitting all the way down in socket. After gait O2 sat=93% and HR=96, RPE=5-6/10. After sitting with pursed lip breathing O2 sat back up to 96%.    Ambulation Distance (Feet) 115 Feet    Assistive device Rolling walker;Prosthesis    Gait Pattern Step-through pattern;Decreased stance time - left;Decreased hip/knee flexion - left;Left circumduction    Ambulation Surface Level;Indoor    Gait velocity 33.67 sec=0.76m/s      Standardized Balance Assessment   Standardized Balance Assessment Timed Up and Go Test      Timed Up and Go Test   TUG  Normal TUG    Normal TUG (seconds) 53             Prosthetics Assessment - 06/08/21 1113       Prosthetics   Prosthetic Care Comments  Pt currently wearing 10 ply socks. Prothesis not able to sit correctly due to jeans. Unable to sink down all the way in socket. PT advised to wear looser fitting pants or donn prosthesis prior  to putting pants on if needs to wear jeans.    Donning prosthesis  Min assist    Current prosthetic wear tolerance (days/week)  daily    Current prosthetic wear tolerance (#hours/day)  all awake hours    Residual limb condition  pt denies any issues with residual limb with skin intact.    Prosthesis Description Pt appears to have swing hydraulic knee with velcro supsension. Chris at [place] did prosthesis.                       Objective measurements completed on examination: See above findings.                PT Education - 06/08/21 1331     Education Details Prosthetic education. PT plan of care    Person(s) Educated Patient    Methods Explanation    Comprehension Verbalized understanding              PT Short Term Goals - 06/08/21 2050       PT SHORT TERM GOAL #1   Title Pt will be able to donn/doff prosthesis independently for improved mobility.    Baseline 06/08/21 Pt needs some assist to correctly donn prosthesis. Not sitting all the way down in socket at eval due to pants in way.    Time 4    Period Weeks    Status New    Target Date 07/06/21      PT SHORT TERM GOAL #2   Title Berg Balance will be performed and LTG written to further assess balance.    Baseline TBD    Time 4    Period Weeks    Status New    Target Date 07/06/21      PT SHORT TERM GOAL #3   Title Pt will decrease TUG from 53 sec to <48 sec for improved balance and functional mobility.    Baseline 06/08/21 53 sec with RW    Time 4    Period Weeks    Status New    Target Date 07/06/21      PT SHORT TERM GOAL #4   Title Pt will ambulate  >300' with RW on level surfaces for improved household and short community distances mod I.    Baseline 06/08/21 115' with RW CGA    Time 4    Period Weeks    Status New    Target Date 07/06/21               PT Long Term Goals - 06/08/21 2054       PT LONG TERM GOAL #1   Title Pt will be independent with HEP for strengthening, ROM and balance to continue gains on own. (LTGs due 08/04/21)    Baseline need to update old HEP prior to hospitalization    Time 8    Period Weeks    Status New    Target Date 08/04/21      PT LONG TERM GOAL #2   Title Pt will ambulate >400' on varied indoor and outdoor level surfaces with RW supervision for improved short community distances.    Baseline 115' with RW mod I    Time 8    Period Weeks    Status New    Target Date 08/04/21      PT LONG TERM GOAL #3   Title Pt will ambulate up/down steps with railings, up/down curb and ramp with RW supervision for improved community  access.    Baseline not performed    Time 8    Period Weeks    Status New    Target Date 08/04/21      PT LONG TERM GOAL #4   Title Pt will increase gait speed from 0.83m/s to >0.77m/s for improved household safety.    Baseline 0.61m/s on 06/08/21    Time 8    Period Weeks    Status New    Target Date 08/04/21      PT LONG TERM GOAL #5   Title Berg TBD    Baseline TBD    Time 8    Period Weeks    Status New    Target Date 08/04/21                    Plan - 06/08/21 1840     Clinical Impression Statement Pt returns to clinic after hospital stay for CHF exacerbation. Was being treated prior for left AKA prosthetic training. Pt still having some SOB with activity. O2 sats decreased to 93% after gait. There also appeared to be some discrepancies from med list and what pt reports he was told at hospital with his fluid pills. PT advised pt to follow-up with cardiologist about this. Pt was not sitting down in socket today due to jeans he was wearing which  affected his gait catching prosthetic foot at times. Pt's gait speed was 0.36m/s indicating decreased safety with household ambulation with RW. Pt is fall risk based on TUG of 53 sec. Pt will benefit from skilled PT for prosthetic training, strengthening and balance.    Personal Factors and Comorbidities Comorbidity 3+    Comorbidities MHx HTN, CHF with EF 60-65%, CAD s/p CABG, CKD stage II, Diabetes with hx of L AKA, Anemia, A fib on Eliquis, and hx of abdominal aortic aneurysm repair   09/09/20 revision left AKA    Examination-Activity Limitations Locomotion Level;Transfers;Stairs;Stand;Squat    Examination-Participation Restrictions Cleaning;Community Activity;Yard Work    Conservation officer, historic buildings Evolving/Moderate complexity    Clinical Decision Making Moderate    Rehab Potential Good    PT Frequency 2x / week   plus eval   PT Duration 8 weeks    PT Treatment/Interventions ADLs/Self Care Home Management;Cryotherapy;Moist Heat;Gait training;Stair training;Functional mobility training;Therapeutic activities;Therapeutic exercise;Balance training;Manual techniques;Prosthetic Training;Patient/family education;Neuromuscular re-education;Passive range of motion;Energy conservation;DME Instruction    PT Next Visit Plan Perform Berg balance and update LTG. Monitor vitals. O2 did desat some at eval with activity but just to 93%. Did pt talk to cardiologist to get diuretics figured out? Prosthetic training. Be sure pt can donn socket completely as was not sitting all the way in due to his pants at eval. Gait training with RW, balance training.             Patient will benefit from skilled therapeutic intervention in order to improve the following deficits and impairments:  Abnormal gait, Cardiopulmonary status limiting activity, Decreased activity tolerance, Decreased balance, Decreased mobility, Decreased knowledge of use of DME, Decreased strength, Increased edema, Decreased endurance,  Decreased range of motion, Difficulty walking  Visit Diagnosis: Other abnormalities of gait and mobility  Muscle weakness (generalized)  Abnormal posture  Unsteadiness on feet     Problem List Patient Active Problem List   Diagnosis Date Noted   Acute pulmonary edema (HCC)    Acute on chronic diastolic (congestive) heart failure (HCC) 12/08/2020   Acute on chronic diastolic CHF (congestive heart failure) (HCC) 12/07/2020  Paroxysmal A-fib (HCC) 10/10/2020   Gastritis 10/10/2020   Dyspepsia 10/07/2020   CKD stage G2/A2, GFR 60-89 and albumin creatinine ratio 30-299 mg/g 10/07/2020   Atypical chest pain 10/06/2020   Acute blood loss anemia    Essential hypertension    Postoperative pain    Severe anemia 09/19/2020   History of left above knee amputation (HCC) 09/19/2020   Left above-knee amputee (HCC) 09/19/2020   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)    CKD (chronic kidney disease), stage III (HCC) 09/01/2020   Morbid obesity (HCC) 09/01/2020   Severe sepsis with acute organ dysfunction due to Streptococcus species (HCC) 08/30/2020   Cellulitis of left leg 08/30/2020   Lactic acidosis 08/30/2020   Elevated troponin 08/30/2020   Hypoalbuminemia 08/30/2020   Leukocytosis 08/30/2020   Thrombocytopenia (HCC) 08/30/2020   Hypokalemia 08/30/2020   Hyperglycemia 08/30/2020   Transaminitis 08/30/2020   Total bilirubin, elevated 08/30/2020   AKI (acute kidney injury) (HCC) 08/30/2020   Nausea vomiting and diarrhea 08/30/2020   Hypertension 05/20/2020   Persistent atrial fibrillation (HCC)     Ronn Melena, PT, DPT, NCS 06/08/2021, 9:00 PM  [facility] [address] [place], [place], [address] Phone: [phone]   Fax:  [phone]  Name: Kamarri Gandy MRN: [id number] Date of Birth: 16-Feb-1956

## 2021-06-12 ENCOUNTER — Ambulatory Visit: Payer: Medicaid Other | Admitting: Cardiology

## 2021-06-21 ENCOUNTER — Other Ambulatory Visit: Payer: Self-pay

## 2021-06-21 ENCOUNTER — Ambulatory Visit: Payer: Medicaid Other

## 2021-06-21 VITALS — HR 88

## 2021-06-21 DIAGNOSIS — R2681 Unsteadiness on feet: Secondary | ICD-10-CM

## 2021-06-21 DIAGNOSIS — R2689 Other abnormalities of gait and mobility: Secondary | ICD-10-CM | POA: Diagnosis not present

## 2021-06-21 DIAGNOSIS — M6281 Muscle weakness (generalized): Secondary | ICD-10-CM

## 2021-06-21 NOTE — Therapy (Signed)
[facility] [address] [place], [place], [address] Phone: [phone]   Fax:  [phone]  Physical Therapy Treatment  Patient Details  Name: Connor [name] MRN: [id number] Date of Birth: Jan 25, 1956 Referring Provider (PT): Dr. Lajoyce Corners   Encounter Date: 06/21/2021   PT End of Session - 06/21/21 1102     Visit Number 2    Number of Visits 17    Date for PT Re-Evaluation 08/04/21    Authorization Type Medicaid (has used 9 visit this year so far) 10/26-12/6 12 visits    Authorization - Visit Number 1    Authorization - Number of Visits 12    PT Start Time 1100    PT Stop Time 1144    PT Time Calculation (min) 44 min    Equipment Utilized During Treatment Gait belt    Activity Tolerance Patient tolerated treatment well    Behavior During Therapy WFL for tasks assessed/performed             Past Medical History:  Diagnosis Date   AAA (abdominal aortic aneurysm)    History of open heart surgery    Hypertension    Seroma due to trauma [facility])     Past Surgical History:  Procedure Laterality Date   ABDOMINAL AORTIC ANEURYSM REPAIR     AMPUTATION Left 09/07/2020   Procedure: ATTEMPTED LEFT LEG DEBRIDEMENT FASCIOTOMIES, APPLY INSTILLATION WOUND VAC, ABOVE KNEE AMPUTATION;  Surgeon: Nadara Mustard, MD;  Location: [facility] OR;  Service: Orthopedics;  Laterality: Left;   BUBBLE STUDY  09/05/2020   Procedure: BUBBLE STUDY;  Surgeon: Meriam Sprague, MD;  Location: [facility] ENDOSCOPY;  Service: Cardiovascular;;   CARDIOVERSION N/A 02/11/2020   Procedure: CARDIOVERSION;  Surgeon: Little Ishikawa, MD;  Location: [facility] ENDOSCOPY;  Service: Cardiovascular;  Laterality: N/A;   CORONARY ARTERY BYPASS GRAFT     STUMP REVISION Left 09/09/2020   Procedure: REVISION LEFT ABOVE KNEE AMPUTATION;  Surgeon: Nadara Mustard, MD;  Location: [facility] OR;  Service: Orthopedics;  Laterality: Left;   TEE WITHOUT CARDIOVERSION N/A 09/05/2020    Procedure: TRANSESOPHAGEAL ECHOCARDIOGRAM (TEE);  Surgeon: Meriam Sprague, MD;  Location: [facility] ENDOSCOPY;  Service: Cardiovascular;  Laterality: N/A;    Vitals:   06/21/21 1103  Pulse: 88  SpO2: 96%     Subjective Assessment - 06/21/21 1103     Subjective Pt reports that he called his doctor about the lasix being wrong in bubble packs but has not heard back. He is still taking the extra out of the packs doing 80 mg 1 day and then 40 mg next day. He does feel better on days that he takes the 80mg . Also waiting on approval to get some dental work.    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    Patient Stated Goals Improve walking    Currently in Pain? Yes    Pain Score 0-No pain    Pain Location Leg    Pain Orientation Left    Pain Descriptors / Indicators Shooting    Pain Type Acute pain    Pain Onset 1 to  4 weeks ago    Pain Frequency Intermittent    Aggravating Factors  occurs at night time                               [facility] Adult PT Treatment/Exercise - 06/21/21 1106       Transfers   Transfers Sit to Stand;Stand to Sit    Sit to Stand 5: Supervision;4: Min guard    Sit to Stand Details Verbal cues for technique    Sit to Stand Details (indicate cue type and reason) Performed x 5 with cuing to step left foot slightly behind right to more easily unlock knee. Pt then able to control descent without holding to walker.      Ambulation/Gait   Ambulation/Gait Yes    Ambulation/Gait Assistance 5: Supervision;4: Min guard     Ambulation/Gait Assistance Details Verbal cues to stay up tall in walker. After gait O2 sat=92% and HR=103. With cues for pursed lip breathing increased to 95%.    Ambulation Distance (Feet) 230 Feet    Assistive device Rolling walker;Prosthesis    Gait Pattern Step-through pattern    Ambulation Surface Level;Indoor    Gait Comments Pt ambulated over red mat x 6 bouts with cues to not have foot half on/half off mat with RW.      Standardized Balance Assessment   Standardized Balance Assessment Berg Balance Test      Berg Balance Test   Sit to Stand Able to stand  independently using hands    Standing Unsupported Able to stand safely 2 minutes    Sitting with Back Unsupported but Feet Supported on Floor or Stool Able to sit safely and securely 2 minutes    Stand to Sit Controls descent by using hands    Transfers Able to transfer safely, definite need of hands    Standing Unsupported with Eyes Closed Able to stand 10 seconds safely    Standing Ubsupported with Feet Together Able to place feet together independently and stand for 1 minute with supervision    From Standing, Reach Forward with Outstretched Arm Can reach confidently >25 cm (10")    From Standing Position, Pick up Object from Floor Able to pick up shoe safely and easily    From Standing Position, Turn to Look Behind Over each Shoulder Needs supervision when turning    Turn 360 Degrees Needs close supervision or verbal cueing    Standing Unsupported, Alternately Place Feet on Step/Stool Able to complete >2 steps/needs minimal assist    Standing Unsupported, One Foot in Front Able to take small step independently and hold 30 seconds    Standing on One Leg Tries to lift leg/unable to hold 3 seconds but remains standing independently   5 seconds on the right and less than 1 second on the left   Total Score 38      Prosthetics   Prosthetic Care Comments  10 ply sock currently. Had pt tighten strap after standing a bit as heard air  movement when stepping which did improve after.    Current prosthetic wear tolerance (days/week)  daily    Current prosthetic wear tolerance (#hours/day)  6 hours    Residual limb condition  Pt denies any skin issues                       PT Short Term Goals - 06/21/21 1556  PT SHORT TERM GOAL #1   Title Pt will be able to donn/doff prosthesis independently for improved mobility.    Baseline 06/08/21 Pt needs some assist to correctly donn prosthesis. Not sitting all the way down in socket at eval due to pants in way.    Time 4    Period Weeks    Status New    Target Date 07/06/21      PT SHORT TERM GOAL #2   Title Berg Balance will be performed and LTG written to further assess balance.    Baseline 06/21/21 Berg performed and LTG written.    Time 4    Period Weeks    Status Achieved    Target Date 07/06/21      PT SHORT TERM GOAL #3   Title Pt will decrease TUG from 53 sec to <48 sec for improved balance and functional mobility.    Baseline 06/08/21 53 sec with RW    Time 4    Period Weeks    Status New    Target Date 07/06/21      PT SHORT TERM GOAL #4   Title Pt will ambulate >300' with RW on level surfaces for improved household and short community distances mod I.    Baseline 06/08/21 115' with RW CGA    Time 4    Period Weeks    Status New    Target Date 07/06/21               PT Long Term Goals - 06/21/21 1556       PT LONG TERM GOAL #1   Title Pt will be independent with HEP for strengthening, ROM and balance to continue gains on own. (LTGs due 08/04/21)    Baseline need to update old HEP prior to hospitalization    Time 8    Period Weeks    Status New      PT LONG TERM GOAL #2   Title Pt will ambulate >400' on varied indoor and outdoor level surfaces with RW supervision for improved short community distances.    Baseline 115' with RW mod I    Time 8    Period Weeks    Status New      PT LONG TERM GOAL #3   Title Pt will  ambulate up/down steps with railings, up/down curb and ramp with RW supervision for improved community access.    Baseline not performed    Time 8    Period Weeks    Status New      PT LONG TERM GOAL #4   Title Pt will increase gait speed from 0.8m/s to >0.66m/s for improved household safety.    Baseline 0.34m/s on 06/08/21    Time 8    Period Weeks    Status New      PT LONG TERM GOAL #5   Title Pt will increase Berg Balance score from 38/56 to >42/56 for improved balance and decreased fall risk.    Baseline 06/21/21 38/56    Time 8    Period Weeks    Status New                   Plan - 06/21/21 1557     Clinical Impression Statement PT assessed Connor [name] today with score of 38/56 indicating high fall risk. Pt was able to demonstrate improved gait quality with sitting down all the way in prosthesis today.    Personal Factors and Comorbidities Comorbidity 3+  Comorbidities MHx HTN, CHF with EF 60-65%, CAD s/p CABG, CKD stage II, Diabetes with hx of L AKA, Anemia, A fib on Eliquis, and hx of abdominal aortic aneurysm repair   09/09/20 revision left AKA    Examination-Activity Limitations Locomotion Level;Transfers;Stairs;Stand;Squat    Examination-Participation Restrictions Cleaning;Community Activity;Yard Work    Conservation officer, historic buildings Evolving/Moderate complexity    Rehab Potential Good    PT Frequency 2x / week   plus eval   PT Duration 8 weeks    PT Treatment/Interventions ADLs/Self Care Home Management;Cryotherapy;Moist Heat;Gait training;Stair training;Functional mobility training;Therapeutic activities;Therapeutic exercise;Balance training;Manual techniques;Prosthetic Training;Patient/family education;Neuromuscular re-education;Passive range of motion;Energy conservation;DME Instruction    PT Next Visit Plan Monitor vitals. O2 did desat some at eval with activity but just to 93%. Did pt talk to cardiologist to get diuretics figured out? Prosthetic training.   Gait training with RW, balance training.             Patient will benefit from skilled therapeutic intervention in order to improve the following deficits and impairments:  Abnormal gait, Cardiopulmonary status limiting activity, Decreased activity tolerance, Decreased balance, Decreased mobility, Decreased knowledge of use of DME, Decreased strength, Increased edema, Decreased endurance, Decreased range of motion, Difficulty walking  Visit Diagnosis: Other abnormalities of gait and mobility  Muscle weakness (generalized)  Unsteadiness on feet     Problem List Patient Active Problem List   Diagnosis Date Noted   Acute pulmonary edema (HCC)    Acute on chronic diastolic (congestive) heart failure (HCC) 12/08/2020   Acute on chronic diastolic CHF (congestive heart failure) (HCC) 12/07/2020   Paroxysmal A-fib (HCC) 10/10/2020   Gastritis 10/10/2020   Dyspepsia 10/07/2020   CKD stage G2/A2, GFR 60-89 and albumin creatinine ratio 30-299 mg/g 10/07/2020   Atypical chest pain 10/06/2020   Acute blood loss anemia    Essential hypertension    Postoperative pain    Severe anemia 09/19/2020   History of left above knee amputation (HCC) 09/19/2020   Left above-knee amputee (HCC) 09/19/2020   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)    CKD (chronic kidney disease), stage III (HCC) 09/01/2020   Morbid obesity (HCC) 09/01/2020   Severe sepsis with acute organ dysfunction due to Streptococcus species (HCC) 08/30/2020   Cellulitis of left leg 08/30/2020   Lactic acidosis 08/30/2020   Elevated troponin 08/30/2020   Hypoalbuminemia 08/30/2020   Leukocytosis 08/30/2020   Thrombocytopenia (HCC) 08/30/2020   Hypokalemia 08/30/2020   Hyperglycemia 08/30/2020   Transaminitis 08/30/2020   Total bilirubin, elevated 08/30/2020   AKI (acute kidney injury) (HCC) 08/30/2020   Nausea vomiting and diarrhea 08/30/2020   Hypertension 05/20/2020   Persistent atrial fibrillation  (HCC)     Ronn Melena, PT, DPT, NCS 06/21/2021, 4:02 PM  [facility] [address] [place], [place], [address] Phone: [phone]   Fax:  [phone]  Name: Connor [name] MRN: [id number] Date of Birth: 12-19-1955

## 2021-06-22 ENCOUNTER — Ambulatory Visit: Payer: Medicaid Other

## 2021-06-22 DIAGNOSIS — R2681 Unsteadiness on feet: Secondary | ICD-10-CM

## 2021-06-22 DIAGNOSIS — R2689 Other abnormalities of gait and mobility: Secondary | ICD-10-CM

## 2021-06-22 DIAGNOSIS — M6281 Muscle weakness (generalized): Secondary | ICD-10-CM

## 2021-06-22 NOTE — Therapy (Signed)
[facility] [address] [place], [place], [address] Phone: [phone]   Fax:  [phone]  Physical Therapy Treatment  Patient Details  Name: Connor [name] MRN: [id number] Date of Birth: [birth year] Referring Provider (PT): Dr. Lajoyce Corners   Encounter Date: 06/22/2021   PT End of Session - 06/22/21 1407     Visit Number 3    Number of Visits 17    Date for PT Re-Evaluation 08/04/21    Authorization Type Medicaid (has used 9 visit this year so far) 10/26-12/6 12 visits    Authorization - Visit Number 2    Authorization - Number of Visits 12    PT Start Time 1405    PT Stop Time 1443    PT Time Calculation (min) 38 min    Equipment Utilized During Treatment Gait belt    Activity Tolerance Patient tolerated treatment well    Behavior During Therapy WFL for tasks assessed/performed             Past Medical History:  Diagnosis Date   AAA (abdominal aortic aneurysm)    History of open heart surgery    Hypertension    Seroma due to trauma [facility])     Past Surgical History:  Procedure Laterality Date   ABDOMINAL AORTIC ANEURYSM REPAIR     AMPUTATION Left 09/07/2020   Procedure: ATTEMPTED LEFT LEG DEBRIDEMENT FASCIOTOMIES, APPLY INSTILLATION WOUND VAC, ABOVE KNEE AMPUTATION;  Surgeon: Nadara Mustard, MD;  Location: [facility] OR;  Service: Orthopedics;  Laterality: Left;   BUBBLE STUDY  09/05/2020   Procedure: BUBBLE STUDY;  Surgeon: Meriam Sprague, MD;  Location: [facility] ENDOSCOPY;  Service: Cardiovascular;;   CARDIOVERSION N/A 02/11/2020   Procedure: CARDIOVERSION;  Surgeon: Little Ishikawa, MD;  Location: [facility] ENDOSCOPY;  Service: Cardiovascular;  Laterality: N/A;   CORONARY ARTERY BYPASS GRAFT     STUMP REVISION Left 09/09/2020   Procedure: REVISION LEFT ABOVE KNEE AMPUTATION;  Surgeon: Nadara Mustard, MD;  Location: [facility] OR;  Service: Orthopedics;  Laterality: Left;   TEE WITHOUT CARDIOVERSION N/A 09/05/2020    Procedure: TRANSESOPHAGEAL ECHOCARDIOGRAM (TEE);  Surgeon: Meriam Sprague, MD;  Location: [facility] ENDOSCOPY;  Service: Cardiovascular;  Laterality: N/A;    There were no vitals filed for this visit.   Subjective Assessment - 06/22/21 1407     Subjective Pt reports that tooth is really bothering him so could hardly eat last night. Cousin made him some soup but he thinks may have had too much sodium in it as was more SOB.    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).    Patient Stated Goals Improve walking    Currently in Pain? No/denies    Pain Onset 1 to 4 weeks ago                               [facility] Adult PT Treatment/Exercise - 06/22/21 1409       Transfers   Transfers Sit to Stand;Stand to Sit    Sit to Stand With upper extremity assist;5: Supervision  Sit to Stand Details Verbal cues for technique    Sit to Stand Details (indicate cue type and reason) Pt was cued to push from w/c and lean forward.    Stand to Sit 5: Supervision    Stand to Sit Details (indicate cue type and reason) Verbal cues for technique    Stand to Sit Details Pt was cued to place LLE slightly posterior to allow him to weight shift to toe to unlock and then control descent slowly.    Comments Performed sit to stand 5 x 2.      Ambulation/Gait   Ambulation/Gait Yes    Ambulation/Gait Assistance 5: Supervision;4: Min guard    Ambulation/Gait Assistance Details Verbal cues to bring leg straight through to decrease circumduction and stand tall. Pt with better weight  shift to left side. O2 sat=93% after, RPE=5-6/10. Cued for pursed lip breathing.    Ambulation Distance (Feet) 275 Feet    Assistive device Rolling walker;Prosthesis    Gait Pattern Step-through pattern;Decreased stance time - left;Left circumduction    Ambulation Surface Level;Indoor    Gait Comments In // bars: reciprocal stepping over 3 yard sticks with bilateral UE support with cues to bring prosthesis straight forward and not circumduct x 8 bouts. Pt did catch prosthetic foot on sticks at times. In // bars walking with 1 UE support 8' x 8 with verbal and visual cues to stay up tall and increase right step length some. O2 sat monitored throughout and decreased to 93-94% with activity.                       PT Short Term Goals - 06/21/21 1556       PT SHORT TERM GOAL #1   Title Pt will be able to donn/doff prosthesis independently for improved mobility.    Baseline 06/08/21 Pt needs some assist to correctly donn prosthesis. Not sitting all the way down in socket at eval due to pants in way.    Time 4    Period Weeks    Status New    Target Date 07/06/21      PT SHORT TERM GOAL #2   Title Berg Balance will be performed and LTG written to further assess balance.    Baseline 06/21/21 Berg performed and LTG written.    Time 4    Period Weeks    Status Achieved    Target Date 07/06/21      PT SHORT TERM GOAL #3   Title Pt will decrease TUG from 53 sec to <48 sec for improved balance and functional mobility.    Baseline 06/08/21 53 sec with RW    Time 4    Period Weeks    Status New    Target Date 07/06/21      PT SHORT TERM GOAL #4   Title Pt will ambulate >300' with RW on level surfaces for improved household and short community distances mod I.    Baseline 06/08/21 115' with RW CGA    Time 4    Period Weeks    Status New    Target Date 07/06/21               PT Long Term Goals - 06/21/21 1556       PT LONG TERM GOAL #1   Title Pt will be independent  with HEP for strengthening, ROM and balance to continue gains on own. (LTGs due 08/04/21)    Baseline need to  update old HEP prior to hospitalization    Time 8    Period Weeks    Status New      PT LONG TERM GOAL #2   Title Pt will ambulate >400' on varied indoor and outdoor level surfaces with RW supervision for improved short community distances.    Baseline 115' with RW mod I    Time 8    Period Weeks    Status New      PT LONG TERM GOAL #3   Title Pt will ambulate up/down steps with railings, up/down curb and ramp with RW supervision for improved community access.    Baseline not performed    Time 8    Period Weeks    Status New      PT LONG TERM GOAL #4   Title Pt will increase gait speed from 0.34m/s to >0.19m/s for improved household safety.    Baseline 0.56m/s on 06/08/21    Time 8    Period Weeks    Status New      PT LONG TERM GOAL #5   Title Pt will increase Berg Balance score from 38/56 to >42/56 for improved balance and decreased fall risk.    Baseline 06/21/21 38/56    Time 8    Period Weeks    Status New                   Plan - 06/22/21 1853     Clinical Impression Statement Pt did well with increasing left prosthetic weight shift. Worked on decreasing UE support in // bars and trying to decrease circumduction with pt continuing to show improved upright posture.    Personal Factors and Comorbidities Comorbidity 3+    Comorbidities MHx HTN, CHF with EF 60-65%, CAD s/p CABG, CKD stage II, Diabetes with hx of L AKA, Anemia, A fib on Eliquis, and hx of abdominal aortic aneurysm repair   09/09/20 revision left AKA    Examination-Activity Limitations Locomotion Level;Transfers;Stairs;Stand;Squat    Examination-Participation Restrictions Cleaning;Community Activity;Yard Work    Conservation officer, historic buildings Evolving/Moderate complexity    Rehab Potential Good    PT Frequency 2x / week   plus eval   PT Duration 8 weeks    PT Treatment/Interventions  ADLs/Self Care Home Management;Cryotherapy;Moist Heat;Gait training;Stair training;Functional mobility training;Therapeutic activities;Therapeutic exercise;Balance training;Manual techniques;Prosthetic Training;Patient/family education;Neuromuscular re-education;Passive range of motion;Energy conservation;DME Instruction    PT Next Visit Plan Monitor vitals. O2 did desat some at eval with activity but just to 93%. Did pt talk to cardiologist to get diuretics figured out? Prosthetic training.  Gait training with RW trying to continue to decrease support to 1UE in // bars and possibly to quad cane when ready, balance training.             Patient will benefit from skilled therapeutic intervention in order to improve the following deficits and impairments:  Abnormal gait, Cardiopulmonary status limiting activity, Decreased activity tolerance, Decreased balance, Decreased mobility, Decreased knowledge of use of DME, Decreased strength, Increased edema, Decreased endurance, Decreased range of motion, Difficulty walking  Visit Diagnosis: Other abnormalities of gait and mobility  Muscle weakness (generalized)  Unsteadiness on feet     Problem List Patient Active Problem List   Diagnosis Date Noted   Acute pulmonary edema (HCC)    Acute on chronic diastolic (congestive) heart failure (HCC) 12/08/2020   Acute on chronic diastolic CHF (congestive heart failure) (HCC) 12/07/2020   Paroxysmal A-fib (HCC) 10/10/2020   Gastritis 10/10/2020  Dyspepsia 10/07/2020   CKD stage G2/A2, GFR 60-89 and albumin creatinine ratio 30-299 mg/g 10/07/2020   Atypical chest pain 10/06/2020   Acute blood loss anemia    Essential hypertension    Postoperative pain    Severe anemia 09/19/2020   History of left above knee amputation (HCC) 09/19/2020   Left above-knee amputee (HCC) 09/19/2020   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)    CKD (chronic kidney disease), stage III (HCC) 09/01/2020    Morbid obesity (HCC) 09/01/2020   Severe sepsis with acute organ dysfunction due to Streptococcus species (HCC) 08/30/2020   Cellulitis of left leg 08/30/2020   Lactic acidosis 08/30/2020   Elevated troponin 08/30/2020   Hypoalbuminemia 08/30/2020   Leukocytosis 08/30/2020   Thrombocytopenia (HCC) 08/30/2020   Hypokalemia 08/30/2020   Hyperglycemia 08/30/2020   Transaminitis 08/30/2020   Total bilirubin, elevated 08/30/2020   AKI (acute kidney injury) (HCC) 08/30/2020   Nausea vomiting and diarrhea 08/30/2020   Hypertension 05/20/2020   Persistent atrial fibrillation (HCC)     Ronn Melena, PT, DPT, NCS 06/22/2021, 6:55 PM  [facility] [address] [place], [place], [address] Phone: [phone]   Fax:  [phone]  Name: Kyrus Hyde MRN: [id number] Date of Birth: 1956/08/09

## 2021-06-22 NOTE — Progress Notes (Deleted)
Cardiology Office Note:    Date:  06/22/2021   ID:  Connor [name], DOB 1956/04/03, MRN [id number]  PCP:  Connor Grout, FNP  Cardiologist:  Connor Ishikawa, MD  Electrophysiologist:  None   Referring MD: Connor Grout, FNP   No chief complaint on file.   History of Present Illness:    Connor [name] is a 65 y.o. male with a hx of aortic dissection who presents for follow-up.  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  He underwent left common and external iliac artery and left renal vein stents on 09/19/2018 in preparation for total aortic arch repair.  Course was complicated by cardiogenic shock (LVEF down to 15%, which has since recovered), AKI requiring CVVH (with subsequent recovery of renal function), extensive left upper extremity DVT.  On 10/03/2018, he underwent total arch replacement, frozen elephant trunk, and head vessel reconstruction with Dr.Takayama and Dr. Allena [name].  Course was complicated by sternal wound infection, with cultures growing Klebsiella.  He completed a 14-day course of cefadroxil.  He was following closely with cardiology and nephrology in [place] for blood pressure control.  Moved to [place] in July to help with a family issue.   He was in the ED on 06/19/2019 with chest tightness and shortness of breath.  Troponins negative x2.  CTA chest showed patent graft of the ascending aortic component, patent graft of the proximal descending aorta with, thrombosis of the proximal descending thoracic aortic dissection false lumen along the stent graft, patent type B aortic dissection of the mid descending thoracic aorta extending to the abdominal aortic bifurcation.  Reports that he feels chest pain has been related to muscle cramps from low potassium.  Now taking potassium supplements.  States that he also has chest pain across the incision in the right upper chest when he  does stretches.  TTE on 10/02/2019 showed EF 60-65%, severe LVH, normal RV function, no significant valvular disease.  At clinic visit on 01/05/2020, he was found to be in atrial fibrillation.  Underwent successful DCCV on 02/11/2020.  He was admitted to [facility] from 1/4 through 09/19/2020 with necrotizing fasciitis of the left flaying and strep bacteremia.  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  Echocardiogram 09/01/2020 showed EF 60 to 65%, severe LVH, grade 2 diastolic dysfunction, normal RV function, severe left atrial dilatation, moderate right atrial dilatation, no significant valvular disease.  TEE on 09/05/2020 showed no vegetations, positive bubble study.  He was admitted 11/2020 with uncontrolled hypertension and acute on chronic diastolic heart failure, improved with IV Lasix.  He was admitted in June 2022 to [facility] with type B aortic dissection and hypertensive crisis.  On 02/06/2021 he underwent TEVAR extension and left renal artery stenting.  Since his last clinic visit, he reports that he has been doing okay.  Main complaint has been feeling fatigued.  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  Reports intermittent edema.   Denies any chest pain or dyspnea.  Reports BP has been up to 150s when he checks it at home but did not bring log in today.  Has been taking Eliquis, denies any bleeding issues.   Past Medical History:  Diagnosis Date   AAA (abdominal aortic aneurysm)    History of open heart surgery    Hypertension    Seroma due to trauma (  [facility])     Past Surgical History:  Procedure Laterality Date   ABDOMINAL AORTIC ANEURYSM REPAIR     AMPUTATION Left 09/07/2020   Procedure: ATTEMPTED LEFT LEG DEBRIDEMENT FASCIOTOMIES, APPLY INSTILLATION WOUND VAC, ABOVE KNEE AMPUTATION;  Surgeon: Connor Mustard, MD;  Location: [facility] OR;  Service: Orthopedics;  Laterality: Left;   BUBBLE STUDY  09/05/2020   Procedure: BUBBLE STUDY;  Surgeon: Connor Sprague, MD;   Location: [facility] ENDOSCOPY;  Service: Cardiovascular;;   CARDIOVERSION N/A 02/11/2020   Procedure: CARDIOVERSION;  Surgeon: Connor Ishikawa, MD;  Location: [facility] ENDOSCOPY;  Service: Cardiovascular;  Laterality: N/A;   CORONARY ARTERY BYPASS GRAFT     STUMP REVISION Left 09/09/2020   Procedure: REVISION LEFT ABOVE KNEE AMPUTATION;  Surgeon: Connor Mustard, MD;  Location: [facility] OR;  Service: Orthopedics;  Laterality: Left;   TEE WITHOUT CARDIOVERSION N/A 09/05/2020   Procedure: TRANSESOPHAGEAL ECHOCARDIOGRAM (TEE);  Surgeon: Connor Sprague, MD;  Location: [facility] ENDOSCOPY;  Service: Cardiovascular;  Laterality: N/A;    Current Medications: No outpatient medications have been marked as taking for the 06/23/21 encounter (Appointment) with Connor Ishikawa, MD.     Allergies:   Patient has no known allergies.   Social History   Socioeconomic History   Marital status: Single    Spouse name: Not on file   Number of children: Not on file   Years of education: Not on file   Highest education level: Not on file  Occupational History   Not on file  Tobacco Use   Smoking status: Every Day    Packs/day: 0.50    Types: Cigarettes   Smokeless tobacco: Never  Vaping Use   Vaping Use: Never used  Substance and Sexual Activity   Alcohol use: Never   Drug use: Never   Sexual activity: Not on file  Other Topics Concern   Not on file  Social History Narrative   Not on file   Social Determinants of Health   Financial Resource Strain: Not on file  Food Insecurity: Not on file  Transportation Needs: Not on file  Physical Activity: Not on file  Stress: Not on file  Social Connections: Not on file     Family History: Mother died of MI at age 26.  Father had stroke at age 32  ROS:   Please see the history of present illness.     All other systems reviewed and are negative.  EKGs/Labs/Other Studies Reviewed:    The following studies were reviewed today:   EKG:  EKG is   ordered today.  The ekg ordered today demonstrates atrial fibrillation, rate 74, incomplete right bundle branch block  TTE 10/02/19:  1. Left ventricular ejection fraction, by visual estimation, is 60 to  65%. The left ventricle has normal function. Left ventricular septal wall  thickness was severely increased. Severely increased left ventricular  posterior wall thickness. There is  severely increased left ventricular hypertrophy.   2. Left ventricular diastolic function could not be evaluated.   3. The left ventricle has no regional wall motion abnormalities.   4. Global right ventricle has normal systolic function.The right  ventricular size is normal. No increase in right ventricular wall  thickness.   5. Left atrial size was moderately dilated.   6. Right atrial size was normal.   7. The mitral valve is normal in structure. Trivial mitral valve  regurgitation. No evidence of mitral stenosis.   8. The tricuspid valve is normal in structure.  9. The tricuspid valve is normal in structure. Tricuspid valve  regurgitation is not demonstrated.  10. The aortic valve is tricuspid. Aortic valve regurgitation is not  visualized. No evidence of aortic valve sclerosis or stenosis.  11. The pulmonic valve was normal in structure. Pulmonic valve  regurgitation is not visualized.  12. The inferior vena cava is normal in size with greater than 50%  respiratory variability, suggesting right atrial pressure of 3 mmHg.   Recent Labs: 12/09/2020: Magnesium 1.9 02/04/2021: ALT 20; B Natriuretic Peptide 267.0; BUN 15; Creatinine, Ser 1.05; Hemoglobin 9.6; Platelets 153; Potassium 2.9; Sodium 139  Recent Lipid Panel    Component Value Date/Time   CHOL 110 09/04/2020 1446   TRIG 173 (H) 09/04/2020 1446   HDL <10 (L) 09/04/2020 1446   CHOLHDL NOT CALCULATED 09/04/2020 1446   VLDL 35 09/04/2020 1446   LDLCALC NOT CALCULATED 09/04/2020 1446    Physical Exam:    VS:  There were no vitals taken for  this visit.    Wt Readings from Last 3 Encounters:  03/30/21 224 lb (101.6 kg)  03/25/21 241 lb (109.3 kg)  02/04/21 245 lb (111.1 kg)     GEN: Well nourished, well developed in no acute distress HEENT: Normal NECK: No JVD CARDIAC: irregular, 2/6 systolic murmur RESPIRATORY:  Clear to auscultation without rales, wheezing or rhonchi  ABDOMEN: Soft, non-tender, non-distended MUSCULOSKELETAL:  1+ edema SKIN: Warm and dry NEUROLOGIC:  Alert and oriented x 3 PSYCHIATRIC:  Normal affect   ASSESSMENT:    No diagnosis found.  PLAN:    Aortic dissection: Type A aortic arch dissection status post repair in 2016 with dissection at anastomosis suture lines in 07/2018 status post left common and external iliac artery and left renal vein stents on 09/19/2018 and then total arch replacement, frozen elephant trunk, head vessel reconstruction on 10/03/2018 in [place].  He was admitted in June 2022 to [facility] with type B aortic dissection and hypertensive crisis.  On 02/06/2021 he underwent TEVAR extension and left renal artery stenting. -Follows with [facility] vascular surgery -Goal SBP less than 120, heart rate less than 60.    Chronic atrial fibrillation: At clinic visit on 01/05/2020, he was found to be in atrial fibrillation.  Underwent successful DCCV on 02/11/2020, but subsequently back in AF.  Rates appear well controlled.  CHADS-VASc score 2 given HTN, vascular disease. TTE shows normal LVEF -Continue Eliquis 5 mg twice daily -Continue diltiazem  Hypertension: Currently on spironolactone 25 mg daily, lisinopril 20 mg daily, hydralazine 50 mg 3 times daily, diltiazem 240 mg daily, Imdur 30 mg daily, Lasix 40 mg daily, labetalol 400 mg twice daily. -BP above goal, will check BMP and if stable renal function/creatinine likely increase spironolactone. -Referred to hypertension clinic for evaluation -Given resistant hypertension and also issues with persistent hypokalemia, concern for hyperaldosteronism.   Renin/aldosterone ratio >60, have referred to endocrinology and started on spironolactone.  He was seen by endocrinology during admission at [facility] in June 2022, agreed likely had hyperaldosteronism and recommended titrating up spironolactone to 100 mg daily -Suspect untreated OSA also contributing to resistant hypertension, sleep study ordered  Snoring: Evaluate for OSA with sleep study as above  RTC in ***  Medication Adjustments/Labs and Tests Ordered: Current medicines are reviewed at length with the patient today.  Concerns regarding medicines are outlined above.  No orders of the defined types were placed in this encounter.  No orders of the defined types were placed in this encounter.  There are no Patient Instructions on file for this visit.   Signed, Connor Ishikawa, MD  06/22/2021 9:34 PM    [facility]

## 2021-06-23 ENCOUNTER — Ambulatory Visit: Payer: Medicaid Other | Admitting: Cardiology

## 2021-06-28 ENCOUNTER — Ambulatory Visit: Payer: Medicaid Other | Attending: Orthopedic Surgery

## 2021-06-28 ENCOUNTER — Other Ambulatory Visit: Payer: Self-pay

## 2021-06-28 VITALS — HR 85

## 2021-06-28 DIAGNOSIS — R293 Abnormal posture: Secondary | ICD-10-CM | POA: Insufficient documentation

## 2021-06-28 DIAGNOSIS — R2681 Unsteadiness on feet: Secondary | ICD-10-CM | POA: Diagnosis present

## 2021-06-28 DIAGNOSIS — M6281 Muscle weakness (generalized): Secondary | ICD-10-CM | POA: Insufficient documentation

## 2021-06-28 DIAGNOSIS — R2689 Other abnormalities of gait and mobility: Secondary | ICD-10-CM | POA: Insufficient documentation

## 2021-06-28 NOTE — Therapy (Signed)
[facility] [address] [place], [place], [address] Phone: [phone]   Fax:  [phone]  Physical Therapy Treatment  Patient Details  Name: Connor [name] MRN: [id number] Date of Birth: [birth year] Referring Provider (PT): Dr. Sharol Given   Encounter Date: 06/28/2021   PT End of Session - 06/28/21 1100     Visit Number 4    Number of Visits 17    Date for PT Re-Evaluation 08/04/21    Authorization Type Medicaid (has used 9 visit this year so far) 10/26-12/6 12 visits    Authorization - Visit Number 3    Authorization - Number of Visits 12    PT Start Time 1100    PT Stop Time 1141    PT Time Calculation (min) 41 min    Equipment Utilized During Treatment Gait belt    Activity Tolerance Patient tolerated treatment well    Behavior During Therapy WFL for tasks assessed/performed             Past Medical History:  Diagnosis Date   AAA (abdominal aortic aneurysm)    History of open heart surgery    Hypertension    Seroma due to trauma [facility])     Past Surgical History:  Procedure Laterality Date   ABDOMINAL AORTIC ANEURYSM REPAIR     AMPUTATION Left 09/07/2020   Procedure: ATTEMPTED LEFT LEG DEBRIDEMENT FASCIOTOMIES, APPLY INSTILLATION WOUND VAC, ABOVE KNEE AMPUTATION;  Surgeon: Newt Minion, MD;  Location: [place];  Service: Orthopedics;  Laterality: Left;   BUBBLE STUDY  09/05/2020   Procedure: BUBBLE STUDY;  Surgeon: Freada Bergeron, MD;  Location: [place];  Service: Cardiovascular;;   CARDIOVERSION N/A 02/11/2020   Procedure: CARDIOVERSION;  Surgeon: Donato Heinz, MD;  Location: [place];  Service: Cardiovascular;  Laterality: N/A;   CORONARY ARTERY BYPASS GRAFT     STUMP REVISION Left 09/09/2020   Procedure: REVISION LEFT ABOVE KNEE AMPUTATION;  Surgeon: Newt Minion, MD;  Location: [place];  Service: Orthopedics;  Laterality: Left;   TEE WITHOUT CARDIOVERSION N/A 09/05/2020    Procedure: TRANSESOPHAGEAL ECHOCARDIOGRAM (TEE);  Surgeon: Freada Bergeron, MD;  Location: [facility] ENDOSCOPY;  Service: Cardiovascular;  Laterality: N/A;    Vitals:   06/28/21 1104  Pulse: 85  SpO2: 98%     Subjective Assessment - 06/28/21 1101     Subjective Pt reports that he was really tired after having the back to back visits and he slept past his cardiologist appointment. He has rescheduled to the end of the month hopefully but not definite. Still feeling a little winded but not as bad as last time. Running low on fluid pills. Pt wants to cut back on therapy due to copay. [facility] is [facility] to senior center working on arm strength. Also has a [employer] but was busy yesterday.    Pertinent History atrial fibrillation (on eliquis), CHF, AKI (previously requiring CRRT and has since recovered), s/p left AKA for infection and a type A aortic dissection s/p open repair (2016 at [facility]) with recurrence at the distal anastomosis s/p frozen elephant trunk and total arch replacement (2019, [facility]), left renal artery and left common and external iliac stenting (08/2018), TEVAR extension and LRA stenting via bilateral CFA on 02/06/21 [facility]).    Patient Stated Goals Improve walking    Currently in Pain? No/denies    Pain Onset 1 to 4 weeks ago  [place] Adult PT Treatment/Exercise - 06/28/21 1105       Transfers   Transfers Sit to Stand;Stand to Sit    Sit to Stand 5: Supervision;With upper extremity assist    Stand to Sit 5: Supervision    Stand to Sit Details (indicate cue type and reason) Verbal cues for technique      Ambulation/Gait   Ambulation/Gait Yes    Ambulation/Gait Assistance 5: Supervision;4: Min guard;3: Mod assist    Ambulation/Gait Assistance Details With RW initially supervision. O2 sat=90% after gait. Increased to 96% after sitting a couple minuted with pursed lip breatihng.    Ambulation Distance (Feet) 230 Feet   115'  with SBQC CGA of 2 therapists for safety with 2 bouts of mod assist to regain balance initially when lost balance. Improved as went on with sequencing. O2 sat=93%, RPE=5/10 and HR=100 after quad cane. Pt reported not feeling confident with it.   Assistive device Rolling walker;Prosthesis    Gait Pattern Step-through pattern;Decreased stance time - left    Ambulation Surface Level;Indoor    Stairs Yes    Stairs Assistance 5: Supervision    Stairs Assistance Details (indicate cue type and reason) Verbal cues to squeeze left glut to help maintain knee extension.    Stair Management Technique Step to pattern;Two rails    Number of Stairs 12    Height of Stairs 6    Gait Comments In // bars: walking with 1 UE support 8' x 6 with cues for upright posture. O2 sat 93% after.      Neuro Re-ed    Neuro Re-ed Details  In // bars: standing on airex with feet together eyes closed 30 sec x 2 CGA, feet together with head turns up/down x 10, left/right x 10. CGA for safety. Increased sway with eyes closed and up/down head turns. Standing on rockerboard positioned ant/post trying to maintain level x 1 min CGA. 1 bout of prosthetic knee flexion but pt able to recover on own.      Prosthetics   Current prosthetic wear tolerance (days/week)  daily    Current prosthetic wear tolerance (#hours/day)  all awake hours    Residual limb condition  pt denies any skin issues                       PT Short Term Goals - 06/21/21 1556       PT SHORT TERM GOAL #1   Title Pt will be able to donn/doff prosthesis independently for improved mobility.    Baseline 06/08/21 Pt needs some assist to correctly donn prosthesis. Not sitting all the way down in socket at eval due to pants in way.    Time 4    Period Weeks    Status New    Target Date 07/06/21      PT SHORT TERM GOAL #2   Title Berg Balance will be performed and LTG written to further assess balance.    Baseline 06/21/21 Berg performed and LTG  written.    Time 4    Period Weeks    Status Achieved    Target Date 07/06/21      PT SHORT TERM GOAL #3   Title Pt will decrease TUG from 53 sec to <48 sec for improved balance and functional mobility.    Baseline 06/08/21 53 sec with RW    Time 4    Period Weeks    Status New    Target Date  07/06/21      PT SHORT TERM GOAL #4   Title Pt will ambulate >300' with RW on level surfaces for improved household and short community distances mod I.    Baseline 06/08/21 115' with RW CGA    Time 4    Period Weeks    Status New    Target Date 07/06/21               PT Long Term Goals - 06/21/21 1556       PT LONG TERM GOAL #1   Title Pt will be independent with HEP for strengthening, ROM and balance to continue gains on own. (LTGs due 08/04/21)    Baseline need to update old HEP prior to hospitalization    Time 8    Period Weeks    Status New      PT LONG TERM GOAL #2   Title Pt will ambulate >400' on varied indoor and outdoor level surfaces with RW supervision for improved short community distances.    Baseline 115' with RW mod I    Time 8    Period Weeks    Status New      PT LONG TERM GOAL #3   Title Pt will ambulate up/down steps with railings, up/down curb and ramp with RW supervision for improved community access.    Baseline not performed    Time 8    Period Weeks    Status New      PT LONG TERM GOAL #4   Title Pt will increase gait speed from 0.28m/s to >0.36m/s for improved household safety.    Baseline 0.11m/s on 06/08/21    Time 8    Period Weeks    Status New      PT LONG TERM GOAL #5   Title Pt will increase Berg Balance score from 38/56 to >42/56 for improved balance and decreased fall risk.    Baseline 06/21/21 38/56    Time 8    Period Weeks    Status New                   Plan - 06/28/21 1257     Clinical Impression Statement Pt continued to work on gait with decreasing UE support. Did well in // bars with 1 UE support. Trialed  quad cane with 2 person assist for safety. Pt less confident with quad cane and fatigued quicker. Decreased frequency to 1x/week due to copay and pt request.    Personal Factors and Comorbidities Comorbidity 3+    Comorbidities MHx HTN, CHF with EF 60-65%, CAD s/p CABG, CKD stage II, Diabetes with hx of L AKA, Anemia, A fib on Eliquis, and hx of abdominal aortic aneurysm repair   09/09/20 revision left AKA    Examination-Activity Limitations Locomotion Level;Transfers;Stairs;Stand;Squat    Examination-Participation Restrictions Cleaning;Community Activity;Yard Work    Conservation officer, historic buildings Evolving/Moderate complexity    Rehab Potential Good    PT Frequency 2x / week   plus eval   PT Duration 8 weeks    PT Treatment/Interventions ADLs/Self Care Home Management;Cryotherapy;Moist Heat;Gait training;Stair training;Functional mobility training;Therapeutic activities;Therapeutic exercise;Balance training;Manual techniques;Prosthetic Training;Patient/family education;Neuromuscular re-education;Passive range of motion;Energy conservation;DME Instruction    PT Next Visit Plan Check STGs next visit. Monitor vitals. O2 did desat some at eval with activity but just to 93%. Did pt talk to cardiologist to get diuretics figured out? Prosthetic training.  Gait training with RW trying to continue to decrease support to 1UE in //  bars and possibly to quad cane when ready, balance training.             Patient will benefit from skilled therapeutic intervention in order to improve the following deficits and impairments:  Abnormal gait, Cardiopulmonary status limiting activity, Decreased activity tolerance, Decreased balance, Decreased mobility, Decreased knowledge of use of DME, Decreased strength, Increased edema, Decreased endurance, Decreased range of motion, Difficulty walking  Visit Diagnosis: Other abnormalities of gait and mobility  Muscle weakness (generalized)  Unsteadiness on  feet     Problem List Patient Active Problem List   Diagnosis Date Noted   Acute pulmonary edema (HCC)    Acute on chronic diastolic (congestive) heart failure ([place]) 12/08/2020   Acute on chronic diastolic CHF (congestive heart failure) ([place]) 12/07/2020   Paroxysmal A-fib (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 ([place])    CKD (chronic kidney disease), stage III ([place]) 09/01/2020   Morbid obesity ([place]) 09/01/2020   Severe sepsis with acute organ dysfunction due to Streptococcus species ([place]) 08/30/2020   Cellulitis of left leg 08/30/2020   Lactic acidosis 08/30/2020   Elevated troponin 08/30/2020   Hypoalbuminemia 08/30/2020   Leukocytosis 08/30/2020   Thrombocytopenia ([place]) 08/30/2020   Hypokalemia 08/30/2020   Hyperglycemia 08/30/2020   Transaminitis 08/30/2020   Total bilirubin, elevated 08/30/2020   AKI (acute kidney injury) ([place]) 08/30/2020   Nausea vomiting and diarrhea 08/30/2020   Hypertension 05/20/2020   Persistent atrial fibrillation ([place])     Electa Sniff, PT, DPT, NCS 06/28/2021, 1:00 PM  [facility] [address] [place], [place], [address] Phone: [phone]   Fax:  [phone]  Name: Connor [name] MRN: [id number] Date of Birth: 1955/11/07

## 2021-06-30 ENCOUNTER — Ambulatory Visit: Payer: Medicaid Other

## 2021-07-05 ENCOUNTER — Other Ambulatory Visit: Payer: Self-pay

## 2021-07-05 ENCOUNTER — Ambulatory Visit: Payer: Medicaid Other

## 2021-07-05 VITALS — HR 61

## 2021-07-05 DIAGNOSIS — R2689 Other abnormalities of gait and mobility: Secondary | ICD-10-CM

## 2021-07-05 DIAGNOSIS — R2681 Unsteadiness on feet: Secondary | ICD-10-CM

## 2021-07-05 DIAGNOSIS — M6281 Muscle weakness (generalized): Secondary | ICD-10-CM

## 2021-07-05 DIAGNOSIS — R293 Abnormal posture: Secondary | ICD-10-CM

## 2021-07-05 NOTE — Therapy (Signed)
[facility] [address] [place], [place], [address] Phone: [phone]   Fax:  [phone]  Physical Therapy Treatment  Patient Details  Name: Connor [name] MRN: [id number] Date of Birth: 1955-10-20 Referring Provider (PT): Dr. Lajoyce Corners   Encounter Date: 07/05/2021   PT End of Session - 07/05/21 1338     Visit Number 5    Number of Visits 17    Date for PT Re-Evaluation 08/04/21    Authorization Type Medicaid (has used 9 visit this year so far) 10/26-12/6 12 visits    Authorization - Visit Number 4    Authorization - Number of Visits 12    PT Start Time 1105    PT Stop Time 1145    PT Time Calculation (min) 40 min    Equipment Utilized During Treatment Gait belt    Activity Tolerance Patient tolerated treatment well    Behavior During Therapy WFL for tasks assessed/performed             Past Medical History:  Diagnosis Date   AAA (abdominal aortic aneurysm)    History of open heart surgery    Hypertension    Seroma due to trauma [facility])     Past Surgical History:  Procedure Laterality Date   ABDOMINAL AORTIC ANEURYSM REPAIR     AMPUTATION Left 09/07/2020   Procedure: ATTEMPTED LEFT LEG DEBRIDEMENT FASCIOTOMIES, APPLY INSTILLATION WOUND VAC, ABOVE KNEE AMPUTATION;  Surgeon: Nadara Mustard, MD;  Location: [facility] OR;  Service: Orthopedics;  Laterality: Left;   BUBBLE STUDY  09/05/2020   Procedure: BUBBLE STUDY;  Surgeon: Meriam Sprague, MD;  Location: [facility] ENDOSCOPY;  Service: Cardiovascular;;   CARDIOVERSION N/A 02/11/2020   Procedure: CARDIOVERSION;  Surgeon: Little Ishikawa, MD;  Location: [facility] ENDOSCOPY;  Service: Cardiovascular;  Laterality: N/A;   CORONARY ARTERY BYPASS GRAFT     STUMP REVISION Left 09/09/2020   Procedure: REVISION LEFT ABOVE KNEE AMPUTATION;  Surgeon: Nadara Mustard, MD;  Location: [facility] OR;  Service: Orthopedics;  Laterality: Left;   TEE WITHOUT CARDIOVERSION N/A 09/05/2020    Procedure: TRANSESOPHAGEAL ECHOCARDIOGRAM (TEE);  Surgeon: Meriam Sprague, MD;  Location: [facility] ENDOSCOPY;  Service: Cardiovascular;  Laterality: N/A;    Vitals:   07/05/21 1113  Pulse: 61  SpO2: 97%     Subjective Assessment - 07/05/21 1111     Subjective Pt reports that his tooth is feeling about the same. He reports that he is taking his lasixs at night now but it is causing him to go to the bathroom more throughout the night. Pt reports that the swelling in his leg is doing the same.    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    Patient Stated Goals Improve walking    Currently in Pain? Yes    Pain Score 2     Pain Location Teeth    Pain Descriptors / Indicators Aching    Pain Type Chronic pain    Pain Onset 1 to 4 weeks ago  [facility] Adult PT Treatment/Exercise - 07/05/21 1339       Transfers   Transfers Sit to Stand;Stand to [employer] to Stand 5: Supervision    Stand to Sit 5: Supervision    Stand to Sit Details (indicate cue type and reason) Verbal cues for technique      Ambulation/Gait   Ambulation/Gait Yes    Ambulation/Gait Assistance 5: Supervision;4: Min guard    Ambulation/Gait Assistance Details With RW for 345' O2 sat dropped to 93% post walking increased back to 96 after a couple of minutes of pursed lipped breathing.    Ambulation Distance (Feet) 345 Feet    Assistive device Rolling walker    Gait Pattern Step-through pattern;Decreased stance time - left    Ambulation Surface  Level;Indoor    Gait Comments In // walking with single UE support, 2 sets of 3 laps. Pt intially was leaning over RUE while holding on to // for the first set. PT then provided verbal cue pt to just slide hand on top of // for better posture and weight shift in gait. PT provided min guard.      Balance   Balance Assessed Yes      Standardized Balance Assessment   Standardized Balance Assessment Timed Up and Go Test      Timed Up and Go Test   TUG Normal TUG    Normal TUG (seconds) 38.83   Average of two trials, pt demonstrated slight difficulty in turning around during trial, used RW     Therapeutic Activites    Therapeutic Activities Other Therapeutic Activities    Other Therapeutic Activities PT noted pt's prosthesis was dragging during gait and pt stated that he felt like he was in leg completely. PT assisted pt in room in properly donning leg as the pt's briefs were in his liner not allowing the leg to properly fit. Pt reported not wearing shrinker last night. PT educated pt on importance of shrinker usage to allow leg to fit correctly and suggested the pt remove one of the 5 ply socks off. Pt reported improved fit of prothesis after adjustments and gait improved w/ less LLE drag.      Prosthetics   Current prosthetic wear tolerance (days/week)  daily    Current prosthetic wear tolerance (#hours/day)  all awake hours    Residual limb condition  pt denies any skin issues                       PT Short Term Goals - 07/05/21 1350       PT SHORT TERM GOAL #1   Title Pt will be able to donn/doff prosthesis independently for improved mobility.    Baseline 06/08/21 Pt needs some assist to correctly donn prosthesis. Not sitting all the way down in socket at eval due to pants in way. 07/05/21 Pt still needed some help with correcting porthesis fitment, educated on proper shrinkage use and corrected number of socks being worn to improve fit.    Time 4    Period Weeks    Status  On-going    Target Date 07/06/21      PT SHORT TERM GOAL #2   Title Berg Balance will be performed and LTG written to further assess balance.    Baseline 06/21/21 Berg performed and LTG written.    Time 4    Period Weeks    Status Achieved    Target Date 07/06/21  PT SHORT TERM GOAL #3   Title Pt will decrease TUG from 53 sec to <48 sec for improved balance and functional mobility.    Baseline 06/08/21 53 sec with RW, 07/05/21 38.83 average between two trials with RW    Time 4    Period Weeks    Status Achieved    Target Date 07/06/21      PT SHORT TERM GOAL #4   Title Pt will ambulate >300' with RW on level surfaces for improved household and short community distances mod I.    Baseline 06/08/21 115' with RW CGA, 07/05/21 345' w/ RW    Time 4    Period Weeks    Status Achieved    Target Date 07/06/21               PT Long Term Goals - 06/21/21 1556       PT LONG TERM GOAL #1   Title Pt will be independent with HEP for strengthening, ROM and balance to continue gains on own. (LTGs due 08/04/21)    Baseline need to update old HEP prior to hospitalization    Time 8    Period Weeks    Status New      PT LONG TERM GOAL #2   Title Pt will ambulate >400' on varied indoor and outdoor level surfaces with RW supervision for improved short community distances.    Baseline 115' with RW mod I    Time 8    Period Weeks    Status New      PT LONG TERM GOAL #3   Title Pt will ambulate up/down steps with railings, up/down curb and ramp with RW supervision for improved community access.    Baseline not performed    Time 8    Period Weeks    Status New      PT LONG TERM GOAL #4   Title Pt will increase gait speed from 0.29m/s to >0.9m/s for improved household safety.    Baseline 0.31m/s on 06/08/21    Time 8    Period Weeks    Status New      PT LONG TERM GOAL #5   Title Pt will increase Berg Balance score from 38/56 to >42/56 for improved balance and decreased fall  risk.    Baseline 06/21/21 38/56    Time 8    Period Weeks    Status New                   Plan - 07/05/21 1353     Clinical Impression Statement Today's PT session focused on checking pt's STG. Pt was able to achieve STG demonstrating improvements in gait, functional mobility, and balance. Pt was able to lower their TUG time to 38.83 seconds demonstrating a decrease in fall risk and was able to ambulate 345' demonstrating improved endurance. Pt required some assitance in correcting fit of prothesis during today's session possibly due to increased swelling due to the pt not wearing their shrinker last night. PT educated pt on importance of shrinker use for proper prosthesis fit. PT still required to continue to progress pt towards achieving LTG.    Personal Factors and Comorbidities Comorbidity 3+    Comorbidities MHx HTN, CHF with EF 60-65%, CAD s/p CABG, CKD stage II, Diabetes with hx of L AKA, Anemia, A fib on Eliquis, and hx of abdominal aortic aneurysm repair   09/09/20 revision left AKA    Examination-Activity Limitations Locomotion Level;Transfers;Stairs;Stand;Squat  Examination-Participation Restrictions Cleaning;Community Activity;Yard Work    [employer]chant [employer] officer Evolving/Moderate complexity    Rehab Potential Good    PT Frequency 2x / week   plus eval   PT Duration 8 weeks    PT Treatment/Interventions ADLs/Self Care Home Management;Cryotherapy;Moist Heat;Gait training;Stair training;Functional mobility training;Therapeutic activities;Therapeutic exercise;Balance training;Manual techniques;Prosthetic Training;Patient/family education;Neuromuscular re-education;Passive range of motion;Energy conservation;DME Instruction    PT Next Visit Plan Check prothesis fit at beginning of next session. Monitor vitals. O2 did desat some at eval with activity but just to 93%. Did pt talk to cardiologist to get diuretics figured out? Prosthetic training.  Gait training with RW  trying to continue to decrease support to 1UE in // bars and possibly to quad cane when ready, balance training.             Patient will benefit from skilled therapeutic intervention in order to improve the following deficits and impairments:  Abnormal gait, Cardiopulmonary status limiting activity, Decreased activity tolerance, Decreased balance, Decreased mobility, Decreased knowledge of use of DME, Decreased strength, Increased edema, Decreased endurance, Decreased range of motion, Difficulty walking  Visit Diagnosis: Other abnormalities of gait and mobility  Muscle weakness (generalized)  Unsteadiness on feet  Abnormal posture     Problem List Patient Active Problem List   Diagnosis Date Noted   Acute pulmonary edema (HCC)    Acute on chronic diastolic (congestive) heart failure ([place]) 12/08/2020   Acute on chronic diastolic CHF (congestive heart failure) ([place]) 12/07/2020   Paroxysmal A-fib ([place]) 10/10/2020   Gastritis 10/10/2020   Dyspepsia 10/07/2020   CKD stage G2/A2, GFR 60-89 and albumin creatinine ratio 30-299 mg/g 10/07/2020   Atypical chest pain 10/06/2020   Acute blood loss anemia    Essential hypertension    Postoperative pain    Severe anemia 09/19/2020   History of left above knee amputation ([place]) 09/19/2020   Left above-knee amputee ([place]) 09/19/2020   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 ([place])    CKD (chronic kidney disease), stage III ([place]) 09/01/2020   Morbid obesity ([place]) 09/01/2020   Severe sepsis with acute organ dysfunction due to Streptococcus species ([place]) 08/30/2020   Cellulitis of left leg 08/30/2020   Lactic acidosis 08/30/2020   Elevated troponin 08/30/2020   Hypoalbuminemia 08/30/2020   Leukocytosis 08/30/2020   Thrombocytopenia ([place]) 08/30/2020   Hypokalemia 08/30/2020   Hyperglycemia 08/30/2020   Transaminitis 08/30/2020   Total bilirubin, elevated 08/30/2020   AKI (acute kidney injury) ([place]) 08/30/2020   Nausea  vomiting and diarrhea 08/30/2020   Hypertension 05/20/2020   Persistent atrial fibrillation [facility])     Lottie Mussel, Student-PT 07/05/2021, 2:06 PM  [facility] [address] [place], [place], [address] Phone: [phone]   Fax:  [phone]  Name: Connor [name] MRN: [id number] Date of Birth: [birth year]

## 2021-07-12 ENCOUNTER — Other Ambulatory Visit: Payer: Self-pay

## 2021-07-12 ENCOUNTER — Ambulatory Visit: Payer: Medicaid Other

## 2021-07-12 VITALS — HR 53

## 2021-07-12 DIAGNOSIS — R2681 Unsteadiness on feet: Secondary | ICD-10-CM

## 2021-07-12 DIAGNOSIS — R293 Abnormal posture: Secondary | ICD-10-CM

## 2021-07-12 DIAGNOSIS — M6281 Muscle weakness (generalized): Secondary | ICD-10-CM

## 2021-07-12 DIAGNOSIS — R2689 Other abnormalities of gait and mobility: Secondary | ICD-10-CM | POA: Diagnosis not present

## 2021-07-12 NOTE — Therapy (Signed)
[facility] [address] [place], [place], [address] Phone: [phone]   Fax:  [phone]  Physical Therapy Treatment  Patient Details  Name: Connor [name] MRN: [id number] Date of Birth: Aug 31, 1955 Referring Provider (PT): Dr. Sharol Given   Encounter Date: 07/12/2021   PT End of Session - 07/12/21 1147     Visit Number 6    Number of Visits 17    Date for PT Re-Evaluation 08/04/21    Authorization Type Medicaid (has used 9 visit this year so far) 10/26-12/6 12 visits    Authorization - Visit Number 4    Authorization - Number of Visits 12    PT Start Time 1100    PT Stop Time 1142    PT Time Calculation (min) 42 min    Equipment Utilized During Treatment Gait belt    Activity Tolerance Patient tolerated treatment well    Behavior During Therapy WFL for tasks assessed/performed             Past Medical History:  Diagnosis Date   AAA (abdominal aortic aneurysm)    History of open heart surgery    Hypertension    Seroma due to trauma [facility])     Past Surgical History:  Procedure Laterality Date   ABDOMINAL AORTIC ANEURYSM REPAIR     AMPUTATION Left 09/07/2020   Procedure: ATTEMPTED LEFT LEG DEBRIDEMENT FASCIOTOMIES, APPLY INSTILLATION WOUND VAC, ABOVE KNEE AMPUTATION;  Surgeon: Newt Minion, MD;  Location: [facility] [place];  Service: Orthopedics;  Laterality: Left;   BUBBLE STUDY  09/05/2020   Procedure: BUBBLE STUDY;  Surgeon: Freada Bergeron, MD;  Location: [place];  Service: Cardiovascular;;   CARDIOVERSION N/A 02/11/2020   Procedure: CARDIOVERSION;  Surgeon: Donato Heinz, MD;  Location: [place];  Service: Cardiovascular;  Laterality: N/A;   CORONARY ARTERY BYPASS GRAFT     STUMP REVISION Left 09/09/2020   Procedure: REVISION LEFT ABOVE KNEE AMPUTATION;  Surgeon: Newt Minion, MD;  Location: [place];  Service: Orthopedics;  Laterality: Left;   TEE WITHOUT CARDIOVERSION N/A 09/05/2020    Procedure: TRANSESOPHAGEAL ECHOCARDIOGRAM (TEE);  Surgeon: Freada Bergeron, MD;  Location: [facility] ENDOSCOPY;  Service: Cardiovascular;  Laterality: N/A;    Vitals:   07/12/21 1104  Pulse: (!) 53  SpO2: 97%     Subjective Assessment - 07/12/21 1104     Subjective Pt reports he was off of his lasixs becasue he ran out for three days but had some delivered to him yesterday. Pt reports that his doctor is not responding to his calls to get his medications refilled. Pt reports his tooth has been hurting still. Pt states he was able to get another appointment w/ cardiologist on 12/1.    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    Patient Stated Goals Improve walking    Currently in Pain? Yes    Pain Score 6     Pain Location Teeth    Pain Descriptors / Indicators Aching    Pain Onset 1 to 4 weeks ago    Pain Frequency Constant  [facility] Adult PT Treatment/Exercise - 07/12/21 1108       Transfers   Transfers Sit to Stand;Stand to Sit    Sit to Stand 5: Supervision    Stand to Sit 5: Supervision    Stand to Sit Details (indicate cue type and reason) Verbal cues for technique      Ambulation/Gait   Ambulation/Gait Yes    Ambulation/Gait Assistance 5: Supervision    Ambulation/Gait Assistance Details After laps pt O2 sat went from 97% to 93% but went up back to baseline after a couple minutes rest    Ambulation Distance (Feet) 230 Feet    Assistive device Rolling walker    Gait Pattern Step-through pattern;Decreased  stance time - left    Ambulation Surface Level;Indoor      Neuro Re-ed    Neuro Re-ed Details  At counter pt performed walking w/ [facility] w/ quad tip. Pt performed 3 laps for 2 sets and 4 laps for 1 set. Pt performs w/ slight apprehension as he does not fully trust LLE but does a good job of realizing when a step feels off and readujusting his gait. Pt occasionally struggles w/ taking shorter steps as he sometimes has difficulty making sure his leg is locked. PT provided min guard and occasional verbal cues for technique.      Prosthetics   Current prosthetic wear tolerance (days/week)  daily    Current prosthetic wear tolerance (#hours/day)  all awake hours    Residual limb condition  pt denies any skin issues                       PT Short Term Goals - 07/05/21 1350       PT SHORT TERM GOAL #1   Title Pt will be able to donn/doff prosthesis independently for improved mobility.    Baseline 06/08/21 Pt needs some assist to correctly donn prosthesis. Not sitting all the way down in socket at eval due to pants in way. 07/05/21 Pt still needed some help with correcting porthesis fitment, educated on proper shrinkage use and corrected number of socks being worn to improve fit.    Time 4    Period Weeks    Status On-going    Target Date 07/06/21      PT SHORT TERM GOAL #2   Title Berg Balance will be performed and LTG written to further assess balance.    Baseline 06/21/21 Berg performed and LTG written.    Time 4    Period Weeks    Status Achieved    Target Date 07/06/21      PT SHORT TERM GOAL #3   Title Pt will decrease TUG from 53 sec to <48 sec for improved balance and functional mobility.    Baseline 06/08/21 53 sec with RW, 07/05/21 38.83 average between two trials with RW    Time 4    Period Weeks    Status Achieved    Target Date 07/06/21      PT SHORT TERM GOAL #4   Title Pt will ambulate >300' with RW on level surfaces for improved household and short community  distances mod I.    Baseline 06/08/21 115' with RW CGA, 07/05/21 345' w/ RW    Time 4    Period Weeks    Status Achieved    Target Date 07/06/21               PT Long Term Goals - 06/21/21  [place] #1   Title Pt will be independent with HEP for strengthening, ROM and balance to continue gains on own. (LTGs due 08/04/21)    Baseline need to update old HEP prior to hospitalization    Time 8    Period Weeks    Status New      PT LONG TERM GOAL #2   Title Pt will ambulate >400' on varied indoor and outdoor level surfaces with RW supervision for improved short community distances.    Baseline 115' with RW mod I    Time 8    Period Weeks    Status New      PT LONG TERM GOAL #3   Title Pt will ambulate up/down steps with railings, up/down curb and ramp with RW supervision for improved community access.    Baseline not performed    Time 8    Period Weeks    Status New      PT LONG TERM GOAL #4   Title Pt will increase gait speed from 0.52m/s to >0.51m/s for improved household safety.    Baseline 0.59m/s on 06/08/21    Time 8    Period Weeks    Status New      PT LONG TERM GOAL #5   Title Pt will increase Berg Balance score from 38/56 to >42/56 for improved balance and decreased fall risk.    Baseline 06/21/21 38/56    Time 8    Period Weeks    Status New                   Plan - 07/12/21 1200     Clinical Impression Statement PT's focus today was to continue to progress pt's gait. Pt presents w/ ability to walk w/ [facility] w/ quad tip demonstrating improvements in gait and functional mobility. Pt still demonstrates deficits in balance and gait as he occassionally stumbles and does not fully trust LLE yet. PT will continue to progress pt as tolerated w/ activities to further progress pt towards LTG.    Personal Factors and Comorbidities Comorbidity 3+    Comorbidities MHx HTN, CHF with EF 60-65%, CAD s/p CABG, CKD stage II, Diabetes with hx of L  AKA, Anemia, A fib on Eliquis, and hx of abdominal aortic aneurysm repair   09/09/20 revision left AKA    Examination-Activity Limitations Locomotion Level;Transfers;Stairs;Stand;Squat    Examination-Participation Restrictions Cleaning;Community Activity;Yard Work    [employer]chant [employer] officer Evolving/Moderate complexity    Rehab Potential Good    PT Frequency 2x / week   plus eval   PT Duration 8 weeks    PT Treatment/Interventions ADLs/Self Care Home Management;Cryotherapy;Moist Heat;Gait training;Stair training;Functional mobility training;Therapeutic activities;Therapeutic exercise;Balance training;Manual techniques;Prosthetic Training;Patient/family education;Neuromuscular re-education;Passive range of motion;Energy conservation;DME Instruction    PT Next Visit Plan Check prothesis fit at beginning of next session. Monitor vitals. O2 did desat some at eval with activity but just to 93%. Did pt talk to cardiologist to get diuretics figured out? Prosthetic training.  Gait training with RW trying to continue to decrease support to 1UE in // bars and possibly to quad cane when ready, balance training.             Patient will benefit from skilled therapeutic intervention in order to improve the following deficits and impairments:  Abnormal gait, Cardiopulmonary status limiting activity, Decreased activity tolerance, Decreased balance, Decreased mobility, Decreased knowledge of use of DME, Decreased strength, Increased  edema, Decreased endurance, Decreased range of motion, Difficulty walking  Visit Diagnosis: Other abnormalities of gait and mobility  Muscle weakness (generalized)  Unsteadiness on feet  Abnormal posture     Problem List Patient Active Problem List   Diagnosis Date Noted   Acute pulmonary edema (HCC)    Acute on chronic diastolic (congestive) heart failure ([place]) 12/08/2020   Acute on chronic diastolic CHF (congestive heart failure) ([place]) 12/07/2020    Paroxysmal A-fib ([place]) 10/10/2020   Gastritis 10/10/2020   Dyspepsia 10/07/2020   CKD stage G2/A2, GFR 60-89 and albumin creatinine ratio 30-299 mg/g 10/07/2020   Atypical chest pain 10/06/2020   Acute blood loss anemia    Essential hypertension    Postoperative pain    Severe anemia 09/19/2020   History of left above knee amputation ([place]) 09/19/2020   Left above-knee amputee ([place]) 09/19/2020   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 ([place])    CKD (chronic kidney disease), stage III ([place]) 09/01/2020   Morbid obesity ([place]) 09/01/2020   Severe sepsis with acute organ dysfunction due to Streptococcus species ([place]) 08/30/2020   Cellulitis of left leg 08/30/2020   Lactic acidosis 08/30/2020   Elevated troponin 08/30/2020   Hypoalbuminemia 08/30/2020   Leukocytosis 08/30/2020   Thrombocytopenia ([place]) 08/30/2020   Hypokalemia 08/30/2020   Hyperglycemia 08/30/2020   Transaminitis 08/30/2020   Total bilirubin, elevated 08/30/2020   AKI (acute kidney injury) ([place]) 08/30/2020   Nausea vomiting and diarrhea 08/30/2020   Hypertension 05/20/2020   Persistent atrial fibrillation [facility])     Lottie Mussel, Student-PT 07/12/2021, 12:05 PM  [place] [address] [place], [place], [address] Phone: [phone]   Fax:  [phone]  Name: Connor [name] MRN: [id number] Date of Birth: 19-Dec-1955

## 2021-07-19 ENCOUNTER — Ambulatory Visit: Payer: Medicaid Other

## 2021-07-19 ENCOUNTER — Other Ambulatory Visit: Payer: Self-pay

## 2021-07-19 DIAGNOSIS — M6281 Muscle weakness (generalized): Secondary | ICD-10-CM

## 2021-07-19 DIAGNOSIS — R2689 Other abnormalities of gait and mobility: Secondary | ICD-10-CM | POA: Diagnosis not present

## 2021-07-19 DIAGNOSIS — R2681 Unsteadiness on feet: Secondary | ICD-10-CM

## 2021-07-19 NOTE — Therapy (Signed)
[facility] [address] [place], [place], [address] Phone: [phone]   Fax:  [phone]  Physical Therapy Treatment  Patient Details  Name: Connor [name] MRN: [id number] Date of Birth: [birth year] Referring Provider (PT): Dr. Sharol Given   Encounter Date: 07/19/2021   PT End of Session - 07/19/21 1324     Visit Number 7    Number of Visits 17    Date for PT Re-Evaluation 08/04/21    Authorization Type Medicaid (has used 9 visit this year so far) 10/26-12/6 12 visits    Authorization - Visit Number 6    Authorization - Number of Visits 12    PT Start Time 1102    PT Stop Time 1145    PT Time Calculation (min) 43 min    Equipment Utilized During Treatment Gait belt    Activity Tolerance Patient tolerated treatment well    Behavior During Therapy WFL for tasks assessed/performed             Past Medical History:  Diagnosis Date   AAA (abdominal aortic aneurysm)    History of open heart surgery    Hypertension    Seroma due to trauma [facility])     Past Surgical History:  Procedure Laterality Date   ABDOMINAL AORTIC ANEURYSM REPAIR     AMPUTATION Left 09/07/2020   Procedure: ATTEMPTED LEFT LEG DEBRIDEMENT FASCIOTOMIES, APPLY INSTILLATION WOUND VAC, ABOVE KNEE AMPUTATION;  Surgeon: Newt Minion, MD;  Location: [facility];  Service: Orthopedics;  Laterality: Left;   BUBBLE STUDY  09/05/2020   Procedure: BUBBLE STUDY;  Surgeon: Freada Bergeron, MD;  Location: [facility];  Service: Cardiovascular;;   CARDIOVERSION N/A 02/11/2020   Procedure: CARDIOVERSION;  Surgeon: Donato Heinz, MD;  Location: [facility];  Service: Cardiovascular;  Laterality: N/A;   CORONARY ARTERY BYPASS GRAFT     STUMP REVISION Left 09/09/2020   Procedure: REVISION LEFT ABOVE KNEE AMPUTATION;  Surgeon: Newt Minion, MD;  Location: [facility];  Service: Orthopedics;  Laterality: Left;   TEE WITHOUT CARDIOVERSION N/A 09/05/2020    Procedure: TRANSESOPHAGEAL ECHOCARDIOGRAM (TEE);  Surgeon: Freada Bergeron, MD;  Location: [facility] ENDOSCOPY;  Service: Cardiovascular;  Laterality: N/A;    There were no vitals filed for this visit.   Subjective Assessment - 07/19/21 1104     Subjective Pt reports his prosthetic is turning out on him today. Pt reports he has been wearing his leg all day and when he takes it off his phantom pain has been bad once he take it off. Pt states his skin is good and not sweating as much.    Pertinent History atrial fibrillation (on eliquis), CHF, AKI (previously requiring CRRT and has since recovered), s/p left AKA for infection and a type A aortic dissection s/p open repair (2016 at [facility]) with recurrence at the distal anastomosis s/p frozen elephant trunk and total arch replacement (2019, [facility]), left renal artery and left common and external iliac stenting (08/2018), TEVAR extension and LRA stenting via bilateral CFA on 02/06/21 [facility]).    Patient Stated Goals Improve walking    Currently in Pain? Yes    Pain Score 3     Pain Location Teeth    Pain Descriptors / Indicators Aching    Pain Type Chronic pain    Pain Onset 1 to 4 weeks ago                 Prosthetics Assessment - 07/19/21 1326  Prosthetics   Donning prosthesis  Other (comment)   PT assisted pt w/ adding another ply sock and making sure that leg was on straight due to pt concerns of leg turning out.   Current prosthetic wear tolerance (days/week)  daily    Current prosthetic wear tolerance (#hours/day)  all awake hours    Residual limb condition  pt denies any skin issues                          [facility] Adult PT Treatment/Exercise - 07/19/21 1326       Transfers   Transfers Sit to Stand;Stand to Sit    Sit to Stand 5: Supervision    Stand to Sit 5: Supervision      Ambulation/Gait   Ambulation/Gait Yes    Ambulation/Gait Assistance 5: Supervision    Ambulation/Gait Assistance  Details After 2 laps adjusted pt's prosthetic and added in one ply sock. Pt then took two more laps O2 sat dropped to 91 but came back up to baseline after rest. Pt was cued to keep pelvis straight as tends to rotate posterior on left which was contributing to ER of prosthesis.    Ambulation Distance (Feet) 460 Feet    Assistive device Rolling walker    Gait Pattern Step-through pattern;Decreased stance time - left    Ambulation Surface Level;Indoor      Neuro Re-ed    Neuro Re-ed Details  At counter pt performed 2x4 laps of walking w/ quad cane. PT provided min guard and verbal cues to stand tall and to weight shift over LLE. Pt's O2 sat dropped to 88 after 4 laps and came back up to baseline after break w/ pursed lipped breathing. Pt performed x10 weight shift w/ RLE on 4in step in front of mirror to facilitate increased weight shift over LLE. Pt's gait w/ quad cane improved after activity. PT provided min guard and verbal cues to tighten glute.                       PT Short Term Goals - 07/05/21 1350       PT SHORT TERM GOAL #1   Title Pt will be able to donn/doff prosthesis independently for improved mobility.    Baseline 06/08/21 Pt needs some assist to correctly donn prosthesis. Not sitting all the way down in socket at eval due to pants in way. 07/05/21 Pt still needed some help with correcting porthesis fitment, educated on proper shrinkage use and corrected number of socks being worn to improve fit.    Time 4    Period Weeks    Status On-going    Target Date 07/06/21      PT SHORT TERM GOAL #2   Title Berg Balance will be performed and LTG written to further assess balance.    Baseline 06/21/21 Berg performed and LTG written.    Time 4    Period Weeks    Status Achieved    Target Date 07/06/21      PT SHORT TERM GOAL #3   Title Pt will decrease TUG from 53 sec to <48 sec for improved balance and functional mobility.    Baseline 06/08/21 53 sec with RW, 07/05/21  38.83 average between two trials with RW    Time 4    Period Weeks    Status Achieved    Target Date 07/06/21      PT SHORT TERM GOAL #  4   Title Pt will ambulate >300' with RW on level surfaces for improved household and short community distances mod I.    Baseline 06/08/21 115' with RW CGA, 07/05/21 345' w/ RW    Time 4    Period Weeks    Status Achieved    Target Date 07/06/21               PT Long Term Goals - 06/21/21 1556       PT LONG TERM GOAL #1   Title Pt will be independent with HEP for strengthening, ROM and balance to continue gains on own. (LTGs due 08/04/21)    Baseline need to update old HEP prior to hospitalization    Time 8    Period Weeks    Status New      PT LONG TERM GOAL #2   Title Pt will ambulate >400' on varied indoor and outdoor level surfaces with RW supervision for improved short community distances.    Baseline 115' with RW mod I    Time 8    Period Weeks    Status New      PT LONG TERM GOAL #3   Title Pt will ambulate up/down steps with railings, up/down curb and ramp with RW supervision for improved community access.    Baseline not performed    Time 8    Period Weeks    Status New      PT LONG TERM GOAL #4   Title Pt will increase gait speed from 0.29m/s to >0.67m/s for improved household safety.    Baseline 0.9m/s on 06/08/21    Time 8    Period Weeks    Status New      PT LONG TERM GOAL #5   Title Pt will increase Berg Balance score from 38/56 to >42/56 for improved balance and decreased fall risk.    Baseline 06/21/21 38/56    Time 8    Period Weeks    Status New                   Plan - 07/19/21 1334     Clinical Impression Statement Today's PT session focused on continued ambulaiton w/ quad cane. Pt still presents w/ deficits in LLE weight shift causing foot to externally rotate. PT will continue to progress pt w/ activities to further improve LLE weight shift for improved ambulation and towards achieving pt  LTG.    Personal Factors and Comorbidities Comorbidity 3+    Comorbidities MHx HTN, CHF with EF 60-65%, CAD s/p CABG, CKD stage II, Diabetes with hx of L AKA, Anemia, A fib on Eliquis, and hx of abdominal aortic aneurysm repair   09/09/20 revision left AKA    Examination-Activity Limitations Locomotion Level;Transfers;Stairs;Stand;Squat    Examination-Participation Restrictions Cleaning;Community Activity;Yard Work    Conservation officer, historic buildings Evolving/Moderate complexity    Rehab Potential Good    PT Frequency 2x / week   plus eval   PT Duration 8 weeks    PT Treatment/Interventions ADLs/Self Care Home Management;Cryotherapy;Moist Heat;Gait training;Stair training;Functional mobility training;Therapeutic activities;Therapeutic exercise;Balance training;Manual techniques;Prosthetic Training;Patient/family education;Neuromuscular re-education;Passive range of motion;Energy conservation;DME Instruction    PT Next Visit Plan Check prothesis fit at beginning of next session. Monitor vitals. O2 did desat some at eval with activity but just to 93%. Did pt talk to cardiologist to get diuretics figured out? Prosthetic training.  Gait training with RW trying to continue to decrease support to 1UE in // bars and  possibly to quad cane when ready, balance training.             Patient will benefit from skilled therapeutic intervention in order to improve the following deficits and impairments:  Abnormal gait, Cardiopulmonary status limiting activity, Decreased activity tolerance, Decreased balance, Decreased mobility, Decreased knowledge of use of DME, Decreased strength, Increased edema, Decreased endurance, Decreased range of motion, Difficulty walking  Visit Diagnosis: Other abnormalities of gait and mobility  Muscle weakness (generalized)  Unsteadiness on feet     Problem List Patient Active Problem List   Diagnosis Date Noted   Acute pulmonary edema (HCC)    Acute on chronic  diastolic (congestive) heart failure (HCC) 12/08/2020   Acute on chronic diastolic CHF (congestive heart failure) (HCC) 12/07/2020   Paroxysmal A-fib (HCC) 10/10/2020   Gastritis 10/10/2020   Dyspepsia 10/07/2020   CKD stage G2/A2, GFR 60-89 and albumin creatinine ratio 30-299 mg/g 10/07/2020   Atypical chest pain 10/06/2020   Acute blood loss anemia    Essential hypertension    Postoperative pain    Severe anemia 09/19/2020   History of left above knee amputation (HCC) 09/19/2020   Left above-knee amputee (HCC) 09/19/2020   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)    CKD (chronic kidney disease), stage III (HCC) 09/01/2020   Morbid obesity (HCC) 09/01/2020   Severe sepsis with acute organ dysfunction due to Streptococcus species (HCC) 08/30/2020   Cellulitis of left leg 08/30/2020   Lactic acidosis 08/30/2020   Elevated troponin 08/30/2020   Hypoalbuminemia 08/30/2020   Leukocytosis 08/30/2020   Thrombocytopenia (HCC) 08/30/2020   Hypokalemia 08/30/2020   Hyperglycemia 08/30/2020   Transaminitis 08/30/2020   Total bilirubin, elevated 08/30/2020   AKI (acute kidney injury) (HCC) 08/30/2020   Nausea vomiting and diarrhea 08/30/2020   Hypertension 05/20/2020   Persistent atrial fibrillation [facility])     Margie Ege, Student-PT 07/19/2021, 1:49 PM  [facility] [address] [place], [place], [address] Phone: [phone]   Fax:  [phone]  Name: Connor [name] MRN: [id number] Date of Birth: 01/25/1956

## 2021-07-26 ENCOUNTER — Ambulatory Visit: Payer: Medicaid Other

## 2021-07-26 ENCOUNTER — Other Ambulatory Visit: Payer: Self-pay

## 2021-07-26 DIAGNOSIS — R2689 Other abnormalities of gait and mobility: Secondary | ICD-10-CM

## 2021-07-26 DIAGNOSIS — R2681 Unsteadiness on feet: Secondary | ICD-10-CM

## 2021-07-26 DIAGNOSIS — R293 Abnormal posture: Secondary | ICD-10-CM

## 2021-07-26 DIAGNOSIS — M6281 Muscle weakness (generalized): Secondary | ICD-10-CM

## 2021-07-26 NOTE — Progress Notes (Signed)
Cardiology Office Note:    Date:  07/27/2021   ID:  Connor [name], DOB October 23, 1955, MRN [id number]  PCP:  Ludwig Clarks, FNP  Cardiologist:  Donato Heinz, MD  Electrophysiologist:  None   Referring MD: Ludwig Clarks, FNP   No chief complaint on file.   History of Present Illness:    Connor [name] is a 65 y.o. male with a hx of aortic dissection who presents for follow-up.  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  He underwent left common and external iliac artery and left renal vein stents on 09/19/2018 in preparation for total aortic arch repair.  Course was complicated by cardiogenic shock (LVEF down to 15%, which has since recovered), AKI requiring CVVH (with subsequent recovery of renal function), extensive left upper extremity DVT.  On 10/03/2018, he underwent total arch replacement, frozen elephant trunk, and head vessel reconstruction with Dr.Takayama and Dr. Posey Pronto.  Course was complicated by sternal wound infection, with cultures growing Klebsiella.  He completed a 14-day course of cefadroxil.  He was following closely with cardiology and nephrology in [place] for blood pressure control.  Moved to [place] in July to help with a family issue.   He was in the ED on 06/19/2019 with chest tightness and shortness of breath.  Troponins negative x2.  CTA chest showed patent graft of the ascending aortic component, patent graft of the proximal descending aorta with, thrombosis of the proximal descending thoracic aortic dissection false lumen along the stent graft, patent type B aortic dissection of the mid descending thoracic aorta extending to the abdominal aortic bifurcation.  Reports that he feels chest pain has been related to muscle cramps from low potassium.  Now taking potassium supplements.  States that he also has chest pain across the incision in the right upper chest when he  does stretches.  TTE on 10/02/2019 showed EF 60-65%, severe LVH, normal RV function, no significant valvular disease.  At clinic visit on 01/05/2020, he was found to be in atrial fibrillation.  Underwent successful DCCV on 02/11/2020.   He was admitted to [facility] from 1/4 through 09/19/2020 with necrotizing fasciitis of the left leg and strep bacteremia.  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  Echocardiogram 09/01/2020 showed EF 60 to 65%, severe LVH, grade 2 diastolic dysfunction, normal RV function, severe left atrial dilatation, moderate right atrial dilatation, no significant valvular disease.  TEE on 09/05/2020 showed no vegetations, positive bubble study.  He was admitted 11/2020 with uncontrolled hypertension and acute on chronic diastolic heart failure, improved with IV Lasix.  He was admitted in June 2022 to [facility] with type B aortic dissection and hypertensive crisis.  On 02/06/2021 he underwent TEVAR extension and left renal artery stenting.  Since his last clinic visit, he reports he has been doing okay.  Denies any chest pain.  Does report some shortness of breath.  Reports occasional lightheadedness, denies any syncope.  Denies any lower extremity edema or palpitations.  Reports BP has been 130s over 70s when checks at home.  Reports he can walk up a flight of stairs without stopping when using his prosthetic leg, denies any chest pain or dyspnea with this.  Has been exercising at the gym doing upper body exercises 3 days/week.   Wt Readings from Last 3 Encounters:  07/27/21 238 lb 12.8 oz (108.3 kg)  03/30/21 224 lb (101.6 kg)  03/25/21 241 lb (109.3 kg)     Past Medical History:  Diagnosis Date   AAA (abdominal aortic aneurysm)    History of open heart surgery    Hypertension    Seroma due to trauma [facility])     Past Surgical History:  Procedure Laterality Date   ABDOMINAL AORTIC ANEURYSM REPAIR     AMPUTATION Left 09/07/2020   Procedure: ATTEMPTED LEFT LEG DEBRIDEMENT FASCIOTOMIES, APPLY  INSTILLATION WOUND VAC, ABOVE KNEE AMPUTATION;  Surgeon: Newt Minion, MD;  Location: [place];  Service: Orthopedics;  Laterality: Left;   BUBBLE STUDY  09/05/2020   Procedure: BUBBLE STUDY;  Surgeon: Freada Bergeron, MD;  Location: [place];  Service: Cardiovascular;;   CARDIOVERSION N/A 02/11/2020   Procedure: CARDIOVERSION;  Surgeon: Donato Heinz, MD;  Location: [place];  Service: Cardiovascular;  Laterality: N/A;   CORONARY ARTERY BYPASS GRAFT     STUMP REVISION Left 09/09/2020   Procedure: REVISION LEFT ABOVE KNEE AMPUTATION;  Surgeon: Newt Minion, MD;  Location: [place];  Service: Orthopedics;  Laterality: Left;   TEE WITHOUT CARDIOVERSION N/A 09/05/2020   Procedure: TRANSESOPHAGEAL ECHOCARDIOGRAM (TEE);  Surgeon: Freada Bergeron, MD;  Location: [facility] ENDOSCOPY;  Service: Cardiovascular;  Laterality: N/A;    Current Medications: Current Meds  Medication Sig   acetaminophen (TYLENOL) 325 MG tablet Take 2 tablets (650 mg total) by mouth every 6 (six) hours as needed for moderate pain.   amoxicillin (AMOXIL) 500 MG capsule Take 1,000 mg by mouth 2 (two) times daily.   aspirin EC 81 MG tablet Take 81 mg by mouth daily. Swallow whole.   diclofenac Sodium (VOLTAREN) 1 % GEL APPLY 2 G TOPICALLY FOUR TIMES DAILY.   diltiazem (CARDIZEM CD) 240 MG 24 hr capsule TAKE 1 CAPSULE (240 MG TOTAL) BY MOUTH DAILY.   diltiazem (DILACOR XR) 240 MG 24 hr capsule Take 240 mg by mouth daily.   ELIQUIS 5 MG TABS tablet TAKE 1 TABLET (5 MG TOTAL) BY MOUTH 2 (TWO) TIMES DAILY.   furosemide (LASIX) 80 MG tablet Take 80 mg by mouth daily.   gabapentin (NEURONTIN) 400 MG capsule TAKE 1 CAPSULE (400 MG TOTAL) BY MOUTH THREE TIMES DAILY.   hydrALAZINE (APRESOLINE) 50 MG tablet Take 1 tablet by mouth in the morning, at noon, and at bedtime.   HYDROcodone-acetaminophen (NORCO/VICODIN) 5-325 MG tablet Take 1 tablet by mouth 3 (three) times daily as needed.   isosorbide mononitrate (IMDUR) 30 MG  24 hr tablet Take 1 tablet (30 mg total) by mouth daily.   labetalol (NORMODYNE) 200 MG tablet TAKE 2 TABLETS (400 MG TOTAL) BY MOUTH TWO TIMES DAILY.   Multiple Vitamins-Minerals (MULTIVITAMIN WITH MINERALS) tablet Take 1 tablet by mouth daily. Men   ondansetron (ZOFRAN) 4 MG tablet Take 1 tablet (4 mg total) by mouth every 8 (eight) hours as needed for nausea or vomiting.   oxyCODONE (OXY IR/ROXICODONE) 5 MG immediate release tablet Take 1 tablet (5 mg total) by mouth every 6 (six) hours as needed for severe pain.   potassium chloride SA (KLOR-CON) 20 MEQ tablet Take 20 mEq by mouth daily.   spironolactone (ALDACTONE) 25 MG tablet Take 1 tablet (25 mg total) by mouth daily.   traZODone (DESYREL) 100 MG tablet Take 100 mg by mouth at bedtime.   [DISCONTINUED] furosemide (LASIX) 40 MG tablet Take 1 tablet (40 mg total) by mouth daily. Take once daily for swelling or shortness of breath. (Patient taking differently: Take 80 mg by mouth  daily. Take once daily for swelling or shortness of breath. Per pt this was doubled to 80mg  after hospital stay but his bubble packaged meds did not have it that way.)     Allergies:   Patient has no known allergies.   Social History   Socioeconomic History   Marital status: Single    Spouse name: Not on file   Number of children: Not on file   Years of education: Not on file   Highest education level: Not on file  Occupational History   Not on file  Tobacco Use   Smoking status: Every Day    Packs/day: 0.50    Types: Cigarettes   Smokeless tobacco: Never  Vaping Use   Vaping Use: Never used  Substance and Sexual Activity   Alcohol use: Never   Drug use: Never   Sexual activity: Not on file  Other Topics Concern   Not on file  Social History Narrative   Not on file   Social Determinants of Health   Financial Resource Strain: Not on file  Food Insecurity: Not on file  Transportation Needs: Not on file  Physical Activity: Not on file  Stress:  Not on file  Social Connections: Not on file     Family History: Mother died of MI at age 75.  Father had stroke at age 3  ROS:   Please see the history of present illness.     All other systems reviewed and are negative.  EKGs/Labs/Other Studies Reviewed:    The following studies were reviewed today:   EKG:   07/27/2021: Sinus rhythm, rate 65, first-degree AV block chronic, left axis deviation, no ST abnormalities  TTE 10/02/19:  1. Left ventricular ejection fraction, by visual estimation, is 60 to  65%. The left ventricle has normal function. Left ventricular septal wall  thickness was severely increased. Severely increased left ventricular  posterior wall thickness. There is  severely increased left ventricular hypertrophy.   2. Left ventricular diastolic function could not be evaluated.   3. The left ventricle has no regional wall motion abnormalities.   4. Global right ventricle has normal systolic function.The right  ventricular size is normal. No increase in right ventricular wall  thickness.   5. Left atrial size was moderately dilated.   6. Right atrial size was normal.   7. The mitral valve is normal in structure. Trivial mitral valve  regurgitation. No evidence of mitral stenosis.   8. The tricuspid valve is normal in structure.   9. The tricuspid valve is normal in structure. Tricuspid valve  regurgitation is not demonstrated.  10. The aortic valve is tricuspid. Aortic valve regurgitation is not  visualized. No evidence of aortic valve sclerosis or stenosis.  11. The pulmonic valve was normal in structure. Pulmonic valve  regurgitation is not visualized.  12. The inferior vena cava is normal in size with greater than 50%  respiratory variability, suggesting right atrial pressure of 3 mmHg.   Recent Labs: 12/09/2020: Magnesium 1.9 07/27/2021: ALT 16; BNP WILL FOLLOW; BUN 18; Creatinine, Ser 1.20; Hemoglobin 9.7; Platelets 152; Potassium 4.1; Sodium 140  Recent  Lipid Panel    Component Value Date/Time   CHOL 148 07/27/2021 1030   TRIG 50 07/27/2021 1030   HDL 46 07/27/2021 1030   CHOLHDL 3.2 07/27/2021 1030   CHOLHDL NOT CALCULATED 09/04/2020 1446   VLDL 35 09/04/2020 1446   LDLCALC 91 07/27/2021 1030    Physical Exam:    VS:  BP  138/80   Pulse 65   Ht 5\' 11"  (1.803 m)   Wt 238 lb 12.8 oz (108.3 kg)   SpO2 98%   BMI 33.31 kg/m     Wt Readings from Last 3 Encounters:  07/27/21 238 lb 12.8 oz (108.3 kg)  03/30/21 224 lb (101.6 kg)  03/25/21 241 lb (109.3 kg)     GEN: Well nourished, well developed in no acute distress HEENT: Normal NECK: No JVD CARDIAC: RRR, 2/6 systolic murmur RESPIRATORY:  Clear to auscultation without rales, wheezing or rhonchi  ABDOMEN: Soft, non-tender, non-distended MUSCULOSKELETAL: Trace edema SKIN: Warm and dry NEUROLOGIC:  Alert and oriented x 3 PSYCHIATRIC:  Normal affect   ASSESSMENT:    1. Pre-op evaluation   2. Resistant hypertension   3. Persistent atrial fibrillation ([place])   4. Lipid screening   5. Dissection of aorta, unspecified portion of aorta ([place])   6. OSA (obstructive sleep apnea)     PLAN:    Preop evaluation: planning teeth extractions, has to be done under general anesthesia.  Functional capacity greater than 4 METS, denies any exertional symptoms.  RCRI score 0 -No further cardiac work-up recommended prior to procedure.  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  He was admitted in June 2022 to [facility] with type B aortic dissection and hypertensive crisis.  On 02/06/2021 he underwent TEVAR extension and left renal artery stenting. -Follows with [facility]  vascular surgery -Goal SBP less than 120, heart rate less than 60.     Persistent atrial fibrillation: At clinic visit on 01/05/2020, he was found to be in atrial fibrillation.  Underwent successful DCCV on 02/11/2020, but subsequently back in AF.  CHADS-VASc score 2 given HTN, vascular disease. TTE shows normal LVEF.  Currently in sinus rhythm. -Continue Eliquis 5 mg twice daily -Continue diltiazem and labetalol   Resistant hypertension: Currently on spironolactone 25 mg daily, lisinopril 20 mg daily, hydralazine 50 mg 3 times daily, diltiazem 240 mg daily, Imdur 30 mg daily, Lasix 40 mg daily, labetalol 400 mg twice daily. -PCP increase Lasix to 80 mg yesterday.  Will check CMP, BNP -BP mildly above goal.  We will hold off on other changes at this time given increased his Lasix yesterday.  Asked to check BP twice daily for next 2 weeks and bring appointment to pharmacy hypertension clinic in 2 weeks -Given resistant hypertension and also issues with persistent hypokalemia, concern for hyperaldosteronism.  Renin/aldosterone ratio >60, have referred to endocrinology and started on spironolactone.  He was seen by endocrinology during admission at [facility] in June 2022, agreed likely had hyperaldosteronism and recommended titrating up spironolactone to 100 mg daily.  Plan to titrate up spironolactone if BP remains above goal -Suspect untreated OSA also contributing to resistant hypertension, he had a positive sleep study in September but was lost to follow-up.  Will follow-up to ensure he is started on CPAP.   OSA: positive sleep study 04/2020, has not been started on CPAP.  Will send message to sleep medicine discussed with sleep medicine   RTC in 3 months  Medication Adjustments/Labs and Tests Ordered: Current medicines are reviewed at length with the patient today.  Concerns regarding medicines are  outlined above.  Orders Placed This Encounter  Procedures   Comprehensive metabolic panel   CBC   Lipid  panel   Brain natriuretic peptide   Specimen status report   EKG 12-Lead    No orders of the defined types were placed in this encounter.   Patient Instructions  Medication Instructions:  Your physician recommends that you continue on your current medications as directed. Please refer to the Current Medication list given to you today.  *If you need a refill on your cardiac medications before your next appointment, please call your pharmacy*   Lab Work: CMET, CBC, Lipid, BNP today  If you have labs (blood work) drawn today and your tests are completely normal, you will receive your results only by: MyChart Message (if you have MyChart) OR A paper copy in the mail If you have any lab test that is abnormal or we need to change your treatment, we will call you to review the results.  Follow-Up: At [facility], you and your health needs are our priority.  As part of our continuing mission to provide you with exceptional heart care, we have created designated Provider Care Teams.  These Care Teams include your primary Cardiologist (physician) and Advanced Practice Providers (APPs -  Physician Assistants and Nurse Practitioners) who all work together to provide you with the care you need, when you need it.  We recommend signing up for the patient portal called "MyChart".  Sign up information is provided on this After Visit Summary.  MyChart is used to connect with patients for Virtual Visits (Telemedicine).  Patients are able to view lab/test results, encounter notes, upcoming appointments, etc.  Non-urgent messages can be sent to your provider as well.   To learn more about what you can do with MyChart, go to [email or web address].    Your next appointment:   PharmD in 2 weeks 3 months with Dr. Bjorn Pippin  Other Instructions Please check your blood pressure at home daily, write it down.  Bring blood pressure log to appointment with pharmacist in 2 weeks.   Ok to hold Eliquis for 2  days prior to dental extractions.  Resume as soon as ok with dentist.    Signed, Little Ishikawa, MD  07/27/2021 9:47 PM    [facility]

## 2021-07-26 NOTE — Therapy (Signed)
[facility] [address] [place], [place], [address] Phone: [phone]   Fax:  [phone]  Physical Therapy Treatment/Recert  Patient Details  Name: Connor [name] MRN: [id number] Date of Birth: 04-27-1956 Referring Provider (PT): Dr. Lajoyce Corners   Encounter Date: 07/26/2021   PT End of Session - 07/26/21 1438     Visit Number 8    Number of Visits 16    Date for PT Re-Evaluation 09/22/21    Authorization Type Medicaid (has used 9 visit this year so far) 10/26-12/6 12 visits    Authorization - Visit Number 7    Authorization - Number of Visits 12    PT Start Time 1101    PT Stop Time 1145    PT Time Calculation (min) 44 min    Equipment Utilized During Treatment Gait belt    Activity Tolerance Patient tolerated treatment well    Behavior During Therapy WFL for tasks assessed/performed             Past Medical History:  Diagnosis Date   AAA (abdominal aortic aneurysm)    History of open heart surgery    Hypertension    Seroma due to trauma [facility])     Past Surgical History:  Procedure Laterality Date   ABDOMINAL AORTIC ANEURYSM REPAIR     AMPUTATION Left 09/07/2020   Procedure: ATTEMPTED LEFT LEG DEBRIDEMENT FASCIOTOMIES, APPLY INSTILLATION WOUND VAC, ABOVE KNEE AMPUTATION;  Surgeon: Nadara Mustard, MD;  Location: [facility] OR;  Service: Orthopedics;  Laterality: Left;   BUBBLE STUDY  09/05/2020   Procedure: BUBBLE STUDY;  Surgeon: Meriam Sprague, MD;  Location: [facility] ENDOSCOPY;  Service: Cardiovascular;;   CARDIOVERSION N/A 02/11/2020   Procedure: CARDIOVERSION;  Surgeon: Little Ishikawa, MD;  Location: [facility] ENDOSCOPY;  Service: Cardiovascular;  Laterality: N/A;   CORONARY ARTERY BYPASS GRAFT     STUMP REVISION Left 09/09/2020   Procedure: REVISION LEFT ABOVE KNEE AMPUTATION;  Surgeon: Nadara Mustard, MD;  Location: [facility] OR;  Service: Orthopedics;  Laterality: Left;   TEE WITHOUT CARDIOVERSION N/A 09/05/2020    Procedure: TRANSESOPHAGEAL ECHOCARDIOGRAM (TEE);  Surgeon: Meriam Sprague, MD;  Location: [facility] ENDOSCOPY;  Service: Cardiovascular;  Laterality: N/A;    There were no vitals filed for this visit.   Subjective Assessment - 07/26/21 1106     Subjective Pt reports he is feeling good today but just a little sore from working out his upper body at the gym.    Pertinent History atrial fibrillation (on eliquis), CHF, AKI (previously requiring CRRT and has since recovered), s/p left AKA for infection and a type A aortic dissection s/p open repair (2016 at [facility]) with recurrence at the distal anastomosis s/p frozen elephant trunk and total arch replacement (2019, [facility]), left renal artery and left common and external iliac stenting (08/2018), TEVAR extension and LRA stenting via bilateral CFA on 02/06/21 [facility]).    Patient Stated Goals Improve walking    Currently in Pain? No/denies    Pain Onset 1 to 4 weeks ago                               [facility] Adult PT Treatment/Exercise - 07/26/21 1109       Transfers   Transfers Sit to Stand;Stand to Sit    Sit to Stand 5: Supervision    Stand to Sit 5: Supervision      Ambulation/Gait  Ambulation/Gait Yes    Ambulation/Gait Assistance 5: Supervision    Ambulation/Gait Assistance Details Pt's O2 sat dropped to 88 after walking 3.5 laps. Pt took extended rest break and performed pursed lipped breathing and got up to baseline.    Ambulation Distance (Feet) 402.5 Feet    Assistive device Rolling walker    Gait Pattern Step-through pattern;Decreased stance time - left    Ambulation Surface Level;Unlevel    Gait velocity 24.16sec= .41 m/s w/ RW and 35.5sec=.23m/s w/ quad cane    Stairs Yes    Stairs Assistance 5: Supervision    Stairs Assistance Details (indicate cue type and reason) Pt's leg unlocked while descending but was able to correct himself    Stair Management Technique Step to pattern;Two rails     Number of Stairs 4    Height of Stairs 6    Ramp 5: Supervision    Ramp Details (indicate cue type and reason) Pt was able to go up and down ramp w/ rolling walker supervision. PT provided verbal cues to maintain weight on heels.      Berg Balance Test   Sit to Stand Able to stand  independently using hands    Standing Unsupported Able to stand safely 2 minutes    Sitting with Back Unsupported but Feet Supported on Floor or Stool Able to sit safely and securely 2 minutes    Stand to Sit Sits safely with minimal use of hands    Transfers Able to transfer safely, definite need of hands    Standing Unsupported with Eyes Closed Able to stand 10 seconds safely    Standing Ubsupported with Feet Together Able to place feet together independently and stand 1 minute safely    From Standing, Reach Forward with Outstretched Arm Can reach forward >12 cm safely (5")    From Standing Position, Pick up Object from Floor Able to pick up shoe safely and easily    From Standing Position, Turn to Look Behind Over each Shoulder Looks behind from both sides and weight shifts well    Turn 360 Degrees Able to turn 360 degrees safely but slowly    Standing Unsupported, Alternately Place Feet on Step/Stool Able to complete >2 steps/needs minimal assist    Standing Unsupported, One Foot in Front Able to take small step independently and hold 30 seconds    Standing on One Leg Tries to lift leg/unable to hold 3 seconds but remains standing independently    Total Score 43                       PT Short Term Goals - 07/05/21 1350       PT SHORT TERM GOAL #1   Title Pt will be able to donn/doff prosthesis independently for improved mobility.    Baseline 06/08/21 Pt needs some assist to correctly donn prosthesis. Not sitting all the way down in socket at eval due to pants in way. 07/05/21 Pt still needed some help with correcting porthesis fitment, educated on proper shrinkage use and corrected number of  socks being worn to improve fit.    Time 4    Period Weeks    Status On-going    Target Date 07/06/21      PT SHORT TERM GOAL #2   Title Berg Balance will be performed and LTG written to further assess balance.    Baseline 06/21/21 Berg performed and LTG written.    Time 4  Period Weeks    Status Achieved    Target Date 07/06/21      PT SHORT TERM GOAL #3   Title Pt will decrease TUG from 53 sec to <48 sec for improved balance and functional mobility.    Baseline 06/08/21 53 sec with RW, 07/05/21 38.83 average between two trials with RW    Time 4    Period Weeks    Status Achieved    Target Date 07/06/21      PT SHORT TERM GOAL #4   Title Pt will ambulate >300' with RW on level surfaces for improved household and short community distances mod I.    Baseline 06/08/21 115' with RW CGA, 07/05/21 345' w/ RW    Time 4    Period Weeks    Status Achieved    Target Date 07/06/21               PT Long Term Goals - 07/26/21 1447       PT LONG TERM GOAL #1   Title Pt will be independent with HEP for strengthening, ROM and balance to continue gains on own. (LTGs due 08/04/21)    Baseline need to update old HEP prior to hospitalization    Time 8    Period Weeks    Status On-going      PT LONG TERM GOAL #2   Title Pt will ambulate >400' on varied indoor and outdoor level surfaces with RW supervision for improved short community distances.    Baseline 115' with RW mod I 11/30-pt was able to ambulate 402.5' w/ RW supervision    Time 8    Period Weeks    Status Achieved      PT LONG TERM GOAL #3   Title Pt will ambulate up/down steps with railings, up/down curb and ramp with RW supervision for improved community access.    Baseline not performed    Time 8    Period Weeks    Status Achieved      PT LONG TERM GOAL #4   Title Pt will increase gait speed from 0.4m/s to >0.41m/s for improved household safety.    Baseline 0.63m/s on 06/08/21 07/26/21- .41 m/s    Time 8     Period Weeks    Status Achieved      PT LONG TERM GOAL #5   Title Pt will increase Berg Balance score from 38/56 to >42/56 for improved balance and decreased fall risk.    Baseline 06/21/21 38/56 07/26/21- 43/56    Time 8    Period Weeks    Status Achieved            Updated PT Goals   PT Short Term Goals - 07/26/21 1634       PT SHORT TERM GOAL #1   Title Pt will be independent with initial HEP focused on balance and BLE strengthening.    Baseline None provided    Time 4    Period Weeks    Status New    Target Date 08/25/21      PT SHORT TERM GOAL #2   Title Pt will be able to ambulate >/= 245ft w/ quad cane supervision for improved community ambulation.    Baseline 07/26/21- 402.69ft w/ RW    Time 4    Period Weeks    Status New    Target Date 08/25/21      PT SHORT TERM GOAL #3   Title Pt will improve his  gait speed to >/= .4 m/s for higher quality gait.    Baseline 07/26/21- .50m/s w/ quad cane    Time 4    Period Weeks    Status New    Target Date 08/25/21      PT SHORT TERM GOAL #4   Title Pt will be able independently don/doff prothesis for higher quality ADLs.    Baseline 07/26/21- still requires some assitance to make proper sock and fit adjustments    Time 4    Period Weeks    Status New    Target Date 08/25/21              PT Long Term Goals - 07/26/21 1649       PT LONG TERM GOAL #1   Title Pt will be independent w/ final HEP w/ continued emphasis on BLE strengthening and balance    Baseline 07/26/21- none provided    Time 8    Period Weeks    Status New    Target Date 09/22/21      PT LONG TERM GOAL #2   Title Pt will be able to ambulate >/= 400' w/ quad cane supervision over varied level terrain for improved community ambulation.    Baseline 11/30- 402.5 w/ RW    Time 8    Period Weeks    Status New    Target Date 09/22/21      PT LONG TERM GOAL #3   Title Pt will be able to ambulate at a gait speed of >/= .75m/s w/ quad cane  for improved community ambulation.    Baseline 07/26/21- .50m/s    Time 8    Period Weeks    Status New    Target Date 09/22/21      PT LONG TERM GOAL #4   Title Pt will improve Berg Balance score to >/= 45/56 demonstrating a lower fall risk.    Baseline 07/26/21- 43/56    Time 8    Period Weeks    Status New    Target Date 09/22/21                  Plan - 07/26/21 1459     Clinical Impression Statement Today's PT session focused on checking pt's LTG for re-cert. 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 Pt was able to improve his Berg Balance test score to 43/56 lowering pt's fall risk. Pt was able to navigate stair using handrails w/ only supervision from PT demosntrating improved functional mobility and strength. Pt demonstarted improved community mobiity being able to ambulate 402.71ft and navigate ramp w/ RW supervision. Pt continues to demonstrates deficits in balance espeically while ambulating w/ quad cane or while supporting himself on prosthesis. Pt and PT are in agreeance to extend POC to facilitate pt walking w/ quad cane and towards reaching pt's revised LTG.    Personal Factors and Comorbidities Comorbidity 3+    Comorbidities MHx HTN, CHF with EF 60-65%, CAD s/p CABG, CKD stage II, Diabetes with hx of L AKA, Anemia, A fib on Eliquis, and hx of abdominal aortic aneurysm repair   09/09/20 revision left AKA    Examination-Activity Limitations Locomotion Level;Transfers;Stairs;Stand;Squat    Examination-Participation Restrictions Cleaning;Community Activity;Yard Work    Stability/Clinical Decision Making Evolving/Moderate complexity    Rehab Potential Good    PT Frequency 1x / week    PT Duration 8 weeks  PT Treatment/Interventions ADLs/Self Care Home Management;Cryotherapy;Moist Heat;Gait training;Stair training;Functional mobility training;Therapeutic activities;Therapeutic exercise;Balance  training;Manual techniques;Prosthetic Training;Patient/family education;Neuromuscular re-education;Passive range of motion;Energy conservation;DME Instruction    PT Next Visit Plan Provided pt with HEP focused on BLE strengthening and balance. Check prothesis fit at beginning of next session. Monitor vitals. O2 did desat some at eval with activity but just to 93%. Did pt talk to cardiologist to get diuretics figured out? Prosthetic training.  Gait training with RW trying to continue to decrease support to 1UE in // bars and possibly to quad cane when ready, balance training.    Consulted and Agree with Plan of Care Patient             Patient will benefit from skilled therapeutic intervention in order to improve the following deficits and impairments:  Abnormal gait, Cardiopulmonary status limiting activity, Decreased activity tolerance, Decreased balance, Decreased mobility, Decreased knowledge of use of DME, Decreased strength, Increased edema, Decreased endurance, Decreased range of motion, Difficulty walking  Visit Diagnosis: Other abnormalities of gait and mobility  Muscle weakness (generalized)  Unsteadiness on feet  Abnormal posture     Problem List Patient Active Problem List   Diagnosis Date Noted   Acute pulmonary edema (HCC)    Acute on chronic diastolic (congestive) heart failure (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) 09/01/2020   Severe sepsis with acute organ dysfunction due to Streptococcus species ([place]) 08/30/2020   Cellulitis of left leg 08/30/2020   Lactic acidosis 08/30/2020   Elevated troponin 08/30/2020   Hypoalbuminemia 08/30/2020   Leukocytosis 08/30/2020   Thrombocytopenia ([place]) 08/30/2020   Hypokalemia 08/30/2020   Hyperglycemia 08/30/2020   Transaminitis 08/30/2020   Total bilirubin, elevated 08/30/2020   AKI (acute kidney injury) ([place]) 08/30/2020   Nausea vomiting and diarrhea 08/30/2020   Hypertension 05/20/2020   Persistent atrial fibrillation [facility])     Lottie Mussel, Student-PT 07/26/2021, 4:32 PM  [place] [address] [place], [place], [address] Phone: [phone]   Fax:  [phone]  Name: Connor [name] MRN: [id number] Date of Birth: 15-Oct-1955

## 2021-07-27 ENCOUNTER — Ambulatory Visit: Payer: Medicaid Other | Admitting: Cardiology

## 2021-07-27 ENCOUNTER — Encounter: Payer: Self-pay | Admitting: Cardiology

## 2021-07-27 VITALS — BP 138/80 | HR 65 | Ht 71.0 in | Wt 238.8 lb

## 2021-07-27 DIAGNOSIS — I7100 Dissection of unspecified site of aorta: Secondary | ICD-10-CM

## 2021-07-27 DIAGNOSIS — I4819 Other persistent atrial fibrillation: Secondary | ICD-10-CM

## 2021-07-27 DIAGNOSIS — Z01818 Encounter for other preprocedural examination: Secondary | ICD-10-CM | POA: Diagnosis not present

## 2021-07-27 DIAGNOSIS — I10 Essential (primary) hypertension: Secondary | ICD-10-CM | POA: Diagnosis not present

## 2021-07-27 DIAGNOSIS — Z13220 Encounter for screening for lipoid disorders: Secondary | ICD-10-CM | POA: Diagnosis not present

## 2021-07-27 DIAGNOSIS — G4733 Obstructive sleep apnea (adult) (pediatric): Secondary | ICD-10-CM

## 2021-07-27 LAB — SPECIMEN STATUS REPORT

## 2021-07-27 NOTE — Patient Instructions (Addendum)
Medication Instructions:  Your physician recommends that you continue on your current medications as directed. Please refer to the Current Medication list given to you today.  *If you need a refill on your cardiac medications before your next appointment, please call your pharmacy*   Lab Work: CMET, CBC, Lipid, BNP today  If you have labs (blood work) drawn today and your tests are completely normal, you will receive your results only by: MyChart Message (if you have MyChart) OR A paper copy in the mail If you have any lab test that is abnormal or we need to change your treatment, we will call you to review the results.  Follow-Up: At [facility], you and your health needs are our priority.  As part of our continuing mission to provide you with exceptional heart care, we have created designated Provider Care Teams.  These Care Teams include your primary Cardiologist (physician) and Advanced Practice Providers (APPs -  Physician Assistants and Nurse Practitioners) who all work together to provide you with the care you need, when you need it.  We recommend signing up for the patient portal called "MyChart".  Sign up information is provided on this After Visit Summary.  MyChart is used to connect with patients for Virtual Visits (Telemedicine).  Patients are able to view lab/test results, encounter notes, upcoming appointments, etc.  Non-urgent messages can be sent to your provider as well.   To learn more about what you can do with MyChart, go to [email or web address].    Your next appointment:   PharmD in 2 weeks 3 months with Dr. Bjorn Pippin  Other Instructions Please check your blood pressure at home daily, write it down.  Bring blood pressure log to appointment with pharmacist in 2 weeks.   Ok to hold Eliquis for 2 days prior to dental extractions.  Resume as soon as ok with dentist.

## 2021-07-28 ENCOUNTER — Telehealth: Payer: Self-pay

## 2021-07-28 LAB — COMPREHENSIVE METABOLIC PANEL
ALT: 16 IU/L (ref 0–44)
AST: 26 IU/L (ref 0–40)
Albumin/Globulin Ratio: 1.6 (ref 1.2–2.2)
Albumin: 4.4 g/dL (ref 3.8–4.8)
Alkaline Phosphatase: 80 IU/L (ref 44–121)
BUN/Creatinine Ratio: 15 (ref 10–24)
BUN: 18 mg/dL (ref 8–27)
Bilirubin Total: 0.7 mg/dL (ref 0.0–1.2)
CO2: 25 mmol/L (ref 20–29)
Calcium: 8.8 mg/dL (ref 8.6–10.2)
Chloride: 104 mmol/L (ref 96–106)
Creatinine, Ser: 1.2 mg/dL (ref 0.76–1.27)
Globulin, Total: 2.7 g/dL (ref 1.5–4.5)
Glucose: 82 mg/dL (ref 70–99)
Potassium: 4.1 mmol/L (ref 3.5–5.2)
Sodium: 140 mmol/L (ref 134–144)
Total Protein: 7.1 g/dL (ref 6.0–8.5)
eGFR: 68 mL/min/{1.73_m2} (ref >59)

## 2021-07-28 LAB — LIPID PANEL
Chol/HDL Ratio: 3.2 ratio (ref 0.0–5.0)
Cholesterol, Total: 148 mg/dL (ref 100–199)
HDL: 46 mg/dL (ref >39)
LDL Chol Calc (NIH): 91 mg/dL (ref 0–99)
Triglycerides: 50 mg/dL (ref 0–149)
VLDL Cholesterol Cal: 11 mg/dL (ref 5–40)

## 2021-07-28 LAB — CBC
Hematocrit: 30.9 % — ABNORMAL LOW (ref 37.5–51.0)
Hemoglobin: 9.7 g/dL — ABNORMAL LOW (ref 13.0–17.7)
MCH: 29.5 pg (ref 26.6–33.0)
MCHC: 31.4 g/dL — ABNORMAL LOW (ref 31.5–35.7)
MCV: 94 fL (ref 79–97)
Platelets: 152 10*3/uL (ref 150–450)
RBC: 3.29 x10E6/uL — ABNORMAL LOW (ref 4.14–5.80)
RDW: 12.4 % (ref 11.6–15.4)
WBC: 4.8 10*3/uL (ref 3.4–10.8)

## 2021-07-28 LAB — BRAIN NATRIURETIC PEPTIDE: BNP: 214.6 pg/mL — ABNORMAL HIGH (ref 0.0–100.0)

## 2021-07-28 NOTE — Telephone Encounter (Signed)
PT called to notify pt that his new medicaid- [employer] complete, is not accepted at [facility] in [facility]. Gave option of self pay or that he could transfer to sister clinic at [facility] in [place]. He can not afford to self pay and would like to try to transfer. PT will contact clinic at [facility] to see if they can get pt authorized there and scheduled. Connor [name], PT, DPT, NCS

## 2021-07-31 ENCOUNTER — Encounter: Payer: Self-pay | Admitting: *Deleted

## 2021-08-02 ENCOUNTER — Ambulatory Visit: Payer: Medicaid Other

## 2021-08-03 ENCOUNTER — Other Ambulatory Visit: Payer: Self-pay | Admitting: Cardiology

## 2021-08-03 DIAGNOSIS — G4733 Obstructive sleep apnea (adult) (pediatric): Secondary | ICD-10-CM

## 2021-08-03 DIAGNOSIS — I10 Essential (primary) hypertension: Secondary | ICD-10-CM

## 2021-08-03 DIAGNOSIS — I480 Paroxysmal atrial fibrillation: Secondary | ICD-10-CM

## 2021-08-04 ENCOUNTER — Telehealth: Payer: Self-pay | Admitting: *Deleted

## 2021-08-04 NOTE — Telephone Encounter (Signed)
Message left for patient to return a call to discuss his sleep study appointment details.

## 2021-08-15 ENCOUNTER — Other Ambulatory Visit: Payer: Self-pay

## 2021-08-15 ENCOUNTER — Ambulatory Visit (INDEPENDENT_AMBULATORY_CARE_PROVIDER_SITE_OTHER): Payer: Medicaid Other | Admitting: Pharmacist

## 2021-08-15 VITALS — BP 142/92 | HR 75

## 2021-08-15 DIAGNOSIS — I714 Abdominal aortic aneurysm, without rupture, unspecified: Secondary | ICD-10-CM

## 2021-08-15 DIAGNOSIS — I10 Essential (primary) hypertension: Secondary | ICD-10-CM | POA: Diagnosis not present

## 2021-08-15 DIAGNOSIS — I712 Thoracic aortic aneurysm, without rupture, unspecified: Secondary | ICD-10-CM | POA: Diagnosis not present

## 2021-08-15 DIAGNOSIS — Z7901 Long term (current) use of anticoagulants: Secondary | ICD-10-CM

## 2021-08-15 MED ORDER — SPIRONOLACTONE 50 MG PO TABS: 50.0000 mg | ORAL_TABLET | Freq: Every day | ORAL | 11 refills | Status: AC

## 2021-08-15 NOTE — Progress Notes (Signed)
Patient ID: Jam Lana                 DOB: 02/23/1956                      MRN: [id number]     HPI: Connor [name] is a 65 y.o. male referred by Dr. Gardiner Rhyme to HTN clinic. PMH is significant for aortic dissection s/p repair 2016, dissection at anastomosis suture lines in 2019 s/p left common and external iliac artery and left renal vein stents 99991111 complicated by cardiogenic shock, AKI, and extensive left upper extremity DVT. Underwent total arch replacement, frozen elephant trunk, and head vessel reconstruction 09/2018. Also has hx of afib s/p DCCV 01/2020, necrotizing fasciitis of his left leg s/p AKA, resistant HTN with likely hyperaldosteronism and hx of hypokalemia, acute on chronic diastolic HF (echo 0000000 with EF 60-65%, severe LVH, G2DD), and type B aortic dissection and hypertensive crisis 01/2021 s/p TEVAR extension and left renal artery stenting, follows with [facility] vascular surgery.  Pt presents today for follow up. Ambulates in a wheelchair due to left AKA. Reports tolerating his medications well. Reports feeling better since Lasix dose was doubled to 80mg  daily. SOB and swelling have improved. Unclear on timeline of dose increase of Lasix, per recent cardiology notes this was right at the beginning of December which pt agrees with, but then states that he started increasing his potassium supplement to 2 tabs daily back in July when his Lasix was increased. Essentially states when he takes 2 Lasix he'll take 2 potassium tablets, and when he takes 1 Lasix he'll take 1 potassium tablet. Called his pharmacy who states they've been filling 2 of the 40mg  tablets of Lasix each day but just 1 of the potassium 95meq tablets daily. Still unclear.  Has his meds pill packaged and delivered to his home. Reports Eliquis has been expensive. Upon further inquiry, most recent copays have been $3-4 with his Medicaid insurance. Occasionally gets dizzy 1-2x a week either in the morning ~1 hour after taking his  meds or around noon. Takes once daily meds in the morning.  Home BP readings checked twice daily, a few hours after taking his meds and then wakes up in the middle of the night to check it (laying down for these readings). Generally lower during the day and higher in the middle of the night.   Daytime readings: 131/71, 137/94, 139/87, 138/86, 130/88, 132/88, 137/88, 138/79, 138/77, 128/88, 140/88, 139/83, 140/92, 150/91, 127/80, 137/74.   Night time readings: 144/96, 155/90, 157/91, 144/97, 157/91, 144/94, 151/90, 159/90, 161/97, 157/89, 161/93, 144/91, 161/92, 167/101, 171/99, 144/90.  Current HTN meds: diltiazem 240mg  daily (6am, afib), furosemide 80mg  daily (diastolic CHF), hydralazine 50mg  TID, Imdur 30mg  daily (6am), labetalol 400mg  BID, spironolactone 25mg  daily (6am, also on Klor Con 48meq daily due to hypoK)  BP goal: <130/66mmHg  Family History: Mother died of MI at age 46.  Father had stroke at age 46  Social History: Smokes 1/2 PPD, denies alcohol and drug use.  Diet: Rinses canned vegetables, less meat, 1 cup of coffee. Fresh veggies at home.  Wt Readings from Last 3 Encounters:  07/27/21 238 lb 12.8 oz (108.3 kg)  03/30/21 224 lb (101.6 kg)  03/25/21 241 lb (109.3 kg)   BP Readings from Last 3 Encounters:  07/27/21 138/80  06/08/21 (!) 148/78  03/30/21 126/68   Pulse Readings from Last 3 Encounters:  07/27/21 65  07/12/21 (!) 53  07/05/21 61  Renal function: CrCl cannot be calculated (Unknown ideal weight.).  Past Medical History:  Diagnosis Date   AAA (abdominal aortic aneurysm)    History of open heart surgery    Hypertension    Seroma due to trauma [facility])     Current Outpatient Medications on File Prior to Visit  Medication Sig Dispense Refill   acetaminophen (TYLENOL) 325 MG tablet Take 2 tablets (650 mg total) by mouth every 6 (six) hours as needed for moderate pain.     amoxicillin (AMOXIL) 500 MG capsule Take 1,000 mg by mouth 2 (two) times daily.      aspirin EC 81 MG tablet Take 81 mg by mouth daily. Swallow whole.     diclofenac Sodium (VOLTAREN) 1 % GEL APPLY 2 G TOPICALLY FOUR TIMES DAILY. 200 g 0   diltiazem (CARDIZEM CD) 240 MG 24 hr capsule TAKE 1 CAPSULE (240 MG TOTAL) BY MOUTH DAILY. 30 capsule 0   diltiazem (DILACOR XR) 240 MG 24 hr capsule Take 240 mg by mouth daily.     ELIQUIS 5 MG TABS tablet TAKE 1 TABLET (5 MG TOTAL) BY MOUTH 2 (TWO) TIMES DAILY. 180 tablet 3   furosemide (LASIX) 80 MG tablet Take 80 mg by mouth daily.     gabapentin (NEURONTIN) 400 MG capsule TAKE 1 CAPSULE (400 MG TOTAL) BY MOUTH THREE TIMES DAILY. 90 capsule 0   hydrALAZINE (APRESOLINE) 50 MG tablet Take 1 tablet by mouth in the morning, at noon, and at bedtime.     HYDROcodone-acetaminophen (NORCO/VICODIN) 5-325 MG tablet Take 1 tablet by mouth 3 (three) times daily as needed.     isosorbide mononitrate (IMDUR) 30 MG 24 hr tablet Take 1 tablet (30 mg total) by mouth daily. 30 tablet 2   labetalol (NORMODYNE) 200 MG tablet TAKE 2 TABLETS (400 MG TOTAL) BY MOUTH TWO TIMES DAILY. 360 tablet 3   Multiple Vitamins-Minerals (MULTIVITAMIN WITH MINERALS) tablet Take 1 tablet by mouth daily. Men     ondansetron (ZOFRAN) 4 MG tablet Take 1 tablet (4 mg total) by mouth every 8 (eight) hours as needed for nausea or vomiting. 30 tablet 0   oxyCODONE (OXY IR/ROXICODONE) 5 MG immediate release tablet Take 1 tablet (5 mg total) by mouth every 6 (six) hours as needed for severe pain. 10 tablet 0   potassium chloride SA (KLOR-CON) 20 MEQ tablet Take 20 mEq by mouth daily.     spironolactone (ALDACTONE) 25 MG tablet Take 1 tablet (25 mg total) by mouth daily. 90 tablet 1   traZODone (DESYREL) 100 MG tablet Take 100 mg by mouth at bedtime.     No current facility-administered medications on file prior to visit.    No Known Allergies   Assessment/Plan:  1. Hypertension - BP remains elevated above goal <130/90mmHg. Will increase spironolactone to 50mg  daily for his HTN  and hyperaldosteronism. Called his pill pack pharmacy who will send out rx for additional 25mg  tabs to add to his current pill pack that already contains 25mg  tabs until his next month supply comes in so that he can start on dose increase now. Advised him to move his diltiazem and Imdur to the evening to see if it helps improve his dizziness in the AM (had been taking all daily meds in the AM, dizziness potentially due to same onset of action of multiple BP lowering drugs). Will continue diltiazem 240mg  daily, furosemide 80mg  daily, hydralazine 50mg  TID, Imdur 30mg  daily, and labetalol 400mg  BID. Pt encouraged to limit sodium  to 000mg  daily. Will follow up in clinic in 3 weeks for BP check and BMET. Encouraged pt to bring home BP cuff to that visit to verify accuracy. Advised pt that he does not need to wake up in the middle of the night to check his BP and that checking during the daytime when he's awake is more than fine. Once spironolactone is titrated up to 100mg  daily,  could consider addition of SGLT2i given his diastolic HF.  Stevee Valenta E. March Joos, PharmD, BCACP, [place] [id number], [place], [place] [address] Phone: [phone]; Fax: [phone] 08/15/2021 3:32 PM

## 2021-08-15 NOTE — Patient Instructions (Addendum)
It was nice to see you today!  Your blood pressure goal is < 130/93mmHg  Increase your spironolactone from 25mg  to 50mg  once daily  Continue taking your other medications and monitor/record your blood pressure at home  Please bring in your blood pressure log and your readings to your next visit on January 12th. We'll check your lab work at this visit, too

## 2021-08-16 ENCOUNTER — Ambulatory Visit: Payer: Medicaid Other

## 2021-09-07 ENCOUNTER — Ambulatory Visit (INDEPENDENT_AMBULATORY_CARE_PROVIDER_SITE_OTHER): Payer: Medicaid Other | Admitting: Pharmacist

## 2021-09-07 ENCOUNTER — Other Ambulatory Visit: Payer: Self-pay

## 2021-09-07 VITALS — BP 144/84 | HR 63

## 2021-09-07 DIAGNOSIS — I10 Essential (primary) hypertension: Secondary | ICD-10-CM | POA: Diagnosis not present

## 2021-09-07 NOTE — Progress Notes (Signed)
Patient ID: Connor [name]                 DOB: 1956-03-01                      MRN: [id number]     HPI: Avian Gaster is a 66 y.o. male referred by Dr. Gardiner Rhyme to HTN clinic. PMH is significant for aortic dissection s/p repair 2016, dissection at anastomosis suture lines in 2019 s/p left common and external iliac artery and left renal vein stents [id number] complicated by cardiogenic shock, AKI, and extensive left upper extremity DVT. Underwent total arch replacement, frozen elephant trunk, and head vessel reconstruction 09/2018. 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  At previous follow up on 12/20, was ambulating well in a wheelchair due to left AKA. Reported tolerating his medications well and feeling better since Lasix dose was doubled to 80mg  daily. SOB and swelling had improved. Occasionally felt dizzy 1-2x a week either in the morning ~1 hour after taking his meds or around noon. Unclear on timeline of dose increase of Lasix, per recent cardiology notes this was right at the beginning of December which pt agreed with, but then stated that he started increasing his potassium supplement to 2 tabs daily back in July when his Lasix was increased. Essentially stated when he takes 2 Lasix he'll take 2 potassium tablets, and when he takes 1 Lasix he'll take 1 potassium tablet. Called his pharmacy who states they've been filling 2 of the 40mg  tablets of Lasix each day but just 1 of the potassium 81meq tablets daily. Still unclear.  BP was elevated at 142/92 and I increased his spironolactone from 25 mg to 50 mg daily. Advised him to move his diltiazem and Imdur to the evening to  see if it would help improve his dizziness in the AM (had been taking all daily meds in the AM, dizziness potentially due to same onset of action of multiple BP lowering drugs). Branded medication copays ~$3-4 with his Medicaid insurance.  At today's visit, pt reports adherence to medications and is tolerating dose increase of spironolactone well. Pill pack service has sent out higher dose with his most recent refill. Pt also clarified again that when he takes 80 mg of Lasix he'll take 40 mEq of potassium, and when he takes 40 mg of Lasix he'll take 20 mEq of potassium. Endorses that his swelling has decreased. Reports dizziness resolved after moving Imdur and diltiazem doses to the evening. For exercise, he enjoys [facility] with [facility], but has not been able to in the past week. Pt is no longer waking up in the middle of the night to check BP.  Home blood pressures appear to be trending down over the past few weeks (high of 166/106 and low of 105/75 most recently). He admits he sometimes forgets 2nd dose of hydralazine if he's out for the day. Took medications a little later this morning around noon, but normally takes them around 9:00 AM. He was also under anesthesia for a dental procedure yesterday. He brought his home BP monitor to compare to clinic readings. Home blood pressure monitor was reporting  around 6-8 mmHg higher than manual readings when directly comparing in clinic today multiple times on both arms.  Current HTN meds:  - diltiazem 240mg  daily (evening, afib) - furosemide 80mg  daily (diastolic CHF) - hydralazine 50mg  TID - Imdur 30mg  daily (evening) - labetalol 400mg  BID - spironolactone 50mg  daily (AM)  BP goal: <130/66mmHg  Family History: Mother died of MI at age 32.  Father had stroke at age 34  Social History: Smokes 1/2 PPD, denies alcohol and drug use.  Diet: Rinses canned vegetables, less meat, 1 cup of coffee. Fresh veggies at home.  Exercise:  goes to senior center with cousin  Home Blood Pressures 115/84, 110/85, 105/75, 131/93; HR 103, 137/79; HR 60, 138/86; HR 65, 129/80; HR 65, 156/108; HR 94, 158/102, 137/90 HR 80, 166/106 HR 90  In-Clinic BP: - Home Monitor (in-clinic - left): 151/94 - Home Monitor (in-clinc - right) 150/84  - Manual Measurement (cuff-right arm): 138/82 - Manual Measurement (cuff- right arm): 144/86 - New Monitor (right arm): 152/92  Wt Readings from Last 3 Encounters:  07/27/21 238 lb 12.8 oz (108.3 kg)  03/30/21 224 lb (101.6 kg)  03/25/21 241 lb (109.3 kg)   BP Readings from Last 3 Encounters:  08/15/21 (!) 142/92  07/27/21 138/80  06/08/21 (!) 148/78   Pulse Readings from Last 3 Encounters:  08/15/21 75  07/27/21 65  07/12/21 (!) 53    Renal function: CrCl cannot be calculated (Patient's most recent lab result is older than the maximum 21 days allowed.).  Past Medical History:  Diagnosis Date   AAA (abdominal aortic aneurysm)    History of open heart surgery    Hypertension    Seroma due to trauma [facility])     Current Outpatient Medications on File Prior to Visit  Medication Sig Dispense Refill   acetaminophen (TYLENOL) 325 MG tablet Take 2 tablets (650 mg total) by mouth every 6 (six) hours as needed for moderate pain.     aspirin EC 81 MG tablet Take 81 mg by mouth daily. Swallow whole.     diclofenac Sodium (VOLTAREN) 1 % GEL APPLY 2 G TOPICALLY FOUR TIMES DAILY. 200 g 0   diltiazem (CARDIZEM CD) 240 MG 24 hr capsule TAKE 1 CAPSULE (240 MG TOTAL) BY MOUTH DAILY. 30 capsule 0   ELIQUIS 5 MG TABS tablet TAKE 1 TABLET (5 MG TOTAL) BY MOUTH 2 (TWO) TIMES DAILY. 180 tablet 3   furosemide (LASIX) 40 MG tablet Take 40 mg by mouth 2 (two) times daily.     gabapentin (NEURONTIN) 400 MG capsule TAKE 1 CAPSULE (400 MG TOTAL) BY MOUTH THREE TIMES DAILY. 90 capsule 0   hydrALAZINE (APRESOLINE) 50 MG tablet Take 1 tablet by mouth in the morning, at noon, and at bedtime.     isosorbide  mononitrate (IMDUR) 30 MG 24 hr tablet Take 1 tablet (30 mg total) by mouth daily. 30 tablet 2   labetalol (NORMODYNE) 200 MG tablet TAKE 2 TABLETS (400 MG TOTAL) BY MOUTH TWO TIMES DAILY. 360 tablet 3   Multiple Vitamins-Minerals (MULTIVITAMIN WITH MINERALS) tablet Take 1 tablet by mouth daily. Men     potassium chloride SA (KLOR-CON) 20 MEQ tablet Take 40 mEq by mouth daily.     spironolactone (ALDACTONE) 50 MG tablet Take 1 tablet (50 mg total) by mouth daily. 30 tablet 11   traZODone (DESYREL) 100 MG tablet Take 100 mg by mouth at bedtime.     No current facility-administered medications on file prior to  visit.    No Known Allergies   Assessment/Plan:  1. Hypertension - BP remains elevated above goal of <130/69mmHg, but home blood pressures are mostly at goal with recent increase in spironolactone from 25 mg to 50 mg daily. Pt prefers to continue on current medications and monitor his blood pressure at home. I will call him in 2 weeks to review home readings. Of note, his automatic cuff measures ~32mmHg higher than manual readings today. Will also call patient tomorrow to review BMET results given recent spironolactone titration. Encourage patient to continue monitoring blood pressures daily and exercising with cousin at [employer], as able.  Jethro Poling, PharmD Candidate 2023  Megan E. Supple, PharmD, BCACP, [place] [id number] [place],  [address] Phone: [phone]; Fax: [phone] 09/07/2021 1:19 PM

## 2021-09-07 NOTE — Patient Instructions (Addendum)
It was nice to see you today!  Your blood pressure goal is < 130/53mmHg  Continue taking your current medications  We'll check your labs today (kidney function and electrolytes) and I'll call you tomorrow with the results

## 2021-09-08 LAB — BASIC METABOLIC PANEL
BUN/Creatinine Ratio: 12 (ref 10–24)
BUN: 15 mg/dL (ref 8–27)
CO2: 26 mmol/L (ref 20–29)
Calcium: 8.8 mg/dL (ref 8.6–10.2)
Chloride: 105 mmol/L (ref 96–106)
Creatinine, Ser: 1.23 mg/dL (ref 0.76–1.27)
Glucose: 84 mg/dL (ref 70–99)
Potassium: 4.2 mmol/L (ref 3.5–5.2)
Sodium: 144 mmol/L (ref 134–144)
eGFR: 66 mL/min/{1.73_m2} (ref >59)

## 2021-09-10 ENCOUNTER — Ambulatory Visit: Payer: Medicaid Other | Attending: Cardiology | Admitting: Cardiovascular Disease

## 2021-09-10 ENCOUNTER — Other Ambulatory Visit: Payer: Self-pay

## 2021-09-10 DIAGNOSIS — I480 Paroxysmal atrial fibrillation: Secondary | ICD-10-CM

## 2021-09-10 DIAGNOSIS — I10 Essential (primary) hypertension: Secondary | ICD-10-CM | POA: Diagnosis not present

## 2021-09-10 DIAGNOSIS — G4733 Obstructive sleep apnea (adult) (pediatric): Secondary | ICD-10-CM

## 2021-09-15 ENCOUNTER — Encounter (HOSPITAL_BASED_OUTPATIENT_CLINIC_OR_DEPARTMENT_OTHER): Payer: Medicaid Other | Admitting: Cardiovascular Disease

## 2021-09-21 ENCOUNTER — Telehealth: Payer: Self-pay | Admitting: Pharmacist

## 2021-09-21 NOTE — Telephone Encounter (Signed)
Called pt to follow up with home BP readings. 140/79, 138/83, 139/88, 180/72 (after missing afternoon meds), 132/88, 133/79, 133/82. Of note, his cuff measured at least higher systolic than manual reading at his last visit. Taking this into account, most readings are at or very close to goal. Has been working on getting in all 3 doses of hydralazine. Has been tolerating medications well. Will continue current meds and he'll keep March follow up with Dr Bjorn Pippin.

## 2021-10-01 ENCOUNTER — Encounter: Payer: Self-pay | Admitting: Cardiovascular Disease

## 2021-10-01 NOTE — Procedures (Signed)
[facility]       Patient Name: Connor, [name] Date: 09/10/2021 Gender: Male D.O.B: Aug 27, 1956 Age (years): 65 Referring Provider: Oswaldo Milian Height (inches): 71 Interpreting Physician: Shelva Majestic MD, ABSM Weight (lbs): 249 RPSGT: Rosebud Poles BMI: 35 MRN: [id number] Neck Size: 19.50  CLINICAL INFORMATION Sleep Study Type: NPSG  Indication for sleep study: snoring, resistant hypertension, non-restorative sleep  Epworth Sleepiness Score: 9  SLEEP STUDY TECHNIQUE As per the AASM Manual for the Scoring of Sleep and Associated Events v2.3 (April 2016) with a hypopnea requiring 4% desaturations.  The channels recorded and monitored were frontal, central and occipital EEG, electrooculogram (EOG), submentalis EMG (chin), nasal and oral airflow, thoracic and abdominal wall motion, anterior tibialis EMG, snore microphone, electrocardiogram, and pulse oximetry.  MEDICATIONS Medications self-administered by patient taken the night of the study : TRAZODONE, Eliquis, FUROSEMIDE, GABAPENTIN, HYDRALAZINE, LABETALOL  SLEEP ARCHITECTURE The study was initiated at 10:33:54 PM and ended at 5:51:27 AM.  Sleep onset time was 31.3 minutes and the sleep efficiency was 81.0%. The total sleep time was 354.3 minutes.  Stage REM latency was 97.5 minutes.  The patient spent 4.80% of the night in stage N1 sleep, 68.25% in stage N2 sleep, 13.83% in stage N3 and 13.1% in REM.  Alpha intrusion was absent.  Supine sleep was 36.61%.  RESPIRATORY PARAMETERS The overall apnea/hypopnea index (AHI) was 12.9 per hour. The respiraqtory disturbance index (RDI) was 12.9/h. There were 1 total apneas, including 1 obstructive, 0 central and 0 mixed apneas. There were 75 hypopneas and 0 RERAs.  The AHI during Stage REM sleep was 61.9 per hour.  AHI while supine was 5.6 per hour.  The mean oxygen saturation was 90.48%. The minimum SpO2 during sleep was  82.00%.  Moderate snoring was noted during this study.  CARDIAC DATA The 2 lead EKG demonstrated sinus rhythm. The mean heart rate was 65.29 beats per minute. Other EKG findings include: PVCs.  LEG MOVEMENT DATA The total PLMS were 0 with a resulting PLMS index of 0.00. Associated arousal with leg movement index was 0.0 .  IMPRESSIONS - Mild obstructive sleep apnea occurred during this study (AHI 12.9/h; RDI 12.9/h); however, events were very severe during REM sleep (AHI 61.9/h). - Moderate oxygen desaturation to a nadir of 82%. - The patient snored with moderate snoring volume. - EKG findings include frequent PVCs. - Clinically significant periodic limb movements did not occur during sleep. No significant associated arousals.  DIAGNOSIS - Obstructive Sleep Apnea (G47.33) - Nocturnal Hypoxemia (G47.36)  RECOMMENDATIONS - In this patient with significant cardiovascular comorbidities, recommend an in-lab therapeutic CPAP titration to determine optimal pressure required to alleviate sleep disordered breathing. If unable to schedule an in-lab study, initiate Auto-PAP with EPR of 3 at 7 - 20 cm of water. - Effort should be made to optimize nasal ands oropharyngeal patency. - Avoid alcohol, sedatives and other CNS depressants that may worsen sleep apnea and disrupt normal sleep architecture. - Sleep hygiene should be reviewed to assess factors that may improve sleep quality. - Weight management (BMI 35) and regular exercise should be initiated or continued if appropriate.  [Electronically signed] 10/01/2021 06:25 PM  Shelva Majestic MD, [facility], [place], American Board of Sleep Medicine   NPI: [id number] [place]: [phone]   FX: [phone]  [place] OF SLEEP MEDICINE

## 2021-10-13 ENCOUNTER — Telehealth: Payer: Self-pay | Admitting: *Deleted

## 2021-10-13 NOTE — Telephone Encounter (Signed)
Left message to return a call to discuss sleep study results and recommendations. 

## 2021-10-13 NOTE — Telephone Encounter (Signed)
-----   Message from Thomas A Kelly, MD sent at 10/01/2021  6:31 PM EST ----- °Makara Lanzo, please notify patient and with his significant cardiovascular comorbidities, recommend an in lab CPAP titration.  If unable to get this approved, then initiate AutoPap as prescribed. °

## 2021-10-13 NOTE — Telephone Encounter (Signed)
-----   Message from Lennette Bihari, MD sent at 10/01/2021  6:31 PM EST ----- Burna Mortimer, please notify patient and with his significant cardiovascular comorbidities, recommend an in lab CPAP titration.  If unable to get this approved, then initiate AutoPap as prescribed.

## 2021-10-20 ENCOUNTER — Telehealth: Payer: Self-pay | Admitting: *Deleted

## 2021-10-20 NOTE — Telephone Encounter (Signed)
Left message to return a call to discuss sleep study results and recommendations. 

## 2021-10-26 ENCOUNTER — Encounter: Payer: Self-pay | Admitting: *Deleted

## 2021-10-26 NOTE — Telephone Encounter (Signed)
Patient has been left 2 phone messages to return a call to discuss sleep study. No return call. Letter sent. ?

## 2021-10-31 DIAGNOSIS — I480 Paroxysmal atrial fibrillation: Secondary | ICD-10-CM | POA: Diagnosis not present

## 2021-10-31 DIAGNOSIS — Z89619 Acquired absence of unspecified leg above knee: Secondary | ICD-10-CM | POA: Diagnosis not present

## 2021-10-31 DIAGNOSIS — Z79899 Other long term (current) drug therapy: Secondary | ICD-10-CM | POA: Diagnosis not present

## 2021-10-31 DIAGNOSIS — I10 Essential (primary) hypertension: Secondary | ICD-10-CM | POA: Diagnosis not present

## 2021-10-31 DIAGNOSIS — E785 Hyperlipidemia, unspecified: Secondary | ICD-10-CM | POA: Diagnosis not present

## 2021-10-31 DIAGNOSIS — I110 Hypertensive heart disease with heart failure: Secondary | ICD-10-CM | POA: Diagnosis not present

## 2021-11-05 NOTE — Progress Notes (Unsigned)
Cardiology Office Note:    Date:  11/05/2021   ID:  Connor [name], DOB 1956-01-09, MRN [id number]  PCP:  Ludwig Clarks, FNP  Cardiologist:  Donato Heinz, MD  Electrophysiologist:  None   Referring MD: Ludwig Clarks, FNP   No chief complaint on file.    History of Present Illness:    Connor [name] is a 66 y.o. male with a hx of aortic dissection who presents for follow-up.  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  He underwent left common and external iliac artery and left renal vein stents on 09/19/2018 in preparation for total aortic arch repair.  Course was complicated by cardiogenic shock (LVEF down to 15%, which has since recovered), AKI requiring CVVH (with subsequent recovery of renal function), extensive left upper extremity DVT.  On 10/03/2018, he underwent total arch replacement, frozen elephant trunk, and head vessel reconstruction with Dr.Takayama and Dr. Posey Pronto.  Course was complicated by sternal wound infection, with cultures growing Klebsiella.  He completed a 14-day course of cefadroxil.  He was following closely with cardiology and nephrology in [place] for blood pressure control.  Moved to [place] in July to help with a family issue.   He was in the ED on 06/19/2019 with chest tightness and shortness of breath.  Troponins negative x2.  CTA chest showed patent graft of the ascending aortic component, patent graft of the proximal descending aorta with, thrombosis of the proximal descending thoracic aortic dissection false lumen along the stent graft, patent type B aortic dissection of the mid descending thoracic aorta extending to the abdominal aortic bifurcation.  Reports that he feels chest pain has been related to muscle cramps from low potassium.  Now taking potassium supplements.  States that he also has chest pain across the incision in the right upper chest when he  does stretches.  TTE on 10/02/2019 showed EF 60-65%, severe LVH, normal RV function, no significant valvular disease.  At clinic visit on 01/05/2020, he was found to be in atrial fibrillation.  Underwent successful DCCV on 02/11/2020.   He was admitted to [facility] from 1/4 through 09/19/2020 with necrotizing fasciitis of the left leg and strep bacteremia.  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  Echocardiogram 09/01/2020 showed EF 60 to 65%, severe LVH, grade 2 diastolic dysfunction, normal RV function, severe left atrial dilatation, moderate right atrial dilatation, no significant valvular disease.  TEE on 09/05/2020 showed no vegetations, positive bubble study.  He was admitted 11/2020 with uncontrolled hypertension and acute on chronic diastolic heart failure, improved with IV Lasix.  He was admitted in June 2022 to [facility] with type B aortic dissection and hypertensive crisis.  On 02/06/2021 he underwent TEVAR extension and left renal artery stenting.  Since his last clinic visit, he reports he has been doing okay.  Denies any chest pain.  Does report some shortness of breath.  Reports occasional lightheadedness, denies any syncope.  Denies any lower extremity edema or palpitations.  Reports BP has been 130s over 70s when checks at home.  Reports he can walk up a flight of stairs without stopping when using his prosthetic leg, denies any chest pain or dyspnea with this.  Has been exercising at the gym doing upper body exercises 3 days/week.   Wt Readings from Last 3 Encounters:  07/27/21 238 lb 12.8 oz (108.3 kg)  03/30/21 224 lb (101.6 kg)  03/25/21 241 lb (109.3 kg)     Past Medical History:  Diagnosis Date   AAA (abdominal aortic aneurysm)    History of open heart surgery    Hypertension    Seroma due to trauma [facility])     Past Surgical History:  Procedure Laterality Date   ABDOMINAL AORTIC ANEURYSM REPAIR     AMPUTATION Left 09/07/2020   Procedure: ATTEMPTED LEFT LEG DEBRIDEMENT FASCIOTOMIES, APPLY  INSTILLATION WOUND VAC, ABOVE KNEE AMPUTATION;  Surgeon: Newt Minion, MD;  Location: [place];  Service: Orthopedics;  Laterality: Left;   BUBBLE STUDY  09/05/2020   Procedure: BUBBLE STUDY;  Surgeon: Freada Bergeron, MD;  Location: [place];  Service: Cardiovascular;;   CARDIOVERSION N/A 02/11/2020   Procedure: CARDIOVERSION;  Surgeon: Donato Heinz, MD;  Location: [place];  Service: Cardiovascular;  Laterality: N/A;   CORONARY ARTERY BYPASS GRAFT     STUMP REVISION Left 09/09/2020   Procedure: REVISION LEFT ABOVE KNEE AMPUTATION;  Surgeon: Newt Minion, MD;  Location: [place];  Service: Orthopedics;  Laterality: Left;   TEE WITHOUT CARDIOVERSION N/A 09/05/2020   Procedure: TRANSESOPHAGEAL ECHOCARDIOGRAM (TEE);  Surgeon: Freada Bergeron, MD;  Location: [facility] ENDOSCOPY;  Service: Cardiovascular;  Laterality: N/A;    Current Medications: No outpatient medications have been marked as taking for the 11/07/21 encounter (Appointment) with Donato Heinz, MD.     Allergies:   Patient has no known allergies.   Social History   Socioeconomic History   Marital status: Single    Spouse name: Not on file   Number of children: Not on file   Years of education: Not on file   Highest education level: Not on file  Occupational History   Not on file  Tobacco Use   Smoking status: Every Day    Packs/day: 0.50    Types: Cigarettes   Smokeless tobacco: Never  Vaping Use   Vaping Use: Never used  Substance and Sexual Activity   Alcohol use: Never   Drug use: Never   Sexual activity: Not on file  Other Topics Concern   Not on file  Social History Narrative   Not on file   Social Determinants of Health   Financial Resource Strain: Not on file  Food Insecurity: Not on file  Transportation Needs: Not on file  Physical Activity: Not on file  Stress: Not on file  Social Connections: Not on file     Family History: Mother died of MI at age 22.  Father had  stroke at age 60  ROS:   Please see the history of present illness.     All other systems reviewed and are negative.  EKGs/Labs/Other Studies Reviewed:    The following studies were reviewed today:   EKG:   07/27/2021: Sinus rhythm, rate 65, first-degree AV block chronic, left axis deviation, no ST abnormalities  TTE 10/02/19:  1. Left ventricular ejection fraction, by visual estimation, is 60 to  65%. The left ventricle has normal function. Left ventricular septal wall  thickness was severely increased. Severely increased left ventricular  posterior wall thickness. There is  severely increased left ventricular hypertrophy.   2. Left ventricular diastolic function could not be evaluated.   3. The left ventricle has no regional wall motion abnormalities.   4. Global right ventricle has normal systolic function.The right  ventricular size is normal. No increase in right ventricular wall  thickness.   5. Left atrial size was moderately dilated.   6.  Right atrial size was normal.   7. The mitral valve is normal in structure. Trivial mitral valve  regurgitation. No evidence of mitral stenosis.   8. The tricuspid valve is normal in structure.   9. The tricuspid valve is normal in structure. Tricuspid valve  regurgitation is not demonstrated.  10. The aortic valve is tricuspid. Aortic valve regurgitation is not  visualized. No evidence of aortic valve sclerosis or stenosis.  11. The pulmonic valve was normal in structure. Pulmonic valve  regurgitation is not visualized.  12. The inferior vena cava is normal in size with greater than 50%  respiratory variability, suggesting right atrial pressure of 3 mmHg.   Recent Labs: 12/09/2020: Magnesium 1.9 07/27/2021: ALT 16; BNP 214.6; Hemoglobin 9.7; Platelets 152 09/07/2021: BUN 15; Creatinine, Ser 1.23; Potassium 4.2; Sodium 144  Recent Lipid Panel    Component Value Date/Time   CHOL 148 07/27/2021 1030   TRIG 50 07/27/2021 1030   HDL 46  07/27/2021 1030   CHOLHDL 3.2 07/27/2021 1030   CHOLHDL NOT CALCULATED 09/04/2020 1446   VLDL 35 09/04/2020 1446   LDLCALC 91 07/27/2021 1030    Physical Exam:    VS:  There were no vitals taken for this visit.    Wt Readings from Last 3 Encounters:  07/27/21 238 lb 12.8 oz (108.3 kg)  03/30/21 224 lb (101.6 kg)  03/25/21 241 lb (109.3 kg)     GEN: Well nourished, well developed in no acute distress HEENT: Normal NECK: No JVD CARDIAC: RRR, 2/6 systolic murmur RESPIRATORY:  Clear to auscultation without rales, wheezing or rhonchi  ABDOMEN: Soft, non-tender, non-distended MUSCULOSKELETAL: Trace edema SKIN: Warm and dry NEUROLOGIC:  Alert and oriented x 3 PSYCHIATRIC:  Normal affect   ASSESSMENT:    No diagnosis found.   PLAN:    Preop evaluation: planning teeth extractions, has to be done under general anesthesia.  Functional capacity greater than 4 METS, denies any exertional symptoms.  RCRI score 0 -No further cardiac work-up recommended prior to procedure.  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  He was admitted in June 2022 to [facility] with type B aortic dissection and hypertensive crisis.  On 02/06/2021 he underwent TEVAR extension and left renal artery stenting. -Follows with [facility] vascular surgery -Goal SBP less than 120, heart rate less than 60.     Persistent atrial fibrillation: At clinic visit on 01/05/2020, he was found to be in atrial fibrillation.  Underwent successful DCCV on 02/11/2020, but subsequently back in AF.  CHADS-VASc score 2 given HTN, vascular disease. TTE shows normal LVEF.  Currently in sinus rhythm. -Continue Eliquis 5 mg  twice daily -Continue diltiazem and labetalol   Resistant hypertension: Currently on spironolactone 50 mg daily, hydralazine 50 mg 3 times daily, diltiazem 240 mg daily, Imdur 30 mg daily, Lasix 80 mg daily, labetalol 400 mg twice daily. -Given resistant hypertension and also issues with persistent hypokalemia, concern for hyperaldosteronism.  Renin/aldosterone ratio >60, have referred to endocrinology and started on spironolactone.  He was seen by endocrinology during admission at [facility] in June 2022, agreed likely had hyperaldosteronism and recommended titrating up spironolactone to 100 mg daily.  Plan to titrate up spironolactone  if BP remains above goal -Suspect untreated OSA also contributing to resistant hypertension, he had a positive sleep study in September but was lost to follow-up.  Will follow-up to ensure he is started on CPAP.   OSA: positive sleep study 04/2020, being started on CPAP   RTC in ***  Medication Adjustments/Labs and Tests Ordered: Current medicines are reviewed at length with the patient today.  Concerns regarding medicines are outlined above.  No orders of the defined types were placed in this encounter.   No orders of the defined types were placed in this encounter.    There are no Patient Instructions on file for this visit.   Signed, Donato Heinz, MD  11/05/2021 4:16 PM    [facility]

## 2021-11-06 ENCOUNTER — Other Ambulatory Visit: Payer: Self-pay | Admitting: Cardiovascular Disease

## 2021-11-06 ENCOUNTER — Telehealth: Payer: Self-pay | Admitting: *Deleted

## 2021-11-06 DIAGNOSIS — G4736 Sleep related hypoventilation in conditions classified elsewhere: Secondary | ICD-10-CM

## 2021-11-06 DIAGNOSIS — G4733 Obstructive sleep apnea (adult) (pediatric): Secondary | ICD-10-CM

## 2021-11-06 NOTE — Telephone Encounter (Signed)
Patient responded to the letter that he received, and was given his sleep study results and recommendations. He agrees to proceed with CPAP titration sleep study scheduled for 12/22/21 @ [facility]. ?

## 2021-11-07 ENCOUNTER — Ambulatory Visit (INDEPENDENT_AMBULATORY_CARE_PROVIDER_SITE_OTHER): Payer: Medicaid Other | Admitting: Cardiology

## 2021-11-07 ENCOUNTER — Other Ambulatory Visit: Payer: Self-pay

## 2021-11-07 VITALS — BP 132/86 | HR 99 | Ht 71.0 in | Wt 251.4 lb

## 2021-11-07 DIAGNOSIS — I4819 Other persistent atrial fibrillation: Secondary | ICD-10-CM

## 2021-11-07 DIAGNOSIS — I10 Essential (primary) hypertension: Secondary | ICD-10-CM

## 2021-11-07 DIAGNOSIS — I712 Thoracic aortic aneurysm, without rupture, unspecified: Secondary | ICD-10-CM | POA: Diagnosis not present

## 2021-11-07 NOTE — Patient Instructions (Signed)
Medication Instructions:  ?No Changes In Medications at this time.  ?*If you need a refill on your cardiac medications before your next appointment, please call your pharmacy* ? ?Lab Work: ?CMET, BNP,  AND MAG TODAY  ?If you have labs (blood work) drawn today and your tests are completely normal, you will receive your results only by: ?MyChart Message (if you have MyChart) OR ?A paper copy in the mail ?If you have any lab test that is abnormal or we need to change your treatment, we will call you to review the results. ? ?Follow-Up: ?At [facility], you and your health needs are our priority.  As part of our continuing mission to provide you with exceptional heart care, we have created designated Provider Care Teams.  These Care Teams include your primary Cardiologist (physician) and Advanced Practice Providers (APPs -  Physician Assistants and Nurse Practitioners) who all work together to provide you with the care you need, when you need it. ? ?We recommend signing up for the patient portal called "MyChart".  Sign up information is provided on this After Visit Summary.  MyChart is used to connect with patients for Virtual Visits (Telemedicine).  Patients are able to view lab/test results, encounter notes, upcoming appointments, etc.  Non-urgent messages can be sent to your provider as well.   ?To learn more about what you can do with MyChart, go to [email or web address].   ? ?Your next appointment:   ?3 month(s) ? ?The format for your next appointment:   ?In Person ? ?Provider:   ?Little Ishikawa, MD   ?  ?

## 2021-11-08 LAB — COMPREHENSIVE METABOLIC PANEL
ALT: 17 IU/L (ref 0–44)
AST: 28 IU/L (ref 0–40)
Albumin/Globulin Ratio: 1.6 (ref 1.2–2.2)
Albumin: 4.5 g/dL (ref 3.8–4.8)
Alkaline Phosphatase: 87 IU/L (ref 44–121)
BUN/Creatinine Ratio: 13 (ref 10–24)
BUN: 20 mg/dL (ref 8–27)
Bilirubin Total: 0.7 mg/dL (ref 0.0–1.2)
CO2: 25 mmol/L (ref 20–29)
Calcium: 9.3 mg/dL (ref 8.6–10.2)
Chloride: 104 mmol/L (ref 96–106)
Creatinine, Ser: 1.55 mg/dL — ABNORMAL HIGH (ref 0.76–1.27)
Globulin, Total: 2.9 g/dL (ref 1.5–4.5)
Glucose: 77 mg/dL (ref 70–99)
Potassium: 4.7 mmol/L (ref 3.5–5.2)
Sodium: 145 mmol/L — ABNORMAL HIGH (ref 134–144)
Total Protein: 7.4 g/dL (ref 6.0–8.5)
eGFR: 50 mL/min/{1.73_m2} — ABNORMAL LOW (ref >59)

## 2021-11-08 LAB — MAGNESIUM: Magnesium: 2 mg/dL (ref 1.6–2.3)

## 2021-11-08 LAB — BRAIN NATRIURETIC PEPTIDE: BNP: 79.6 pg/mL (ref 0.0–100.0)

## 2021-11-10 ENCOUNTER — Other Ambulatory Visit: Payer: Self-pay | Admitting: *Deleted

## 2021-11-10 DIAGNOSIS — I10 Essential (primary) hypertension: Secondary | ICD-10-CM

## 2021-11-10 DIAGNOSIS — R7989 Other specified abnormal findings of blood chemistry: Secondary | ICD-10-CM

## 2021-11-10 DIAGNOSIS — Z79899 Other long term (current) drug therapy: Secondary | ICD-10-CM

## 2021-11-10 MED ORDER — FUROSEMIDE 40 MG PO TABS: 40.0000 mg | ORAL_TABLET | Freq: Every day | ORAL | 3 refills | Status: DC

## 2021-11-15 ENCOUNTER — Other Ambulatory Visit: Payer: Self-pay

## 2021-11-15 DIAGNOSIS — Z79899 Other long term (current) drug therapy: Secondary | ICD-10-CM | POA: Diagnosis not present

## 2021-11-15 DIAGNOSIS — I10 Essential (primary) hypertension: Secondary | ICD-10-CM

## 2021-11-15 DIAGNOSIS — R7989 Other specified abnormal findings of blood chemistry: Secondary | ICD-10-CM

## 2021-11-16 LAB — BASIC METABOLIC PANEL
BUN/Creatinine Ratio: 12 (ref 10–24)
BUN: 16 mg/dL (ref 8–27)
CO2: 22 mmol/L (ref 20–29)
Calcium: 8.7 mg/dL (ref 8.6–10.2)
Chloride: 105 mmol/L (ref 96–106)
Creatinine, Ser: 1.34 mg/dL — ABNORMAL HIGH (ref 0.76–1.27)
Glucose: 81 mg/dL (ref 70–99)
Potassium: 4.3 mmol/L (ref 3.5–5.2)
Sodium: 142 mmol/L (ref 134–144)
eGFR: 59 mL/min/{1.73_m2} — ABNORMAL LOW (ref >59)

## 2021-12-22 ENCOUNTER — Ambulatory Visit: Payer: Medicaid Other | Attending: Cardiovascular Disease | Admitting: Cardiovascular Disease

## 2021-12-22 DIAGNOSIS — G4733 Obstructive sleep apnea (adult) (pediatric): Secondary | ICD-10-CM

## 2021-12-22 DIAGNOSIS — G4736 Sleep related hypoventilation in conditions classified elsewhere: Secondary | ICD-10-CM

## 2021-12-31 ENCOUNTER — Encounter: Payer: Self-pay | Admitting: Cardiovascular Disease

## 2021-12-31 NOTE — Procedures (Signed)
? ? ? ?                                     [facility]     ? ? ? ? ?Patient Name: Connor [name], Connor [name] ?Study Date: 12/22/2021 ?Gender: Male ?D.O.B: 11-27-1955 ?Age (years): 24 ?Referring Provider: Oswaldo Milian ?Height (inches): 71 ?Interpreting Physician: Shelva Majestic MD, ABSM ?Weight (lbs): 249 ?RPSGT: Peak, Bion ?BMI: 35 ?MRN: [id number] ?Neck Size: 19.50 ? ?CLINICAL INFORMATION ?The patient is referred for a CPAP titration to treat sleep apnea. ? ?Date of NPSG:  09/10/2021:  AHI 12.9/h; RDI12.9/h; REM AHI 61.9/h; O2 nadir 82%. ? ?SLEEP STUDY TECHNIQUE ?As per the AASM Manual for the Scoring of Sleep and Associated Events v2.3 (April 2016) with a hypopnea requiring 4% desaturations. ? ?The channels recorded and monitored were frontal, central and occipital EEG, electrooculogram (EOG), submentalis EMG (chin), nasal and oral airflow, thoracic and abdominal wall motion, anterior tibialis EMG, snore microphone, electrocardiogram, and pulse oximetry. Continuous positive airway pressure (CPAP) was initiated at the beginning of the study and titrated to treat sleep-disordered breathing. ? ?MEDICATIONS ?acetaminophen (TYLENOL) 325 MG tablet ?aspirin EC 81 MG tablet ?diltiazem (CARDIZEM CD) 240 MG 24 hr capsule (Expired) ?ELIQUIS 5 MG TABS tablet (Expired) ?furosemide (LASIX) 40 MG tablet ?gabapentin (NEURONTIN) 400 MG capsule (Expired) ?hydrALAZINE (APRESOLINE) 50 MG tablet ?isosorbide mononitrate (IMDUR) 30 MG 24 hr tablet ?labetalol (NORMODYNE) 200 MG tablet (Expired) ?Multiple Vitamins-Minerals (MULTIVITAMIN WITH MINERALS) tablet ?potassium chloride SA (KLOR-CON) 20 MEQ tablet ?spironolactone (ALDACTONE) 50 MG tablet ?traZODone (DESYREL) 100 MG tablet ?Medications self-administered by patient taken the night of the study : TRAZODONE, Eliquis, FUROSEMIDE, GABAPENTIN, HYDRALAZINE, LABETALOL ? ?TECHNICIAN COMMENTS ?Comments added by technician: Patient had difficulty initiating sleep. ?Comments added by scorer:  N/A ? ?RESPIRATORY PARAMETERS ?Optimal PAP Pressure (cm): 10 AHI at Optimal Pressure (/hr): 0.7 ?Overall Minimal O2 (%): 81.00 Supine % at Optimal Pressure (%): 0 ?Minimal O2 at Optimal Pressure (%): 84.0  ? ?SLEEP ARCHITECTURE ?The study was initiated at 9:04:15 PM and ended at 4:30:25 AM. ? ?Sleep onset time was 145.5 minutes and the sleep efficiency was 41.5%. The total sleep time was 185 minutes. ? ?The patient spent 5.95% of the night in stage N1 sleep, 69.46% in stage N2 sleep, 0.00% in stage N3 and 24.6% in REM.Stage REM latency was 94.5 minutes ? ?Wake after sleep onset was 115.6. Alpha intrusion was absent. Supine sleep was 1.64%. ? ?CARDIAC DATA ?The 2 lead EKG demonstrated atrial fibrillation. The mean heart rate was 70.01 beats per minute. Other EKG findings include: None. ? ?LEG MOVEMENT DATA ?The total Periodic Limb Movements of Sleep (PLMS) were 0. The PLMS index was 0.00. A PLMS index of <15 is considered normal in adults. ? ?IMPRESSIONS ?- CPAP was initiated at 5 cm and was titrated to 10 cm of water (AHI 0.7/h; RDI 0.7/h; O2 nadir 84%) ?- Moderate oxygen desaturations were observed during this titration to a nadir of 81% at 7 cm. ?- No snoring was audible during this study. ?- Cardiac abnormalities were observed during this study: Atrial fibrillation, PVCs ?- Clinically significant periodic limb movements were not noted during this study. Arousals associated with PLMs were rare. ? ?DIAGNOSIS ?- Obstructive Sleep Apnea (G47.33) ? ?RECOMMENDATIONS ?- Recommend an initial trial of CPAP Auto therapy with EPR of 3 at 10 - 16 cm of water with heated humidification.  A Wide size Fisher[employer]Paykel Nasal  Evora (Nasal) mask was used for the titration. ?- Effort should be made to optimize nasal and oropharyngeal patency. ?- Avoid alcohol, sedatives and other CNS depressants that may worsen sleep apnea and disrupt normal sleep architecture. ?- Sleep hygiene should be reviewed to assess factors that may improve  sleep quality. ?- Weight management and regular exercise should be initiated or continued. ?- Recommend a download and sleep clinic evaluation after 4 weeks of therapy ? ? ?[Electronically signed] 12/31/2021 12:43 PM ? ?Shelva Majestic MD, [facility], ABSM ?Diplomate, Tax adviser of Sleep Medicine ? ?NPI: [id number] ? ?[place] ?PH: [phone]   FX: [phone] ?ACCREDITED BY THE AMERICAN ACADEMY OF SLEEP MEDICINE ? ?

## 2022-01-15 ENCOUNTER — Telehealth: Payer: Self-pay | Admitting: *Deleted

## 2022-01-15 NOTE — Telephone Encounter (Signed)
-----   Message from Lennette Bihari, MD sent at 12/31/2021 12:47 PM EDT ----- Burna Mortimer, please notify pt of results and contact DME to initiate CPAP

## 2022-01-15 NOTE — Telephone Encounter (Signed)
CPAP order sent to [facility] in [place].

## 2022-01-25 DEATH — deceased

## 2022-02-05 DIAGNOSIS — H52223 Regular astigmatism, bilateral: Secondary | ICD-10-CM | POA: Diagnosis not present

## 2022-02-05 DIAGNOSIS — H524 Presbyopia: Secondary | ICD-10-CM | POA: Diagnosis not present

## 2022-02-25 NOTE — Progress Notes (Unsigned)
Cardiology Office Note:    Date:  02/28/2022   ID:  [name], DOB October 10, 1955, MRN [id number]  PCP:  Oneal Grout, FNP  Cardiologist:  Little Ishikawa, MD  Electrophysiologist:  None   Referring MD: Oneal Grout, FNP   Chief Complaint  Patient presents with   Hypertension     History of Present Illness:    Connor [name] is a 66 y.o. male with a hx of aortic dissection who presents for follow-up.  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  He underwent left common and external iliac artery and left renal vein stents on 09/19/2018 in preparation for total aortic arch repair.  Course was complicated by cardiogenic shock (LVEF down to 15%, which has since recovered), AKI requiring CVVH (with subsequent recovery of renal function), extensive left upper extremity DVT.  On 10/03/2018, he underwent total arch replacement, frozen elephant trunk, and head vessel reconstruction with Dr.Takayama and Dr. Allena Katz.  Course was complicated by sternal wound infection, with cultures growing Klebsiella.  He completed a 14-day course of cefadroxil.  He was following closely with cardiology and nephrology in [place] for blood pressure control.  Moved to [place] in July to help with a family issue.   He was in the ED on 06/19/2019 with chest tightness and shortness of breath.  Troponins negative x2.  CTA chest showed patent graft of the ascending aortic component, patent graft of the proximal descending aorta with, thrombosis of the proximal descending thoracic aortic dissection false lumen along the stent graft, patent type B aortic dissection of the mid descending thoracic aorta extending to the abdominal aortic bifurcation.  Reports that he feels chest pain has been related to muscle cramps from low potassium.  Now taking potassium supplements.  States that he also has chest pain across the incision in  the right upper chest when he does stretches.  TTE on 10/02/2019 showed EF 60-65%, severe LVH, normal RV function, no significant valvular disease.  At clinic visit on 01/05/2020, he was found to be in atrial fibrillation.  Underwent successful DCCV on 02/11/2020.   He was admitted to [facility] from 1/4 through 09/19/2020 with necrotizing fasciitis of the left leg and strep bacteremia.  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  Echocardiogram 09/01/2020 showed EF 60 to 65%, severe LVH, grade 2 diastolic dysfunction, normal RV function, severe left atrial dilatation, moderate right atrial dilatation, no significant valvular disease.  TEE on 09/05/2020 showed no vegetations, positive bubble study.  He was admitted 11/2020 with uncontrolled hypertension and acute on chronic diastolic heart failure, improved with IV Lasix.  He was admitted in June 2022 to [facility] with type B aortic dissection and hypertensive crisis.  On 02/06/2021 he underwent TEVAR extension and left renal artery stenting.  Since his last clinic visit, he reports that he has been doing okay.  Reports occasional chest pain when he presses on incision site from prior surgery for aortic dissection.  Otherwise denies any chest pain.  Reports occasional dyspnea on exertion.  Reports lightheadedness but denies any syncope.  Reports has been having palpitations where he feels like heart is racing, occurs about twice per week and can last for up to 1 minute.  He reports no recent bleeding on Eliquis.  Reports he bruises easily.   Wt Readings from Last 3 Encounters:  02/28/22 243 lb (110.2 kg)  11/07/21 251 lb  6.4 oz (114 kg)  07/27/21 238 lb 12.8 oz (108.3 kg)     Past Medical History:  Diagnosis Date   AAA (abdominal aortic aneurysm) ([place])    History of open heart surgery    Hypertension    Seroma due to trauma [facility])     Past Surgical History:  Procedure Laterality Date   ABDOMINAL AORTIC ANEURYSM REPAIR     AMPUTATION Left 09/07/2020   Procedure:  ATTEMPTED LEFT LEG DEBRIDEMENT FASCIOTOMIES, APPLY INSTILLATION WOUND VAC, ABOVE KNEE AMPUTATION;  Surgeon: Newt Minion, MD;  Location: [place];  Service: Orthopedics;  Laterality: Left;   BUBBLE STUDY  09/05/2020   Procedure: BUBBLE STUDY;  Surgeon: Freada Bergeron, MD;  Location: [place];  Service: Cardiovascular;;   CARDIOVERSION N/A 02/11/2020   Procedure: CARDIOVERSION;  Surgeon: Donato Heinz, MD;  Location: [place];  Service: Cardiovascular;  Laterality: N/A;   CORONARY ARTERY BYPASS GRAFT     STUMP REVISION Left 09/09/2020   Procedure: REVISION LEFT ABOVE KNEE AMPUTATION;  Surgeon: Newt Minion, MD;  Location: [place];  Service: Orthopedics;  Laterality: Left;   TEE WITHOUT CARDIOVERSION N/A 09/05/2020   Procedure: TRANSESOPHAGEAL ECHOCARDIOGRAM (TEE);  Surgeon: Freada Bergeron, MD;  Location: [facility] ENDOSCOPY;  Service: Cardiovascular;  Laterality: N/A;    Current Medications: Current Meds  Medication Sig   acetaminophen (TYLENOL) 325 MG tablet Take 2 tablets (650 mg total) by mouth every 6 (six) hours as needed for moderate pain.   aspirin EC 81 MG tablet Take 81 mg by mouth daily. Swallow whole.   furosemide (LASIX) 40 MG tablet Take 1 tablet (40 mg total) by mouth daily. If weight increases 3 lbs overnight or 5 lbs in 1 week, take additional 40 mg   hydrALAZINE (APRESOLINE) 50 MG tablet Take 1 tablet by mouth in the morning, at noon, and at bedtime.   isosorbide mononitrate (IMDUR) 30 MG 24 hr tablet Take 1 tablet (30 mg total) by mouth daily.   Multiple Vitamins-Minerals (MULTIVITAMIN WITH MINERALS) tablet Take 1 tablet by mouth daily. Men   potassium chloride SA (KLOR-CON) 20 MEQ tablet Take 40 mEq by mouth daily.   spironolactone (ALDACTONE) 50 MG tablet Take 1 tablet (50 mg total) by mouth daily.   traZODone (DESYREL) 100 MG tablet Take 100 mg by mouth at bedtime.     Allergies:   Patient has no known allergies.   Social History   Socioeconomic  History   Marital status: Single    Spouse name: Not on file   Number of children: Not on file   Years of education: Not on file   Highest education level: Not on file  Occupational History   Not on file  Tobacco Use   Smoking status: Every Day    Packs/day: 0.50    Types: Cigarettes   Smokeless tobacco: Never  Vaping Use   Vaping Use: Never used  Substance and Sexual Activity   Alcohol use: Never   Drug use: Never   Sexual activity: Not on file  Other Topics Concern   Not on file  Social History Narrative   Not on file   Social Determinants of Health   Financial Resource Strain: Not on file  Food Insecurity: Not on file  Transportation Needs: Not on file  Physical Activity: Not on file  Stress: Not on file  Social Connections: Not on file     Family History: Mother died of MI at age 28.  Father had stroke  at age 80  ROS:   Please see the history of present illness.     All other systems reviewed and are negative.  EKGs/Labs/Other Studies Reviewed:    The following studies were reviewed today:   EKG:   11/07/21: Sinus rhythm, rate 99, first-degree AV block, left axis deviation, QTc 492, no ST abnormalities 07/27/2021: Sinus rhythm, rate 65, first-degree AV block chronic, left axis deviation, no ST abnormalities  TTE 10/02/19:  1. Left ventricular ejection fraction, by visual estimation, is 60 to  65%. The left ventricle has normal function. Left ventricular septal wall  thickness was severely increased. Severely increased left ventricular  posterior wall thickness. There is  severely increased left ventricular hypertrophy.   2. Left ventricular diastolic function could not be evaluated.   3. The left ventricle has no regional wall motion abnormalities.   4. Global right ventricle has normal systolic function.The right  ventricular size is normal. No increase in right ventricular wall  thickness.   5. Left atrial size was moderately dilated.   6. Right atrial  size was normal.   7. The mitral valve is normal in structure. Trivial mitral valve  regurgitation. No evidence of mitral stenosis.   8. The tricuspid valve is normal in structure.   9. The tricuspid valve is normal in structure. Tricuspid valve  regurgitation is not demonstrated.  10. The aortic valve is tricuspid. Aortic valve regurgitation is not  visualized. No evidence of aortic valve sclerosis or stenosis.  11. The pulmonic valve was normal in structure. Pulmonic valve  regurgitation is not visualized.  12. The inferior vena cava is normal in size with greater than 50%  respiratory variability, suggesting right atrial pressure of 3 mmHg.   Recent Labs: 07/27/2021: Hemoglobin 9.7; Platelets 152 11/07/2021: ALT 17; BNP 79.6; Magnesium 2.0 11/15/2021: BUN 16; Creatinine, Ser 1.34; Potassium 4.3; Sodium 142  Recent Lipid Panel    Component Value Date/Time   CHOL 148 07/27/2021 1030   TRIG 50 07/27/2021 1030   HDL 46 07/27/2021 1030   CHOLHDL 3.2 07/27/2021 1030   CHOLHDL NOT CALCULATED 09/04/2020 1446   VLDL 35 09/04/2020 1446   LDLCALC 91 07/27/2021 1030    Physical Exam:    VS:  BP 136/70   Pulse 82   Ht 5\' 11"  (1.803 m)   Wt 243 lb (110.2 kg)   SpO2 97%   BMI 33.89 kg/m     Wt Readings from Last 3 Encounters:  02/28/22 243 lb (110.2 kg)  11/07/21 251 lb 6.4 oz (114 kg)  07/27/21 238 lb 12.8 oz (108.3 kg)     GEN: Well nourished, well developed in no acute distress HEENT: Normal NECK: No JVD CARDIAC: RRR, 2/6 systolic murmur RESPIRATORY:  Clear to auscultation without rales, wheezing or rhonchi  ABDOMEN: Soft, non-tender, non-distended MUSCULOSKELETAL: Trace edema SKIN: Warm and dry NEUROLOGIC:  Alert and oriented x 3 PSYCHIATRIC:  Normal affect   ASSESSMENT:    1. Palpitations   2. Medication management   3. Resistant hypertension   4. Persistent atrial fibrillation ([place])   5. Dissection of aorta, unspecified portion of aorta ([place])   6. 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  He was admitted in June 2022 to [facility] with type B aortic dissection and hypertensive crisis.  On 02/06/2021 he underwent TEVAR extension and left renal artery stenting. -Follows with [facility] vascular surgery -Goal SBP less than 120, heart rate less than 60.     Persistent atrial fibrillation: At clinic visit on 01/05/2020, he was found to be in atrial fibrillation.  Underwent successful DCCV on 02/11/2020, but subsequently back in AF.  CHADS-VASc score 2 given HTN, vascular disease. TTE shows normal LVEF.  Currently in sinus rhythm. -Continue Eliquis 5 mg twice daily -Continue diltiazem and labetalol  Palpitations: Description concerning for arrhythmia, evaluate with Zio patch x7 days   Resistant hypertension: Currently on spironolactone 50 mg daily, hydralazine 50 mg 3 times daily, diltiazem 240 mg daily, Imdur 30 mg daily, Lasix 40 mg daily, labetalol 400 mg twice daily. -Given resistant hypertension and also issues with persistent hypokalemia, concern for hyperaldosteronism.  Renin/aldosterone ratio >60, have referred to endocrinology and started on spironolactone.  He was seen by endocrinology during admission at [facility] in June 2022, agreed likely had hyperaldosteronism and recommended titrating up spironolactone -Suspect untreated OSA also contributing to resistant hypertension, he had a positive sleep study in September but was lost to follow-up.  He is now being started on CPAP -BP appears controlled, continue current meds.  Will check CMET, magnesium   OSA: positive sleep study 11/2021, awaiting CPAP   RTC in 3  months  Medication Adjustments/Labs and Tests Ordered: Current medicines are reviewed at length with the patient today.  Concerns regarding medicines are outlined above.  Orders Placed This Encounter  Procedures   Comprehensive Metabolic Panel (CMET)   Magnesium   CBC with Differential/Platelet   LONG TERM MONITOR (3-14 DAYS)    No orders of the defined types were placed in this encounter.    Patient Instructions  Medication Instructions:  Your physician recommends that you continue on your current medications as directed. Please refer to the Current Medication list given to you today.  *If you need a refill on your cardiac medications before your next appointment, please call your pharmacy*   Lab Work: Your physician recommends that you return for lab work in:  TODAY: CMET, CBC, Mag If you have labs (blood work) drawn today and your tests are completely normal, you will receive your results only by: [place] (if you have [place]) OR A paper copy in the mail If you have any lab test that is abnormal or we need to change your treatment, we will call you to review the results.   Testing/Procedures: [place]n [place]- Long Term Monitor Instructions  Your physician has requested you wear a ZIO patch monitor for 7 days.  This is a single patch monitor. [employer] supplies one patch monitor per enrollment. Additional stickers are not available. Please do not apply patch if you will be having a Nuclear Stress Test,  Echocardiogram, Cardiac CT, MRI, or Chest Xray during the period you would be wearing the  monitor. The patch cannot be worn during these tests. You cannot remove and re-apply the  ZIO XT patch monitor.  Your ZIO patch monitor will be mailed 3 day USPS to your address on file. It may take 3-5 days  to receive your monitor after you have been enrolled.  Once you have received your monitor, please review the enclosed instructions. Your monitor  has already been registered  assigning a specific monitor serial # to  you.  Billing and Patient Assistance Program Information  We have supplied [employer] with any of your insurance information on file for billing purposes. [employer] offers a sliding scale Patient Assistance Program for patients that do not have  insurance, or whose insurance does not completely cover the cost of the ZIO monitor.  You must apply for the Patient Assistance Program to qualify for this discounted rate.  To apply, please call [employer] at [phone], select option 4, select option 2, ask to apply for  Patient Assistance Program. Theodore Demark will ask your household income, and how many people  are in your household. They will quote your out-of-pocket cost based on that information.  [employer] will also be able to set up a 1-month, interest-free payment plan if needed.  Applying the monitor   Shave hair from upper left chest.  Hold abrader disc by orange tab. Rub abrader in 40 strokes over the upper left chest as  indicated in your monitor instructions.  Clean area with 4 enclosed alcohol pads. Let dry.  Apply patch as indicated in monitor instructions. Patch will be placed under collarbone on left  side of chest with arrow pointing upward.  Rub patch adhesive wings for 2 minutes. Remove white label marked "1". Remove the white  label marked "2". Rub patch adhesive wings for 2 additional minutes.  While looking in a mirror, press and release button in center of patch. A small green light will  flash 3-4 times. This will be your only indicator that the monitor has been turned on.  Do not shower for the first 24 hours. You may shower after the first 24 hours.  Press the button if you feel a symptom. You will hear a small click. Record Date, Time and  Symptom in the Patient Logbook.  When you are ready to remove the patch, follow instructions on the last 2 pages of Patient  Logbook. Stick patch monitor onto the last page of Patient Logbook.  Place  Patient Logbook in the blue and white box. Use locking tab on box and tape box closed  securely. The blue and white box has prepaid postage on it. Please place it in the mailbox as  soon as possible. Your physician should have your test results approximately 7 days after the  monitor has been mailed back to [facility].  Call [place] at [phone] if you have questions regarding  your ZIO XT patch monitor. Call them immediately if you see an orange light blinking on your  monitor.  If your monitor falls off in less than 4 days, contact our Monitor department at [phone].  If your monitor becomes loose or falls off after 4 days call [employer] at [phone] for  suggestions on securing your monitor    Follow-Up: At [facility], you and your health needs are our priority.  As part of our continuing mission to provide you with exceptional heart care, we have created designated Provider Care Teams.  These Care Teams include your primary Cardiologist (physician) and Advanced Practice Providers (APPs -  Physician Assistants and Nurse Practitioners) who all work together to provide you with the care you need, when you need it.  We recommend signing up for the patient portal called "MyChart".  Sign up information is provided on this After Visit Summary.  MyChart is used to connect with patients for Virtual Visits (Telemedicine).  Patients are able to view lab/test results, encounter notes, upcoming appointments, etc.  Non-urgent messages can be sent to  your provider as well.   To learn more about what you can do with MyChart, go to [email or web address].    The format for your next appointment:   In Person  Provider:   3 month with APP, 6 months Dr. Bjorn Pippin  Important Information About Sugar         Signed, Little Ishikawa, MD  02/28/2022 2:05 PM    [facility]

## 2022-02-28 ENCOUNTER — Encounter: Payer: Self-pay | Admitting: Cardiology

## 2022-02-28 ENCOUNTER — Ambulatory Visit (INDEPENDENT_AMBULATORY_CARE_PROVIDER_SITE_OTHER): Payer: 59 | Admitting: Cardiology

## 2022-02-28 ENCOUNTER — Ambulatory Visit (INDEPENDENT_AMBULATORY_CARE_PROVIDER_SITE_OTHER): Payer: 59

## 2022-02-28 VITALS — BP 136/70 | HR 82 | Ht 71.0 in | Wt 243.0 lb

## 2022-02-28 DIAGNOSIS — G4733 Obstructive sleep apnea (adult) (pediatric): Secondary | ICD-10-CM | POA: Diagnosis not present

## 2022-02-28 DIAGNOSIS — I10 Essential (primary) hypertension: Secondary | ICD-10-CM | POA: Diagnosis not present

## 2022-02-28 DIAGNOSIS — R002 Palpitations: Secondary | ICD-10-CM | POA: Diagnosis not present

## 2022-02-28 DIAGNOSIS — Z79899 Other long term (current) drug therapy: Secondary | ICD-10-CM | POA: Diagnosis not present

## 2022-02-28 DIAGNOSIS — I4819 Other persistent atrial fibrillation: Secondary | ICD-10-CM | POA: Diagnosis not present

## 2022-02-28 DIAGNOSIS — I7100 Dissection of unspecified site of aorta: Secondary | ICD-10-CM | POA: Diagnosis not present

## 2022-02-28 NOTE — Patient Instructions (Signed)
Medication Instructions:  Your physician recommends that you continue on your current medications as directed. Please refer to the Current Medication list given to you today.  *If you need a refill on your cardiac medications before your next appointment, please call your pharmacy*   Lab Work: Your physician recommends that you return for lab work in:  TODAY: CMET, CBC, Mag If you have labs (blood work) drawn today and your tests are completely normal, you will receive your results only by: MyChart Message (if you have MyChart) OR A paper copy in the mail If you have any lab test that is abnormal or we need to change your treatment, we will call you to review the results.   Testing/Procedures: Connor [name]- Long Term Monitor Instructions  Your physician has requested you wear a ZIO patch monitor for 7 days.  This is a single patch monitor. [employer] supplies one patch monitor per enrollment. Additional stickers are not available. Please do not apply patch if you will be having a Nuclear Stress Test,  Echocardiogram, Cardiac CT, MRI, or Chest Xray during the period you would be wearing the  monitor. The patch cannot be worn during these tests. You cannot remove and re-apply the  ZIO XT patch monitor.  Your ZIO patch monitor will be mailed 3 day USPS to your address on file. It may take 3-5 days  to receive your monitor after you have been enrolled.  Once you have received your monitor, please review the enclosed instructions. Your monitor  has already been registered assigning a specific monitor serial # to you.  Billing and Patient Assistance Program Information  We have supplied [employer] with any of your insurance information on file for billing purposes. [employer] offers a sliding scale Patient Assistance Program for patients that do not have  insurance, or whose insurance does not completely cover the cost of the ZIO monitor.  You must apply for the Patient Assistance Program to qualify for  this discounted rate.  To apply, please call [employer] at [phone], select option 4, select option 2, ask to apply for  Patient Assistance Program. [name] will ask your household income, and how many people  are in your household. They will quote your out-of-pocket cost based on that information.  [employer] will also be able to set up a 42-month, interest-free payment plan if needed.  Applying the monitor   Shave hair from upper left chest.  Hold abrader disc by orange tab. Rub abrader in 40 strokes over the upper left chest as  indicated in your monitor instructions.  Clean area with 4 enclosed alcohol pads. Let dry.  Apply patch as indicated in monitor instructions. Patch will be placed under collarbone on left  side of chest with arrow pointing upward.  Rub patch adhesive wings for 2 minutes. Remove white label marked "1". Remove the white  label marked "2". Rub patch adhesive wings for 2 additional minutes.  While looking in a mirror, press and release button in center of patch. A small green light will  flash 3-4 times. This will be your only indicator that the monitor has been turned on.  Do not shower for the first 24 hours. You may shower after the first 24 hours.  Press the button if you feel a symptom. You will hear a small click. Record Date, Time and  Symptom in the Patient Logbook.  When you are ready to remove the patch, follow instructions on the last 2 pages of Patient  Logbook. Stick  patch monitor onto the last page of Patient Logbook.  Place Patient Logbook in the blue and white box. Use locking tab on box and tape box closed  securely. The blue and white box has prepaid postage on it. Please place it in the mailbox as  soon as possible. Your physician should have your test results approximately 7 days after the  monitor has been mailed back to [facility].  Call [facility] Customer Care at [phone] if you have questions regarding  your ZIO XT patch monitor.  Call them immediately if you see an orange light blinking on your  monitor.  If your monitor falls off in less than 4 days, contact our Monitor department at [phone].  If your monitor becomes loose or falls off after 4 days call [employer] at [phone] for  suggestions on securing your monitor    Follow-Up: At [facility], you and your health needs are our priority.  As part of our continuing mission to provide you with exceptional heart care, we have created designated Provider Care Teams.  These Care Teams include your primary Cardiologist (physician) and Advanced Practice Providers (APPs -  Physician Assistants and Nurse Practitioners) who all work together to provide you with the care you need, when you need it.  We recommend signing up for the patient portal called "MyChart".  Sign up information is provided on this After Visit Summary.  MyChart is used to connect with patients for Virtual Visits (Telemedicine).  Patients are able to view lab/test results, encounter notes, upcoming appointments, etc.  Non-urgent messages can be sent to your provider as well.   To learn more about what you can do with MyChart, go to [email or web address].    The format for your next appointment:   In Person  Provider:   3 month with APP, 6 months Dr. Bjorn Pippin  Important Information About Sugar

## 2022-02-28 NOTE — Progress Notes (Unsigned)
Enrolled patient for a 7 day Zio XT monitor to be mailed to patients home.  

## 2022-03-01 LAB — COMPREHENSIVE METABOLIC PANEL
ALT: 16 IU/L (ref 0–44)
AST: 23 IU/L (ref 0–40)
Albumin/Globulin Ratio: 1.6 (ref 1.2–2.2)
Albumin: 4.5 g/dL (ref 3.8–4.8)
Alkaline Phosphatase: 80 IU/L (ref 44–121)
BUN/Creatinine Ratio: 12 (ref 10–24)
BUN: 17 mg/dL (ref 8–27)
Bilirubin Total: 0.7 mg/dL (ref 0.0–1.2)
CO2: 28 mmol/L (ref 20–29)
Calcium: 9.1 mg/dL (ref 8.6–10.2)
Chloride: 103 mmol/L (ref 96–106)
Creatinine, Ser: 1.45 mg/dL — ABNORMAL HIGH (ref 0.76–1.27)
Globulin, Total: 2.9 g/dL (ref 1.5–4.5)
Glucose: 93 mg/dL (ref 70–99)
Potassium: 4.6 mmol/L (ref 3.5–5.2)
Sodium: 144 mmol/L (ref 134–144)
Total Protein: 7.4 g/dL (ref 6.0–8.5)
eGFR: 54 mL/min/{1.73_m2} — ABNORMAL LOW (ref >59)

## 2022-03-01 LAB — CBC WITH DIFFERENTIAL/PLATELET
Basophils Absolute: 0 10*3/uL (ref 0.0–0.2)
Basos: 1 %
EOS (ABSOLUTE): 0.2 10*3/uL (ref 0.0–0.4)
Eos: 5 %
Hematocrit: 35.9 % — ABNORMAL LOW (ref 37.5–51.0)
Hemoglobin: 11.5 g/dL — ABNORMAL LOW (ref 13.0–17.7)
Immature Grans (Abs): 0 10*3/uL (ref 0.0–0.1)
Immature Granulocytes: 0 %
Lymphocytes Absolute: 1.2 10*3/uL (ref 0.7–3.1)
Lymphs: 27 %
MCH: 30.5 pg (ref 26.6–33.0)
MCHC: 32 g/dL (ref 31.5–35.7)
MCV: 95 fL (ref 79–97)
Monocytes Absolute: 0.5 10*3/uL (ref 0.1–0.9)
Monocytes: 11 %
Neutrophils Absolute: 2.6 10*3/uL (ref 1.4–7.0)
Neutrophils: 56 %
Platelets: 139 10*3/uL — ABNORMAL LOW (ref 150–450)
RBC: 3.77 x10E6/uL — ABNORMAL LOW (ref 4.14–5.80)
RDW: 12.2 % (ref 11.6–15.4)
WBC: 4.6 10*3/uL (ref 3.4–10.8)

## 2022-03-01 LAB — MAGNESIUM: Magnesium: 2.1 mg/dL (ref 1.6–2.3)

## 2022-03-05 DIAGNOSIS — R002 Palpitations: Secondary | ICD-10-CM

## 2022-03-28 ENCOUNTER — Telehealth: Payer: Self-pay | Admitting: Cardiology

## 2022-03-28 NOTE — Telephone Encounter (Signed)
Christina from [facility] calling with critical zio monitor results.

## 2022-03-28 NOTE — Telephone Encounter (Signed)
Received call from [place] at [facility]. Patient had "100% burden of aflutter of 182 beats sustained for 60 seconds on 7/10 at 2:49 PM." Patient not answering phone (lmtcb); brother's number not working. Showed rhythm to Dr. Tresa Endo (DOD). Rhythm was afib.patient on ASA and apixaban.

## 2022-04-19 ENCOUNTER — Telehealth: Payer: Self-pay | Admitting: Cardiology

## 2022-04-19 NOTE — Telephone Encounter (Signed)
Left message to call back  

## 2022-04-19 NOTE — Telephone Encounter (Signed)
Patient returned call for his monitor results.  

## 2022-04-25 ENCOUNTER — Encounter: Payer: Self-pay | Admitting: *Deleted

## 2022-04-25 ENCOUNTER — Telehealth: Payer: Self-pay | Admitting: Cardiology

## 2022-04-25 NOTE — Telephone Encounter (Signed)
Patient returned RN's call regarding results. 

## 2022-04-26 NOTE — Telephone Encounter (Signed)
Attempted to call patient, left message for patient to call back to office.   

## 2022-04-26 NOTE — Telephone Encounter (Signed)
Left message to call back  

## 2022-04-26 NOTE — Telephone Encounter (Signed)
Pt is returning call.  

## 2022-05-02 DIAGNOSIS — E559 Vitamin D deficiency, unspecified: Secondary | ICD-10-CM | POA: Diagnosis not present

## 2022-05-02 DIAGNOSIS — I10 Essential (primary) hypertension: Secondary | ICD-10-CM | POA: Diagnosis not present

## 2022-05-02 DIAGNOSIS — E785 Hyperlipidemia, unspecified: Secondary | ICD-10-CM | POA: Diagnosis not present

## 2022-05-27 NOTE — Progress Notes (Unsigned)
Cardiology Clinic Note   Patient Name: Connor [name] Date of Encounter: 05/29/2022  Primary Care Provider:  Ludwig Clarks, FNP Primary Cardiologist:  Donato Heinz, MD  Patient Profile    Connor [name] 66 year old male presents to the clinic today for follow-up evaluation of his atrial fibrillation and essential hypertension.  Past Medical History    Past Medical History:  Diagnosis Date   AAA (abdominal aortic aneurysm) ([place])    History of open heart surgery    Hypertension    Seroma due to trauma [facility])    Past Surgical History:  Procedure Laterality Date   ABDOMINAL AORTIC ANEURYSM REPAIR     AMPUTATION Left 09/07/2020   Procedure: ATTEMPTED LEFT LEG DEBRIDEMENT FASCIOTOMIES, APPLY INSTILLATION WOUND VAC, ABOVE KNEE AMPUTATION;  Surgeon: Newt Minion, MD;  Location: [place];  Service: Orthopedics;  Laterality: Left;   BUBBLE STUDY  09/05/2020   Procedure: BUBBLE STUDY;  Surgeon: Freada Bergeron, MD;  Location: [place];  Service: Cardiovascular;;   CARDIOVERSION N/A 02/11/2020   Procedure: CARDIOVERSION;  Surgeon: Donato Heinz, MD;  Location: [place];  Service: Cardiovascular;  Laterality: N/A;   CORONARY ARTERY BYPASS GRAFT     STUMP REVISION Left 09/09/2020   Procedure: REVISION LEFT ABOVE KNEE AMPUTATION;  Surgeon: Newt Minion, MD;  Location: [place];  Service: Orthopedics;  Laterality: Left;   TEE WITHOUT CARDIOVERSION N/A 09/05/2020   Procedure: TRANSESOPHAGEAL ECHOCARDIOGRAM (TEE);  Surgeon: Freada Bergeron, MD;  Location: [facility] ENDOSCOPY;  Service: Cardiovascular;  Laterality: N/A;    Allergies  No Known Allergies  History of Present Illness    [name] has a PMH of atrial fibrillation, HTN, acute on chronic diastolic CHF, CKD stage III, morbid obesity, cellulitis left leg, transaminitis, severe anemia, and left above-the-knee amputation.  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  He underwent left AKA.  His echocardiogram 09/01/2020 showed an LVEF of 60-65%, severe LVH, G2 DD, normal RV function and no significant valvular abnormalities.  His TEE on 1022 showed no vegetation.  He was admitted 4/22 with uncontrolled hypertension and acute on chronic diastolic CHF.  His symptoms improved with IV diuresis.  He was admitted 6/22 to [facility] with type III aortic dissection and hypertensive crisis.  On 02/06/2021 he underwent TEVAR extension and left renal artery stenting.  He followed up with Dr. Gardiner Rhyme on 02/28/2022.  During that time he reported that he was doing okay.  He reported occasional episodes of chest discomfort at the incision site from his aortic dissection.  He otherwise denied chest discomfort.  He reported occasional dyspnea on exertion.  He did note lightheadedness but denied syncope.  He also noted palpitations and indicated he felt like his heart was racing.  Episodes would occur twice per week and last for about a minute.  He reported compliance with Eliquis and denied bleeding issues.  He noted easy bruising.  A 7-day cardiac event monitor was ordered.  It noted 100% atrial fibrillation burden with an average heart rate of 85 bpm.  No clear flutter waves were noted.  He was also found to have 2 pauses with the longest lasting for seconds.  It was recommended that he present to the clinic for twelve-lead EKG and it was felt that his pauses were likely due to sleep apnea.  He was encouraged to start CPAP.  He presents to the clinic today for  follow-up evaluation and states he continues to feel well and is fairly physically active at home he uses his walker and his prosthesis while at home and with the area he is working and is flat.  He denies chest discomfort with increased physical activity.  He is maintaining his property and enjoys doing yard work.  We reviewed his cardiac event monitor and he expressed understanding.  He reports  that he has not yet received his CPAP.  He talked about the benefit of preventing sleep apnea and told him that I would reach out to see what had happened with his CPAP device.  He reports only occasional episodes of palpitations when he is in certain positions.  His EKG today shows atrial fibrillation with left axis deviation 77 bpm.  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  We will plan follow-up in 6 months.  Today he denies chest pain, shortness of breath, lower extremity edema, fatigue, palpitations, melena, hematuria, hemoptysis, diaphoresis, weakness, presyncope, syncope, orthopnea, and PND.    Home Medications    Prior to Admission medications   Medication Sig Start Date End Date Taking? Authorizing Provider  acetaminophen (TYLENOL) 325 MG tablet Take 2 tablets (650 mg total) by mouth every 6 (six) hours as needed for moderate pain. 10/10/20   Arrien, Jimmy Picket, MD  aspirin EC 81 MG tablet Take 81 mg by mouth daily. Swallow whole.    [provider]  diltiazem (CARDIZEM CD) 240 MG 24 hr capsule TAKE 1 CAPSULE (240 MG TOTAL) BY MOUTH DAILY. 09/29/20 11/07/21  Angiulli, Lavon Paganini, PA-C  ELIQUIS 5 MG TABS tablet TAKE 1 TABLET (5 MG TOTAL) BY MOUTH 2 (TWO) TIMES DAILY. 09/29/20 11/07/21  Angiulli, Lavon Paganini, PA-C  furosemide (LASIX) 40 MG tablet Take 1 tablet (40 mg total) by mouth daily. If weight increases 3 lbs overnight or 5 lbs in 1 week, take additional 40 mg 11/10/21   Donato Heinz, MD  gabapentin (NEURONTIN) 400 MG capsule TAKE 1 CAPSULE (400 MG TOTAL) BY MOUTH THREE TIMES DAILY. 09/29/20 11/07/21  Angiulli, Lavon Paganini, PA-C  hydrALAZINE (APRESOLINE) 50 MG tablet Take 1 tablet by mouth in the morning, at noon, and at bedtime.    [provider]  isosorbide mononitrate (IMDUR) 30 MG 24 hr tablet Take 1 tablet (30 mg total) by mouth daily. 12/10/20   Barton Dubois, MD  labetalol  (NORMODYNE) 200 MG tablet TAKE 2 TABLETS (400 MG TOTAL) BY MOUTH TWO TIMES DAILY. 09/29/20 11/07/21  Angiulli, Lavon Paganini, PA-C  Multiple Vitamins-Minerals (MULTIVITAMIN WITH MINERALS) tablet Take 1 tablet by mouth daily. Men    [provider]  potassium chloride SA (KLOR-CON) 20 MEQ tablet Take 40 mEq by mouth daily. 01/17/21   [provider]  spironolactone (ALDACTONE) 50 MG tablet Take 1 tablet (50 mg total) by mouth daily. 08/15/21   Donato Heinz, MD  traZODone (DESYREL) 100 MG tablet Take 100 mg by mouth at bedtime. 11/28/20   [provider]    Family History    Family History  Problem Relation Age of Onset   CAD Mother    CVA Father    He indicated that the status of his mother is unknown. He indicated that the status of his father is unknown.  Social History    Social History   Socioeconomic History   Marital status: Single    Spouse name: Not on  file   Number of children: Not on file   Years of education: Not on file   Highest education level: Not on file  Occupational History   Not on file  Tobacco Use   Smoking status: Every Day    Packs/day: 0.50    Types: Cigarettes   Smokeless tobacco: Never  Vaping Use   Vaping Use: Never used  Substance and Sexual Activity   Alcohol use: Never   Drug use: Never   Sexual activity: Not on file  Other Topics Concern   Not on file  Social History Narrative   Not on file   Social Determinants of Health   Financial Resource Strain: Not on file  Food Insecurity: Not on file  Transportation Needs: Not on file  Physical Activity: Not on file  Stress: Not on file  Social Connections: Not on file  Intimate Partner Violence: Not on file     Review of Systems    General:  No chills, fever, night sweats or weight changes.  Cardiovascular:  No chest pain, dyspnea on exertion, edema, orthopnea, palpitations, paroxysmal nocturnal dyspnea. Dermatological: No rash, lesions/masses Respiratory: No  cough, dyspnea Urologic: No hematuria, dysuria Abdominal:   No nausea, vomiting, diarrhea, bright red blood per rectum, melena, or hematemesis Neurologic:  No visual changes, wkns, changes in mental status. All other systems reviewed and are otherwise negative except as noted above.  Physical Exam    VS:  BP 98/70   Pulse 77   Ht 5' 11"  (1.803 m)   Wt 248 lb (112.5 kg)   SpO2 93%   BMI 34.59 kg/m  , BMI Body mass index is 34.59 kg/m. GEN: Well nourished, well developed, in no acute distress. HEENT: normal. Neck: Supple, no JVD, carotid bruits, or masses. Cardiac: RRR, no murmurs, rubs, or gallops. No clubbing, cyanosis, edema.  Radials/DP/PT 2+ and equal bilaterally.  Respiratory:  Respirations regular and unlabored, clear to auscultation bilaterally. GI: Soft, nontender, nondistended, BS + x 4. MS: no deformity or atrophy. Skin: warm and dry, no rash. Neuro:  Strength and sensation are intact. Psych: Normal affect.  Accessory Clinical Findings    Recent Labs: 11/07/2021: BNP 79.6 02/28/2022: ALT 16; BUN 17; Creatinine, Ser 1.45; Hemoglobin 11.5; Magnesium 2.1; Platelets 139; Potassium 4.6; Sodium 144   Recent Lipid Panel    Component Value Date/Time   CHOL 148 07/27/2021 1030   TRIG 50 07/27/2021 1030   HDL 46 07/27/2021 1030   CHOLHDL 3.2 07/27/2021 1030   CHOLHDL NOT CALCULATED 09/04/2020 1446   VLDL 35 09/04/2020 1446   LDLCALC 91 07/27/2021 1030         ECG personally reviewed by me today-atrial fibrillation left axis deviation 77 bpm- No acute changes  Cardiac event monitor 03/29/2022   Monitor read as 100% atrial flutter burden, average rate 85 bpm, but I do not see clear flutter waves.  Recommend twelve-lead EKG   2 pauses, longest lasting 4 seconds.  Pauses occurred at 3 AM     Patch Wear Time:  14 days and 0 hours (2023-07-10T14:11:53-0400 to 2023-07-24T14:11:57-0400)   Atrial Flutter occurred continuously (100% burden), ranging from 39-207 bpm (avg of 85  bpm). Some episodes of Atrial Flutter conducted with possible aberrancy. 2 Pauses occurred, the longest lasting 4 secs (15 bpm). Isolated VEs were rare (<1.0%, 13649),  VE Couplets were rare (<1.0%, 41), and VE Triplets were rare (<1.0%, 1). Ventricular Bigeminy and Trigeminy were present. MD notification criteria for Rapid Atrial Flutter met -  report posted prior to notification per account request (LDG).    Echocardiogram 09/05/2020 IMPRESSIONS     1. Left ventricular ejection fraction, by estimation, is 60 to 65%. The  left ventricle has normal function.   2. Right ventricular systolic function is normal. The right ventricular  size is normal. Pulmonary artery systolic pressure is 77CHEK+ RAP.   3. Left atrial size was severely dilated. No left atrial/left atrial  appendage thrombus was detected.   4. The mitral valve is normal in structure. Trivial mitral valve  regurgitation.   5. The aortic valve is tricuspid. Aortic valve regurgitation is trivial.  No aortic stenosis is present.   6. Agitated saline contrast bubble study was positive with shunting  observed after >6 cardiac cycles suggestive of intrapulmonary shunting.   7. Right atrial size was severely dilated.   8. No valvular vegetations visualized.  Assessment & Plan   1.  Palpitations-EKG today shows atrial fibrillation left axis deviation 77 bpm.  Cardiac event monitor showed 1% atrial fibrillation burden and 2 pauses.  Pauses were felt to be during episodes of OSA. Continue Cardizem Avoid triggers caffeine, chocolate, EtOH, dehydration etc.  OSA-reports compliance with CPAP.  Waking up well rested. Continue CPAP use  Atrial fibrillation-underwent successful DCCV 6/21.  He subsequently went back into atrial fibrillation.  CHA2DS2-VASc score 2.  Echocardiogram showed normal LV function.  Heart rate today 77 bpm.  Reports compliance with Eliquis and denies bleeding issues. Continue diltiazem, Limbrel, apixaban Heart  healthy low-sodium diet-salty 6 given Increase physical activity as tolerated  Resistant hypertension-BP today 98/70 Continue hydralazine, spironolactone, Imdur, diltiazem Heart healthy low-sodium diet-salty 6 given Increase physical activity as tolerated  Aortic dissection-underwent repair for type a aortic dissection in 2016.  He had dissection at anastomosis suture lines 12/19.  Underwent left renal vein stenting 1/20 and then total arch replacement 2/20 in [place].  He was admitted 6/22 at [facility] for type B dissection during hypertensive crisis.  On 02/06/2021 he had TEVAR extension and left renal artery stenting. Follows with vascular surgery Reports well-controlled blood pressure  Disposition: Follow-up with Dr. Gardiner Rhyme or me in 6 months.   Jossie Ng. Neya Creegan NP-C     05/29/2022, 3:01 PM [address] Office [phone] Fax [phone]  Notice: This dictation was prepared with Dragon dictation along with smaller phrase technology. Any transcriptional errors that result from this process are unintentional and may not be corrected upon review.  I spent 14 minutes examining this patient, reviewing medications, and using patient centered shared decision making involving her cardiac care.  Prior to her visit I spent greater than 20 minutes reviewing her past medical history,  medications, and prior cardiac tests.

## 2022-05-29 ENCOUNTER — Ambulatory Visit: Payer: Medicaid Other | Attending: General Practice | Admitting: General Practice

## 2022-05-29 ENCOUNTER — Encounter: Payer: Self-pay | Admitting: General Practice

## 2022-05-29 VITALS — BP 98/70 | HR 77 | Ht 71.0 in | Wt 248.0 lb

## 2022-05-29 DIAGNOSIS — I4819 Other persistent atrial fibrillation: Secondary | ICD-10-CM | POA: Diagnosis not present

## 2022-05-29 DIAGNOSIS — R002 Palpitations: Secondary | ICD-10-CM | POA: Diagnosis not present

## 2022-05-29 DIAGNOSIS — I7100 Dissection of unspecified site of aorta: Secondary | ICD-10-CM | POA: Diagnosis not present

## 2022-05-29 DIAGNOSIS — I1A0 Resistant hypertension: Secondary | ICD-10-CM | POA: Diagnosis not present

## 2022-05-29 NOTE — Patient Instructions (Signed)
Medication Instructions:  The current medical regimen is effective;  continue present plan and medications as directed. Please refer to the Current Medication list given to you today.  *If you need a refill on your cardiac medications before your next appointment, please call your pharmacy*   Lab Work: NONE  Other Instructions MAINTAIN PHYSICAL ACTIVITY-AS TOLERATED   Follow-Up: At [facility], you and your health needs are our priority.  As part of our continuing mission to provide you with exceptional heart care, we have created designated Provider Care Teams.  These Care Teams include your primary Cardiologist (physician) and Advanced Practice Providers (APPs -  Physician Assistants and Nurse Practitioners) who all work together to provide you with the care you need, when you need it.  Your next appointment:   6 month(s)  The format for your next appointment:   In Person  Provider:   Donato Heinz, MD  or Coletta Memos, FNP       Important Information About Sugar

## 2022-12-02 IMAGING — MR MR FEMUR*L* W/O CM
7 series · 33 of 40 positions shown · non-contrast
Comparison: CT scan 08/31/2020

CLINICAL DATA: Left lower extremity pain and swelling.

EXAM:
MR OF THE LEFT FEMUR WITHOUT CONTRAST
TECHNIQUE: Multiplanar, multisequence MR imaging of the left lower extremity
was performed. No intravenous contrast was administered.

[Series 6: composed cor t1_comp · coronal · left · 5.6mm · 1.08mm/px · 4 of 35 slices shown]
[im 1/35]
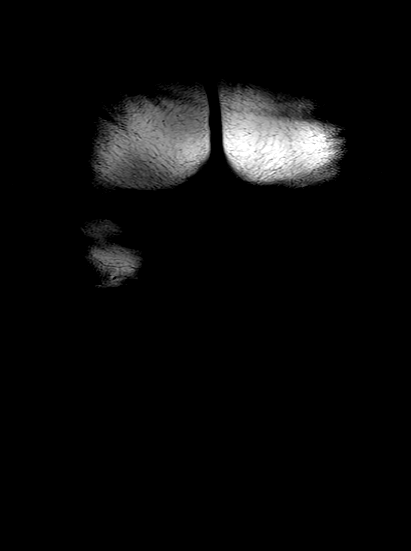
[im 12/35]
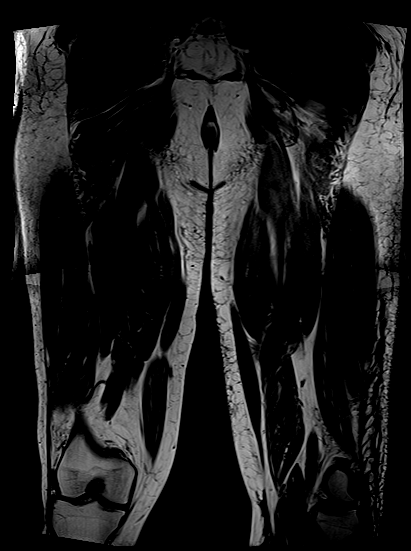
[im 23/35]
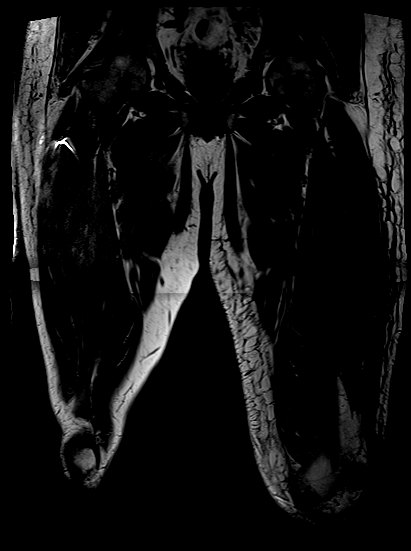
[im 35/35]
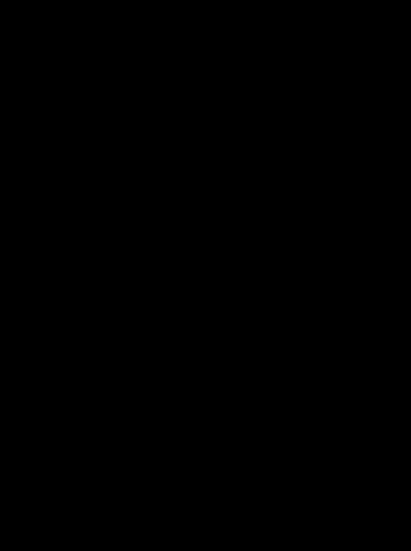

[Series 7: composed cor t1_comp_filt · coronal · left · 5.6mm · 1.08mm/px · 4 of 35 slices shown]
[im 1/35]
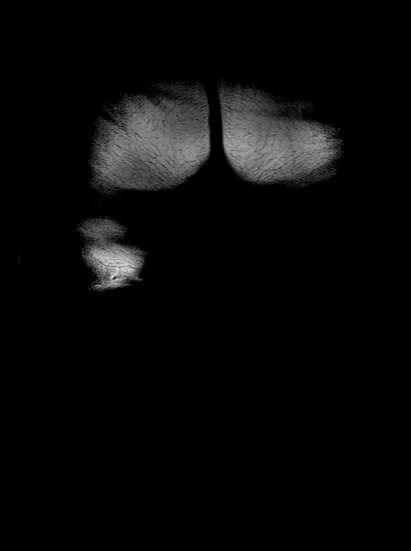
[im 12/35]
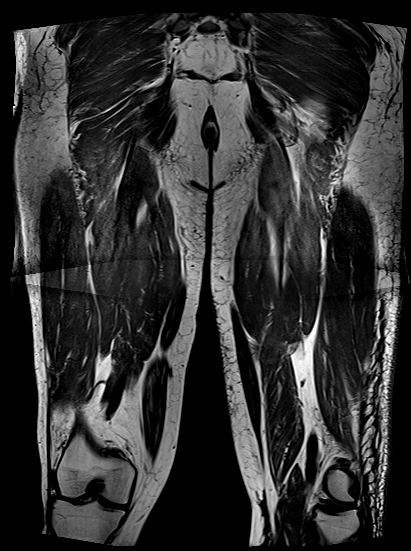
[im 23/35]
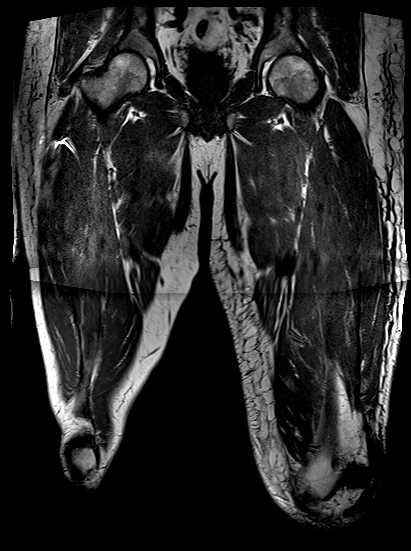
[im 35/35]
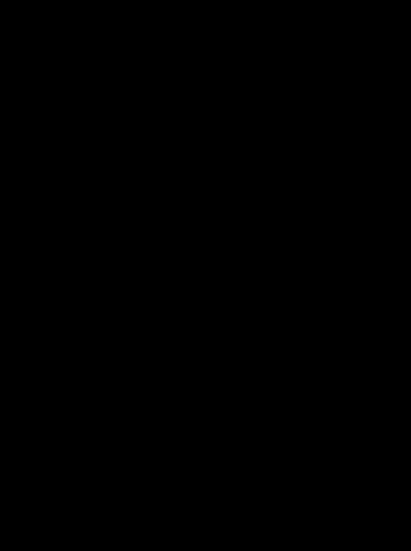

[Series 10: composed cor stir_comp · coronal · left · 5.6mm · 1.08mm/px · 4 of 36 slices shown]
[im 1/36]
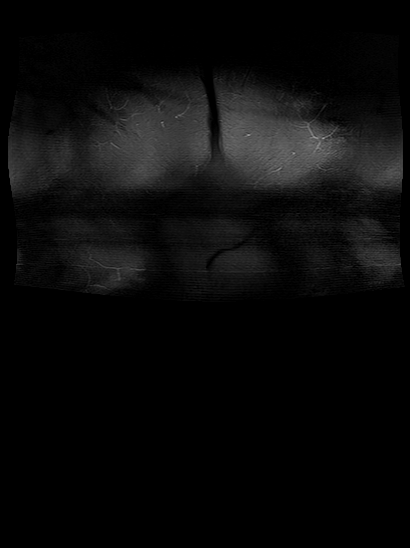
[im 12/36]
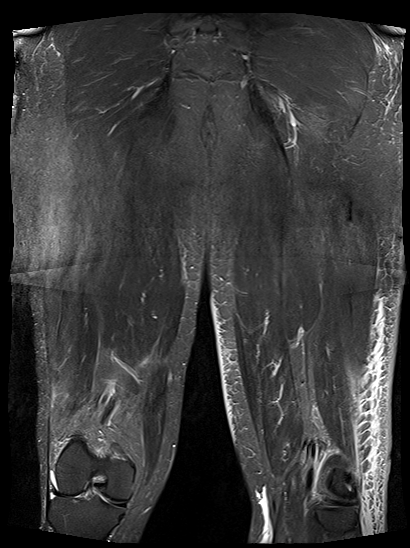
[im 24/36]
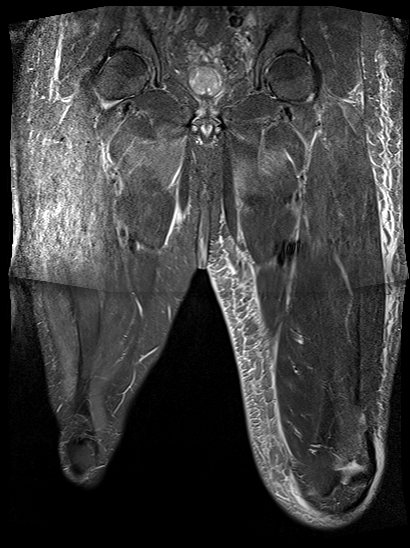
[im 36/36]
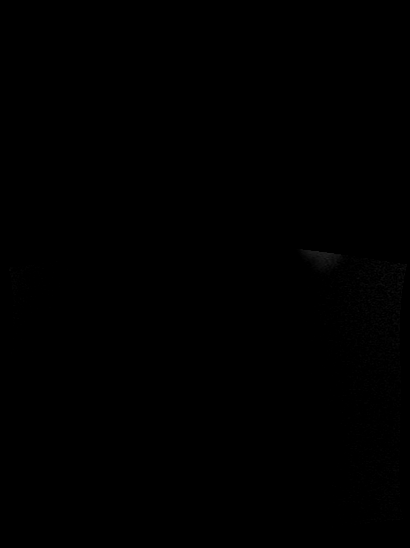

[Series 11: composed cor stir_comp_filt · coronal · left · 5.6mm · 1.08mm/px · 4 of 36 slices shown]
[im 1/36]
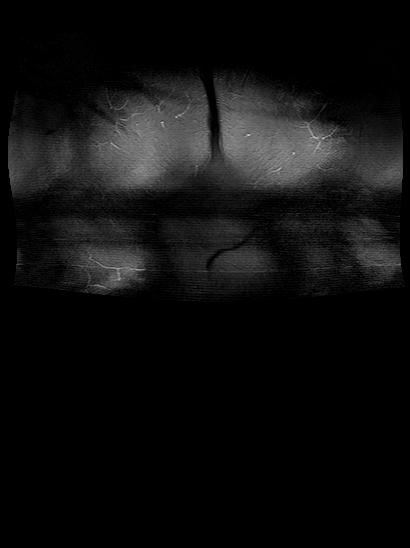
[im 12/36]
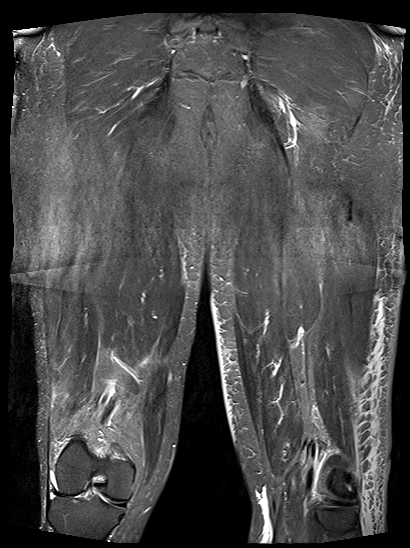
[im 24/36]
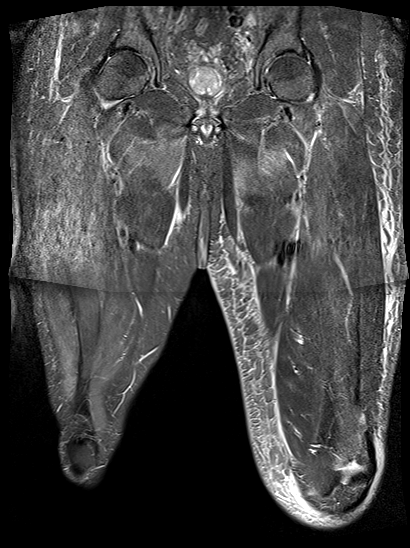
[im 36/36]
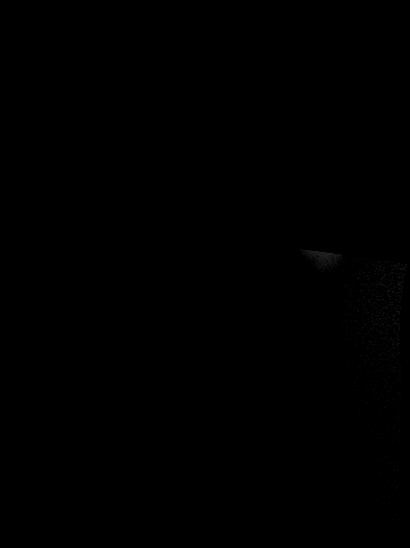

[Series 14: ax t1_comp · axial · left · 5.0mm · 0.86mm/px · z∈[-276,+259]mm · 8 of 90 slices shown]
[im 1/90]
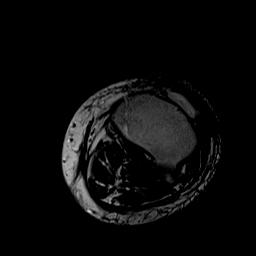
[im 10/90]
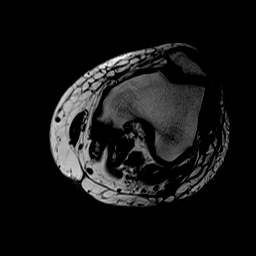
[im 30/90]
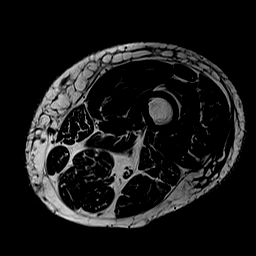
[im 40/90]
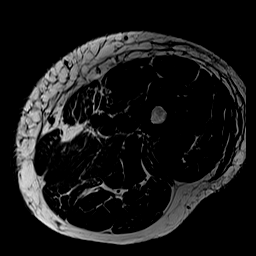
[im 50/90]
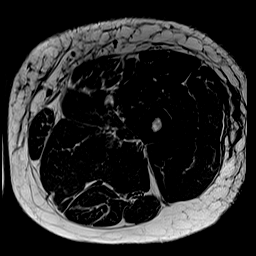
[im 60/90]
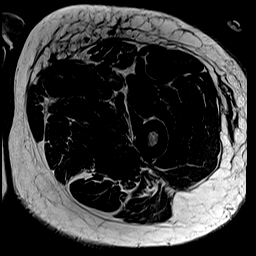
[im 80/90]
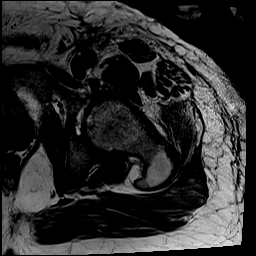
[im 90/90]
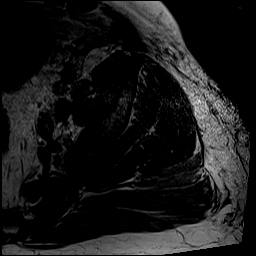

[Series 17: T2 · axial · left · 5.0mm · 0.86mm/px · z∈[-276,+259]mm · 8 of 90 slices shown]
[im 1/90]
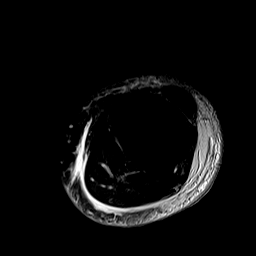
[im 10/90]
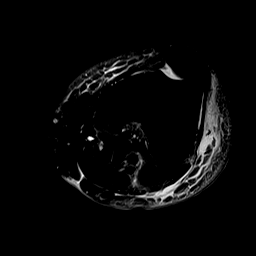
[im 30/90]
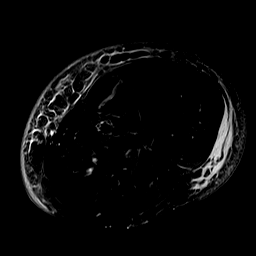
[im 40/90]
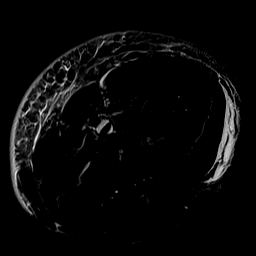
[im 50/90]
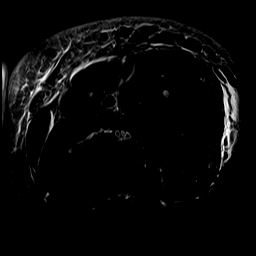
[im 60/90]
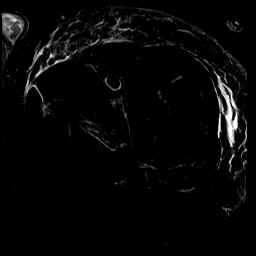
[im 80/90]
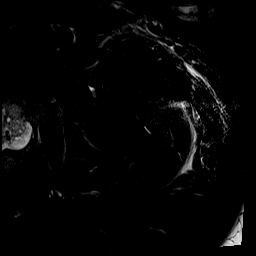
[im 90/90]
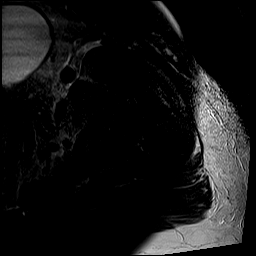

[Series 20: t2_tse_sag fs_comp · sagittal · left · 5.6mm · 0.94mm/px · 1 of 40 slices shown]
[im 1/40]
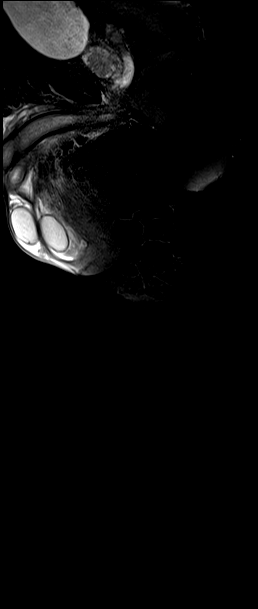

[33 of 40 positions shown; findings below may reference images not displayed]

FINDINGS: Both hips are normally located. Mild degenerative changes but no
findings suspicious for septic arthritis or osteomyelitis. The left
femur is intact. No evidence of osteomyelitis.

Mild diffuse subcutaneous soft tissue swelling/edema/fluid
suggesting cellulitis. Again demonstrated are enlarged and inflamed
appearing left inguinal lymph nodes, likely a lymph adenitis. No
discrete drainable subcutaneous soft tissue abscess is identified
although study somewhat limited without IV contrast.

Changes of myofasciitis mainly involving the medial
compartment/adductor compartment muscles. No findings suspicious for
pyomyositis. Similar findings involving the right upper thigh.

Stable postoperative changes involving the left inguinal area with
scar tissue. The major vascular structures demonstrate patent flow
voids. Stable borderline enlarged left external iliac lymph node
measuring 15.5 mm. No significant intrapelvic abnormalities are
identified.
IMPRESSION: 1. Mild diffuse subcutaneous soft tissue swelling/edema/fluid
suggesting cellulitis.
2. Changes of myofasciitis mainly involving the medial
compartment/adductor compartment muscles. No evidence of
pyomyositis.
3. No findings suspicious for septic arthritis or osteomyelitis.
4. Stable enlarged and inflamed appearing left inguinal and left
external iliac lymph nodes.

## 2023-01-23 ENCOUNTER — Other Ambulatory Visit: Payer: Self-pay

## 2023-01-23 ENCOUNTER — Emergency Department (HOSPITAL_COMMUNITY): Payer: 59

## 2023-01-23 ENCOUNTER — Encounter (HOSPITAL_COMMUNITY): Payer: Self-pay | Admitting: Emergency Medicine

## 2023-01-23 ENCOUNTER — Emergency Department (HOSPITAL_COMMUNITY)
Admission: EM | Admit: 2023-01-23 | Discharge: 2023-01-24 | Disposition: A | Discharge status: Home / Self Care | Payer: 59 | Attending: Emergency Medicine | Admitting: Emergency Medicine

## 2023-01-23 DIAGNOSIS — Z7901 Long term (current) use of anticoagulants: Secondary | ICD-10-CM | POA: Insufficient documentation

## 2023-01-23 DIAGNOSIS — Z7982 Long term (current) use of aspirin: Secondary | ICD-10-CM | POA: Diagnosis not present

## 2023-01-23 DIAGNOSIS — Z951 Presence of aortocoronary bypass graft: Secondary | ICD-10-CM | POA: Insufficient documentation

## 2023-01-23 DIAGNOSIS — I2510 Atherosclerotic heart disease of native coronary artery without angina pectoris: Secondary | ICD-10-CM | POA: Diagnosis not present

## 2023-01-23 DIAGNOSIS — I509 Heart failure, unspecified: Secondary | ICD-10-CM | POA: Insufficient documentation

## 2023-01-23 DIAGNOSIS — R600 Localized edema: Secondary | ICD-10-CM | POA: Insufficient documentation

## 2023-01-23 DIAGNOSIS — R079 Chest pain, unspecified: Secondary | ICD-10-CM | POA: Diagnosis present

## 2023-01-23 DIAGNOSIS — R0789 Other chest pain: Secondary | ICD-10-CM | POA: Diagnosis not present

## 2023-01-23 LAB — BASIC METABOLIC PANEL
Anion gap: 9 (ref 5–15)
BUN: 25 mg/dL — ABNORMAL HIGH (ref 8–23)
CO2: 29 mmol/L (ref 22–32)
Calcium: 8.6 mg/dL — ABNORMAL LOW (ref 8.9–10.3)
Chloride: 101 mmol/L (ref 98–111)
Creatinine, Ser: 1.51 mg/dL — ABNORMAL HIGH (ref 0.61–1.24)
GFR, Estimated: 51 mL/min — ABNORMAL LOW (ref >60)
Glucose, Bld: 100 mg/dL — ABNORMAL HIGH (ref 70–99)
Potassium: 3.9 mmol/L (ref 3.5–5.1)
Sodium: 139 mmol/L (ref 135–145)

## 2023-01-23 LAB — TROPONIN I (HIGH SENSITIVITY)
Troponin I (High Sensitivity): 20 ng/L — ABNORMAL HIGH (ref <18)
Troponin I (High Sensitivity): 20 ng/L — ABNORMAL HIGH (ref <18)

## 2023-01-23 LAB — BRAIN NATRIURETIC PEPTIDE: B Natriuretic Peptide: 78 pg/mL (ref 0.0–100.0)

## 2023-01-23 LAB — CBC
HCT: 35.5 % — ABNORMAL LOW (ref 39.0–52.0)
Hemoglobin: 11.2 g/dL — ABNORMAL LOW (ref 13.0–17.0)
MCH: 31.4 pg (ref 26.0–34.0)
MCHC: 31.5 g/dL (ref 30.0–36.0)
MCV: 99.4 fL (ref 80.0–100.0)
Platelets: 151 10*3/uL (ref 150–400)
RBC: 3.57 MIL/uL — ABNORMAL LOW (ref 4.22–5.81)
RDW: 13.1 % (ref 11.5–15.5)
WBC: 8.2 10*3/uL (ref 4.0–10.5)
nRBC: 0 % (ref 0.0–0.2)

## 2023-01-23 LAB — D-DIMER, QUANTITATIVE: D-Dimer, Quant: 8.71 ug/mL-FEU — ABNORMAL HIGH (ref 0.00–0.50)

## 2023-01-23 MED ORDER — IOHEXOL 350 MG/ML SOLN
75.0000 mL | Freq: Once | INTRAVENOUS | Status: AC | PRN
  Administered 2023-01-23: 75 mL via INTRAVENOUS

## 2023-01-23 NOTE — ED Notes (Signed)
ED Provider at bedside. 

## 2023-01-23 NOTE — ED Triage Notes (Signed)
Pt via POV c/o right-sided chest pain x 2 days near surgical site, radiating across his chest. Describes as a tightening sensation rated 5/10 intermittent. Pt is also having swelling to left arm and hand; 3+ non-pitting edema noted to left hand, with 7/10 pain. Pt has felt nauseated and sweaty intermittently. Hx includes HTN

## 2023-01-23 NOTE — ED Provider Notes (Incomplete)
[place] EMERGENCY DEPARTMENT AT [facility] Provider Note   CSN: [id number] Arrival date & time: 01/23/23  1627     History {Add pertinent medical, surgical, social history, OB history to HPI:1} Chief Complaint  Patient presents with  . Chest Pain    Connor [name] is a 66 y.o. 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 He states his symptoms remind him of symptoms related to prior aorta complications requiring surgery.  Per review of chart he was last seen by his thoracic surgeon August 2023 at which time his CT imaging showed an aneurysm of 5.2 cm distal to his TEVAR, plan at that time was a 1 year follow-up, would discuss repair once this measurement is 6 cm.   He also woke up 2 mornings ago with significant swelling and discomfort in his left arm of unclear etiology.  He denies any injuries, initially felt he may have just "slept wrong on his arm" he has been using ice without any improvement in this symptom.  He denies fevers or chills but has had some episodes of diaphoresis has also had mild nausea without emesis.      The history is provided by the patient.       Home Medications Prior to Admission medications   Medication Sig Start Date End Date Taking? Authorizing Provider  acetaminophen (TYLENOL) 325 MG tablet Take 2 tablets (650 mg total) by mouth every 6 (six) hours as needed for moderate pain. Patient not taking: Reported on 05/29/2022 10/10/20   Arrien,  York Ram, MD  aspirin EC 81 MG tablet Take 81 mg by mouth daily. Swallow whole.    [provider]  diltiazem (CARDIZEM CD) 240 MG 24 hr capsule TAKE 1 CAPSULE (240 MG TOTAL) BY MOUTH DAILY. 09/29/20 05/29/22  Angiulli, Mcarthur Rossetti, PA-C  ELIQUIS 5 MG TABS tablet TAKE 1 TABLET (5 MG TOTAL) BY MOUTH 2 (TWO) TIMES DAILY. 09/29/20 05/29/22  Angiulli, Mcarthur Rossetti, PA-C  furosemide (LASIX) 40 MG tablet Take 1 tablet (40 mg total) by mouth daily. If weight increases 3 lbs overnight or 5 lbs in 1 week, take additional 40 mg 11/10/21   Little Ishikawa, MD  gabapentin (NEURONTIN) 400 MG capsule TAKE 1 CAPSULE (400 MG TOTAL) BY MOUTH THREE TIMES DAILY. 09/29/20 05/29/22  Angiulli, Mcarthur Rossetti, PA-C  hydrALAZINE (APRESOLINE) 50 MG tablet Take 1 tablet by mouth in the morning, at noon, and at bedtime.    [provider]  isosorbide mononitrate (IMDUR) 30 MG 24 hr tablet Take 1 tablet (30 mg total) by mouth daily. 12/10/20   Vassie Loll, MD  labetalol (NORMODYNE) 200 MG tablet TAKE 2 TABLETS (400 MG TOTAL) BY MOUTH TWO TIMES DAILY. 09/29/20 05/29/22  Angiulli, Mcarthur Rossetti, PA-C  Multiple Vitamins-Minerals (MULTIVITAMIN WITH MINERALS) tablet Take 1 tablet by mouth daily. Men    [provider]  spironolactone (ALDACTONE) 50 MG tablet Take 1 tablet (50 mg total) by mouth daily. 08/15/21   Little Ishikawa, MD  traZODone (DESYREL) 100 MG tablet Take 100  mg by mouth at bedtime. 11/28/20   [provider]      Allergies    Patient has no known allergies.    Review of Systems   Review of Systems  Constitutional:  Positive for diaphoresis. Negative for fever.  HENT:  Negative for congestion and sore throat.   Eyes: Negative.   Respiratory:  Positive for shortness of breath. Negative for chest tightness.   Cardiovascular:  Positive for chest pain.       Left hand and forearm edema.  Gastrointestinal:  Positive for nausea. Negative for abdominal pain and vomiting.  Genitourinary:  Negative.   Musculoskeletal:  Negative for arthralgias, joint swelling and neck pain.  Skin: Negative.  Negative for rash and wound.  Neurological:  Negative for dizziness, weakness, light-headedness, numbness and headaches.  Psychiatric/Behavioral: Negative.      Physical Exam Updated Vital Signs BP (!) 141/88 (BP Location: Right Arm)   Pulse (!) 109   Temp 98.9 F (37.2 C)   Resp 18   Ht 5\' 10"  (1.778 m)   Wt 110.2 kg   SpO2 95%   BMI 34.87 kg/m  Physical Exam Vitals and nursing note reviewed.  Constitutional:      Appearance: He is well-developed.  HENT:     Head: Normocephalic and atraumatic.  Eyes:     Conjunctiva/sclera: Conjunctivae normal.  Cardiovascular:     Rate and Rhythm: Regular rhythm. Tachycardia present.     Heart sounds: Normal heart sounds.  Pulmonary:     Effort: Pulmonary effort is normal.     Breath sounds: Normal breath sounds. No wheezing or rhonchi.  Abdominal:     General: Abdomen is protuberant. Bowel sounds are normal.     Palpations: Abdomen is soft.     Tenderness: There is no abdominal tenderness.  Musculoskeletal:        General: Normal range of motion.     Cervical back: Normal range of motion.     Comments: Pitting edema left hand through upper forearm including fingers.  No skin lesions or injury, no obvious erythema. Equally warm compared to right.    Skin:    General: Skin is warm and dry.  Neurological:     Mental Status: He is alert.     ED Results / Procedures / Treatments   Labs (all labs ordered are listed, but only abnormal results are displayed) Labs Reviewed  BASIC METABOLIC PANEL - Abnormal; Notable for the following components:      Result Value   Glucose, Bld 100 (*)    BUN 25 (*)    Creatinine, Ser 1.51 (*)    Calcium 8.6 (*)    GFR, Estimated 51 (*)    All other components within normal limits  CBC - Abnormal; Notable for the following components:   RBC 3.57 (*)    Hemoglobin 11.2 (*)    HCT 35.5 (*)     All other components within normal limits  TROPONIN I (HIGH SENSITIVITY) - Abnormal; Notable for the following components:   Troponin I (High Sensitivity) 20 (*)    All other components within normal limits  TROPONIN I (HIGH SENSITIVITY) - Abnormal; Notable for the following components:   Troponin I (High Sensitivity) 20 (*)    All other components within normal limits  BRAIN NATRIURETIC PEPTIDE  D-DIMER, QUANTITATIVE    EKG None ED ECG REPORT   Date: 01/23/2023  Rate: 108  Rhythm: sinus tachycardia  QRS Axis: left  Intervals: normal  ST/T Wave abnormalities: normal  Conduction Disutrbances:none  Narrative Interpretation:   Old EKG Reviewed:  afib, rate 77 05/29/22  I have personally reviewed the EKG tracing and agree with the computerized printout as noted.  Radiology DG Chest 2 View  Result Date: 01/23/2023 CLINICAL DATA:  Chest pain. EXAM: CHEST - 2 VIEW COMPARISON:  April 26, 2021. FINDINGS: Stable cardiomegaly. Stent graft is again noted in thoracic aortic arch and descending thoracic aorta. No acute pulmonary disease is noted. Bony thorax is unremarkable. IMPRESSION: No active cardiopulmonary disease. Electronically Signed   By: Lupita Raider M.D.   On: 01/23/2023 17:36    Procedures Procedures  {Document cardiac monitor, telemetry assessment procedure when appropriate:1}  Medications Ordered in ED Medications - No data to display  ED Course/ Medical Decision Making/ A&P   {   Click here for ABCD2, HEART and other calculatorsREFRESH Note before signing :1}                          Medical Decision Making Patient presenting with persistent right-sided chest pain which radiates into the left, increased shortness of breath when he is supine, symptoms reminding him of his symptoms are associated with prior aortic dissection complications requiring intervention.  Concerning for the possibility of worsening dissection, per [facility] records last CT from August 2023 showed an  aneurysm of 5.2 cm, CT imaging ordered today to assess for possible worsening of this aneurysm.  Left hand and forearm edema of unclear etiology.  Normal WBC count, which does not eliminate possibility of infection, but exam does not suggest this.  This could be DVT, patient does state he has been compliant with his Eliquis.    Amount and/or Complexity of Data Reviewed Labs: ordered.    Details: Lab results here so far reassuring, he has a normal WBC count at 8.2, he does have a creatinine of 1.51 which is consistent with his chronic renal insufficiency.  His BNP is normal range at 78, D-dimer is currently pending, his delta troponins are flat at 20. Radiology: ordered.    Details: Chest x-ray negative for acute cardiopulmonary disease.  CT dissection study is pending.  Plain imaging of the left hand and forearm pending     {Document critical care time when appropriate:1} {Document review of labs and clinical decision tools ie heart score, Chads2Vasc2 etc:1}  {Document your independent review of radiology images, and any outside records:1} {Document your discussion with family members, caretakers, and with consultants:1} {Document social determinants of health affecting pt's care:1} {Document your decision making why or why not admission, treatments were needed:1} Final Clinical Impression(s) / ED Diagnoses Final diagnoses:  None    Rx / DC Orders ED Discharge Orders     None

## 2023-01-23 NOTE — ED Provider Notes (Signed)
[place] EMERGENCY DEPARTMENT AT [facility] Provider Note   CSN: [id number] Arrival date & time: 01/23/23  1627     History  Chief Complaint  Patient presents with   Chest Pain    Connor [name] is a 67 y.o. male with a complicated medical history, most significant today for history of atrial fibrillation on Eliquis, history of CHF, CAD with CABG and  history of thoracic aneurysm dissection with surgical history of aortic aneurysm repair with 3 additional revisions, initially in 2016, with last surgery in 2022, followed by CVTS at [facility] presenting with a 3-day history of right-sided chest pain which radiates into his left chest, worse when he is supine but constantly present, also with increased shortness of breath and episodic transient palpitations. He states his symptoms remind him of symptoms related to prior aorta complications requiring surgery.  Per review of chart he was last seen by his thoracic surgeon August 2023 at which time his CT imaging showed an aneurysm of 5.2 cm distal to his TEVAR, plan at that time was a 1 year follow-up, would discuss repair once this measurement is 6 cm.   He also woke up 2 mornings ago with significant swelling and discomfort in his left arm of unclear etiology.  He denies any injuries, initially felt he may have just "slept wrong on his arm" he has been using ice without any improvement in this symptom.  He denies fevers or chills but has had some episodes of diaphoresis has also had mild nausea without emesis.      The history is provided by the patient.       Home Medications Prior to Admission medications   Medication Sig Start Date End Date Taking? Authorizing Provider  acetaminophen (TYLENOL) 325 MG tablet Take 2 tablets (650 mg total) by mouth every 6 (six) hours as needed for moderate pain. Patient not taking: Reported on 05/29/2022 10/10/20   Arrien, York Ram, MD  aspirin EC 81 MG tablet Take 81 mg by mouth daily.  Swallow whole.    [provider]  diltiazem (CARDIZEM CD) 240 MG 24 hr capsule TAKE 1 CAPSULE (240 MG TOTAL) BY MOUTH DAILY. 09/29/20 05/29/22  Angiulli, Mcarthur Rossetti, PA-C  ELIQUIS 5 MG TABS tablet TAKE 1 TABLET (5 MG TOTAL) BY MOUTH 2 (TWO) TIMES DAILY. 09/29/20 05/29/22  Angiulli, Mcarthur Rossetti, PA-C  furosemide (LASIX) 40 MG tablet Take 1 tablet (40 mg total) by mouth daily. If weight increases 3 lbs overnight or 5 lbs in 1 week, take additional 40 mg 11/10/21   Little Ishikawa, MD  gabapentin (NEURONTIN) 400 MG capsule TAKE 1 CAPSULE (400 MG TOTAL) BY MOUTH THREE TIMES DAILY. 09/29/20 05/29/22  Angiulli, Mcarthur Rossetti, PA-C  hydrALAZINE (APRESOLINE) 50 MG tablet Take 1 tablet by mouth in the morning, at noon, and at bedtime.    [provider]  isosorbide mononitrate (IMDUR) 30 MG 24 hr tablet Take 1 tablet (30 mg total) by mouth daily. 12/10/20   Vassie Loll, MD  labetalol (NORMODYNE) 200 MG tablet TAKE 2 TABLETS (400 MG TOTAL) BY MOUTH TWO TIMES DAILY. 09/29/20 05/29/22  Angiulli, Mcarthur Rossetti, PA-C  Multiple Vitamins-Minerals (MULTIVITAMIN WITH MINERALS) tablet Take 1 tablet by mouth daily. Men    [provider]  spironolactone (ALDACTONE) 50 MG tablet Take 1 tablet (50 mg total) by mouth daily. 08/15/21   Little Ishikawa, MD  traZODone (DESYREL) 100 MG tablet Take 100 mg by mouth at bedtime. 11/28/20   [provider]      Allergies    Patient has no known allergies.    Review of Systems   Review of Systems  Constitutional:  Positive for diaphoresis. Negative for fever.  HENT:  Negative for congestion and sore throat.   Eyes: Negative.   Respiratory:  Positive for shortness of breath. Negative for chest tightness.   Cardiovascular:  Positive for chest pain.       Left hand and forearm edema.  Gastrointestinal:  Positive for nausea. Negative for abdominal pain and vomiting.  Genitourinary: Negative.   Musculoskeletal:  Negative for arthralgias, joint swelling  and neck pain.  Skin: Negative.  Negative for rash and wound.  Neurological:  Negative for dizziness, weakness, light-headedness, numbness and headaches.  Psychiatric/Behavioral: Negative.      Physical Exam Updated Vital Signs BP (!) 148/91   Pulse (!) 107   Temp 98.9 F (37.2 C)   Resp (!) 21   Ht 5\' 10"  (1.778 m)   Wt 110.2 kg   SpO2 96%   BMI 34.87 kg/m  Physical Exam Vitals and nursing note reviewed.  Constitutional:      Appearance: He is well-developed.  HENT:     Head: Normocephalic and atraumatic.  Eyes:     Conjunctiva/sclera: Conjunctivae normal.  Cardiovascular:     Rate and Rhythm: Regular rhythm. Tachycardia present.     Heart sounds: Normal heart sounds.  Pulmonary:     Effort: Pulmonary effort is normal.     Breath sounds: Normal breath sounds. No wheezing or rhonchi.  Abdominal:     General: Abdomen is protuberant. Bowel sounds are normal.     Palpations: Abdomen is soft.     Tenderness: There is no abdominal tenderness.  Musculoskeletal:        General: Normal range of motion.     Cervical back: Normal range of motion.     Comments: Pitting edema left hand through upper forearm including fingers.  No skin lesions or injury, no obvious erythema. Equally warm compared to right.    Skin:    General: Skin is warm and dry.  Neurological:     Mental Status: He is alert.     ED Results / Procedures / Treatments   Labs (all labs ordered are listed, but only abnormal results are displayed) Labs Reviewed  BASIC METABOLIC PANEL - Abnormal; Notable for the following components:      Result Value   Glucose, Bld 100 (*)    BUN 25 (*)    Creatinine, Ser 1.51 (*)    Calcium 8.6 (*)    GFR, Estimated 51 (*)    All other components within normal limits  CBC - Abnormal; Notable for the following components:   RBC 3.57 (*)    Hemoglobin 11.2 (*)    HCT 35.5 (*)    All other components within normal limits  D-DIMER, QUANTITATIVE - Abnormal; Notable for the  following components:   D-Dimer, Quant 8.71 (*)    All other components within normal limits  TROPONIN I (HIGH SENSITIVITY) - Abnormal; Notable for the following components:   Troponin I (High Sensitivity) 20 (*)    All other components within normal limits  TROPONIN I (HIGH SENSITIVITY) - Abnormal; Notable for the following components:   Troponin I (High Sensitivity) 20 (*)    All other components within normal limits  BRAIN NATRIURETIC PEPTIDE    EKG None ED ECG REPORT   Date: 01/23/2023  Rate: 108  Rhythm:  sinus tachycardia  QRS Axis: left  Intervals: normal  ST/T Wave abnormalities: normal  Conduction Disutrbances:none  Narrative Interpretation:   Old EKG Reviewed:  afib, rate 77 05/29/22  I have personally reviewed the EKG tracing and agree with the computerized printout as noted.  Radiology CT Angio Chest Aorta W and/or Wo Contrast  Result Date: 01/24/2023 CLINICAL DATA:  Aortic aneurysm suspected.  Right side chest pain. EXAM: CT ANGIOGRAPHY CHEST WITH CONTRAST TECHNIQUE: Multidetector CT imaging of the chest was performed using the standard protocol during bolus administration of intravenous contrast. Multiplanar CT image reconstructions and MIPs were obtained to evaluate the vascular anatomy. RADIATION DOSE REDUCTION: This exam was performed according to the departmental dose-optimization program which includes automated exposure control, adjustment of the mA and/or kV according to patient size and/or use of iterative reconstruction technique. CONTRAST:  75mL OMNIPAQUE IOHEXOL 350 MG/ML SOLN COMPARISON:  02/04/2021 FINDINGS: Cardiovascular: Previous stent graft repair in the aortic arch and descending thoracic aorta. The previously seen false lumen related to dissection is thrombosed. Maximum aortic diameter in the descending thoracic aorta 5.8 cm, stable. Tube graft repair of the ascending thoracic aorta, stable. Heart borderline in size. Scattered coronary artery  calcifications. Mediastinum/Nodes: Borderline sized mediastinal lymph nodes. Right paratracheal lymph node has a short axis diameter of 10 mm compared to 17 mm previously no axillary or hilar adenopathy. Lungs/Pleura: Areas of atelectasis in the left lower lobe adjacent to the descending thoracic aortic aneurysm. Mild vascular congestion. No acute confluent opacities or effusions. Upper Abdomen: Multiple gallstones within the gallbladder. No acute findings. Dissection noted in the abdominal aorta below the stent graft which terminates at the aortic hiatus. Dissection flap continues in the celiac artery and SMA, unchanged since prior study. Maximum aortic diameter in the upper abdomen 44.7 cm. This is stable since prior study. Musculoskeletal: Chest wall soft tissues are unremarkable. No acute bony abnormality. Review of the MIP images confirms the above findings. IMPRESSION: Prior stent graft repair of the previously seen aortic dissection. Maximum aortic diameter in the descending thoracic aorta 5.8 cm, stable since prior study. Thrombosed false lumen. Prior tube graft repair of the ascending thoracic aorta. No acute cardiopulmonary disease. 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 Electronically Signed   By: Charlett Nose M.D.   On: 01/24/2023 00:09   DG Hand Complete Left  Result Date: 01/24/2023 CLINICAL DATA:  Edema EXAM: LEFT HAND - COMPLETE 3+ VIEW COMPARISON:  None Available. FINDINGS: No fracture or malalignment. Diffuse soft tissue edema without foreign body or emphysema. IMPRESSION: Soft tissue edema without acute osseous abnormality. Electronically Signed   By: Jasmine Pang M.D.   On: 01/24/2023 00:01   DG Forearm Left  Result Date: 01/24/2023 CLINICAL DATA:  Edema EXAM: LEFT FOREARM - 2 VIEW COMPARISON:  None Available. FINDINGS: No fracture or malalignment. Diffuse soft tissue edema  without foreign body or emphysema. IMPRESSION: Soft tissue edema without acute osseous abnormality. Electronically Signed   By: Jasmine Pang M.D.   On: 01/24/2023 00:00   DG Chest 2 View  Result Date: 01/23/2023 CLINICAL DATA:  Chest pain. EXAM: CHEST - 2 VIEW COMPARISON:  April 26, 2021. FINDINGS: Stable cardiomegaly. Stent graft is again noted in thoracic aortic arch and descending thoracic aorta. No acute pulmonary disease is noted. Bony thorax is unremarkable. IMPRESSION: No active cardiopulmonary disease. Electronically Signed   By: Lupita Raider M.D.   On:  01/23/2023 17:36    Procedures Procedures    Medications Ordered in ED Medications  apixaban (ELIQUIS) tablet 5 mg (has no administration in time range)  gabapentin (NEURONTIN) capsule 400 mg (has no administration in time range)  labetalol (NORMODYNE) tablet 200 mg (has no administration in time range)  hydrALAZINE (APRESOLINE) tablet 50 mg (has no administration in time range)  iohexol (OMNIPAQUE) 350 MG/ML injection 75 mL (75 mLs Intravenous Contrast Given 01/23/23 2330)    ED Course/ Medical Decision Making/ A&P                             Medical Decision Making Patient presenting with persistent right-sided chest pain which radiates into the left, increased shortness of breath when he is supine, symptoms reminding him of his symptoms are associated with prior aortic dissection complications requiring intervention.  Concerning for the possibility of worsening dissection, per [facility] records last CT from August 2023 showed an aneurysm of 5.2 cm, CT imaging ordered today to assess for possible worsening of this aneurysm.  Left hand and forearm edema of unclear etiology.  Normal WBC count, which does not eliminate possibility of infection, but exam does not suggest this.  This could be DVT, patient does state he has been compliant with his Eliquis.    Amount and/or Complexity of Data Reviewed Labs: ordered.    Details: Lab  results here so far reassuring, he has a normal WBC count at 8.2, he does have a creatinine of 1.51 which is consistent with his chronic renal insufficiency.  His BNP is normal range at 78, D-dimer is currently pending, his delta troponins are flat at 20. Radiology: ordered.    Details: Chest x-ray negative for acute cardiopulmonary disease.  CT dissection study shows stable findings - his aneurysm measures 5.8 today.  Xrays left hand and forearm soft tissue swelling only.  Risk Decision regarding hospitalization. Risk Details: No indication for hospitalization at this time.  Pt is strongly encouraged to contact his cvts specialist at [facility] to advise of his CT findings today and for close f/u.  OP [place] left upper extremity ordered for tomorrow to r/o dvt.  Pt chest pain free at time of dc.  Note,  he remains slightly tachycardic, 100-107 range.  He endorses he has missed his evening labetalol, hydralazine while here, ordered and given prior to dc home.            Final Clinical Impression(s) / ED Diagnoses Final diagnoses:  Atypical chest pain  Edema of left upper extremity    Rx / DC Orders ED Discharge Orders          Ordered    US Venous Img Upper Uni Left        01/24/23 0116              Burgess Amor, PA-C 01/24/23 0123    Vanetta Mulders, MD 01/26/23 2329

## 2023-01-23 NOTE — ED Notes (Signed)
Ice pack given for left arm per pt request.

## 2023-01-24 DIAGNOSIS — R0789 Other chest pain: Secondary | ICD-10-CM | POA: Diagnosis not present

## 2023-01-24 MED ORDER — APIXABAN 5 MG PO TABS
5.0000 mg | ORAL_TABLET | Freq: Once | ORAL | Status: AC
  Administered 2023-01-24: 5 mg via ORAL
  Filled 2023-01-24: qty 1

## 2023-01-24 MED ORDER — HYDRALAZINE HCL 25 MG PO TABS
50.0000 mg | ORAL_TABLET | Freq: Once | ORAL | Status: AC
  Administered 2023-01-24: 50 mg via ORAL
  Filled 2023-01-24: qty 2

## 2023-01-24 MED ORDER — GABAPENTIN 400 MG PO CAPS
400.0000 mg | ORAL_CAPSULE | Freq: Once | ORAL | Status: AC
  Administered 2023-01-24: 400 mg via ORAL
  Filled 2023-01-24: qty 1

## 2023-01-24 MED ORDER — LABETALOL HCL 200 MG PO TABS
200.0000 mg | ORAL_TABLET | Freq: Once | ORAL | Status: AC
  Administered 2023-01-24: 200 mg via ORAL
  Filled 2023-01-24: qty 1

## 2023-01-24 NOTE — Discharge Instructions (Signed)
You have been scheduled for an out patient ultrasound of your left arm to get more information about the swelling, specifically to see if this is a blood clot causing your symptoms.  Call the phone number listed in the morning to schedule an appointment time.  I also recommend calling your vascular specialist at [facility] tomorrow letting them know that you had the CT scan today so that they can review it and determine if you need any further management regarding these findings.  Please let them know that your aneurysm measures 5.8 cm with today's CT scan.  In the interim return here for any worsening or new symptoms.

## 2023-01-26 ENCOUNTER — Ambulatory Visit (HOSPITAL_COMMUNITY): Admission: RE | Admit: 2023-01-26 | Payer: 59 | Source: Ambulatory Visit

## 2023-02-25 ENCOUNTER — Ambulatory Visit (HOSPITAL_COMMUNITY)
Admission: RE | Admit: 2023-02-25 | Discharge: 2023-02-25 | Disposition: A | Discharge status: Home / Self Care | Payer: 59 | Source: Ambulatory Visit | Attending: Emergency Medicine | Admitting: Emergency Medicine

## 2023-02-25 DIAGNOSIS — I808 Phlebitis and thrombophlebitis of other sites: Secondary | ICD-10-CM | POA: Diagnosis not present

## 2023-02-25 DIAGNOSIS — R609 Edema, unspecified: Secondary | ICD-10-CM | POA: Diagnosis present

## 2023-03-09 IMAGING — DX DG CHEST 1V PORT
1 series · 1 of 1 positions shown · non-contrast
Comparison: Chest x-ray 10/06/2020

CLINICAL DATA: Shortness of breath.

EXAM:
PORTABLE CHEST 1 VIEW

[chest ap]
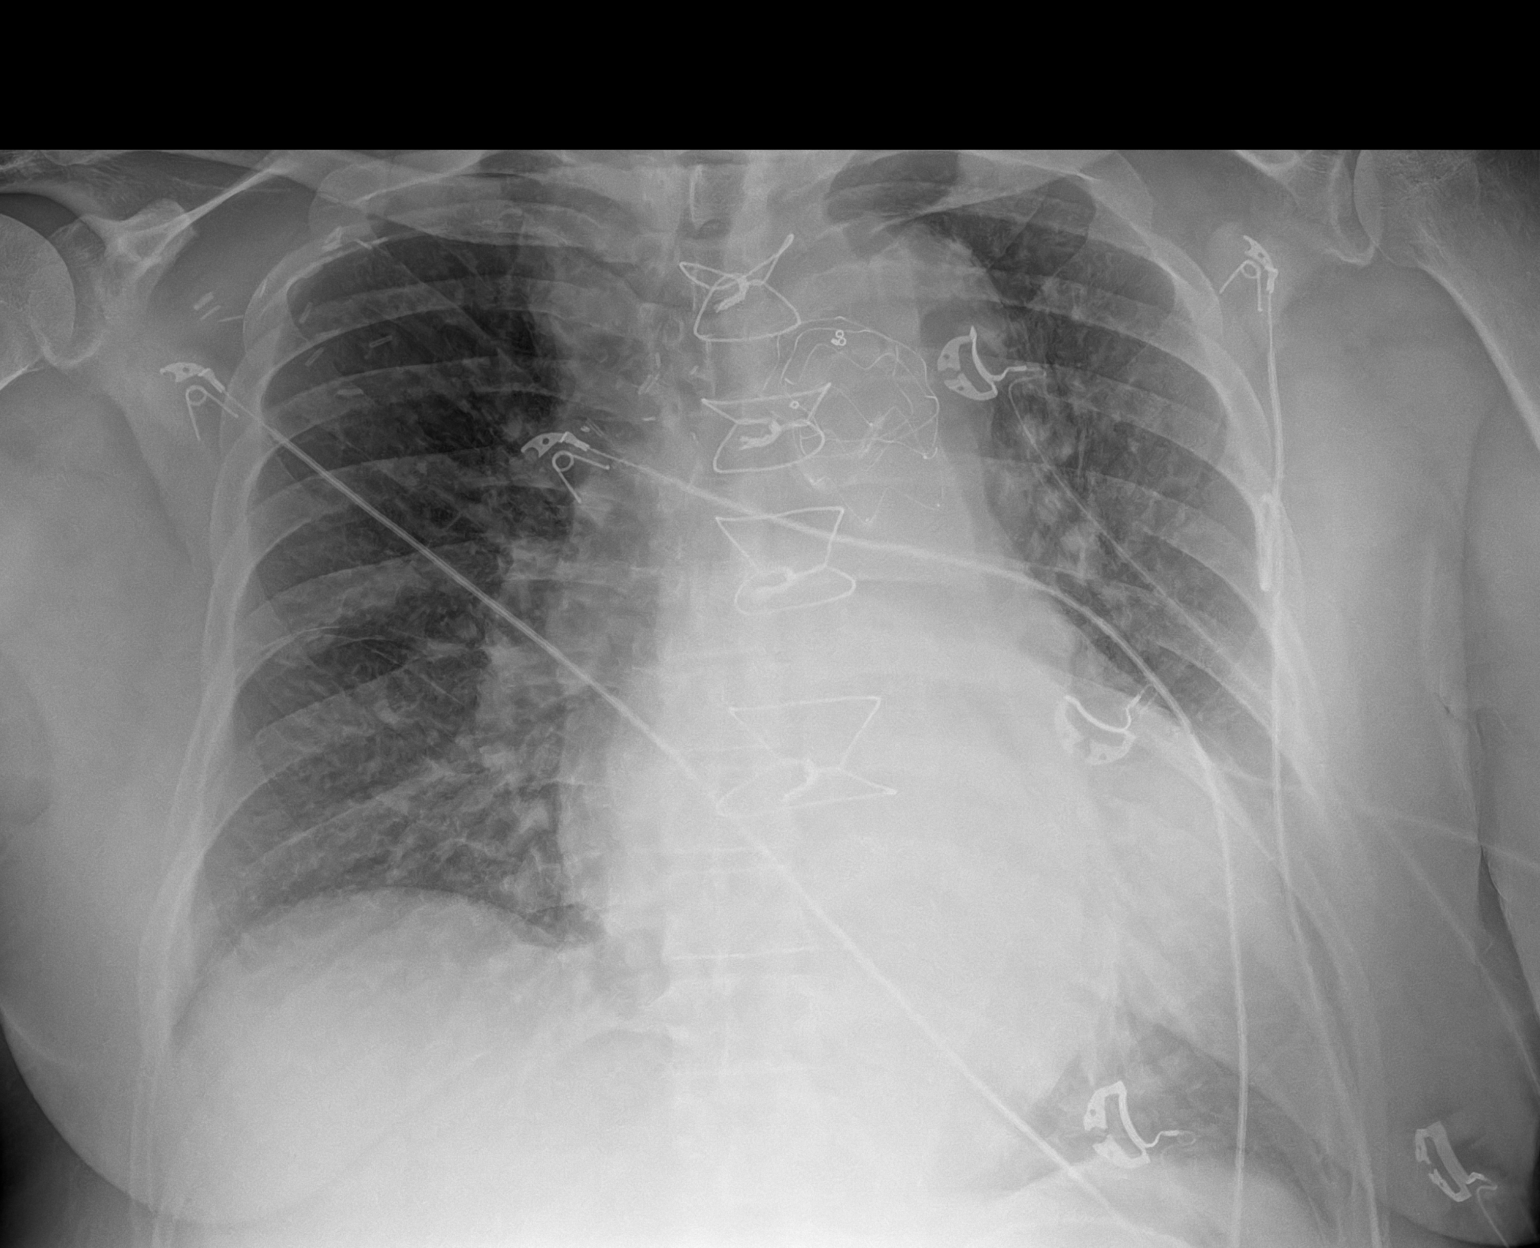

[1 of 1 positions shown; findings below may reference images not displayed]

FINDINGS: Redemonstration of an enlarged cardiac silhouette. Redemonstration
of an enlarged aortic arch. The heart size and mediastinal contours
are unchanged with redemonstration of aortic arch/proximal
descending thoracic aorta stent graft. Sternotomy wires appear
intact.

No focal consolidation. Increased interstitial markings. No pleural
effusion. No pneumothorax.

No acute osseous abnormality.
IMPRESSION: 1. Pulmonary edema with no definite pleural effusion.
2. Status post aortic stent graft with redemonstration of an
enlarged aortic arch.

## 2023-06-13 ENCOUNTER — Ambulatory Visit: Payer: Medicare HMO | Attending: General Practice | Admitting: Emergency Medicine

## 2023-06-13 ENCOUNTER — Encounter: Payer: Self-pay | Admitting: General Practice

## 2023-06-13 VITALS — BP 102/74 | HR 85 | Ht 70.0 in | Wt 246.0 lb

## 2023-06-13 DIAGNOSIS — I7100 Dissection of unspecified site of aorta: Secondary | ICD-10-CM

## 2023-06-13 DIAGNOSIS — I10 Essential (primary) hypertension: Secondary | ICD-10-CM | POA: Diagnosis not present

## 2023-06-13 DIAGNOSIS — G4733 Obstructive sleep apnea (adult) (pediatric): Secondary | ICD-10-CM

## 2023-06-13 DIAGNOSIS — I480 Paroxysmal atrial fibrillation: Secondary | ICD-10-CM | POA: Diagnosis not present

## 2023-06-13 NOTE — Patient Instructions (Signed)
Medication Instructions:  The current medical regimen is effective;  continue present plan and medications as directed. Please refer to the Current Medication list given to you today.  *If you need a refill on your cardiac medications before your next appointment, please call your pharmacy*   Lab Work: NONE If you have labs (blood work) drawn today and your tests are completely normal, you will receive your results only by: MyChart Message (if you have MyChart) OR  A paper copy in the mail If you have any lab test that is abnormal or we need to change your treatment, we will call you to review the results.  Other Instructions CONTINUE TAKING YOUR BLOOD PRESSURE WEIGH DAILY PLEASE READ CESSATION TIPS ATTACHED   Follow-Up: At [facility], you and your health needs are our priority.  As part of our continuing mission to provide you with exceptional heart care, we have created designated Provider Care Teams.  These Care Teams include your primary Cardiologist (physician) and Advanced Practice Providers (APPs -  Physician Assistants and Nurse Practitioners) who all work together to provide you with the care you need, when you need it.  We recommend signing up for the patient portal called "MyChart".  Sign up information is provided on this After Visit Summary.  MyChart is used to connect with patients for Virtual Visits (Telemedicine).  Patients are able to view lab/test results, encounter notes, upcoming appointments, etc.  Non-urgent messages can be sent to your provider as well.   To learn more about what you can do with MyChart, go to [email or web address].    Your next appointment:   6 month(s)  Provider:   Little Ishikawa, MD         Steps to Quit Smoking Smoking tobacco is the leading cause of preventable death. It can affect almost every organ in the body. Smoking puts you and people around you at risk for many serious, long-lasting (chronic) diseases. Quitting  smoking can be hard, but it is one of the best things that you can do for your health. It is never too late to quit. Do not give up if you cannot quit the first time. Some people need to try many times to quit. Do your best to stick to your quit plan, and talk with your doctor if you have any questions or concerns. How do I get ready to quit? Pick a date to quit. Set a date within the next 2 weeks to give you time to prepare. Write down the reasons why you are quitting. Keep this list in places where you will see it often. Tell your family, friends, and co-workers that you are quitting. Their support is important. Talk with your doctor about the choices that may help you quit. Find out if your health insurance will pay for these treatments. Know the people, places, things, and activities that make you want to smoke (triggers). Avoid them. What first steps can I take to quit smoking? Throw away all cigarettes at home, at work, and in your car. Throw away the things that you use when you smoke, such as ashtrays and lighters. Clean your car. Empty the ashtray. Clean your home, including curtains and carpets. What can I do to help me quit smoking? Talk with your doctor about taking medicines and seeing a counselor. You are more likely to succeed when you do both. If you are pregnant or breastfeeding: Talk with your doctor about counseling or other ways to quit smoking. Do not  take medicine to help you quit smoking unless your doctor tells you to. Quit right away Quit smoking completely, instead of slowly cutting back on how much you smoke over a period of time. Stopping smoking right away may be more successful than slowly quitting. Go to counseling. In-person is best if this is an option. You are more likely to quit if you go to counseling sessions regularly. Take medicine You may take medicines to help you quit. Some medicines need a prescription, and some you can buy over-the-counter. Some  medicines may contain a drug called nicotine to replace the nicotine in cigarettes. Medicines may: Help you stop having the desire to smoke (cravings). Help to stop the problems that come when you stop smoking (withdrawal symptoms). Your doctor may ask you to use: Nicotine patches, gum, or lozenges. Nicotine inhalers or sprays. Non-nicotine medicine that you take by mouth. Find resources Find resources and other ways to help you quit smoking and remain smoke-free after you quit. They include: Online chats with a Veterinary surgeon. Phone quitlines. Printed Materials engineer. Support groups or group counseling. Text messaging programs. Mobile phone apps. Use apps on your mobile phone or tablet that can help you stick to your quit plan. Examples of free services include Quit Guide from the CDC and smokefree.gov  What can I do to make it easier to quit?  Talk to your family and friends. Ask them to support and encourage you. Call a phone quitline, such as 1-800-QUIT-NOW, reach out to support groups, or work with a Veterinary surgeon. Ask people who smoke to not smoke around you. Avoid places that make you want to smoke, such as: Bars. Parties. Smoke-break areas at work. Spend time with people who do not smoke. Lower the stress in your life. Stress can make you want to smoke. Try these things to lower stress: Getting regular exercise. Doing deep-breathing exercises. Doing yoga. Meditating. What benefits will I see if I quit smoking? Over time, you may have: A better sense of smell and taste. Less coughing and sore throat. A slower heart rate. Lower blood pressure. Clearer skin. Better breathing. Fewer sick days. Summary Quitting smoking can be hard, but it is one of the best things that you can do for your health. Do not give up if you cannot quit the first time. Some people need to try many times to quit. When you decide to quit smoking, make a plan to help you succeed. Quit smoking right  away, not slowly over a period of time. When you start quitting, get help and support to keep you smoke-free. This information is not intended to replace advice given to you by your health care provider. Make sure you discuss any questions you have with your health care provider. Document Revised: 08/04/2021 Document Reviewed: 08/04/2021 Elsevier Patient Education  2024 [employer].

## 2023-06-13 NOTE — Progress Notes (Signed)
Cardiology Office Note:    Date:  06/13/2023  ID:  Connor [name], DOB 10-04-1955, MRN [id number] PCP: Oneal Grout, FNP  [facility] Providers Cardiologist:  Little Ishikawa, MD       Patient Profile:      Atrial Fibrillation Hypertension Aortic Dissection Tobacco Abuse       History of Present Illness:   Connor [name] is a 67 y.o. male who returns for follow-up of atrial fibrillation, hypertension.  He has history of a type A aortic dissection s/p open repair ([place], 2016) with recurrence at the distal anastomosis s/p frozen elephant trunk and total arch replacement (04/2018, [facility]), left renal artery and left common and external iliac stenting (08/2018) who is also s/p TEVAR extension and left renal artery stenting (01/2021).  He follows with [facility] vascular surgery for this.  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  [facility] vascular does plan to follow-up with Connor [name] in 5 months for CT Endo chest abdomen pelvis with contrast.  There was a 8.1 cm fat-containing lesion in the right axilla with soft tissue density found on his latest CTA.  Originally present back in 2022 with a indeterminate nature and slow-growing soft tissue fatty mass.  Connor [name] was encouraged to follow-up with his PCP regarding this.   He was admitted 1/4 through 09/19/2020 with necrotizing fasciitis of left leg and strep bacteremia, he required AKA of left leg.  Echocardiogram at that time 09/01/2020 showed EF 60 to 65%, severe LVH, grade 2 diastolic dysfunction, normal right ventricular function, severe left atrial dilation, moderate right atrial dilation, no significant valvular disease. History persistent atrial fibrillation, originally found at clinic visit on 01/05/2020.  Underwent successful DCCV on 02/11/2020 but subsequently back into A-fib.  On Eliquis, Dital exam, labetalol. History of resistant hypertension and  also issues with persistent hypokalemia.  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  It was suggested to titrate up his spironolactone.  Today he is doing well overall.  He denies any chest pain, palpitations, tachycardia.  Notes that when he does get up to walk around the house he will get short of breath but when he stops to rest it resolves quickly, which is normal for him.  He notes that he tried to quit smoking, stop for a week however he has began smoking once again.  He notes he would like to stop, education was given on changing habits.  He notes that during his ED visit on 01/23/2023 he felt as if his chest pain was coming from smoking cigarettes and the incision site from his dissection study.  He denies chest pain on visit, notes that his scar from his prior aortic dissection surgery is tender to touch.  He reports he gets minimal physical exercise due to his amputation, not currently using his prosthetic as it is not comfortable.  He notes that he is not using his CPAP for his OSA due to the fact that he never received his machine.  We will have somebody reach out to him for this.  He notes that he follows back up with [facility] vascular to have a repeat CTA of his aorta as it grew 1 mm in size since his last follow-up visit.  He denies any chest pain, syncope, abdominal pain, back pain.  He also has reached out to his PCP as suspicious finding on CT scan and right axilla, he is waiting  for his PCP to order an MRI.  He notes he has not had any tachycardia/palpitations, he told me when he knows when he goes into atrial fibrillation, he has a stethoscope that he listens to his heart himself.  He denies going into any A-fib attacks since the last time we have seen him.  EKG 01/23/2023 he was in sinus rhythm, normal rate and rhythm to palpation and auscultation today.  He denies chest pain, lower extremity edema, fatigue, palpitations, melena, hematuria, hemoptysis, diaphoresis, presyncope, syncope,  orthopnea, and PND.            Review of Systems  Constitutional: Negative for weight gain and weight loss.  Cardiovascular:  Negative for chest pain, claudication, cyanosis, dyspnea on exertion, irregular heartbeat, leg swelling, near-syncope, orthopnea, palpitations, paroxysmal nocturnal dyspnea and syncope.  Respiratory:  Positive for shortness of breath. Negative for cough and hemoptysis.   Gastrointestinal:  Negative for abdominal pain, hematochezia and melena.  Genitourinary:  Negative for hematuria.     See HPI     Studies Reviewed:       ECHO 09/01/2020 1. Left ventricular ejection fraction, by estimation, is 60 to 65%. The  left ventricle has normal function. The left ventricle has no regional  wall motion abnormalities. There is severe left ventricular hypertrophy.  Left ventricular diastolic parameters   are consistent with Grade II diastolic dysfunction (pseudonormalization).   2. Right ventricular systolic function is normal. The right ventricular  size is normal. There is normal pulmonary artery systolic pressure. The  estimated right ventricular systolic pressure is 29.6 mmHg.   3. Left atrial size was severely dilated.   4. Right atrial size was moderately dilated.   5. The mitral valve is grossly normal. Trivial mitral valve  regurgitation.   6. The aortic valve is tricuspid. Aortic valve regurgitation is not  visualized.   7. The inferior vena cava is normal in size with greater than 50%  respiratory variability, suggesting right atrial pressure of 3 mmHg.   Long Term Monitor 03/30/2023    Monitor read as 100% atrial flutter burden, average rate 85 bpm, but I do not see clear flutter waves.  Recommend twelve-lead EKG   2 pauses, longest lasting 4 seconds.  Pauses occurred at 3 AM  Risk Assessment/Calculations:    CHA2DS2-VASc Score = 5   This indicates a 7.2% annual risk of stroke. The patient's score is based upon: CHF History: 1 HTN History:  0 Diabetes History: 1 Stroke History: 2 Vascular Disease History: 0 Age Score: 1 Gender Score: 0            Physical Exam:   VS:  BP 102/74 (BP Location: Left Arm, Patient Position: Sitting, Cuff Size: Normal)   Pulse 85   Ht 5\' 10"  (1.778 m)   Wt 246 lb (111.6 kg)   SpO2 96%   BMI 35.30 kg/m    Wt Readings from Last 3 Encounters:  06/13/23 246 lb (111.6 kg)  01/23/23 243 lb (110.2 kg)  05/29/22 248 lb (112.5 kg)    Constitutional:      Appearance: Normal and healthy appearance.  Neck:     Vascular: JVD normal.  Pulmonary:     Effort: Pulmonary effort is normal.     Breath sounds: Normal breath sounds.  Chest:     Chest wall: Not tender to palpatation.  Cardiovascular:     PMI at left midclavicular line. Normal rate. Regular rhythm. Normal S1. Normal S2.  Murmurs: There is no murmur.     No gallop.  No click. No rub.  Pulses:    Intact distal pulses.  Edema:    Peripheral edema absent.  Musculoskeletal:     Cervical back: Normal range of motion and neck supple. Skin:    General: Skin is warm and dry.  Neurological:     General: No focal deficit present.     Mental Status: Alert and oriented to person, place and time.  Psychiatric:        Behavior: Behavior is cooperative.        Assessment and Plan:  Atrial fibrillation -CHA2DS2-VASc Score = 5 , heart rate of 85 -No Afib today, NSR, also EKG from ED 01/23/2023 was sinus tach  -He reports compliance with Eliquis -03/29/2022 Cardiac event monitor showed 1% atrial fibrillation burden with 2 pauses, pauses felt to be episodes of OSA -Notes that he never received his CPAP -Continue Eliquis 5 mg twice daily, Cardizem to 240 mg -Avoid triggers such as caffeine, chocolate, alcohol, dehydration  OSA -Positive sleep study 11/2021 -Does not report compliance with CPAP, he never received his machine -He will call insurance to have the machine delivered -Told to call office if needed support -Reiterated importance  of CPAP and dangers of OSA  Hypertension -Blood pressure today 102/74, controlled -Continue labetalol 400 mg twice daily, hydralazine 50 mg 3 times daily, spironolactone 50 mg, Imdur 30 mg, lasix 40mg  -Heart healthy low-sodium diet, salty sick skin -Increase physical activity as tolerated -Tobacco cessation encouraged  Aortic dissection -Underwent repair for type a aortic dissection in 2016. He had dissection at anastomosis suture lines 12/19. Underwent left renal vein stenting 1/20 and then total arch replacement 2/20 in [place]. He was admitted 6/22 at [facility] for type B dissection during hypertensive crisis. On 02/06/2021 he had TEVAR extension and left renal artery stenting.  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  He would see vascular recommended repeat CT in 6 months. -Continue to follow with [facility] vascular surgery -Continue tight blood pressure of <120/80 -Tobacco cessation encouraged                 Dispo:  Return in about 6 months (around 12/12/2023).  Signed, Denyce Sung, AGNP-C

## 2023-06-20 ENCOUNTER — Other Ambulatory Visit (HOSPITAL_COMMUNITY): Payer: Self-pay | Admitting: Family Medicine

## 2023-06-20 DIAGNOSIS — R2231 Localized swelling, mass and lump, right upper limb: Secondary | ICD-10-CM

## 2023-07-12 ENCOUNTER — Ambulatory Visit (HOSPITAL_COMMUNITY)
Admission: RE | Admit: 2023-07-12 | Discharge: 2023-07-12 | Disposition: A | Discharge status: Home / Self Care | Payer: Medicare HMO | Source: Ambulatory Visit | Attending: Family Medicine | Admitting: Family Medicine

## 2023-07-12 DIAGNOSIS — R2231 Localized swelling, mass and lump, right upper limb: Secondary | ICD-10-CM | POA: Insufficient documentation

## 2023-07-12 MED ORDER — GADOBUTROL 1 MMOL/ML IV SOLN
8.8000 mL | Freq: Once | INTRAVENOUS | Status: AC | PRN
  Administered 2023-07-12: 8.8 mL via INTRAVENOUS

## 2023-08-10 ENCOUNTER — Emergency Department (HOSPITAL_COMMUNITY): Payer: Medicare HMO

## 2023-08-10 ENCOUNTER — Inpatient Hospital Stay (HOSPITAL_COMMUNITY)
Admission: EM | Admit: 2023-08-10 | Discharge: 2023-08-13 | DRG: 291 | Disposition: A | Discharge status: Home / Self Care | Payer: Medicare HMO | Attending: Family Medicine | Admitting: Family Medicine

## 2023-08-10 ENCOUNTER — Encounter (HOSPITAL_COMMUNITY): Payer: Self-pay

## 2023-08-10 ENCOUNTER — Other Ambulatory Visit: Payer: Self-pay

## 2023-08-10 DIAGNOSIS — E876 Hypokalemia: Secondary | ICD-10-CM | POA: Diagnosis present

## 2023-08-10 DIAGNOSIS — I2489 Other forms of acute ischemic heart disease: Secondary | ICD-10-CM | POA: Diagnosis present

## 2023-08-10 DIAGNOSIS — I10 Essential (primary) hypertension: Secondary | ICD-10-CM | POA: Diagnosis not present

## 2023-08-10 DIAGNOSIS — N183 Chronic kidney disease, stage 3 unspecified: Secondary | ICD-10-CM | POA: Diagnosis present

## 2023-08-10 DIAGNOSIS — I5033 Acute on chronic diastolic (congestive) heart failure: Secondary | ICD-10-CM | POA: Diagnosis present

## 2023-08-10 DIAGNOSIS — I739 Peripheral vascular disease, unspecified: Secondary | ICD-10-CM | POA: Diagnosis present

## 2023-08-10 DIAGNOSIS — I130 Hypertensive heart and chronic kidney disease with heart failure and stage 1 through stage 4 chronic kidney disease, or unspecified chronic kidney disease: Secondary | ICD-10-CM | POA: Diagnosis present

## 2023-08-10 DIAGNOSIS — E66812 Obesity, class 2: Secondary | ICD-10-CM | POA: Diagnosis present

## 2023-08-10 DIAGNOSIS — I4819 Other persistent atrial fibrillation: Secondary | ICD-10-CM | POA: Diagnosis present

## 2023-08-10 DIAGNOSIS — I5031 Acute diastolic (congestive) heart failure: Secondary | ICD-10-CM | POA: Diagnosis not present

## 2023-08-10 DIAGNOSIS — I509 Heart failure, unspecified: Secondary | ICD-10-CM

## 2023-08-10 DIAGNOSIS — N1831 Chronic kidney disease, stage 3a: Secondary | ICD-10-CM | POA: Diagnosis present

## 2023-08-10 DIAGNOSIS — E8770 Fluid overload, unspecified: Secondary | ICD-10-CM

## 2023-08-10 DIAGNOSIS — Z951 Presence of aortocoronary bypass graft: Secondary | ICD-10-CM

## 2023-08-10 DIAGNOSIS — Z87891 Personal history of nicotine dependence: Secondary | ICD-10-CM | POA: Diagnosis not present

## 2023-08-10 DIAGNOSIS — Z993 Dependence on wheelchair: Secondary | ICD-10-CM

## 2023-08-10 DIAGNOSIS — Z6836 Body mass index (BMI) 36.0-36.9, adult: Secondary | ICD-10-CM

## 2023-08-10 DIAGNOSIS — Z7901 Long term (current) use of anticoagulants: Secondary | ICD-10-CM | POA: Diagnosis not present

## 2023-08-10 DIAGNOSIS — Z7982 Long term (current) use of aspirin: Secondary | ICD-10-CM

## 2023-08-10 DIAGNOSIS — G4733 Obstructive sleep apnea (adult) (pediatric): Secondary | ICD-10-CM | POA: Diagnosis present

## 2023-08-10 DIAGNOSIS — Z79899 Other long term (current) drug therapy: Secondary | ICD-10-CM | POA: Diagnosis not present

## 2023-08-10 DIAGNOSIS — I2510 Atherosclerotic heart disease of native coronary artery without angina pectoris: Secondary | ICD-10-CM | POA: Diagnosis present

## 2023-08-10 DIAGNOSIS — J810 Acute pulmonary edema: Secondary | ICD-10-CM

## 2023-08-10 DIAGNOSIS — R0602 Shortness of breath: Secondary | ICD-10-CM | POA: Diagnosis present

## 2023-08-10 DIAGNOSIS — Z8249 Family history of ischemic heart disease and other diseases of the circulatory system: Secondary | ICD-10-CM | POA: Diagnosis not present

## 2023-08-10 DIAGNOSIS — I452 Bifascicular block: Secondary | ICD-10-CM | POA: Diagnosis present

## 2023-08-10 DIAGNOSIS — Z823 Family history of stroke: Secondary | ICD-10-CM

## 2023-08-10 DIAGNOSIS — D631 Anemia in chronic kidney disease: Secondary | ICD-10-CM | POA: Diagnosis present

## 2023-08-10 DIAGNOSIS — Z89612 Acquired absence of left leg above knee: Secondary | ICD-10-CM

## 2023-08-10 LAB — BASIC METABOLIC PANEL
Anion gap: 9 (ref 5–15)
BUN: 11 mg/dL (ref 8–23)
CO2: 28 mmol/L (ref 22–32)
Calcium: 8.7 mg/dL — ABNORMAL LOW (ref 8.9–10.3)
Chloride: 102 mmol/L (ref 98–111)
Creatinine, Ser: 1.22 mg/dL (ref 0.61–1.24)
GFR, Estimated: >60 mL/min (ref >60)
Glucose, Bld: 123 mg/dL — ABNORMAL HIGH (ref 70–99)
Potassium: 2.9 mmol/L — ABNORMAL LOW (ref 3.5–5.1)
Sodium: 139 mmol/L (ref 135–145)

## 2023-08-10 LAB — URINALYSIS, ROUTINE W REFLEX MICROSCOPIC
Bacteria, UA: NONE SEEN
Bilirubin Urine: NEGATIVE
Glucose, UA: NEGATIVE mg/dL
Ketones, ur: NEGATIVE mg/dL
Leukocytes,Ua: NEGATIVE
Nitrite: NEGATIVE
Protein, ur: NEGATIVE mg/dL
Specific Gravity, Urine: 1.006 (ref 1.005–1.030)
pH: 7 (ref 5.0–8.0)

## 2023-08-10 LAB — CBC
HCT: 31.3 % — ABNORMAL LOW (ref 39.0–52.0)
Hemoglobin: 9.6 g/dL — ABNORMAL LOW (ref 13.0–17.0)
MCH: 29.4 pg (ref 26.0–34.0)
MCHC: 30.7 g/dL (ref 30.0–36.0)
MCV: 95.7 fL (ref 80.0–100.0)
Platelets: 150 10*3/uL (ref 150–400)
RBC: 3.27 MIL/uL — ABNORMAL LOW (ref 4.22–5.81)
RDW: 14.9 % (ref 11.5–15.5)
WBC: 4.1 10*3/uL (ref 4.0–10.5)
nRBC: 0 % (ref 0.0–0.2)

## 2023-08-10 LAB — HEPATIC FUNCTION PANEL
ALT: 14 U/L (ref 0–44)
AST: 19 U/L (ref 15–41)
Albumin: 3.5 g/dL (ref 3.5–5.0)
Alkaline Phosphatase: 73 U/L (ref 38–126)
Bilirubin, Direct: 0.2 mg/dL (ref 0.0–0.2)
Indirect Bilirubin: 0.8 mg/dL (ref 0.3–0.9)
Total Bilirubin: 1 mg/dL (ref <1.2)
Total Protein: 7 g/dL (ref 6.5–8.1)

## 2023-08-10 LAB — TROPONIN I (HIGH SENSITIVITY)
Troponin I (High Sensitivity): 32 ng/L — ABNORMAL HIGH (ref <18)
Troponin I (High Sensitivity): 31 ng/L — ABNORMAL HIGH (ref <18)

## 2023-08-10 LAB — BRAIN NATRIURETIC PEPTIDE: B Natriuretic Peptide: 147 pg/mL — ABNORMAL HIGH (ref 0.0–100.0)

## 2023-08-10 LAB — MAGNESIUM: Magnesium: 1.8 mg/dL (ref 1.7–2.4)

## 2023-08-10 MED ORDER — TRAZODONE HCL 50 MG PO TABS
100.0000 mg | ORAL_TABLET | Freq: Every day | ORAL | Status: DC
  Administered 2023-08-10 – 2023-08-12 (×3): 100 mg via ORAL
  Filled 2023-08-10 (×3): qty 2

## 2023-08-10 MED ORDER — POTASSIUM CHLORIDE CRYS ER 20 MEQ PO TBCR
40.0000 meq | EXTENDED_RELEASE_TABLET | Freq: Once | ORAL | Status: AC
  Administered 2023-08-10: 40 meq via ORAL
  Filled 2023-08-10: qty 2

## 2023-08-10 MED ORDER — POTASSIUM CHLORIDE 10 MEQ/100ML IV SOLN
10.0000 meq | INTRAVENOUS | Status: DC
  Administered 2023-08-10: 10 meq via INTRAVENOUS
  Filled 2023-08-10 (×2): qty 100

## 2023-08-10 MED ORDER — SPIRONOLACTONE 25 MG PO TABS
50.0000 mg | ORAL_TABLET | Freq: Every day | ORAL | Status: DC
  Administered 2023-08-11 – 2023-08-13 (×3): 50 mg via ORAL
  Filled 2023-08-10 (×3): qty 2

## 2023-08-10 MED ORDER — POTASSIUM CHLORIDE CRYS ER 20 MEQ PO TBCR
40.0000 meq | EXTENDED_RELEASE_TABLET | Freq: Three times a day (TID) | ORAL | Status: DC
  Administered 2023-08-10: 40 meq via ORAL
  Filled 2023-08-10 (×2): qty 2

## 2023-08-10 MED ORDER — ISOSORBIDE MONONITRATE ER 30 MG PO TB24
30.0000 mg | ORAL_TABLET | Freq: Every day | ORAL | Status: DC
  Administered 2023-08-11 – 2023-08-13 (×3): 30 mg via ORAL
  Filled 2023-08-10 (×3): qty 1

## 2023-08-10 MED ORDER — LISINOPRIL 10 MG PO TABS
20.0000 mg | ORAL_TABLET | Freq: Every day | ORAL | Status: DC
  Administered 2023-08-11 – 2023-08-13 (×3): 20 mg via ORAL
  Filled 2023-08-10 (×3): qty 2

## 2023-08-10 MED ORDER — FUROSEMIDE 10 MG/ML IJ SOLN
80.0000 mg | Freq: Once | INTRAMUSCULAR | Status: AC
  Administered 2023-08-10: 80 mg via INTRAVENOUS
  Filled 2023-08-10: qty 8

## 2023-08-10 MED ORDER — ENOXAPARIN SODIUM 40 MG/0.4ML IJ SOSY: 40.0000 mg | PREFILLED_SYRINGE | INTRAMUSCULAR | Status: DC

## 2023-08-10 MED ORDER — APIXABAN 5 MG PO TABS
5.0000 mg | ORAL_TABLET | Freq: Two times a day (BID) | ORAL | Status: DC
  Administered 2023-08-10 – 2023-08-13 (×6): 5 mg via ORAL
  Filled 2023-08-10 (×6): qty 1

## 2023-08-10 MED ORDER — FUROSEMIDE 10 MG/ML IJ SOLN
80.0000 mg | Freq: Two times a day (BID) | INTRAMUSCULAR | Status: DC
  Administered 2023-08-11 – 2023-08-13 (×5): 80 mg via INTRAVENOUS
  Filled 2023-08-10 (×5): qty 8

## 2023-08-10 MED ORDER — LABETALOL HCL 200 MG PO TABS
400.0000 mg | ORAL_TABLET | Freq: Two times a day (BID) | ORAL | Status: DC
  Administered 2023-08-10 – 2023-08-13 (×6): 400 mg via ORAL
  Filled 2023-08-10 (×6): qty 2

## 2023-08-10 MED ORDER — GABAPENTIN 400 MG PO CAPS
400.0000 mg | ORAL_CAPSULE | Freq: Three times a day (TID) | ORAL | Status: DC
  Administered 2023-08-10 – 2023-08-13 (×9): 400 mg via ORAL
  Filled 2023-08-10 (×9): qty 1

## 2023-08-10 MED ORDER — POTASSIUM CHLORIDE CRYS ER 20 MEQ PO TBCR
40.0000 meq | EXTENDED_RELEASE_TABLET | Freq: Three times a day (TID) | ORAL | Status: DC
  Filled 2023-08-10: qty 2

## 2023-08-10 MED ORDER — DILTIAZEM HCL ER COATED BEADS 240 MG PO CP24
240.0000 mg | ORAL_CAPSULE | Freq: Every day | ORAL | Status: DC
  Administered 2023-08-11 – 2023-08-13 (×3): 240 mg via ORAL
  Filled 2023-08-10 (×3): qty 1

## 2023-08-10 MED ORDER — ASPIRIN 81 MG PO TBEC
81.0000 mg | DELAYED_RELEASE_TABLET | Freq: Every day | ORAL | Status: DC
  Administered 2023-08-10 – 2023-08-13 (×4): 81 mg via ORAL
  Filled 2023-08-10 (×4): qty 1

## 2023-08-10 MED ORDER — POTASSIUM CHLORIDE 10 MEQ/100ML IV SOLN
10.0000 meq | INTRAVENOUS | Status: AC
  Administered 2023-08-10 (×2): 10 meq via INTRAVENOUS
  Filled 2023-08-10: qty 100

## 2023-08-10 MED ORDER — FUROSEMIDE 10 MG/ML IJ SOLN
80.0000 mg | Freq: Two times a day (BID) | INTRAMUSCULAR | Status: DC
  Filled 2023-08-10: qty 8

## 2023-08-10 MED ORDER — HYDRALAZINE HCL 25 MG PO TABS
50.0000 mg | ORAL_TABLET | Freq: Three times a day (TID) | ORAL | Status: DC
  Administered 2023-08-10 – 2023-08-13 (×9): 50 mg via ORAL
  Filled 2023-08-10 (×10): qty 2

## 2023-08-10 NOTE — ED Triage Notes (Signed)
Pt reports shortness of breath with chest tightness and lower leg edema x 2 weeks.

## 2023-08-10 NOTE — Progress Notes (Signed)
Patient arrived to unit, noted patients blood pressure elevated 158/100. MD Adrian Blackwater made aware. Patient given scheduled medications as ordered. Tolerated medications whole with no complaints.

## 2023-08-10 NOTE — ED Provider Notes (Signed)
[place] EMERGENCY DEPARTMENT AT [facility] Provider Note   CSN: [id number] Arrival date & time: 08/10/23  1324     History  Chief Complaint  Patient presents with   Shortness of Breath    Connor [name] is a 67 y.o. male with history of hypertension, abdominal aortic aneurysm, CAD s/p CABG, left AKA, CKD, persistent A-fib on Eliquis, diastolic heart failure (LVEF 60 to 65%, echo 2022) who presents the emergency department complaining of shortness of breath, chest tightness, bilateral lower leg edema for the past 2 weeks.  Patient normally on 40 mg Lasix p.o. daily, but has been taking a double dose for the past 6 days.  States that he is not urinating as much as he normally is.  He is been getting very winded, even with bending over to tie his shoes.  He is been sleeping in his wheelchair as they currently have a mouse in his house including in his bedroom. He has not smoked in the past 3 weeks.    Shortness of Breath      Home Medications Prior to Admission medications   Medication Sig Start Date End Date Taking? Authorizing Provider  DILT-XR 240 MG 24 hr capsule Take 240 mg by mouth every morning. 07/15/23  Yes [provider]  acetaminophen (TYLENOL) 325 MG tablet Take 2 tablets (650 mg total) by mouth every 6 (six) hours as needed for moderate pain. 10/10/20   Arrien, York Ram, MD  aspirin EC 81 MG tablet Take 81 mg by mouth daily. Swallow whole.    [provider]  diltiazem (CARDIZEM CD) 240 MG 24 hr capsule TAKE 1 CAPSULE (240 MG TOTAL) BY MOUTH DAILY. 09/29/20 05/29/22  Angiulli, Mcarthur Rossetti, PA-C  ELIQUIS 5 MG TABS tablet TAKE 1 TABLET (5 MG TOTAL) BY MOUTH 2 (TWO) TIMES DAILY. 09/29/20 05/29/22  Angiulli, Mcarthur Rossetti, PA-C  furosemide (LASIX) 40 MG tablet Take 1 tablet (40 mg total) by mouth daily. If weight increases 3 lbs overnight or 5 lbs in 1 week, take additional 40 mg 11/10/21   Little Ishikawa, MD  gabapentin (NEURONTIN) 400 MG  capsule TAKE 1 CAPSULE (400 MG TOTAL) BY MOUTH THREE TIMES DAILY. 09/29/20 05/29/22  Angiulli, Mcarthur Rossetti, PA-C  hydrALAZINE (APRESOLINE) 50 MG tablet Take 1 tablet by mouth in the morning, at noon, and at bedtime.    [provider]  isosorbide mononitrate (IMDUR) 30 MG 24 hr tablet Take 1 tablet (30 mg total) by mouth daily. 12/10/20   Vassie Loll, MD  labetalol (NORMODYNE) 200 MG tablet TAKE 2 TABLETS (400 MG TOTAL) BY MOUTH TWO TIMES DAILY. 09/29/20 05/29/22  Angiulli, Mcarthur Rossetti, PA-C  lisinopril (ZESTRIL) 20 MG tablet Take 20 mg by mouth daily.    [provider]  Multiple Vitamins-Minerals (MULTIVITAMIN WITH MINERALS) tablet Take 1 tablet by mouth daily. Men    [provider]  potassium chloride SA (KLOR-CON M) 20 MEQ tablet Take 20 mEq by mouth daily.    [provider]  spironolactone (ALDACTONE) 50 MG tablet Take 1 tablet (50 mg total) by mouth daily. 08/15/21   Little Ishikawa, MD  traZODone (DESYREL) 100 MG tablet Take 100 mg by mouth at bedtime. 11/28/20   [provider]      Allergies    Patient has no known allergies.    Review of Systems   Review of Systems  Respiratory:  Positive for chest tightness and shortness of breath.   Cardiovascular:  Positive for  leg swelling.  All other systems reviewed and are negative.   Physical Exam Updated Vital Signs BP (!) 179/112 (BP Location: Right Arm)   Pulse 90   Temp 97.6 F (36.4 C) (Oral)   Resp 20   Wt 112 kg   SpO2 97%   BMI 35.44 kg/m  Physical Exam Vitals and nursing note reviewed.  Constitutional:      Appearance: Normal appearance.  HENT:     Head: Normocephalic and atraumatic.  Eyes:     Conjunctiva/sclera: Conjunctivae normal.  Cardiovascular:     Rate and Rhythm: Normal rate and regular rhythm.  Pulmonary:     Effort: Pulmonary effort is normal. No respiratory distress.     Breath sounds: Decreased air movement present. Wheezing present.  Abdominal:      General: There is no distension.     Palpations: Abdomen is soft.     Tenderness: There is no abdominal tenderness.  Musculoskeletal:     Right lower leg: 3+ Pitting Edema present.     Left lower leg: 3+ Pitting Edema present.  Skin:    General: Skin is warm and dry.     Comments: Chronic skin changes with skin thickening, edematous, weeping. L AKA  Neurological:     General: No focal deficit present.     Mental Status: He is alert.     ED Results / Procedures / Treatments   Labs (all labs ordered are listed, but only abnormal results are displayed) Labs Reviewed  BASIC METABOLIC PANEL - Abnormal; Notable for the following components:      Result Value   Potassium 2.9 (*)    Glucose, Bld 123 (*)    Calcium 8.7 (*)    All other components within normal limits  CBC - Abnormal; Notable for the following components:   RBC 3.27 (*)    Hemoglobin 9.6 (*)    HCT 31.3 (*)    All other components within normal limits  BRAIN NATRIURETIC PEPTIDE - Abnormal; Notable for the following components:   B Natriuretic Peptide 147.0 (*)    All other components within normal limits  TROPONIN I (HIGH SENSITIVITY) - Abnormal; Notable for the following components:   Troponin I (High Sensitivity) 32 (*)    All other components within normal limits  TROPONIN I (HIGH SENSITIVITY) - Abnormal; Notable for the following components:   Troponin I (High Sensitivity) 31 (*)    All other components within normal limits  MAGNESIUM  URINALYSIS, ROUTINE W REFLEX MICROSCOPIC  HEPATIC FUNCTION PANEL    EKG EKG Interpretation Date/Time:  Saturday August 10 2023 13:34:48 EST Ventricular Rate:  82 PR Interval:    QRS Duration:  138 QT Interval:  445 QTC Calculation: 520 R Axis:   -75  Text Interpretation: Atrial fibrillation RBBB and LAFB LVH with secondary repolarization abnormality ST elevation suggests acute pericarditis nonspecific ischemic changes compared with prior 6/22 Confirmed by Meridee Score [id number]) on 08/10/2023 1:43:11 PM  Radiology DG Chest 2 View Result Date: 08/10/2023 CLINICAL DATA:  Shortness of breath EXAM: CHEST - 2 VIEW COMPARISON:  01/23/2023 FINDINGS: Stable cardiomegaly status post sternotomy. Aortic stent graft. Pulmonary vascular congestion. Diffuse bilateral interstitial opacities. No pleural effusion or pneumothorax. IMPRESSION: Cardiomegaly with pulmonary vascular congestion and diffuse bilateral interstitial opacities, likely edema. Electronically Signed   By: Duanne Guess D.O.   On: 08/10/2023 15:29    Procedures Procedures    Medications Ordered in ED Medications  potassium chloride 10 mEq in 100 mL  IVPB (10 mEq Intravenous New Bag/Given 08/10/23 1609)  potassium chloride SA (KLOR-CON M) CR tablet 40 mEq (40 mEq Oral Given 08/10/23 1554)  furosemide (LASIX) injection 80 mg (80 mg Intravenous Given 08/10/23 1556)    ED Course/ Medical Decision Making/ A&P Clinical Course as of 08/10/23 1630  Sat Aug 10, 2023  2425 67 year old male with history of vascular disease CHF CKD here with worsening leg swelling and shortness of breath in the setting of increasing his diuretics.  He is fluid overloaded on exam.  Getting labs and imaging.  Likely will need admission for IV diuresis. [MB]    Clinical Course User Index [MB] Terrilee Files, MD                                 Medical Decision Making Amount and/or Complexity of Data Reviewed Labs: ordered. Radiology: ordered.   This patient is a 67 y.o. male  who presents to the ED for concern of shortness of breath, chest tightness, and lower leg edema x 2 weeks.   Differential diagnoses prior to evaluation: The emergent differential diagnosis includes, but is not limited to,  CHF, pericardial effusion/tamponade, arrhythmias, ACS, COPD, asthma, bronchitis, pneumonia, pneumothorax, PE, anemia. This is not an exhaustive differential.   Past Medical History / Co-morbidities / Social  History: hypertension, abdominal aortic aneurysm, CAD s/p CABG, left AKA, CKD, persistent A-fib on Eliquis, diastolic heart failure (LVEF 60 to 65%, echo 2022)  Additional history: Chart reviewed. 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 Follows with Dr Bjorn Pippin with cardiology, most recent visit on 10/17. Recommended continue eliquis and cardizem.   Physical Exam: Physical exam performed. The pertinent findings include: Hypertensive, lung sounds diminished. 3+ pitting edema with skin weeping.   Lab Tests/Imaging studies: I personally interpreted labs/imaging and the pertinent results include: CBC at baseline.  Potassium 2.9, otherwise BMP normal.  Normal magnesium.  BNP 147.  Initial troponin 32, delta troponin 31.  UA pending.  Chest x-ray with cardiomegaly, pulmonary vascular congestion, diffuse bilateral interstitial opacities. I agree with the radiologist interpretation.  Cardiac monitoring: EKG obtained and interpreted by myself and attending physician which shows: atrial fibrillation, RBBB, diffuse ST elevation, nonspecific ischemic changes compared with prior 6/22   Medications: I ordered medication including potassium replacement and Lasix.  I have reviewed the patients home medicines and have made adjustments as needed.  Consultations obtained: I consulted with hospitalist Dr Adrian Blackwater who will admit.    Disposition: After consideration of the diagnostic results and the patients response to treatment, I feel that patient is requiring admission for IV diuresis and electrolyte replacement in the setting of insufficient urination on PO diuretics despite dosage increase.  Final Clinical Impression(s) / ED Diagnoses Final diagnoses:  Shortness of breath  Hypervolemia, unspecified hypervolemia type  Hypokalemia    Rx / DC Orders ED Discharge  Orders     None      Portions of this report may have been transcribed using voice recognition software. Every effort was made to ensure accuracy; however, inadvertent computerized transcription errors may be present.    Jeanella Flattery 08/10/23 1630    Terrilee Files, MD 08/10/23 [phone]

## 2023-08-10 NOTE — ED Notes (Addendum)
ED TO INPATIENT HANDOFF REPORT  ED Nurse Name and Phone #:Connor [name] Alain Honey Name/Age/Gender Connor [name] 67 y.o. male Room/Bed: APA19/APA19  Code Status   Code Status: Prior  Home/SNF/Other Home Patient oriented to: self, place, time, and situation Is this baseline? Yes   Triage Complete: Triage complete  Chief Complaint Acute CHF (congestive heart failure) (HCC) [I50.9]  Triage Note Pt reports shortness of breath with chest tightness and lower leg edema x 2 weeks.   Allergies No Known Allergies  Level of Care/Admitting Diagnosis ED Disposition     ED Disposition  Admit   Condition  --   Comment  Hospital Area: [facility] [[id number]]  Level of Care: Telemetry [5]  Covid Evaluation: Asymptomatic - no recent exposure (last 10 days) testing not required  Diagnosis: Acute CHF (congestive heart failure) [facility]) [[id number]]  Admitting Physician: Levie Heritage [[id number]]  Attending Physician: Levie Heritage [[id number]]  Certification:: I certify this patient will need inpatient services for at least 2 midnights  Expected Medical Readiness: 08/13/2023          B Medical/Surgery History Past Medical History:  Diagnosis Date   AAA (abdominal aortic aneurysm) (HCC)    History of open heart surgery    Hypertension    Seroma due to trauma [facility])    Past Surgical History:  Procedure Laterality Date   ABDOMINAL AORTIC ANEURYSM REPAIR     AMPUTATION Left 09/07/2020   Procedure: ATTEMPTED LEFT LEG DEBRIDEMENT FASCIOTOMIES, APPLY INSTILLATION WOUND VAC, ABOVE KNEE AMPUTATION;  Surgeon: Nadara Mustard, MD;  Location: [facility] OR;  Service: Orthopedics;  Laterality: Left;   BUBBLE STUDY  09/05/2020   Procedure: BUBBLE STUDY;  Surgeon: Meriam Sprague, MD;  Location: [facility] ENDOSCOPY;  Service: Cardiovascular;;   CARDIOVERSION N/A 02/11/2020   Procedure: CARDIOVERSION;  Surgeon: Little Ishikawa, MD;  Location: [facility] ENDOSCOPY;  Service: Cardiovascular;  Laterality: N/A;    CORONARY ARTERY BYPASS GRAFT     STUMP REVISION Left 09/09/2020   Procedure: REVISION LEFT ABOVE KNEE AMPUTATION;  Surgeon: Nadara Mustard, MD;  Location: [facility] OR;  Service: Orthopedics;  Laterality: Left;   TEE WITHOUT CARDIOVERSION N/A 09/05/2020   Procedure: TRANSESOPHAGEAL ECHOCARDIOGRAM (TEE);  Surgeon: Meriam Sprague, MD;  Location: [facility] ENDOSCOPY;  Service: Cardiovascular;  Laterality: N/A;     A IV Location/Drains/Wounds Patient Lines/Drains/Airways Status     Active Line/Drains/Airways     Name Placement date Placement time Site Days   Peripheral IV 08/10/23 20 G 1.88" Anterior;Right Forearm 08/10/23  1426  Forearm  less than 1            Intake/Output Last 24 hours No intake or output data in the 24 hours ending 08/10/23 1639  Labs/Imaging Results for orders placed or performed during the hospital encounter of 08/10/23 (from the past 48 hours)  Basic metabolic panel     Status: Abnormal   Collection Time: 08/10/23  2:27 PM  Result Value Ref Range   Sodium 139 135 - 145 mmol/L   Potassium 2.9 (L) 3.5 - 5.1 mmol/L   Chloride 102 98 - 111 mmol/L   CO2 28 22 - 32 mmol/L   Glucose, Bld 123 (H) 70 - 99 mg/dL    Comment: Glucose reference range applies only to samples taken after fasting for at least 8 hours.   BUN 11 8 - 23 mg/dL   Creatinine, Ser 0.45 0.61 - 1.24 mg/dL   Calcium 8.7 (L) 8.9 - 10.3 mg/dL  GFR, Estimated >60 >60 mL/min    Comment: (NOTE) Calculated using the CKD-EPI Creatinine Equation (2021)    Anion gap 9 5 - 15    Comment: Performed at [facility], [address] [place], [place] [address]  CBC     Status: Abnormal   Collection Time: 08/10/23  2:27 PM  Result Value Ref Range   WBC 4.1 4.0 - 10.5 K/uL   RBC 3.27 (L) 4.22 - 5.81 MIL/uL   Hemoglobin 9.6 (L) 13.0 - 17.0 g/dL   HCT 19.1 (L) 47.8 - 29.5 %   MCV 95.7 80.0 - 100.0 fL   MCH 29.4 26.0 - 34.0 pg   MCHC 30.7 30.0 - 36.0 g/dL   RDW 62.1 30.8 - 65.7 %   Platelets 150 150 - 400 K/uL    nRBC 0.0 0.0 - 0.2 %    Comment: Performed at [facility], [address], [place], [place] [address]  Brain natriuretic peptide     Status: Abnormal   Collection Time: 08/10/23  2:27 PM  Result Value Ref Range   B Natriuretic Peptide 147.0 (H) 0.0 - 100.0 pg/mL    Comment: Performed at [facility], [address] [place], [place] [address]  Magnesium     Status: None   Collection Time: 08/10/23  2:27 PM  Result Value Ref Range   Magnesium 1.8 1.7 - 2.4 mg/dL    Comment: Performed at [facility], [address] [place], [place] [address]  Troponin I (High Sensitivity)     Status: Abnormal   Collection Time: 08/10/23  2:27 PM  Result Value Ref Range   Troponin I (High Sensitivity) 32 (H) <18 ng/L    Comment: (NOTE) Elevated high sensitivity troponin I (hsTnI) values and significant  changes across serial measurements may suggest ACS but many other  chronic and acute conditions are known to elevate hsTnI results.  Refer to the "Links" section for chest pain algorithms and additional  guidance. Performed at [facility], [address], [place], [place] [address]   Troponin I (High Sensitivity)     Status: Abnormal   Collection Time: 08/10/23  3:33 PM  Result Value Ref Range   Troponin I (High Sensitivity) 31 (H) <18 ng/L    Comment: (NOTE) Elevated high sensitivity troponin I (hsTnI) values and significant  changes across serial measurements may suggest ACS but many other  chronic and acute conditions are known to elevate hsTnI results.  Refer to the "Links" section for chest pain algorithms and additional  guidance. Performed at [facility] [address], [place], [place] [address]    DG Chest 2 View Result Date: 08/10/2023 CLINICAL DATA:  Shortness of breath EXAM: CHEST - 2 VIEW COMPARISON:  01/23/2023 FINDINGS: Stable cardiomegaly status post sternotomy. Aortic stent graft. Pulmonary vascular congestion. Diffuse bilateral interstitial opacities. No pleural effusion or  pneumothorax. IMPRESSION: Cardiomegaly with pulmonary vascular congestion and diffuse bilateral interstitial opacities, likely edema. Electronically Signed   By: Duanne Guess D.O.   On: 08/10/2023 15:29    Pending Labs Unresulted Labs (From admission, onward)     Start     Ordered   08/10/23 1625  Hepatic function panel  Add-on,   AD        08/10/23 1624   08/10/23 1342  Urinalysis, Routine w reflex microscopic -Urine, Clean Catch  Once,   URGENT       Question:  Specimen Source  Answer:  Urine, Clean Catch   08/10/23 1341  Vitals/Pain Today's Vitals   08/10/23 1331 08/10/23 1335  BP: (!) 179/112   Pulse: 90   Resp: 20   Temp: 97.6 F (36.4 C)   TempSrc: Oral   SpO2: 97%   Weight:  112 kg  PainSc:  0-No pain    Isolation Precautions No active isolations  Medications Medications  potassium chloride 10 mEq in 100 mL IVPB (10 mEq Intravenous New Bag/Given 08/10/23 1609)  potassium chloride SA (KLOR-CON M) CR tablet 40 mEq (40 mEq Oral Given 08/10/23 1554)  furosemide (LASIX) injection 80 mg (80 mg Intravenous Given 08/10/23 1556)    Mobility walks with device      R Recommendations: See Admitting Provider Note  Report given to: Delice Bison

## 2023-08-10 NOTE — H&P (Signed)
History and Physical    Patient: Connor [name] JXB:[id number] DOB: 1955/12/21 DOA: 08/10/2023 DOS: the patient was seen and examined on 08/10/2023 PCP: Oneal Grout, FNP  Patient coming from: Home  Chief Complaint:  Chief Complaint  Patient presents with   Shortness of Breath   HPI: Connor [name] is a 67 y.o. male with medical history significant of A-fib on Eliquis, hypertension, heart failure, history of left AKA, peripheral vascular disease.  Patient is currently on 40 mg of Lasix daily although has doubled his Lasix over the last 5 days due to increasing weight and shortness of breath.  This has not been helpful and he has continued to have increasing shortness of breath.  Due to his symptoms, he presents to the hospital for evaluation.  He denies fevers, chills, nausea, vomiting.  He does have orthopnea.  Review of Systems: As mentioned in the history of present illness. All other systems reviewed and are negative. Past Medical History:  Diagnosis Date   AAA (abdominal aortic aneurysm) (HCC)    History of open heart surgery    Hypertension    Seroma due to trauma [facility])    Past Surgical History:  Procedure Laterality Date   ABDOMINAL AORTIC ANEURYSM REPAIR     AMPUTATION Left 09/07/2020   Procedure: ATTEMPTED LEFT LEG DEBRIDEMENT FASCIOTOMIES, APPLY INSTILLATION WOUND VAC, ABOVE KNEE AMPUTATION;  Surgeon: Nadara Mustard, MD;  Location: [facility] OR;  Service: Orthopedics;  Laterality: Left;   BUBBLE STUDY  09/05/2020   Procedure: BUBBLE STUDY;  Surgeon: Meriam Sprague, MD;  Location: [facility] ENDOSCOPY;  Service: Cardiovascular;;   CARDIOVERSION N/A 02/11/2020   Procedure: CARDIOVERSION;  Surgeon: Little Ishikawa, MD;  Location: [facility] ENDOSCOPY;  Service: Cardiovascular;  Laterality: N/A;   CORONARY ARTERY BYPASS GRAFT     STUMP REVISION Left 09/09/2020   Procedure: REVISION LEFT ABOVE KNEE AMPUTATION;  Surgeon: Nadara Mustard, MD;  Location: [facility] OR;  Service: Orthopedics;   Laterality: Left;   TEE WITHOUT CARDIOVERSION N/A 09/05/2020   Procedure: TRANSESOPHAGEAL ECHOCARDIOGRAM (TEE);  Surgeon: Meriam Sprague, MD;  Location: [facility] ENDOSCOPY;  Service: Cardiovascular;  Laterality: N/A;   Social History:  reports that he quit smoking about 2 weeks ago. His smoking use included cigarettes. He has never used smokeless tobacco. He reports that he does not currently use alcohol. He reports that he does not use drugs.  No Known Allergies  Family History  Problem Relation Age of Onset   CAD Mother    CVA Father     Prior to Admission medications   Medication Sig Start Date End Date Taking? Authorizing Provider  acetaminophen (TYLENOL) 325 MG tablet Take 2 tablets (650 mg total) by mouth every 6 (six) hours as needed for moderate pain. 10/10/20  Yes Arrien, York Ram, MD  aspirin EC 81 MG tablet Take 81 mg by mouth daily. Swallow whole.   Yes [provider]  diltiazem (CARDIZEM CD) 240 MG 24 hr capsule TAKE 1 CAPSULE (240 MG TOTAL) BY MOUTH DAILY. 09/29/20 08/10/23 Yes Angiulli, Mcarthur Rossetti, PA-C  ELIQUIS 5 MG TABS tablet TAKE 1 TABLET (5 MG TOTAL) BY MOUTH 2 (TWO) TIMES DAILY. 09/29/20 08/10/23 Yes Angiulli, Mcarthur Rossetti, PA-C  furosemide (LASIX) 40 MG tablet Take 1 tablet (40 mg total) by mouth daily. If weight increases 3 lbs overnight or 5 lbs in 1 week, take additional 40 mg 11/10/21  Yes Little Ishikawa, MD  gabapentin (NEURONTIN) 400 MG capsule TAKE 1 CAPSULE (400 MG TOTAL)  BY MOUTH THREE TIMES DAILY. 09/29/20 08/10/23 Yes Angiulli, Mcarthur Rossetti, PA-C  hydrALAZINE (APRESOLINE) 50 MG tablet Take 1 tablet by mouth in the morning, at noon, and at bedtime.   Yes [provider]  isosorbide mononitrate (IMDUR) 30 MG 24 hr tablet Take 1 tablet (30 mg total) by mouth daily. 12/10/20  Yes Vassie Loll, MD  labetalol (NORMODYNE) 200 MG tablet TAKE 2 TABLETS (400 MG TOTAL) BY MOUTH TWO TIMES DAILY. 09/29/20 08/10/23 Yes Angiulli, Mcarthur Rossetti, PA-C  lisinopril  (ZESTRIL) 20 MG tablet Take 20 mg by mouth daily.   Yes [provider]  Multiple Vitamins-Minerals (MULTIVITAMIN WITH MINERALS) tablet Take 1 tablet by mouth daily. Men   Yes [provider]  potassium chloride SA (KLOR-CON M) 20 MEQ tablet Take 20 mEq by mouth daily.   Yes [provider]  spironolactone (ALDACTONE) 50 MG tablet Take 1 tablet (50 mg total) by mouth daily. 08/15/21  Yes Little Ishikawa, MD  traZODone (DESYREL) 100 MG tablet Take 100 mg by mouth at bedtime. 11/28/20  Yes [provider]    Physical Exam: Vitals:   08/10/23 1331 08/10/23 1335  BP: (!) 179/112   Pulse: 90   Resp: 20   Temp: 97.6 F (36.4 C)   TempSrc: Oral   SpO2: 97%   Weight:  112 kg   General: Elderly male. Awake and alert and oriented x3. No acute cardiopulmonary distress.  HEENT: Normocephalic atraumatic.  Right and left ears normal in appearance.  Pupils equal, round, reactive to light. Extraocular muscles are intact. Sclerae anicteric and noninjected.  Moist mucosal membranes. No mucosal lesions.  Neck: Neck supple without lymphadenopathy. No carotid bruits. No masses palpated.  Cardiovascular: Irregularly irregular rate.  Elevated JVD.  2+ peripheral edema on the right leg.Marland Kitchen  Respiratory: Good respiratory effort with no wheezes, rales, rhonchi. Lungs clear to auscultation bilaterally.  No accessory muscle use. Abdomen: Soft, nontender, nondistended. Active bowel sounds. No masses or hepatosplenomegaly  Skin: No rashes, lesions, or ulcerations.  He does have fungal foot infection on the right foot.  No ulcerations or open wounds.  Dry, warm to touch. 2+ dorsalis pedis and radial pulses. Musculoskeletal: No calf or leg pain. All major joints not erythematous nontender.  No upper or lower joint deformation.  Good ROM.  No contractures  Psychiatric: Intact judgment and insight. Pleasant and cooperative. Neurologic: No focal neurological deficits. Strength is 5/5  and symmetric in upper and lower extremities.  Cranial nerves II through XII are grossly intact.  Data Reviewed: Results for orders placed or performed during the hospital encounter of 08/10/23 (from the past 24 hours)  Basic metabolic panel     Status: Abnormal   Collection Time: 08/10/23  2:27 PM  Result Value Ref Range   Sodium 139 135 - 145 mmol/L   Potassium 2.9 (L) 3.5 - 5.1 mmol/L   Chloride 102 98 - 111 mmol/L   CO2 28 22 - 32 mmol/L   Glucose, Bld 123 (H) 70 - 99 mg/dL   BUN 11 8 - 23 mg/dL   Creatinine, Ser 6.44 0.61 - 1.24 mg/dL   Calcium 8.7 (L) 8.9 - 10.3 mg/dL   GFR, Estimated >03 >47 mL/min   Anion gap 9 5 - 15  CBC     Status: Abnormal   Collection Time: 08/10/23  2:27 PM  Result Value Ref Range   WBC 4.1 4.0 - 10.5 K/uL   RBC 3.27 (L) 4.22 - 5.81 MIL/uL  Hemoglobin 9.6 (L) 13.0 - 17.0 g/dL   HCT 43.3 (L) 29.5 - 18.8 %   MCV 95.7 80.0 - 100.0 fL   MCH 29.4 26.0 - 34.0 pg   MCHC 30.7 30.0 - 36.0 g/dL   RDW 41.6 60.6 - 30.1 %   Platelets 150 150 - 400 K/uL   nRBC 0.0 0.0 - 0.2 %  Brain natriuretic peptide     Status: Abnormal   Collection Time: 08/10/23  2:27 PM  Result Value Ref Range   B Natriuretic Peptide 147.0 (H) 0.0 - 100.0 pg/mL  Magnesium     Status: None   Collection Time: 08/10/23  2:27 PM  Result Value Ref Range   Magnesium 1.8 1.7 - 2.4 mg/dL  Troponin I (High Sensitivity)     Status: Abnormal   Collection Time: 08/10/23  2:27 PM  Result Value Ref Range   Troponin I (High Sensitivity) 32 (H) <18 ng/L  Troponin I (High Sensitivity)     Status: Abnormal   Collection Time: 08/10/23  3:33 PM  Result Value Ref Range   Troponin I (High Sensitivity) 31 (H) <18 ng/L  Hepatic function panel     Status: None   Collection Time: 08/10/23  3:33 PM  Result Value Ref Range   Total Protein 7.0 6.5 - 8.1 g/dL   Albumin 3.5 3.5 - 5.0 g/dL   AST 19 15 - 41 U/L   ALT 14 0 - 44 U/L   Alkaline Phosphatase 73 38 - 126 U/L   Total Bilirubin 1.0 <1.2 mg/dL    Bilirubin, Direct 0.2 0.0 - 0.2 mg/dL   Indirect Bilirubin 0.8 0.3 - 0.9 mg/dL  Urinalysis, Routine w reflex microscopic -Urine, Clean Catch     Status: Abnormal   Collection Time: 08/10/23  4:50 PM  Result Value Ref Range   Color, Urine STRAW (A) YELLOW   APPearance CLEAR CLEAR   Specific Gravity, Urine 1.006 1.005 - 1.030   pH 7.0 5.0 - 8.0   Glucose, UA NEGATIVE NEGATIVE mg/dL   Hgb urine dipstick SMALL (A) NEGATIVE   Bilirubin Urine NEGATIVE NEGATIVE   Ketones, ur NEGATIVE NEGATIVE mg/dL   Protein, ur NEGATIVE NEGATIVE mg/dL   Nitrite NEGATIVE NEGATIVE   Leukocytes,Ua NEGATIVE NEGATIVE   RBC / HPF 0-5 0 - 5 RBC/hpf   WBC, UA 0-5 0 - 5 WBC/hpf   Bacteria, UA NONE SEEN NONE SEEN   Squamous Epithelial / HPF 0-5 0 - 5 /HPF   Mucus PRESENT     DG Chest 2 View Result Date: 08/10/2023 CLINICAL DATA:  Shortness of breath EXAM: CHEST - 2 VIEW COMPARISON:  01/23/2023 FINDINGS: Stable cardiomegaly status post sternotomy. Aortic stent graft. Pulmonary vascular congestion. Diffuse bilateral interstitial opacities. No pleural effusion or pneumothorax. IMPRESSION: Cardiomegaly with pulmonary vascular congestion and diffuse bilateral interstitial opacities, likely edema. Electronically Signed   By: Duanne Guess D.O.   On: 08/10/2023 15:29     Assessment and Plan: No notes have been filed under this hospital service. Service: Hospitalist  Principal Problem:   Acute CHF (congestive heart failure) (HCC) Active Problems:   Persistent atrial fibrillation (HCC)   Hypokalemia   CKD (chronic kidney disease), stage III (HCC)   History of left above knee amputation (HCC)   Essential hypertension   Acute on chronic diastolic CHF (congestive heart failure) (HCC)  Acute CHF Telemetry monitoring Strict I/O Daily Weights Diuresis: lasix 80mg  BID Potassium: 40 mEq three times a day by mouth Echo cardiac exam  tomorrow Repeat BMP tomorrow CKD Hypokalemia Potassium replacement Atrial  fibrillation Continue Eliquis Hypertension   Advance Care Planning:   Code Status: Full Code confirmed by patient  Consults: None  Family Communication:   Severity of Illness: The appropriate patient status for this patient is INPATIENT. Inpatient status is judged to be reasonable and necessary in order to provide the required intensity of service to ensure the patient's safety. The patient's presenting symptoms, physical exam findings, and initial radiographic and laboratory data in the context of their chronic comorbidities is felt to place them at high risk for further clinical deterioration. Furthermore, it is not anticipated that the patient will be medically stable for discharge from the hospital within 2 midnights of admission.   * I certify that at the point of admission it is my clinical judgment that the patient will require inpatient hospital care spanning beyond 2 midnights from the point of admission due to high intensity of service, high risk for further deterioration and high frequency of surveillance required.*  Author: Levie Heritage, DO 08/10/2023 5:15 PM  For on call review [email or web address].

## 2023-08-11 ENCOUNTER — Inpatient Hospital Stay (HOSPITAL_COMMUNITY): Payer: Medicare HMO

## 2023-08-11 DIAGNOSIS — N1831 Chronic kidney disease, stage 3a: Secondary | ICD-10-CM | POA: Diagnosis not present

## 2023-08-11 DIAGNOSIS — I5033 Acute on chronic diastolic (congestive) heart failure: Secondary | ICD-10-CM | POA: Diagnosis not present

## 2023-08-11 DIAGNOSIS — E66812 Obesity, class 2: Secondary | ICD-10-CM | POA: Diagnosis not present

## 2023-08-11 DIAGNOSIS — I4819 Other persistent atrial fibrillation: Secondary | ICD-10-CM

## 2023-08-11 LAB — BASIC METABOLIC PANEL
Anion gap: 11 (ref 5–15)
BUN: 14 mg/dL (ref 8–23)
CO2: 27 mmol/L (ref 22–32)
Calcium: 8.4 mg/dL — ABNORMAL LOW (ref 8.9–10.3)
Chloride: 104 mmol/L (ref 98–111)
Creatinine, Ser: 1.28 mg/dL — ABNORMAL HIGH (ref 0.61–1.24)
GFR, Estimated: >60 mL/min (ref >60)
Glucose, Bld: 95 mg/dL (ref 70–99)
Potassium: 3.5 mmol/L (ref 3.5–5.1)
Sodium: 142 mmol/L (ref 135–145)

## 2023-08-11 LAB — ECHOCARDIOGRAM COMPLETE
Area-P 1/2: 4.54 cm2
Height: 70 in
S' Lateral: 3.3 cm
Weight: 4007.08 [oz_av]

## 2023-08-11 MED ORDER — ACETAMINOPHEN 325 MG PO TABS
650.0000 mg | ORAL_TABLET | Freq: Four times a day (QID) | ORAL | Status: DC | PRN
  Administered 2023-08-11: 650 mg via ORAL
  Filled 2023-08-11: qty 2

## 2023-08-11 MED ORDER — POTASSIUM CHLORIDE CRYS ER 20 MEQ PO TBCR
40.0000 meq | EXTENDED_RELEASE_TABLET | Freq: Two times a day (BID) | ORAL | Status: DC
Start: 2023-08-11 — End: 2023-08-13
  Administered 2023-08-11 – 2023-08-12 (×4): 40 meq via ORAL
  Filled 2023-08-11 (×5): qty 2

## 2023-08-11 NOTE — Hospital Course (Addendum)
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  He states that the shortness of breath has progressively worsened, particularly over the past 2 to 3 days prior to admission.  He states that he drinks at least 6 x 20 ounces of fluid daily.   the patient states that he normally takes furosemide 40 mg daily, but has been doubling his dose for the past 6 days without much improvement.  He last weighed himself on 08/05/2023 at home, and it was 247 pounds.  The patient has had to sleep in a wheelchair because of shortness of breath.  He has a history of tobacco but has not smoked in 3 weeks.  In the ED, the patient had low-grade temperature of 99.0 F.  He was hemodynamically stable with oxygen saturation 94% room air.  WBC 139, potassium 2.9, bicarbonate 28, serum creatinine 1.22 WBC 4.1, hemoglobin 9.6, platelets 150.  BNP 147.  Troponin 32>> 31.  Chest x-ray showed vascular congestion with increased interstitial markings..  The patient was started IV furosemide.

## 2023-08-11 NOTE — Progress Notes (Signed)
   08/11/23 0800  ReDS Vest / Clip  Station Marker D  Ruler Value 41  ReDS Value Range (!) > 40  ReDS Actual Value 44

## 2023-08-11 NOTE — TOC Initial Note (Signed)
Transition of Care [facility]) - Initial/Assessment Note    Patient Details  Name: Connor [name] MRN: [id number] Date of Birth: 14-Aug-1956  Transition of Care [facility]) CM/SW Contact:    Villa Herb, LCSWA Phone Number: 08/11/2023, 11:14 AM  Clinical Narrative:                 [facility] consulted for CHF screen. CSW spoke with pt to complete assessment. Pt lives with family. Pt has transportation to appointments. Pt has had HH in the past. Pt has a walker and a wheelchair to use in the home. Pt states that he takes medications daily and tries to follow a heart healthy diet. Pt states that he weighs every other day. TOC to follow.   Expected Discharge Plan: Home/Self Care Barriers to Discharge: Continued Medical Work up   Patient Goals and CMS Choice Patient states their goals for this hospitalization and ongoing recovery are:: return home CMS Medicare.gov Compare Post Acute Care list provided to:: Patient Choice offered to / list presented to : Patient      Expected Discharge Plan and Services In-house Referral: Clinical Social Work Discharge Planning Services: CM Consult   Living arrangements for the past 2 months: Single Family Home                                      Prior Living Arrangements/Services Living arrangements for the past 2 months: Single Family Home Lives with:: Relatives Patient language and need for interpreter reviewed:: Yes Do you feel safe going back to the place where you live?: Yes      Need for Family Participation in Patient Care: Yes (Comment) Care giver support system in place?: Yes (comment) Current home services: DME Criminal Activity/Legal Involvement Pertinent to Current Situation/Hospitalization: No - Comment as needed  Activities of Daily Living   ADL Screening (condition at time of admission) Independently performs ADLs?: Yes (appropriate for developmental age) Is the patient deaf or have difficulty hearing?: No Does the patient have  difficulty seeing, even when wearing glasses/contacts?: No Does the patient have difficulty concentrating, remembering, or making decisions?: No  Permission Sought/Granted                  Emotional Assessment Appearance:: Appears stated age Attitude/Demeanor/Rapport: Engaged Affect (typically observed): Accepting Orientation: : Oriented to Self, Oriented to Place, Oriented to  Time, Oriented to Situation Alcohol / Substance Use: Not Applicable Psych Involvement: No (comment)  Admission diagnosis:  Shortness of breath [R06.02] Hypokalemia [E87.6] Acute CHF (congestive heart failure) (HCC) [I50.9] Hypervolemia, unspecified hypervolemia type [E87.70] Patient Active Problem List   Diagnosis Date Noted   Acute on chronic heart failure with preserved ejection fraction (HFpEF) (HCC) 08/11/2023   Chronic kidney disease, stage 3a (HCC) 08/11/2023   Class 2 obesity 08/11/2023   Acute CHF (congestive heart failure) (HCC) 08/10/2023   Acute pulmonary edema (HCC)    Acute on chronic diastolic (congestive) heart failure (HCC) 12/08/2020   Acute on chronic diastolic CHF (congestive heart failure) (HCC) 12/07/2020   Paroxysmal A-fib (HCC) 10/10/2020   Gastritis 10/10/2020   Dyspepsia 10/07/2020   CKD stage G2/A2, GFR 60-89 and albumin creatinine ratio 30-299 mg/g 10/07/2020   Atypical chest pain 10/06/2020   Acute blood loss anemia    Essential hypertension    Postoperative pain    Severe anemia 09/19/2020   History of left above knee amputation (HCC) 09/19/2020  Left above-knee amputee (HCC) 09/19/2020   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)    CKD (chronic kidney disease), stage III (HCC) 09/01/2020   Morbid obesity (HCC) 09/01/2020   Severe sepsis with acute organ dysfunction due to Streptococcus species (HCC) 08/30/2020   Cellulitis of left leg 08/30/2020   Lactic acidosis 08/30/2020   Elevated troponin 08/30/2020   Hypoalbuminemia 08/30/2020    Leukocytosis 08/30/2020   Thrombocytopenia (HCC) 08/30/2020   Hypokalemia 08/30/2020   Hyperglycemia 08/30/2020   Transaminitis 08/30/2020   Total bilirubin, elevated 08/30/2020   AKI (acute kidney injury) (HCC) 08/30/2020   Nausea vomiting and diarrhea 08/30/2020   Hypertension 05/20/2020   Persistent atrial fibrillation (HCC)    PCP:  Oneal Grout, FNP Pharmacy:   [facility] - [place], [place] - [address] [place] [address] Phone: [phone] Fax: [phone]  [facility] [place] - [place], [place] - [address] [place] [address] Phone: [phone] Fax: [phone]     Social Drivers of Health (SDOH) Social History: SDOH Screenings   Food Insecurity: No Food Insecurity (08/10/2023)  Housing: Low Risk  (08/10/2023)  Transportation Needs: No Transportation Needs (08/10/2023)  Utilities: At Risk (08/10/2023)  Financial Resource Strain: Low Risk  (02/06/2021)   Received from [facility], [facility]  Tobacco Use: Medium Risk (08/10/2023)   SDOH Interventions:     Readmission Risk Interventions     No data to display

## 2023-08-11 NOTE — Plan of Care (Signed)

## 2023-08-11 NOTE — Progress Notes (Signed)
  Echocardiogram 2D Echocardiogram has been performed.  Leda Roys RDCS 08/11/2023, 11:27 AM

## 2023-08-11 NOTE — Progress Notes (Signed)
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  He states that the shortness of breath has progressively worsened, particularly over the past 2 to 3 days prior to admission.  He states that he drinks at least 6 x 20 ounces of fluid daily.   the patient states that he normally takes furosemide 40 mg daily, but has been doubling his dose for the past 6 days without much improvement.  He last weighed himself on 08/05/2023 at home, and it was 247 pounds.  The patient has had to sleep in a wheelchair because of shortness of breath.  He has a history of tobacco but has not smoked in 3 weeks.  In the ED, the patient had low-grade temperature of 99.0 F.  He was hemodynamically stable with oxygen saturation 94% room air.  WBC 139, potassium 2.9, bicarbonate 28, serum creatinine 1.22 WBC 4.1, hemoglobin 9.6, platelets 150.  BNP 147.  Troponin 32>> 31.  Chest x-ray showed vascular congestion with increased interstitial markings..  The patient was started IV furosemide.   Assessment/Plan: Acute on chronic HFpEF -Presenting weight 254 pounds -Remains clinically fluid overloaded -Continue IV furosemide -Daily weights -Accurate I's and O's -09/05/2020 echo EF 60 to 65%, trivial MR/AI, normal RVF -Repeat echo -12/15 ReDS = 44  CKD stage IIIa -Baseline creatinine 1.2-1.5 -Monitor with diuresis  Essential hypertension -Continue diltiazem, hydralazine, spironolactone, Imdur -Anticipate improvement with IV diuresis  Elevated troponin -Secondary to demand ischemia in the setting of  decompensated CHF -Troponin 32>> 31  Persistent atrial fibrillation -DCCV 01/2020 -Subsequently went back into atrial fibrillation -Continue diltiazem -Continue apixaban  OSA -Continue CPAP  History of thoracic aortic dissection -underwent repair for type a aortic dissection in 2016  -02/06/2021 he had TEVAR extension and left renal artery stenting [facility] -Follow-up vascular surgery  Status post left AKA -Patient had necrotizing fasciitis 08/2020  Class II obesity -BMI 36.47 -Lifestyle modification  Hypokalemia -Replete -Check magnesium  Anemia of chronic disease -Baseline hemoglobin~11 -Check anemia panel  Peripheral vascular disease -underwent left common and external iliac artery and left renal vein stents on 09/19/2018 in preparation for total aortic arch repair         Family Communication:   cousin at bedside 12/15  Consultants:  none  Code Status:  FULL   DVT Prophylaxis: apixaban   Procedures: As Listed in Progress Note Above  Antibiotics: None        Subjective: Patient denies fevers, chills, headache, chest pain, dyspnea, nausea, vomiting, diarrhea, abdominal pain, dysuria, hematuria, hematochezia, and melena.   Objective: Vitals:   08/10/23 2133 08/11/23 0121 08/11/23 0515 08/11/23 0522  BP: (!) 144/102 (!) 149/97 (!) 149/97 (!) 145/84  Pulse: (!) 112 86  87  Resp: 18 18  18   Temp: 98.6 F (37 C) 99 F (37.2 C)  98.7 F (37.1 C)  TempSrc: Oral Oral  Oral  SpO2: 95% 94%  94%  Weight:      Height:        Intake/Output Summary (Last 24 hours) at 08/11/2023 0856 Last data filed at 08/11/2023  0008 Gross per 24 hour  Intake 288.71 ml  Output 1000 ml  Net -711.29 ml   Weight change:  Exam:  General:  Pt is alert, follows commands appropriately, not in acute distress HEENT: No icterus, No thrush, No neck mass, [place]/AT Cardiovascular: RRR, S1/S2, no rubs, no gallops Respiratory: bilateral crackles.  No wheeze Abdomen:  Soft/+BS, non tender, non distended, no guarding Extremities: 2 + RLE edema, No lymphangitis, No petechiae, No rashes, no synovitis   Data Reviewed: I have personally reviewed following labs and imaging studies Basic Metabolic Panel: Recent Labs  Lab 08/10/23 1427 08/11/23 0412  NA 139 142  K 2.9* 3.5  CL 102 104  CO2 28 27  GLUCOSE 123* 95  BUN 11 14  CREATININE 1.22 1.28*  CALCIUM 8.7* 8.4*  MG 1.8  --    Liver Function Tests: Recent Labs  Lab 08/10/23 1533  AST 19  ALT 14  ALKPHOS 73  BILITOT 1.0  PROT 7.0  ALBUMIN 3.5   No results for input(s): "LIPASE", "AMYLASE" in the last 168 hours. No results for input(s): "AMMONIA" in the last 168 hours. Coagulation Profile: No results for input(s): "INR", "PROTIME" in the last 168 hours. CBC: Recent Labs  Lab 08/10/23 1427  WBC 4.1  HGB 9.6*  HCT 31.3*  MCV 95.7  PLT 150   Cardiac Enzymes: No results for input(s): "CKTOTAL", "CKMB", "CKMBINDEX", "TROPONINI" in the last 168 hours. BNP: Invalid input(s): "POCBNP" CBG: No results for input(s): "GLUCAP" in the last 168 hours. HbA1C: No results for input(s): "HGBA1C" in the last 72 hours. Urine analysis:    Component Value Date/Time   COLORURINE STRAW (A) 08/10/2023 1650   APPEARANCEUR CLEAR 08/10/2023 1650   LABSPEC 1.006 08/10/2023 1650   PHURINE 7.0 08/10/2023 1650   GLUCOSEU NEGATIVE 08/10/2023 1650   HGBUR SMALL (A) 08/10/2023 1650   BILIRUBINUR NEGATIVE 08/10/2023 1650   KETONESUR NEGATIVE 08/10/2023 1650   PROTEINUR NEGATIVE 08/10/2023 1650   NITRITE NEGATIVE 08/10/2023 1650   LEUKOCYTESUR NEGATIVE 08/10/2023 1650   Sepsis Labs: @LABRCNTIP (procalcitonin:4,lacticidven:4) )No results found for this or any previous visit (from the past 240 hours).   Scheduled Meds:  apixaban  5 mg Oral BID   aspirin EC  81 mg Oral Daily   diltiazem  240 mg Oral Daily   furosemide  80 mg Intravenous BID   gabapentin  400 mg Oral TID   hydrALAZINE  50 mg Oral Q8H    isosorbide mononitrate  30 mg Oral Daily   labetalol  400 mg Oral BID   lisinopril  20 mg Oral Daily   potassium chloride  40 mEq Oral BID   spironolactone  50 mg Oral Daily   traZODone  100 mg Oral QHS   Continuous Infusions:  Procedures/Studies: DG Chest 2 View Result Date: 08/10/2023 CLINICAL DATA:  Shortness of breath EXAM: CHEST - 2 VIEW COMPARISON:  01/23/2023 FINDINGS: Stable cardiomegaly status post sternotomy. Aortic stent graft. Pulmonary vascular congestion. Diffuse bilateral interstitial opacities. No pleural effusion or pneumothorax. IMPRESSION: Cardiomegaly with pulmonary vascular congestion and diffuse bilateral interstitial opacities, likely edema. Electronically Signed   By: Duanne Guess D.O.   On: 08/10/2023 15:29   MR CHEST W WO CONTRAST Result Date: 08/05/2023 CLINICAL DATA:  Postsurgical soft tissue mass anterior to right axilla EXAM: MR CHEST WITH AND WITHOUT CONTRAST TECHNIQUE: Multiplanar, multisequence MR imaging of the right chest wall was performed before and after the administration of intravenous contrast. CONTRAST:  8.75mL GADAVIST GADOBUTROL 1 MMOL/ML  IV SOLN COMPARISON:  CT 01/23/2023 FINDINGS: Bones/Joint/Cartilage: The included osseous structures including the upper right ribs and imaged right shoulder girdle are intact. No fracture. No bone marrow edema. No bone lesion. Ligaments: Grossly intact. Muscles and Tendons: Mild intramuscular edema involving the superficial aspect of the right pectoralis major muscle (series 10, image 22). No intramuscular mass or fluid collection. Pectoralis tendon intact. Soft tissue: No solid or cystic mass lesion with attention to the anterolateral right chest wall corresponding to area of interest as demarcated by markers placed on the skin surface. No fluid collections. Abnormal amount of breast tissue/gynecomastia. Partially imaged thoracic aortic aneurysm with endovascular stent graft, not well assessed on chest wall protocol  images. IMPRESSION: 1. No mass lesion or fluid collection of the right chest wall. 2. Mild intramuscular edema involving the superficial aspect of the right pectoralis major muscle, likely representing a low-grade muscle strain. Intact pectoralis tendon. 3. Abnormal amount of breast tissue/gynecomastia. 4. Partially imaged thoracic aortic aneurysm with endovascular stent graft, not well assessed on chest wall protocol images. This was more fully evaluated on CT angiogram of the chest from 01/23/2023. Electronically Signed   By: Duanne Guess D.O.   On: 08/05/2023 09:18    Catarina Hartshorn, DO  [facility]  If 7PM-7AM, please contact night-coverage [email or web address] Password [email or web address] 08/11/2023, 8:56 AM   LOS: 1 day

## 2023-08-12 DIAGNOSIS — I5033 Acute on chronic diastolic (congestive) heart failure: Secondary | ICD-10-CM | POA: Diagnosis not present

## 2023-08-12 DIAGNOSIS — N1831 Chronic kidney disease, stage 3a: Secondary | ICD-10-CM | POA: Diagnosis not present

## 2023-08-12 DIAGNOSIS — I4819 Other persistent atrial fibrillation: Secondary | ICD-10-CM | POA: Diagnosis not present

## 2023-08-12 DIAGNOSIS — I10 Essential (primary) hypertension: Secondary | ICD-10-CM

## 2023-08-12 LAB — BASIC METABOLIC PANEL
Anion gap: 11 (ref 5–15)
BUN: 16 mg/dL (ref 8–23)
CO2: 27 mmol/L (ref 22–32)
Calcium: 8.6 mg/dL — ABNORMAL LOW (ref 8.9–10.3)
Chloride: 102 mmol/L (ref 98–111)
Creatinine, Ser: 1.24 mg/dL (ref 0.61–1.24)
GFR, Estimated: >60 mL/min (ref >60)
Glucose, Bld: 94 mg/dL (ref 70–99)
Potassium: 3.6 mmol/L (ref 3.5–5.1)
Sodium: 140 mmol/L (ref 135–145)

## 2023-08-12 LAB — HIV ANTIBODY (ROUTINE TESTING W REFLEX): HIV Screen 4th Generation wRfx: NONREACTIVE

## 2023-08-12 LAB — MAGNESIUM: Magnesium: 2 mg/dL (ref 1.7–2.4)

## 2023-08-12 NOTE — Progress Notes (Signed)
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  He states that the shortness of breath has progressively worsened, particularly over the past 2 to 3 days prior to admission.  He states that he drinks at least 6 x 20 ounces of fluid daily.   the patient states that he normally takes furosemide 40 mg daily, but has been doubling his dose for the past 6 days without much improvement.  He last weighed himself on 08/05/2023 at home, and it was 247 pounds.  The patient has had to sleep in a wheelchair because of shortness of breath.  He has a history of tobacco but has not smoked in 3 weeks.  In the ED, the patient had low-grade temperature of 99.0 F.  He was hemodynamically stable with oxygen saturation 94% room air.  WBC 139, potassium 2.9, bicarbonate 28, serum creatinine 1.22 WBC 4.1, hemoglobin 9.6, platelets 150.  BNP 147.  Troponin 32>> 31.  Chest x-ray showed vascular congestion with increased interstitial markings..  The patient was started IV furosemide.   Assessment and Plan:  Acute on chronic HFpEF -Presenting weight 254 pounds -Remains fluid overloaded -Continue IV furosemide -Daily weights -Accurate I's and O's -09/05/2020 echo EF 60 to 65%, trivial MR/AI, normal RVF -Repeat echo -12/15 ReDS = 44, recheck daily readings Filed Weights   08/10/23 1737 08/11/23 0930 08/12/23 0451  Weight: 115.3 kg 113.6 kg 113.4 kg    Intake/Output Summary (Last 24 hours) at  08/12/2023 1125 Last data filed at 08/12/2023 0830 Gross per 24 hour  Intake 480 ml  Output 3650 ml  Net -3170 ml    CKD stage IIIa -Baseline creatinine 1.2-1.5 -Monitor with diuresis   Essential hypertension -Continue diltiazem, hydralazine, spironolactone, Imdur -Anticipate improvement with IV diuresis   Elevated troponin -Secondary to demand ischemia in the setting of decompensated CHF -Troponin 32>> 31   Persistent atrial fibrillation -s/p DCCV 01/2020 -Subsequently went back into atrial fibrillation -Continue diltiazem -Continue apixaban   OSA -Continue CPAP   History of thoracic aortic dissection -underwent repair for type a aortic dissection in 2016  -02/06/2021 he had TEVAR extension and left renal artery stenting [facility] -Follow-up vascular surgery   Status post left AKA -Patient had necrotizing fasciitis 08/2020   Class II obesity -BMI 36.47 -Lifestyle modification   Hypokalemia -Repleted -magnesium - 2.0   Anemia of chronic disease -Baseline hemoglobin~11 -Check anemia panel   Peripheral vascular disease -underwent left common and external iliac artery and left renal vein stents on 09/19/2018 in preparation for total aortic arch repair    DVT prophylaxis: apixaban  Code Status: Full  Family Communication:  Disposition:    Consultants:   Procedures:   Antimicrobials:    Subjective: Pt reports he continues to urinate frequently on IV furosemide, he still feels overloaded with fluid  Objective: Vitals:   08/11/23 1330 08/11/23 1728 08/11/23 2101 08/12/23 0451  BP: 105/83 129/84 (!) 151/92 118/78  Pulse: 77 66 66 64  Resp: 18 18 19  19  Temp: 98.5 F (36.9 C)  98.3 F (36.8 C) 98.2 F (36.8 C)  TempSrc:   Oral Oral  SpO2: 95% 100% 95% 93%  Weight:    113.4 kg  Height:        Intake/Output Summary (Last 24 hours) at 08/12/2023 1122 Last data filed at 08/12/2023 0830 Gross per 24 hour  Intake 480 ml  Output 3650 ml  Net -3170 ml    Filed Weights   08/10/23 1737 08/11/23 0930 08/12/23 0451  Weight: 115.3 kg 113.6 kg 113.4 kg   Examination:  General exam: Appears calm and comfortable  Respiratory system: bibasilar crackles. Respiratory effort normal. Cardiovascular system: normal S1 & S2 heard. No JVD, murmurs, rubs, gallops or clicks. No pedal edema. Gastrointestinal system: Abdomen is nondistended, soft and nontender. No organomegaly or masses felt. Normal bowel sounds heard. Central nervous system: Alert and oriented. No focal neurological deficits. Extremities: s/p left AKA, 2+ edema RLE pitting Skin: No rashes, lesions or ulcers. Psychiatry: Judgement and insight appear normal. Mood & affect appropriate.   Data Reviewed: I have personally reviewed following labs and imaging studies  CBC: Recent Labs  Lab 08/10/23 1427  WBC 4.1  HGB 9.6*  HCT 31.3*  MCV 95.7  PLT 150    Basic Metabolic Panel: Recent Labs  Lab 08/10/23 1427 08/11/23 0412 08/12/23 0429  NA 139 142 140  K 2.9* 3.5 3.6  CL 102 104 102  CO2 28 27 27   GLUCOSE 123* 95 94  BUN 11 14 16   CREATININE 1.22 1.28* 1.24  CALCIUM 8.7* 8.4* 8.6*  MG 1.8  --  2.0    CBG: No results for input(s): "GLUCAP" in the last 168 hours.  No results found for this or any previous visit (from the past 240 hours).   Radiology Studies: ECHOCARDIOGRAM COMPLETE Result Date: 08/11/2023    ECHOCARDIOGRAM REPORT   Patient Name:   Connor [name] Date of Exam: 08/11/2023 Medical Rec #:  [id number]     Height:       70.0 in Accession #:    [id number]    Weight:       250.4 lb Date of Birth:  1956/05/30    BSA:          2.297 m Patient Age:    67 years      BP:           145/84 mmHg Patient Gender: M             HR:           106 bpm. [facility] Procedure: 2D Echo, Cardiac Doppler and Color Doppler Indications:    CHF  History:        Patient has prior history of Echocardiogram examinations, most                 recent 09/01/2020.  Sonographer:     Harriette Bouillon RDCS Referring Phys: [phone] JACOB J STINSON IMPRESSIONS  1. Left ventricular ejection fraction, by estimation, is 60 to 65%. The left ventricle has normal function. The left ventricle has no regional wall motion abnormalities. There is severe concentric left ventricular hypertrophy. Left ventricular diastolic  parameters are consistent with Grade II diastolic dysfunction (pseudonormalization). There is the interventricular septum is flattened in systole and diastole, consistent with right ventricular pressure and volume overload.  2. Right ventricular systolic function is normal. The right ventricular size is normal.  3. Left atrial size was moderately dilated.  4. Right atrial size was moderately dilated.  5. The mitral valve is normal in structure. No evidence of mitral valve regurgitation. No evidence of mitral stenosis.  6. The aortic valve is normal in structure. Aortic valve regurgitation is not visualized. No aortic stenosis is present.  7. The inferior vena cava is dilated in size with <50% respiratory variability, suggesting right atrial pressure of 15 mmHg. Comparison(s): No significant change from prior study. Prior images reviewed side by side. FINDINGS  Left Ventricle: Left ventricular ejection fraction, by estimation, is 60 to 65%. The left ventricle has normal function. The left ventricle has no regional wall motion abnormalities. The left ventricular internal cavity size was normal in size. There is  severe concentric left ventricular hypertrophy. The interventricular septum is flattened in systole and diastole, consistent with right ventricular pressure and volume overload. Left ventricular diastolic parameters are consistent with Grade II diastolic dysfunction (pseudonormalization). Right Ventricle: The right ventricular size is normal. No increase in right ventricular wall thickness. Right ventricular systolic function is normal. Left Atrium: Left atrial size was moderately dilated.  Right Atrium: Right atrial size was moderately dilated. Pericardium: There is no evidence of pericardial effusion. Mitral Valve: The mitral valve is normal in structure. No evidence of mitral valve regurgitation. No evidence of mitral valve stenosis. Tricuspid Valve: The tricuspid valve is normal in structure. Tricuspid valve regurgitation is not demonstrated. No evidence of tricuspid stenosis. Aortic Valve: The aortic valve is normal in structure. Aortic valve regurgitation is not visualized. No aortic stenosis is present. Pulmonic Valve: The pulmonic valve was normal in structure. Pulmonic valve regurgitation is not visualized. No evidence of pulmonic stenosis. Aorta: The aortic root is normal in size and structure. Venous: The inferior vena cava is dilated in size with less than 50% respiratory variability, suggesting right atrial pressure of 15 mmHg. IAS/Shunts: No atrial level shunt detected by color flow Doppler. Additional Comments: A is visualized in the right ventricle.  LEFT VENTRICLE PLAX 2D LVIDd:         5.10 cm   Diastology LVIDs:         3.30 cm   LV e' medial:    7.72 cm/s LV PW:         1.30 cm   LV E/e' medial:  14.2 LV IVS:        1.30 cm   LV e' lateral:   8.70 cm/s LVOT diam:     2.20 cm   LV E/e' lateral: 12.6 LV SV:         72 LV SV Index:   31 LVOT Area:     3.80 cm  RIGHT VENTRICLE         IVC TAPSE (M-mode): 1.3 cm  IVC diam: 3.40 cm LEFT ATRIUM              Index LA diam:        4.60 cm  2.00 cm/m LA Vol (A2C):   102.0 ml 44.41 ml/m LA Vol (A4C):   107.0 ml 46.59 ml/m LA Biplane Vol: 115.0 ml 50.07 ml/m  AORTIC VALVE LVOT Vmax:   98.40 cm/s LVOT Vmean:  73.700 cm/s LVOT VTI:    0.189 m  AORTA Ao Root diam: 3.40 cm Ao Asc diam:  3.20 cm MITRAL VALVE MV Area (PHT): 4.54 cm     SHUNTS MV Decel Time: 167 msec     Systemic VTI:  0.19 m MV E velocity: 110.00 cm/s  Systemic Diam: 2.20 cm Loraine Leriche  Skains MD Electronically signed by Donato Schultz MD Signature Date/Time: 08/11/2023/12:22:11 PM     Final    DG Chest 2 View Result Date: 08/10/2023 CLINICAL DATA:  Shortness of breath EXAM: CHEST - 2 VIEW COMPARISON:  01/23/2023 FINDINGS: Stable cardiomegaly status post sternotomy. Aortic stent graft. Pulmonary vascular congestion. Diffuse bilateral interstitial opacities. No pleural effusion or pneumothorax. IMPRESSION: Cardiomegaly with pulmonary vascular congestion and diffuse bilateral interstitial opacities, likely edema. Electronically Signed   By: Duanne Guess D.O.   On: 08/10/2023 15:29    Scheduled Meds:  apixaban  5 mg Oral BID   aspirin EC  81 mg Oral Daily   diltiazem  240 mg Oral Daily   furosemide  80 mg Intravenous BID   gabapentin  400 mg Oral TID   hydrALAZINE  50 mg Oral Q8H   isosorbide mononitrate  30 mg Oral Daily   labetalol  400 mg Oral BID   lisinopril  20 mg Oral Daily   potassium chloride  40 mEq Oral BID   spironolactone  50 mg Oral Daily   traZODone  100 mg Oral QHS   Continuous Infusions:   LOS: 2 days   Time spent: 43 mins  Monifa Blanchette Laural Benes, MD How to contact the [facility] Attending or Consulting provider 7A - 7P or covering provider during after hours 7P -7A, for this patient?  Check the care team in [facility] and look for a) attending/consulting [facility] provider listed and b) the [facility] team listed Log into [email or web address] to find provider on call.  Locate the [facility] provider you are looking for under [facility] and page to a number that you can be directly reached. If you still have difficulty reaching the provider, please page the [facility] (Director on Call) for the Hospitalists listed on amion for assistance.  08/12/2023, 11:22 AM

## 2023-08-12 NOTE — Progress Notes (Signed)
Mobility Specialist Progress Note:    08/12/23 1414  Mobility  Activity Ambulated with assistance in room;Ambulated with assistance in hallway;Stood at bedside  Level of Assistance Contact guard assist, steadying assist  Assistive Device Front wheel walker  Distance Ambulated (ft) 20 ft  Range of Motion/Exercises Active;All extremities  Activity Response Tolerated well  Mobility Referral Yes  Mobility visit 1 Mobility  Mobility Specialist Start Time (ACUTE ONLY) 1315  Mobility Specialist Stop Time (ACUTE ONLY) 1330  Mobility Specialist Time Calculation (min) (ACUTE ONLY) 15 min   Pt received in bed, agreeable to mobility. Required CGA to stand and ambulate with RW. Pt has left AKA, does not have prosthetic here but still able to use RW for short distances. Tolerated well, c/o SOB. SpO2 90% on RA. Returned pt sitting EOB, all needs met.   Lawerance Bach Mobility Specialist Please contact via Special educational needs teacher or  Rehab office at [phone]

## 2023-08-13 DIAGNOSIS — N1831 Chronic kidney disease, stage 3a: Secondary | ICD-10-CM | POA: Diagnosis not present

## 2023-08-13 DIAGNOSIS — I5033 Acute on chronic diastolic (congestive) heart failure: Secondary | ICD-10-CM | POA: Diagnosis not present

## 2023-08-13 DIAGNOSIS — E876 Hypokalemia: Secondary | ICD-10-CM | POA: Diagnosis not present

## 2023-08-13 DIAGNOSIS — I4819 Other persistent atrial fibrillation: Secondary | ICD-10-CM | POA: Diagnosis not present

## 2023-08-13 LAB — BASIC METABOLIC PANEL
Anion gap: 8 (ref 5–15)
BUN: 22 mg/dL (ref 8–23)
CO2: 30 mmol/L (ref 22–32)
Calcium: 8.7 mg/dL — ABNORMAL LOW (ref 8.9–10.3)
Chloride: 101 mmol/L (ref 98–111)
Creatinine, Ser: 1.36 mg/dL — ABNORMAL HIGH (ref 0.61–1.24)
GFR, Estimated: 57 mL/min — ABNORMAL LOW (ref >60)
Glucose, Bld: 99 mg/dL (ref 70–99)
Potassium: 4 mmol/L (ref 3.5–5.1)
Sodium: 139 mmol/L (ref 135–145)

## 2023-08-13 MED ORDER — FUROSEMIDE 40 MG PO TABS: 40.0000 mg | ORAL_TABLET | Freq: Two times a day (BID) | ORAL | 1 refills | Status: DC

## 2023-08-13 MED ORDER — POTASSIUM CHLORIDE CRYS ER 20 MEQ PO TBCR: 40.0000 meq | EXTENDED_RELEASE_TABLET | Freq: Every day | ORAL | Status: DC

## 2023-08-13 NOTE — Care Management Important Message (Signed)
Important Message  Patient Details  Name: Connor [name] MRN: [id number] Date of Birth: 11/27/1955   Important Message Given:  Yes - Medicare IM     Corey Harold 08/13/2023, 10:48 AM

## 2023-08-13 NOTE — Discharge Summary (Signed)
Physician Discharge Summary  Connor [name] GEX:[id number] DOB: May 20, 1956 DOA: 08/10/2023  PCP: Oneal Grout, FNP Cardiology: [facility]  Admit date: 08/10/2023 Discharge date: 08/13/2023  Admitted From: Home  Disposition: Home   Recommendations for Outpatient Follow-up:  Follow up with PCP in 1 weeks Follow up with cardiology in 2 weeks  Please obtain BMP in 1-2 weeks  Discharge Condition: STABLE   CODE STATUS: FULL DIET: heart healthy low sodium    Brief Hospitalization Summary: Please see all hospital notes, images, labs for full details of the hospitalization. 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  He states that the shortness of breath has progressively worsened, particularly over the past 2 to 3 days prior to admission.  He states that he drinks at least 6 x 20 ounces of fluid daily.   the patient states that he normally takes furosemide 40 mg daily, but has been doubling his dose for the past 6 days without much improvement.  He last weighed himself on 08/05/2023 at home, and it was 247 pounds.  The patient has had to sleep in a wheelchair because of shortness of breath.  He has a history of tobacco but has not smoked in 3 weeks.  In the ED, the patient had low-grade temperature of 99.0 F.  He was hemodynamically stable with oxygen saturation 94% room air.  WBC 139, potassium 2.9, bicarbonate 28, serum creatinine 1.22 WBC 4.1, hemoglobin 9.6, platelets 150.  BNP 147.  Troponin 32>> 31.  Chest x-ray showed vascular congestion with increased interstitial markings..  The patient was started IV furosemide.  Hospital Course by problem list  Acute on  chronic HFpEF -Presenting weight 254 pounds, DC weight 244 lbs -he was treated with IV furosemide 80 mg BID -Daily weights followed  -09/05/2020 echo EF 60 to 65%, trivial MR/AI, normal RVF -Repeat echo:  LVEF 60-65% with grade 2 DD -insists on discharging today for eye appt; dc on oral lasix 40 mg BID  -follow up with cardiology office in 2 weeks  Filed Weights   08/11/23 0930 08/12/23 0451 08/13/23 0547  Weight: 113.6 kg 113.4 kg 110.8 kg     Intake/Output Summary (Last 24 hours) at 08/13/2023 1015 Last data filed at 08/13/2023 0830 Gross per 24 hour  Intake 720 ml  Output 3500 ml  Net -2780 ml    CKD stage IIIa -Baseline creatinine 1.2-1.5 -Monitor with diuresis   Essential hypertension -Continue diltiazem, hydralazine, spironolactone, Imdur   Elevated troponin -Secondary to demand ischemia in the setting of decompensated CHF -Troponin 32>> 31   Persistent atrial fibrillation -s/p DCCV 01/2020 -Subsequently went back into atrial fibrillation -Continue diltiazem -Continue apixaban   OSA -Continue CPAP   History of thoracic aortic dissection -underwent repair for type a aortic dissection in 2016  -02/06/2021 he had TEVAR extension and left renal artery stenting [facility] -Follow-up vascular surgery   Status post left AKA -Patient had necrotizing fasciitis 08/2020   Class II obesity -BMI 36.47 -Lifestyle modification   Hypokalemia -Repleted -magnesium - 2.0   Anemia of chronic disease -Baseline hemoglobin~11 -Check anemia panel   Peripheral vascular disease -underwent left common and external iliac artery and left renal vein stents on 09/19/2018  in preparation for total aortic arch repair    Discharge Diagnoses:  Principal Problem:   Acute CHF (congestive heart failure) (HCC) Active Problems:   Persistent atrial fibrillation (HCC)   Hypokalemia   CKD (chronic kidney disease), stage III (HCC)   History of left above knee amputation (HCC)   Essential  hypertension   Acute on chronic diastolic CHF (congestive heart failure) (HCC)   Acute on chronic heart failure with preserved ejection fraction (HFpEF) (HCC)   Chronic kidney disease, stage 3a (HCC)   Class 2 obesity   Discharge Instructions: Discharge Instructions     Ambulatory referral to Cardiology   Complete by: As directed       Allergies as of 08/13/2023   No Known Allergies      Medication List     TAKE these medications    acetaminophen 325 MG tablet Commonly known as: TYLENOL Take 2 tablets (650 mg total) by mouth every 6 (six) hours as needed for moderate pain.   aspirin EC 81 MG tablet Take 81 mg by mouth daily. Swallow whole.   Cartia XT 240 MG 24 hr capsule Generic drug: diltiazem TAKE 1 CAPSULE (240 MG TOTAL) BY MOUTH DAILY.   Eliquis 5 MG Tabs tablet Generic drug: apixaban TAKE 1 TABLET (5 MG TOTAL) BY MOUTH 2 (TWO) TIMES DAILY.   furosemide 40 MG tablet Commonly known as: LASIX Take 1 tablet (40 mg total) by mouth 2 (two) times daily. What changed:  when to take this additional instructions   gabapentin 400 MG capsule Commonly known as: NEURONTIN TAKE 1 CAPSULE (400 MG TOTAL) BY MOUTH THREE TIMES DAILY.   hydrALAZINE 50 MG tablet Commonly known as: APRESOLINE Take 1 tablet by mouth in the morning, at noon, and at bedtime.   isosorbide mononitrate 30 MG 24 hr tablet Commonly known as: IMDUR Take 1 tablet (30 mg total) by mouth daily.   labetalol 200 MG tablet Commonly known as: NORMODYNE TAKE 2 TABLETS (400 MG TOTAL) BY MOUTH TWO TIMES DAILY.   lisinopril 20 MG tablet Commonly known as: ZESTRIL Take 20 mg by mouth daily.   multivitamin with minerals tablet Take 1 tablet by mouth daily. Men   potassium chloride SA 20 MEQ tablet Commonly known as: KLOR-CON M Take 20 mEq by mouth daily.   spironolactone 50 MG tablet Commonly known as: ALDACTONE Take 1 tablet (50 mg total) by mouth daily.   traZODone 100 MG tablet Commonly  known as: DESYREL Take 100 mg by mouth at bedtime.        Follow-up Information     [facility]. Schedule an appointment as soon as possible for a visit in 2 week(s).   Specialty: Cardiology Why: Hospital Follow Up Contact information: [address] [place] [address] [phone]        Oneal Grout, FNP. Schedule an appointment as soon as possible for a visit in 1 week(s).   Specialty: Family Medicine Why: Hospital Follow Up Contact information: [address] [place] [address] [phone]                No Known Allergies Allergies as of 08/13/2023   No Known Allergies      Medication List     TAKE these medications    acetaminophen 325 MG tablet Commonly known as: TYLENOL Take 2 tablets (650 mg total) by mouth every 6 (six) hours as needed for moderate pain.   aspirin EC 81  MG tablet Take 81 mg by mouth daily. Swallow whole.   Cartia XT 240 MG 24 hr capsule Generic drug: diltiazem TAKE 1 CAPSULE (240 MG TOTAL) BY MOUTH DAILY.   Eliquis 5 MG Tabs tablet Generic drug: apixaban TAKE 1 TABLET (5 MG TOTAL) BY MOUTH 2 (TWO) TIMES DAILY.   furosemide 40 MG tablet Commonly known as: LASIX Take 1 tablet (40 mg total) by mouth 2 (two) times daily. What changed:  when to take this additional instructions   gabapentin 400 MG capsule Commonly known as: NEURONTIN TAKE 1 CAPSULE (400 MG TOTAL) BY MOUTH THREE TIMES DAILY.   hydrALAZINE 50 MG tablet Commonly known as: APRESOLINE Take 1 tablet by mouth in the morning, at noon, and at bedtime.   isosorbide mononitrate 30 MG 24 hr tablet Commonly known as: IMDUR Take 1 tablet (30 mg total) by mouth daily.   labetalol 200 MG tablet Commonly known as: NORMODYNE TAKE 2 TABLETS (400 MG TOTAL) BY MOUTH TWO TIMES DAILY.   lisinopril 20 MG tablet Commonly known as: ZESTRIL Take 20 mg by mouth daily.   multivitamin with minerals tablet Take  1 tablet by mouth daily. Men   potassium chloride SA 20 MEQ tablet Commonly known as: KLOR-CON M Take 20 mEq by mouth daily.   spironolactone 50 MG tablet Commonly known as: ALDACTONE Take 1 tablet (50 mg total) by mouth daily.   traZODone 100 MG tablet Commonly known as: DESYREL Take 100 mg by mouth at bedtime.        Procedures/Studies: ECHOCARDIOGRAM COMPLETE Result Date: 08/11/2023    ECHOCARDIOGRAM REPORT   Patient Name:   Connor [name] Date of Exam: 08/11/2023 Medical Rec #:  [id number]     Height:       70.0 in Accession #:    [id number]    Weight:       250.4 lb Date of Birth:  1955-12-08    BSA:          2.297 m Patient Age:    67 years      BP:           145/84 mmHg Patient Gender: M             HR:           106 bpm. [facility] Procedure: 2D Echo, Cardiac Doppler and Color Doppler Indications:    CHF  History:        Patient has prior history of Echocardiogram examinations, most                 recent 09/01/2020.  Sonographer:    Harriette Bouillon RDCS Referring Phys: [id number] JACOB J STINSON IMPRESSIONS  1. Left ventricular ejection fraction, by estimation, is 60 to 65%. The left ventricle has normal function. The left ventricle has no regional wall motion abnormalities. There is severe concentric left ventricular hypertrophy. Left ventricular diastolic  parameters are consistent with Grade II diastolic dysfunction (pseudonormalization). There is the interventricular septum is flattened in systole and diastole, consistent with right ventricular pressure and volume overload.  2. Right ventricular systolic function is normal. The right ventricular size is normal.  3. Left atrial size was moderately dilated.  4. Right atrial size was moderately dilated.  5. The mitral valve is normal in structure. No evidence of mitral valve regurgitation. No evidence of mitral stenosis.  6. The aortic valve is normal in structure. Aortic valve regurgitation is not visualized. No aortic stenosis  is present.  7. The inferior vena cava is dilated in size with <50% respiratory variability, suggesting right atrial pressure of 15 mmHg. Comparison(s): No significant change from prior study. Prior images reviewed side by side. FINDINGS  Left Ventricle: Left ventricular ejection fraction, by estimation, is 60 to 65%. The left ventricle has normal function. The left ventricle has no regional wall motion abnormalities. The left ventricular internal cavity size was normal in size. There is  severe concentric left ventricular hypertrophy. The interventricular septum is flattened in systole and diastole, consistent with right ventricular pressure and volume overload. Left ventricular diastolic parameters are consistent with Grade II diastolic dysfunction (pseudonormalization). Right Ventricle: The right ventricular size is normal. No increase in right ventricular wall thickness. Right ventricular systolic function is normal. Left Atrium: Left atrial size was moderately dilated. Right Atrium: Right atrial size was moderately dilated. Pericardium: There is no evidence of pericardial effusion. Mitral Valve: The mitral valve is normal in structure. No evidence of mitral valve regurgitation. No evidence of mitral valve stenosis. Tricuspid Valve: The tricuspid valve is normal in structure. Tricuspid valve regurgitation is not demonstrated. No evidence of tricuspid stenosis. Aortic Valve: The aortic valve is normal in structure. Aortic valve regurgitation is not visualized. No aortic stenosis is present. Pulmonic Valve: The pulmonic valve was normal in structure. Pulmonic valve regurgitation is not visualized. No evidence of pulmonic stenosis. Aorta: The aortic root is normal in size and structure. Venous: The inferior vena cava is dilated in size with less than 50% respiratory variability, suggesting right atrial pressure of 15 mmHg. IAS/Shunts: No atrial level shunt detected by color flow Doppler. Additional Comments: A is  visualized in the right ventricle.  LEFT VENTRICLE PLAX 2D LVIDd:         5.10 cm   Diastology LVIDs:         3.30 cm   LV e' medial:    7.72 cm/s LV PW:         1.30 cm   LV E/e' medial:  14.2 LV IVS:        1.30 cm   LV e' lateral:   8.70 cm/s LVOT diam:     2.20 cm   LV E/e' lateral: 12.6 LV SV:         72 LV SV Index:   31 LVOT Area:     3.80 cm  RIGHT VENTRICLE         IVC TAPSE (M-mode): 1.3 cm  IVC diam: 3.40 cm LEFT ATRIUM              Index LA diam:        4.60 cm  2.00 cm/m LA Vol (A2C):   102.0 ml 44.41 ml/m LA Vol (A4C):   107.0 ml 46.59 ml/m LA Biplane Vol: 115.0 ml 50.07 ml/m  AORTIC VALVE LVOT Vmax:   98.40 cm/s LVOT Vmean:  73.700 cm/s LVOT VTI:    0.189 m  AORTA Ao Root diam: 3.40 cm Ao Asc diam:  3.20 cm MITRAL VALVE MV Area (PHT): 4.54 cm     SHUNTS MV Decel Time: 167 msec     Systemic VTI:  0.19 m MV E velocity: 110.00 cm/s  Systemic Diam: 2.20 cm Donato Schultz MD Electronically signed by Donato Schultz MD Signature Date/Time: 08/11/2023/12:22:11 PM    Final    DG Chest 2 View Result Date: 08/10/2023 CLINICAL DATA:  Shortness of breath EXAM: CHEST - 2 VIEW COMPARISON:  01/23/2023 FINDINGS: Stable cardiomegaly status post sternotomy. Aortic stent  graft. Pulmonary vascular congestion. Diffuse bilateral interstitial opacities. No pleural effusion or pneumothorax. IMPRESSION: Cardiomegaly with pulmonary vascular congestion and diffuse bilateral interstitial opacities, likely edema. Electronically Signed   By: Duanne Guess D.O.   On: 08/10/2023 15:29     Subjective: Pt reports that he needs to discharge for eye appt, says he insists on discharging home, reports he feels better, he has been diuresing all night; says he will follow up with his cardiology group.   Discharge Exam: Vitals:   08/13/23 0548 08/13/23 0548  BP: (!) 148/79 (!) 148/79  Pulse:  (!) 58  Resp:  20  Temp:  97.8 F (36.6 C)  SpO2:  94%   Vitals:   08/12/23 2132 08/13/23 0547 08/13/23 0548 08/13/23 0548  BP:  (!) 137/90  (!) 148/79 (!) 148/79  Pulse: 65   (!) 58  Resp:    20  Temp:    97.8 F (36.6 C)  TempSrc:    Oral  SpO2:    94%  Weight:  110.8 kg    Height:        General exam: Appears calm and comfortable  Respiratory system: no crackles or rales heard. Respiratory effort normal. Cardiovascular system: normal S1 & S2 heard. No JVD, murmurs, rubs, gallops or clicks. No pedal edema. Gastrointestinal system: Abdomen is nondistended, soft and nontender. No organomegaly or masses felt. Normal bowel sounds heard. Central nervous system: Alert and oriented. No focal neurological deficits. Extremities: s/p left AKA, 1+ edema RLE pitting Skin: No rashes, lesions or ulcers. Psychiatry: Judgement and insight appear normal. Mood & affect appropriate   The results of significant diagnostics from this hospitalization (including imaging, microbiology, ancillary and laboratory) are listed below for reference.     Microbiology: No results found for this or any previous visit (from the past 240 hours).   Labs: BNP (last 3 results) Recent Labs    01/23/23 1736 08/10/23 1427  BNP 78.0 147.0*   Basic Metabolic Panel: Recent Labs  Lab 08/10/23 1427 08/11/23 0412 08/12/23 0429 08/13/23 0424  NA 139 142 140 139  K 2.9* 3.5 3.6 4.0  CL 102 104 102 101  CO2 28 27 27 30   GLUCOSE 123* 95 94 99  BUN 11 14 16 22   CREATININE 1.22 1.28* 1.24 1.36*  CALCIUM 8.7* 8.4* 8.6* 8.7*  MG 1.8  --  2.0  --    Liver Function Tests: Recent Labs  Lab 08/10/23 1533  AST 19  ALT 14  ALKPHOS 73  BILITOT 1.0  PROT 7.0  ALBUMIN 3.5   No results for input(s): "LIPASE", "AMYLASE" in the last 168 hours. No results for input(s): "AMMONIA" in the last 168 hours. CBC: Recent Labs  Lab 08/10/23 1427  WBC 4.1  HGB 9.6*  HCT 31.3*  MCV 95.7  PLT 150   Cardiac Enzymes: No results for input(s): "CKTOTAL", "CKMB", "CKMBINDEX", "TROPONINI" in the last 168 hours. BNP: Invalid input(s):  "POCBNP" CBG: No results for input(s): "GLUCAP" in the last 168 hours. D-Dimer No results for input(s): "DDIMER" in the last 72 hours. Hgb A1c No results for input(s): "HGBA1C" in the last 72 hours. Lipid Profile No results for input(s): "CHOL", "HDL", "LDLCALC", "TRIG", "CHOLHDL", "LDLDIRECT" in the last 72 hours. Thyroid function studies No results for input(s): "TSH", "T4TOTAL", "T3FREE", "THYROIDAB" in the last 72 hours.  Invalid input(s): "FREET3" Anemia work up No results for input(s): "VITAMINB12", "FOLATE", "FERRITIN", "TIBC", "IRON", "RETICCTPCT" in the last 72 hours. Urinalysis    Component  Value Date/Time   COLORURINE STRAW (A) 08/10/2023 1650   APPEARANCEUR CLEAR 08/10/2023 1650   LABSPEC 1.006 08/10/2023 1650   PHURINE 7.0 08/10/2023 1650   GLUCOSEU NEGATIVE 08/10/2023 1650   HGBUR SMALL (A) 08/10/2023 1650   BILIRUBINUR NEGATIVE 08/10/2023 1650   KETONESUR NEGATIVE 08/10/2023 1650   PROTEINUR NEGATIVE 08/10/2023 1650   NITRITE NEGATIVE 08/10/2023 1650   LEUKOCYTESUR NEGATIVE 08/10/2023 1650   Sepsis Labs Recent Labs  Lab 08/10/23 1427  WBC 4.1   Microbiology No results found for this or any previous visit (from the past 240 hours).  Time coordinating discharge:  43 mins   SIGNED:  Standley Dakins, MD  [facility] 08/13/2023, 10:11 AM How to contact the [facility] Attending or Consulting provider 7A - 7P or covering provider during after hours 7P -7A, for this patient?  Check the care team in [facility] and look for a) attending/consulting [facility] provider listed and b) the [facility] team listed Log into [email or web address] and use 's universal password to access. If you do not have the password, please contact the hospital operator. Locate the [facility] provider you are looking for under [facility] and page to a number that you can be directly reached. If you still have difficulty reaching the provider, please page the [facility] (Director on Call) for the Hospitalists  listed on amion for assistance.

## 2023-09-02 ENCOUNTER — Emergency Department (HOSPITAL_COMMUNITY): Payer: 59

## 2023-09-02 ENCOUNTER — Inpatient Hospital Stay (HOSPITAL_COMMUNITY)
Admission: EM | Admit: 2023-09-02 | Discharge: 2023-09-10 | DRG: 291 | Disposition: A | Discharge status: Home Health | Payer: 59 | Attending: Family Medicine | Admitting: Family Medicine

## 2023-09-02 ENCOUNTER — Encounter (HOSPITAL_COMMUNITY): Payer: Self-pay

## 2023-09-02 ENCOUNTER — Other Ambulatory Visit: Payer: Self-pay

## 2023-09-02 DIAGNOSIS — I2510 Atherosclerotic heart disease of native coronary artery without angina pectoris: Secondary | ICD-10-CM | POA: Diagnosis present

## 2023-09-02 DIAGNOSIS — Z87891 Personal history of nicotine dependence: Secondary | ICD-10-CM

## 2023-09-02 DIAGNOSIS — G4733 Obstructive sleep apnea (adult) (pediatric): Secondary | ICD-10-CM | POA: Diagnosis present

## 2023-09-02 DIAGNOSIS — N182 Chronic kidney disease, stage 2 (mild): Secondary | ICD-10-CM | POA: Diagnosis present

## 2023-09-02 DIAGNOSIS — I509 Heart failure, unspecified: Secondary | ICD-10-CM | POA: Diagnosis not present

## 2023-09-02 DIAGNOSIS — I5033 Acute on chronic diastolic (congestive) heart failure: Secondary | ICD-10-CM

## 2023-09-02 DIAGNOSIS — I4819 Other persistent atrial fibrillation: Secondary | ICD-10-CM | POA: Diagnosis present

## 2023-09-02 DIAGNOSIS — R0902 Hypoxemia: Secondary | ICD-10-CM | POA: Diagnosis present

## 2023-09-02 DIAGNOSIS — Z6837 Body mass index (BMI) 37.0-37.9, adult: Secondary | ICD-10-CM

## 2023-09-02 DIAGNOSIS — I1A0 Resistant hypertension: Secondary | ICD-10-CM | POA: Diagnosis present

## 2023-09-02 DIAGNOSIS — Z8679 Personal history of other diseases of the circulatory system: Secondary | ICD-10-CM

## 2023-09-02 DIAGNOSIS — E876 Hypokalemia: Secondary | ICD-10-CM | POA: Diagnosis present

## 2023-09-02 DIAGNOSIS — N179 Acute kidney failure, unspecified: Secondary | ICD-10-CM | POA: Diagnosis present

## 2023-09-02 DIAGNOSIS — I4510 Unspecified right bundle-branch block: Secondary | ICD-10-CM | POA: Diagnosis present

## 2023-09-02 DIAGNOSIS — I5031 Acute diastolic (congestive) heart failure: Principal | ICD-10-CM

## 2023-09-02 DIAGNOSIS — Z79899 Other long term (current) drug therapy: Secondary | ICD-10-CM

## 2023-09-02 DIAGNOSIS — Z7901 Long term (current) use of anticoagulants: Secondary | ICD-10-CM

## 2023-09-02 DIAGNOSIS — Z89612 Acquired absence of left leg above knee: Secondary | ICD-10-CM

## 2023-09-02 DIAGNOSIS — Z8249 Family history of ischemic heart disease and other diseases of the circulatory system: Secondary | ICD-10-CM

## 2023-09-02 DIAGNOSIS — E66812 Obesity, class 2: Secondary | ICD-10-CM | POA: Diagnosis present

## 2023-09-02 DIAGNOSIS — Z7982 Long term (current) use of aspirin: Secondary | ICD-10-CM

## 2023-09-02 DIAGNOSIS — R001 Bradycardia, unspecified: Secondary | ICD-10-CM | POA: Diagnosis present

## 2023-09-02 DIAGNOSIS — Z823 Family history of stroke: Secondary | ICD-10-CM

## 2023-09-02 DIAGNOSIS — Z951 Presence of aortocoronary bypass graft: Secondary | ICD-10-CM

## 2023-09-02 DIAGNOSIS — I130 Hypertensive heart and chronic kidney disease with heart failure and stage 1 through stage 4 chronic kidney disease, or unspecified chronic kidney disease: Secondary | ICD-10-CM | POA: Diagnosis not present

## 2023-09-02 DIAGNOSIS — I71019 Dissection of thoracic aorta, unspecified: Secondary | ICD-10-CM | POA: Diagnosis present

## 2023-09-02 DIAGNOSIS — S81801A Unspecified open wound, right lower leg, initial encounter: Secondary | ICD-10-CM

## 2023-09-02 DIAGNOSIS — I5A Non-ischemic myocardial injury (non-traumatic): Secondary | ICD-10-CM | POA: Diagnosis present

## 2023-09-02 LAB — BASIC METABOLIC PANEL
Anion gap: 6 (ref 5–15)
BUN: 19 mg/dL (ref 8–23)
CO2: 28 mmol/L (ref 22–32)
Calcium: 8.5 mg/dL — ABNORMAL LOW (ref 8.9–10.3)
Chloride: 107 mmol/L (ref 98–111)
Creatinine, Ser: 1.21 mg/dL (ref 0.61–1.24)
GFR, Estimated: >60 mL/min (ref >60)
Glucose, Bld: 99 mg/dL (ref 70–99)
Potassium: 3.3 mmol/L — ABNORMAL LOW (ref 3.5–5.1)
Sodium: 141 mmol/L (ref 135–145)

## 2023-09-02 LAB — CBC
HCT: 31.8 % — ABNORMAL LOW (ref 39.0–52.0)
Hemoglobin: 9.6 g/dL — ABNORMAL LOW (ref 13.0–17.0)
MCH: 29.4 pg (ref 26.0–34.0)
MCHC: 30.2 g/dL (ref 30.0–36.0)
MCV: 97.2 fL (ref 80.0–100.0)
Platelets: 155 10*3/uL (ref 150–400)
RBC: 3.27 MIL/uL — ABNORMAL LOW (ref 4.22–5.81)
RDW: 16 % — ABNORMAL HIGH (ref 11.5–15.5)
WBC: 5.4 10*3/uL (ref 4.0–10.5)
nRBC: 0 % (ref 0.0–0.2)

## 2023-09-02 LAB — MAGNESIUM: Magnesium: 2.3 mg/dL (ref 1.7–2.4)

## 2023-09-02 LAB — BRAIN NATRIURETIC PEPTIDE: B Natriuretic Peptide: 132 pg/mL — ABNORMAL HIGH (ref 0.0–100.0)

## 2023-09-02 LAB — TROPONIN I (HIGH SENSITIVITY)
Troponin I (High Sensitivity): 24 ng/L — ABNORMAL HIGH (ref <18)
Troponin I (High Sensitivity): 27 ng/L — ABNORMAL HIGH (ref <18)

## 2023-09-02 MED ORDER — ASPIRIN 81 MG PO TBEC
81.0000 mg | DELAYED_RELEASE_TABLET | Freq: Every day | ORAL | Status: DC
  Administered 2023-09-03 – 2023-09-10 (×8): 81 mg via ORAL
  Filled 2023-09-02 (×8): qty 1

## 2023-09-02 MED ORDER — SODIUM CHLORIDE 0.9% FLUSH
3.0000 mL | Freq: Two times a day (BID) | INTRAVENOUS | Status: DC
  Administered 2023-09-02 – 2023-09-10 (×12): 3 mL via INTRAVENOUS

## 2023-09-02 MED ORDER — FUROSEMIDE 10 MG/ML IJ SOLN
80.0000 mg | Freq: Once | INTRAMUSCULAR | Status: AC
  Administered 2023-09-03: 80 mg via INTRAVENOUS
  Filled 2023-09-02: qty 8

## 2023-09-02 MED ORDER — APIXABAN 5 MG PO TABS
5.0000 mg | ORAL_TABLET | Freq: Two times a day (BID) | ORAL | Status: DC
  Administered 2023-09-02 – 2023-09-10 (×16): 5 mg via ORAL
  Filled 2023-09-02 (×16): qty 1

## 2023-09-02 MED ORDER — OXYCODONE-ACETAMINOPHEN 5-325 MG PO TABS
1.0000 | ORAL_TABLET | Freq: Once | ORAL | Status: AC
Start: 2023-09-02 — End: 2023-09-02
  Administered 2023-09-02: 1 via ORAL
  Filled 2023-09-02: qty 1

## 2023-09-02 MED ORDER — LABETALOL HCL 200 MG PO TABS
400.0000 mg | ORAL_TABLET | Freq: Two times a day (BID) | ORAL | Status: DC
  Administered 2023-09-02 – 2023-09-09 (×15): 400 mg via ORAL
  Filled 2023-09-02 (×16): qty 2

## 2023-09-02 MED ORDER — POTASSIUM CHLORIDE CRYS ER 20 MEQ PO TBCR
40.0000 meq | EXTENDED_RELEASE_TABLET | Freq: Once | ORAL | Status: AC
  Administered 2023-09-02: 40 meq via ORAL
  Filled 2023-09-02: qty 2

## 2023-09-02 MED ORDER — SPIRONOLACTONE 25 MG PO TABS
50.0000 mg | ORAL_TABLET | Freq: Every day | ORAL | Status: DC
  Administered 2023-09-03 – 2023-09-10 (×8): 50 mg via ORAL
  Filled 2023-09-02 (×8): qty 2

## 2023-09-02 MED ORDER — GABAPENTIN 400 MG PO CAPS
400.0000 mg | ORAL_CAPSULE | Freq: Three times a day (TID) | ORAL | Status: DC
  Administered 2023-09-02 – 2023-09-10 (×23): 400 mg via ORAL
  Filled 2023-09-02 (×23): qty 1

## 2023-09-02 MED ORDER — TRAZODONE HCL 50 MG PO TABS
100.0000 mg | ORAL_TABLET | Freq: Every day | ORAL | Status: DC
  Administered 2023-09-02 – 2023-09-09 (×8): 100 mg via ORAL
  Filled 2023-09-02 (×8): qty 2

## 2023-09-02 MED ORDER — LISINOPRIL 10 MG PO TABS
20.0000 mg | ORAL_TABLET | Freq: Every day | ORAL | Status: DC
  Administered 2023-09-03 – 2023-09-04 (×2): 20 mg via ORAL
  Filled 2023-09-02 (×2): qty 2

## 2023-09-02 MED ORDER — FUROSEMIDE 10 MG/ML IJ SOLN
80.0000 mg | Freq: Once | INTRAMUSCULAR | Status: AC
Start: 2023-09-02 — End: 2023-09-02
  Administered 2023-09-02: 80 mg via INTRAVENOUS
  Filled 2023-09-02: qty 8

## 2023-09-02 MED ORDER — HYDRALAZINE HCL 50 MG PO TABS
50.0000 mg | ORAL_TABLET | Freq: Three times a day (TID) | ORAL | Status: DC
  Administered 2023-09-02 – 2023-09-10 (×19): 50 mg via ORAL
  Filled 2023-09-02 (×7): qty 1
  Filled 2023-09-02: qty 2
  Filled 2023-09-02 (×3): qty 1
  Filled 2023-09-02: qty 2
  Filled 2023-09-02 (×10): qty 1
  Filled 2023-09-02: qty 2
  Filled 2023-09-02: qty 1

## 2023-09-02 MED ORDER — ISOSORBIDE MONONITRATE ER 30 MG PO TB24
30.0000 mg | ORAL_TABLET | Freq: Every day | ORAL | Status: DC
  Administered 2023-09-03 – 2023-09-10 (×8): 30 mg via ORAL
  Filled 2023-09-02 (×8): qty 1

## 2023-09-02 MED ORDER — DILTIAZEM HCL ER COATED BEADS 240 MG PO CP24
240.0000 mg | ORAL_CAPSULE | Freq: Every day | ORAL | Status: DC
  Administered 2023-09-03 – 2023-09-09 (×7): 240 mg via ORAL
  Filled 2023-09-02 (×5): qty 1
  Filled 2023-09-02: qty 2
  Filled 2023-09-02 (×2): qty 1

## 2023-09-02 NOTE — H&P (Signed)
 History and Physical    Connor [name] FMW:[id number] DOB: 28-Dec-1955 DOA: 09/02/2023  PCP: Myra Geni ORN, FNP   Patient coming from: Home   Chief Complaint:  Chief Complaint  Patient presents with   Chest Pain    HPI:  Connor [name] is a 68 y.o. 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  He reports after his discharge he was initially taking Lasix  80 mg p.o. twice daily (although Rx was for 40 mg twice daily) for the first week.  Then because his sister was worried he was going to hurt his kidneys he reduced to 40 mg p.o. twice daily.  Reports despite even the 80 mg dosing that he was not diuresing well.  He checks his weights at home and his weight had gone up from 244 pound to 250 pounds over the first week; due to his weight gain he started eating less.  Did not contact his doctors about weight gain and diuretic dosing.  He has a number of misconceptions about heart failure treatment.  He believed that drinking more water would help the Lasix  work better and help him pee more, so he has been drinking well over 2 L/day.  He also reports that his sister thinks that the mail pharmacy medications are fake.  He reports progressive dyspnea on exertion, lower extremity swelling.  Reports chest tightness over the past few days.  He has wounds that have developed on the right leg since December.   Review of Systems:  ROS complete and negative except as marked above   No Known Allergies  Prior to Admission medications   Medication Sig Start Date End Date Taking? Authorizing Provider  acetaminophen  (TYLENOL ) 325 MG tablet Take 2 tablets (650 mg total) by mouth every 6 (six) hours as  needed for moderate pain. 10/10/20  Yes Arrien, Elidia Sieving, MD  aspirin  EC 81 MG tablet Take 81 mg by mouth daily. Swallow whole.   Yes [provider]  diltiazem  (CARDIZEM  CD) 240 MG 24 hr capsule TAKE 1 CAPSULE (240 MG TOTAL) BY MOUTH DAILY. 09/29/20 09/02/23 Yes Angiulli, Sieving PARAS, PA-C  ELIQUIS  5 MG TABS tablet TAKE 1 TABLET (5 MG TOTAL) BY MOUTH 2 (TWO) TIMES DAILY. 09/29/20 09/02/23 Yes Angiulli, Sieving PARAS, PA-C  furosemide  (LASIX ) 40 MG tablet Take 1 tablet (40 mg total) by mouth 2 (two) times daily. 08/13/23  Yes Johnson, Clanford L, MD  gabapentin  (NEURONTIN ) 400 MG capsule TAKE 1 CAPSULE (400 MG TOTAL) BY MOUTH THREE TIMES DAILY. 09/29/20 09/02/23 Yes Angiulli, Sieving PARAS, PA-C  hydrALAZINE  (APRESOLINE ) 50 MG tablet Take 1 tablet by mouth in the morning, at noon, and at bedtime.   Yes [provider]  isosorbide  mononitrate (IMDUR ) 30 MG 24 hr tablet Take 1 tablet (30 mg total) by mouth daily. 12/10/20  Yes Ricky Fines, MD  labetalol  (NORMODYNE ) 200 MG tablet TAKE 2 TABLETS (400 MG TOTAL) BY MOUTH TWO TIMES DAILY. 09/29/20 09/02/23 Yes Angiulli, Sieving PARAS, PA-C  lisinopril  (ZESTRIL ) 20 MG tablet Take 20 mg by mouth daily.   Yes [provider]  Multiple Vitamins-Minerals (MULTIVITAMIN WITH MINERALS) tablet Take 1 tablet by mouth daily. Men   Yes [provider]  potassium chloride  SA (KLOR-CON  M) 20 MEQ tablet  Take 20 mEq by mouth daily.   Yes [provider]  spironolactone  (ALDACTONE ) 50 MG tablet Take 1 tablet (50 mg total) by mouth daily. 08/15/21  Yes Kate Lonni CROME, MD  traZODone  (DESYREL ) 100 MG tablet Take 100 mg by mouth at bedtime. 11/28/20  Yes [provider]    Past Medical History:  Diagnosis Date   AAA (abdominal aortic aneurysm) (HCC)    History of open heart surgery    Hypertension    Seroma due to trauma [facility])     Past Surgical History:  Procedure Laterality Date   ABDOMINAL AORTIC ANEURYSM REPAIR     AMPUTATION  Left 09/07/2020   Procedure: ATTEMPTED LEFT LEG DEBRIDEMENT FASCIOTOMIES, APPLY INSTILLATION WOUND VAC, ABOVE KNEE AMPUTATION;  Surgeon: Harden Jerona GAILS, MD;  Location: [facility] OR;  Service: Orthopedics;  Laterality: Left;   BUBBLE STUDY  09/05/2020   Procedure: BUBBLE STUDY;  Surgeon: Hobart Powell BRAVO, MD;  Location: [facility] ENDOSCOPY;  Service: Cardiovascular;;   CARDIOVERSION N/A 02/11/2020   Procedure: CARDIOVERSION;  Surgeon: Kate Lonni CROME, MD;  Location: [facility] ENDOSCOPY;  Service: Cardiovascular;  Laterality: N/A;   CORONARY ARTERY BYPASS GRAFT     STUMP REVISION Left 09/09/2020   Procedure: REVISION LEFT ABOVE KNEE AMPUTATION;  Surgeon: Harden Jerona GAILS, MD;  Location: [facility] OR;  Service: Orthopedics;  Laterality: Left;   TEE WITHOUT CARDIOVERSION N/A 09/05/2020   Procedure: TRANSESOPHAGEAL ECHOCARDIOGRAM (TEE);  Surgeon: Hobart Powell BRAVO, MD;  Location: [facility] ENDOSCOPY;  Service: Cardiovascular;  Laterality: N/A;     reports that he quit smoking about 5 weeks ago. His smoking use included cigarettes. He has never used smokeless tobacco. He reports that he does not currently use alcohol. He reports that he does not use drugs.  Family History  Problem Relation Age of Onset   CAD Mother    CVA Father      Physical Exam: Vitals:   09/02/23 1900 09/02/23 1915 09/02/23 1930 09/02/23 2043  BP: 139/84 (!) 144/78 (!) 163/112   Pulse: 67 66 65   Resp: (!) 24 20 17    Temp:      TempSrc:      SpO2: 95% 93% 95%   Weight:    117.9 kg  Height:    5' 10 (1.778 m)    Gen: Awake, alert, NAD   CV: Regular, normal S1, S2, no murmurs  Resp: Normal WOB, rales in the bases Abd: Flat, normoactive, nontender MSK: Left AKA well-perfused, right lower extremity with woody edema.  There are heaped up ulcerations on the anterior right shin draining clear serous fluid, no surrounding warmth. Skin: See MSK for additional findings, no other rashes or lesions to exposed skin Neuro: Alert and interactive   Psych: euthymic, appropriate, insight is limited.   Data review:   Labs reviewed, notable for:   BNP 132 High-sensitivity troponin 24 K3.3  Micro:  Results for orders placed or performed during the hospital encounter of 12/07/20  Resp Panel by RT-PCR (Flu A&B, Covid) Nasopharyngeal Swab     Status: Abnormal   Collection Time: 12/07/20  5:45 PM   Specimen: Nasopharyngeal Swab; Nasopharyngeal(NP) swabs in vial transport medium  Result Value Ref Range Status   SARS Coronavirus 2 by RT PCR POSITIVE (A) NEGATIVE Final    Comment: RESULT CALLED TO, READ BACK BY AND VERIFIED WITH: LAMBERT,S RN @1907  12/07/20 BY JONES,T (NOTE) SARS-CoV-2 target nucleic acids are DETECTED.  The SARS-CoV-2 RNA is generally detectable in upper respiratory specimens during the  acute phase of infection. Positive results are indicative of the presence of the identified virus, but do not rule out bacterial infection or co-infection with other pathogens not detected by the test. Clinical correlation with patient history and other diagnostic information is necessary to determine patient infection status. The expected result is Negative.  Fact Sheet for Patients: bloggercourse.com  Fact Sheet for Healthcare Providers: seriousbroker.it  This test is not yet approved or cleared by the [place]  FDA and  has been authorized for detection and/or diagnosis of SARS-CoV-2 by FDA under an Emergency Use Authorization (EUA).  This EUA will remain in effect (meaning this test ca n be used) for the duration of  the COVID-19 declaration under Section 564(b)(1) of the Act, 21 U.S.C. section 360bbb-3(b)(1), unless the authorization is terminated or revoked sooner.     Influenza A by PCR NEGATIVE NEGATIVE Final   Influenza B by PCR NEGATIVE NEGATIVE Final    Comment: (NOTE) The Xpert Xpress SARS-CoV-2/FLU/RSV plus assay is intended as an aid in the diagnosis of  influenza from Nasopharyngeal swab specimens and should not be used as a sole basis for treatment. Nasal washings and aspirates are unacceptable for Xpert Xpress SARS-CoV-2/FLU/RSV testing.  Fact Sheet for Patients: bloggercourse.com  Fact Sheet for Healthcare Providers: seriousbroker.it  This test is not yet approved or cleared by the [place]  FDA and has been authorized for detection and/or diagnosis of SARS-CoV-2 by FDA under an Emergency Use Authorization (EUA). This EUA will remain in effect (meaning this test can be used) for the duration of the COVID-19 declaration under Section 564(b)(1) of the Act, 21 U.S.C. section 360bbb-3(b)(1), unless the authorization is terminated or revoked.  Performed at [facility], [address] [place], [place] [address]     Imaging reviewed:  DG Chest 2 View Result Date: 09/02/2023 CLINICAL DATA:  Heart failure, chest pain EXAM: CHEST - 2 VIEW COMPARISON:  08/10/2023 FINDINGS: Gross cardiomegaly. Status post median sternotomy as well as stent endograft repair of the aortic arch and descending thoracic aorta, unchanged in appearance. Pulmonary vascular prominence and mild diffuse interstitial opacity, similar to prior. No acute osseous findings. IMPRESSION: 1. Gross cardiomegaly with pulmonary vascular prominence and mild diffuse interstitial opacity, similar to prior, consistent with edema. No new or focal airspace opacity. 2. Status post median sternotomy as well as stent endograft repair of the aortic arch and descending thoracic aorta, unchanged in appearance. Electronically Signed   By: Marolyn JONETTA Jaksch M.D.   On: 09/02/2023 17:39   ED Course:  Treated with Lasix  80 mg IV, oxycodone , potassium   Assessment/Plan:  68 y.o. 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  Admitted for heart failure exacerbation  Acute exacerbation heart failure with preserved ejection fraction Reports weight gain of approximately 10 pound to 253 lb since last discharge (dry approximately 244 lb = 110 kg).  He had recent TTE last admission on 12/15 demonstrating LVEF 60 to 65%, severe concentric LVH, grade 2 diastolic dysfunction, biatrial enlargement, elevated RA pressure. Etiology of his heart failure exacerbation appears to be inadequate Lasix  dose (Rx at discharge 40 mg BID, but reports even inadequate diuresis with 80 mg BID), increase fluid intake (2+ L per day), and failure to respond to increasing weights  in the outpatient setting. - Status post Lasix  80 mg IV x 1 in the ED, redose 80 mg IV at 2 AM - Consider switch to torsemide  once more euvolemic - Daily weights, strict I's and O's - Heart failure navigator and TOC consult - Needs education about low-sodium diet, fluid restriction, will need instruction on monitoring weights and prn dosing of diuretics,  Chronic myocardial injury Low-level elevation in high-sensitivity troponin at 24, has elevated trops similarly going back to 2022. EKG with no acute acute ischemic changes. - Repeat troponin.  Then stop trend likely related to heart failure management directed above  Right lower extremity wounds - Wound care consult  Hypokalemia - Repleted  Chronic medical problems: Resistant hypertension: Continue home diltiazem , hydralazine , spironolactone , ISMN History of aortic dissection, endovascular repair: Hypertensive management per above, outpatient follow-up surveillance CAD status post CABG, PAD: Continue aspirin , ? No statin on med list, + no fill hx, although OP records have listed as Atorvastatin  80 mg. Resume on Atorvastatin   History left AKA  from neck Fash CKD 2-3: Baseline creatinine approximately 1.2 Persistent A-fib: Continue home Eliquis , diltiazem  History anemia chronic disease: Check iron panel, B12  OSA: CPAP at night  Body mass index is 37.31 kg/m. Obesity class II affecting medical care per above  DVT prophylaxis:  Eliquis  Code Status:  Full Code Diet:  Diet Orders (From admission, onward)     Start     Ordered   09/02/23 2049  Diet 2 gram sodium Room service appropriate? Yes; Fluid consistency: Thin  Diet effective now       Question Answer Comment  Room service appropriate? Yes   Fluid consistency: Thin      09/02/23 2051           Family Communication: Yes discussed with family at the bedside Consults: None Admission status:   Inpatient, Telemetry bed  Severity of Illness: The appropriate patient status for this patient is INPATIENT. Inpatient status is judged to be reasonable and necessary in order to provide the required intensity of service to ensure the patient's safety. The patient's presenting symptoms, physical exam findings, and initial radiographic and laboratory data in the context of their chronic comorbidities is felt to place them at high risk for further clinical deterioration. Furthermore, it is not anticipated that the patient will be medically stable for discharge from the hospital within 2 midnights of admission.   * I certify that at the point of admission it is my clinical judgment that the patient will require inpatient hospital care spanning beyond 2 midnights from the point of admission due to high intensity of service, high risk for further deterioration and high frequency of surveillance required.*   Dorn Dawson, MD [facility]  How to contact the [facility] Attending or Consulting provider 7A - 7P or covering provider during after hours 7P -7A, for this patient.  Check the care team in [facility] and look for a) attending/consulting [facility] provider listed and b) the [facility] team  listed Log into [email or web address] and use [place]'s universal password to access. If you do not have the password, please contact the hospital operator. Locate the [facility] provider you are looking for under [facility] and page to a number that you can be directly reached. If you still have difficulty reaching the provider, please page the [facility] (Director on Call) for the Hospitalists listed on amion for assistance.  09/02/2023, 8:54 PM

## 2023-09-02 NOTE — ED Provider Triage Note (Signed)
 Emergency Medicine Provider Triage Evaluation Note  Connor [name] , a 68 y.o. male  was evaluated in triage.  Pt complains of Who presents emergency department with a chief complaint of shortness of breath..  History of CHF recent admission.  He has a little bit of chest pain on the right side.  States that he had his Lasix  up to at his last admission to 80 mg daily but has had decreased urinary output and was sent in by his PCP.  Review of Systems  Positive: Shortness of breath Negative: Fever  Physical Exam  BP (!) 170/76 (BP Location: Right Arm)   Pulse 66   Temp 98.3 F (36.8 C) (Oral)   Resp (!) 21   Ht 5' 10 (1.778 m)   Wt 110.8 kg   SpO2 90%   BMI 35.05 kg/m  Gen:   Awake, no distress   Resp:  Normal effort  MSK:   Moves extremities without difficulty  Other:    Medical Decision Making  Medically screening exam initiated at 5:08 PM.  Appropriate orders placed.  Connor [name] was informed that the remainder of the evaluation will be completed by another provider, this initial triage assessment does not replace that evaluation, and the importance of remaining in the ED until their evaluation is complete.     Arloa Chroman, PA-C 09/02/23 1709

## 2023-09-02 NOTE — ED Triage Notes (Signed)
 Pt c/o shortness of breath with chest tightness and bilateral lower edema. Pt states nothing is changing since last admission on 08/09/24. Pt states he has been taking his fluid medications like he was suppose to.

## 2023-09-02 NOTE — ED Provider Notes (Signed)
 [place] EMERGENCY DEPARTMENT AT [facility] Provider Note  CSN: [id number] Arrival date & time: 09/02/23 1620  Chief Complaint(s) Chest Pain  HPI Connor [name] is a 68 y.o. male history of aortic aneurysm, dissection status postrepair, CKD, obesity, prior left AKA, presented to the emergency department with shortness of breath.  Patient reports shortness of breath which has been worsening over the past few days.  Reports associated orthopnea, cough.  Has had some anterior chest pain, no radiation.  No lightheadedness or dizziness.  No fainting or syncope.  No nausea or vomiting.  He reports he has been compliant with his medications since leaving the hospital but has worsened this.   Past Medical History Past Medical History:  Diagnosis Date   AAA (abdominal aortic aneurysm) (HCC)    History of open heart surgery    Hypertension    Seroma due to trauma [facility])    Patient Active Problem List   Diagnosis Date Noted   Acute exacerbation of CHF (congestive heart failure) (HCC) 09/02/2023   Acute on chronic heart failure with preserved ejection fraction (HFpEF) (HCC) 08/11/2023   Chronic kidney disease, stage 3a (HCC) 08/11/2023   Class 2 obesity 08/11/2023   Acute CHF (congestive heart failure) (HCC) 08/10/2023   Acute pulmonary edema (HCC)    Acute on chronic diastolic (congestive) heart failure (HCC) 12/08/2020   Acute on chronic diastolic CHF (congestive heart failure) (HCC) 12/07/2020   Paroxysmal A-fib (HCC) 10/10/2020   Gastritis 10/10/2020   Dyspepsia 10/07/2020   CKD stage G2/A2, GFR 60-89 and albumin creatinine ratio 30-299 mg/g 10/07/2020   Atypical chest pain 10/06/2020   Acute blood loss anemia    Essential hypertension    Postoperative pain    Severe anemia 09/19/2020   History of left above knee amputation (HCC) 09/19/2020   Left above-knee amputee (HCC) 09/19/2020   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)    CKD (chronic kidney  disease), stage III (HCC) 09/01/2020   Morbid obesity (HCC) 09/01/2020   Severe sepsis with acute organ dysfunction due to Streptococcus species (HCC) 08/30/2020   Cellulitis of left leg 08/30/2020   Lactic acidosis 08/30/2020   Elevated troponin 08/30/2020   Hypoalbuminemia 08/30/2020   Leukocytosis 08/30/2020   Thrombocytopenia (HCC) 08/30/2020   Hypokalemia 08/30/2020   Hyperglycemia 08/30/2020   Transaminitis 08/30/2020   Total bilirubin, elevated 08/30/2020   AKI (acute kidney injury) (HCC) 08/30/2020   Nausea vomiting and diarrhea 08/30/2020   Hypertension 05/20/2020   Persistent atrial fibrillation (HCC)    Home Medication(s) Prior to Admission medications   Medication Sig Start Date End Date Taking? Authorizing Provider  acetaminophen  (TYLENOL ) 325 MG tablet Take 2 tablets (650 mg total) by mouth every 6 (six) hours as needed for moderate pain. 10/10/20  Yes Arrien, Elidia Sieving, MD  aspirin  EC 81 MG tablet Take 81 mg by mouth daily. Swallow whole.   Yes [provider]  diltiazem  (CARDIZEM  CD) 240 MG 24 hr capsule TAKE 1 CAPSULE (240 MG TOTAL) BY MOUTH DAILY. 09/29/20 09/02/23 Yes Angiulli, Sieving PARAS, PA-C  ELIQUIS  5 MG TABS tablet TAKE 1 TABLET (5 MG TOTAL) BY MOUTH 2 (TWO) TIMES DAILY. 09/29/20 09/02/23 Yes Angiulli, Sieving PARAS, PA-C  furosemide  (LASIX ) 40 MG tablet Take 1 tablet (40 mg total) by mouth 2 (two) times daily. 08/13/23  Yes Johnson, Clanford L, MD  gabapentin  (NEURONTIN ) 400 MG capsule TAKE 1 CAPSULE (400 MG TOTAL) BY MOUTH THREE TIMES DAILY. 09/29/20 09/02/23 Yes Angiulli, Sieving PARAS,  PA-C  hydrALAZINE  (APRESOLINE ) 50 MG tablet Take 1 tablet by mouth in the morning, at noon, and at bedtime.   Yes [provider]  isosorbide  mononitrate (IMDUR ) 30 MG 24 hr tablet Take 1 tablet (30 mg total) by mouth daily. 12/10/20  Yes Ricky Fines, MD  labetalol  (NORMODYNE ) 200 MG tablet TAKE 2 TABLETS (400 MG TOTAL) BY MOUTH TWO TIMES DAILY. 09/29/20 09/02/23 Yes Angiulli,  Toribio PARAS, PA-C  lisinopril  (ZESTRIL ) 20 MG tablet Take 20 mg by mouth daily.   Yes [provider]  Multiple Vitamins-Minerals (MULTIVITAMIN WITH MINERALS) tablet Take 1 tablet by mouth daily. Men   Yes [provider]  potassium chloride  SA (KLOR-CON  M) 20 MEQ tablet Take 20 mEq by mouth daily.   Yes [provider]  spironolactone  (ALDACTONE ) 50 MG tablet Take 1 tablet (50 mg total) by mouth daily. 08/15/21  Yes Kate Lonni CROME, MD  traZODone  (DESYREL ) 100 MG tablet Take 100 mg by mouth at bedtime. 11/28/20  Yes [provider]                                                                                                                                    Past Surgical History Past Surgical History:  Procedure Laterality Date   ABDOMINAL AORTIC ANEURYSM REPAIR     AMPUTATION Left 09/07/2020   Procedure: ATTEMPTED LEFT LEG DEBRIDEMENT FASCIOTOMIES, APPLY INSTILLATION WOUND VAC, ABOVE KNEE AMPUTATION;  Surgeon: Harden Jerona GAILS, MD;  Location: [facility] OR;  Service: Orthopedics;  Laterality: Left;   BUBBLE STUDY  09/05/2020   Procedure: BUBBLE STUDY;  Surgeon: Hobart Powell BRAVO, MD;  Location: [facility] ENDOSCOPY;  Service: Cardiovascular;;   CARDIOVERSION N/A 02/11/2020   Procedure: CARDIOVERSION;  Surgeon: Kate Lonni CROME, MD;  Location: [facility] ENDOSCOPY;  Service: Cardiovascular;  Laterality: N/A;   CORONARY ARTERY BYPASS GRAFT     STUMP REVISION Left 09/09/2020   Procedure: REVISION LEFT ABOVE KNEE AMPUTATION;  Surgeon: Harden Jerona GAILS, MD;  Location: [facility] OR;  Service: Orthopedics;  Laterality: Left;   TEE WITHOUT CARDIOVERSION N/A 09/05/2020   Procedure: TRANSESOPHAGEAL ECHOCARDIOGRAM (TEE);  Surgeon: Hobart Powell BRAVO, MD;  Location: [facility] ENDOSCOPY;  Service: Cardiovascular;  Laterality: N/A;   Family History Family History  Problem Relation Age of Onset   CAD Mother    CVA Father     Social History Social History   Tobacco Use   Smoking status:  Former    Current packs/day: 0.00    Types: Cigarettes    Quit date: 07/26/2023    Years since quitting: 0.1   Smokeless tobacco: Never  Vaping Use   Vaping status: Never Used  Substance Use Topics   Alcohol use: Not Currently    Comment: occ   Drug use: Never   Allergies Patient has no known allergies.  Review of Systems Review of Systems  All other systems reviewed and are negative.   Physical Exam  Vital Signs  I have reviewed the triage vital signs BP (!) 153/90   Pulse 70   Temp 98.2 F (36.8 C) (Oral)   Resp (!) 22   Ht 5' 10 (1.778 m)   Wt 117.9 kg   SpO2 95%   BMI 37.31 kg/m  Physical Exam Vitals and nursing note reviewed.  Constitutional:      General: He is not in acute distress.    Appearance: Normal appearance.  HENT:     Mouth/Throat:     Mouth: Mucous membranes are moist.  Eyes:     Conjunctiva/sclera: Conjunctivae normal.  Neck:     Vascular: Hepatojugular reflux and JVD present.  Cardiovascular:     Rate and Rhythm: Normal rate and regular rhythm.  Pulmonary:     Effort: Tachypnea, accessory muscle usage and respiratory distress present.     Breath sounds: Examination of the right-lower field reveals decreased breath sounds. Examination of the left-lower field reveals decreased breath sounds. Decreased breath sounds present.  Abdominal:     General: Abdomen is flat.     Palpations: Abdomen is soft.     Tenderness: There is no abdominal tenderness.  Musculoskeletal:     Right lower leg: Edema present.     Comments: Left AKA  Skin:    General: Skin is warm and dry.     Capillary Refill: Capillary refill takes less than 2 seconds.  Neurological:     Mental Status: He is alert and oriented to person, place, and time. Mental status is at baseline.  Psychiatric:        Mood and Affect: Mood normal.        Behavior: Behavior normal.     ED Results and Treatments Labs (all labs ordered are listed, but only abnormal results are  displayed) Labs Reviewed  CBC - Abnormal; Notable for the following components:      Result Value   RBC 3.27 (*)    Hemoglobin 9.6 (*)    HCT 31.8 (*)    RDW 16.0 (*)    All other components within normal limits  BRAIN NATRIURETIC PEPTIDE - Abnormal; Notable for the following components:   B Natriuretic Peptide 132.0 (*)    All other components within normal limits  BASIC METABOLIC PANEL - Abnormal; Notable for the following components:   Potassium 3.3 (*)    Calcium  8.5 (*)    All other components within normal limits  TROPONIN I (HIGH SENSITIVITY) - Abnormal; Notable for the following components:   Troponin I (High Sensitivity) 24 (*)    All other components within normal limits  TROPONIN I (HIGH SENSITIVITY) - Abnormal; Notable for the following components:   Troponin I (High Sensitivity) 27 (*)    All other components within normal limits  MAGNESIUM   BASIC METABOLIC PANEL  MAGNESIUM   PHOSPHORUS  Radiology DG Chest 2 View Result Date: 09/02/2023 CLINICAL DATA:  Heart failure, chest pain EXAM: CHEST - 2 VIEW COMPARISON:  08/10/2023 FINDINGS: Gross cardiomegaly. Status post median sternotomy as well as stent endograft repair of the aortic arch and descending thoracic aorta, unchanged in appearance. Pulmonary vascular prominence and mild diffuse interstitial opacity, similar to prior. No acute osseous findings. IMPRESSION: 1. Gross cardiomegaly with pulmonary vascular prominence and mild diffuse interstitial opacity, similar to prior, consistent with edema. No new or focal airspace opacity. 2. Status post median sternotomy as well as stent endograft repair of the aortic arch and descending thoracic aorta, unchanged in appearance. Electronically Signed   By: Marolyn JONETTA Jaksch M.D.   On: 09/02/2023 17:39    Pertinent labs & imaging results that were available during my  care of the patient were reviewed by me and considered in my medical decision making (see MDM for details).  Medications Ordered in ED Medications  sodium chloride  flush (NS) 0.9 % injection 3 mL (3 mLs Intravenous Given 09/02/23 2206)  furosemide  (LASIX ) injection 80 mg (has no administration in time range)  aspirin  EC tablet 81 mg (has no administration in time range)  diltiazem  (CARDIZEM  CD) 24 hr capsule 240 mg (has no administration in time range)  apixaban  (ELIQUIS ) tablet 5 mg (5 mg Oral Given 09/02/23 2204)  gabapentin  (NEURONTIN ) capsule 400 mg (400 mg Oral Given 09/02/23 2204)  hydrALAZINE  (APRESOLINE ) tablet 50 mg (50 mg Oral Given 09/02/23 2204)  isosorbide  mononitrate (IMDUR ) 24 hr tablet 30 mg (has no administration in time range)  labetalol  (NORMODYNE ) tablet 400 mg (400 mg Oral Given 09/02/23 2204)  lisinopril  (ZESTRIL ) tablet 20 mg (has no administration in time range)  spironolactone  (ALDACTONE ) tablet 50 mg (has no administration in time range)  traZODone  (DESYREL ) tablet 100 mg (100 mg Oral Given 09/02/23 2203)  furosemide  (LASIX ) injection 80 mg (80 mg Intravenous Given 09/02/23 2017)  potassium chloride  SA (KLOR-CON  M) CR tablet 40 mEq (40 mEq Oral Given 09/02/23 2016)  oxyCODONE -acetaminophen  (PERCOCET/ROXICET) 5-325 MG per tablet 1 tablet (1 tablet Oral Given 09/02/23 2019)                                                                                                                                     Procedures .Critical Care  Performed by: Francesca Elsie CROME, MD Authorized by: Francesca Elsie CROME, MD   Critical care provider statement:    Critical care time (minutes):  30   Critical care was necessary to treat or prevent imminent or life-threatening deterioration of the following conditions:  Respiratory failure   Critical care was time spent personally by me on the following activities:  Development of treatment plan with patient or surrogate, discussions with consultants,  evaluation of patient's response to treatment, examination of patient, ordering and review of laboratory studies, ordering and review of radiographic studies, ordering and performing treatments and interventions, pulse oximetry, re-evaluation  of patient's condition and review of old charts   Care discussed with: admitting provider     (including critical care time)  Medical Decision Making / ED Course   MDM:  68 year old male presenting to the emergency department shortness of breath.  Physical exam, patient has diminished basilar breath sounds, respiratory distress, leg swelling concerning for CHF exacerbation.  Patient was hypoxic on room air and placed on 2 L nasal cannula with improvement.  Will give Lasix .  Patient did have some chest pain, EKG without evidence of acute changes, his troponin is mildly elevated and stable on repeat, suspect demand, low concern for ACS.  He does report medication compliance so unclear what reason why patient is having persistent symptoms.  No cough or fevers to suggest any underlying infection or pneumonia.  Doubt PE as patient is on chronic anticoagulation and reports medication compliance.  Discussed with hospitalist Dr. Segars to admit the patient for further management.      Additional history obtained: -Additional history obtained from family -External records from outside source obtained and reviewed including: Chart review including previous notes, labs, imaging, consultation notes including prior admission   Lab Tests: -I ordered, reviewed, and interpreted labs.   The pertinent results include:   Labs Reviewed  CBC - Abnormal; Notable for the following components:      Result Value   RBC 3.27 (*)    Hemoglobin 9.6 (*)    HCT 31.8 (*)    RDW 16.0 (*)    All other components within normal limits  BRAIN NATRIURETIC PEPTIDE - Abnormal; Notable for the following components:   B Natriuretic Peptide 132.0 (*)    All other components within  normal limits  BASIC METABOLIC PANEL - Abnormal; Notable for the following components:   Potassium 3.3 (*)    Calcium  8.5 (*)    All other components within normal limits  TROPONIN I (HIGH SENSITIVITY) - Abnormal; Notable for the following components:   Troponin I (High Sensitivity) 24 (*)    All other components within normal limits  TROPONIN I (HIGH SENSITIVITY) - Abnormal; Notable for the following components:   Troponin I (High Sensitivity) 27 (*)    All other components within normal limits  MAGNESIUM   BASIC METABOLIC PANEL  MAGNESIUM   PHOSPHORUS    Notable for mild troponin elevation, likely demand related, mild elevated BNP   EKG   EKG Interpretation Date/Time:  Monday September 02 2023 16:42:55 EST Ventricular Rate:  70 PR Interval:  212 QRS Duration:  96 QT Interval:  392 QTC Calculation: 423 R Axis:   -80  Text Interpretation: Sinus rhythm with 1st degree A-V block Left axis deviation Pulmonary disease pattern Incomplete right bundle branch block Abnormal ECG Confirmed by Francesca Fallow ([id number]) on 09/02/2023 6:53:40 PM         Imaging Studies ordered: I ordered imaging studies including CXR On my interpretation imaging demonstrates CHF  I independently visualized and interpreted imaging. I agree with the radiologist interpretation   Medicines ordered and prescription drug management: Meds ordered this encounter  Medications   furosemide  (LASIX ) injection 80 mg   potassium chloride  SA (KLOR-CON  M) CR tablet 40 mEq   oxyCODONE -acetaminophen  (PERCOCET/ROXICET) 5-325 MG per tablet 1 tablet    Refill:  0   sodium chloride  flush (NS) 0.9 % injection 3 mL   furosemide  (LASIX ) injection 80 mg   aspirin  EC tablet 81 mg    Swallow whole.     diltiazem  (CARDIZEM  CD) 24 hr  capsule 240 mg   apixaban  (ELIQUIS ) tablet 5 mg   gabapentin  (NEURONTIN ) capsule 400 mg   hydrALAZINE  (APRESOLINE ) tablet 50 mg   isosorbide  mononitrate (IMDUR ) 24 hr tablet 30 mg   labetalol   (NORMODYNE ) tablet 400 mg   lisinopril  (ZESTRIL ) tablet 20 mg   spironolactone  (ALDACTONE ) tablet 50 mg   traZODone  (DESYREL ) tablet 100 mg    -I have reviewed the patients home medicines and have made adjustments as needed   Consultations Obtained: I requested consultation with the hospitalist,  and discussed lab and imaging findings as well as pertinent plan - they recommend: admission   Cardiac Monitoring: The patient was maintained on a cardiac monitor.  I personally viewed and interpreted the cardiac monitored which showed an underlying rhythm of: NSR  Social Determinants of Health:  Diagnosis or treatment significantly limited by social determinants of health: former smoker   Reevaluation: After the interventions noted above, I reevaluated the patient and found that their symptoms have improved  Co morbidities that complicate the patient evaluation  Past Medical History:  Diagnosis Date   AAA (abdominal aortic aneurysm) (HCC)    History of open heart surgery    Hypertension    Seroma due to trauma [facility])       Dispostion: Disposition decision including need for hospitalization was considered, and patient admitted to the hospital.    Final Clinical Impression(s) / ED Diagnoses Final diagnoses:  Acute diastolic congestive heart failure (HCC)     This chart was dictated using voice recognition software.  Despite best efforts to proofread,  errors can occur which can change the documentation meaning.    Francesca Elsie CROME, MD 09/02/23 [phone]

## 2023-09-03 DIAGNOSIS — Z79899 Other long term (current) drug therapy: Secondary | ICD-10-CM | POA: Diagnosis not present

## 2023-09-03 DIAGNOSIS — E66812 Obesity, class 2: Secondary | ICD-10-CM | POA: Diagnosis present

## 2023-09-03 DIAGNOSIS — I5031 Acute diastolic (congestive) heart failure: Secondary | ICD-10-CM | POA: Diagnosis not present

## 2023-09-03 DIAGNOSIS — Z8679 Personal history of other diseases of the circulatory system: Secondary | ICD-10-CM | POA: Diagnosis not present

## 2023-09-03 DIAGNOSIS — E876 Hypokalemia: Secondary | ICD-10-CM | POA: Diagnosis present

## 2023-09-03 DIAGNOSIS — Z7901 Long term (current) use of anticoagulants: Secondary | ICD-10-CM | POA: Diagnosis not present

## 2023-09-03 DIAGNOSIS — I71019 Dissection of thoracic aorta, unspecified: Secondary | ICD-10-CM | POA: Diagnosis present

## 2023-09-03 DIAGNOSIS — Z7982 Long term (current) use of aspirin: Secondary | ICD-10-CM | POA: Diagnosis not present

## 2023-09-03 DIAGNOSIS — N182 Chronic kidney disease, stage 2 (mild): Secondary | ICD-10-CM | POA: Diagnosis present

## 2023-09-03 DIAGNOSIS — Z8249 Family history of ischemic heart disease and other diseases of the circulatory system: Secondary | ICD-10-CM | POA: Diagnosis not present

## 2023-09-03 DIAGNOSIS — N179 Acute kidney failure, unspecified: Secondary | ICD-10-CM | POA: Diagnosis present

## 2023-09-03 DIAGNOSIS — Z6837 Body mass index (BMI) 37.0-37.9, adult: Secondary | ICD-10-CM | POA: Diagnosis not present

## 2023-09-03 DIAGNOSIS — I4819 Other persistent atrial fibrillation: Secondary | ICD-10-CM | POA: Diagnosis present

## 2023-09-03 DIAGNOSIS — I1A0 Resistant hypertension: Secondary | ICD-10-CM | POA: Diagnosis present

## 2023-09-03 DIAGNOSIS — I130 Hypertensive heart and chronic kidney disease with heart failure and stage 1 through stage 4 chronic kidney disease, or unspecified chronic kidney disease: Secondary | ICD-10-CM | POA: Diagnosis present

## 2023-09-03 DIAGNOSIS — Z823 Family history of stroke: Secondary | ICD-10-CM | POA: Diagnosis not present

## 2023-09-03 DIAGNOSIS — I509 Heart failure, unspecified: Secondary | ICD-10-CM | POA: Diagnosis present

## 2023-09-03 DIAGNOSIS — Z89612 Acquired absence of left leg above knee: Secondary | ICD-10-CM | POA: Diagnosis not present

## 2023-09-03 DIAGNOSIS — I5033 Acute on chronic diastolic (congestive) heart failure: Secondary | ICD-10-CM | POA: Diagnosis present

## 2023-09-03 DIAGNOSIS — Z87891 Personal history of nicotine dependence: Secondary | ICD-10-CM | POA: Diagnosis not present

## 2023-09-03 DIAGNOSIS — I4510 Unspecified right bundle-branch block: Secondary | ICD-10-CM | POA: Diagnosis present

## 2023-09-03 DIAGNOSIS — I2510 Atherosclerotic heart disease of native coronary artery without angina pectoris: Secondary | ICD-10-CM | POA: Diagnosis present

## 2023-09-03 DIAGNOSIS — I5A Non-ischemic myocardial injury (non-traumatic): Secondary | ICD-10-CM | POA: Diagnosis present

## 2023-09-03 DIAGNOSIS — R001 Bradycardia, unspecified: Secondary | ICD-10-CM | POA: Diagnosis present

## 2023-09-03 DIAGNOSIS — Z951 Presence of aortocoronary bypass graft: Secondary | ICD-10-CM | POA: Diagnosis not present

## 2023-09-03 DIAGNOSIS — G4733 Obstructive sleep apnea (adult) (pediatric): Secondary | ICD-10-CM | POA: Diagnosis present

## 2023-09-03 LAB — PHOSPHORUS: Phosphorus: 4.3 mg/dL (ref 2.5–4.6)

## 2023-09-03 LAB — IRON AND TIBC
Iron: 40 ug/dL — ABNORMAL LOW (ref 45–182)
Saturation Ratios: 10 % — ABNORMAL LOW (ref 17.9–39.5)
TIBC: 405 ug/dL (ref 250–450)
UIBC: 365 ug/dL

## 2023-09-03 LAB — VITAMIN B12: Vitamin B-12: 407 pg/mL (ref 180–914)

## 2023-09-03 LAB — BASIC METABOLIC PANEL
Anion gap: 6 (ref 5–15)
BUN: 21 mg/dL (ref 8–23)
CO2: 32 mmol/L (ref 22–32)
Calcium: 8.6 mg/dL — ABNORMAL LOW (ref 8.9–10.3)
Chloride: 103 mmol/L (ref 98–111)
Creatinine, Ser: 1.47 mg/dL — ABNORMAL HIGH (ref 0.61–1.24)
GFR, Estimated: 52 mL/min — ABNORMAL LOW (ref >60)
Glucose, Bld: 94 mg/dL (ref 70–99)
Potassium: 3.4 mmol/L — ABNORMAL LOW (ref 3.5–5.1)
Sodium: 141 mmol/L (ref 135–145)

## 2023-09-03 LAB — TRANSFERRIN: Transferrin: 289 mg/dL (ref 180–329)

## 2023-09-03 LAB — FERRITIN: Ferritin: 41 ng/mL (ref 24–336)

## 2023-09-03 LAB — MAGNESIUM: Magnesium: 2.2 mg/dL (ref 1.7–2.4)

## 2023-09-03 MED ORDER — ATORVASTATIN CALCIUM 40 MG PO TABS
80.0000 mg | ORAL_TABLET | Freq: Every day | ORAL | Status: DC
  Administered 2023-09-03 – 2023-09-10 (×8): 80 mg via ORAL
  Filled 2023-09-03 (×9): qty 2

## 2023-09-03 MED ORDER — OXYCODONE-ACETAMINOPHEN 5-325 MG PO TABS
1.0000 | ORAL_TABLET | Freq: Four times a day (QID) | ORAL | Status: DC | PRN
  Administered 2023-09-03 – 2023-09-09 (×10): 1 via ORAL
  Filled 2023-09-03 (×10): qty 1

## 2023-09-03 MED ORDER — OXYCODONE HCL 5 MG PO TABS
5.0000 mg | ORAL_TABLET | Freq: Once | ORAL | Status: AC
  Administered 2023-09-03: 5 mg via ORAL
  Filled 2023-09-03: qty 1

## 2023-09-03 MED ORDER — TORSEMIDE 20 MG PO TABS
40.0000 mg | ORAL_TABLET | Freq: Every day | ORAL | Status: DC
  Administered 2023-09-03 – 2023-09-04 (×2): 40 mg via ORAL
  Filled 2023-09-03 (×2): qty 2

## 2023-09-03 MED ORDER — POTASSIUM CHLORIDE CRYS ER 20 MEQ PO TBCR
40.0000 meq | EXTENDED_RELEASE_TABLET | Freq: Two times a day (BID) | ORAL | Status: DC
  Administered 2023-09-03 – 2023-09-10 (×15): 40 meq via ORAL
  Filled 2023-09-03 (×10): qty 2
  Filled 2023-09-03: qty 4
  Filled 2023-09-03 (×4): qty 2

## 2023-09-03 NOTE — TOC Initial Note (Signed)
 Transition of Care [facility]) - Initial/Assessment Note    Patient Details  Name: Connor [name] MRN: [id number] Date of Birth: 1956/03/28  Transition of Care [facility]) CM/SW Contact:    Rollo Petri, LCSW Phone Number: 09/03/2023, 3:02 PM  Clinical Narrative:                   [facility] consulted for CHF screen. Pt known to TOC from recent admission (3weeks ago).   Pt lives with family. Pt has transportation to appointments. Pt has had HH in the past. Pt has a walker and a wheelchair to use in the home. Pt states that he takes medications daily and tries to follow a heart healthy diet. Pt states that he weighs every other day.   CHF education added to AVS. TOC to follow.   Expected Discharge Plan: Home/Self Care Barriers to Discharge: Continued Medical Work up   Patient Goals and CMS Choice Patient states their goals for this hospitalization and ongoing recovery are:: return home          Expected Discharge Plan and Services In-house Referral: Clinical Social Work     Living arrangements for the past 2 months: Single Family Home                                      Prior Living Arrangements/Services Living arrangements for the past 2 months: Single Family Home Lives with:: Relatives Patient language and need for interpreter reviewed:: Yes Do you feel safe going back to the place where you live?: Yes      Need for Family Participation in Patient Care: Yes (Comment) Care giver support system in place?: Yes (comment) Current home services: DME Criminal Activity/Legal Involvement Pertinent to Current Situation/Hospitalization: No - Comment as needed  Activities of Daily Living      Permission Sought/Granted                  Emotional Assessment Appearance:: Appears stated age Attitude/Demeanor/Rapport: Engaged Affect (typically observed): Accepting Orientation: : Oriented to Self, Oriented to Place, Oriented to  Time, Oriented to Situation Alcohol / Substance  Use: Not Applicable Psych Involvement: No (comment)  Admission diagnosis:  Acute exacerbation of CHF (congestive heart failure) (HCC) [I50.9] Acute diastolic congestive heart failure (HCC) [I50.31] Patient Active Problem List   Diagnosis Date Noted   Acute exacerbation of CHF (congestive heart failure) (HCC) 09/02/2023   Acute on chronic heart failure with preserved ejection fraction (HFpEF) (HCC) 08/11/2023   Chronic kidney disease, stage 3a (HCC) 08/11/2023   Class 2 obesity 08/11/2023   Acute CHF (congestive heart failure) (HCC) 08/10/2023   Acute pulmonary edema (HCC)    Acute on chronic diastolic (congestive) heart failure (HCC) 12/08/2020   Acute on chronic diastolic CHF (congestive heart failure) (HCC) 12/07/2020   Paroxysmal A-fib (HCC) 10/10/2020   Gastritis 10/10/2020   Dyspepsia 10/07/2020   CKD stage G2/A2, GFR 60-89 and albumin creatinine ratio 30-299 mg/g 10/07/2020   Atypical chest pain 10/06/2020   Acute blood loss anemia    Essential hypertension    Postoperative pain    Severe anemia 09/19/2020   History of left above knee amputation (HCC) 09/19/2020   Left above-knee amputee (HCC) 09/19/2020   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)    CKD (chronic kidney disease), stage III (HCC) 09/01/2020   Morbid obesity (HCC) 09/01/2020   Severe sepsis with acute organ dysfunction due  to Streptococcus species (HCC) 08/30/2020   Cellulitis of left leg 08/30/2020   Lactic acidosis 08/30/2020   Elevated troponin 08/30/2020   Hypoalbuminemia 08/30/2020   Leukocytosis 08/30/2020   Thrombocytopenia (HCC) 08/30/2020   Hypokalemia 08/30/2020   Hyperglycemia 08/30/2020   Transaminitis 08/30/2020   Total bilirubin, elevated 08/30/2020   AKI (acute kidney injury) (HCC) 08/30/2020   Nausea vomiting and diarrhea 08/30/2020   Hypertension 05/20/2020   Persistent atrial fibrillation (HCC)    PCP:  Myra Geni ORN, FNP Pharmacy:   [facility] -  [place], [place] - [address] [place] [address] Phone: [phone] Fax: [phone]  [facility] [place] - [place], [place] - [address] [place] [address] Phone: [phone] Fax: [phone]     Social Drivers of Health (SDOH) Social History: SDOH Screenings   Food Insecurity: No Food Insecurity (08/10/2023)  Housing: Low Risk  (08/10/2023)  Transportation Needs: No Transportation Needs (08/10/2023)  Utilities: At Risk (08/10/2023)  Financial Resource Strain: Low Risk  (02/06/2021)   Received from [facility]  Tobacco Use: Medium Risk (09/02/2023)   SDOH Interventions:     Readmission Risk Interventions     No data to display

## 2023-09-03 NOTE — ED Notes (Signed)
 ED TO INPATIENT HANDOFF REPORT  ED Nurse Name and Phone #: Warren RN  S Name/Age/Gender Connor [name] 68 y.o. male Room/Bed: APA02/APA02  Code Status   Code Status: Full Code  Home/SNF/Other Home Patient oriented to: self, place, time, and situation Is this baseline? Yes   Triage Complete: Triage complete  Chief Complaint Acute exacerbation of CHF (congestive heart failure) (HCC) [I50.9]  Triage Note Pt c/o shortness of breath with chest tightness and bilateral lower edema. Pt states nothing is changing since last admission on 08/09/24. Pt states he has been taking his fluid medications like he was suppose to.    Allergies No Known Allergies  Level of Care/Admitting Diagnosis ED Disposition     ED Disposition  Admit   Condition  --   Comment  Hospital Area: [facility] [[id number]]  Level of Care: Telemetry [5]  Covid Evaluation: Asymptomatic - no recent exposure (last 10 days) testing not required  Diagnosis: Acute exacerbation of CHF (congestive heart failure) [facility]) [[id number]]  Admitting Physician: SEGARS, JONATHAN [[id number]]  Attending Physician: SEGARS, JONATHAN [[id number]]  Certification:: I certify this patient will need inpatient services for at least 2 midnights  Expected Medical Readiness: 09/06/2023          B Medical/Surgery History Past Medical History:  Diagnosis Date   AAA (abdominal aortic aneurysm) (HCC)    History of open heart surgery    Hypertension    Seroma due to trauma [facility])    Past Surgical History:  Procedure Laterality Date   ABDOMINAL AORTIC ANEURYSM REPAIR     AMPUTATION Left 09/07/2020   Procedure: ATTEMPTED LEFT LEG DEBRIDEMENT FASCIOTOMIES, APPLY INSTILLATION WOUND VAC, ABOVE KNEE AMPUTATION;  Surgeon: Harden Jerona GAILS, MD;  Location: [facility] OR;  Service: Orthopedics;  Laterality: Left;   BUBBLE STUDY  09/05/2020   Procedure: BUBBLE STUDY;  Surgeon: Hobart Powell BRAVO, MD;  Location: [facility] ENDOSCOPY;  Service: Cardiovascular;;    CARDIOVERSION N/A 02/11/2020   Procedure: CARDIOVERSION;  Surgeon: Kate Lonni CROME, MD;  Location: [facility] ENDOSCOPY;  Service: Cardiovascular;  Laterality: N/A;   CORONARY ARTERY BYPASS GRAFT     STUMP REVISION Left 09/09/2020   Procedure: REVISION LEFT ABOVE KNEE AMPUTATION;  Surgeon: Harden Jerona GAILS, MD;  Location: [facility] OR;  Service: Orthopedics;  Laterality: Left;   TEE WITHOUT CARDIOVERSION N/A 09/05/2020   Procedure: TRANSESOPHAGEAL ECHOCARDIOGRAM (TEE);  Surgeon: Hobart Powell BRAVO, MD;  Location: [facility] ENDOSCOPY;  Service: Cardiovascular;  Laterality: N/A;     A IV Location/Drains/Wounds Patient Lines/Drains/Airways Status     Active Line/Drains/Airways     Name Placement date Placement time Site Days   Peripheral IV 09/02/23 20 G Left Antecubital 09/02/23  2340  Antecubital  1            Intake/Output Last 24 hours  Intake/Output Summary (Last 24 hours) at 09/03/2023 1251 Last data filed at 09/03/2023 0933 Gross per 24 hour  Intake 240 ml  Output 2500 ml  Net -2260 ml    Labs/Imaging Results for orders placed or performed during the hospital encounter of 09/02/23 (from the past 48 hours)  CBC     Status: Abnormal   Collection Time: 09/02/23  6:27 PM  Result Value Ref Range   WBC 5.4 4.0 - 10.5 K/uL   RBC 3.27 (L) 4.22 - 5.81 MIL/uL   Hemoglobin 9.6 (L) 13.0 - 17.0 g/dL   HCT 68.1 (L) 60.9 - 47.9 %   MCV 97.2 80.0 - 100.0 fL   MCH 29.4 26.0 -  34.0 pg   MCHC 30.2 30.0 - 36.0 g/dL   RDW 83.9 (H) 88.4 - 84.4 %   Platelets 155 150 - 400 K/uL   nRBC 0.0 0.0 - 0.2 %    Comment: Performed at [facility], [address], [place], [place] [address]  Brain natriuretic peptide     Status: Abnormal   Collection Time: 09/02/23  6:27 PM  Result Value Ref Range   B Natriuretic Peptide 132.0 (H) 0.0 - 100.0 pg/mL    Comment: Performed at [facility], [address] [place], [place] [address]  Troponin I (High Sensitivity)     Status: Abnormal   Collection Time: 09/02/23  6:27  PM  Result Value Ref Range   Troponin I (High Sensitivity) 24 (H) <18 ng/L    Comment: (NOTE) Elevated high sensitivity troponin I (hsTnI) values and significant  changes across serial measurements may suggest ACS but many other  chronic and acute conditions are known to elevate hsTnI results.  Refer to the Links section for chest pain algorithms and additional  guidance. Performed at [facility], [address] [place], [place] [address]   Basic metabolic panel     Status: Abnormal   Collection Time: 09/02/23  7:38 PM  Result Value Ref Range   Sodium 141 135 - 145 mmol/L   Potassium 3.3 (L) 3.5 - 5.1 mmol/L   Chloride 107 98 - 111 mmol/L   CO2 28 22 - 32 mmol/L   Glucose, Bld 99 70 - 99 mg/dL    Comment: Glucose reference range applies only to samples taken after fasting for at least 8 hours.   BUN 19 8 - 23 mg/dL   Creatinine, Ser 8.78 0.61 - 1.24 mg/dL   Calcium  8.5 (L) 8.9 - 10.3 mg/dL   GFR, Estimated >39 >39 mL/min    Comment: (NOTE) Calculated using the CKD-EPI Creatinine Equation (2021)    Anion gap 6 5 - 15    Comment: Performed at [facility], [address], [place], [place] [address]  Magnesium      Status: None   Collection Time: 09/02/23  7:38 PM  Result Value Ref Range   Magnesium  2.3 1.7 - 2.4 mg/dL    Comment: Performed at [facility], [address], [place], [place] [address]  Troponin I (High Sensitivity)     Status: Abnormal   Collection Time: 09/02/23  8:54 PM  Result Value Ref Range   Troponin I (High Sensitivity) 27 (H) <18 ng/L    Comment: (NOTE) Elevated high sensitivity troponin I (hsTnI) values and significant  changes across serial measurements may suggest ACS but many other  chronic and acute conditions are known to elevate hsTnI results.  Refer to the Links section for chest pain algorithms and additional  guidance. Performed at [facility], [address] [place], [place] [address]   Basic metabolic panel     Status: Abnormal    Collection Time: 09/03/23  4:35 AM  Result Value Ref Range   Sodium 141 135 - 145 mmol/L   Potassium 3.4 (L) 3.5 - 5.1 mmol/L   Chloride 103 98 - 111 mmol/L   CO2 32 22 - 32 mmol/L   Glucose, Bld 94 70 - 99 mg/dL    Comment: Glucose reference range applies only to samples taken after fasting for at least 8 hours.   BUN 21 8 - 23 mg/dL   Creatinine, Ser 8.52 (H) 0.61 - 1.24 mg/dL   Calcium  8.6 (L) 8.9 - 10.3  mg/dL   GFR, Estimated 52 (L) >60 mL/min    Comment: (NOTE) Calculated using the CKD-EPI Creatinine Equation (2021)    Anion gap 6 5 - 15    Comment: Performed at [facility], [address] [place], [place] [address]  Magnesium      Status: None   Collection Time: 09/03/23  4:35 AM  Result Value Ref Range   Magnesium  2.2 1.7 - 2.4 mg/dL    Comment: Performed at [facility], [address] [place], [place] [address]  Phosphorus     Status: None   Collection Time: 09/03/23  4:35 AM  Result Value Ref Range   Phosphorus 4.3 2.5 - 4.6 mg/dL    Comment: Performed at [facility], [address] [place], [place] [address]  Ferritin     Status: None   Collection Time: 09/03/23  4:35 AM  Result Value Ref Range   Ferritin 41 24 - 336 ng/mL    Comment: Performed at [facility], [address] [place], [place] [address]  Iron and TIBC     Status: Abnormal   Collection Time: 09/03/23  4:35 AM  Result Value Ref Range   Iron 40 (L) 45 - 182 ug/dL   TIBC 594 749 - 549 ug/dL   Saturation Ratios 10 (L) 17.9 - 39.5 %   UIBC 365 ug/dL    Comment: Performed at [facility], [address], [place], [place] [address]  Transferrin     Status: None   Collection Time: 09/03/23  4:35 AM  Result Value Ref Range   Transferrin 289 180 - 329 mg/dL    Comment: Performed at [facility], [address], [place], [place] [address]  Vitamin B12     Status: None   Collection Time: 09/03/23  4:35 AM  Result Value Ref Range   Vitamin B-12 407 180 - 914 pg/mL    Comment: (NOTE) This assay is not  validated for testing neonatal or myeloproliferative syndrome specimens for Vitamin B12 levels. Performed at [facility], [address] [place], [place] [address]    DG Chest 2 View Result Date: 09/02/2023 CLINICAL DATA:  Heart failure, chest pain EXAM: CHEST - 2 VIEW COMPARISON:  08/10/2023 FINDINGS: Gross cardiomegaly. Status post median sternotomy as well as stent endograft repair of the aortic arch and descending thoracic aorta, unchanged in appearance. Pulmonary vascular prominence and mild diffuse interstitial opacity, similar to prior. No acute osseous findings. IMPRESSION: 1. Gross cardiomegaly with pulmonary vascular prominence and mild diffuse interstitial opacity, similar to prior, consistent with edema. No new or focal airspace opacity. 2. Status post median sternotomy as well as stent endograft repair of the aortic arch and descending thoracic aorta, unchanged in appearance. Electronically Signed   By: Marolyn JONETTA Jaksch M.D.   On: 09/02/2023 17:39    Pending Labs Unresulted Labs (From admission, onward)     Start     Ordered   09/04/23 0500  Basic metabolic panel  Tomorrow morning,   R        09/03/23 1050   09/04/23 0500  Magnesium   Tomorrow morning,   R        09/03/23 1050            Vitals/Pain Today's Vitals   09/03/23 0935 09/03/23 1000 09/03/23 1030 09/03/23 1100  BP:  131/71 (!) 147/78 (!) 152/85  Pulse:  70 74 73  Resp:  14 13 16   Temp: 97.8 F (36.6 C)     TempSrc: Oral  SpO2:  94% 93% 94%  Weight:      Height:      PainSc:        Isolation Precautions No active isolations  Medications Medications  sodium chloride  flush (NS) 0.9 % injection 3 mL (3 mLs Intravenous Given 09/03/23 0930)  aspirin  EC tablet 81 mg (81 mg Oral Given 09/03/23 0929)  diltiazem  (CARDIZEM  CD) 24 hr capsule 240 mg (240 mg Oral Given 09/03/23 0927)  apixaban  (ELIQUIS ) tablet 5 mg (5 mg Oral Given 09/03/23 0926)  gabapentin  (NEURONTIN ) capsule 400 mg (400 mg Oral Given 09/03/23 0929)   hydrALAZINE  (APRESOLINE ) tablet 50 mg (50 mg Oral Given 09/03/23 0644)  isosorbide  mononitrate (IMDUR ) 24 hr tablet 30 mg (30 mg Oral Given 09/03/23 0929)  labetalol  (NORMODYNE ) tablet 400 mg (400 mg Oral Given 09/03/23 0929)  lisinopril  (ZESTRIL ) tablet 20 mg (20 mg Oral Given 09/03/23 0928)  spironolactone  (ALDACTONE ) tablet 50 mg (50 mg Oral Given 09/03/23 0927)  traZODone  (DESYREL ) tablet 100 mg (100 mg Oral Given 09/02/23 2203)  atorvastatin  (LIPITOR) tablet 80 mg (80 mg Oral Given 09/03/23 0927)  potassium chloride  SA (KLOR-CON  M) CR tablet 40 mEq (40 mEq Oral Given 09/03/23 0925)  torsemide  (DEMADEX ) tablet 40 mg (40 mg Oral Given 09/03/23 1127)  furosemide  (LASIX ) injection 80 mg (80 mg Intravenous Given 09/02/23 2017)  potassium chloride  SA (KLOR-CON  M) CR tablet 40 mEq (40 mEq Oral Given 09/02/23 2016)  oxyCODONE -acetaminophen  (PERCOCET/ROXICET) 5-325 MG per tablet 1 tablet (1 tablet Oral Given 09/02/23 2019)  furosemide  (LASIX ) injection 80 mg (80 mg Intravenous Given 09/03/23 0217)  oxyCODONE  (Oxy IR/ROXICODONE ) immediate release tablet 5 mg (5 mg Oral Given 09/03/23 0740)    Mobility walks with person assist     Focused Assessments Cardiac Assessment Handoff:  Cardiac Rhythm: Normal sinus rhythm Lab Results  Component Value Date   CKTOTAL 202 09/15/2020   Lab Results  Component Value Date   DDIMER 8.71 (H) 01/23/2023   Does the Patient currently have chest pain? No    R Recommendations: See Admitting Provider Note  Report given to:   Additional Notes: Left Above the Knee amputation.

## 2023-09-03 NOTE — Consult Note (Signed)
 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 Vascular vs ?  Pressure Injury POA: NA  Measurement: see nursing flowsheet  Wound bed: 75% red moist 25% tan fibrinous  Drainage (amount, consistency, odor) tan exudate noted on dressing  Periwound: intact, has appearance of previous edema to leg that is subsiding  Dressing procedure/placement/frequency: Clean R leg wound with Vashe wound cleanser Soila [phone]), apply Xeroform gauze (Lawson [phone]) to wound bed daily, cover Xeroform with ABD pad or Telfa nonstick pad then wrap leg with Kerlix roll gauze beginning right above toes and ending R below knee.  May secure with Ace bandage wrapped in same fashion as Kerlix for light compression.     Patient should follow with vascular surgeon or wound care center for continued care of this wound.  Patient appears to already be established with vascular surgery at [facility] for other conditions.    POC discussed with primary MD and bedside nurse.  WOC team will not follow. Re-consult if further needs arise.   Thank you,     Powell Bar MSN, RN-BC, [employer] [phone]

## 2023-09-03 NOTE — ED Notes (Signed)
 Nonstick dressing and abd pad with ace bandage placed to wound on patient's lower R leg

## 2023-09-03 NOTE — ED Notes (Signed)
 Wound care, cleansed with Vashe, Telfa applied, Gauze wrapped and ace bandage wrapped. Patient tolerated well.

## 2023-09-03 NOTE — Progress Notes (Signed)
 PROGRESS NOTE    Connor [name]  FMW:[id number] DOB: 04/27/1956 DOA: 09/02/2023 PCP: Myra Geni ORN, FNP   Brief Narrative:    Connor [name] is a 68 y.o. 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  He states that despite use of Lasix  80 mg twice daily he was not diuresing well at home.  He now appears to have diuresed with IV Lasix , but still has some mild volume overload in the setting of creatinine elevation.  He has been started on torsemide  40 mg daily and will need further close monitoring.  Assessment & Plan:   Principal Problem:   Acute exacerbation of CHF (congestive heart failure) (HCC)  Assessment and Plan:   Acute exacerbation heart failure with preserved ejection fraction Reports weight gain of approximately 10 pound to 253 lb since last discharge (dry approximately 244 lb = 110 kg).  He had recent TTE last admission on 12/15 demonstrating LVEF 60 to 65%, severe concentric LVH, grade 2 diastolic dysfunction, biatrial enlargement, elevated RA pressure. Etiology of his heart failure exacerbation appears to be inadequate Lasix  dose (Rx at discharge 40 mg BID, but reports even inadequate diuresis with 80 mg BID), increase fluid intake (2+ L per day), and failure to respond to increasing weights in the outpatient setting. - Stop IV Lasix  given creatinine bump and switch to torsemide  as ordered -Anticipate discharge in a.m. if tolerating this well and euvolemic -Wean oxygen to room air   Chronic myocardial injury Low-level elevation in high-sensitivity troponin at 24, has elevated trops similarly going back to 2022. EKG with no acute acute ischemic changes. - Repeat troponin.   Then stop trend likely related to heart failure management directed above   Right lower extremity wounds - Wound care consult -Picture in chart   Hypokalemia - Repleted -Recheck in a.m.   Chronic medical problems: Resistant hypertension: Continue home diltiazem , hydralazine , spironolactone , ISMN History of aortic dissection, endovascular repair: Hypertensive management per above, outpatient follow-up surveillance CAD status post CABG, PAD: Continue aspirin , ? No statin on med list, + no fill hx, although OP records have listed as Atorvastatin  80 mg. 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 Obesity class II affecting medical care per above    DVT prophylaxis:apixaban  Code Status: Full Family Communication: Cousin at bedside 1/7 Disposition Plan:  Status is: Inpatient Remains inpatient appropriate because: Need for further diuresis and close monitoring.   Consultants:  None  Procedures:  None  Antimicrobials:  None   Subjective: Patient seen and evaluated today with no new acute complaints or concerns. No acute concerns or events noted overnight.  He states he is starting to breathe easier, however still on oxygen.  Objective: Vitals:   09/03/23 0927 09/03/23 0928 09/03/23 0930 09/03/23 0935  BP: 129/76 129/76 (!) 126/100   Pulse:  81 83   Resp:   18   Temp:    97.8 F (36.6 C)  TempSrc:    Oral  SpO2:   96%   Weight:      Height:  Intake/Output Summary (Last 24 hours) at 09/03/2023 1051 Last data filed at 09/03/2023 0933 Gross per 24 hour  Intake 240 ml  Output 2500 ml  Net -2260 ml   Filed Weights   09/02/23 1637 09/02/23 2043  Weight: 110.8 kg 117.9 kg    Examination:  General exam: Appears calm and comfortable  Respiratory  system: Clear to auscultation. Respiratory effort normal.  2-3 L nasal cannula oxygen Cardiovascular system: S1 & S2 heard, RRR.  Gastrointestinal system: Abdomen is soft Central nervous system: Alert and awake Extremities: Right lower extremity wound, left AKA  Skin: No significant lesions noted Psychiatry: Flat affect.    Data Reviewed: I have personally reviewed following labs and imaging studies  CBC: Recent Labs  Lab 09/02/23 1827  WBC 5.4  HGB 9.6*  HCT 31.8*  MCV 97.2  PLT 155   Basic Metabolic Panel: Recent Labs  Lab 09/02/23 1938 09/03/23 0435  NA 141 141  K 3.3* 3.4*  CL 107 103  CO2 28 32  GLUCOSE 99 94  BUN 19 21  CREATININE 1.21 1.47*  CALCIUM  8.5* 8.6*  MG 2.3 2.2  PHOS  --  4.3   GFR: Estimated Creatinine Clearance: 63.6 mL/min (A) (by C-G formula based on SCr of 1.47 mg/dL (H)). Liver Function Tests: No results for input(s): AST, ALT, ALKPHOS, BILITOT, PROT, ALBUMIN in the last 168 hours. No results for input(s): LIPASE, AMYLASE in the last 168 hours. No results for input(s): AMMONIA in the last 168 hours. Coagulation Profile: No results for input(s): INR, PROTIME in the last 168 hours. Cardiac Enzymes: No results for input(s): CKTOTAL, CKMB, CKMBINDEX, TROPONINI in the last 168 hours. BNP (last 3 results) No results for input(s): PROBNP in the last 8760 hours. HbA1C: No results for input(s): HGBA1C in the last 72 hours. CBG: No results for input(s): GLUCAP in the last 168 hours. Lipid Profile: No results for input(s): CHOL, HDL, LDLCALC, TRIG, CHOLHDL, LDLDIRECT in the last 72 hours. Thyroid Function Tests: No results for input(s): TSH, T4TOTAL, FREET4, T3FREE, THYROIDAB in the last 72 hours. Anemia Panel: Recent Labs    09/03/23 0435  VITAMINB12 407  FERRITIN 41  TIBC 405  IRON 40*   Sepsis Labs: No results for input(s): PROCALCITON, LATICACIDVEN in the last 168  hours.  No results found for this or any previous visit (from the past 240 hours).       Radiology Studies: DG Chest 2 View Result Date: 09/02/2023 CLINICAL DATA:  Heart failure, chest pain EXAM: CHEST - 2 VIEW COMPARISON:  08/10/2023 FINDINGS: Gross cardiomegaly. Status post median sternotomy as well as stent endograft repair of the aortic arch and descending thoracic aorta, unchanged in appearance. Pulmonary vascular prominence and mild diffuse interstitial opacity, similar to prior. No acute osseous findings. IMPRESSION: 1. Gross cardiomegaly with pulmonary vascular prominence and mild diffuse interstitial opacity, similar to prior, consistent with edema. No new or focal airspace opacity. 2. Status post median sternotomy as well as stent endograft repair of the aortic arch and descending thoracic aorta, unchanged in appearance. Electronically Signed   By: Marolyn JONETTA Jaksch M.D.   On: 09/02/2023 17:39        Scheduled Meds:  apixaban   5 mg Oral BID   aspirin  EC  81 mg Oral Daily   atorvastatin   80 mg Oral Daily   diltiazem   240 mg Oral Daily   gabapentin   400 mg Oral TID   hydrALAZINE   50 mg Oral Q8H   isosorbide  mononitrate  30 mg Oral Daily   labetalol   400 mg Oral BID   lisinopril   20 mg Oral Daily   potassium chloride  SA  40 mEq Oral BID   sodium chloride  flush  3 mL Intravenous Q12H   spironolactone   50 mg Oral Daily   torsemide   40 mg Oral Daily   traZODone   100 mg Oral QHS     LOS: 0 days    Time spent: 55 minutes    Kylen Schliep JONETTA Fairly, DO [facility]  If 7PM-7AM, please contact night-coverage [email or web address] 09/03/2023, 10:51 AM

## 2023-09-04 ENCOUNTER — Inpatient Hospital Stay (HOSPITAL_COMMUNITY): Payer: 59

## 2023-09-04 DIAGNOSIS — I5031 Acute diastolic (congestive) heart failure: Secondary | ICD-10-CM | POA: Diagnosis not present

## 2023-09-04 LAB — BASIC METABOLIC PANEL
Anion gap: 9 (ref 5–15)
BUN: 27 mg/dL — ABNORMAL HIGH (ref 8–23)
CO2: 29 mmol/L (ref 22–32)
Calcium: 8.8 mg/dL — ABNORMAL LOW (ref 8.9–10.3)
Chloride: 104 mmol/L (ref 98–111)
Creatinine, Ser: 1.77 mg/dL — ABNORMAL HIGH (ref 0.61–1.24)
GFR, Estimated: 42 mL/min — ABNORMAL LOW (ref >60)
Glucose, Bld: 106 mg/dL — ABNORMAL HIGH (ref 70–99)
Potassium: 4.1 mmol/L (ref 3.5–5.1)
Sodium: 142 mmol/L (ref 135–145)

## 2023-09-04 LAB — TROPONIN I (HIGH SENSITIVITY)
Troponin I (High Sensitivity): 23 ng/L — ABNORMAL HIGH (ref <18)
Troponin I (High Sensitivity): 22 ng/L — ABNORMAL HIGH (ref <18)

## 2023-09-04 LAB — BRAIN NATRIURETIC PEPTIDE: B Natriuretic Peptide: 133 pg/mL — ABNORMAL HIGH (ref 0.0–100.0)

## 2023-09-04 LAB — SEDIMENTATION RATE: Sed Rate: 26 mm/h — ABNORMAL HIGH (ref 0–16)

## 2023-09-04 LAB — C-REACTIVE PROTEIN: CRP: 2 mg/dL — ABNORMAL HIGH (ref <1.0)

## 2023-09-04 LAB — ALBUMIN: Albumin: 3.8 g/dL (ref 3.5–5.0)

## 2023-09-04 LAB — MAGNESIUM: Magnesium: 2.2 mg/dL (ref 1.7–2.4)

## 2023-09-04 LAB — PROCALCITONIN: Procalcitonin: <0.1 ng/mL

## 2023-09-04 MED ORDER — FUROSEMIDE 10 MG/ML IJ SOLN
80.0000 mg | Freq: Once | INTRAMUSCULAR | Status: AC
  Administered 2023-09-04: 80 mg via INTRAVENOUS
  Filled 2023-09-04: qty 8

## 2023-09-04 NOTE — Progress Notes (Signed)
 Patient placed on nasal mask auto cpap with 2L of oxygen.  Patient tolerating well at this time.

## 2023-09-04 NOTE — Progress Notes (Signed)
 PROGRESS NOTE    Connor [name]  FMW:[id number] DOB: 07-07-1956 DOA: 09/02/2023 PCP: Myra Geni ORN, FNP  Chief Complaint  Patient presents with   Chest Pain    Brief Narrative:   Connor [name] is Connor [name] 68 y.o. 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  He states that despite use of Lasix  80 mg twice daily he was not diuresing well at home.   Assessment & Plan:   Principal Problem:   Acute exacerbation of CHF (congestive heart failure) (HCC)  Acute exacerbation heart failure with preserved ejection fraction Reports weight gain of approximately 10 pound to 253 lb since last discharge (dry approximately 244 lb = 110 kg).  He had recent TTE last admission on 12/15 demonstrating LVEF 60 to 65%, severe concentric LVH, grade 2 diastolic dysfunction, biatrial enlargement, elevated RA pressure. Continued edema, particularly to RLE (less impressive to LLE) Appears he needs additional diuresis with persistent weight over baseline (despite bump in creatinine) Received torsemide  today as well as spironolactone  -> will give additional dose of IV lasix  tonight and follow response in AM  Right Lower Extremity Wounds Right Lower Extremity Swelling With his discomfort, will need to have low threshold to consider skin/soft tissue infection/abx, but sed rate not impressive.  Follow CRP and procalcitonin.   Edema is mostly to that RLE, follow LE US     Elevated Troponin Mild elevation, not c/w ACS Monitor   Hypokalemia - Repleted -Recheck in [facility] [place].m.   Chronic medical problems: Resistant hypertension: Continue home diltiazem , hydralazine , spironolactone , isosorbide .  Lisinopril  on hold.  History  of aortic dissection, endovascular repair: Hypertensive management per above, outpatient follow-up surveillance CAD status post CABG, PAD: Continue aspirin , ? No statin on med list, + no fill hx, although OP records have listed as Atorvastatin  80 mg. 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 Obesity class II affecting medical care per above     DVT prophylaxis: eliquis  Code Status: full Family Communication: none Disposition:   Status is: Inpatient Remains inpatient appropriate because: need for continued inpatient care   Consultants:  none  Procedures:  none  Antimicrobials:  Anti-infectives (From admission, onward)    None       Subjective: C/o discomfort to R leg and chest discomfort  Objective: Vitals:   09/03/23 2056 09/04/23 0523 09/04/23 0800 09/04/23 1335  BP: 139/87 (!) 144/87 138/82 112/64  Pulse: 63 69 68 (!) 54  Resp: 20 18  17   Temp: 97.8 F (36.6 C) 98.1 F (36.7 C)  (!) 97.5 F (36.4 C)  TempSrc: Oral Oral  Oral  SpO2: 92% 92% 94% 92%  Weight:  115.7 kg    Height:        Intake/Output Summary (Last 24 hours) at 09/04/2023 1723 Last data filed at 09/04/2023 1300 Gross per 24 hour  Intake 480 ml  Output 900 ml  Net -420 ml   Filed Weights   09/02/23 1637 09/02/23 2043 09/04/23 0523  Weight: 110.8 kg 117.9 kg 115.7 kg  Examination:  General exam: Appears calm and comfortable  Respiratory system: unlabored Cardiovascular system: RRR Gastrointestinal system: Abdomen is nondistended, soft and nontender.  Central nervous system: Alert and oriented. No focal neurological deficits. Extremities: chronic nonhealing wounds to RLE - tense swelling to RLE - no crepitus or  fluctuance, not particularly TTP, decent ROM to R knee    Data Reviewed: I have personally reviewed following labs and imaging studies  CBC: Recent Labs  Lab 09/02/23 1827  WBC 5.4  HGB 9.6*  HCT 31.8*  MCV 97.2  PLT 155    Basic Metabolic Panel: Recent Labs  Lab 09/02/23 1938 09/03/23 0435 09/04/23 0434  NA 141 141 142  K 3.3* 3.4* 4.1  CL 107 103 104  CO2 28 32 29  GLUCOSE 99 94 106*  BUN 19 21 27*  CREATININE 1.21 1.47* 1.77*  CALCIUM  8.5* 8.6* 8.8*  MG 2.3 2.2 2.2  PHOS  --  4.3  --     GFR: Estimated Creatinine Clearance: 52.3 mL/min (Melora Menon) (by C-G formula based on SCr of 1.77 mg/dL (H)).  Liver Function Tests: No results for input(s): AST, ALT, ALKPHOS, BILITOT, PROT, ALBUMIN in the last 168 hours.  CBG: No results for input(s): GLUCAP in the last 168 hours.   No results found for this or any previous visit (from the past 240 hours).       Radiology Studies: No results found.      Scheduled Meds:  apixaban   5 mg Oral BID   aspirin  EC  81 mg Oral Daily   atorvastatin   80 mg Oral Daily   diltiazem   240 mg Oral Daily   gabapentin   400 mg Oral TID   hydrALAZINE   50 mg Oral Q8H   isosorbide  mononitrate  30 mg Oral Daily   labetalol   400 mg Oral BID   lisinopril   20 mg Oral Daily   potassium chloride  SA  40 mEq Oral BID   sodium chloride  flush  3 mL Intravenous Q12H   spironolactone   50 mg Oral Daily   torsemide   40 mg Oral Daily   traZODone   100 mg Oral QHS   Continuous Infusions:   LOS: 1 day    Time spent: over 30 min     Meliton Monte, MD [facility]   To contact the attending provider between 7A-7P or the covering provider during after hours 7P-7A, please log into the web site [email or web address] and access using universal [place] password for that web site. If you do not have the password, please call the hospital operator.  09/04/2023, 5:23 PM

## 2023-09-05 DIAGNOSIS — I5031 Acute diastolic (congestive) heart failure: Secondary | ICD-10-CM | POA: Diagnosis not present

## 2023-09-05 LAB — MAGNESIUM: Magnesium: 2 mg/dL (ref 1.7–2.4)

## 2023-09-05 LAB — CBC WITH DIFFERENTIAL/PLATELET
Abs Immature Granulocytes: 0.02 10*3/uL (ref 0.00–0.07)
Basophils Absolute: 0.1 10*3/uL (ref 0.0–0.1)
Basophils Relative: 1 %
Eosinophils Absolute: 0.2 10*3/uL (ref 0.0–0.5)
Eosinophils Relative: 3 %
HCT: 32.4 % — ABNORMAL LOW (ref 39.0–52.0)
Hemoglobin: 9.7 g/dL — ABNORMAL LOW (ref 13.0–17.0)
Immature Granulocytes: 0 %
Lymphocytes Relative: 20 %
Lymphs Abs: 1.2 10*3/uL (ref 0.7–4.0)
MCH: 29.2 pg (ref 26.0–34.0)
MCHC: 29.9 g/dL — ABNORMAL LOW (ref 30.0–36.0)
MCV: 97.6 fL (ref 80.0–100.0)
Monocytes Absolute: 0.8 10*3/uL (ref 0.1–1.0)
Monocytes Relative: 12 %
Neutro Abs: 4.1 10*3/uL (ref 1.7–7.7)
Neutrophils Relative %: 64 %
Platelets: 151 10*3/uL (ref 150–400)
RBC: 3.32 MIL/uL — ABNORMAL LOW (ref 4.22–5.81)
RDW: 15.6 % — ABNORMAL HIGH (ref 11.5–15.5)
WBC: 6.4 10*3/uL (ref 4.0–10.5)
nRBC: 0 % (ref 0.0–0.2)

## 2023-09-05 LAB — COMPREHENSIVE METABOLIC PANEL
ALT: 12 U/L (ref 0–44)
AST: 17 U/L (ref 15–41)
Albumin: 3.6 g/dL (ref 3.5–5.0)
Alkaline Phosphatase: 77 U/L (ref 38–126)
Anion gap: 8 (ref 5–15)
BUN: 26 mg/dL — ABNORMAL HIGH (ref 8–23)
CO2: 31 mmol/L (ref 22–32)
Calcium: 8.7 mg/dL — ABNORMAL LOW (ref 8.9–10.3)
Chloride: 102 mmol/L (ref 98–111)
Creatinine, Ser: 1.63 mg/dL — ABNORMAL HIGH (ref 0.61–1.24)
GFR, Estimated: 46 mL/min — ABNORMAL LOW (ref >60)
Glucose, Bld: 132 mg/dL — ABNORMAL HIGH (ref 70–99)
Potassium: 4 mmol/L (ref 3.5–5.1)
Sodium: 141 mmol/L (ref 135–145)
Total Bilirubin: 0.6 mg/dL (ref 0.0–1.2)
Total Protein: 7.4 g/dL (ref 6.5–8.1)

## 2023-09-05 LAB — PHOSPHORUS: Phosphorus: 3.8 mg/dL (ref 2.5–4.6)

## 2023-09-05 LAB — BRAIN NATRIURETIC PEPTIDE: B Natriuretic Peptide: 181 pg/mL — ABNORMAL HIGH (ref 0.0–100.0)

## 2023-09-05 MED ORDER — FUROSEMIDE 10 MG/ML IJ SOLN
80.0000 mg | Freq: Two times a day (BID) | INTRAMUSCULAR | Status: DC
  Administered 2023-09-05 – 2023-09-06 (×4): 80 mg via INTRAVENOUS
  Filled 2023-09-05 (×4): qty 8

## 2023-09-05 NOTE — Progress Notes (Signed)
 Patient declined CPAP for tonight. Unit still at bedside if he changes his mind. States he didn't wear it long last night due to stuffy nose.

## 2023-09-05 NOTE — Progress Notes (Signed)
   09/05/23 1222  ReDS Vest / Clip  Station Marker D  Ruler Value 33  ReDS Value Range (!) > 40  ReDS Actual Value 49

## 2023-09-05 NOTE — Progress Notes (Signed)
 PROGRESS NOTE    Connor [name]  FMW:[id number] DOB: [birth year] DOA: 09/02/2023 PCP: Myra Geni ORN, FNP  Chief Complaint  Patient presents with   Chest Pain    Brief Narrative:   Connor [name] is Connor [name] 68 y.o. 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  He states that despite use of Lasix  80 mg twice daily he was not diuresing well at home.   Assessment & Plan:   Principal Problem:   Acute exacerbation of CHF (congestive heart failure) (HCC)  Acute exacerbation heart failure with preserved ejection fraction Reports weight gain of approximately 10 pound to 253 lb since last discharge (dry approximately 244 lb = 110 kg).  He had recent TTE last admission on 12/15 demonstrating LVEF 60 to 65%, severe concentric LVH, grade 2 diastolic dysfunction, biatrial enlargement, elevated RA pressure. Continued edema, particularly to RLE (less impressive to LLE) Continue aggressive diuresis, his LE edema remains impressive Strict I/O, daily weights Lasix  80 mg IV BID, trend creatinine  Right Lower Extremity Wounds Right Lower Extremity Swelling With his discomfort, will need to have low threshold to consider skin/soft tissue infection/abx, but sed rate not impressive.  Follow CRP (only mildly elevated) and procalcitonin (wnl).   Edema is mostly to that RLE, follow LE US  - negative for DVT   Elevated Troponin Mild elevation, not c/w ACS Monitor   Hypokalemia - Repleted -Recheck in [place].m.   Chronic medical problems: Resistant hypertension: Continue home diltiazem , hydralazine , spironolactone , isosorbide .  Lisinopril  on hold.  History of aortic dissection, endovascular repair: Hypertensive  management per above, outpatient follow-up surveillance CAD status post CABG, PAD: Continue aspirin , ? No statin on med list, + no fill hx, although OP records have listed as Atorvastatin  80 mg. Resume on Atorvastatin   History left AKA from necrotizing fasciitis  CKD 2-3: Baseline creatinine approximately 1.2 Persistent [name]-fib: Continue home Eliquis , diltiazem  History anemia chronic disease: Check iron panel (low iron, normal ferritin), B12 (normal) OSA: CPAP at night   Body mass index is 37.31 kg/m. Obesity class II affecting medical care per above     DVT prophylaxis: eliquis  Code Status: full Family Communication: none Disposition:   Status is: Inpatient Remains inpatient appropriate because: need for continued inpatient care   Consultants:  none  Procedures:  none  Antimicrobials:  Anti-infectives (From admission, onward)    None       Subjective: Feels [name] little better than yesterday  Objective: Vitals:   09/05/23 0352 09/05/23 0500 09/05/23 0637 09/05/23 1328  BP: 123/74  133/74 (!) 111/58  Pulse: 66   (!) 57  Resp: 20   18  Temp: 97.7 F (36.5 C)     TempSrc: Oral     SpO2: 96%   99%  Weight:  113.2 kg    Height:        Intake/Output Summary (Last 24 hours) at 09/05/2023 1437 Last data filed at 09/05/2023 1400 Gross per 24 hour  Intake 720 ml  Output 1950 ml  Net -1230 ml   Filed Weights   09/02/23 2043 09/04/23 0523 09/05/23 0500  Weight: 117.9 kg 115.7 kg 113.2 kg    Examination:  General: No acute distress. Cardiovascular: RRR Lungs: Clear to auscultation bilaterally  Neurological: Alert and oriented  3. Moves all extremities 4. Cranial nerves II through XII grossly intact. Extremities: RLE swollen, tight - mild improvement - dressing to RLE, L AKA   Data Reviewed: I have personally reviewed following labs and imaging studies  CBC: Recent Labs  Lab 09/02/23 1827 09/05/23 0443  WBC 5.4 6.4  NEUTROABS  --  4.1  HGB 9.6* 9.7*  HCT  31.8* 32.4*  MCV 97.2 97.6  PLT 155 151    Basic Metabolic Panel: Recent Labs  Lab 09/02/23 1938 09/03/23 0435 09/04/23 0434 09/05/23 0443  NA 141 141 142 141  K 3.3* 3.4* 4.1 4.0  CL 107 103 104 102  CO2 28 32 29 31  GLUCOSE 99 94 106* 132*  BUN 19 21 27* 26*  CREATININE 1.21 1.47* 1.77* 1.63*  CALCIUM  8.5* 8.6* 8.8* 8.7*  MG 2.3 2.2 2.2 2.0  PHOS  --  4.3  --  3.8    GFR: Estimated Creatinine Clearance: 56.2 mL/min (Rommie Dunn) (by C-G formula based on SCr of 1.63 mg/dL (H)).  Liver Function Tests: Recent Labs  Lab 09/04/23 1638 09/05/23 0443  AST  --  17  ALT  --  12  ALKPHOS  --  77  BILITOT  --  0.6  PROT  --  7.4  ALBUMIN 3.8 3.6    CBG: No results for input(s): GLUCAP in the last 168 hours.   No results found for this or any previous visit (from the past 240 hours).       Radiology Studies: DG CHEST PORT 1 VIEW Result Date: 09/04/2023 CLINICAL DATA:  Fluid overload EXAM: PORTABLE CHEST 1 VIEW COMPARISON:  09/02/2023 FINDINGS: Cardiac shadow is enlarged. Aortic stent graft is noted in the descending thoracic aorta. Postsurgical changes are seen. Mild vascular congestion is noted stable from the prior exam. No significant edema is noted. No focal infiltrate or effusion is noted. No bony abnormality is seen. IMPRESSION: Vascular congestion without focal infiltrate. Electronically Signed   By: [name] M.D.   On: 09/04/2023 17:59   US  Venous Img Lower Unilateral Right (DVT) Result Date: 09/04/2023 CLINICAL DATA:  Right lower extremity edema EXAM: RIGHT LOWER EXTREMITY VENOUS DOPPLER ULTRASOUND TECHNIQUE: Gray-scale sonography with graded compression, as well as color Doppler and duplex ultrasound were performed to evaluate the lower extremity deep venous systems from the level of the common femoral vein and including the common femoral, femoral, profunda femoral, popliteal and calf veins including the posterior tibial, peroneal and gastrocnemius veins when  visible. The superficial great saphenous vein was also interrogated. Spectral Doppler was utilized to evaluate flow at rest and with distal augmentation maneuvers in the common femoral, femoral and popliteal veins. COMPARISON:  None Available. FINDINGS: Contralateral Common Femoral Vein: Respiratory phasicity is normal and symmetric with the symptomatic side. No evidence of thrombus. Normal compressibility. Common Femoral Vein: No evidence of thrombus. Normal compressibility, respiratory phasicity and response to augmentation. Saphenofemoral Junction: No evidence of thrombus. Normal compressibility and flow on color Doppler imaging. Profunda Femoral Vein: No evidence of thrombus. Normal compressibility and flow on color Doppler imaging. Femoral Vein: No evidence of thrombus. Normal compressibility, respiratory phasicity and response to augmentation. Popliteal Vein: No evidence of thrombus. Normal compressibility, respiratory phasicity and response to augmentation. Calf Veins: No evidence of thrombus. Normal compressibility and flow on color Doppler imaging. Peroneal veins are not well visualized. Superficial Great Saphenous Vein: No evidence of thrombus. Normal compressibility. Venous Reflux:  None. Other Findings:  None. IMPRESSION: No evidence of deep venous  thrombosis. Electronically Signed   By: [name] M.D.   On: 09/04/2023 17:58        Scheduled Meds:  apixaban   5 mg Oral BID   aspirin  EC  81 mg Oral Daily   atorvastatin   80 mg Oral Daily   diltiazem   240 mg Oral Daily   furosemide   80 mg Intravenous BID   gabapentin   400 mg Oral TID   hydrALAZINE   50 mg Oral Q8H   isosorbide  mononitrate  30 mg Oral Daily   labetalol   400 mg Oral BID   potassium chloride  SA  40 mEq Oral BID   sodium chloride  flush  3 mL Intravenous Q12H   spironolactone   50 mg Oral Daily   traZODone   100 mg Oral QHS   Continuous Infusions:   LOS: 2 days    Time spent: over 30 min     Meliton Monte,  MD [facility]   To contact the attending provider between 7A-7P or the covering provider during after hours 7P-7A, please log into the web site [email or web address] and access using universal [place] password for that web site. If you do not have the password, please call the hospital operator.  09/05/2023, 2:37 PM

## 2023-09-05 NOTE — Plan of Care (Signed)
 Oxygen sats WNL while on room air. Wore Cpap up until 0140 this a.m. No c/o chest pain .  Problem: Education: Goal: Knowledge of General Education information will improve Description: Including pain rating scale, medication(s)/side effects and non-pharmacologic comfort measures Outcome: Progressing   Problem: Health Behavior/Discharge Planning: Goal: Ability to manage health-related needs will improve Outcome: Progressing   Problem: Clinical Measurements: Goal: Ability to maintain clinical measurements within normal limits will improve Outcome: Progressing Goal: Will remain free from infection Outcome: Progressing Goal: Diagnostic test results will improve Outcome: Progressing Goal: Respiratory complications will improve Outcome: Progressing Goal: Cardiovascular complication will be avoided Outcome: Progressing   Problem: Activity: Goal: Risk for activity intolerance will decrease Outcome: Progressing   Problem: Nutrition: Goal: Adequate nutrition will be maintained Outcome: Progressing   Problem: Coping: Goal: Level of anxiety will decrease Outcome: Progressing   Problem: Elimination: Goal: Will not experience complications related to bowel motility Outcome: Progressing Goal: Will not experience complications related to urinary retention Outcome: Progressing   Problem: Pain Management: Goal: General experience of comfort will improve Outcome: Progressing   Problem: Safety: Goal: Ability to remain free from injury will improve Outcome: Progressing   Problem: Skin Integrity: Goal: Risk for impaired skin integrity will decrease Outcome: Progressing   Problem: Education: Goal: Ability to demonstrate management of disease process will improve Outcome: Progressing Goal: Ability to verbalize understanding of medication therapies will improve Outcome: Progressing Goal: Individualized Educational Video(s) Outcome: Progressing   Problem: Activity: Goal: Capacity  to carry out activities will improve Outcome: Progressing   Problem: Cardiac: Goal: Ability to achieve and maintain adequate cardiopulmonary perfusion will improve Outcome: Progressing

## 2023-09-06 DIAGNOSIS — I5031 Acute diastolic (congestive) heart failure: Secondary | ICD-10-CM | POA: Diagnosis not present

## 2023-09-06 LAB — CBC WITH DIFFERENTIAL/PLATELET
Abs Immature Granulocytes: 0.02 10*3/uL (ref 0.00–0.07)
Basophils Absolute: 0 10*3/uL (ref 0.0–0.1)
Basophils Relative: 1 %
Eosinophils Absolute: 0.3 10*3/uL (ref 0.0–0.5)
Eosinophils Relative: 5 %
HCT: 33 % — ABNORMAL LOW (ref 39.0–52.0)
Hemoglobin: 10 g/dL — ABNORMAL LOW (ref 13.0–17.0)
Immature Granulocytes: 0 %
Lymphocytes Relative: 20 %
Lymphs Abs: 1.1 10*3/uL (ref 0.7–4.0)
MCH: 29.5 pg (ref 26.0–34.0)
MCHC: 30.3 g/dL (ref 30.0–36.0)
MCV: 97.3 fL (ref 80.0–100.0)
Monocytes Absolute: 0.7 10*3/uL (ref 0.1–1.0)
Monocytes Relative: 13 %
Neutro Abs: 3.4 10*3/uL (ref 1.7–7.7)
Neutrophils Relative %: 61 %
Platelets: 164 10*3/uL (ref 150–400)
RBC: 3.39 MIL/uL — ABNORMAL LOW (ref 4.22–5.81)
RDW: 15.5 % (ref 11.5–15.5)
WBC: 5.6 10*3/uL (ref 4.0–10.5)
nRBC: 0 % (ref 0.0–0.2)

## 2023-09-06 LAB — COMPREHENSIVE METABOLIC PANEL
ALT: 11 U/L (ref 0–44)
AST: 16 U/L (ref 15–41)
Albumin: 3.7 g/dL (ref 3.5–5.0)
Alkaline Phosphatase: 70 U/L (ref 38–126)
Anion gap: 6 (ref 5–15)
BUN: 28 mg/dL — ABNORMAL HIGH (ref 8–23)
CO2: 32 mmol/L (ref 22–32)
Calcium: 8.7 mg/dL — ABNORMAL LOW (ref 8.9–10.3)
Chloride: 100 mmol/L (ref 98–111)
Creatinine, Ser: 1.36 mg/dL — ABNORMAL HIGH (ref 0.61–1.24)
GFR, Estimated: 57 mL/min — ABNORMAL LOW (ref >60)
Glucose, Bld: 91 mg/dL (ref 70–99)
Potassium: 4 mmol/L (ref 3.5–5.1)
Sodium: 138 mmol/L (ref 135–145)
Total Bilirubin: 0.8 mg/dL (ref 0.0–1.2)
Total Protein: 7.5 g/dL (ref 6.5–8.1)

## 2023-09-06 LAB — PHOSPHORUS: Phosphorus: 3.5 mg/dL (ref 2.5–4.6)

## 2023-09-06 LAB — BRAIN NATRIURETIC PEPTIDE: B Natriuretic Peptide: 88 pg/mL (ref 0.0–100.0)

## 2023-09-06 NOTE — Progress Notes (Signed)
 PROGRESS NOTE    Connor [name]  FMW:[id number] DOB: 14-Mar-1956 DOA: 09/02/2023 PCP: Myra Geni ORN, FNP  Chief Complaint  Patient presents with   Chest Pain    Brief Narrative:   Connor [name] is Connor [name] 68 y.o. 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  He states that despite use of Lasix  80 mg twice daily he was not diuresing well at home.   Assessment & Plan:   Principal Problem:   Acute exacerbation of CHF (congestive heart failure) (HCC)  Acute exacerbation heart failure with preserved ejection fraction Reports weight gain of approximately 10 pound to 253 lb since last discharge (dry approximately 244 lb = 110 kg).  He had recent TTE last admission on 12/15 demonstrating LVEF 60 to 65%, severe concentric LVH, grade 2 diastolic dysfunction, biatrial enlargement, elevated RA pressure. Continued edema, particularly to RLE (less impressive to LLE) Continue aggressive diuresis, his LE edema remains impressive Strict I/O, daily weights Lasix  80 mg IV BID, trend creatinine  Right Lower Extremity Wounds Right Lower Extremity Swelling With his discomfort, will need to have low threshold to consider skin/soft tissue infection/abx, but sed rate not impressive.  Follow CRP (only mildly elevated) and procalcitonin (wnl).   Edema is mostly to that RLE, follow LE US  - negative for DVT   Elevated Troponin Mild elevation, not c/w ACS Monitor   Hypokalemia Replace and follow    Chronic medical problems: Resistant hypertension: Continue home diltiazem , hydralazine , spironolactone , isosorbide .  Lisinopril  on hold.  History of aortic dissection, endovascular repair: Hypertensive management  per above, outpatient follow-up surveillance CAD status post CABG, PAD: Continue aspirin , ? No statin on med list, + no fill hx, although OP records have listed as Atorvastatin  80 mg. 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 Obesity class II affecting medical care per above     DVT prophylaxis: eliquis  Code Status: full Family Communication: none Disposition:   Status is: Inpatient Remains inpatient appropriate because: need for continued inpatient care   Consultants:  none  Procedures:  none  Antimicrobials:  Anti-infectives (From admission, onward)    None       Subjective: Feels Connor [name] little better than yesterday  Objective: Vitals:   09/06/23 0455 09/06/23 0500 09/06/23 0842 09/06/23 1335  BP: 118/60  (!) 165/82 137/71  Pulse: 68  68 (!) 55  Resp: 20     Temp: 98.6 F (37 C)     TempSrc: Oral     SpO2: 93%   93%  Weight:  113.8 kg    Height:        Intake/Output Summary (Last 24 hours) at 09/06/2023 1421 Last data filed at 09/06/2023 1237 Gross per 24 hour  Intake --  Output 2400 ml  Net -2400 ml   Filed Weights   09/05/23 0500 09/06/23 0444 09/06/23 0500  Weight: 113.2 kg 114 kg 113.8 kg    Examination:  General: No acute distress. Cardiovascular: RRR Lungs: Clear to auscultation bilaterally  Neurological: Alert and oriented 3. Moves  all extremities 4. Cranial nerves II through XII grossly intact. Extremities: RLE swollen, tight - mild improvement - dressing to RLE, L AKA   Data Reviewed: I have personally reviewed following labs and imaging studies  CBC: Recent Labs  Lab 09/02/23 1827 09/05/23 0443 09/06/23 0418  WBC 5.4 6.4 5.6  NEUTROABS  --  4.1 3.4  HGB 9.6* 9.7*  10.0*  HCT 31.8* 32.4* 33.0*  MCV 97.2 97.6 97.3  PLT 155 151 164    Basic Metabolic Panel: Recent Labs  Lab 09/02/23 1938 09/03/23 0435 09/04/23 0434 09/05/23 0443 09/06/23 0418  NA 141 141 142 141 138  K 3.3* 3.4* 4.1 4.0 4.0  CL 107 103 104 102 100  CO2 28 32 29 31 32  GLUCOSE 99 94 106* 132* 91  BUN 19 21 27* 26* 28*  CREATININE 1.21 1.47* 1.77* 1.63* 1.36*  CALCIUM  8.5* 8.6* 8.8* 8.7* 8.7*  MG 2.3 2.2 2.2 2.0  --   PHOS  --  4.3  --  3.8 3.5    GFR: Estimated Creatinine Clearance: 67.5 mL/min (Connor [name]) (by C-G formula based on SCr of 1.36 mg/dL (H)).  Liver Function Tests: Recent Labs  Lab 09/04/23 1638 09/05/23 0443 09/06/23 0418  AST  --  17 16  ALT  --  12 11  ALKPHOS  --  77 70  BILITOT  --  0.6 0.8  PROT  --  7.4 7.5  ALBUMIN 3.8 3.6 3.7    CBG: No results for input(s): GLUCAP in the last 168 hours.   No results found for this or any previous visit (from the past 240 hours).       Radiology Studies: DG CHEST PORT 1 VIEW Result Date: 09/04/2023 CLINICAL DATA:  Fluid overload EXAM: PORTABLE CHEST 1 VIEW COMPARISON:  09/02/2023 FINDINGS: Cardiac shadow is enlarged. Aortic stent graft is noted in the descending thoracic aorta. Postsurgical changes are seen. Mild vascular congestion is noted stable from the prior exam. No significant edema is noted. No focal infiltrate or effusion is noted. No bony abnormality is seen. IMPRESSION: Vascular congestion without focal infiltrate. Electronically Signed   By: Connor [name] M.D.   On: 09/04/2023 17:59   US  Venous Img Lower Unilateral Right (DVT) Result Date: 09/04/2023 CLINICAL DATA:  Right lower extremity edema EXAM: RIGHT LOWER EXTREMITY VENOUS DOPPLER ULTRASOUND TECHNIQUE: Gray-scale sonography with graded compression, as well as color Doppler and duplex ultrasound were performed to evaluate the lower extremity deep venous systems from the level of the common femoral vein and including the common femoral, femoral,  profunda femoral, popliteal and calf veins including the posterior tibial, peroneal and gastrocnemius veins when visible. The superficial great saphenous vein was also interrogated. Spectral Doppler was utilized to evaluate flow at rest and with distal augmentation maneuvers in the common femoral, femoral and popliteal veins. COMPARISON:  None Available. FINDINGS: Contralateral Common Femoral Vein: Respiratory phasicity is normal and symmetric with the symptomatic side. No evidence of thrombus. Normal compressibility. Common Femoral Vein: No evidence of thrombus. Normal compressibility, respiratory phasicity and response to augmentation. Saphenofemoral Junction: No evidence of thrombus. Normal compressibility and flow on color Doppler imaging. Profunda Femoral Vein: No evidence of thrombus. Normal compressibility and flow on color Doppler imaging. Femoral Vein: No evidence of thrombus. Normal compressibility, respiratory phasicity and response to augmentation. Popliteal Vein: No evidence of thrombus. Normal compressibility, respiratory phasicity and response to augmentation. Calf Veins: No evidence of thrombus. Normal compressibility and flow on color Doppler imaging. Peroneal veins  are not well visualized. Superficial Great Saphenous Vein: No evidence of thrombus. Normal compressibility. Venous Reflux:  None. Other Findings:  None. IMPRESSION: No evidence of deep venous thrombosis. Electronically Signed   By: Connor [name] M.D.   On: 09/04/2023 17:58        Scheduled Meds:  apixaban   5 mg Oral BID   aspirin  EC  81 mg Oral Daily   atorvastatin   80 mg Oral Daily   diltiazem   240 mg Oral Daily   furosemide   80 mg Intravenous BID   gabapentin   400 mg Oral TID   hydrALAZINE   50 mg Oral Q8H   isosorbide  mononitrate  30 mg Oral Daily   labetalol   400 mg Oral BID   potassium chloride  SA  40 mEq Oral BID   sodium chloride  flush  3 mL Intravenous Q12H   spironolactone   50 mg Oral Daily   traZODone   100 mg  Oral QHS   Continuous Infusions:   LOS: 3 days    Time spent: over 30 min     Meliton Monte, MD [facility]   To contact the attending provider between 7A-7P or the covering provider during after hours 7P-7A, please log into the web site [email or web address] and access using universal [place] password for that web site. If you do not have the password, please call the hospital operator.  09/06/2023, 2:21 PM

## 2023-09-06 NOTE — Plan of Care (Signed)

## 2023-09-06 NOTE — TOC Progression Note (Signed)
 Transition of Care [facility]) - Progression Note    Patient Details  Name: Connor [name] MRN: [id number] Date of Birth: 09/01/1955  Transition of Care [facility]) CM/SW Contact  Lucie Lunger, [place] Phone Number: 09/06/2023, 10:17 AM  Clinical Narrative:    Pt is now listed as high risk for readmission. Pt assessed earlier in admission. Pt lives with family. Pt has transportation to appointments. Pt has had HH in the past. Pt has a walker and a wheelchair to use in the home. Pt states that he takes medications daily and tries to follow a heart healthy diet. Pt states that he weighs every other day. TOC to follow.   Expected Discharge Plan: Home/Self Care Barriers to Discharge: Continued Medical Work up  Expected Discharge Plan and Services In-house Referral: Clinical Social Work     Living arrangements for the past 2 months: Single Family Home                                       Social Determinants of Health (SDOH) Interventions SDOH Screenings   Food Insecurity: No Food Insecurity (09/03/2023)  Housing: Low Risk  (09/03/2023)  Transportation Needs: No Transportation Needs (09/03/2023)  Utilities: Not At Risk (09/03/2023)  Recent Concern: Utilities - At Risk (08/10/2023)  Financial Resource Strain: Low Risk  (02/06/2021)   Received from [facility]  Social Connections: Moderately Integrated (09/03/2023)  Tobacco Use: Medium Risk (09/02/2023)    Readmission Risk Interventions    09/06/2023   10:17 AM  Readmission Risk Prevention Plan  Transportation Screening Complete  HRI or Home Care Consult Complete  Social Work Consult for Recovery Care Planning/Counseling Complete  Palliative Care Screening Not Applicable  Medication Review [employer]) Complete

## 2023-09-06 NOTE — Care Management Important Message (Signed)
 Important Message  Patient Details  Name: Connor [name] MRN: [id number] Date of Birth: [birth year]   Important Message Given:  Yes - Medicare IM     Corey Harold 09/06/2023, 11:25 AM

## 2023-09-07 DIAGNOSIS — I5031 Acute diastolic (congestive) heart failure: Secondary | ICD-10-CM | POA: Diagnosis not present

## 2023-09-07 LAB — COMPREHENSIVE METABOLIC PANEL
ALT: 13 U/L (ref 0–44)
AST: 17 U/L (ref 15–41)
Albumin: 3.6 g/dL (ref 3.5–5.0)
Alkaline Phosphatase: 78 U/L (ref 38–126)
Anion gap: 8 (ref 5–15)
BUN: 29 mg/dL — ABNORMAL HIGH (ref 8–23)
CO2: 30 mmol/L (ref 22–32)
Calcium: 8.9 mg/dL (ref 8.9–10.3)
Chloride: 102 mmol/L (ref 98–111)
Creatinine, Ser: 1.32 mg/dL — ABNORMAL HIGH (ref 0.61–1.24)
GFR, Estimated: 59 mL/min — ABNORMAL LOW (ref >60)
Glucose, Bld: 105 mg/dL — ABNORMAL HIGH (ref 70–99)
Potassium: 4.1 mmol/L (ref 3.5–5.1)
Sodium: 140 mmol/L (ref 135–145)
Total Bilirubin: 0.7 mg/dL (ref 0.0–1.2)
Total Protein: 7.4 g/dL (ref 6.5–8.1)

## 2023-09-07 LAB — CBC WITH DIFFERENTIAL/PLATELET
Abs Immature Granulocytes: 0.02 10*3/uL (ref 0.00–0.07)
Basophils Absolute: 0 10*3/uL (ref 0.0–0.1)
Basophils Relative: 1 %
Eosinophils Absolute: 0.4 10*3/uL (ref 0.0–0.5)
Eosinophils Relative: 7 %
HCT: 35.7 % — ABNORMAL LOW (ref 39.0–52.0)
Hemoglobin: 10.4 g/dL — ABNORMAL LOW (ref 13.0–17.0)
Immature Granulocytes: 0 %
Lymphocytes Relative: 25 %
Lymphs Abs: 1.5 10*3/uL (ref 0.7–4.0)
MCH: 28.3 pg (ref 26.0–34.0)
MCHC: 29.1 g/dL — ABNORMAL LOW (ref 30.0–36.0)
MCV: 97 fL (ref 80.0–100.0)
Monocytes Absolute: 0.9 10*3/uL (ref 0.1–1.0)
Monocytes Relative: 15 %
Neutro Abs: 3.2 10*3/uL (ref 1.7–7.7)
Neutrophils Relative %: 52 %
Platelets: 172 10*3/uL (ref 150–400)
RBC: 3.68 MIL/uL — ABNORMAL LOW (ref 4.22–5.81)
RDW: 15.3 % (ref 11.5–15.5)
WBC: 6.1 10*3/uL (ref 4.0–10.5)
nRBC: 0 % (ref 0.0–0.2)

## 2023-09-07 LAB — MAGNESIUM: Magnesium: 2.3 mg/dL (ref 1.7–2.4)

## 2023-09-07 LAB — BRAIN NATRIURETIC PEPTIDE: B Natriuretic Peptide: 97 pg/mL (ref 0.0–100.0)

## 2023-09-07 LAB — PHOSPHORUS: Phosphorus: 4.5 mg/dL (ref 2.5–4.6)

## 2023-09-07 MED ORDER — DEXTROSE 5 % IV SOLN
100.0000 mg | Freq: Two times a day (BID) | INTRAVENOUS | Status: DC
  Administered 2023-09-07: 100 mg via INTRAVENOUS
  Filled 2023-09-07 (×3): qty 10

## 2023-09-07 MED ORDER — PNEUMOCOCCAL 20-VAL CONJ VACC 0.5 ML IM SUSY: 0.5000 mL | PREFILLED_SYRINGE | INTRAMUSCULAR | Status: DC

## 2023-09-07 NOTE — Plan of Care (Signed)

## 2023-09-07 NOTE — Plan of Care (Signed)
   Problem: Education: Goal: Knowledge of General Education information will improve Description Including pain rating scale, medication(s)/side effects and non-pharmacologic comfort measures Outcome: Progressing

## 2023-09-07 NOTE — Progress Notes (Signed)
 PROGRESS NOTE    Connor [name]  FMW:[id number] DOB: 1956/03/15 DOA: 09/02/2023 PCP: Myra Geni ORN, FNP  Chief Complaint  Patient presents with   Chest Pain    Brief Narrative:   Connor [name] is Connor [name] 68 y.o. 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  He states that despite use of Lasix  80 mg twice daily he was not diuresing well at home.   Assessment & Plan:   Principal Problem:   Acute exacerbation of CHF (congestive heart failure) (HCC)  Acute exacerbation heart failure with preserved ejection fraction Reports weight gain of approximately 10 pound to 253 lb since last discharge (dry approximately 244 lb = 110 kg).  He had recent TTE last admission on 12/15 demonstrating LVEF 60 to 65%, severe concentric LVH, grade 2 diastolic dysfunction, biatrial enlargement, elevated RA pressure. Continued edema, particularly to RLE (less impressive to LLE) Continue aggressive diuresis, his LE edema remains tense, slowly improving Strict I/O, daily weights (unclear if weights accurate, net negative 8.9 kg) Lasix  100 mg IV BID (slowly improving, will see if he might respond more to higher dose), trend creatinine  Right Lower Extremity Wounds Right Lower Extremity Swelling With his discomfort, will need to have low threshold to consider skin/soft tissue infection/abx, but sed rate not impressive.  Follow CRP (only mildly elevated) and procalcitonin (wnl).   Edema is mostly to that RLE, follow LE US  - negative for DVT Seems to be gradually improving with diuresis above   Elevated Troponin Mild elevation, not c/w ACS Monitor   Hypokalemia Replace and follow    Chronic medical  problems: Resistant hypertension: Continue home diltiazem , hydralazine , spironolactone , isosorbide .  Lisinopril  on hold.  History of aortic dissection, endovascular repair: Hypertensive management per above, outpatient follow-up surveillance CAD status post CABG, PAD: Continue aspirin , ? No statin on med list, + no fill hx, although OP records have listed as Atorvastatin  80 mg. 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 Obesity class II affecting medical care per above     DVT prophylaxis: eliquis  Code Status: full Family Communication: none Disposition:   Status is: Inpatient Remains inpatient appropriate because: need for continued inpatient care   Consultants:  none  Procedures:  none  Antimicrobials:  Anti-infectives (From admission, onward)    None       Subjective: Leg feels Connor [name] little better today  Objective: Vitals:   09/06/23 1335 09/06/23 1951 09/07/23 0038 09/07/23 0611  BP: 137/71 127/63  (!) 142/77  Pulse: (!) 55 71 66   Resp:  20 16   Temp:  98 F (36.7 C)    TempSrc:  Oral    SpO2: 93% 95% 96% 100%  Weight:    113 kg  Height:        Intake/Output Summary (Last 24 hours) at 09/07/2023 0808 Last data filed at 09/06/2023 1832 Gross per 24 hour  Intake --  Output 2400 ml  Net -2400 ml   Filed Weights   09/06/23 0444 09/06/23 0500 09/07/23 9388  Weight: 114 kg 113.8 kg 113 kg    Examination:  General: No acute distress. Cardiovascular: RRR Lungs: unlabored Neurological: Alert and oriented 3. Moves all extremities 4. Cranial nerves II through XII grossly intact. Extremities: continued significant edema to RLE.  L AKA.    Data Reviewed: I have personally reviewed following  labs and imaging studies  CBC: Recent Labs  Lab 09/02/23 1827 09/05/23 0443 09/06/23 0418 09/07/23 0409  WBC 5.4 6.4 5.6 6.1  NEUTROABS  --  4.1 3.4 3.2  HGB 9.6* 9.7* 10.0* 10.4*  HCT 31.8* 32.4* 33.0* 35.7*  MCV 97.2 97.6 97.3 97.0  PLT 155 151 164 172    Basic Metabolic Panel: Recent Labs  Lab 09/02/23 1938 09/03/23 0435 09/04/23 0434 09/05/23 0443 09/06/23 0418 09/07/23 0409  NA 141 141 142 141 138 140  K 3.3* 3.4* 4.1 4.0 4.0 4.1  CL 107 103 104 102 100 102  CO2 28 32 29 31 32 30  GLUCOSE 99 94 106* 132* 91 105*  BUN 19 21 27* 26* 28* 29*  CREATININE 1.21 1.47* 1.77* 1.63* 1.36* 1.32*  CALCIUM  8.5* 8.6* 8.8* 8.7* 8.7* 8.9  MG 2.3 2.2 2.2 2.0  --  2.3  PHOS  --  4.3  --  3.8 3.5 4.5    GFR: Estimated Creatinine Clearance: 69.3 mL/min (Connor [name]) (by C-G formula based on SCr of 1.32 mg/dL (H)).  Liver Function Tests: Recent Labs  Lab 09/04/23 1638 09/05/23 0443 09/06/23 0418 09/07/23 0409  AST  --  17 16 17   ALT  --  12 11 13   ALKPHOS  --  77 70 78  BILITOT  --  0.6 0.8 0.7  PROT  --  7.4 7.5 7.4  ALBUMIN 3.8 3.6 3.7 3.6    CBG: No results for input(s): GLUCAP in the last 168 hours.   No results found for this or any previous visit (from the past 240 hours).       Radiology Studies: No results found.       Scheduled Meds:  apixaban   5 mg Oral BID   aspirin  EC  81 mg Oral Daily   atorvastatin   80 mg Oral Daily   diltiazem   240 mg Oral Daily   furosemide   80 mg Intravenous BID   gabapentin   400 mg Oral TID   hydrALAZINE   50 mg Oral Q8H   isosorbide  mononitrate  30 mg Oral Daily   labetalol   400 mg Oral BID   potassium chloride  SA  40 mEq Oral BID   sodium chloride  flush  3 mL Intravenous Q12H   spironolactone   50 mg Oral Daily   traZODone   100 mg Oral QHS   Continuous Infusions:   LOS: 4 days    Time spent: over 30 min     Connor Monte, MD [facility]   To contact the attending provider between 7A-7P or the  covering provider during after hours 7P-7A, please log into the web site [email or web address] and access using universal [place] password for that web site. If you do not have the password, please call the hospital operator.  09/07/2023, 8:08 AM

## 2023-09-08 ENCOUNTER — Inpatient Hospital Stay (HOSPITAL_COMMUNITY): Payer: 59

## 2023-09-08 DIAGNOSIS — I5031 Acute diastolic (congestive) heart failure: Secondary | ICD-10-CM | POA: Diagnosis not present

## 2023-09-08 LAB — CBC WITH DIFFERENTIAL/PLATELET
Abs Immature Granulocytes: 0.02 10*3/uL (ref 0.00–0.07)
Basophils Absolute: 0.1 10*3/uL (ref 0.0–0.1)
Basophils Relative: 1 %
Eosinophils Absolute: 0.5 10*3/uL (ref 0.0–0.5)
Eosinophils Relative: 9 %
HCT: 33.8 % — ABNORMAL LOW (ref 39.0–52.0)
Hemoglobin: 10.1 g/dL — ABNORMAL LOW (ref 13.0–17.0)
Immature Granulocytes: 0 %
Lymphocytes Relative: 26 %
Lymphs Abs: 1.4 10*3/uL (ref 0.7–4.0)
MCH: 28.7 pg (ref 26.0–34.0)
MCHC: 29.9 g/dL — ABNORMAL LOW (ref 30.0–36.0)
MCV: 96 fL (ref 80.0–100.0)
Monocytes Absolute: 0.8 10*3/uL (ref 0.1–1.0)
Monocytes Relative: 14 %
Neutro Abs: 2.7 10*3/uL (ref 1.7–7.7)
Neutrophils Relative %: 50 %
Platelets: 180 10*3/uL (ref 150–400)
RBC: 3.52 MIL/uL — ABNORMAL LOW (ref 4.22–5.81)
RDW: 15.2 % (ref 11.5–15.5)
WBC: 5.4 10*3/uL (ref 4.0–10.5)
nRBC: 0 % (ref 0.0–0.2)

## 2023-09-08 LAB — COMPREHENSIVE METABOLIC PANEL
ALT: 12 U/L (ref 0–44)
AST: 17 U/L (ref 15–41)
Albumin: 3.6 g/dL (ref 3.5–5.0)
Alkaline Phosphatase: 72 U/L (ref 38–126)
Anion gap: 7 (ref 5–15)
BUN: 33 mg/dL — ABNORMAL HIGH (ref 8–23)
CO2: 30 mmol/L (ref 22–32)
Calcium: 8.7 mg/dL — ABNORMAL LOW (ref 8.9–10.3)
Chloride: 100 mmol/L (ref 98–111)
Creatinine, Ser: 1.56 mg/dL — ABNORMAL HIGH (ref 0.61–1.24)
GFR, Estimated: 49 mL/min — ABNORMAL LOW (ref >60)
Glucose, Bld: 98 mg/dL (ref 70–99)
Potassium: 4.8 mmol/L (ref 3.5–5.1)
Sodium: 137 mmol/L (ref 135–145)
Total Bilirubin: 0.7 mg/dL (ref 0.0–1.2)
Total Protein: 7.2 g/dL (ref 6.5–8.1)

## 2023-09-08 LAB — BRAIN NATRIURETIC PEPTIDE: B Natriuretic Peptide: 53 pg/mL (ref 0.0–100.0)

## 2023-09-08 LAB — MAGNESIUM: Magnesium: 2.5 mg/dL — ABNORMAL HIGH (ref 1.7–2.4)

## 2023-09-08 LAB — PHOSPHORUS: Phosphorus: 4.5 mg/dL (ref 2.5–4.6)

## 2023-09-08 MED ORDER — FUROSEMIDE 10 MG/ML IJ SOLN
100.0000 mg | Freq: Once | INTRAVENOUS | Status: AC
  Administered 2023-09-08: 100 mg via INTRAVENOUS
  Filled 2023-09-08: qty 10

## 2023-09-08 NOTE — Progress Notes (Signed)
 Screen:  PT screened states that he is getting up and going to the bathroom by himself.  PT does not use his prothesis at home.  Mainly uses W/C then uses walker for tighter spaces like the bathroom.  PT is completing this mobility and does not need skilled PT.  PT does complain of RT UE weakness may benefit from a [facility] or OP OT referral.  Montie Metro, PT CLT [phone]

## 2023-09-08 NOTE — Progress Notes (Addendum)
 PROGRESS NOTE    Connor [name]  FMW:[id number] DOB: [birth year] DOA: 09/02/2023 PCP: Myra Geni ORN, FNP  Chief Complaint  Patient presents with   Chest Pain    Brief Narrative:   Connor [name] is Connor [name] 68 y.o. 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  He states that despite use of Lasix  80 mg twice daily he was not diuresing well at home.   Assessment & Plan:   Principal Problem:   Acute exacerbation of CHF (congestive heart failure) (HCC)  Acute exacerbation heart failure with preserved ejection fraction Reports weight gain of approximately 10 pound to 253 lb since last discharge (dry approximately 244 lb = 110 kg).  He had recent TTE last admission on 12/15 demonstrating LVEF 60 to 65%, severe concentric LVH, grade 2 diastolic dysfunction, biatrial enlargement, elevated RA pressure. Continued edema, particularly to RLE (less impressive to LLE) Continue aggressive diuresis, his LE edema remains tense, slowly improving Strict I/O, daily weights (unclear if weights accurate -> has been stable over reported dry weight for past few days, net negative 8.4 kg) Lasix  100 mg IV x 1 today, will reevaluate (slowly improving, will see if he might respond more to higher dose), trend creatinine (bumping up today, will monitor)  Right Lower Extremity Wounds Right Lower Extremity Swelling With his discomfort, will need to have low threshold to consider skin/soft tissue infection/abx, but sed rate not impressive.  Follow CRP (only mildly elevated) and procalcitonin (wnl).   Edema is mostly to that RLE, follow LE US  - negative for DVT Seems to be gradually improving with diuresis above    Elevated Troponin  Chest Discomfort Mild elevation, not c/w ACS Monitor - ? If chest discomfort related to volume status - seems to have improved with diuresis, will monitor  Shortness of Breath On 2 L o2, again, suspect contribution from volume/HF exacerbation Repeat CXR, follow with diuresis   CKD 2-3: Baseline creatinine approximately 1.2 - creatinine has been fluctuating, watch trend with diuresis, rising today  Hypokalemia Replace and follow    Chronic medical problems: Resistant hypertension: Continue home diltiazem , hydralazine , spironolactone , isosorbide .  Lisinopril  on hold.  History of aortic dissection, endovascular repair: Hypertensive management per above, outpatient follow-up surveillance CAD status post CABG, PAD: Continue aspirin , ? No statin on med list, + no fill hx, although OP records have listed as Atorvastatin  80 mg. 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 Obesity class II affecting medical care per above     DVT prophylaxis: eliquis  Code Status: full Family Communication: none Disposition:   Status is: Inpatient Remains inpatient appropriate because: need for continued inpatient care   Consultants:  none  Procedures:  none  Antimicrobials:  Anti-infectives (From admission, onward)    None       Subjective: No new complaints  Objective: Vitals:   09/07/23 2311 09/08/23 0604 09/08/23 0844 09/08/23 0850  BP:  118/68 114/65   Pulse: 64 (!) 59 60   Resp: 16 18    Temp:  97.7 F (36.5 C)  TempSrc:  Oral    SpO2: 93% 100% 95% 92%  Weight:  113.4 kg    Height:        Intake/Output Summary (Last 24 hours) at 09/08/2023 1001 Last data filed at 09/08/2023 0924 Gross per 24 hour  Intake 1560 ml  Output 1050 ml   Net 510 ml   Filed Weights   09/06/23 0500 09/07/23 0611 09/08/23 0604  Weight: 113.8 kg 113 kg 113.4 kg    Examination:  General: No acute distress. Cardiovascular: RRR Lungs: unlabored, CTAB Neurological: Alert and oriented 3. Moves all extremities 4 with equal strength. Cranial nerves II through XII grossly intact. Extremities: edema to RLE, gradually improving, but still notably swollen - dressing in place     Data Reviewed: I have personally reviewed following labs and imaging studies  CBC: Recent Labs  Lab 09/02/23 1827 09/05/23 0443 09/06/23 0418 09/07/23 0409 09/08/23 0500  WBC 5.4 6.4 5.6 6.1 5.4  NEUTROABS  --  4.1 3.4 3.2 2.7  HGB 9.6* 9.7* 10.0* 10.4* 10.1*  HCT 31.8* 32.4* 33.0* 35.7* 33.8*  MCV 97.2 97.6 97.3 97.0 96.0  PLT 155 151 164 172 180    Basic Metabolic Panel: Recent Labs  Lab 09/03/23 0435 09/04/23 0434 09/05/23 0443 09/06/23 0418 09/07/23 0409 09/08/23 0500  NA 141 142 141 138 140 137  K 3.4* 4.1 4.0 4.0 4.1 4.8  CL 103 104 102 100 102 100  CO2 32 29 31 32 30 30  GLUCOSE 94 106* 132* 91 105* 98  BUN 21 27* 26* 28* 29* 33*  CREATININE 1.47* 1.77* 1.63* 1.36* 1.32* 1.56*  CALCIUM  8.6* 8.8* 8.7* 8.7* 8.9 8.7*  MG 2.2 2.2 2.0  --  2.3 2.5*  PHOS 4.3  --  3.8 3.5 4.5 4.5    GFR: Estimated Creatinine Clearance: 58.8 mL/min (Connor [name]) (by C-G formula based on SCr of 1.56 mg/dL (H)).  Liver Function Tests: Recent Labs  Lab 09/04/23 1638 09/05/23 0443 09/06/23 0418 09/07/23 0409 09/08/23 0500  AST  --  17 16 17 17   ALT  --  12 11 13 12   ALKPHOS  --  77 70 78 72  BILITOT  --  0.6 0.8 0.7 0.7  PROT  --  7.4 7.5 7.4 7.2  ALBUMIN 3.8 3.6 3.7 3.6 3.6    CBG: No results for input(s): GLUCAP in the last 168 hours.   No results found for this or any previous visit (from the past 240 hours).       Radiology Studies: No results found.       Scheduled Meds:  apixaban   5 mg Oral BID   aspirin  EC  81 mg Oral Daily    atorvastatin   80 mg Oral Daily   diltiazem   240 mg Oral Daily   gabapentin   400 mg Oral TID   hydrALAZINE   50 mg Oral Q8H   isosorbide  mononitrate  30 mg Oral Daily   labetalol   400 mg Oral BID   pneumococcal 20-valent conjugate vaccine  0.5 mL Intramuscular Tomorrow-1000   potassium chloride  SA  40 mEq Oral BID   sodium chloride  flush  3 mL Intravenous Q12H   spironolactone   50 mg Oral Daily   traZODone   100 mg Oral QHS   Continuous Infusions:   LOS: 5 days    Time spent: over 30 min     Meliton Monte, MD [facility]   To contact the attending provider between 7A-7P or the covering provider during after hours  7P-7A, please log into the web site [email or web address] and access using universal [place] password for that web site. If you do not have the password, please call the hospital operator.  09/08/2023, 10:01 AM

## 2023-09-09 DIAGNOSIS — I5031 Acute diastolic (congestive) heart failure: Secondary | ICD-10-CM | POA: Diagnosis not present

## 2023-09-09 LAB — CBC WITH DIFFERENTIAL/PLATELET
Abs Immature Granulocytes: 0.01 10*3/uL (ref 0.00–0.07)
Basophils Absolute: 0 10*3/uL (ref 0.0–0.1)
Basophils Relative: 1 %
Eosinophils Absolute: 0.4 10*3/uL (ref 0.0–0.5)
Eosinophils Relative: 8 %
HCT: 34.2 % — ABNORMAL LOW (ref 39.0–52.0)
Hemoglobin: 9.8 g/dL — ABNORMAL LOW (ref 13.0–17.0)
Immature Granulocytes: 0 %
Lymphocytes Relative: 27 %
Lymphs Abs: 1.4 10*3/uL (ref 0.7–4.0)
MCH: 28 pg (ref 26.0–34.0)
MCHC: 28.7 g/dL — ABNORMAL LOW (ref 30.0–36.0)
MCV: 97.7 fL (ref 80.0–100.0)
Monocytes Absolute: 0.8 10*3/uL (ref 0.1–1.0)
Monocytes Relative: 14 %
Neutro Abs: 2.6 10*3/uL (ref 1.7–7.7)
Neutrophils Relative %: 50 %
Platelets: 176 10*3/uL (ref 150–400)
RBC: 3.5 MIL/uL — ABNORMAL LOW (ref 4.22–5.81)
RDW: 15.2 % (ref 11.5–15.5)
WBC: 5.2 10*3/uL (ref 4.0–10.5)
nRBC: 0 % (ref 0.0–0.2)

## 2023-09-09 LAB — COMPREHENSIVE METABOLIC PANEL
ALT: 10 U/L (ref 0–44)
AST: 14 U/L — ABNORMAL LOW (ref 15–41)
Albumin: 3.5 g/dL (ref 3.5–5.0)
Alkaline Phosphatase: 74 U/L (ref 38–126)
Anion gap: 8 (ref 5–15)
BUN: 39 mg/dL — ABNORMAL HIGH (ref 8–23)
CO2: 27 mmol/L (ref 22–32)
Calcium: 8.4 mg/dL — ABNORMAL LOW (ref 8.9–10.3)
Chloride: 103 mmol/L (ref 98–111)
Creatinine, Ser: 1.72 mg/dL — ABNORMAL HIGH (ref 0.61–1.24)
GFR, Estimated: 43 mL/min — ABNORMAL LOW (ref >60)
Glucose, Bld: 98 mg/dL (ref 70–99)
Potassium: 4.9 mmol/L (ref 3.5–5.1)
Sodium: 138 mmol/L (ref 135–145)
Total Bilirubin: 0.6 mg/dL (ref 0.0–1.2)
Total Protein: 6.9 g/dL (ref 6.5–8.1)

## 2023-09-09 LAB — MAGNESIUM: Magnesium: 2.6 mg/dL — ABNORMAL HIGH (ref 1.7–2.4)

## 2023-09-09 LAB — PHOSPHORUS: Phosphorus: 5.2 mg/dL — ABNORMAL HIGH (ref 2.5–4.6)

## 2023-09-09 MED ORDER — TORSEMIDE 20 MG PO TABS
40.0000 mg | ORAL_TABLET | Freq: Two times a day (BID) | ORAL | Status: DC
  Administered 2023-09-09 – 2023-09-10 (×3): 40 mg via ORAL
  Filled 2023-09-09 (×3): qty 2

## 2023-09-09 NOTE — Plan of Care (Signed)
   Problem: Nutrition: Goal: Adequate nutrition will be maintained Outcome: Progressing   Problem: Coping: Goal: Level of anxiety will decrease Outcome: Progressing

## 2023-09-09 NOTE — Progress Notes (Signed)
 PROGRESS NOTE    Connor [name]  FMW:[id number] DOB: 01-21-1956 DOA: 09/02/2023 PCP: Myra Geni ORN, FNP  Chief Complaint  Patient presents with   Chest Pain    Brief Narrative:   Connor [name] is Connor [name] 68 y.o. 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  He states that despite use of Lasix  80 mg twice daily he was not diuresing well at home.   Assessment & Plan:   Principal Problem:   Acute exacerbation of CHF (congestive heart failure) (HCC)  Acute exacerbation heart failure with preserved ejection fraction Reports weight gain of approximately 10 pound to 253 lb since last discharge (dry approximately 244 lb = 110 kg).  He had recent TTE last admission on 12/15 demonstrating LVEF 60 to 65%, severe concentric LVH, grade 2 diastolic dysfunction, biatrial enlargement, elevated RA pressure. Continued edema, particularly to RLE (less impressive to LLE) Continue aggressive diuresis, his LE edema remains tense, slowly improving Strict I/O, daily weights (unclear if weights accurate -> has been stable over reported dry weight for past few days, net negative 10.3 kg) With bumping creatinine, will transition to oral diuretics, notably he still does have some edema to go and is above his dry weight.  Right Lower Extremity Wounds Right Lower Extremity Swelling With his discomfort, will need to have low threshold to consider skin/soft tissue infection/abx, but sed rate not impressive.  Follow CRP (only mildly elevated) and procalcitonin (wnl).   Edema is mostly to that RLE, follow LE US  - negative for DVT Seems to be gradually improving with diuresis above Appreciate wound care recs    Elevated Troponin  Chest Discomfort Mild elevation, not c/w ACS Monitor - ? If chest discomfort related to volume status - seems to have improved with diuresis, will monitor  Shortness of Breath On 2 L o2, again, suspect contribution from volume/HF exacerbation Repeat CXR, follow with diuresis   CKD 2-3: Baseline creatinine approximately 1.2 - creatinine has been fluctuating, watch trend with diuresis, rising today  Hypokalemia Replace and follow    Chronic medical problems: Resistant hypertension: Continue home diltiazem , hydralazine , spironolactone , isosorbide .  Lisinopril  on hold.  History of aortic dissection, endovascular repair: Hypertensive management per above, outpatient follow-up surveillance CAD status post CABG, PAD: Continue aspirin , ? No statin on med list, + no fill hx, although OP records have listed as Atorvastatin  80 mg. 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 Obesity class II affecting medical care per above     DVT prophylaxis: eliquis  Code Status: full Family Communication: none Disposition:   Status is: Inpatient Remains inpatient appropriate because: need for continued inpatient care   Consultants:  none  Procedures:  none  Antimicrobials:  Anti-infectives (From admission, onward)    None       Subjective: No new complaints  Objective: Vitals:   09/08/23 2331 09/09/23 0536 09/09/23 0620 09/09/23 0800  BP:  115/68  (!) 123/57  Pulse: (!) 58 (!) 58  60  Resp: 20 18    Temp:  98 F (36.7 C)  TempSrc:  Oral    SpO2:  97%  96%  Weight:   113.4 kg   Height:        Intake/Output Summary (Last 24 hours) at 09/09/2023 0941 Last data filed at 09/09/2023 0800 Gross per 24 hour  Intake 303 ml  Output 2175 ml  Net  -1872 ml   Filed Weights   09/07/23 0611 09/08/23 0604 09/09/23 0620  Weight: 113 kg 113.4 kg 113.4 kg    Examination:  General: No acute distress. Cardiovascular: RRR Lungs: unlabored Neurological: Alert and oriented 3. Moves all extremities 4 with equal strength. Cranial nerves II through XII grossly intact. Extremities:  persistent wounds to RLE, persistent, but overall improved RLE edema    Data Reviewed: I have personally reviewed following labs and imaging studies  CBC: Recent Labs  Lab 09/05/23 0443 09/06/23 0418 09/07/23 0409 09/08/23 0500 09/09/23 0408  WBC 6.4 5.6 6.1 5.4 5.2  NEUTROABS 4.1 3.4 3.2 2.7 2.6  HGB 9.7* 10.0* 10.4* 10.1* 9.8*  HCT 32.4* 33.0* 35.7* 33.8* 34.2*  MCV 97.6 97.3 97.0 96.0 97.7  PLT 151 164 172 180 176    Basic Metabolic Panel: Recent Labs  Lab 09/04/23 0434 09/05/23 0443 09/06/23 0418 09/07/23 0409 09/08/23 0500 09/09/23 0408  NA 142 141 138 140 137 138  K 4.1 4.0 4.0 4.1 4.8 4.9  CL 104 102 100 102 100 103  CO2 29 31 32 30 30 27   GLUCOSE 106* 132* 91 105* 98 98  BUN 27* 26* 28* 29* 33* 39*  CREATININE 1.77* 1.63* 1.36* 1.32* 1.56* 1.72*  CALCIUM  8.8* 8.7* 8.7* 8.9 8.7* 8.4*  MG 2.2 2.0  --  2.3 2.5* 2.6*  PHOS  --  3.8 3.5 4.5 4.5 5.2*    GFR: Estimated Creatinine Clearance: 53.3 mL/min (Demondre Aguas) (by C-G formula based on SCr of 1.72 mg/dL (H)).  Liver Function Tests: Recent Labs  Lab 09/05/23 0443 09/06/23 0418 09/07/23 0409 09/08/23 0500 09/09/23 0408  AST 17 16 17 17  14*  ALT 12 11 13 12 10   ALKPHOS 77 70 78 72 74  BILITOT 0.6 0.8 0.7 0.7 0.6  PROT 7.4 7.5 7.4 7.2 6.9  ALBUMIN 3.6 3.7 3.6 3.6 3.5    CBG: No results for input(s): GLUCAP in the last 168 hours.   No results found for this or any previous visit (from the past 240 hours).       Radiology Studies: DG CHEST PORT 1 VIEW Result Date: 09/08/2023 CLINICAL DATA:  Shortness of breath EXAM: PORTABLE CHEST 1 VIEW COMPARISON:  09/04/2023  FINDINGS: Stable appearance of aortic stents and median sternotomy. Aortic arch and descending thoracic aortic aneurysm. The patient is rotated to the left on today's radiograph, reducing diagnostic sensitivity and specificity. Prominence of the right upper mediastinal margin likely attributable to vascular tortuosity. Newly indistinct left heart border suspicious for lingular airspace opacity. Hazy opacity over the left cardiac shadow with indistinct left costophrenic angle raising the possibility of left pleural effusion and potentially some left lower lobe airspace opacity. Mild enlargement of the cardiopericardial silhouette. Upper zone pulmonary vascular prominence favoring pulmonary venous hypertension. IMPRESSION: 1. Newly indistinct left heart border suspicious for lingular airspace opacity. 2. Hazy opacity over the left cardiac shadow with indistinct left costophrenic angle raising the possibility of left pleural effusion and potentially some left lower lobe airspace opacity. 3. Mild enlargement of the cardiopericardial silhouette with upper zone pulmonary vascular prominence favoring pulmonary venous hypertension. 4. Stable appearance of aortic stents and  median sternotomy. Aortic arch and descending thoracic aortic aneurysm. Electronically Signed   By: Ryan Salvage M.D.   On: 09/08/2023 14:14         Scheduled Meds:  apixaban   5 mg Oral BID   aspirin  EC  81 mg Oral Daily   atorvastatin   80 mg Oral Daily   diltiazem   240 mg Oral Daily   gabapentin   400 mg Oral TID   hydrALAZINE   50 mg Oral Q8H   isosorbide  mononitrate  30 mg Oral Daily   labetalol   400 mg Oral BID   pneumococcal 20-valent conjugate vaccine  0.5 mL Intramuscular Tomorrow-1000   potassium chloride  SA  40 mEq Oral BID   sodium chloride  flush  3 mL Intravenous Q12H   spironolactone   50 mg Oral Daily   torsemide   40 mg Oral BID   traZODone   100 mg Oral QHS   Continuous Infusions:   LOS: 6 days    Time spent:  over 30 min     Meliton Monte, MD [facility]   To contact the attending provider between 7A-7P or the covering provider during after hours 7P-7A, please log into the web site [email or web address] and access using universal Stewart password for that web site. If you do not have the password, please call the hospital operator.  09/09/2023, 9:41 AM

## 2023-09-09 NOTE — Progress Notes (Signed)
 Pt deferred daily dressing change til later in the AM as he would like to rest for now. Last dressing change was yesterday around 0930. No acute events overnight. Kellogg RN

## 2023-09-10 DIAGNOSIS — I5031 Acute diastolic (congestive) heart failure: Secondary | ICD-10-CM | POA: Diagnosis not present

## 2023-09-10 LAB — CBC WITH DIFFERENTIAL/PLATELET
Abs Immature Granulocytes: 0.01 10*3/uL (ref 0.00–0.07)
Basophils Absolute: 0.1 10*3/uL (ref 0.0–0.1)
Basophils Relative: 1 %
Eosinophils Absolute: 0.3 10*3/uL (ref 0.0–0.5)
Eosinophils Relative: 7 %
HCT: 33.5 % — ABNORMAL LOW (ref 39.0–52.0)
Hemoglobin: 10 g/dL — ABNORMAL LOW (ref 13.0–17.0)
Immature Granulocytes: 0 %
Lymphocytes Relative: 25 %
Lymphs Abs: 1.3 10*3/uL (ref 0.7–4.0)
MCH: 28.7 pg (ref 26.0–34.0)
MCHC: 29.9 g/dL — ABNORMAL LOW (ref 30.0–36.0)
MCV: 96 fL (ref 80.0–100.0)
Monocytes Absolute: 0.7 10*3/uL (ref 0.1–1.0)
Monocytes Relative: 13 %
Neutro Abs: 2.9 10*3/uL (ref 1.7–7.7)
Neutrophils Relative %: 54 %
Platelets: 185 10*3/uL (ref 150–400)
RBC: 3.49 MIL/uL — ABNORMAL LOW (ref 4.22–5.81)
RDW: 15.4 % (ref 11.5–15.5)
WBC: 5.3 10*3/uL (ref 4.0–10.5)
nRBC: 0 % (ref 0.0–0.2)

## 2023-09-10 LAB — COMPREHENSIVE METABOLIC PANEL
ALT: 12 U/L (ref 0–44)
AST: 17 U/L (ref 15–41)
Albumin: 3.5 g/dL (ref 3.5–5.0)
Alkaline Phosphatase: 74 U/L (ref 38–126)
Anion gap: 9 (ref 5–15)
BUN: 39 mg/dL — ABNORMAL HIGH (ref 8–23)
CO2: 28 mmol/L (ref 22–32)
Calcium: 8.6 mg/dL — ABNORMAL LOW (ref 8.9–10.3)
Chloride: 101 mmol/L (ref 98–111)
Creatinine, Ser: 1.72 mg/dL — ABNORMAL HIGH (ref 0.61–1.24)
GFR, Estimated: 43 mL/min — ABNORMAL LOW (ref >60)
Glucose, Bld: 97 mg/dL (ref 70–99)
Potassium: 4.7 mmol/L (ref 3.5–5.1)
Sodium: 138 mmol/L (ref 135–145)
Total Bilirubin: 0.8 mg/dL (ref 0.0–1.2)
Total Protein: 7.2 g/dL (ref 6.5–8.1)

## 2023-09-10 LAB — PHOSPHORUS: Phosphorus: 5.4 mg/dL — ABNORMAL HIGH (ref 2.5–4.6)

## 2023-09-10 LAB — MAGNESIUM: Magnesium: 2.5 mg/dL — ABNORMAL HIGH (ref 1.7–2.4)

## 2023-09-10 MED ORDER — TORSEMIDE 40 MG PO TABS: ORAL_TABLET | ORAL | 0 refills | Status: AC

## 2023-09-10 MED ORDER — ATORVASTATIN CALCIUM 80 MG PO TABS: 80.0000 mg | ORAL_TABLET | Freq: Every day | ORAL | 1 refills | Status: AC

## 2023-09-10 NOTE — TOC Transition Note (Signed)
 Transition of Care [facility]) - Discharge Note   Patient Details  Name: Connor [name] MRN: [id number] Date of Birth: Mar 16, 1956  [facility] CM/SW Contact:  Rollo Petri, LCSW Phone Number: 09/10/2023, 12:50 PM   Clinical Narrative:     Pt stable for dc per MD. [facility] RN/PT ordered. Pt agreeable. Referred to [facility]. No other TOC needs for dc.  Final next level of care: Home w Home Health Services Barriers to Discharge: Barriers Resolved   Patient Goals and CMS Choice Patient states their goals for this hospitalization and ongoing recovery are:: return home          Discharge Placement                       Discharge Plan and Services Additional resources added to the After Visit Summary for   In-house Referral: Clinical Social Work                        HH Arranged: CHARITY FUNDRAISER, PT [facility] Agency: [facility] Date [facility] Agency Contacted: 09/10/23   Representative spoke with at [facility] Agency: Darleene  Social Drivers of Health (SDOH) Interventions SDOH Screenings   Food Insecurity: No Food Insecurity (09/03/2023)  Housing: Low Risk  (09/03/2023)  Transportation Needs: No Transportation Needs (09/03/2023)  Utilities: Not At Risk (09/03/2023)  Recent Concern: Utilities - At Risk (08/10/2023)  Financial Resource Strain: Low Risk  (02/06/2021)   Received from [facility]  Social Connections: Moderately Integrated (09/03/2023)  Tobacco Use: Medium Risk (09/02/2023)     Readmission Risk Interventions    09/06/2023   10:17 AM  Readmission Risk Prevention Plan  Transportation Screening Complete  HRI or Home Care Consult Complete  Social Work Consult for Recovery Care Planning/Counseling Complete  Palliative Care Screening Not Applicable  Medication Review [employer]) Complete

## 2023-09-10 NOTE — Progress Notes (Signed)
   09/10/23 0800  ReDS Vest / Clip  Station Marker D  Ruler Value 40  ReDS Value Range (!) > 40  ReDS Actual Value 46

## 2023-09-10 NOTE — Care Management Important Message (Signed)
 Important Message  Patient Details  Name: Connor [name] MRN: [id number] Date of Birth: 07/05/1956   Important Message Given:  Yes - Medicare IM     Corey Harold 09/10/2023, 11:33 AM

## 2023-09-10 NOTE — Plan of Care (Signed)
   Problem: Education: Goal: Knowledge of General Education information will improve Description: Including pain rating scale, medication(s)/side effects and non-pharmacologic comfort measures Outcome: Progressing   Problem: Clinical Measurements: Goal: Will remain free from infection Outcome: Progressing Goal: Diagnostic test results will improve Outcome: Progressing

## 2023-09-10 NOTE — Discharge Summary (Signed)
 Physician Discharge Summary  Connor [name] Connor [name] DOB: 08-Jun-1956 DOA: 09/02/2023  PCP: Myra Geni ORN, FNP  Admit date: 09/02/2023 Discharge date: 09/10/2023  Time spent: 40 minutes  Recommendations for Outpatient Follow-up:  Follow outpatient CBC/CMP  Follow with PCP outpatient - will need adjustment of diuretic regimen, with elevated creatinine, continuing torsemide  BID x5 days, then transitioning to daily 40 mg torsemide .  Weight at discharge is 245, but his dry weight may be Connor [name] little lower as he does still have some swelling to his RLE.   Follow with wound clinic for right lower extremity wounds Follow renal function  with continued diuresis - lisinopril  on hold, follow blood pressure outpatient   Discharge Diagnoses:  Principal Problem:   Acute exacerbation of CHF (congestive heart failure) (HCC)   Discharge Condition: stable  Diet recommendation: heart healthy, low sodium   Filed Weights   09/08/23 0604 09/09/23 0620 09/10/23 0500  Weight: 113.4 kg 113.4 kg 111.3 kg    History of present illness:  Connor [name] is Connor [name] 68 y.o. 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   He's improved with diuresis.  Plan for discharge on torsemide .    See below for additional details   Hospital Course:  Assessment and Plan:  Acute exacerbation heart failure with preserved ejection fraction Reports weight gain of approximately 10 pound to 253 lb since last discharge (dry approximately 244 lb = 110 kg).  He had recent TTE last admission on 12/15 demonstrating LVEF 60 to 65%, severe concentric LVH, grade 2 diastolic dysfunction, biatrial enlargement, elevated RA  pressure. Improved with diuresis, 245 lbs today, still has Connor [name] bit of edema to his lower leg, but I think with improvement stable for discharge and outpatient management Strict I/O, daily weights (unclear if weights accurate -> has been stable over reported dry weight for past few days, net negative 10.3 kg) Diuresed well with BID torsemide .  Will continue BID torsemide  at discharge for another 5 days, then transition to daily torsemide .  Will need to follow volume status/creatinine/lytes with PCP outpatient to fine tune regimen Connor [name] bit more (whether to continue daily vs BID long term), but discussed need to weigh daily to manage volume.     Right Lower Extremity Wounds Right Lower Extremity Swelling With his discomfort, will need to have low threshold to consider skin/soft tissue infection/abx, but sed rate not impressive.  Follow CRP (only mildly elevated) and procalcitonin (wnl).   Edema is mostly to that RLE, follow LE US  - negative for DVT Seems to be gradually improving with diuresis above Appreciate wound care recs -> wound clinic referral   Elevated Troponin  Chest Discomfort Mild elevation, not c/w ACS Monitor - ? If chest discomfort related to volume status - improved with diuresis, follow    Shortness of Breath Improved with diuresis, currently on RA   CKD 2-3: Baseline creatinine approximately 1.2 - creatinine has been fluctuating, watch trend with diuresis, rising today   Hypokalemia Follow with diuresis - appropriate - will need follow up outpatient with more intense diuresis    Chronic medical problems: Resistant hypertension: Continue home labetalol , diltiazem , hydralazine , spironolactone , isosorbide .  Lisinopril  on hold with AKI.  Follow blood pressure outpatient.  Chronic bradycardia on labetalol  and dilt.  Follow closely outpatient.  He's asymptomatic.  History of aortic dissection, endovascular repair: Hypertensive management per above, outpatient follow-up  surveillance CAD status post CABG, PAD: Continue aspirin , ? No statin on med list, + no fill hx, although OP records have listed as Atorvastatin  80 mg. Resume on Atorvastatin .  Follow outpatient.  History left AKA from necrotizing fasciitis  Persistent [name]-fib: Continue home Eliquis   History anemia chronic disease: Check iron panel (low iron, normal ferritin), B12 (normal) OSA: CPAP at night   Body mass index is 37.31 kg/m. Obesity class II affecting medical care per above     Procedures: 07/2023 echo IMPRESSIONS     1. Left ventricular ejection fraction, by estimation, is 60 to 65%. The  left ventricle has normal function. The left ventricle has no regional  wall motion abnormalities. There is severe concentric left ventricular  hypertrophy. Left ventricular diastolic   parameters are consistent with Grade II diastolic dysfunction  (pseudonormalization). There is the interventricular septum is flattened  in systole and diastole, consistent with right ventricular pressure and  volume overload.   2. Right ventricular systolic function is normal. The right ventricular  size is normal.   3. Left atrial size was moderately dilated.   4. Right atrial size was moderately dilated.   5. The mitral valve is normal in structure. No evidence of mitral valve  regurgitation. No evidence of mitral stenosis.   6. The aortic valve is normal in structure. Aortic valve regurgitation is  not visualized. No aortic stenosis is present.   7. The inferior vena cava is dilated in size with <50% respiratory  variability, suggesting right atrial pressure of 15 mmHg.   Comparison(s): No significant change from prior study. Prior images  reviewed side by side.    Consultations: none  Discharge Exam: Vitals:   09/09/23 2053 09/10/23 0538  BP: 105/66 (!) 126/55  Pulse: 66 (!) 54  Resp: 18 16  Temp: 98.4 F (36.9 C) 98.1 F (36.7 C)  SpO2: 98% 98%   No complaints Eager to discharge today - feels  swelling is better than last time he was discharged  General: No acute distress. Cardiovascular: Heart sounds show Connor [name] regular rate, and rhythm.  Lungs: Clear to auscultation bilaterally with good air movement Neurological: Alert and oriented 3. Moves all extremities 4. Cranial nerves II through XII grossly intact. Skin: Warm and dry. No rashes or lesions. Extremities: improved RLE edema, still edema around shin, but thigh edema almost completely resolved.  Dressing intact.  L AKA>  Discharge Instructions   Discharge Instructions     (HEART FAILURE PATIENTS) Call MD:  Anytime you have any of the following symptoms: 1) 3 pound weight gain in 24 hours or 5 pounds in 1 week 2) shortness of breath, with or without Paizlee Kinder dry hacking cough 3) swelling in the hands, feet or stomach 4) if you have to sleep on extra pillows at night in order to breathe.   Complete by: As directed    AMB referral to wound care center   Complete by: As directed    Avoid straining   Complete by: As directed    Call MD for:  difficulty breathing, headache or visual disturbances   Complete by: As directed    Call MD for:  extreme fatigue   Complete by: As directed    Call MD for:  hives   Complete by: As directed    Call MD for:  persistant dizziness or light-headedness   Complete by:  As directed    Call MD for:  persistant nausea and vomiting   Complete by: As directed    Call MD for:  redness, tenderness, or signs of infection (pain, swelling, redness, odor or green/yellow discharge around incision site)   Complete by: As directed    Call MD for:  severe uncontrolled pain   Complete by: As directed    Call MD for:  temperature >100.4   Complete by: As directed    Diet - low sodium heart healthy   Complete by: As directed    Diet - low sodium heart healthy   Complete by: As directed    Discharge instructions   Complete by: As directed    You were seen for volume overload (Kazimierz Springborn heart failure exacerbation).     You've improved with diuresis.  We've adjusted your diuretics to Mikiala Fugett medicine called torsemide .  This is more potent than lasix .  You diuresed well (made Gerard Cantara lot of urine) with this on 09/09/2023.  Let's continue torsemide  40 mg twice Kristain Hu day at discharge, I think there's Shaterrica Territo little more fluid to pull off you.  Since your kidney function is not normal, you should repeat labs with your PCP within Melanni Benway week or so.  Take the torsemide  twice Thaine Garriga day for 5 days, then take torsemide  daily.  You should weigh yourself daily (your discharge weight is 245 lbs, but I suspect you still have Aeriel Boulay little extra fluid on you).  If your weight goes up by 2-3 lbs in Kimyah Frein day or by 5 lbs in Chaim Gatley week (or if you notice worsening swelling), take an extra dose of torsemide .   We stopped your lisinopril  due to your kidney function.  Follow with your PCP to see if this can be resumed.  Your heart rate was on the slow side, stop your diltiazem , continue your labetalol  for now.  Follow up with your PCP or cardiologist to determine if you need additional adjustments.    For your nonhealing wounds, continue wound care as prescribed here.  We'll refer you to the wound care clinic.  Return for new, recurrent, or worsening symptoms.  Please ask your PCP to request records from this hospitalization so they know what was done and what the next steps will be.   Discharge wound care:   Complete by: As directed    Clean right leg wound with Vashe wound cleanser Soila [phone]), apply Xeroform gauze ([place]son [phone]) to wound bed daily, cover Xeroform with ABD pad or Telfa nonstick pad then wrap leg with Kerlix roll gauze beginning right above toes and ending right below knee.  May secure with Ace bandage wrapped in same fashion as Kerlix for light compression.   Heart Failure patients record your daily weight using the same scale at the same time of day   Complete by: As directed    Increase activity slowly   Complete by: As directed    Increase activity  slowly   Complete by: As directed    STOP any activity that causes chest pain, shortness of breath, dizziness, sweating, or exessive weakness   Complete by: As directed       Allergies as of 09/10/2023   No Known Allergies      Medication List     STOP taking these medications    Cartia  XT 240 MG 24 hr capsule Generic drug: diltiazem    furosemide  40 MG tablet Commonly known as: LASIX    lisinopril  20 MG tablet Commonly known as:  ZESTRIL        TAKE these medications    acetaminophen  325 MG tablet Commonly known as: TYLENOL  Take 2 tablets (650 mg total) by mouth every 6 (six) hours as needed for moderate pain.   aspirin  EC 81 MG tablet Take 81 mg by mouth daily. Swallow whole.   atorvastatin  80 MG tablet Commonly known as: LIPITOR Take 1 tablet (80 mg total) by mouth daily. Start taking on: September 11, 2023   Eliquis  5 MG Tabs tablet Generic drug: apixaban  TAKE 1 TABLET (5 MG TOTAL) BY MOUTH 2 (TWO) TIMES DAILY.   gabapentin  400 MG capsule Commonly known as: NEURONTIN  TAKE 1 CAPSULE (400 MG TOTAL) BY MOUTH THREE TIMES DAILY.   hydrALAZINE  50 MG tablet Commonly known as: APRESOLINE  Take 1 tablet by mouth in the morning, at noon, and at bedtime.   isosorbide  mononitrate 30 MG 24 hr tablet Commonly known as: IMDUR  Take 1 tablet (30 mg total) by mouth daily.   labetalol  200 MG tablet Commonly known as: NORMODYNE  TAKE 2 TABLETS (400 MG TOTAL) BY MOUTH TWO TIMES DAILY.   multivitamin with minerals tablet Take 1 tablet by mouth daily. Men   potassium chloride  SA 20 MEQ tablet Commonly known as: KLOR-CON  M Take 20 mEq by mouth daily.   spironolactone  50 MG tablet Commonly known as: ALDACTONE  Take 1 tablet (50 mg total) by mouth daily.   Torsemide  40 MG Tabs Take 40 mg by mouth 2 (two) times daily for 5 days, THEN 40 mg daily. You'll need repeat labs within Tarhonda Hollenberg week to ensure your kidney function and electrolytes are stable.  Weigh yourself daily.  If your  weight is increasing by 2-3 lbs in Ahliyah Nienow day or by 5 lbs in Azariya Freeman week take an extra dose of torsemide  and call your PCP or cardiologist for additional instructions regarding your torsemide .. Start taking on: September 10, 2023   traZODone  100 MG tablet Commonly known as: DESYREL  Take 100 mg by mouth at bedtime.               Discharge Care Instructions  (From admission, onward)           Start     Ordered   09/10/23 0000  Discharge wound care:       Comments: Clean right leg wound with Vashe wound cleanser Soila [phone]), apply Xeroform gauze Soila [phone]) to wound bed daily, cover Xeroform with ABD pad or Telfa nonstick pad then wrap leg with Kerlix roll gauze beginning right above toes and ending right below knee.  May secure with Ace bandage wrapped in same fashion as Kerlix for light compression.   09/10/23 1217           No Known Allergies    The results of significant diagnostics from this hospitalization (including imaging, microbiology, ancillary and laboratory) are listed below for reference.    Significant Diagnostic Studies: DG CHEST PORT 1 VIEW Result Date: 09/08/2023 CLINICAL DATA:  Shortness of breath EXAM: PORTABLE CHEST 1 VIEW COMPARISON:  09/04/2023 FINDINGS: Stable appearance of aortic stents and median sternotomy. Aortic arch and descending thoracic aortic aneurysm. The patient is rotated to the left on today's radiograph, reducing diagnostic sensitivity and specificity. Prominence of the right upper mediastinal margin likely attributable to vascular tortuosity. Newly indistinct left heart border suspicious for lingular airspace opacity. Hazy opacity over the left cardiac shadow with indistinct left costophrenic angle raising the possibility of left pleural effusion and potentially some left lower lobe airspace opacity. Mild  enlargement of the cardiopericardial silhouette. Upper zone pulmonary vascular prominence favoring pulmonary venous hypertension. IMPRESSION:  1. Newly indistinct left heart border suspicious for lingular airspace opacity. 2. Hazy opacity over the left cardiac shadow with indistinct left costophrenic angle raising the possibility of left pleural effusion and potentially some left lower lobe airspace opacity. 3. Mild enlargement of the cardiopericardial silhouette with upper zone pulmonary vascular prominence favoring pulmonary venous hypertension. 4. Stable appearance of aortic stents and median sternotomy. Aortic arch and descending thoracic aortic aneurysm. Electronically Signed   By: Ryan Salvage M.D.   On: 09/08/2023 14:14   DG CHEST PORT 1 VIEW Result Date: 09/04/2023 CLINICAL DATA:  Fluid overload EXAM: PORTABLE CHEST 1 VIEW COMPARISON:  09/02/2023 FINDINGS: Cardiac shadow is enlarged. Aortic stent graft is noted in the descending thoracic aorta. Postsurgical changes are seen. Mild vascular congestion is noted stable from the prior exam. No significant edema is noted. No focal infiltrate or effusion is noted. No bony abnormality is seen. IMPRESSION: Vascular congestion without focal infiltrate. Electronically Signed   By: [name] M.D.   On: 09/04/2023 17:59   US  Venous Img Lower Unilateral Right (DVT) Result Date: 09/04/2023 CLINICAL DATA:  Right lower extremity edema EXAM: RIGHT LOWER EXTREMITY VENOUS DOPPLER ULTRASOUND TECHNIQUE: Gray-scale sonography with graded compression, as well as color Doppler and duplex ultrasound were performed to evaluate the lower extremity deep venous systems from the level of the common femoral vein and including the common femoral, femoral, profunda femoral, popliteal and calf veins including the posterior tibial, peroneal and gastrocnemius veins when visible. The superficial great saphenous vein was also interrogated. Spectral Doppler was utilized to evaluate flow at rest and with distal augmentation maneuvers in the common femoral, femoral and popliteal veins. COMPARISON:  None Available. FINDINGS:  Contralateral Common Femoral Vein: Respiratory phasicity is normal and symmetric with the symptomatic side. No evidence of thrombus. Normal compressibility. Common Femoral Vein: No evidence of thrombus. Normal compressibility, respiratory phasicity and response to augmentation. Saphenofemoral Junction: No evidence of thrombus. Normal compressibility and flow on color Doppler imaging. Profunda Femoral Vein: No evidence of thrombus. Normal compressibility and flow on color Doppler imaging. Femoral Vein: No evidence of thrombus. Normal compressibility, respiratory phasicity and response to augmentation. Popliteal Vein: No evidence of thrombus. Normal compressibility, respiratory phasicity and response to augmentation. Calf Veins: No evidence of thrombus. Normal compressibility and flow on color Doppler imaging. Peroneal veins are not well visualized. Superficial Great Saphenous Vein: No evidence of thrombus. Normal compressibility. Venous Reflux:  None. Other Findings:  None. IMPRESSION: No evidence of deep venous thrombosis. Electronically Signed   By: [name] M.D.   On: 09/04/2023 17:58   DG Chest 2 View Result Date: 09/02/2023 CLINICAL DATA:  Heart failure, chest pain EXAM: CHEST - 2 VIEW COMPARISON:  08/10/2023 FINDINGS: Gross cardiomegaly. Status post median sternotomy as well as stent endograft repair of the aortic arch and descending thoracic aorta, unchanged in appearance. Pulmonary vascular prominence and mild diffuse interstitial opacity, similar to prior. No acute osseous findings. IMPRESSION: 1. Gross cardiomegaly with pulmonary vascular prominence and mild diffuse interstitial opacity, similar to prior, consistent with edema. No new or focal airspace opacity. 2. Status post median sternotomy as well as stent endograft repair of the aortic arch and descending thoracic aorta, unchanged in appearance. Electronically Signed   By: Marolyn JONETTA Jaksch M.D.   On: 09/02/2023 17:39    Microbiology: No results  found for this or any previous visit (from the past 240  hours).   Labs: Basic Metabolic Panel: Recent Labs  Lab 09/05/23 0443 09/06/23 0418 09/07/23 0409 09/08/23 0500 09/09/23 0408 09/10/23 0403  NA 141 138 140 137 138 138  K 4.0 4.0 4.1 4.8 4.9 4.7  CL 102 100 102 100 103 101  CO2 31 32 30 30 27 28   GLUCOSE 132* 91 105* 98 98 97  BUN 26* 28* 29* 33* 39* 39*  CREATININE 1.63* 1.36* 1.32* 1.56* 1.72* 1.72*  CALCIUM  8.7* 8.7* 8.9 8.7* 8.4* 8.6*  MG 2.0  --  2.3 2.5* 2.6* 2.5*  PHOS 3.8 3.5 4.5 4.5 5.2* 5.4*   Liver Function Tests: Recent Labs  Lab 09/06/23 0418 09/07/23 0409 09/08/23 0500 09/09/23 0408 09/10/23 0403  AST 16 17 17  14* 17  ALT 11 13 12 10 12   ALKPHOS 70 78 72 74 74  BILITOT 0.8 0.7 0.7 0.6 0.8  PROT 7.5 7.4 7.2 6.9 7.2  ALBUMIN 3.7 3.6 3.6 3.5 3.5   No results for input(s): LIPASE, AMYLASE in the last 168 hours. No results for input(s): AMMONIA in the last 168 hours. CBC: Recent Labs  Lab 09/06/23 0418 09/07/23 0409 09/08/23 0500 09/09/23 0408 09/10/23 0403  WBC 5.6 6.1 5.4 5.2 5.3  NEUTROABS 3.4 3.2 2.7 2.6 2.9  HGB 10.0* 10.4* 10.1* 9.8* 10.0*  HCT 33.0* 35.7* 33.8* 34.2* 33.5*  MCV 97.3 97.0 96.0 97.7 96.0  PLT 164 172 180 176 185   Cardiac Enzymes: No results for input(s): CKTOTAL, CKMB, CKMBINDEX, TROPONINI in the last 168 hours. BNP: BNP (last 3 results) Recent Labs    09/06/23 0418 09/07/23 0409 09/08/23 0500  BNP 88.0 97.0 53.0    ProBNP (last 3 results) No results for input(s): PROBNP in the last 8760 hours.  CBG: No results for input(s): GLUCAP in the last 168 hours.     Signed:  Meliton Monte MD.  [facility] 09/10/2023, 12:23 PM

## 2023-09-24 ENCOUNTER — Encounter: Payer: Self-pay | Admitting: Cardiology

## 2023-09-24 ENCOUNTER — Ambulatory Visit: Payer: 59 | Attending: Cardiology | Admitting: Cardiology

## 2023-09-24 VITALS — BP 154/93 | HR 73 | Ht 70.0 in | Wt 243.0 lb

## 2023-09-24 DIAGNOSIS — I7100 Dissection of unspecified site of aorta: Secondary | ICD-10-CM | POA: Diagnosis not present

## 2023-09-24 DIAGNOSIS — G4733 Obstructive sleep apnea (adult) (pediatric): Secondary | ICD-10-CM | POA: Diagnosis not present

## 2023-09-24 DIAGNOSIS — I1A0 Resistant hypertension: Secondary | ICD-10-CM | POA: Diagnosis not present

## 2023-09-24 DIAGNOSIS — I5032 Chronic diastolic (congestive) heart failure: Secondary | ICD-10-CM

## 2023-09-24 DIAGNOSIS — I4819 Other persistent atrial fibrillation: Secondary | ICD-10-CM

## 2023-09-24 MED ORDER — ELIQUIS 5 MG PO TABS: 5.0000 mg | ORAL_TABLET | Freq: Two times a day (BID) | ORAL | 3 refills | Status: AC

## 2023-09-24 MED ORDER — LABETALOL HCL 200 MG PO TABS: ORAL_TABLET | ORAL | 3 refills | Status: AC

## 2023-09-24 NOTE — Progress Notes (Signed)
Cardiology Office Note:    Date:  09/24/2023   ID:  Connor [name], DOB 03/24/1956, MRN [id number]  PCP:  Oneal Grout, FNP  Cardiologist:  Little Ishikawa, MD  Electrophysiologist:  None   Referring MD: Oneal Grout, FNP   Chief Complaint  Patient presents with   Edema    Patient was in [facility] Jan 14 th 2025 through Jan 17 th 2025 for right leg swelling and SOB chest pain     History of Present Illness:    Connor [name] is a 68 y.o. male with a hx of aortic dissection who presents for follow-up.  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  He underwent left common and external iliac artery and left renal vein stents on 09/19/2018 in preparation for total aortic arch repair.  Course was complicated by cardiogenic shock (LVEF down to 15%, which has since recovered), AKI requiring CVVH (with subsequent recovery of renal function), extensive left upper extremity DVT.  On 10/03/2018, he underwent total arch replacement, frozen elephant trunk, and head vessel reconstruction with Dr.Takayama and Dr. Allena Katz.  Course was complicated by sternal wound infection, with cultures growing Klebsiella.  He completed a 14-day course of cefadroxil.  He was following closely with cardiology and nephrology in [place] for blood pressure control.  Moved to [place] in July to help with a family issue.   He was in the ED on 06/19/2019 with chest tightness and shortness of breath.  Troponins negative x2.  CTA chest showed patent graft of the ascending aortic component, patent graft of the proximal descending aorta with, thrombosis of the proximal descending thoracic aortic dissection false lumen along the stent graft, patent type B aortic dissection of the mid descending thoracic aorta extending to the abdominal aortic bifurcation.  Reports that he feels chest pain has been related to muscle cramps  from low potassium.  Now taking potassium supplements.  States that he also has chest pain across the incision in the right upper chest when he does stretches.  TTE on 10/02/2019 showed EF 60-65%, severe LVH, normal RV function, no significant valvular disease.  At clinic visit on 01/05/2020, he was found to be in atrial fibrillation.  Underwent successful DCCV on 02/11/2020.   He was admitted to [facility] from 1/4 through 09/19/2020 with necrotizing fasciitis of the left leg and strep bacteremia.  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  Echocardiogram 09/01/2020 showed EF 60 to 65%, severe LVH, grade 2 diastolic dysfunction, normal RV function, severe left atrial dilatation, moderate right atrial dilatation, no significant valvular disease.  TEE on 09/05/2020 showed no vegetations, positive bubble study.  He was admitted 11/2020 with uncontrolled hypertension and acute on chronic diastolic heart failure, improved with IV Lasix.  He was admitted in June 2022 to [facility] with type B aortic dissection and hypertensive crisis.  On 02/06/2021 he underwent TEVAR extension and left renal artery stenting.  He was admitted 08/2023 with acute on chronic diastolic heart failure.  He was diuresed, dry weight felt to be 244 pounds.  He was discharged on twice daily torsemide with plans to transition to daily torsemide after 5 days.  Also noted to have right lower extremity wounds, was referred to wound clinic.  Since his last clinic visit, he reports he is doing okay.  He is taking torsemide, reports it is keeping the fluid off.  He  denies any edema.  Reports dyspnea has improved.  Denies any chest pain, lightheadedness, or syncope.  Reports BP 120s over 80s when checks at home.   Wt Readings from Last 3 Encounters:  09/24/23 243 lb (110.2 kg)  09/10/23 245 lb 6 oz (111.3 kg)  08/13/23 244 lb 4.3 oz (110.8 kg)     Past Medical History:  Diagnosis Date   AAA (abdominal aortic aneurysm) (HCC)    History of open heart surgery     Hypertension    Seroma due to trauma [facility])     Past Surgical History:  Procedure Laterality Date   ABDOMINAL AORTIC ANEURYSM REPAIR     AMPUTATION Left 09/07/2020   Procedure: ATTEMPTED LEFT LEG DEBRIDEMENT FASCIOTOMIES, APPLY INSTILLATION WOUND VAC, ABOVE KNEE AMPUTATION;  Surgeon: Nadara Mustard, MD;  Location: [facility] OR;  Service: Orthopedics;  Laterality: Left;   BUBBLE STUDY  09/05/2020   Procedure: BUBBLE STUDY;  Surgeon: Meriam Sprague, MD;  Location: [facility] ENDOSCOPY;  Service: Cardiovascular;;   CARDIOVERSION N/A 02/11/2020   Procedure: CARDIOVERSION;  Surgeon: Little Ishikawa, MD;  Location: [facility] ENDOSCOPY;  Service: Cardiovascular;  Laterality: N/A;   CORONARY ARTERY BYPASS GRAFT     STUMP REVISION Left 09/09/2020   Procedure: REVISION LEFT ABOVE KNEE AMPUTATION;  Surgeon: Nadara Mustard, MD;  Location: [facility] OR;  Service: Orthopedics;  Laterality: Left;   TEE WITHOUT CARDIOVERSION N/A 09/05/2020   Procedure: TRANSESOPHAGEAL ECHOCARDIOGRAM (TEE);  Surgeon: Meriam Sprague, MD;  Location: [facility] ENDOSCOPY;  Service: Cardiovascular;  Laterality: N/A;    Current Medications: Current Meds  Medication Sig   acetaminophen (TYLENOL) 325 MG tablet Take 2 tablets (650 mg total) by mouth every 6 (six) hours as needed for moderate pain.   aspirin EC 81 MG tablet Take 81 mg by mouth daily. Swallow whole.   atorvastatin (LIPITOR) 80 MG tablet Take 1 tablet (80 mg total) by mouth daily.   hydrALAZINE (APRESOLINE) 50 MG tablet Take 1 tablet by mouth in the morning, at noon, and at bedtime.   isosorbide mononitrate (IMDUR) 30 MG 24 hr tablet Take 1 tablet (30 mg total) by mouth daily.   Multiple Vitamins-Minerals (MULTIVITAMIN WITH MINERALS) tablet Take 1 tablet by mouth daily. Men   spironolactone (ALDACTONE) 50 MG tablet Take 1 tablet (50 mg total) by mouth daily.   torsemide 40 MG TABS Take 40 mg by mouth 2 (two) times daily for 5 days, THEN 40 mg daily. You'll need repeat labs within a week  to ensure your kidney function and electrolytes are stable.  Weigh yourself daily.  If your weight is increasing by 2-3 lbs in a day or by 5 lbs in a week take an extra dose of torsemide and call your PCP or cardiologist for additional instructions regarding your torsemide..   traZODone (DESYREL) 100 MG tablet Take 100 mg by mouth at bedtime.     Allergies:   Patient has no known allergies.   Social History   Socioeconomic History   Marital status: Single    Spouse name: Not on file   Number of children: Not on file   Years of education: Not on file   Highest education level: Not on file  Occupational History   Not on file  Tobacco Use   Smoking status: Former    Current packs/day: 0.00    Types: Cigarettes    Quit date: 07/26/2023    Years since quitting: 0.1   Smokeless tobacco: Never  Vaping Use  Vaping status: Never Used  Substance and Sexual Activity   Alcohol use: Not Currently    Comment: occ   Drug use: Never   Sexual activity: Not Currently    Partners: Female  Other Topics Concern   Not on file  Social History Narrative   Not on file   Social Drivers of Health   Financial Resource Strain: Low Risk  (02/06/2021)   Received from [facility]   Overall Financial Resource Strain (CARDIA)    Difficulty of Paying Living Expenses: Not very hard  Food Insecurity: No Food Insecurity (09/03/2023)   Hunger Vital Sign    Worried About Running Out of Food in the Last Year: Never true    Ran Out of Food in the Last Year: Never true  Transportation Needs: No Transportation Needs (09/03/2023)   PRAPARE - Administrator, Civil Service (Medical): No    Lack of Transportation (Non-Medical): No  Physical Activity: Not on file  Stress: Not on file  Social Connections: Moderately Integrated (09/03/2023)   Social Connection and Isolation Panel [NHANES]    Frequency of Communication with Friends and Family: Twice a week    Frequency of Social  Gatherings with Friends and Family: Three times a week    Attends Religious Services: More than 4 times per year    Active Member of Clubs or Organizations: Yes    Attends Banker Meetings: More than 4 times per year    Marital Status: Never married     Family History: Mother died of MI at age 5.  Father had stroke at age 70  ROS:   Please see the history of present illness.     All other systems reviewed and are negative.  EKGs/Labs/Other Studies Reviewed:    The following studies were reviewed today:   EKG:   09/24/23: Sinus rhythm with PVC, left axis deviation, rate 73, QTc 460 11/07/21: Sinus rhythm, rate 99, first-degree AV block, left axis deviation, QTc 492, no ST abnormalities 07/27/2021: Sinus rhythm, rate 65, first-degree AV block chronic, left axis deviation, no ST abnormalities  TTE 10/02/19:  1. Left ventricular ejection fraction, by visual estimation, is 60 to  65%. The left ventricle has normal function. Left ventricular septal wall  thickness was severely increased. Severely increased left ventricular  posterior wall thickness. There is  severely increased left ventricular hypertrophy.   2. Left ventricular diastolic function could not be evaluated.   3. The left ventricle has no regional wall motion abnormalities.   4. Global right ventricle has normal systolic function.The right  ventricular size is normal. No increase in right ventricular wall  thickness.   5. Left atrial size was moderately dilated.   6. Right atrial size was normal.   7. The mitral valve is normal in structure. Trivial mitral valve  regurgitation. No evidence of mitral stenosis.   8. The tricuspid valve is normal in structure.   9. The tricuspid valve is normal in structure. Tricuspid valve  regurgitation is not demonstrated.  10. The aortic valve is tricuspid. Aortic valve regurgitation is not  visualized. No evidence of aortic valve sclerosis or stenosis.  11. The pulmonic  valve was normal in structure. Pulmonic valve  regurgitation is not visualized.  12. The inferior vena cava is normal in size with greater than 50%  respiratory variability, suggesting right atrial pressure of 3 mmHg.   Recent Labs: 09/08/2023: B Natriuretic Peptide 53.0 09/10/2023: ALT 12; BUN  39; Creatinine, Ser 1.72; Hemoglobin 10.0; Magnesium 2.5; Platelets 185; Potassium 4.7; Sodium 138  Recent Lipid Panel    Component Value Date/Time   CHOL 148 07/27/2021 1030   TRIG 50 07/27/2021 1030   HDL 46 07/27/2021 1030   CHOLHDL 3.2 07/27/2021 1030   CHOLHDL NOT CALCULATED 09/04/2020 1446   VLDL 35 09/04/2020 1446   LDLCALC 91 07/27/2021 1030    Physical Exam:    VS:  BP (!) 154/93   Pulse 73   Ht 5\' 10"  (1.778 m)   Wt 243 lb (110.2 kg)   SpO2 96%   BMI 34.87 kg/m     Wt Readings from Last 3 Encounters:  09/24/23 243 lb (110.2 kg)  09/10/23 245 lb 6 oz (111.3 kg)  08/13/23 244 lb 4.3 oz (110.8 kg)     GEN: Well nourished, well developed in no acute distress HEENT: Normal NECK: No JVD CARDIAC: RRR, 2/6 systolic murmur RESPIRATORY:  Clear to auscultation without rales, wheezing or rhonchi  ABDOMEN: Soft, non-tender, non-distended MUSCULOSKELETAL: Trace edema SKIN: Warm and dry NEUROLOGIC:  Alert and oriented x 3 PSYCHIATRIC:  Normal affect   ASSESSMENT:    1. Chronic diastolic heart failure (HCC)   2. Dissection of aorta, unspecified portion of aorta (HCC)   3. Resistant hypertension   4. Obstructive sleep apnea (adult) (pediatric)   5. Persistent atrial fibrillation (HCC)        PLAN:    Chronic diastolic heart failure: Echo 08/11/2023 showed EF 60 to 65%, severe LVH, grade 2 diastolic dysfunction, normal RV function, moderate biatrial enlargement.  Admission 08/2023 with acute on chronic diastolic heart failure.  Responded to diuresis, dry weight felt to be 244 pounds. -Continue torsemide 40 mg daily.  Check BMET, magnesium  Aortic dissection: Type A aortic  arch dissection status post repair in 2016 with dissection at anastomosis suture lines in 07/2018 status post left common and external iliac artery and left renal vein stents on 09/19/2018 and then total arch replacement, frozen elephant trunk, head vessel reconstruction on 10/03/2018 in [place].  He was admitted in June 2022 to [facility] with type B aortic dissection and hypertensive crisis.  On 02/06/2021 he underwent TEVAR extension and left renal artery stenting. -Follows with [facility] vascular surgery -Goal SBP less than 120, heart rate less than 60.     Persistent atrial fibrillation: At clinic visit on 01/05/2020, he was found to be in atrial fibrillation.  Underwent successful DCCV on 02/11/2020, but subsequently back in AF.  CHADS-VASc score 2 given HTN, vascular disease. TTE shows normal LVEF.  Currently in sinus rhythm. -Continue Eliquis 5 mg twice daily -Continue labetalol   Resistant hypertension: Currently on spironolactone 50 mg daily, hydralazine 50 mg 3 times daily, diltiazem 240 mg daily, Imdur 30 mg daily, torsemide 40 mg daily, labetalol 400 mg twice daily. -Given resistant hypertension and also issues with persistent hypokalemia, concern for hyperaldosteronism.  Renin/aldosterone ratio >60, have referred to endocrinology and started on spironolactone.  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  Will update sleep study -BP elevated in clinic today but reports has been under good control at home.  Asked him to check BP twice a day for the next week and let us know results   OSA: positive sleep study 11/2021, but was not  started on CPAP.  He is agreeable to proceeding with CPAP.  Will update sleep study.  STOP-BANG 6  Hyperlipidemia: on atorvastatin 80 mg daily.     RTC  in 4 months  Medication Adjustments/Labs and Tests Ordered: Current medicines are reviewed at length with the patient today.  Concerns regarding medicines are outlined above.  Orders Placed This Encounter  Procedures   EKG 12-Lead    No orders of the defined types were placed in this encounter.    There are no Patient Instructions on file for this visit.   Signed, Little Ishikawa, MD  09/24/2023 1:50 PM    [facility]

## 2023-09-24 NOTE — Patient Instructions (Signed)
Medication Instructions:  Continue current medications *If you need a refill on your cardiac medications before your next appointment, please call your pharmacy*   Lab Work: Bmet, mg today If you have labs (blood work) drawn today and your tests are completely normal, you will receive your results only by: MyChart Message (if you have MyChart) OR A paper copy in the mail If you have any lab test that is abnormal or we need to change your treatment, we will call you to review the results.   Testing/Procedures: Connor [name]?  Is a FDA cleared portable home sleep study test that uses a watch and 3 points of contact to monitor 7 different channels, including your heart rate, oxygen saturations, body position, snoring, and chest motion.  The study is easy to use from the comfort of your own home and accurately detect sleep apnea.  Before bed, you attach the chest sensor, attached the sleep apnea bracelet to your nondominant hand, and attach the finger probe.  After the study, the raw data is downloaded from the watch and scored for apnea events.   For more information: [email or web address]  Patient Testing Instructions:  Do not put battery into the device until bedtime when you are ready to begin the test. Please call the support number if you need assistance after following the instructions below: 24 hour support line- [phone] or [facility] support at [phone] (option 2)  Download the IntelWatchPAT One" app through the google play store or App Store  Be sure to turn on or enable access to bluetooth in settlings on your smartphone/ device  Make sure no other bluetooth devices are on and within the vicinity of your smartphone/ device and WatchPAT watch during testing.  Make sure to leave your smart phone/ device plugged in and charging all night.  When ready for bed:  Follow the instructions step by step in the WatchPAT One App to activate the testing device. For  additional instructions, including video instruction, visit the WatchPAT One video on Youtube. You can search for WatchPat One within Youtube (video is 4 minutes and 18 seconds) or enter: https://youtube/watch?v=BCce_vbiwxE Please note: You will be prompted to enter a Pin to connect via bluetooth when starting the test. The PIN will be assigned to you when you receive the test.  The device is disposable, but it recommended that you retain the device until you receive a call letting you know the study has been received and the results have been interpreted.  We will let you know if the study did not transmit to [place] properly after the test is completed. You do not need to call us to confirm the receipt of the test.  Please complete the test within 48 hours of receiving PIN.   Frequently Asked Questions:  What is Watch Dennie Bible one?  A single use fully disposable home sleep apnea testing device and will not need to be returned after completion.  What are the requirements to use WatchPAT one?  The be able to have a successful watchpat one sleep study, you should have your Watch pat one device, your smart phone, watch pat one app, your PIN number and Internet access What type of phone do I need?  You should have a smart phone that uses Android 5.1 and above or any Iphone with IOS 10 and above How can I download the WatchPAT one app?  Based on your device type search for WatchPAT one app either in google play for android devices or APP  store for Iphone's Where will I get my PIN for the study?  Your PIN will be provided by your physician's office. It is used for authentication and if you lose/forget your PIN, please reach out to your providers office.  I do not have Internet at home. Can I do WatchPAT one study?  WatchPAT One needs Internet connection throughout the night to be able to transmit the sleep data. You can use your home/local internet or your cellular's data package. However, it is always  recommended to use home/local Internet. It is estimated that between 20MB-30MB will be used with each study.However, the application will be looking for space in the phone to start the study.  What happens if I lose internet or bluetooth connection?  During the internet disconnection, your phone will not be able to transmit the sleep data. All the data, will be stored in your phone. As soon as the internet connection is back on, the phone will being sending the sleep data. During the bluetooth disconnection, WatchPAT one will not be able to to send the sleep data to your phone. Data will be kept in the [facility] one until two devices have bluetooth connection back on. As soon as the connection is back on, WatchPAT one will send the sleep data to the phone.  How long do I need to wear the WatchPAT one?  After you start the study, you should wear the device at least 6 hours.  How far should I keep my phone from the device?  During the night, your phone should be within 15 feet.  What happens if I leave the room for restroom or other reasons?  Leaving the room for any reason will not cause any problem. As soon as your get back to the room, both devices will reconnect and will continue to send the sleep data. Can I use my phone during the sleep study?  Yes, you can use your phone as usual during the study. But it is recommended to put your watchpat one on when you are ready to go to bed.  How will I get my study results?  A soon as you completed your study, your sleep data will be sent to the provider. They will then share the results with you when they are ready.      Follow-Up: At [facility], you and your health needs are our priority.  As part of our continuing mission to provide you with exceptional heart care, we have created designated Provider Care Teams.  These Care Teams include your primary Cardiologist (physician) and Advanced Practice Providers (APPs -  Physician Assistants  and Nurse Practitioners) who all work together to provide you with the care you need, when you need it.  We recommend signing up for the patient portal called "MyChart".  Sign up information is provided on this After Visit Summary.  MyChart is used to connect with patients for Virtual Visits (Telemedicine).  Patients are able to view lab/test results, encounter notes, upcoming appointments, etc.  Non-urgent messages can be sent to your provider as well.   To learn more about what you can do with MyChart, go to [email or web address].    Your next appointment:   4 month(s)  Provider:   Little Ishikawa, MD     Other Instructions Please check blood pressure once a day for one week and send/ call those reading to office

## 2023-09-25 LAB — BASIC METABOLIC PANEL
BUN/Creatinine Ratio: 16 (ref 10–24)
BUN: 20 mg/dL (ref 8–27)
CO2: 26 mmol/L (ref 20–29)
Calcium: 8.8 mg/dL (ref 8.6–10.2)
Chloride: 101 mmol/L (ref 96–106)
Creatinine, Ser: 1.26 mg/dL (ref 0.76–1.27)
Glucose: 81 mg/dL (ref 70–99)
Potassium: 3.9 mmol/L (ref 3.5–5.2)
Sodium: 144 mmol/L (ref 134–144)
eGFR: 63 mL/min/{1.73_m2} (ref >59)

## 2023-09-25 LAB — MAGNESIUM: Magnesium: 1.9 mg/dL (ref 1.6–2.3)

## 2023-09-27 ENCOUNTER — Telehealth: Payer: Self-pay | Admitting: *Deleted

## 2023-09-27 NOTE — Telephone Encounter (Signed)
Reached out to the patient to ask if he has new insurance because his card ran out on 09/27/23. Patient states he will bring his new card to be scanned once it comes in.

## 2023-10-03 ENCOUNTER — Other Ambulatory Visit: Payer: Self-pay

## 2023-10-03 ENCOUNTER — Encounter (HOSPITAL_COMMUNITY): Payer: Self-pay | Admitting: Physical Therapy

## 2023-10-03 ENCOUNTER — Ambulatory Visit (HOSPITAL_COMMUNITY): Payer: 59 | Attending: Family Medicine | Admitting: Physical Therapy

## 2023-10-03 DIAGNOSIS — S81801S Unspecified open wound, right lower leg, sequela: Secondary | ICD-10-CM | POA: Diagnosis present

## 2023-10-03 DIAGNOSIS — M79661 Pain in right lower leg: Secondary | ICD-10-CM | POA: Diagnosis present

## 2023-10-03 DIAGNOSIS — R601 Generalized edema: Secondary | ICD-10-CM | POA: Insufficient documentation

## 2023-10-03 NOTE — Addendum Note (Signed)
 Addended by: Adrienne Alberts on: 10/03/2023 03:33 PM   Modules accepted: Orders

## 2023-10-03 NOTE — Therapy (Signed)
 OUTPATIENT PHYSICAL THERAPY Wound EVALUATION   Patient Name: Connor [name] MRN: [id number] DOB:06-14-1956, 68 y.o., male Today's Date: 10/03/2023   PCP: Geni Clause REFERRING PROVIDER: Perri DELENA Meliton Mickey., MD  END OF SESSION:  PT End of Session - 10/03/23 1443     Visit Number 1    Number of Visits 4    Date for PT Re-Evaluation 11/02/23    Authorization Type UHC dual complete, [facility] [place]    Progress Note Due on Visit 4    PT Start Time 1353    PT Stop Time 1437    PT Time Calculation (min) 44 min    Activity Tolerance Patient tolerated treatment well    Behavior During Therapy WFL for tasks assessed/performed             Past Medical History:  Diagnosis Date   AAA (abdominal aortic aneurysm) (HCC)    History of open heart surgery    Hypertension    Seroma due to trauma [facility])    Past Surgical History:  Procedure Laterality Date   ABDOMINAL AORTIC ANEURYSM REPAIR     AMPUTATION Left 09/07/2020   Procedure: ATTEMPTED LEFT LEG DEBRIDEMENT FASCIOTOMIES, APPLY INSTILLATION WOUND VAC, ABOVE KNEE AMPUTATION;  Surgeon: Harden Jerona GAILS, MD;  Location: [facility] OR;  Service: Orthopedics;  Laterality: Left;   BUBBLE STUDY  09/05/2020   Procedure: BUBBLE STUDY;  Surgeon: Hobart Powell BRAVO, MD;  Location: [facility] ENDOSCOPY;  Service: Cardiovascular;;   CARDIOVERSION N/A 02/11/2020   Procedure: CARDIOVERSION;  Surgeon: Kate Lonni CROME, MD;  Location: [facility] ENDOSCOPY;  Service: Cardiovascular;  Laterality: N/A;   CORONARY ARTERY BYPASS GRAFT     STUMP REVISION Left 09/09/2020   Procedure: REVISION LEFT ABOVE KNEE AMPUTATION;  Surgeon: Harden Jerona GAILS, MD;  Location: [facility] OR;  Service: Orthopedics;  Laterality: Left;   TEE WITHOUT CARDIOVERSION N/A 09/05/2020   Procedure: TRANSESOPHAGEAL ECHOCARDIOGRAM (TEE);  Surgeon: Hobart Powell BRAVO, MD;  Location: [facility] ENDOSCOPY;  Service: Cardiovascular;  Laterality: N/A;   Patient Active Problem List   Diagnosis Date Noted   Acute exacerbation  of CHF (congestive heart failure) (HCC) 09/02/2023   Acute on chronic heart failure with preserved ejection fraction (HFpEF) (HCC) 08/11/2023   Chronic kidney disease, stage 3a (HCC) 08/11/2023   Class 2 obesity 08/11/2023   Acute CHF (congestive heart failure) (HCC) 08/10/2023   Acute pulmonary edema (HCC)    Acute on chronic diastolic (congestive) heart failure (HCC) 12/08/2020   Acute on chronic diastolic CHF (congestive heart failure) (HCC) 12/07/2020   Paroxysmal A-fib (HCC) 10/10/2020   Gastritis 10/10/2020   Dyspepsia 10/07/2020   CKD stage G2/A2, GFR 60-89 and albumin creatinine ratio 30-299 mg/g 10/07/2020   Atypical chest pain 10/06/2020   Acute blood loss anemia    Essential hypertension    Postoperative pain    Severe anemia 09/19/2020   History of left above knee amputation (HCC) 09/19/2020   Left above-knee amputee (HCC) 09/19/2020   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)    CKD (chronic kidney disease), stage III (HCC) 09/01/2020   Morbid obesity (HCC) 09/01/2020   Severe sepsis with acute organ dysfunction due to Streptococcus species (HCC) 08/30/2020   Cellulitis of left leg 08/30/2020   Lactic acidosis 08/30/2020   Elevated troponin 08/30/2020   Hypoalbuminemia 08/30/2020   Leukocytosis 08/30/2020   Thrombocytopenia (HCC) 08/30/2020   Hypokalemia 08/30/2020   Hyperglycemia 08/30/2020   Transaminitis 08/30/2020   Total bilirubin, elevated 08/30/2020   AKI (acute kidney injury) (  HCC) 08/30/2020   Nausea vomiting and diarrhea 08/30/2020   Hypertension 05/20/2020   Persistent atrial fibrillation (HCC)     ONSET DATE: 07/12/23  REFERRING DIAG:  Free Text Diagnosis  open wound of rt lower leg    THERAPY DIAG:  Leg wound, right, sequela  Pain in right lower leg  Generalized edema  Rationale for Evaluation and Treatment: Rehabilitation     Wound Therapy - 10/03/23 0001     Subjective PT states that he had multiple wounds but not  he only has one.  The wounds started in November when his puppy crawled into his lap    Patient and Family Stated Goals wound to heal    Date of Onset 07/11/24    Prior Treatments self care    Pain Scale 0-10    Pain Score 3    goes as high as a 6.   Pain Type Chronic pain    Pain Location Leg    Pain Orientation Right;Anterior    Pain Onset With Activity    Patients Stated Pain Goal 0    Evaluation and Treatment Procedures Explained to Patient/Family Yes    Evaluation and Treatment Procedures agreed to    Wound Properties Date First Assessed: 10/03/23 Time First Assessed: 1400 Wound Type: Skin tear Location: Leg Location Orientation: Right;Anterior Present on Admission: Yes   Dressing Type Gauze (Comment)    Dressing Changed Changed    Dressing Status Old drainage;New drainage    Dressing Change Frequency PRN    Site / Wound Assessment Red    % Wound base Red or Granulating 95%    % Wound base Yellow/Fibrinous Exudate 5%    Peri-wound Assessment Edema    Wound Length (cm) 1.4 cm    Wound Width (cm) 1.3 cm    Wound Depth (cm) 0.3 cm    Wound Volume (cm^3) 0.55 cm^3    Wound Surface Area (cm^2) 1.82 cm^2    Drainage Amount Moderate   clear lymph   Drainage Description Other (Comment)   lymph fluid   Treatment Cleansed;Debridement (Selective)    Selective Debridement (non-excisional) - Location anterior LE/wound    Selective Debridement (non-excisional) - Tools Used Forceps    Selective Debridement (non-excisional) - Tissue Removed devitalized tissue and dry skin    Wound Therapy - Clinical Statement see below    Factors Delaying/Impairing Wound Healing Immobility    Hydrotherapy Plan Debridement;Dressing change;Patient/family education    Wound Therapy - Frequency Other (comment)   1 x a week for 4 weeks   Wound Plan debridement and dressing change    Dressing  vaseline around wound 4x4, profore lite               PATIENT EDUCATION: Education details: Pt mother and  cousin had lymphedema.  Most likely pt has lymphedema which is why it is taking so long to heal.  Therapist measured pt for Compression garment and gave pt information for elastic therapies Person educated: Patient Education method: Explanation and Handouts Education comprehension: verbalized understanding   HOME EXERCISE PROGRAM: N/A   GOALS: Goals reviewed with patient? No  SHORT TERM GOALS: Target date: 10/13/23  PT pain to be no greater than a 3/6 Baseline: Goal status: INITIAL  2.  Pt  wound to have no depth  Baseline:  Goal status: INITIAL   LONG TERM GOALS: Target date: 10/27/23  Wound to be healed. Baseline:  Goal status: INITIAL  2.  Pt to have obtained knee high  compression garment to wear on Rt LE  Baseline:  Goal status: INITIAL  3.  Pt to have no pain in his Rt LE Baseline:  Goal status: INITIAL  ASSESSMENT:  CLINICAL IMPRESSION: Patient is a 68 y.o. male who was seen today for physical therapy evaluation and treatment for Right leg wound.  Evaluation demonstrates a non healing wound, LE anterior aspect is covered in dry skin, increased edema, and increased pain.  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  Mr. Thammavong will benefit from skilled PT to address these issues and create a healing environment for his LE by using compression to decrease his edema and proper dressing change. .    OBJECTIVE IMPAIRMENTS: decreased mobility, difficulty walking, increased edema, impaired perceived functional ability, and pain.   ACTIVITY LIMITATIONS: sitting, dressing, and hygiene/grooming   PERSONAL FACTORS: Fitness, Time since onset of injury/illness/exacerbation, and 1-2 comorbidities: edema (most likely lymphedema), CKD, hx of AKA  are also affecting patient's functional outcome.   REHAB POTENTIAL: Good  CLINICAL DECISION MAKING: Evolving/moderate complexity  EVALUATION COMPLEXITY: Moderate  PLAN: PT FREQUENCY: 1x/week  PT DURATION: 4  weeks  PLANNED INTERVENTIONS: 97535- Self Care, 02859- Manual therapy, and 97597- Wound care (first 20 sq cm)  PLAN FOR NEXT SESSION: assess how dressing did for LE, anticipate the compression decreases the drainage.   Montie Metro, PT CLT [phone]  10/03/2023, 2:53 PM

## 2023-10-07 ENCOUNTER — Ambulatory Visit (HOSPITAL_COMMUNITY): Payer: 59 | Admitting: Physical Therapy

## 2023-10-07 DIAGNOSIS — M79661 Pain in right lower leg: Secondary | ICD-10-CM

## 2023-10-07 DIAGNOSIS — R601 Generalized edema: Secondary | ICD-10-CM

## 2023-10-07 DIAGNOSIS — S81801S Unspecified open wound, right lower leg, sequela: Secondary | ICD-10-CM | POA: Diagnosis not present

## 2023-10-07 NOTE — Therapy (Signed)
 OUTPATIENT PHYSICAL THERAPY Wound TREATMENT   Patient Name: Connor [name] MRN: [id number] DOB:06/02/1956, 68 y.o., male Today's Date: 10/07/2023   PCP: Torrence Freeze REFERRING PROVIDER: Etter Hermann., MD  END OF SESSION:  PT End of Session - 10/07/23 1014     Visit Number 2    Number of Visits 4    Date for PT Re-Evaluation 11/02/23    Authorization Type UHC dual complete, [facility] [place]    Progress Note Due on Visit 4    PT Start Time 0945    PT Stop Time 1008    PT Time Calculation (min) 23 min    Activity Tolerance Patient tolerated treatment well    Behavior During Therapy WFL for tasks assessed/performed             Past Medical History:  Diagnosis Date   AAA (abdominal aortic aneurysm) (HCC)    History of open heart surgery    Hypertension    Seroma due to trauma [facility])    Past Surgical History:  Procedure Laterality Date   ABDOMINAL AORTIC ANEURYSM REPAIR     AMPUTATION Left 09/07/2020   Procedure: ATTEMPTED LEFT LEG DEBRIDEMENT FASCIOTOMIES, APPLY INSTILLATION WOUND VAC, ABOVE KNEE AMPUTATION;  Surgeon: Timothy Ford, MD;  Location: [facility] OR;  Service: Orthopedics;  Laterality: Left;   BUBBLE STUDY  09/05/2020   Procedure: BUBBLE STUDY;  Surgeon: Sonny Dust, MD;  Location: [facility] ENDOSCOPY;  Service: Cardiovascular;;   CARDIOVERSION N/A 02/11/2020   Procedure: CARDIOVERSION;  Surgeon: Wendie Hamburg, MD;  Location: [facility] ENDOSCOPY;  Service: Cardiovascular;  Laterality: N/A;   CORONARY ARTERY BYPASS GRAFT     STUMP REVISION Left 09/09/2020   Procedure: REVISION LEFT ABOVE KNEE AMPUTATION;  Surgeon: Timothy Ford, MD;  Location: [facility] OR;  Service: Orthopedics;  Laterality: Left;   TEE WITHOUT CARDIOVERSION N/A 09/05/2020   Procedure: TRANSESOPHAGEAL ECHOCARDIOGRAM (TEE);  Surgeon: Sonny Dust, MD;  Location: [facility] ENDOSCOPY;  Service: Cardiovascular;  Laterality: N/A;   Patient Active Problem List   Diagnosis Date Noted   Acute exacerbation  of CHF (congestive heart failure) (HCC) 09/02/2023   Acute on chronic heart failure with preserved ejection fraction (HFpEF) (HCC) 08/11/2023   Chronic kidney disease, stage 3a (HCC) 08/11/2023   Class 2 obesity 08/11/2023   Acute CHF (congestive heart failure) (HCC) 08/10/2023   Acute pulmonary edema (HCC)    Acute on chronic diastolic (congestive) heart failure (HCC) 12/08/2020   Acute on chronic diastolic CHF (congestive heart failure) (HCC) 12/07/2020   Paroxysmal A-fib (HCC) 10/10/2020   Gastritis 10/10/2020   Dyspepsia 10/07/2020   CKD stage G2/A2, GFR 60-89 and albumin creatinine ratio 30-299 mg/g 10/07/2020   Atypical chest pain 10/06/2020   Acute blood loss anemia    Essential hypertension    Postoperative pain    Severe anemia 09/19/2020   History of left above knee amputation (HCC) 09/19/2020   Left above-knee amputee (HCC) 09/19/2020   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)    CKD (chronic kidney disease), stage III (HCC) 09/01/2020   Morbid obesity (HCC) 09/01/2020   Severe sepsis with acute organ dysfunction due to Streptococcus species (HCC) 08/30/2020   Cellulitis of left leg 08/30/2020   Lactic acidosis 08/30/2020   Elevated troponin 08/30/2020   Hypoalbuminemia 08/30/2020   Leukocytosis 08/30/2020   Thrombocytopenia (HCC) 08/30/2020   Hypokalemia 08/30/2020   Hyperglycemia 08/30/2020   Transaminitis 08/30/2020   Total bilirubin, elevated 08/30/2020   AKI (acute kidney injury) (  HCC) 08/30/2020   Nausea vomiting and diarrhea 08/30/2020   Hypertension 05/20/2020   Persistent atrial fibrillation (HCC)     ONSET DATE: 07/12/23  REFERRING DIAG:  Free Text Diagnosis  open wound of rt lower leg    THERAPY DIAG:  Leg wound, right, sequela  Pain in right lower leg  Generalized edema  Rationale for Evaluation and Treatment: Rehabilitation     Wound Therapy - 10/07/23 1015     Subjective pt states no issues or pain    Patient and  Family Stated Goals wound to heal    Date of Onset 07/11/24    Prior Treatments self care    Pain Scale 0-10    Pain Score 0-No pain    Evaluation and Treatment Procedures Explained to Patient/Family Yes    Evaluation and Treatment Procedures agreed to    Wound Properties Date First Assessed: 10/03/23 Time First Assessed: 1400 Wound Type: Skin tear Location: Leg Location Orientation: Right;Anterior Present on Admission: Yes   Dressing Type Gauze (Comment);Compression wrap    Dressing Changed Changed    Dressing Status Old drainage;New drainage    Dressing Change Frequency PRN    Site / Wound Assessment Red    % Wound base Red or Granulating 100%   following debridement 100%   Peri-wound Assessment Intact    Wound Length (cm) 1.2 cm    Wound Width (cm) 0.5 cm    Wound Depth (cm) 0.1 cm    Wound Volume (cm^3) 0.06 cm^3    Wound Surface Area (cm^2) 0.6 cm^2    Drainage Amount Scant    Treatment Cleansed;Debridement (Selective)    Selective Debridement (non-excisional) - Location anterior LE/wound    Selective Debridement (non-excisional) - Tools Used Forceps    Selective Debridement (non-excisional) - Tissue Removed devitalized tissue and dry skin    Wound Therapy - Clinical Statement see below    Factors Delaying/Impairing Wound Healing Immobility    Hydrotherapy Plan Debridement;Dressing change;Patient/family education    Wound Therapy - Frequency Other (comment)   1 x a week for 4 weeks   Wound Plan debridement and dressing change    Dressing  vaseline around wound 4x4, profore lite                PATIENT EDUCATION: Education details: Pt mother and cousin had lymphedema.  Most likely pt has lymphedema which is why it is taking so long to heal.  Therapist measured pt for Compression garment and gave pt information for elastic therapies Person educated: Patient Education method: Explanation and Handouts Education comprehension: verbalized understanding   HOME EXERCISE  PROGRAM: N/A   GOALS: Goals reviewed with patient? No  SHORT TERM GOALS: Target date: 10/13/23  PT pain to be no greater than a 3/6 Baseline: Goal status: INITIAL  2.  Pt  wound to have no depth  Baseline:  Goal status: INITIAL   LONG TERM GOALS: Target date: 10/27/23  Wound to be healed. Baseline:  Goal status: INITIAL  2.  Pt to have obtained knee high compression garment to wear on Rt LE  Baseline:  Goal status: INITIAL  3.  Pt to have no pain in his Rt LE Baseline:  Goal status: INITIAL  ASSESSMENT:  CLINICAL IMPRESSION: Patient returns today with dressing intact, overall doing well. Dry tissue, devitalized tissue removed perimeter with noted approximation and remaining 100% granulated.  Wound re measured with vast reduction today. Anticipate full closure by next week.  Discussed with patient beginning gait  with prosthesis and strength training once wound is healed if interested.  Informed he would need a new order from his MD.   Mr. Bordonaro will benefit from skilled PT to fully heal his wound.     OBJECTIVE IMPAIRMENTS: decreased mobility, difficulty walking, increased edema, impaired perceived functional ability, and pain.   ACTIVITY LIMITATIONS: sitting, dressing, and hygiene/grooming   PERSONAL FACTORS: Fitness, Time since onset of injury/illness/exacerbation, and 1-2 comorbidities: edema (most likely lymphedema), CKD, hx of AKA  are also affecting patient's functional outcome.   REHAB POTENTIAL: Good  CLINICAL DECISION MAKING: Evolving/moderate complexity  EVALUATION COMPLEXITY: Moderate  PLAN: PT FREQUENCY: 1x/week  PT DURATION: 4 weeks  PLANNED INTERVENTIONS: 97535- Self Care, 40981- Manual therapy, and 97597- Wound care (first 20 sq cm)  PLAN FOR NEXT SESSION: continue with appropriate wound care/dressings.   Lorenso Romance, PTA/CLT [facility] Ph: [phone]   10/07/2023, 10:19 AM

## 2023-10-09 ENCOUNTER — Telehealth: Payer: Self-pay | Admitting: Cardiology

## 2023-10-09 DIAGNOSIS — I10 Essential (primary) hypertension: Secondary | ICD-10-CM

## 2023-10-09 NOTE — Telephone Encounter (Signed)
Patient called in with his bp  2/1 am: 132/75 HR 82 weight 239       Pm: 14086 HR 77 weight 238  2/2 am: 167/77 HR 86 weight 237       pm: 144/54 HR 76 weight 238  2/3 am: 166/73 HR 80 weight 240       pm: 179/69 HR 79 weight 241  2/4 am: 168/71 HR 82 weight 241       pm: 177/64 HR 79 weight 241  2/5 am: 152/57 HR 88 weight 242       pm: 177/82 HR 91 weight 241  2/6 am: 174/63 HR 82 weight 242       pm: 165/66 HR 80 weight 241  2/7 am: 138/48 HR 77 weight 241       pm: 169/63 HR 77 weight 242

## 2023-10-10 ENCOUNTER — Ambulatory Visit (HOSPITAL_COMMUNITY): Payer: 59 | Admitting: Physical Therapy

## 2023-10-10 NOTE — Telephone Encounter (Signed)
Recommend scheduling in pharmacy hypertension clinic.  Would ask him to bring all his medicines with him so we can ensure what he is taking.  Would also ask him to bring his BP log and his home monitor to calibrate with him to appointment

## 2023-10-14 ENCOUNTER — Encounter (HOSPITAL_COMMUNITY): Payer: Self-pay

## 2023-10-14 ENCOUNTER — Ambulatory Visit (HOSPITAL_COMMUNITY): Payer: 59

## 2023-10-14 DIAGNOSIS — M79661 Pain in right lower leg: Secondary | ICD-10-CM

## 2023-10-14 DIAGNOSIS — S81801S Unspecified open wound, right lower leg, sequela: Secondary | ICD-10-CM

## 2023-10-14 DIAGNOSIS — R601 Generalized edema: Secondary | ICD-10-CM

## 2023-10-14 NOTE — Therapy (Signed)
 OUTPATIENT PHYSICAL THERAPY Wound TREATMENT   Patient Name: Connor [name] MRN: [id number] DOB:[birth year], 68 y.o., male Today's Date: 10/14/2023   PCP: Erskine Speed REFERRING PROVIDER: Zigmund Daniel., MD  END OF SESSION:  PT End of Session - 10/14/23 1007     Visit Number 3    Number of Visits 4    Date for PT Re-Evaluation 11/02/23    Authorization Type UHC dual complete, [facility] [place]    Progress Note Due on Visit 4    PT Start Time [phone]    PT Stop Time 1004    PT Time Calculation (min) 30 min    Activity Tolerance Patient tolerated treatment well    Behavior During Therapy WFL for tasks assessed/performed             Past Medical History:  Diagnosis Date   AAA (abdominal aortic aneurysm) (HCC)    History of open heart surgery    Hypertension    Seroma due to trauma [facility])    Past Surgical History:  Procedure Laterality Date   ABDOMINAL AORTIC ANEURYSM REPAIR     AMPUTATION Left 09/07/2020   Procedure: ATTEMPTED LEFT LEG DEBRIDEMENT FASCIOTOMIES, APPLY INSTILLATION WOUND VAC, ABOVE KNEE AMPUTATION;  Surgeon: Nadara Mustard, MD;  Location: [facility] OR;  Service: Orthopedics;  Laterality: Left;   BUBBLE STUDY  09/05/2020   Procedure: BUBBLE STUDY;  Surgeon: Meriam Sprague, MD;  Location: [facility] ENDOSCOPY;  Service: Cardiovascular;;   CARDIOVERSION N/A 02/11/2020   Procedure: CARDIOVERSION;  Surgeon: Little Ishikawa, MD;  Location: [facility] ENDOSCOPY;  Service: Cardiovascular;  Laterality: N/A;   CORONARY ARTERY BYPASS GRAFT     STUMP REVISION Left 09/09/2020   Procedure: REVISION LEFT ABOVE KNEE AMPUTATION;  Surgeon: Nadara Mustard, MD;  Location: [facility] OR;  Service: Orthopedics;  Laterality: Left;   TEE WITHOUT CARDIOVERSION N/A 09/05/2020   Procedure: TRANSESOPHAGEAL ECHOCARDIOGRAM (TEE);  Surgeon: Meriam Sprague, MD;  Location: [facility] ENDOSCOPY;  Service: Cardiovascular;  Laterality: N/A;   Patient Active Problem List   Diagnosis Date Noted   Acute exacerbation  of CHF (congestive heart failure) (HCC) 09/02/2023   Acute on chronic heart failure with preserved ejection fraction (HFpEF) (HCC) 08/11/2023   Chronic kidney disease, stage 3a (HCC) 08/11/2023   Class 2 obesity 08/11/2023   Acute CHF (congestive heart failure) (HCC) 08/10/2023   Acute pulmonary edema (HCC)    Acute on chronic diastolic (congestive) heart failure (HCC) 12/08/2020   Acute on chronic diastolic CHF (congestive heart failure) (HCC) 12/07/2020   Paroxysmal A-fib (HCC) 10/10/2020   Gastritis 10/10/2020   Dyspepsia 10/07/2020   CKD stage G2/A2, GFR 60-89 and albumin creatinine ratio 30-299 mg/g 10/07/2020   Atypical chest pain 10/06/2020   Acute blood loss anemia    Essential hypertension    Postoperative pain    Severe anemia 09/19/2020   History of left above knee amputation (HCC) 09/19/2020   Left above-knee amputee (HCC) 09/19/2020   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)    CKD (chronic kidney disease), stage III (HCC) 09/01/2020   Morbid obesity (HCC) 09/01/2020   Severe sepsis with acute organ dysfunction due to Streptococcus species (HCC) 08/30/2020   Cellulitis of left leg 08/30/2020   Lactic acidosis 08/30/2020   Elevated troponin 08/30/2020   Hypoalbuminemia 08/30/2020   Leukocytosis 08/30/2020   Thrombocytopenia (HCC) 08/30/2020   Hypokalemia 08/30/2020   Hyperglycemia 08/30/2020   Transaminitis 08/30/2020   Total bilirubin, elevated 08/30/2020   AKI (acute kidney injury) (  HCC) 08/30/2020   Nausea vomiting and diarrhea 08/30/2020   Hypertension 05/20/2020   Persistent atrial fibrillation (HCC)     ONSET DATE: 07/12/23  REFERRING DIAG:  Free Text Diagnosis  open wound of rt lower leg    THERAPY DIAG:  Leg wound, right, sequela  Pain in right lower leg  Generalized edema  Rationale for Evaluation and Treatment: Rehabilitation     Wound Therapy - 10/14/23 0001     Subjective Pt arrived with dressings intact, no reports of  pain today.    Patient and Family Stated Goals wound to heal    Date of Onset 07/11/24    Prior Treatments self care    Pain Scale 0-10    Pain Score 0-No pain    Evaluation and Treatment Procedures Explained to Patient/Family Yes    Evaluation and Treatment Procedures agreed to    Wound Properties Date First Assessed: 10/03/23 Time First Assessed: 1400 Wound Type: Skin tear Location: Leg Location Orientation: Right;Anterior Present on Admission: Yes   Wound Image Images linked: 1    Dressing Type Impregnated gauze (bismuth);Gauze (Comment);Compression wrap    Dressing Changed Changed    Dressing Status Old drainage;New drainage    Dressing Change Frequency PRN    Site / Wound Assessment Red    % Wound base Red or Granulating 100%   following debridement   Peri-wound Assessment Intact    Wound Length (cm) 0.9 cm    Wound Width (cm) 0.5 cm    Wound Surface Area (cm^2) 0.45 cm^2    Drainage Amount Scant    Treatment Cleansed;Debridement (Selective)    Selective Debridement (non-excisional) - Location anterior LE/wound    Selective Debridement (non-excisional) - Tools Used Forceps    Selective Debridement (non-excisional) - Tissue Removed devitalized tissue and dry skin    Wound Therapy - Clinical Statement see below    Factors Delaying/Impairing Wound Healing Immobility    Hydrotherapy Plan Debridement;Dressing change;Patient/family education    Wound Therapy - Frequency Other (comment)   1x/ week for 4 weeks   Wound Plan debridement and dressing change    Dressing  vaseline around wound 4x4, profore lite                PATIENT EDUCATION: Education details: Pt mother and cousin had lymphedema.  Most likely pt has lymphedema which is why it is taking so long to heal.  Therapist measured pt for Compression garment and gave pt information for elastic therapies Person educated: Patient Education method: Explanation and Handouts Education comprehension: verbalized  understanding   HOME EXERCISE PROGRAM: N/A   GOALS: Goals reviewed with patient? No  SHORT TERM GOALS: Target date: 10/13/23  PT pain to be no greater than a 3/6 Baseline: Goal status: INITIAL  2.  Pt  wound to have no depth  Baseline:  Goal status: INITIAL   LONG TERM GOALS: Target date: 10/27/23  Wound to be healed. Baseline:  Goal status: INITIAL  2.  Pt to have obtained knee high compression garment to wear on Rt LE  Baseline:  Goal status: INITIAL  3.  Pt to have no pain in his Rt LE Baseline:  Goal status: INITIAL  ASSESSMENT:  CLINICAL IMPRESSION: Wound is approximating nicely and improved granulation tissue.  Selective debridement for removal of slough from wound bed and dry skin perimeter to promote healing.  Ancipitate full closure next apt.  Continued with xeroform and profore lite for edema control.  Educated and strongly encouraged pt  to purchase compression garment and make apt with podiatrist to address long toe nails with verbalized understanding.     OBJECTIVE IMPAIRMENTS: decreased mobility, difficulty walking, increased edema, impaired perceived functional ability, and pain.   ACTIVITY LIMITATIONS: sitting, dressing, and hygiene/grooming   PERSONAL FACTORS: Fitness, Time since onset of injury/illness/exacerbation, and 1-2 comorbidities: edema (most likely lymphedema), CKD, hx of AKA  are also affecting patient's functional outcome.   REHAB POTENTIAL: Good  CLINICAL DECISION MAKING: Evolving/moderate complexity  EVALUATION COMPLEXITY: Moderate  PLAN: PT FREQUENCY: 1x/week  PT DURATION: 4 weeks  PLANNED INTERVENTIONS: 97535- Self Care, 16109- Manual therapy, and 97597- Wound care (first 20 sq cm)  PLAN FOR NEXT SESSION: continue with appropriate wound care/dressings.   Becky Sax, LPTA/CLT; Rowe Clack [phone]  10/14/2023, 10:09 AM

## 2023-10-15 NOTE — Telephone Encounter (Signed)
 Called and made patient aware that per Dr.Schumann a referral to Pharm D for Hypertension has been ordered.  Made patient aware that someone will call to schedule appointment with pharmacy. Made patient aware to bring blood pressure reading, medications and bring blood pressure monitor to be calibrated to that visit too. Patient verbalized an understanding.

## 2023-10-16 ENCOUNTER — Ambulatory Visit (HOSPITAL_BASED_OUTPATIENT_CLINIC_OR_DEPARTMENT_OTHER): Payer: 59 | Admitting: Internal Medicine

## 2023-10-17 ENCOUNTER — Ambulatory Visit (HOSPITAL_COMMUNITY): Payer: 59 | Admitting: Physical Therapy

## 2023-10-21 ENCOUNTER — Ambulatory Visit (HOSPITAL_COMMUNITY): Payer: 59 | Admitting: Physical Therapy

## 2023-10-21 DIAGNOSIS — S81801S Unspecified open wound, right lower leg, sequela: Secondary | ICD-10-CM | POA: Diagnosis not present

## 2023-10-21 DIAGNOSIS — M79661 Pain in right lower leg: Secondary | ICD-10-CM

## 2023-10-21 DIAGNOSIS — R601 Generalized edema: Secondary | ICD-10-CM

## 2023-10-21 NOTE — Therapy (Signed)
 OUTPATIENT PHYSICAL THERAPY Wound TREATMENT/Discharge    Patient Name: Connor [name] MRN: [id number] DOB:Mar 18, 1956, 68 y.o., male Today's Date: 10/21/2023 PHYSICAL THERAPY DISCHARGE SUMMARY  Visits from Start of Care: 4  Current functional level related to goals / functional outcomes: See below    Remaining deficits: Small open area   Education / Equipment: The importance of moisturizing as well as self manual and exercises.    Patient agrees to discharge. Patient goals were partially met. Patient is being discharged due to being pleased with the current functional level.   PCP: Erskine Speed REFERRING PROVIDER: Zigmund Daniel., MD  END OF SESSION:  PT End of Session - 10/21/23 1212     Visit Number 4    Number of Visits 4    Date for PT Re-Evaluation 11/02/23    Authorization Type UHC dual complete, [facility] [place]    Progress Note Due on Visit 4    PT Start Time 1149    PT Stop Time 1213    PT Time Calculation (min) 24 min    Activity Tolerance Patient tolerated treatment well    Behavior During Therapy WFL for tasks assessed/performed             Past Medical History:  Diagnosis Date   AAA (abdominal aortic aneurysm) (HCC)    History of open heart surgery    Hypertension    Seroma due to trauma [facility])    Past Surgical History:  Procedure Laterality Date   ABDOMINAL AORTIC ANEURYSM REPAIR     AMPUTATION Left 09/07/2020   Procedure: ATTEMPTED LEFT LEG DEBRIDEMENT FASCIOTOMIES, APPLY INSTILLATION WOUND VAC, ABOVE KNEE AMPUTATION;  Surgeon: Nadara Mustard, MD;  Location: [facility] OR;  Service: Orthopedics;  Laterality: Left;   BUBBLE STUDY  09/05/2020   Procedure: BUBBLE STUDY;  Surgeon: Meriam Sprague, MD;  Location: [facility] ENDOSCOPY;  Service: Cardiovascular;;   CARDIOVERSION N/A 02/11/2020   Procedure: CARDIOVERSION;  Surgeon: Little Ishikawa, MD;  Location: [facility] ENDOSCOPY;  Service: Cardiovascular;  Laterality: N/A;   CORONARY ARTERY BYPASS GRAFT      STUMP REVISION Left 09/09/2020   Procedure: REVISION LEFT ABOVE KNEE AMPUTATION;  Surgeon: Nadara Mustard, MD;  Location: [facility] OR;  Service: Orthopedics;  Laterality: Left;   TEE WITHOUT CARDIOVERSION N/A 09/05/2020   Procedure: TRANSESOPHAGEAL ECHOCARDIOGRAM (TEE);  Surgeon: Meriam Sprague, MD;  Location: [facility] ENDOSCOPY;  Service: Cardiovascular;  Laterality: N/A;   Patient Active Problem List   Diagnosis Date Noted   Acute exacerbation of CHF (congestive heart failure) (HCC) 09/02/2023   Acute on chronic heart failure with preserved ejection fraction (HFpEF) (HCC) 08/11/2023   Chronic kidney disease, stage 3a (HCC) 08/11/2023   Class 2 obesity 08/11/2023   Acute CHF (congestive heart failure) (HCC) 08/10/2023   Acute pulmonary edema (HCC)    Acute on chronic diastolic (congestive) heart failure (HCC) 12/08/2020   Acute on chronic diastolic CHF (congestive heart failure) (HCC) 12/07/2020   Paroxysmal A-fib (HCC) 10/10/2020   Gastritis 10/10/2020   Dyspepsia 10/07/2020   CKD stage G2/A2, GFR 60-89 and albumin creatinine ratio 30-299 mg/g 10/07/2020   Atypical chest pain 10/06/2020   Acute blood loss anemia    Essential hypertension    Postoperative pain    Severe anemia 09/19/2020   History of left above knee amputation (HCC) 09/19/2020   Left above-knee amputee (HCC) 09/19/2020   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)    CKD (chronic kidney disease), stage III (HCC) 09/01/2020  Morbid obesity (HCC) 09/01/2020   Severe sepsis with acute organ dysfunction due to Streptococcus species (HCC) 08/30/2020   Cellulitis of left leg 08/30/2020   Lactic acidosis 08/30/2020   Elevated troponin 08/30/2020   Hypoalbuminemia 08/30/2020   Leukocytosis 08/30/2020   Thrombocytopenia (HCC) 08/30/2020   Hypokalemia 08/30/2020   Hyperglycemia 08/30/2020   Transaminitis 08/30/2020   Total bilirubin, elevated 08/30/2020   AKI (acute kidney injury) (HCC) 08/30/2020   Nausea  vomiting and diarrhea 08/30/2020   Hypertension 05/20/2020   Persistent atrial fibrillation (HCC)     ONSET DATE: 07/12/23  REFERRING DIAG:  Free Text Diagnosis  open wound of rt lower leg    THERAPY DIAG:  Leg wound, right, sequela  Pain in right lower leg  Generalized edema  Rationale for Evaluation and Treatment: Rehabilitation     Wound Therapy - 10/21/23 0001     Subjective Pt arrived with dressings intact, no reports of pain today.    Patient and Family Stated Goals wound to heal    Date of Onset 07/11/24    Prior Treatments self care    Pain Scale 0-10    Pain Score 0-No pain    Evaluation and Treatment Procedures Explained to Patient/Family Yes    Evaluation and Treatment Procedures agreed to    Wound Properties Date First Assessed: 10/03/23 Time First Assessed: 1400 Wound Type: Skin tear Location: Leg Location Orientation: Right;Anterior Present on Admission: Yes   Dressing Type Impregnated gauze (bismuth);Gauze (Comment);Compression wrap    Dressing Changed Changed    Dressing Status Old drainage;New drainage    % Wound base Red or Granulating 100%    Peri-wound Assessment Intact    Wound Length (cm) 0.3 cm    Wound Width (cm) 0.1 cm    Wound Surface Area (cm^2) 0.03 cm^2    Drainage Amount Scant    Treatment --   manual to decrease edema; cleansed moisurized.   Wound Therapy - Clinical Statement see below    Factors Delaying/Impairing Wound Healing Immobility    Hydrotherapy Plan Debridement;Dressing change;Patient/family education    Wound Therapy - Frequency Other (comment)   1x/ week for 4 weeks   Wound Plan debridement and dressing change    Dressing  bandaid                PATIENT EDUCATION: Education details: Pt mother and cousin had lymphedema.  Most likely pt has lymphedema which is why it is taking so long to heal.  Therapist measured pt for Compression garment and gave pt information for elastic therapies Person educated:  Patient Education method: Explanation and Handouts Education comprehension: verbalized understanding   HOME EXERCISE PROGRAM: N/A   GOALS: Goals reviewed with patient? No  SHORT TERM GOALS: Target date: 10/13/23  PT pain to be no greater than a 3/6 Baseline: Goal status: MET  2.  Pt  wound to have no depth  Baseline:  Goal status: MET   LONG TERM GOALS: Target date: 10/27/23  Wound to be healed. Baseline:  Goal status: IN PROGRESS  2.  Pt to have obtained knee high compression garment to wear on Rt LE  Baseline:  Goal status: IN PROGRESS  3.  Pt to have no pain in his Rt LE Baseline:  Goal status: MET  ASSESSMENT:  CLINICAL IMPRESSION: Pt has not ordered his compression stocking yet.  Pt has noted dry flaking skin.  Therapist educated pt on the importance of moisturizing LE, pt verbalized understanding.  Pt LE with  increased edema today, therapist completed manual to decrease edema.  Therapist then educated pt on how to complete self manual and instructed to complete 2x /day or more to assist in decreasing edema since he has not ordered a compression garment at this time.  There is a very small opening at this time but anticipate this to be healed in a few days.  Therapist gave pt the option of a compression bandaging system but pt wants to be able to shower.   Pt demonstrated self manual techniques to therapist.   OBJECTIVE IMPAIRMENTS: decreased mobility, difficulty walking, increased edema, impaired perceived functional ability, and pain.   ACTIVITY LIMITATIONS: sitting, dressing, and hygiene/grooming   PERSONAL FACTORS: Fitness, Time since onset of injury/illness/exacerbation, and 1-2 comorbidities: edema (most likely lymphedema), CKD, hx of AKA  are also affecting patient's functional outcome.   REHAB POTENTIAL: Good  CLINICAL DECISION MAKING: Evolving/moderate complexity  EVALUATION COMPLEXITY: Moderate  PLAN: PT FREQUENCY: 1x/week  PT DURATION: 4  weeks  PLANNED INTERVENTIONS: 97535- Self Care, 54098- Manual therapy, and 97597- Wound care (first 20 sq cm)  PLAN FOR NEXT SESSION: Discharge to self care.  Donnamae Jude PT/CLT [phone]  10/21/2023, 12:15 PM

## 2023-10-24 ENCOUNTER — Ambulatory Visit (HOSPITAL_COMMUNITY): Payer: 59 | Admitting: Physical Therapy

## 2023-10-28 ENCOUNTER — Ambulatory Visit (HOSPITAL_COMMUNITY): Payer: 59 | Admitting: Physical Therapy

## 2023-10-31 ENCOUNTER — Ambulatory Visit (HOSPITAL_COMMUNITY): Payer: 59 | Admitting: Physical Therapy

## 2023-11-01 ENCOUNTER — Ambulatory Visit: Payer: 59 | Attending: Internal Medicine | Admitting: Pharmacist Clinician (PhC)/ Clinical Pharmacy Specialist

## 2023-11-01 ENCOUNTER — Encounter: Payer: Self-pay | Admitting: Pharmacist Clinician (PhC)/ Clinical Pharmacy Specialist

## 2023-11-01 VITALS — BP 147/93 | HR 106

## 2023-11-01 DIAGNOSIS — I10 Essential (primary) hypertension: Secondary | ICD-10-CM

## 2023-11-01 NOTE — Progress Notes (Signed)
 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 male patient of Dr Bjorn Pippin, in the office today for hypertension evaluation.   He has an extensive cardiac history, including aortic dissection.  He states that he was diagnosed with hypertension many years ago, but didn't like the idea of taking medications for life, so he quit taking them.  He has been more compliant in the past couple of years, since his first surgery for the dissection.   He has taken multiple medications for his blood pressure, and at his last hospitalization stopped diltiazem and lisinopril.  Today he states that home readings are always WNL (see below)  Blood Pressure Goal:  120/80 (due to extensive aortic repair)   Heart rate goal = 60  Current Medications:  spironolactone 50 mg every day, hydralazine 50 mg bid, isosorbide mono 30 mg every day, torsemide 40 mg every day, labetalol 400 mg twice daily  Previously tried:  all d/c secondary to BP better controlled  Lisinopril   Nifedipine xl 90  Valsartan 80   Diltiazem XR 240  Family Hx:   mother had heart failure, at 88; 2 siblings without issues; children 31, 42, 26, none with hypertension.   Social Hx:      Tobacco: quit about 4 months ago  Alcohol: no  Caffeine: no  Diet:    lots of fruits and nuts, beet juice, more home cooked meals, only eats out occasionally; chicken and fish for protein; lots of vegetables, mostly fresh  Exercise: PT until changed insurance, now not covered,  just resumed, hasn't ben back  Home BP readings:  home device 52-88 years old, per patient range is 132-116/71-87    Accessory Clinical Findings    Lab Results  Component Value Date   CREATININE 1.26 09/24/2023   BUN 20 09/24/2023   NA 144 09/24/2023   K 3.9 09/24/2023   CL 101 09/24/2023   CO2 26 09/24/2023   Lab Results  Component Value Date   ALT 12 09/10/2023   AST 17 09/10/2023   ALKPHOS 74 09/10/2023   BILITOT 0.8 09/10/2023   Lab Results  Component Value Date   HGBA1C 4.6 (L) 09/02/2020    Home Medications    Current Outpatient Medications  Medication Sig Dispense Refill   aspirin EC 81 MG tablet Take 81 mg by mouth daily. Swallow whole.     atorvastatin (LIPITOR) 80 MG tablet Take 1 tablet (80 mg total) by mouth daily. 30 tablet 1   ELIQUIS 5 MG TABS tablet TAKE 1 TABLET (5 MG TOTAL) BY MOUTH 2 (TWO) TIMES DAILY. 180 tablet 3   gabapentin (NEURONTIN) 400 MG capsule TAKE 1 CAPSULE (400 MG TOTAL) BY MOUTH THREE TIMES DAILY. 90 capsule 0   hydrALAZINE (APRESOLINE) 50 MG tablet Take 1 tablet by mouth in the morning, at noon, and at bedtime.     isosorbide mononitrate (IMDUR) 30 MG 24 hr tablet Take 1 tablet (30 mg total) by mouth daily. 30 tablet 2  labetalol (NORMODYNE) 200 MG tablet TAKE 2 TABLETS (400 MG TOTAL) BY MOUTH TWO TIMES DAILY. 180 tablet 3   Multiple Vitamins-Minerals (MULTIVITAMIN WITH MINERALS) tablet Take 1 tablet by mouth daily. Men     spironolactone (ALDACTONE) 50 MG tablet Take 1 tablet (50 mg total) by mouth daily. 30 tablet 11   torsemide 40 MG TABS Take 40 mg by mouth 2 (two) times daily for 5 days, THEN 40 mg daily. You'll need repeat labs within a week to ensure your kidney function and electrolytes are stable.  Weigh yourself daily.  If your weight is increasing by 2-3 lbs in a day or by 5 lbs in a week take an extra dose of torsemide and call your PCP or cardiologist for additional instructions regarding your torsemide.. 40 tablet 0   traZODone  (DESYREL) 100 MG tablet Take 100 mg by mouth at bedtime.     acetaminophen (TYLENOL) 325 MG tablet Take 2 tablets (650 mg total) by mouth every 6 (six) hours as needed for moderate pain.     No current facility-administered medications for this visit.     HYPERTENSION CONTROL Vitals:   11/01/23 1340 11/01/23 1347  BP: (!) 153/108 (!) 147/93    The patient's blood pressure is elevated above target today.  In order to address the patient's elevated BP: Blood pressure will be monitored at home to determine if medication changes need to be made.      Assessment & Plan    Hypertension Assessment: BP is uncontrolled in office BP 153/108 mmHg;  above the goal (<120/80). Tolerates current medications well without any side effects Denies SOB, palpitation, chest pain,, headaches,or swelling Reiterated the importance of regular exercise and low salt diet   Plan:  Advised patient to take extra 50 mg dose of hydralazine any time BP is > 140/80 at home.  He states hasn't seen any readings as high as what we had in the office and will check home pressure today before deciding to take extra hydralazine.   Continue taking spironolactone 50 mg every day, hydralazine 50 mg bid, isosorbide mono 30 mg every day, torsemide 40 mg every day, labetalol 400 mg twice daily Patient to keep record of BP readings with heart rate and report to [place] at the next visit Patient to follow up with PharmD in 1 month  Labs ordered today:  none   Phillips Hay PharmD CPP [facility]  [address] [place], [place] [address] [phone]

## 2023-11-01 NOTE — Patient Instructions (Signed)
 Follow up appointment: Monday April 7 at 1:30 pm  Take your BP meds as follows:  Check BP when you get home,  if BP is > 140/80 please take an extra 50 mg dose of hydralazine  Continue with current medications.   BRING YOUR HOME BLOOD PRESSURE MACHINE AND READINGS TO NEXT APPOINTMENT  Check your blood pressure at home daily  and keep record of the readings.  Your blood pressure goal is < 120/80  To check your pressure at home you will need to:  1. Sit up in a chair, with feet flat on the floor and back supported. Do not cross your ankles or legs. 2. Rest your left arm so that the cuff is about heart level. If the cuff goes on your upper arm,  then just relax the arm on the table, arm of the chair or your lap. If you have a wrist cuff, we  suggest relaxing your wrist against your chest (think of it as Pledging the Flag with the  wrong arm).  3. Place the cuff snugly around your arm, about 1 inch above the crook of your elbow. The  cords should be inside the groove of your elbow.  4. Sit quietly, with the cuff in place, for about 5 minutes. After that 5 minutes press the power  button to start a reading. 5. Do not talk or move while the reading is taking place.  6. Record your readings on a sheet of paper. Although most cuffs have a memory, it is often  easier to see a pattern developing when the numbers are all in front of you.  7. You can repeat the reading after 1-3 minutes if it is recommended  Make sure your bladder is empty and you have not had caffeine or tobacco within the last 30 min  Always bring your blood pressure log with you to your appointments. If you have not brought your monitor in to be double checked for accuracy, please bring it to your next appointment.  You can find a list of quality blood pressure cuffs at [email or web address]  Important lifestyle changes to control high blood pressure  Intervention  Effect on the BP  Lose extra pounds and watch your  waistline Weight loss is one of the most effective lifestyle changes for controlling blood pressure. If you're overweight or obese, losing even a small amount of weight can help reduce blood pressure. Blood pressure might go down by about 1 millimeter of mercury (mm Hg) with each kilogram (about 2.2 pounds) of weight lost.  Exercise regularly As a general goal, aim for at least 30 minutes of moderate physical activity every day. Regular physical activity can lower high blood pressure by about 5 to 8 mm Hg.  Eat a healthy diet Eating a diet rich in whole grains, fruits, vegetables, and low-fat dairy products and low in saturated fat and cholesterol. A healthy diet can lower high blood pressure by up to 11 mm Hg.  Reduce salt (sodium) in your diet Even a small reduction of sodium in the diet can improve heart health and reduce high blood pressure by about 5 to 6 mm Hg.  Limit alcohol One drink equals 12 ounces of beer, 5 ounces of wine, or 1.5 ounces of 80-proof liquor.  Limiting alcohol to less than one drink a day for women or two drinks a day for men can help lower blood pressure by about 4 mm Hg.   If you have any questions or concerns  please use My Chart to send questions or call the office at [phone]

## 2023-11-01 NOTE — Assessment & Plan Note (Addendum)
 Assessment: BP is uncontrolled in office BP 153/108 mmHg;  above the goal (<120/80). Tolerates current medications well without any side effects Denies SOB, palpitation, chest pain,, headaches,or swelling Reiterated the importance of regular exercise and low salt diet   Plan:  Advised patient to take extra 50 mg dose of hydralazine any time BP is > 140/80 at home.  He states hasn't seen any readings as high as what we had in the office and will check home pressure today before deciding to take extra hydralazine.   Continue taking spironolactone 50 mg every day, hydralazine 50 mg bid, isosorbide mono 30 mg every day, torsemide 40 mg every day, labetalol 400 mg twice daily Patient to keep record of BP readings with heart rate and report to [place] at the next visit Patient to follow up with PharmD in 1 month  Labs ordered today:  none

## 2023-12-02 ENCOUNTER — Telehealth: Payer: Self-pay

## 2023-12-02 ENCOUNTER — Ambulatory Visit: Attending: Cardiovascular Disease | Admitting: Pharmacist Clinician (PhC)/ Clinical Pharmacy Specialist

## 2023-12-02 ENCOUNTER — Encounter: Payer: Self-pay | Admitting: Pharmacist Clinician (PhC)/ Clinical Pharmacy Specialist

## 2023-12-02 VITALS — BP 106/67 | HR 92

## 2023-12-02 DIAGNOSIS — I10 Essential (primary) hypertension: Secondary | ICD-10-CM | POA: Diagnosis not present

## 2023-12-02 NOTE — Patient Instructions (Signed)
 Follow up appointment: with Dr. Bjorn Pippin in May  (in our new building on [address])  Take your BP meds as follows: continue with your current medications  Check your blood pressure at home daily (if able) and keep record of the readings.  Your blood pressure goal is < 120/80  To check your pressure at home you will need to:  1. Sit up in a chair, with feet flat on the floor and back supported. Do not cross your ankles or legs. 2. Rest your left arm so that the cuff is about heart level. If the cuff goes on your upper arm,  then just relax the arm on the table, arm of the chair or your lap. If you have a wrist cuff, we  suggest relaxing your wrist against your chest (think of it as Pledging the Flag with the  wrong arm).  3. Place the cuff snugly around your arm, about 1 inch above the crook of your elbow. The  cords should be inside the groove of your elbow.  4. Sit quietly, with the cuff in place, for about 5 minutes. After that 5 minutes press the power  button to start a reading. 5. Do not talk or move while the reading is taking place.  6. Record your readings on a sheet of paper. Although most cuffs have a memory, it is often  easier to see a pattern developing when the numbers are all in front of you.  7. You can repeat the reading after 1-3 minutes if it is recommended  Make sure your bladder is empty and you have not had caffeine or tobacco within the last 30 min  Always bring your blood pressure log with you to your appointments. If you have not brought your monitor in to be double checked for accuracy, please bring it to your next appointment.  You can find a list of quality blood pressure cuffs at [email or web address]  Important lifestyle changes to control high blood pressure  Intervention  Effect on the BP  Lose extra pounds and watch your waistline Weight loss is one of the most effective lifestyle changes for controlling blood pressure. If you're overweight or obese,  losing even a small amount of weight can help reduce blood pressure. Blood pressure might go down by about 1 millimeter of mercury (mm Hg) with each kilogram (about 2.2 pounds) of weight lost.  Exercise regularly As a general goal, aim for at least 30 minutes of moderate physical activity every day. Regular physical activity can lower high blood pressure by about 5 to 8 mm Hg.  Eat a healthy diet Eating a diet rich in whole grains, fruits, vegetables, and low-fat dairy products and low in saturated fat and cholesterol. A healthy diet can lower high blood pressure by up to 11 mm Hg.  Reduce salt (sodium) in your diet Even a small reduction of sodium in the diet can improve heart health and reduce high blood pressure by about 5 to 6 mm Hg.  Limit alcohol One drink equals 12 ounces of beer, 5 ounces of wine, or 1.5 ounces of 80-proof liquor.  Limiting alcohol to less than one drink a day for women or two drinks a day for men can help lower blood pressure by about 4 mm Hg.   If you have any questions or concerns please use My Chart to send questions or call the office at [phone]

## 2023-12-02 NOTE — Progress Notes (Signed)
 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 male patient of Dr Bjorn Pippin, in the office today for hypertension evaluation.   He has an extensive cardiac history, including aortic dissection.  He states that he was diagnosed with hypertension many years ago, but didn't like the idea of taking medications for life, so he quit taking them.  He has been more compliant in the past couple of years, since his first surgery for the dissection.   He has taken multiple medications for his blood pressure, and at his last hospitalization stopped diltiazem and lisinopril.  He noted that home reading are all WNL, but did not bring home device with him.  Today he returns for follow up.  Eating more since quit smoking several months ago.  At his last visit hew was noted to be using miswak (an African dental stick) that was licorice flavored.  He has since switched to using peppermint flavored instead.  No concerns about his medications today.  His home BP device worked well until about a week ago.  He brings it into the office today and there is a small hole in the tubing - most likely from his dog.  He will look at getting a replacement cuff.     Cuff stopped working about a week ago  Highest was 147, but otherwise  127-138  with lowest at 123/62  Blood Pressure Goal:  120/80 (due to extensive aortic repair)    Current Medications:  spironolactone 50 mg every day, hydralazine 50 mg bid, isosorbide mono 30 mg every day, torsemide 40  mg every day, labetalol 400 mg twice daily  Previously tried:  all d/c secondary to BP better controlled  Lisinopril   Nifedipine xl 90  Valsartan 80   Diltiazem XR 240  Family Hx:   mother had heart failure, at 109; 2 siblings without issues; children 71, 42, 26, none with hypertension.   Social Hx:      Tobacco: quit about 4 months ago  Alcohol: no  Caffeine: no  Diet:    lots of fruits and nuts, beet juice, more home cooked meals, only eats out occasionally; chicken and fish for protein; lots of vegetables, mostly fresh  Exercise: PT until changed insurance, now not covered, just resumed, hasn't ben back  Home BP readings:  Cuff stopped working about a week ago  systolic - highest was 147, but otherwise  127-138  with lowest at 123.  Diastolic all WNL  Accessory Clinical Findings    Lab Results  Component Value Date   CREATININE 1.26 09/24/2023   BUN 20 09/24/2023   NA 144 09/24/2023   K 3.9 09/24/2023   CL 101 09/24/2023   CO2 26 09/24/2023   Lab Results  Component Value Date   ALT 12 09/10/2023   AST 17 09/10/2023   ALKPHOS 74 09/10/2023   BILITOT 0.8 09/10/2023   Lab Results  Component Value Date   HGBA1C 4.6 (L) 09/02/2020    Home Medications  Current Outpatient Medications  Medication Sig Dispense Refill   acetaminophen (TYLENOL) 325 MG tablet Take 2 tablets (650 mg total) by mouth every 6 (six) hours as needed for moderate pain.     aspirin EC 81 MG tablet Take 81 mg by mouth daily. Swallow whole.     atorvastatin (LIPITOR) 80 MG tablet Take 1 tablet (80 mg total) by mouth daily. 30 tablet 1   ELIQUIS 5 MG TABS tablet TAKE 1 TABLET (5 MG TOTAL) BY MOUTH 2 (TWO) TIMES DAILY. 180 tablet 3   gabapentin (NEURONTIN) 400 MG capsule TAKE 1 CAPSULE (400 MG TOTAL) BY MOUTH THREE TIMES DAILY. 90 capsule 0   hydrALAZINE (APRESOLINE) 50 MG tablet Take 1 tablet by mouth in the morning, at noon, and at bedtime.     isosorbide mononitrate (IMDUR) 30 MG 24 hr tablet  Take 1 tablet (30 mg total) by mouth daily. 30 tablet 2   labetalol (NORMODYNE) 200 MG tablet TAKE 2 TABLETS (400 MG TOTAL) BY MOUTH TWO TIMES DAILY. 180 tablet 3   Multiple Vitamins-Minerals (MULTIVITAMIN WITH MINERALS) tablet Take 1 tablet by mouth daily. Men     spironolactone (ALDACTONE) 50 MG tablet Take 1 tablet (50 mg total) by mouth daily. 30 tablet 11   torsemide 40 MG TABS Take 40 mg by mouth 2 (two) times daily for 5 days, THEN 40 mg daily. You'll need repeat labs within a week to ensure your kidney function and electrolytes are stable.  Weigh yourself daily.  If your weight is increasing by 2-3 lbs in a day or by 5 lbs in a week take an extra dose of torsemide and call your PCP or cardiologist for additional instructions regarding your torsemide.. 40 tablet 0   traZODone (DESYREL) 100 MG tablet Take 100 mg by mouth at bedtime.     No current facility-administered medications for this visit.         Assessment & Plan    Essential hypertension Assessment: BP is controlled in office BP 106/67 mmHg; Tolerates medications well without any side effects Denies SOB, palpitation, chest pain, headaches,or swelling Has stopped using licorice products Reiterated the importance of regular exercise and low salt diet   Plan:  Continue taking spironolactone 50 mg every day, hydralazine 50 mg bid, isosorbide mono 30 mg every day, torsemide 40 mg every day, labetalol 400 mg twice daily Patient to keep record of BP readings with heart rate and report to [place] at the next visit Patient to follow up with Dr. Bjorn Pippin in late May  Labs ordered today:  none   Phillips Hay PharmD CPP [facility]  [address] [place], [place] [address] [phone]

## 2023-12-02 NOTE — Telephone Encounter (Signed)
**Note De-Identified via [name]** **Note De-Identified Connor [name]** Ordering provider: Dr Bjorn Pippin Associated diagnoses: [id number]  Watch[employer] PA obtained on 12/02/2023 by Connor [name], Connor Formosa, LPN. Authorization: Per the [facility] Provider Portal: Prior Authorization/Notification is not required for the requested service(s). Decision ID #: [id number]  Patient notified of PIN (1234) on 12/02/2023 Connor [name] Notification Method: phone.  Phone note routed to covering staff for follow-up.

## 2023-12-02 NOTE — Assessment & Plan Note (Signed)
 Assessment: BP is controlled in office BP 106/67 mmHg; Tolerates medications well without any side effects Denies SOB, palpitation, chest pain, headaches,or swelling Has stopped using licorice products Reiterated the importance of regular exercise and low salt diet   Plan:  Continue taking spironolactone 50 mg every day, hydralazine 50 mg bid, isosorbide mono 30 mg every day, torsemide 40 mg every day, labetalol 400 mg twice daily Patient to keep record of BP readings with heart rate and report to [place] at the next visit Patient to follow up with Dr. Bjorn Pippin in late May  Labs ordered today:  none

## 2024-01-10 ENCOUNTER — Telehealth: Payer: Self-pay

## 2024-01-10 NOTE — Telephone Encounter (Signed)
 No report in [employer] yet per [employer]

## 2024-01-10 NOTE — Telephone Encounter (Signed)
   Pre-operative Risk Assessment    Patient Name: Connor [name]  DOB: May 22, 1956 MRN: [id number]   Date of last office visit: 09/24/23 with Dr. Alda Amas Date of next office visit: 01/22/24 with Dr. Alda Amas  Request for Surgical Clearance    Procedure:  Dental Extraction - Amount of Teeth to be Pulled:  9 upper teeth- Surgical Extraction   Date of Surgery:  Clearance TBD                                Surgeon:  Dr. Emory Harps  Surgeon's Group or Practice Name:  [facility] in [place]  Phone number:  [phone] Fax number:  [phone]   Type of Clearance Requested:   - Medical  - Pharmacy:  Hold Aspirin  and Apixaban  (Eliquis ) They asked if he could take a "holiday" from Eliquis  Safely and for how many days?    Type of Anesthesia:  Local    Additional requests/questions:   Signed, Sudie Ely   01/10/2024, 12:30 PM

## 2024-01-13 NOTE — Telephone Encounter (Signed)
   Name: Connor [name]  DOB: 1956-05-15  MRN: [id number]  Primary Cardiologist: Wendie Hamburg, MD  Chart reviewed as part of pre-operative protocol coverage. The patient has an upcoming visit scheduled with Dr. Alda Amas on 01/22/2024 at which time clearance can be addressed in case there are any issues that would impact surgical recommendations.  Dental extraction and is not scheduled until TBD as below. I added preop FYI to appointment note so that provider is aware to address at time of outpatient visit.  Per office protocol the cardiology provider should forward their finalized clearance decision and recommendations regarding antiplatelet therapy to the requesting party below.    Patient does not require pre-op antibiotics for dental procedure.   Per office protocol, patient can hold Eliquis  for 3 days prior to procedure.   Patient will not need bridging with Lovenox  (enoxaparin ) around procedure.  I will route this message as FYI to requesting party and remove this message from the preop box as separate preop APP input not needed at this time.   Please call with any questions.  Ava Boatman, NP  01/13/2024, 4:44 PM

## 2024-01-13 NOTE — Telephone Encounter (Signed)
 Patient with diagnosis of atrial fibrillation on Eliquis  for anticoagulation.    Procedure:  Dental Extraction - Amount of Teeth to be Pulled:  9 upper teeth- Surgical Extraction    Date of Surgery:  Clearance TBD    CHA2DS2-VASc Score = 3   This indicates a 3.2% annual risk of stroke. The patient's score is based upon: CHF History: 1 HTN History: 0 Diabetes History: 1 Stroke History: 0 Vascular Disease History: 0 Age Score: 1 Gender Score: 0   CrCl 90 Platelet count 185  Patient has not had an Afib/aflutter ablation within the last 3 months or DCCV within the last 30 days  Patient does not require pre-op antibiotics for dental procedure.  Per office protocol, patient can hold Eliquis  for 3 days prior to procedure.   Patient will not need bridging with Lovenox  (enoxaparin ) around procedure.  **This guidance is not considered finalized until pre-operative APP has relayed final recommendations.**

## 2024-01-21 NOTE — Progress Notes (Unsigned)
 Cardiology Office Note:    Date:  01/22/2024   ID:  Connor [name], DOB 1956-08-03, MRN [id number]  PCP:  Caresse Chant, FNP  Cardiologist:  Wendie Hamburg, MD  Electrophysiologist:  None   Referring MD: Caresse Chant, FNP   Chief Complaint  Patient presents with   Hypertension     History of Present Illness:    Connor [name] is a 68 y.o. male with a hx of aortic dissection who presents for follow-up.  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  He underwent left common and external iliac artery and left renal vein stents on 09/19/2018 in preparation for total aortic arch repair.  Course was complicated by cardiogenic shock (LVEF down to 15%, which has since recovered), AKI requiring CVVH (with subsequent recovery of renal function), extensive left upper extremity DVT.  On 10/03/2018, he underwent total arch replacement, frozen elephant trunk, and head vessel reconstruction with Dr.Takayama and Dr. Lydia Sams.  Course was complicated by sternal wound infection, with cultures growing Klebsiella.  He completed a 14-day course of cefadroxil.  He was following closely with cardiology and nephrology in [place]  for blood pressure control.  Moved to [place] in July to help with a family issue.   He was in the ED on 06/19/2019 with chest tightness and shortness of breath.  Troponins negative x2.  CTA chest showed patent graft of the ascending aortic component, patent graft of the proximal descending aorta with, thrombosis of the proximal descending thoracic aortic dissection false lumen along the stent graft, patent type B aortic dissection of the mid descending thoracic aorta extending to the abdominal aortic bifurcation.  Reports that he feels chest pain has been related to muscle cramps from low potassium.  Now taking potassium supplements.  States that he also has chest pain across the incision in  the right upper chest when he does stretches.  TTE on 10/02/2019 showed EF 60-65%, severe LVH, normal RV function, no significant valvular disease.  At clinic visit on 01/05/2020, he was found to be in atrial fibrillation.  Underwent successful DCCV on 02/11/2020.   He was admitted to [facility] from 1/4 through 09/19/2020 with necrotizing fasciitis of the left leg and strep bacteremia.  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  Echocardiogram 09/01/2020 showed EF 60 to 65%, severe LVH, grade 2 diastolic dysfunction, normal RV function, severe left atrial dilatation, moderate right atrial dilatation, no significant valvular disease.  TEE on 09/05/2020 showed no vegetations, positive bubble study.  He was admitted 11/2020 with uncontrolled hypertension and acute on chronic diastolic heart failure, improved with IV Lasix .  He was admitted in June 2022 to [facility] with type B aortic dissection and hypertensive crisis.  On 02/06/2021 he underwent TEVAR extension and left renal artery stenting.  He was admitted 08/2023 with acute on chronic diastolic heart failure.  He was diuresed, dry weight felt to be 244 pounds.  He was discharged on twice daily torsemide  with plans to transition to daily torsemide  after 5 days.  Also noted to have right lower extremity wounds, was referred to wound clinic.  Since his last clinic visit, he reports he is doing okay.  States that sometimes has chest pain when moves upper body certain ways but denies any exertional chest pain or dyspnea.  Denies any lightheadedness, syncope, lower extremity edema.  Reports occasional palpitations.  He is taking torsemide  20 mg  twice daily.  He is taking Eliquis , denies any bleeding issues.   Wt Readings from Last 3 Encounters:  01/22/24 265 lb 9.6 oz (120.5 kg)  09/24/23 243 lb (110.2 kg)  09/10/23 245 lb 6 oz (111.3 kg)     Past Medical History:  Diagnosis Date   AAA (abdominal aortic aneurysm) (HCC)    History of open heart surgery    Hypertension     Seroma due to trauma [facility])     Past Surgical History:  Procedure Laterality Date   ABDOMINAL AORTIC ANEURYSM REPAIR     AMPUTATION Left 09/07/2020   Procedure: ATTEMPTED LEFT LEG DEBRIDEMENT FASCIOTOMIES, APPLY INSTILLATION WOUND VAC, ABOVE KNEE AMPUTATION;  Surgeon: Timothy Ford, MD;  Location: [facility] OR;  Service: Orthopedics;  Laterality: Left;   BUBBLE STUDY  09/05/2020   Procedure: BUBBLE STUDY;  Surgeon: Sonny Dust, MD;  Location: [facility] ENDOSCOPY;  Service: Cardiovascular;;   CARDIOVERSION N/A 02/11/2020   Procedure: CARDIOVERSION;  Surgeon: Wendie Hamburg, MD;  Location: [facility] ENDOSCOPY;  Service: Cardiovascular;  Laterality: N/A;   CORONARY ARTERY BYPASS GRAFT     STUMP REVISION Left 09/09/2020   Procedure: REVISION LEFT ABOVE KNEE AMPUTATION;  Surgeon: Timothy Ford, MD;  Location[facility];  Service: Orthopedics;  Laterality: Left;   TEE WITHOUT CARDIOVERSION N/A 09/05/2020   Procedure: TRANSESOPHAGEAL ECHOCARDIOGRAM (TEE);  Surgeon: Sonny Dust, MD;  Location: [facility] ENDOSCOPY;  Service: Cardiovascular;  Laterality: N/A;    Current Medications: Current Meds  Medication Sig   acetaminophen  (TYLENOL ) 325 MG tablet Take 2 tablets (650 mg total) by mouth every 6 (six) hours as needed for moderate pain.   aspirin  EC 81 MG tablet Take 81 mg by mouth daily. Swallow whole.   atorvastatin  (LIPITOR) 80 MG tablet Take 1 tablet (80 mg total) by mouth daily.   ELIQUIS  5 MG TABS tablet TAKE 1 TABLET (5 MG TOTAL) BY MOUTH 2 (TWO) TIMES DAILY.   gabapentin  (NEURONTIN ) 400 MG capsule TAKE 1 CAPSULE (400 MG TOTAL) BY MOUTH THREE TIMES DAILY.   hydrALAZINE  (APRESOLINE ) 50 MG tablet Take 1 tablet by mouth in the morning, at noon, and at bedtime.   isosorbide  mononitrate (IMDUR ) 30 MG 24 hr tablet Take 1 tablet (30 mg total) by mouth daily.   labetalol  (NORMODYNE ) 200 MG tablet TAKE 2 TABLETS (400 MG TOTAL) BY MOUTH TWO TIMES DAILY.   Multiple Vitamins-Minerals (MULTIVITAMIN WITH  MINERALS) tablet Take 1 tablet by mouth daily. Men   spironolactone  (ALDACTONE ) 50 MG tablet Take 1 tablet (50 mg total) by mouth daily.   torsemide  40 MG TABS Take 40 mg by mouth 2 (two) times daily for 5 days, THEN 40 mg daily. You'll need repeat labs within a week to ensure your kidney function and electrolytes are stable.  Weigh yourself daily.  If your weight is increasing by 2-3 lbs in a day or by 5 lbs in a week take an extra dose of torsemide  and call your PCP or cardiologist for additional instructions regarding your torsemide ..   traZODone  (DESYREL ) 100 MG tablet Take 100 mg by mouth at bedtime.     Allergies:   Patient has no known allergies.   Social History   Socioeconomic History   Marital status: Single    Spouse name: Not on file   Number of children: Not on file   Years of education: Not on file   Highest education level: Not on file  Occupational History   Not on file  Tobacco Use   Smoking status: Former    Current packs/day: 0.00    Types: Cigarettes    Quit date: 07/26/2023    Years since quitting: 0.4   Smokeless tobacco: Never  Vaping Use   Vaping status: Never Used  Substance and Sexual Activity   Alcohol use: Not Currently    Comment: occ   Drug use: Never   Sexual activity: Not Currently    Partners: Female  Other Topics Concern   Not on file  Social History Narrative   Not on file   Social Drivers of Health   Financial Resource Strain: Low Risk  (02/06/2021)   [facility]   Overall Financial Resource Strain (CARDIA)    Difficulty of Paying Living Expenses: Not very hard  Food Insecurity: No Food Insecurity (09/03/2023)   Hunger Vital Sign    Worried About Running Out of Food in the Last Year: Never true    Ran Out of Food in the Last Year: Never true  Transportation Needs: No Transportation Needs (09/03/2023)   PRAPARE - Administrator, Civil Service (Medical): No    Lack of Transportation  (Non-Medical): No  Physical Activity: Not on file  Stress: Not on file  Social Connections: Moderately Integrated (09/03/2023)   Social Connection and Isolation Panel [NHANES]    Frequency of Communication with Friends and Family: Twice a week    Frequency of Social Gatherings with Friends and Family: Three times a week    Attends Religious Services: More than 4 times per year    Active Member of Clubs or Organizations: Yes    Attends Banker Meetings: More than 4 times per year    Marital Status: Never married     Family History: Mother died of MI at age 51.  Father had stroke at age 19  ROS:   Please see the history of present illness.     All other systems reviewed and are negative.  EKGs/Labs/Other Studies Reviewed:    The following studies were reviewed today:   EKG:   09/24/23: Sinus rhythm with PVC, left axis deviation, rate 73, QTc 460 11/07/21: Sinus rhythm, rate 99, first-degree AV block, left axis deviation, QTc 492, no ST abnormalities 07/27/2021: Sinus rhythm, rate 65, first-degree AV block chronic, left axis deviation, no ST abnormalities  TTE 10/02/19:  1. Left ventricular ejection fraction, by visual estimation, is 60 to  65%. The left ventricle has normal function. Left ventricular septal wall  thickness was severely increased. Severely increased left ventricular  posterior wall thickness. There is  severely increased left ventricular hypertrophy.   2. Left ventricular diastolic function could not be evaluated.   3. The left ventricle has no regional wall motion abnormalities.   4. Global right ventricle has normal systolic function.The right  ventricular size is normal. No increase in right ventricular wall  thickness.   5. Left atrial size was moderately dilated.   6. Right atrial size was normal.   7. The mitral valve is normal in structure. Trivial mitral valve  regurgitation. No evidence of mitral stenosis.   8. The tricuspid valve is normal in  structure.   9. The tricuspid valve is normal in structure. Tricuspid valve  regurgitation is not demonstrated.  10. The aortic valve is tricuspid. Aortic valve regurgitation is not  visualized. No evidence of aortic valve sclerosis or stenosis.  11. The pulmonic valve was normal in structure. Pulmonic valve  regurgitation is not visualized.  12. The inferior vena cava is normal in size with greater than 50%  respiratory variability, suggesting right atrial pressure of 3 mmHg.   Recent Labs: 09/08/2023: B Natriuretic Peptide 53.0 09/10/2023: ALT 12; Hemoglobin 10.0; Platelets 185 09/24/2023: BUN 20; Creatinine, Ser 1.26; Magnesium  1.9; Potassium 3.9; Sodium 144  Recent Lipid Panel    Component Value Date/Time   CHOL 148 07/27/2021 1030   TRIG 50 07/27/2021 1030   HDL 46 07/27/2021 1030   CHOLHDL 3.2 07/27/2021 1030   CHOLHDL NOT CALCULATED 09/04/2020 1446   VLDL 35 09/04/2020 1446   LDLCALC 91 07/27/2021 1030    Physical Exam:    VS:  BP 114/66 (BP Location: Left Arm, Patient Position: Sitting, Cuff Size: Normal)   Pulse 61   Ht 5\' 10"  (1.778 m)   Wt 265 lb 9.6 oz (120.5 kg)   SpO2 96%   BMI 38.11 kg/m     Wt Readings from Last 3 Encounters:  01/22/24 265 lb 9.6 oz (120.5 kg)  09/24/23 243 lb (110.2 kg)  09/10/23 245 lb 6 oz (111.3 kg)     GEN: Well nourished, well developed in no acute distress HEENT: Normal NECK: No JVD CARDIAC: RRR, 2/6 systolic murmur RESPIRATORY:  Clear to auscultation without rales, wheezing or rhonchi  ABDOMEN: Soft, non-tender, non-distended MUSCULOSKELETAL: Trace edema SKIN: Warm and dry NEUROLOGIC:  Alert and oriented x 3 PSYCHIATRIC:  Normal affect   ASSESSMENT:    1. Chronic diastolic heart failure (HCC)   2. Resistant hypertension   3. Dissection of aorta, unspecified portion of aorta (HCC)   4. Pre-operative clearance   5. Essential hypertension   6. Obstructive sleep apnea (adult) (pediatric)      PLAN:    Chronic  diastolic heart failure: Echo 08/11/2023 showed EF 60 to 65%, severe LVH, grade 2 diastolic dysfunction, normal RV function, moderate biatrial enlargement.  Admission 08/2023 with acute on chronic diastolic heart failure.  Responded to diuresis, dry weight felt to be 244 pounds. -Continue torsemide  40 mg daily.  Check BMET, magnesium   Preop evaluation: Prior to teeth extraction.  Okay from cardiology standpoint to proceed with the procedure, can hold Eliquis  2 days prior to procedure.  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  He was admitted in June 2022 to [facility] with type B aortic dissection and hypertensive crisis.  On 02/06/2021 he underwent TEVAR extension and left renal artery stenting. -Follows with [facility] vascular surgery -Goal SBP less than 120, heart rate less than 60.     Persistent atrial fibrillation: At clinic visit on 01/05/2020, he was found to be in atrial fibrillation.  Underwent successful DCCV on 02/11/2020, but subsequently back in AF.  CHADS-VASc score 2 given HTN, vascular disease. TTE shows normal LVEF.  Currently in sinus rhythm. -Continue Eliquis  5 mg twice daily -Continue labetalol    Resistant hypertension: Currently on spironolactone  50 mg daily, hydralazine  50 mg 3 times daily, diltiazem  240 mg daily, Imdur  30 mg daily, torsemide  40 mg daily, labetalol  400 mg twice daily. -Given resistant hypertension and also issues with persistent hypokalemia, concern for hyperaldosteronism.  Renin/aldosterone ratio >60, have referred to endocrinology and started on spironolactone .  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  Sleep study was ordered again at last clinic visit but there was issue with Itamar device, will need reordered -Currently appears controlled, no changes recommended   OSA: positive sleep study 11/2021, but was not started on CPAP.  He is agreeable to proceeding with CPAP.  Will update sleep study.  STOP-BANG 6  Hyperlipidemia: on atorvastatin  80 mg daily.     RTC in 6 months  Medication Adjustments/Labs and Tests Ordered: Current medicines are reviewed at length with the patient today.  Concerns regarding medicines are outlined above.  Orders Placed This Encounter  Procedures   Basic Metabolic Panel (BMET)   Magnesium    Lipid panel   EKG 12-Lead    No orders of the defined types were placed in this encounter.    Patient Instructions  Medication Instructions:  Continue current medications *If you need a refill on your cardiac medications before your next appointment, please call your pharmacy*  Lab Work: Bmet, mg, lipid today If you have labs (blood work) drawn today and your tests are completely normal, you will receive your results only by: MyChart Message (if you have MyChart) OR A paper copy in the mail If you have any lab test that is abnormal or we need to change your treatment, we will call you to review the results.  Testing/Procedures: none  Follow-Up: At [facility], you and your health needs are our priority.  As part of our continuing mission to provide you with exceptional heart care, our providers are all part of one team.  This team includes your primary Cardiologist (physician) and Advanced Practice Providers or APPs (Physician Assistants and Nurse Practitioners) who all work together to provide you with the care you need, when you need it.  Your next appointment:   6 month(s)  Provider:   Wendie Hamburg, MD    We recommend signing up for the patient portal called "MyChart".  Sign up information is provided on this After Visit Summary.  MyChart is used to connect with patients for Virtual Visits (Telemedicine).  Patients are able to view lab/test results, encounter notes, upcoming appointments, etc.  Non-urgent messages can be sent to your provider as well.   To learn more about what you can do with MyChart, go to [email or web address].   Other Instructions Replaced Itamar today, patient could not use first one given       Signed, Wendie Hamburg, MD  01/22/2024 5:39 PM    [facility]

## 2024-01-22 ENCOUNTER — Ambulatory Visit: Payer: 59 | Attending: Cardiology | Admitting: Cardiology

## 2024-01-22 ENCOUNTER — Encounter: Payer: Self-pay | Admitting: Cardiology

## 2024-01-22 VITALS — BP 114/66 | HR 61 | Ht 70.0 in | Wt 265.6 lb

## 2024-01-22 DIAGNOSIS — I5032 Chronic diastolic (congestive) heart failure: Secondary | ICD-10-CM | POA: Diagnosis not present

## 2024-01-22 DIAGNOSIS — G4733 Obstructive sleep apnea (adult) (pediatric): Secondary | ICD-10-CM

## 2024-01-22 DIAGNOSIS — I10 Essential (primary) hypertension: Secondary | ICD-10-CM

## 2024-01-22 DIAGNOSIS — I1A0 Resistant hypertension: Secondary | ICD-10-CM

## 2024-01-22 DIAGNOSIS — I7100 Dissection of unspecified site of aorta: Secondary | ICD-10-CM | POA: Diagnosis not present

## 2024-01-22 DIAGNOSIS — Z01818 Encounter for other preprocedural examination: Secondary | ICD-10-CM | POA: Diagnosis not present

## 2024-01-22 NOTE — Patient Instructions (Signed)
 Medication Instructions:  Continue current medications *If you need a refill on your cardiac medications before your next appointment, please call your pharmacy*  Lab Work: Bmet, mg, lipid today If you have labs (blood work) drawn today and your tests are completely normal, you will receive your results only by: MyChart Message (if you have MyChart) OR A paper copy in the mail If you have any lab test that is abnormal or we need to change your treatment, we will call you to review the results.  Testing/Procedures: none  Follow-Up: At [facility], you and your health needs are our priority.  As part of our continuing mission to provide you with exceptional heart care, our providers are all part of one team.  This team includes your primary Cardiologist (physician) and Advanced Practice Providers or APPs (Physician Assistants and Nurse Practitioners) who all work together to provide you with the care you need, when you need it.  Your next appointment:   6 month(s)  Provider:   Wendie Hamburg, MD    We recommend signing up for the patient portal called "MyChart".  Sign up information is provided on this After Visit Summary.  MyChart is used to connect with patients for Virtual Visits (Telemedicine).  Patients are able to view lab/test results, encounter notes, upcoming appointments, etc.  Non-urgent messages can be sent to your provider as well.   To learn more about what you can do with MyChart, go to [email or web address].   Other Instructions Replaced Connor [name] today, patient could not use first one given

## 2024-01-23 ENCOUNTER — Ambulatory Visit: Payer: Self-pay | Admitting: Cardiology

## 2024-01-23 LAB — BASIC METABOLIC PANEL WITH GFR
BUN/Creatinine Ratio: 14 (ref 10–24)
BUN: 20 mg/dL (ref 8–27)
CO2: 23 mmol/L (ref 20–29)
Calcium: 9.2 mg/dL (ref 8.6–10.2)
Chloride: 104 mmol/L (ref 96–106)
Creatinine, Ser: 1.38 mg/dL — ABNORMAL HIGH (ref 0.76–1.27)
Glucose: 80 mg/dL (ref 70–99)
Potassium: 4.5 mmol/L (ref 3.5–5.2)
Sodium: 144 mmol/L (ref 134–144)
eGFR: 56 mL/min/{1.73_m2} — ABNORMAL LOW (ref >59)

## 2024-01-23 LAB — LIPID PANEL
Chol/HDL Ratio: 4.8 ratio (ref 0.0–5.0)
Cholesterol, Total: 159 mg/dL (ref 100–199)
HDL: 33 mg/dL — ABNORMAL LOW (ref >39)
LDL Chol Calc (NIH): 110 mg/dL — ABNORMAL HIGH (ref 0–99)
Triglycerides: 82 mg/dL (ref 0–149)
VLDL Cholesterol Cal: 16 mg/dL (ref 5–40)

## 2024-01-23 LAB — MAGNESIUM: Magnesium: 2.3 mg/dL (ref 1.6–2.3)

## 2024-04-13 ENCOUNTER — Ambulatory Visit (INDEPENDENT_AMBULATORY_CARE_PROVIDER_SITE_OTHER): Admitting: Urology

## 2024-04-13 VITALS — BP 106/66 | HR 80

## 2024-04-13 DIAGNOSIS — R972 Elevated prostate specific antigen [PSA]: Secondary | ICD-10-CM

## 2024-04-13 NOTE — Progress Notes (Unsigned)
 04/13/2024 3:17 PM   Connor [name] [birth year] [id number]  Referring provider: Myra Geni ORN, FNP [address] [place],  [place] [address]  No chief complaint on file.   HPI:  New pt -   1) PSA elevation - His 05/25 PSA 6.0, 25% free.  05/25 PSA 5.1. 05/24 PSA 5.1. A Jan 2022 CT prostate 4.7 x 3.9 x 4.1 = 40 g. No voiding issues. Some frequency with torsemide . No gross hematuria.   H/o CHF, CAD, PVD and DVT. Afib, CKD. Cr 1.4- 1.7. On eliquis  5 mg. On isosorbide . Left AKA in 2021.   PMH: Past Medical History:  Diagnosis Date   AAA (abdominal aortic aneurysm) (HCC)    History of open heart surgery    Hypertension    Seroma due to trauma [facility])     Surgical History: Past Surgical History:  Procedure Laterality Date   ABDOMINAL AORTIC ANEURYSM REPAIR     AMPUTATION Left 09/07/2020   Procedure: ATTEMPTED LEFT LEG DEBRIDEMENT FASCIOTOMIES, APPLY INSTILLATION WOUND VAC, ABOVE KNEE AMPUTATION;  Surgeon: Harden Jerona GAILS, MD;  Location: [facility] OR;  Service: Orthopedics;  Laterality: Left;   BUBBLE STUDY  09/05/2020   Procedure: BUBBLE STUDY;  Surgeon: Hobart Powell BRAVO, MD;  Location: [facility] ENDOSCOPY;  Service: Cardiovascular;;   CARDIOVERSION N/A 02/11/2020   Procedure: CARDIOVERSION;  Surgeon: Kate Lonni CROME, MD;  Location: [facility] ENDOSCOPY;  Service: Cardiovascular;  Laterality: N/A;   CORONARY ARTERY BYPASS GRAFT     STUMP REVISION Left 09/09/2020   Procedure: REVISION LEFT ABOVE KNEE AMPUTATION;  Surgeon: Harden Jerona GAILS, MD;  Location: [facility] OR;  Service: Orthopedics;  Laterality: Left;   TEE WITHOUT CARDIOVERSION N/A 09/05/2020   Procedure: TRANSESOPHAGEAL ECHOCARDIOGRAM (TEE);  Surgeon: Hobart Powell BRAVO, MD;  Location: [facility] ENDOSCOPY;  Service: Cardiovascular;  Laterality: N/A;    Home Medications:  Allergies as of 04/13/2024   No Known Allergies      Medication List        Accurate as of April 13, 2024  3:17 PM. If you have any questions, ask your nurse or doctor.           acetaminophen  325 MG tablet Commonly known as: TYLENOL  Take 2 tablets (650 mg total) by mouth every 6 (six) hours as needed for moderate pain.   aspirin  EC 81 MG tablet Take 81 mg by mouth daily. Swallow whole.   atorvastatin  80 MG tablet Commonly known as: LIPITOR Take 1 tablet (80 mg total) by mouth daily.   Eliquis  5 MG Tabs tablet Generic drug: apixaban  TAKE 1 TABLET (5 MG TOTAL) BY MOUTH 2 (TWO) TIMES DAILY.   gabapentin  400 MG capsule Commonly known as: NEURONTIN  TAKE 1 CAPSULE (400 MG TOTAL) BY MOUTH THREE TIMES DAILY.   hydrALAZINE  50 MG tablet Commonly known as: APRESOLINE  Take 1 tablet by mouth in the morning, at noon, and at bedtime.   isosorbide  mononitrate 30 MG 24 hr tablet Commonly known as: IMDUR  Take 1 tablet (30 mg total) by mouth daily.   labetalol  200 MG tablet Commonly known as: NORMODYNE  TAKE 2 TABLETS (400 MG TOTAL) BY MOUTH TWO TIMES DAILY.   multivitamin with minerals tablet Take 1 tablet by mouth daily. Men   spironolactone  50 MG tablet Commonly known as: ALDACTONE  Take 1 tablet (50 mg total) by mouth daily.   Torsemide  40 MG Tabs Take 40 mg by mouth 2 (two) times daily for 5 days, THEN 40 mg daily. You'll need repeat labs within a week to ensure  your kidney function and electrolytes are stable.  Weigh yourself daily.  If your weight is increasing by 2-3 lbs in a day or by 5 lbs in a week take an extra dose of torsemide  and call your PCP or cardiologist for additional instructions regarding your torsemide .. Start taking on: September 10, 2023   traZODone  100 MG tablet Commonly known as: DESYREL  Take 100 mg by mouth at bedtime.        Allergies: No Known Allergies  Family History: Family History  Problem Relation Age of Onset   CAD Mother    CVA Father     Social History:  reports that he quit smoking about 8 months ago. His smoking use included cigarettes. He has never used smokeless tobacco. He reports that he does not  currently use alcohol. He reports that he does not use drugs.   Physical Exam: There were no vitals taken for this visit.  Constitutional:  Alert and oriented, No acute distress. In wheel chair.  HEENT: [place] AT, moist mucus membranes.  Trachea midline, no masses. Cardiovascular: No clubbing, cyanosis, or edema. Respiratory: Normal respiratory effort, no increased work of breathing. GI: Abdomen is soft, nontender, nondistended, no abdominal masses GU: No CVA tenderness Skin: No rashes, bruises or suspicious lesions. Neurologic: Grossly intact, no focal deficits, moving all 4 extremities. Psychiatric: Normal mood and affect.  Laboratory Data: Lab Results  Component Value Date   WBC 5.3 09/10/2023   HGB 10.0 (L) 09/10/2023   HCT 33.5 (L) 09/10/2023   MCV 96.0 09/10/2023   PLT 185 09/10/2023    Lab Results  Component Value Date   CREATININE 1.38 (H) 01/22/2024    No results found for: PSA  No results found for: TESTOSTERONE  Lab Results  Component Value Date   HGBA1C 4.6 (L) 09/02/2020    Urinalysis    Component Value Date/Time   COLORURINE STRAW (A) 08/10/2023 1650   APPEARANCEUR CLEAR 08/10/2023 1650   LABSPEC 1.006 08/10/2023 1650   PHURINE 7.0 08/10/2023 1650   GLUCOSEU NEGATIVE 08/10/2023 1650   HGBUR SMALL (A) 08/10/2023 1650   BILIRUBINUR NEGATIVE 08/10/2023 1650   KETONESUR NEGATIVE 08/10/2023 1650   PROTEINUR NEGATIVE 08/10/2023 1650   NITRITE NEGATIVE 08/10/2023 1650   LEUKOCYTESUR NEGATIVE 08/10/2023 1650    Lab Results  Component Value Date   BACTERIA NONE SEEN 08/10/2023    Pertinent Imaging:   Assessment & Plan:    PSA elevation - I had a long discussion with the patient on the nature of elevated PSA - benign vs malignant causes. We discussed age specific levels and that PCa can be seen on a biopsy with very low PSA levels (<=2.5), but also discussed risk stratification at various PSA levels. We discussed the nature risks and benefits of  continued surveillance, other lab tests, imaging as well as prostate biopsy. We discussed the management of prostate cancer might include active surveillance or treatment depending on biopsy findings. All questions answered. PSA was sent.   ADD: Aug 2025 PSA 6.8. MRI prostate ordered.   No follow-ups on file.  Donnice Brooks, MD  [facility]  [address] [place], [place] [address] [phone]

## 2024-04-14 ENCOUNTER — Ambulatory Visit: Payer: Self-pay

## 2024-04-14 LAB — PSA: Prostate Specific Ag, Serum: 6.8 ng/mL — ABNORMAL HIGH (ref 0.0–4.0)

## 2024-04-14 NOTE — Addendum Note (Signed)
 Addended by: NIEVES COUGH R on: 04/14/2024 08:16 PM   Modules accepted: Orders

## 2024-04-15 NOTE — Telephone Encounter (Signed)
 Called pt and gave psa results per MD eskridge pt voiced his understanding and stated he was already scheduled for MRI

## 2024-04-15 NOTE — Telephone Encounter (Signed)
-----   Message from Donnice Brooks sent at 04/14/2024  8:17 PM EDT ----- Audrea [place] his PSA has gone up to 6.8, so we need to get an MRI scan of his prostate to screen for any prostate cancer. MRI ordered. I will let him know the results and follow-up - thanks  ----- Message ----- From: Sammie Exie HERO, CMA Sent: 04/14/2024   8:03 AM EDT To: Donnice Brooks, MD  Please review. ----- Message ----- From: Rebecka Memos Lab Results In Sent: 04/14/2024   5:37 AM EDT To: [facility]

## 2024-04-23 ENCOUNTER — Ambulatory Visit (HOSPITAL_COMMUNITY): Admission: RE | Admit: 2024-04-23 | Source: Ambulatory Visit

## 2024-08-10 ENCOUNTER — Ambulatory Visit: Admitting: Urology

## 2024-08-10 ENCOUNTER — Encounter: Payer: Self-pay | Admitting: Urology

## 2024-08-10 VITALS — BP 138/80 | HR 98

## 2024-08-10 DIAGNOSIS — R972 Elevated prostate specific antigen [PSA]: Secondary | ICD-10-CM

## 2024-08-10 NOTE — Progress Notes (Signed)
 08/10/2024 3:22 PM   Connor [name] [birth year] [id number]  Referring provider: Myra Geni ORN, FNP [address] [place],  [place] [address]  No chief complaint on file.   HPI:  F/u -    1) PSA elevation - his May 2025 PSA 6.0, 25% free and PSA 5.1 prior. May 2024 PSA 5.1. A Jan 2022 CT prostate 4.7 x 3.9 x 4.1 = 40 g. No voiding issues. Some frequency with torsemide . No gross hematuria.   Today, seen for the above.  His August 2025 PSA rose to 6.8.  Prostate MRI ordered. He never heard about scheduling. No painful urination. Frequency p diuretics.    H/o CHF, CAD, PVD and DVT. Afib, CKD. Cr 1.4- 1.7. On eliquis  5 mg. On isosorbide . Left AKA in 2021.  PMH: Past Medical History:  Diagnosis Date   AAA (abdominal aortic aneurysm)    History of open heart surgery    Hypertension    Seroma due to trauma     Surgical History: Past Surgical History:  Procedure Laterality Date   ABDOMINAL AORTIC ANEURYSM REPAIR     AMPUTATION Left 09/07/2020   Procedure: ATTEMPTED LEFT LEG DEBRIDEMENT FASCIOTOMIES, APPLY INSTILLATION WOUND VAC, ABOVE KNEE AMPUTATION;  Surgeon: Harden Jerona GAILS, MD;  Location: [facility] OR;  Service: Orthopedics;  Laterality: Left;   BUBBLE STUDY  09/05/2020   Procedure: BUBBLE STUDY;  Surgeon: Hobart Powell BRAVO, MD;  Location: [facility] ENDOSCOPY;  Service: Cardiovascular;;   CARDIOVERSION N/A 02/11/2020   Procedure: CARDIOVERSION;  Surgeon: Kate Lonni CROME, MD;  Location: [facility] ENDOSCOPY;  Service: Cardiovascular;  Laterality: N/A;   CORONARY ARTERY BYPASS GRAFT     STUMP REVISION Left 09/09/2020   Procedure: REVISION LEFT ABOVE KNEE AMPUTATION;  Surgeon: Harden Jerona GAILS, MD;  Location: [facility] OR;  Service: Orthopedics;  Laterality: Left;   TEE WITHOUT CARDIOVERSION N/A 09/05/2020   Procedure: TRANSESOPHAGEAL ECHOCARDIOGRAM (TEE);  Surgeon: Hobart Powell BRAVO, MD;  Location: [facility] ENDOSCOPY;  Service: Cardiovascular;  Laterality: N/A;    Home Medications:  Allergies as of  08/10/2024   No Known Allergies      Medication List        Accurate as of August 10, 2024  3:22 PM. If you have any questions, ask your nurse or doctor.          acetaminophen  325 MG tablet Commonly known as: TYLENOL  Take 2 tablets (650 mg total) by mouth every 6 (six) hours as needed for moderate pain.   aspirin  EC 81 MG tablet Take 81 mg by mouth daily. Swallow whole.   atorvastatin  80 MG tablet Commonly known as: LIPITOR Take 1 tablet (80 mg total) by mouth daily.   Eliquis  5 MG Tabs tablet Generic drug: apixaban  TAKE 1 TABLET (5 MG TOTAL) BY MOUTH 2 (TWO) TIMES DAILY.   gabapentin  400 MG capsule Commonly known as: NEURONTIN  TAKE 1 CAPSULE (400 MG TOTAL) BY MOUTH THREE TIMES DAILY.   hydrALAZINE  50 MG tablet Commonly known as: APRESOLINE  Take 1 tablet by mouth in the morning, at noon, and at bedtime.   isosorbide  mononitrate 30 MG 24 hr tablet Commonly known as: IMDUR  Take 1 tablet (30 mg total) by mouth daily.   labetalol  200 MG tablet Commonly known as: NORMODYNE  TAKE 2 TABLETS (400 MG TOTAL) BY MOUTH TWO TIMES DAILY.   multivitamin with minerals tablet Take 1 tablet by mouth daily. Men   spironolactone  50 MG tablet Commonly known as: ALDACTONE  Take 1 tablet (50 mg total) by mouth daily.  Torsemide  40 MG Tabs Take 40 mg by mouth 2 (two) times daily for 5 days, THEN 40 mg daily. You'll need repeat labs within a week to ensure your kidney function and electrolytes are stable.  Weigh yourself daily.  If your weight is increasing by 2-3 lbs in a day or by 5 lbs in a week take an extra dose of torsemide  and call your PCP or cardiologist for additional instructions regarding your torsemide .. Start taking on: September 10, 2023   traZODone  100 MG tablet Commonly known as: DESYREL  Take 100 mg by mouth at bedtime.        Allergies: Allergies[1]  Family History: Family History  Problem Relation Age of Onset   CAD Mother    CVA Father     Social  History:  reports that he quit smoking about 12 months ago. His smoking use included cigarettes. He has never used smokeless tobacco. He reports that he does not currently use alcohol. He reports that he does not use drugs.   Physical Exam: There were no vitals taken for this visit.  Constitutional:  Alert and oriented, No acute distress. HEENT: [place] AT, moist mucus membranes.  Trachea midline, no masses. Cardiovascular: No clubbing, cyanosis, or edema. Respiratory: Normal respiratory effort, no increased work of breathing. GI: Abdomen is soft, nontender, nondistended, no abdominal masses GU: No CVA tenderness Skin: No rashes, bruises or suspicious lesions. Neurologic: Grossly intact, no focal deficits, moving all 4 extremities. Psychiatric: Normal mood and affect.  Laboratory Data: Lab Results  Component Value Date   WBC 5.3 09/10/2023   HGB 10.0 (L) 09/10/2023   HCT 33.5 (L) 09/10/2023   MCV 96.0 09/10/2023   PLT 185 09/10/2023    Lab Results  Component Value Date   CREATININE 1.38 (H) 01/22/2024    No results found for: PSA  No results found for: TESTOSTERONE  Lab Results  Component Value Date   HGBA1C 4.6 (L) 09/02/2020    Urinalysis    Component Value Date/Time   COLORURINE STRAW (A) 08/10/2023 1650   APPEARANCEUR CLEAR 08/10/2023 1650   LABSPEC 1.006 08/10/2023 1650   PHURINE 7.0 08/10/2023 1650   GLUCOSEU NEGATIVE 08/10/2023 1650   HGBUR SMALL (A) 08/10/2023 1650   BILIRUBINUR NEGATIVE 08/10/2023 1650   KETONESUR NEGATIVE 08/10/2023 1650   PROTEINUR NEGATIVE 08/10/2023 1650   NITRITE NEGATIVE 08/10/2023 1650   LEUKOCYTESUR NEGATIVE 08/10/2023 1650    Lab Results  Component Value Date   BACTERIA NONE SEEN 08/10/2023    Pertinent Imaging: N/a   Assessment & Plan:    PSA elevation - discussed again etiology - check PSA and then MRI. Also discussed possible for prostate biopsy and the nature r/b/a to bx. Would need clearance to stop eliquis  with  cardiology.   No follow-ups on file.  Donnice Brooks, MD  [facility]  [address] [place], [place] [address] [phone]      [1] No Known Allergies

## 2024-08-11 ENCOUNTER — Ambulatory Visit: Payer: Self-pay

## 2024-08-11 LAB — PSA: Prostate Specific Ag, Serum: 6.8 ng/mL — ABNORMAL HIGH (ref 0.0–4.0)

## 2024-08-11 NOTE — Telephone Encounter (Signed)
-----   Message from Donnice Brooks, MD sent at 08/11/2024  8:39 AM EST ----- Let Connor [name] know his PSA is stable or the same which is good that it hasn't gone up, but it's still elevated. I ordered an MRI scan of his prostate to check for prostate cancer and check his prostate  size. I'll let him know the results. Please schedule for MRI. Thank you.  ----- Message ----- From: Sammie Exie HERO, CMA Sent: 08/11/2024   7:53 AM EST To: Donnice Brooks, MD  Please review.

## 2024-08-11 NOTE — Telephone Encounter (Signed)
 Attempted to call pt to give results unable to lvm due to vm box being full cell phone number did allow a c/b number to be left vis text message

## 2024-08-11 NOTE — Addendum Note (Signed)
 Addended by: NIEVES COUGH R on: 08/11/2024 08:39 AM   Modules accepted: Orders

## 2025-01-11 ENCOUNTER — Ambulatory Visit: Admitting: Urology
